# Patient Record
Sex: Female | Born: 1955 | Race: White | Hispanic: No | Marital: Married | Smoking: Former smoker
Health system: Southern US, Community
[De-identification: ages and names within clinical notes are randomized; demographics above are authoritative.]

## PROBLEM LIST (undated history)

## (undated) DIAGNOSIS — J45909 Unspecified asthma, uncomplicated: Secondary | ICD-10-CM

## (undated) DIAGNOSIS — Z1239 Encounter for other screening for malignant neoplasm of breast: Secondary | ICD-10-CM

## (undated) DIAGNOSIS — E669 Obesity, unspecified: Secondary | ICD-10-CM

## (undated) DIAGNOSIS — Z1211 Encounter for screening for malignant neoplasm of colon: Secondary | ICD-10-CM

## (undated) DIAGNOSIS — D72829 Elevated white blood cell count, unspecified: Secondary | ICD-10-CM

## (undated) DIAGNOSIS — Z9889 Other specified postprocedural states: Secondary | ICD-10-CM

## (undated) DIAGNOSIS — R739 Hyperglycemia, unspecified: Secondary | ICD-10-CM

## (undated) DIAGNOSIS — F17201 Nicotine dependence, unspecified, in remission: Secondary | ICD-10-CM

## (undated) DIAGNOSIS — I10 Essential (primary) hypertension: Secondary | ICD-10-CM

## (undated) DIAGNOSIS — F32A Depression, unspecified: Secondary | ICD-10-CM

## (undated) DIAGNOSIS — E785 Hyperlipidemia, unspecified: Secondary | ICD-10-CM

## (undated) DIAGNOSIS — J449 Chronic obstructive pulmonary disease, unspecified: Secondary | ICD-10-CM

## (undated) DIAGNOSIS — F329 Major depressive disorder, single episode, unspecified: Secondary | ICD-10-CM

## (undated) DIAGNOSIS — F419 Anxiety disorder, unspecified: Secondary | ICD-10-CM

## (undated) DIAGNOSIS — K219 Gastro-esophageal reflux disease without esophagitis: Secondary | ICD-10-CM

## (undated) HISTORY — DX: Hyperlipidemia, unspecified: E78.5

## (undated) HISTORY — DX: Nicotine dependence, unspecified, in remission: F17.201

## (undated) HISTORY — DX: Chronic obstructive pulmonary disease, unspecified: J44.9

## (undated) HISTORY — PX: ABDOMINAL HYSTERECTOMY: SHX81

## (undated) HISTORY — DX: Depression, unspecified: F32.A

## (undated) HISTORY — DX: Essential (primary) hypertension: I10

## (undated) HISTORY — DX: Elevated white blood cell count, unspecified: D72.829

## (undated) HISTORY — DX: Major depressive disorder, single episode, unspecified: F32.9

## (undated) HISTORY — DX: Encounter for screening for malignant neoplasm of colon: Z12.11

## (undated) HISTORY — DX: Anxiety disorder, unspecified: F41.9

## (undated) HISTORY — PX: ABDOMINAL HYSTERECTOMY: SUR658

## (undated) HISTORY — PX: HEMORRHOID SURGERY: SHX153

## (undated) HISTORY — DX: Encounter for other screening for malignant neoplasm of breast: Z12.39

## (undated) HISTORY — DX: Other specified postprocedural states: Z98.890

## (undated) HISTORY — DX: Gastro-esophageal reflux disease without esophagitis: K21.9

## (undated) HISTORY — DX: Obesity, unspecified: E66.9

## (undated) HISTORY — DX: Hyperglycemia, unspecified: R73.9

---

## 1993-09-08 HISTORY — PX: BILATERAL OOPHORECTOMY: SHX1221

## 2005-08-22 ENCOUNTER — Ambulatory Visit: Payer: Self-pay | Admitting: General Surgery

## 2005-08-26 ENCOUNTER — Ambulatory Visit: Payer: Self-pay | Admitting: General Surgery

## 2006-06-17 ENCOUNTER — Observation Stay: Payer: Self-pay | Admitting: Internal Medicine

## 2006-06-17 ENCOUNTER — Other Ambulatory Visit: Payer: Self-pay

## 2006-07-02 ENCOUNTER — Ambulatory Visit: Payer: Self-pay | Admitting: Internal Medicine

## 2006-07-09 ENCOUNTER — Ambulatory Visit: Payer: Self-pay | Admitting: Internal Medicine

## 2006-07-21 ENCOUNTER — Emergency Department: Payer: Self-pay | Admitting: Emergency Medicine

## 2006-09-18 ENCOUNTER — Other Ambulatory Visit: Payer: Self-pay

## 2006-09-18 ENCOUNTER — Emergency Department: Payer: Self-pay | Admitting: Internal Medicine

## 2006-10-02 ENCOUNTER — Ambulatory Visit: Payer: Self-pay | Admitting: Internal Medicine

## 2007-10-14 ENCOUNTER — Ambulatory Visit: Payer: Self-pay | Admitting: Internal Medicine

## 2009-08-18 ENCOUNTER — Ambulatory Visit: Payer: Self-pay | Admitting: Internal Medicine

## 2009-12-29 ENCOUNTER — Emergency Department: Payer: Self-pay | Admitting: Emergency Medicine

## 2010-02-06 DIAGNOSIS — Z1211 Encounter for screening for malignant neoplasm of colon: Secondary | ICD-10-CM

## 2010-02-06 HISTORY — DX: Encounter for screening for malignant neoplasm of colon: Z12.11

## 2010-02-08 ENCOUNTER — Ambulatory Visit: Payer: Self-pay

## 2010-02-11 ENCOUNTER — Ambulatory Visit: Payer: Self-pay | Admitting: Unknown Physician Specialty

## 2010-02-12 ENCOUNTER — Ambulatory Visit: Payer: Self-pay | Admitting: Unknown Physician Specialty

## 2010-02-14 ENCOUNTER — Ambulatory Visit: Payer: Self-pay | Admitting: Unknown Physician Specialty

## 2010-02-21 ENCOUNTER — Ambulatory Visit: Payer: Self-pay | Admitting: Unknown Physician Specialty

## 2010-09-25 LAB — HM COLONOSCOPY

## 2010-11-07 DIAGNOSIS — Z9889 Other specified postprocedural states: Secondary | ICD-10-CM

## 2010-11-07 HISTORY — DX: Other specified postprocedural states: Z98.890

## 2010-11-21 ENCOUNTER — Observation Stay: Payer: Self-pay | Admitting: Internal Medicine

## 2010-11-21 ENCOUNTER — Encounter: Payer: Self-pay | Admitting: Cardiovascular Disease

## 2010-11-21 DIAGNOSIS — M79609 Pain in unspecified limb: Secondary | ICD-10-CM

## 2010-11-22 DIAGNOSIS — R079 Chest pain, unspecified: Secondary | ICD-10-CM

## 2010-11-22 HISTORY — PX: CARDIAC CATHETERIZATION: SHX172

## 2010-11-26 ENCOUNTER — Encounter: Payer: Self-pay | Admitting: Cardiovascular Disease

## 2010-12-02 ENCOUNTER — Ambulatory Visit (INDEPENDENT_AMBULATORY_CARE_PROVIDER_SITE_OTHER): Payer: 59 | Admitting: Cardiovascular Disease

## 2010-12-02 ENCOUNTER — Encounter: Payer: Self-pay | Admitting: Cardiovascular Disease

## 2010-12-02 DIAGNOSIS — I10 Essential (primary) hypertension: Secondary | ICD-10-CM | POA: Insufficient documentation

## 2010-12-02 DIAGNOSIS — I2510 Atherosclerotic heart disease of native coronary artery without angina pectoris: Secondary | ICD-10-CM | POA: Insufficient documentation

## 2010-12-02 DIAGNOSIS — F17200 Nicotine dependence, unspecified, uncomplicated: Secondary | ICD-10-CM | POA: Insufficient documentation

## 2010-12-02 DIAGNOSIS — E785 Hyperlipidemia, unspecified: Secondary | ICD-10-CM

## 2010-12-02 MED ORDER — ROSUVASTATIN CALCIUM 40 MG PO TABS: 40.0000 mg | ORAL_TABLET | Freq: Every day | ORAL | Status: DC

## 2010-12-02 NOTE — Patient Instructions (Signed)
Stop simvastatin when you run out of medication. Change to crestor 40 mg daily. Check cholesterol in three months. Follow up in clinic in 1 year or earlier if new issues arise.

## 2010-12-02 NOTE — Assessment & Plan Note (Signed)
She does have diffuse coronary artery disease. We have recommended smoking cessation, weight loss, exercise, improved lipid control.

## 2010-12-02 NOTE — Progress Notes (Signed)
   Patient ID: Catherine [name], female    DOB: 29-Dec-1955, 55 y.o.   MRN: [id number]  HPI Comments: Catherine [name] is a very pleasant 22 showed woman with COPD, hyperlipidemia, hypertension, long smoking history who works at [employer] who presented to the hospital with neck and shoulder pain, elevated cardiac enzymes, status post cardiac catheterization who presents for routine followup and to establish care in our office.  She had her cardiac catheterization last week while in the hospital on November 22 2010.  She had mild diffuse atherosclerotic disease with tapering of the mid to distal LAD from a moderate-sized vessel to a small vessel distally, she had several small to moderate sized OM vessels, ejection fraction 55%, no disease in the runoff of the right external iliac and right common femoral.  Since her discharge, she has felt well. She denies any chest pain. She does continue to have some chest and shoulder pain. She cannot take pain medication as she is "allergic".   EKG today shows normal sinus rhythm with rate 88 beats per minute, no significant ST or T wave changes.     Review of Systems  Constitutional: Negative.   HENT: Negative.   Eyes: Negative.   Respiratory: Negative.   Cardiovascular: Negative.   Gastrointestinal: Negative.   Musculoskeletal: Negative.   Skin: Negative.   Neurological: Negative.   Hematological: Negative.   Psychiatric/Behavioral: Negative.   All other systems reviewed and are negative.   BP 142/78  Pulse 88  Ht 5\' 3"  (1.6 m)  Wt 221 lb 6.4 oz (100.426 kg)  BMI 39.22 kg/m2    Physical Exam  Nursing note and vitals reviewed. Constitutional: She is oriented to person, place, and time. She appears well-developed and well-nourished.       Obese  HENT:  Head: Normocephalic.  Nose: Nose normal.  Mouth/Throat: Oropharynx is clear and moist.  Eyes: Conjunctivae are normal. Pupils are equal, round, and reactive to light.  Neck: Normal range of  motion. Neck supple. No JVD present.  Cardiovascular: Normal rate, regular rhythm, normal heart sounds and intact distal pulses.  Exam reveals no gallop and no friction rub.   No murmur heard. Pulmonary/Chest: Effort normal and breath sounds normal. No respiratory distress. She has no wheezes. She has no rales. She exhibits no tenderness.  Abdominal: Soft. Bowel sounds are normal. She exhibits no distension. There is no tenderness.  Musculoskeletal: Normal range of motion. She exhibits no edema and no tenderness.  Lymphadenopathy:    She has no cervical adenopathy.  Neurological: She is alert and oriented to person, place, and time. Coordination normal.  Skin: Skin is warm and dry. No rash noted. No erythema.  Psychiatric: She has a normal mood and affect. Her behavior is normal. Judgment and thought content normal.     Assessment and Plan

## 2010-12-02 NOTE — Assessment & Plan Note (Signed)
We have strongly recommended she quit smoking. She reports that her smoking patches will be covered by her insurance company.

## 2010-12-02 NOTE — Assessment & Plan Note (Signed)
I asked her to monitor her blood pressure. It is reasonably well controlled on today's visit. No medication changes were made

## 2010-12-02 NOTE — Assessment & Plan Note (Addendum)
Goal LDL should be less than 70 given her mild diffuse coronary artery disease and numerous risk factors. We have suggested she change to either Lipitor 80 mg or Crestor 40 mg. Lipitor appears to be a tier 3 medication for her whereas Crestor is a tier to medication.

## 2011-06-28 ENCOUNTER — Inpatient Hospital Stay: Payer: Self-pay | Admitting: Internal Medicine

## 2011-07-15 ENCOUNTER — Ambulatory Visit: Payer: Self-pay | Admitting: Internal Medicine

## 2011-07-28 ENCOUNTER — Ambulatory Visit: Payer: Self-pay | Admitting: General Practice

## 2011-07-28 DIAGNOSIS — Z1239 Encounter for other screening for malignant neoplasm of breast: Secondary | ICD-10-CM

## 2011-07-28 HISTORY — DX: Encounter for other screening for malignant neoplasm of breast: Z12.39

## 2011-08-01 ENCOUNTER — Ambulatory Visit (INDEPENDENT_AMBULATORY_CARE_PROVIDER_SITE_OTHER): Payer: 59 | Admitting: Internal Medicine

## 2011-08-01 ENCOUNTER — Encounter: Payer: Self-pay | Admitting: Internal Medicine

## 2011-08-01 VITALS — BP 148/82 | HR 93 | Temp 98.2°F | Resp 18 | Ht 63.0 in | Wt 227.5 lb

## 2011-08-01 DIAGNOSIS — Z1211 Encounter for screening for malignant neoplasm of colon: Secondary | ICD-10-CM | POA: Insufficient documentation

## 2011-08-01 DIAGNOSIS — J449 Chronic obstructive pulmonary disease, unspecified: Secondary | ICD-10-CM

## 2011-08-01 DIAGNOSIS — Z1239 Encounter for other screening for malignant neoplasm of breast: Secondary | ICD-10-CM | POA: Insufficient documentation

## 2011-08-01 DIAGNOSIS — K2990 Gastroduodenitis, unspecified, without bleeding: Secondary | ICD-10-CM | POA: Insufficient documentation

## 2011-08-01 DIAGNOSIS — F17201 Nicotine dependence, unspecified, in remission: Secondary | ICD-10-CM | POA: Insufficient documentation

## 2011-08-01 MED ORDER — METHYLPREDNISOLONE ACETATE 80 MG/ML IJ SUSP
80.0000 mg | Freq: Once | INTRAMUSCULAR | Status: AC
  Administered 2011-08-01: 80 mg via INTRAMUSCULAR

## 2011-08-01 MED ORDER — LEVOFLOXACIN 750 MG PO TABS: 750.0000 mg | ORAL_TABLET | Freq: Every day | ORAL | Status: DC

## 2011-08-01 MED ORDER — PREDNISONE (PAK) 10 MG PO TABS: ORAL_TABLET | ORAL | Status: DC

## 2011-08-01 MED ORDER — INSULIN ASPART 100 UNIT/ML ~~LOC~~ SOLN: 3.0000 [IU] | Freq: Three times a day (TID) | SUBCUTANEOUS | Status: DC

## 2011-08-01 MED ORDER — GLIPIZIDE 5 MG PO TABS: 5.0000 mg | ORAL_TABLET | Freq: Two times a day (BID) | ORAL | Status: DC

## 2011-08-01 MED ORDER — FLUCONAZOLE 150 MG PO TABS: 150.0000 mg | ORAL_TABLET | Freq: Once | ORAL | Status: DC

## 2011-08-01 NOTE — Patient Instructions (Addendum)
Increase glipizide to 5 mg to twice daily,  Add a dose before dinner.    Use the Novalog insulin before your meals using the following sliding scale:  For CBG 100 to 150:     No novolog  For cbg  151 to 200:     3 units novolog  For cbg 201 to 250:      6 units novolog   For cbg  251 to 300:     10 units novolog  For cbg 301 to 350:       15 units   You received 80 mg of solumedrol today.  Start the prednisone taper tomorrow.    I M going to prescribe Levaquin for your antibiotic.

## 2011-08-01 NOTE — Progress Notes (Signed)
Subjective:    Patient ID: Catherine [name], female    DOB: 02-Aug-1956, 55 y.o.   MRN: [id number]  HPI  Catherine [name] is a 55 yo  Employee of the [employer] [facility] who is transferring care from [employer].  She was recently hospitalized with acute respiratory failure secondary to COPD exacerbation and was discharge home on supplemental 02 for use with ambulation.  She is not wearing it today.  She has quit smoking since her hospitalization now takes Lantus for management of her diabetes mellitus due to significant elevation secondary to use of prednisone. Her home cbgs have been averaging 140-fasting and 150 post prandial.   For the last several days she has been more dyspneic than usual and has had a productive cough.  No fevers or chills, and no chest pain. Past Medical History  Diagnosis Date  . Diabetes mellitus   . Leukocytosis   . Hyperlipidemia   . COPD (chronic obstructive pulmonary disease)   . Hyperglycemia   . Hypertension   . S/P cardiac catheterization March 2012    normal coronaries,  due to chest pain ([facility])  . Tobacco abuse, in remission     quit oct during hospitalization   . Screening for colon cancer June 2011    polyps found, next one due 2014 ([facility])  . Gastritis and duodenitis June 2011    EGD  . Screening for breast cancer Jul 28 2011    normal   Current Outpatient Prescriptions on File Prior to Visit  Medication Sig Dispense Refill  . albuterol-ipratropium (COMBIVENT) 18-103 MCG/ACT inhaler Inhale 2 puffs into the lungs every 6 (six) hours as needed.        Marland Kitchen aspirin 81 MG EC tablet Take 81 mg by mouth daily.        Marland Kitchen ipratropium-albuterol (DUONEB) 0.5-2.5 (3) MG/3ML SOLN Take 3 mLs by nebulization every 4 (four) hours as needed.        .cur  Review of Systems  Constitutional: Negative for fever, chills and unexpected weight change.  HENT: Negative for hearing loss, ear pain, nosebleeds, congestion, sore throat, facial swelling, rhinorrhea, sneezing, mouth  sores, trouble swallowing, neck pain, neck stiffness, voice change, postnasal drip, sinus pressure, tinnitus and ear discharge.   Eyes: Negative for pain, discharge, redness and visual disturbance.  Respiratory: Positive for cough, chest tightness, shortness of breath and wheezing. Negative for stridor.   Cardiovascular: Negative for chest pain, palpitations and leg swelling.  Musculoskeletal: Negative for myalgias and arthralgias.  Skin: Negative for color change and rash.  Neurological: Negative for dizziness, weakness, light-headedness and headaches.  Hematological: Negative for adenopathy.   BP 148/82  Pulse 93  Temp(Src) 98.2 F (36.8 C) (Oral)  Resp 18  Ht 5\' 3"  (1.6 m)  Wt 227 lb 8 oz (103.193 kg)  BMI 40.30 kg/m2  SpO2 90%     Objective:   Physical Exam  Constitutional: She is oriented to person, place, and time. She appears well-developed and well-nourished.  HENT:  Mouth/Throat: Oropharynx is clear and moist.  Eyes: EOM are normal. Pupils are equal, round, and reactive to light. No scleral icterus.  Neck: Normal range of motion. Neck supple. No JVD present. No thyromegaly present.  Cardiovascular: Normal rate, regular rhythm, normal heart sounds and intact distal pulses.   Pulmonary/Chest: Effort normal. She has wheezes.  Abdominal: Soft. Bowel sounds are normal. She exhibits no mass. There is no tenderness.  Musculoskeletal: Normal range of motion. She exhibits no edema.  Lymphadenopathy:  She has no cervical adenopathy.  Neurological: She is alert and oriented to person, place, and time.  Skin: Skin is warm and dry.  Psychiatric: She has a normal mood and affect.       Assessment & Plan:  COPD exacerbation:  will treat empirically with levaquin, steroids, and supplemental 02. She will return next week for followup.  DM:  I expect her to have hyperglycemia with prednisone use and have given her a 70/30 Novolog flexpen with a sliding scale to use.   Tobacco  abuse:  She appears determined to remain abstinent given her recent hospitalization and her mother's history of respiratory failure.

## 2011-08-02 ENCOUNTER — Encounter: Payer: Self-pay | Admitting: Internal Medicine

## 2011-08-07 ENCOUNTER — Telehealth: Payer: Self-pay | Admitting: Internal Medicine

## 2011-08-07 ENCOUNTER — Encounter: Payer: Self-pay | Admitting: Internal Medicine

## 2011-08-07 ENCOUNTER — Ambulatory Visit (INDEPENDENT_AMBULATORY_CARE_PROVIDER_SITE_OTHER): Payer: 59 | Admitting: Internal Medicine

## 2011-08-07 DIAGNOSIS — IMO0001 Reserved for inherently not codable concepts without codable children: Secondary | ICD-10-CM

## 2011-08-07 DIAGNOSIS — E1165 Type 2 diabetes mellitus with hyperglycemia: Secondary | ICD-10-CM

## 2011-08-07 DIAGNOSIS — IMO0002 Reserved for concepts with insufficient information to code with codable children: Secondary | ICD-10-CM

## 2011-08-07 DIAGNOSIS — J441 Chronic obstructive pulmonary disease with (acute) exacerbation: Secondary | ICD-10-CM

## 2011-08-07 NOTE — Progress Notes (Signed)
Subjective:    Patient ID: Catherine [name], female    DOB: 1955-12-04, 55 y.o.   MRN: [id number]  HPI  Ms. Lordi returns for one week followup on COPD exacerbation which was  treated with levaquin and prednisone. She feels much better and has been compliant wtith all medicatiosn and constinues to refrain from tobacco use.  Her cbgs were elevated due to illness and sterid use, but have been improving and she is using sliding scale for cbgs > 200.  Past Medical History  Diagnosis Date  . Diabetes mellitus   . Leukocytosis   . Hyperlipidemia   . COPD (chronic obstructive pulmonary disease)   . Hyperglycemia   . Hypertension   . S/P cardiac catheterization March 2012    normal coronaries,  due to chest pain ([facility])  . Tobacco abuse, in remission     quit oct during hospitalization   . Screening for colon cancer June 2011    polyps found, next one due 2014 ([facility])  . Gastritis and duodenitis June 2011    EGD  . Screening for breast cancer Jul 28 2011    normal  . GERD (gastroesophageal reflux disease)   . Obesity (BMI 30-39.9)   . Depression   . Anxiety    Current Outpatient Prescriptions on File Prior to Visit  Medication Sig Dispense Refill  . albuterol-ipratropium (COMBIVENT) 18-103 MCG/ACT inhaler Inhale 2 puffs into the lungs every 6 (six) hours as needed.        . ALPRAZolam (XANAX) 0.25 MG tablet Take 0.25 mg by mouth at bedtime as needed.        Marland Kitchen aspirin 81 MG EC tablet Take 81 mg by mouth daily.        Marland Kitchen atorvastatin (LIPITOR) 40 MG tablet Take 40 mg by mouth daily.        . Fluticasone-Salmeterol (ADVAIR) 250-50 MCG/DOSE AEPB Inhale 1 puff into the lungs every 12 (twelve) hours.        Marland Kitchen glipiZIDE (GLUCOTROL) 5 MG tablet Take 1 tablet (5 mg total) by mouth 2 (two) times daily.  60 tablet  11  . insulin aspart (NOVOLOG) 100 UNIT/ML injection Inject 3 Units into the skin 3 (three) times daily before meals.  1 vial  12  . insulin glargine (LANTUS) 100 UNIT/ML  injection Inject 20 Units into the skin at bedtime.        Marland Kitchen ipratropium-albuterol (DUONEB) 0.5-2.5 (3) MG/3ML SOLN Take 3 mLs by nebulization every 4 (four) hours as needed.        . pantoprazole (PROTONIX) 40 MG tablet Take 40 mg by mouth daily.        Marland Kitchen PARoxetine (PAXIL) 10 MG tablet Take 10 mg by mouth every morning.        . theophylline (THEODUR) 200 MG 12 hr tablet Take 200 mg by mouth 2 (two) times daily.        Marland Kitchen tiotropium (SPIRIVA) 18 MCG inhalation capsule Place 18 mcg into inhaler and inhale daily.           Review of Systems  Constitutional: Negative for fever, chills and unexpected weight change.  HENT: Negative for hearing loss, ear pain, nosebleeds, congestion, sore throat, facial swelling, rhinorrhea, sneezing, mouth sores, trouble swallowing, neck pain, neck stiffness, voice change, postnasal drip, sinus pressure, tinnitus and ear discharge.   Eyes: Negative for pain, discharge, redness and visual disturbance.  Respiratory: Negative for cough, chest tightness, shortness of breath, wheezing and stridor.   Cardiovascular:  Negative for chest pain, palpitations and leg swelling.  Musculoskeletal: Negative for myalgias and arthralgias.  Skin: Negative for color change and rash.  Neurological: Negative for dizziness, weakness, light-headedness and headaches.  Hematological: Negative for adenopathy.   BP 124/78  Pulse 97  Temp(Src) 98.2 F (36.8 C) (Oral)  Resp 18  Ht 5\' 3"  (1.6 m)  Wt 223 lb 8 oz (101.379 kg)  BMI 39.59 kg/m2  SpO2 92%     Objective:   Physical Exam  Constitutional: She is oriented to person, place, and time. She appears well-developed and well-nourished.  HENT:  Mouth/Throat: Oropharynx is clear and moist.  Eyes: EOM are normal. Pupils are equal, round, and reactive to light. No scleral icterus.  Neck: Normal range of motion. Neck supple. No JVD present. No thyromegaly present.  Cardiovascular: Normal rate, regular rhythm, normal heart sounds and  intact distal pulses.   Pulmonary/Chest: Effort normal and breath sounds normal. She has no wheezes. She has no rales.  Abdominal: Soft. Bowel sounds are normal. She exhibits no mass. There is no tenderness.  Musculoskeletal: Normal range of motion. She exhibits no edema.  Lymphadenopathy:    She has no cervical adenopathy.  Neurological: She is alert and oriented to person, place, and time.  Skin: Skin is warm and dry.  Psychiatric: She has a normal mood and affect.       Assessment & Plan:   Diabetes mellitus type 2, uncontrolled Hgba1c was 9.3 during Oct 2012 hospitalization.  CBGS were improving at time of last visit with change in insulin regimen but have been elevated by recent  steroid use for treatment of COPD exacerbation.  Reviewed recent cbgs and new sliding scale designed with goal of adding oral meds to replace 70/30 eventually.  COPD exacerbation She was treated with levaquin and steorid taper last week when she presented with hypoxia, cough and mild wheezing.  Today's exam is considerably improved.     Updated Medication List Outpatient Encounter Prescriptions as of 08/07/2011  Medication Sig Dispense Refill  . albuterol-ipratropium (COMBIVENT) 18-103 MCG/ACT inhaler Inhale 2 puffs into the lungs every 6 (six) hours as needed.        . ALPRAZolam (XANAX) 0.25 MG tablet Take 0.25 mg by mouth at bedtime as needed.        Marland Kitchen aspirin 81 MG EC tablet Take 81 mg by mouth daily.        Marland Kitchen atorvastatin (LIPITOR) 40 MG tablet Take 40 mg by mouth daily.        . Fluticasone-Salmeterol (ADVAIR) 250-50 MCG/DOSE AEPB Inhale 1 puff into the lungs every 12 (twelve) hours.        Marland Kitchen glipiZIDE (GLUCOTROL) 5 MG tablet Take 1 tablet (5 mg total) by mouth 2 (two) times daily.  60 tablet  11  . insulin aspart (NOVOLOG) 100 UNIT/ML injection Inject 3 Units into the skin 3 (three) times daily before meals.  1 vial  12  . insulin glargine (LANTUS) 100 UNIT/ML injection Inject 20 Units into the  skin at bedtime.        Marland Kitchen ipratropium-albuterol (DUONEB) 0.5-2.5 (3) MG/3ML SOLN Take 3 mLs by nebulization every 4 (four) hours as needed.        . pantoprazole (PROTONIX) 40 MG tablet Take 40 mg by mouth daily.        Marland Kitchen PARoxetine (PAXIL) 10 MG tablet Take 10 mg by mouth every morning.        . theophylline (THEODUR) 200 MG 12 hr  tablet Take 200 mg by mouth 2 (two) times daily.        Marland Kitchen tiotropium (SPIRIVA) 18 MCG inhalation capsule Place 18 mcg into inhaler and inhale daily.        Marland Kitchen DISCONTD: glipiZIDE (GLUCOTROL) 5 MG tablet Take 5 mg by mouth 2 (two) times daily before a meal.        . DISCONTD: levofloxacin (LEVAQUIN) 750 MG tablet Take 1 tablet (750 mg total) by mouth daily.  7 tablet  0  . DISCONTD: predniSONE (STERAPRED UNI-PAK) 10 MG tablet 6 tablets on Day 1, decrease by 1 tablet daily until gone  21 tablet  0

## 2011-08-07 NOTE — Patient Instructions (Signed)
Start taking the glipizide twice daily 5 mg before meals.  Continue the 20 units of lantus  For cbg 150 to 200:  No insulin For cbg 201 to 250:  3 units

## 2011-08-07 NOTE — Telephone Encounter (Signed)
FAX # for FMLA paperwork [phone]

## 2011-08-09 ENCOUNTER — Ambulatory Visit: Payer: Self-pay | Admitting: Internal Medicine

## 2011-08-10 ENCOUNTER — Encounter: Payer: Self-pay | Admitting: Internal Medicine

## 2011-08-10 DIAGNOSIS — E669 Obesity, unspecified: Secondary | ICD-10-CM | POA: Insufficient documentation

## 2011-08-10 DIAGNOSIS — IMO0002 Reserved for concepts with insufficient information to code with codable children: Secondary | ICD-10-CM | POA: Insufficient documentation

## 2011-08-10 DIAGNOSIS — E1165 Type 2 diabetes mellitus with hyperglycemia: Secondary | ICD-10-CM | POA: Insufficient documentation

## 2011-08-10 DIAGNOSIS — J441 Chronic obstructive pulmonary disease with (acute) exacerbation: Secondary | ICD-10-CM | POA: Insufficient documentation

## 2011-08-10 DIAGNOSIS — F419 Anxiety disorder, unspecified: Secondary | ICD-10-CM | POA: Insufficient documentation

## 2011-08-10 DIAGNOSIS — F329 Major depressive disorder, single episode, unspecified: Secondary | ICD-10-CM | POA: Insufficient documentation

## 2011-08-10 DIAGNOSIS — K219 Gastro-esophageal reflux disease without esophagitis: Secondary | ICD-10-CM | POA: Insufficient documentation

## 2011-08-10 HISTORY — DX: Chronic obstructive pulmonary disease with (acute) exacerbation: J44.1

## 2011-08-10 NOTE — Assessment & Plan Note (Signed)
Hgba1c was 9.3 during Oct 2012 hospitalization.  CBGS were improving at time of last visit with change in insulin regimen but have been elevated by recent  steroid use for treatment of COPD exacerbation.  Reviewed recent cbgs and new sliding scale designed with goal of adding oral meds to replace 70/30 eventually.

## 2011-08-10 NOTE — Assessment & Plan Note (Signed)
She was treated with levaquin and steorid taper last week when she presented with hypoxia, cough and mild wheezing.  Today's exam is considerably improved.

## 2011-08-12 NOTE — Telephone Encounter (Signed)
I have the fax # on the paperwork

## 2011-08-13 ENCOUNTER — Ambulatory Visit: Payer: 59 | Admitting: Internal Medicine

## 2011-08-15 DIAGNOSIS — Z0279 Encounter for issue of other medical certificate: Secondary | ICD-10-CM

## 2011-09-09 ENCOUNTER — Ambulatory Visit: Payer: Self-pay | Admitting: Internal Medicine

## 2011-09-12 ENCOUNTER — Emergency Department: Payer: Self-pay | Admitting: Emergency Medicine

## 2011-09-12 LAB — COMPREHENSIVE METABOLIC PANEL
Anion Gap: 11 (ref 7–16)
Calcium, Total: 8.5 mg/dL (ref 8.5–10.1)
Chloride: 109 mmol/L — ABNORMAL HIGH (ref 98–107)
EGFR (African American): >60
Glucose: 149 mg/dL — ABNORMAL HIGH (ref 65–99)
Osmolality: 293 (ref 275–301)
Potassium: 3.9 mmol/L (ref 3.5–5.1)
SGOT(AST): 24 U/L (ref 15–37)
SGPT (ALT): 20 U/L
Sodium: 146 mmol/L — ABNORMAL HIGH (ref 136–145)

## 2011-09-12 LAB — CBC WITH DIFFERENTIAL/PLATELET
Basophil %: 0.5 %
Eosinophil #: 0.2 10*3/uL (ref 0.0–0.7)
Eosinophil %: 2.4 %
HCT: 40 % (ref 35.0–47.0)
Lymphocyte #: 3.1 10*3/uL (ref 1.0–3.6)
MCHC: 33.9 g/dL (ref 32.0–36.0)
MCV: 89 fL (ref 80–100)
Monocyte #: 0.5 10*3/uL (ref 0.0–0.7)
Monocyte %: 5.6 %
Neutrophil #: 5.9 10*3/uL (ref 1.4–6.5)
RBC: 4.52 10*6/uL (ref 3.80–5.20)
WBC: 9.8 10*3/uL (ref 3.6–11.0)

## 2011-09-12 LAB — TROPONIN I: Troponin-I: <0.02 ng/mL

## 2011-10-03 ENCOUNTER — Other Ambulatory Visit: Payer: Self-pay | Admitting: *Deleted

## 2011-10-03 MED ORDER — GLUCOSE BLOOD VI STRP: ORAL_STRIP | Status: DC

## 2011-10-03 MED ORDER — TIOTROPIUM BROMIDE MONOHYDRATE 18 MCG IN CAPS: 18.0000 ug | ORAL_CAPSULE | Freq: Every day | RESPIRATORY_TRACT | Status: DC

## 2011-11-07 ENCOUNTER — Encounter: Payer: Self-pay | Admitting: Internal Medicine

## 2011-11-07 ENCOUNTER — Ambulatory Visit (INDEPENDENT_AMBULATORY_CARE_PROVIDER_SITE_OTHER): Payer: PRIVATE HEALTH INSURANCE | Admitting: Internal Medicine

## 2011-11-07 DIAGNOSIS — J441 Chronic obstructive pulmonary disease with (acute) exacerbation: Secondary | ICD-10-CM

## 2011-11-07 DIAGNOSIS — E1165 Type 2 diabetes mellitus with hyperglycemia: Secondary | ICD-10-CM | POA: Insufficient documentation

## 2011-11-07 DIAGNOSIS — I2510 Atherosclerotic heart disease of native coronary artery without angina pectoris: Secondary | ICD-10-CM

## 2011-11-07 DIAGNOSIS — IMO0002 Reserved for concepts with insufficient information to code with codable children: Secondary | ICD-10-CM | POA: Insufficient documentation

## 2011-11-07 DIAGNOSIS — E118 Type 2 diabetes mellitus with unspecified complications: Secondary | ICD-10-CM

## 2011-11-07 DIAGNOSIS — J449 Chronic obstructive pulmonary disease, unspecified: Secondary | ICD-10-CM

## 2011-11-07 MED ORDER — AZITHROMYCIN 500 MG PO TABS: 500.0000 mg | ORAL_TABLET | Freq: Every day | ORAL | Status: AC

## 2011-11-07 MED ORDER — PREDNISONE (PAK) 10 MG PO TABS: ORAL_TABLET | ORAL | Status: AC

## 2011-11-07 MED ORDER — ALPRAZOLAM 0.25 MG PO TABS: 0.2500 mg | ORAL_TABLET | Freq: Every evening | ORAL | Status: DC | PRN

## 2011-11-07 NOTE — Assessment & Plan Note (Addendum)
Now congtrolled, hgba1c < 7.0  On Lantus 20 units and glipizide 5 mg bid.  No changes today. Continue statin , She is up to  Date in eye exams

## 2011-11-07 NOTE — Assessment & Plan Note (Addendum)
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  Nonproductive cough,  Afebrile  Wheezing only with forced exhalation.  Prednisone taper  And  azithromycin .  Referring to Dr. Kendrick Fries for pulmonology evaluation per patient request.

## 2011-11-07 NOTE — Progress Notes (Signed)
Subjective:    Patient ID: Catherine [name], female    DOB: [birth year], 56 y.o.   MRN: [id number]  HPI  Catherine [name] is a 56 yr old white female with a  History of DM, hyperlipidemia and COPD who returns for follow up on chronic conditions .  Her diabetes is much better controlled on current regimen. She denies any recent lows using lantus 20 and glipizide 5 mg bid .  She has been more short of breath,  depite using advair twice daily and spiriva.  She apparently had PFTS which were not completed and other tests by  [facility] have still not been resulted to patient (alpha 1 antitrypsin was drawn months ago ) .  Very dissatisfied.,  Having occasional episodes of chest pressure with no other associated sympotms occurring at rest.  Gave up smoking 5 months ago.   Increased cough without sputum for a few weeks,  Using albuterol every 4 hours.    Past Medical History  Diagnosis Date  . Diabetes mellitus   . Leukocytosis   . Hyperlipidemia   . COPD (chronic obstructive pulmonary disease)   . Hyperglycemia   . Hypertension   . S/P cardiac catheterization March 2012    normal coronaries,  due to chest pain ([facility])  . Tobacco abuse, in remission     quit oct during hospitalization   . Screening for colon cancer June 2011    polyps found, next one due 2014 ([facility])  . Gastritis and duodenitis June 2011    EGD  . Screening for breast cancer Jul 28 2011    normal  . GERD (gastroesophageal reflux disease)   . Obesity (BMI 30-39.9)   . Depression   . Anxiety    Current Outpatient Prescriptions on File Prior to Visit  Medication Sig Dispense Refill  . albuterol-ipratropium (COMBIVENT) 18-103 MCG/ACT inhaler Inhale 2 puffs into the lungs every 6 (six) hours as needed.        Marland Kitchen aspirin 81 MG EC tablet Take 81 mg by mouth daily.        Marland Kitchen atorvastatin (LIPITOR) 40 MG tablet Take 40 mg by mouth daily.        . Fluticasone-Salmeterol (ADVAIR) 250-50 MCG/DOSE AEPB Inhale 1 puff into the lungs every 12  (twelve) hours.        Marland Kitchen glipiZIDE (GLUCOTROL) 5 MG tablet Take 1 tablet (5 mg total) by mouth 2 (two) times daily.  60 tablet  11  . glucose blood (ACCU-CHEK SMARTVIEW) test strip Use as instructed  150 each  11  . insulin glargine (LANTUS) 100 UNIT/ML injection Inject 20 Units into the skin daily.       Marland Kitchen ipratropium-albuterol (DUONEB) 0.5-2.5 (3) MG/3ML SOLN Take 3 mLs by nebulization every 4 (four) hours as needed.        . pantoprazole (PROTONIX) 40 MG tablet Take 40 mg by mouth daily.        Marland Kitchen PARoxetine (PAXIL) 10 MG tablet Take 10 mg by mouth every morning.        . tiotropium (SPIRIVA) 18 MCG inhalation capsule Place 1 capsule (18 mcg total) into inhaler and inhale daily.  30 capsule  3    Review of Systems  Constitutional: Negative for fever, chills and unexpected weight change.  HENT: Negative for hearing loss, ear pain, nosebleeds, congestion, sore throat, facial swelling, rhinorrhea, sneezing, mouth sores, trouble swallowing, neck pain, neck stiffness, voice change, postnasal drip, sinus pressure, tinnitus and ear discharge.   Eyes: Negative  for pain, discharge, redness and visual disturbance.  Respiratory: Negative for cough, chest tightness, shortness of breath, wheezing and stridor.   Cardiovascular: Negative for chest pain, palpitations and leg swelling.  Musculoskeletal: Negative for myalgias and arthralgias.  Skin: Negative for color change and rash.  Neurological: Negative for dizziness, weakness, light-headedness and headaches.  Hematological: Negative for adenopathy.       Objective:   Physical Exam  Constitutional: She is oriented to person, place, and time. She appears well-developed and well-nourished.  HENT:  Mouth/Throat: Oropharynx is clear and moist.  Eyes: EOM are normal. Pupils are equal, round, and reactive to light. No scleral icterus.  Neck: Normal range of motion. Neck supple. No JVD present. No thyromegaly present.  Cardiovascular: Normal rate,  regular rhythm, normal heart sounds and intact distal pulses.   Pulmonary/Chest: Effort normal. She has wheezes.  Abdominal: Soft. Bowel sounds are normal. She exhibits no mass. There is no tenderness.  Musculoskeletal: Normal range of motion. She exhibits no edema.  Lymphadenopathy:    She has no cervical adenopathy.  Neurological: She is alert and oriented to person, place, and time.  Skin: Skin is warm and dry.  Psychiatric: She has a normal mood and affect.      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  Nonproductive cough,  Afebrile  Wheezing only with forced exhalation.  Prednisone taper  And  azithromycin .  Referring to Dr. Kendrick Fries for pulmonology evaluation per patient request.  Diabetes mellitus with complication Now congtrolled, hgba1c < 7.0  On Lantus 20 units and glipizide 5 mg bid.  No changes today. Continue statin , She is up to  Date in eye exams    Updated Medication List Outpatient Encounter Prescriptions as of 11/07/2011  Medication Sig Dispense Refill  . albuterol-ipratropium (COMBIVENT) 18-103 MCG/ACT inhaler Inhale 2 puffs into the lungs every 6 (six) hours as needed.        Marland Kitchen aspirin 81 MG EC tablet Take 81 mg by mouth daily.        Marland Kitchen atorvastatin (LIPITOR) 40 MG tablet Take 40 mg by mouth daily.        . Fluticasone-Salmeterol (ADVAIR) 250-50 MCG/DOSE AEPB Inhale 1 puff into the lungs every 12 (twelve) hours.        Marland Kitchen glipiZIDE (GLUCOTROL) 5 MG tablet Take 1 tablet (5 mg total) by mouth 2 (two) times daily.  60 tablet  11  . glucose blood (ACCU-CHEK SMARTVIEW) test strip Use as instructed  150 each  11  . insulin glargine (LANTUS) 100 UNIT/ML injection Inject 20 Units into the skin daily.       Marland Kitchen ipratropium-albuterol (DUONEB) 0.5-2.5 (3) MG/3ML SOLN Take 3 mLs by nebulization every 4 (four) hours as needed.        . pantoprazole (PROTONIX) 40 MG tablet Take 40 mg by mouth daily.        Marland Kitchen PARoxetine (PAXIL) 10 MG tablet  Take 10 mg by mouth every morning.        . tiotropium (SPIRIVA) 18 MCG inhalation capsule Place 1 capsule (18 mcg total) into inhaler and inhale daily.  30 capsule  3  . ALPRAZolam (XANAX) 0.25 MG tablet Take 1 tablet (0.25 mg total) by mouth at bedtime as needed.  30 tablet  2  . azithromycin (ZITHROMAX) 500 MG tablet Take 1 tablet (500 mg total) by mouth daily.  7 tablet  0  . predniSONE (STERAPRED UNI-PAK) 10 MG  tablet 6 tablets on Day 1 , then reduce by 1 tablet daily until gone  21 tablet  0  . DISCONTD: ALPRAZolam (XANAX) 0.25 MG tablet Take 0.25 mg by mouth at bedtime as needed.        Marland Kitchen DISCONTD: insulin aspart (NOVOLOG) 100 UNIT/ML injection Inject 3 Units into the skin 3 (three) times daily before meals.  1 vial  12  . DISCONTD: theophylline (THEODUR) 200 MG 12 hr tablet Take 200 mg by mouth 2 (two) times daily.

## 2011-11-07 NOTE — Patient Instructions (Addendum)
While you are on the prednisone,  You can use th enovlog flex pen on a sliding scale for high sugars:  150 to 200   3 units 201 to 250   5 units 251 to 300   8 units  Higher than that call me  Prednisone taper and azithromycin for 6 days   Referral to [facility] under way  We'll make appt to see Gollan  Fasting labs on Tuesday

## 2011-11-09 ENCOUNTER — Encounter: Payer: Self-pay | Admitting: Internal Medicine

## 2011-11-10 ENCOUNTER — Institutional Professional Consult (permissible substitution): Payer: PRIVATE HEALTH INSURANCE | Admitting: Pulmonary Disease

## 2011-11-11 ENCOUNTER — Telehealth: Payer: Self-pay | Admitting: Internal Medicine

## 2011-11-11 ENCOUNTER — Other Ambulatory Visit: Payer: Self-pay | Admitting: Internal Medicine

## 2011-11-11 LAB — CBC WITH DIFFERENTIAL/PLATELET
Basophil #: 0 10*3/uL (ref 0.0–0.1)
Basophil %: 0.1 %
Eosinophil #: 0 10*3/uL (ref 0.0–0.7)
HGB: 14.3 g/dL (ref 12.0–16.0)
Lymphocyte %: 12.7 %
MCH: 28 pg (ref 26.0–34.0)
MCHC: 32.2 g/dL (ref 32.0–36.0)
Monocyte #: 0.2 10*3/uL (ref 0.0–0.7)
Neutrophil %: 85.3 %
Platelet: 340 10*3/uL (ref 150–440)
RBC: 5.11 10*6/uL (ref 3.80–5.20)
RDW: 11.9 % (ref 11.5–14.5)
WBC: 11.4 10*3/uL — ABNORMAL HIGH (ref 3.6–11.0)

## 2011-11-11 LAB — COMPREHENSIVE METABOLIC PANEL
Albumin: 3.8 g/dL (ref 3.4–5.0)
Alkaline Phosphatase: 135 U/L (ref 50–136)
Anion Gap: 10 (ref 7–16)
BUN: 15 mg/dL (ref 7–18)
Bilirubin,Total: 0.5 mg/dL (ref 0.2–1.0)
Chloride: 105 mmol/L (ref 98–107)
Creatinine: 0.89 mg/dL (ref 0.60–1.30)
EGFR (Non-African Amer.): >60
Glucose: 153 mg/dL — ABNORMAL HIGH (ref 65–99)
Osmolality: 283 (ref 275–301)
Potassium: 3.8 mmol/L (ref 3.5–5.1)
Sodium: 140 mmol/L (ref 136–145)
Total Protein: 7.9 g/dL (ref 6.4–8.2)

## 2011-11-11 LAB — LIPID PANEL
HDL Cholesterol: 47 mg/dL (ref 40–60)
Triglycerides: 166 mg/dL (ref 0–200)

## 2011-11-11 LAB — TSH: Thyroid Stimulating Horm: 0.312 u[IU]/mL — ABNORMAL LOW

## 2011-11-11 NOTE — Telephone Encounter (Signed)
h [phone]  Cell [phone] Pt came in today wanting to know The diabetes nurse was wondering if she should be put back on the ophylline for COPD since she is having a such a hard time breathing.  Also the xanax was not called in from 11/07/11 appointment  [facility] [phone]

## 2011-11-12 NOTE — Telephone Encounter (Signed)
Rx has been called in  

## 2011-11-12 NOTE — Telephone Encounter (Signed)
I would  Prefer not to start theophylline when she is about to see Dr Kendrick Fries as a new patient.  Has she received a call about an  appt with him yet?  The referral was placed on March 1

## 2011-11-13 ENCOUNTER — Telehealth: Payer: Self-pay | Admitting: Internal Medicine

## 2011-11-13 MED ORDER — ATORVASTATIN CALCIUM 80 MG PO TABS: 80.0000 mg | ORAL_TABLET | Freq: Every day | ORAL | Status: DC

## 2011-11-13 NOTE — Telephone Encounter (Signed)
Notified patient of results.  She stated she just had her HgbA1c done last month and it was 6.9  She stated if you wanted to see the actual report she would get the hospital to send you a copy.  Please advise

## 2011-11-13 NOTE — Telephone Encounter (Signed)
Her liver/kideny function were fine,  White count elevated, consistent with infection.  Cholesterol was a little elevated.  No hgba1c was done can she go back and get that done?  We will increase the atorvastatin to 80 mg to improve  the LDL of 105 to goal of 70

## 2011-11-13 NOTE — Telephone Encounter (Signed)
Notified patient of message, she has not received an appt with Dr. Kendrick Fries yet but I told her Catherine [name] is working on it. She is ok with this.

## 2011-11-13 NOTE — Telephone Encounter (Signed)
That would be nice, if the hosptial woudn send [place] a copy .Marland Kitchen But I believe her and will update chart.

## 2011-11-14 ENCOUNTER — Other Ambulatory Visit: Payer: Self-pay | Admitting: *Deleted

## 2011-11-14 MED ORDER — INSULIN GLARGINE 100 UNIT/ML ~~LOC~~ SOLN: 20.0000 [IU] | Freq: Every day | SUBCUTANEOUS | Status: DC

## 2011-11-25 ENCOUNTER — Ambulatory Visit (INDEPENDENT_AMBULATORY_CARE_PROVIDER_SITE_OTHER): Payer: PRIVATE HEALTH INSURANCE | Admitting: Internal Medicine

## 2011-11-25 ENCOUNTER — Encounter: Payer: Self-pay | Admitting: Internal Medicine

## 2011-11-25 VITALS — BP 130/78 | HR 92 | Temp 98.6°F | Wt 229.1 lb

## 2011-11-25 DIAGNOSIS — J441 Chronic obstructive pulmonary disease with (acute) exacerbation: Secondary | ICD-10-CM

## 2011-11-25 DIAGNOSIS — J069 Acute upper respiratory infection, unspecified: Secondary | ICD-10-CM

## 2011-11-25 MED ORDER — METHYLPREDNISOLONE ACETATE 40 MG/ML IJ SUSP
40.0000 mg | Freq: Once | INTRAMUSCULAR | Status: AC
  Administered 2011-11-25: 40 mg via INTRAMUSCULAR

## 2011-11-25 MED ORDER — PROMETHAZINE-PHENYLEPHRINE 6.25-5 MG/5ML PO SYRP: 5.0000 mL | ORAL_SOLUTION | ORAL | Status: DC | PRN

## 2011-11-25 MED ORDER — LEVOFLOXACIN 500 MG PO TABS: 500.0000 mg | ORAL_TABLET | Freq: Every day | ORAL | Status: AC

## 2011-11-25 NOTE — Assessment & Plan Note (Addendum)
She was treated 2 weeks ago with prednisone and azithromycin and her symptoms completely resolved for recurrence of symptoms 3 days ago it is indicative of a viral infection. She is not wheezing on exam except with forced exhalation. I would like to try to do without prednisone since she is not actively wheezing except with forced expiration. Dose of IM Depo-Medrol and we'll treat her for upper respiratory virus infection saline lavage Benadryl and Sudafed PE. We'll cover her empirically for her with Levaquin given her a significant COPD history. Dose of IM Depo-Medrol 40 mg was tolerated well .  She will continue use of home nebs and dilators as previously recommended

## 2011-11-25 NOTE — Patient Instructions (Addendum)
You have a viral  Syndrome .  The post nasal drip is causing your sore throat.  Lavage your sinuses twice daily with Simply saline nasal spray.(Do over the sink,, inhale deepply through eadh nostril while spraying )  Use benadryl 25 mg every 8 hours and Sudafed PE 10 to 30 every 8 hours to manage the drainage and congestion.  Gargle with salt water often for the sore throat.  I am calling in a cough syrup (without codeine) for the cough.  Levaquin once daily for 7 days  to cover any bacterial infection

## 2011-11-25 NOTE — Progress Notes (Signed)
Patient ID: Catherine [name], female   DOB: 1955-10-05, 56 y.o.   MRN: [id number]    Patient Active Problem List  Diagnoses  . CAD (coronary artery disease)  . HTN (hypertension)  . Hyperlipidemia  . Tobacco abuse, in remission  . Screening for colon cancer  . Gastritis and duodenitis  . Screening for breast cancer  . GERD (gastroesophageal reflux disease)  . Obesity (BMI 30-39.9)  . Depression  . Anxiety  . COPD exacerbation  . Diabetes mellitus with complication    Subjective:  CC:   Chief Complaint  Patient presents with  . Cough    congestion SOB    HPI:   Catherine Lish Whiteheadis a 56 y.o. female who presents with Recurrent cough,  sore throat,  malaise and shortness of breath.   Catherine [name] was last seen on March 1 at which time she was having a COPD exacerbation and was treated with prednisone and azithromycin. Her symptoms resolved and she will return to work. Within one week return to work she began having nausea without vomiting scratchy throat and chest pain with coughing. She denies any fevers. She has noted some shortness of breath with exertion but no wheezing. She has been using her albuterol nebs up to 4 times daily.    Past Medical History  Diagnosis Date  . Diabetes mellitus   . Leukocytosis   . Hyperlipidemia   . COPD (chronic obstructive pulmonary disease)   . Hyperglycemia   . Hypertension   . S/P cardiac catheterization March 2012    normal coronaries,  due to chest pain (Gollan)  . Tobacco abuse, in remission     quit oct during hospitalization   . Screening for colon cancer June 2011    polyps found, next one due 2014 ([facility])  . Gastritis and duodenitis June 2011    EGD  . Screening for breast cancer Jul 28 2011    normal  . GERD (gastroesophageal reflux disease)   . Obesity (BMI 30-39.9)   . Depression   . Anxiety     Past Surgical History  Procedure Date  . Cardiac catheterization 11/22/2010  . Abdominal hysterectomy   .  Hemorrhoid surgery   . Abdominal hysterectomy   . Bilateral oophorectomy 1995    and TAH.  noncancerous reasons       . methylPREDNISolone acetate  40 mg Intramuscular Once     The following portions of the patient's history were reviewed and updated as appropriate: Allergies, current medications, and problem list.    Review of Systems:   12 Pt  review of systems was negative except those addressed in the HPI,     History   Social History  . Marital Status: Married    Spouse Name: N/A    Number of Children: N/A  . Years of Education: N/A   Occupational History  . Not on file.   Social History Main Topics  . Smoking status: Former Smoker -- 40 years    Types: Cigarettes    Quit date: 06/27/2011  . Smokeless tobacco: Never Used  . Alcohol Use: No  . Drug Use: No  . Sexually Active: Not on file   Other Topics Concern  . Not on file   Social History Narrative  . No narrative on file    Objective:  BP 130/78  Pulse 92  Temp(Src) 98.6 F (37 C) (Oral)  Wt 229 lb 1.9 oz (103.928 kg)  SpO2 96%  General appearance: alert, cooperative  and appears stated age Ears: normal TM's and external ear canals both ears Throat: lips, mucosa, and tongue normal; teeth and gums normal Neck: no adenopathy, no carotid bruit, supple, symmetrical, trachea midline and thyroid not enlarged, symmetric, no tenderness/mass/nodules Back: symmetric, no curvature. ROM normal. No CVA tenderness. Lungs: clear to auscultation bilaterally Heart: regular rate and rhythm, S1, S2 normal, no murmur, click, rub or gallop Abdomen: soft, non-tender; bowel sounds normal; no masses,  no organomegaly Pulses: 2+ and symmetric Skin: Skin color, texture, turgor normal. No rashes or lesions Lymph nodes: Cervical, supraclavicular, and axillary nodes normal.  Assessment and Plan:  COPD exacerbation She was treated 2 weeks ago with prednisone and azithromycin and her symptoms completely resolved for  recurrence of symptoms 3 days ago it is indicative of a viral infection. She is not wheezing on exam except with forced exhalation. I would like to try to do without prednisone since she is not actively wheezing except with forced expiration. Dose of IM Depo-Medrol and we'll treat her for upper respiratory virus infection saline lavage Benadryl and Sudafed PE. We'll cover her empirically for her with Levaquin given her a significant COPD history. Dose of IM Depo-Medrol 40 mg was tolerated well .  She will continue use of home nebs and dilators as previously recommended    Updated Medication List Outpatient Encounter Prescriptions as of 11/25/2011  Medication Sig Dispense Refill  . albuterol-ipratropium (COMBIVENT) 18-103 MCG/ACT inhaler Inhale 2 puffs into the lungs every 6 (six) hours as needed.        . ALPRAZolam (XANAX) 0.25 MG tablet Take 1 tablet (0.25 mg total) by mouth at bedtime as needed.  30 tablet  2  . aspirin 81 MG EC tablet Take 81 mg by mouth daily.        Marland Kitchen atorvastatin (LIPITOR) 80 MG tablet Take 1 tablet (80 mg total) by mouth daily.  30 tablet  11  . Fluticasone-Salmeterol (ADVAIR) 250-50 MCG/DOSE AEPB Inhale 1 puff into the lungs every 12 (twelve) hours.        Marland Kitchen glipiZIDE (GLUCOTROL) 5 MG tablet Take 1 tablet (5 mg total) by mouth 2 (two) times daily.  60 tablet  11  . glucose blood (ACCU-CHEK SMARTVIEW) test strip Use as instructed  150 each  11  . insulin glargine (LANTUS) 100 UNIT/ML injection Inject 20 Units into the skin daily.  10 mL  5  . ipratropium-albuterol (DUONEB) 0.5-2.5 (3) MG/3ML SOLN Take 3 mLs by nebulization every 4 (four) hours as needed.        . pantoprazole (PROTONIX) 40 MG tablet Take 40 mg by mouth daily.        Marland Kitchen PARoxetine (PAXIL) 10 MG tablet Take 10 mg by mouth every morning.        . tiotropium (SPIRIVA) 18 MCG inhalation capsule Place 1 capsule (18 mcg total) into inhaler and inhale daily.  30 capsule  3  . levofloxacin (LEVAQUIN) 500 MG tablet  Take 1 tablet (500 mg total) by mouth daily.  7 tablet  0  . promethazine-phenylephrine (PROMETHAZINE-PHENYLEPHRINE) 6.25-5 MG/5ML SYRP Take 5 mLs by mouth every 4 (four) hours as needed.  280 mL  0   Facility-Administered Encounter Medications as of 11/25/2011  Medication Dose Route Frequency Provider Last Rate Last Dose  . methylPREDNISolone acetate (DEPO-MEDROL) injection 40 mg  40 mg Intramuscular Once Sherlene Shams, MD   40 mg at 11/25/11 1151     No orders of the defined types were placed in  this encounter.    No Follow-up on file.

## 2011-11-27 ENCOUNTER — Encounter: Payer: Self-pay | Admitting: *Deleted

## 2011-12-01 ENCOUNTER — Encounter: Payer: Self-pay | Admitting: Cardiovascular Disease

## 2011-12-01 ENCOUNTER — Ambulatory Visit (INDEPENDENT_AMBULATORY_CARE_PROVIDER_SITE_OTHER): Payer: PRIVATE HEALTH INSURANCE | Admitting: Cardiovascular Disease

## 2011-12-01 VITALS — BP 137/86 | HR 90 | Ht 63.0 in | Wt 228.0 lb

## 2011-12-01 DIAGNOSIS — E785 Hyperlipidemia, unspecified: Secondary | ICD-10-CM

## 2011-12-01 DIAGNOSIS — E118 Type 2 diabetes mellitus with unspecified complications: Secondary | ICD-10-CM

## 2011-12-01 DIAGNOSIS — I2510 Atherosclerotic heart disease of native coronary artery without angina pectoris: Secondary | ICD-10-CM

## 2011-12-01 DIAGNOSIS — I10 Essential (primary) hypertension: Secondary | ICD-10-CM

## 2011-12-01 NOTE — Assessment & Plan Note (Signed)
Blood pressure is well controlled on today's visit. No changes made to the medications. 

## 2011-12-01 NOTE — Progress Notes (Signed)
Patient ID: Catherine [name], female    DOB: 11-Apr-1956, 56 y.o.   MRN: [id number]  HPI Comments: Catherine [name] is a very pleasant 49 showed woman with COPD, hyperlipidemia, hypertension, long smoking history who works at [employer] who presented to the hospital with neck and shoulder pain in 2012, elevated cardiac enzymes, status post cardiac catheterization who presents for routine followup.  She had a cardiac catheterization while in the hospital on November 22 2010 showing  mild diffuse atherosclerotic disease with tapering of the mid to distal LAD from a moderate-sized vessel to a small vessel distally, she had several small to moderate sized OM vessels, ejection fraction 55%, no disease in the runoff of the right external iliac and right common femoral.  She has done well with her diabetes and diet. Hemoglobin A1c has decreased from about 9 to less than 7 .  overall she denies any significant chest pain. She has had significant coughing from upper respiratory tract infection and has been taking antibiotics through the winter. blood pressure at home is well controlled . Overall she has no new complaints.   EKG today shows normal sinus rhythm with rate 85 beats per minute, no significant ST or T wave changes.         Outpatient Encounter Prescriptions as of 12/01/2011  Medication Sig Dispense Refill  . albuterol-ipratropium (COMBIVENT) 18-103 MCG/ACT inhaler Inhale 2 puffs into the lungs every 6 (six) hours as needed.        . ALPRAZolam (XANAX) 0.25 MG tablet Take 1 tablet (0.25 mg total) by mouth at bedtime as needed.  30 tablet  2  . aspirin 81 MG EC tablet Take 81 mg by mouth daily.        Marland Kitchen atorvastatin (LIPITOR) 80 MG tablet Take 1 tablet (80 mg total) by mouth daily.  30 tablet  11  . Fluticasone-Salmeterol (ADVAIR) 250-50 MCG/DOSE AEPB Inhale 1 puff into the lungs every 12 (twelve) hours.        Marland Kitchen glipiZIDE (GLUCOTROL) 5 MG tablet Take 1 tablet (5 mg total) by mouth 2 (two) times  daily.  60 tablet  11  . glucose blood (ACCU-CHEK SMARTVIEW) test strip Use as instructed  150 each  11  . insulin glargine (LANTUS) 100 UNIT/ML injection Inject 20 Units into the skin daily.  10 mL  5  . Insulin Syringe-Needle U-100 (INSULIN SYRINGE .5CC/31GX5/16") 31G X 5/16" 0.5 ML MISC As directed      . ipratropium-albuterol (DUONEB) 0.5-2.5 (3) MG/3ML SOLN Take 3 mLs by nebulization every 4 (four) hours as needed.        . latanoprost (XALATAN) 0.005 % ophthalmic solution as directed.      Marland Kitchen levofloxacin (LEVAQUIN) 500 MG tablet Take 1 tablet (500 mg total) by mouth daily.  7 tablet  0  . pantoprazole (PROTONIX) 40 MG tablet Take 40 mg by mouth daily.        Marland Kitchen PARoxetine (PAXIL) 10 MG tablet Take 10 mg by mouth every morning.        . promethazine-phenylephrine (PROMETHAZINE-PHENYLEPHRINE) 6.25-5 MG/5ML SYRP Take 5 mLs by mouth every 4 (four) hours as needed.  280 mL  0  . tiotropium (SPIRIVA) 18 MCG inhalation capsule Place 1 capsule (18 mcg total) into inhaler and inhale daily.  30 capsule  3    Review of Systems  Constitutional: Negative.   HENT: Negative.   Eyes: Negative.   Respiratory: Positive for shortness of breath.   Cardiovascular: Negative.  Gastrointestinal: Negative.   Musculoskeletal: Negative.   Skin: Negative.   Neurological: Negative.   Hematological: Negative.   Psychiatric/Behavioral: Negative.   All other systems reviewed and are negative.    BP 137/86  Pulse 90  Ht 5\' 3"  (1.6 m)  Wt 228 lb (103.42 kg)  BMI 40.39 kg/m2  Physical Exam  Nursing note and vitals reviewed. Constitutional: She is oriented to person, place, and time. She appears well-developed and well-nourished.       Obese  HENT:  Head: Normocephalic.  Nose: Nose normal.  Mouth/Throat: Oropharynx is clear and moist.  Eyes: Conjunctivae are normal. Pupils are equal, round, and reactive to light.  Neck: Normal range of motion. Neck supple. No JVD present.  Cardiovascular: Normal rate,  regular rhythm, S1 normal, S2 normal, normal heart sounds and intact distal pulses.  Exam reveals no gallop and no friction rub.   No murmur heard. Pulmonary/Chest: Effort normal and breath sounds normal. No respiratory distress. She has no wheezes. She has no rales. She exhibits no tenderness.  Abdominal: Soft. Bowel sounds are normal. She exhibits no distension. There is no tenderness.  Musculoskeletal: Normal range of motion. She exhibits no edema and no tenderness.  Lymphadenopathy:    She has no cervical adenopathy.  Neurological: She is alert and oriented to person, place, and time. Coordination normal.  Skin: Skin is warm and dry. No rash noted. No erythema.  Psychiatric: She has a normal mood and affect. Her behavior is normal. Judgment and thought content normal.         Assessment and Plan

## 2011-12-01 NOTE — Assessment & Plan Note (Signed)
Hemoglobin A1c significantly improved, now 6.9. We have encouraged continued exercise, careful diet management in an effort to lose weight.

## 2011-12-01 NOTE — Assessment & Plan Note (Addendum)
Lipitor recently increased from 40 mg to 80 mg. goal LDL less than 70

## 2011-12-01 NOTE — Assessment & Plan Note (Signed)
Currently with no symptoms of angina. No further workup at this time. Continue current medication regimen. 

## 2011-12-01 NOTE — Patient Instructions (Signed)
You are doing well. No medication changes were made.  Please call us if you have new issues that need to be addressed before your next appt.  Your physician wants you to follow-up in: 12 months.  You will receive a reminder letter in the mail two months in advance. If you don't receive a letter, please call our office to schedule the follow-up appointment. 

## 2012-01-04 ENCOUNTER — Inpatient Hospital Stay: Payer: Self-pay | Admitting: Internal Medicine

## 2012-01-04 LAB — COMPREHENSIVE METABOLIC PANEL
Albumin: 3.3 g/dL — ABNORMAL LOW (ref 3.4–5.0)
Alkaline Phosphatase: 153 U/L — ABNORMAL HIGH (ref 50–136)
Anion Gap: 9 (ref 7–16)
Calcium, Total: 8.1 mg/dL — ABNORMAL LOW (ref 8.5–10.1)
Chloride: 106 mmol/L (ref 98–107)
Co2: 26 mmol/L (ref 21–32)
Creatinine: 0.84 mg/dL (ref 0.60–1.30)
EGFR (African American): >60
EGFR (Non-African Amer.): >60
Glucose: 184 mg/dL — ABNORMAL HIGH (ref 65–99)
Potassium: 3.5 mmol/L (ref 3.5–5.1)
SGPT (ALT): 25 U/L
Total Protein: 7.3 g/dL (ref 6.4–8.2)

## 2012-01-04 LAB — TROPONIN I
Troponin-I: <0.02 ng/mL
Troponin-I: <0.02 ng/mL

## 2012-01-04 LAB — TSH: Thyroid Stimulating Horm: 0.628 u[IU]/mL

## 2012-01-04 LAB — CBC
HCT: 39.6 % (ref 35.0–47.0)
MCH: 28.6 pg (ref 26.0–34.0)
MCV: 86 fL (ref 80–100)
Platelet: 256 10*3/uL (ref 150–440)
RBC: 4.6 10*6/uL (ref 3.80–5.20)
WBC: 12.8 10*3/uL — ABNORMAL HIGH (ref 3.6–11.0)

## 2012-01-04 LAB — CK-MB: CK-MB: 4.6 ng/mL — ABNORMAL HIGH (ref 0.5–3.6)

## 2012-01-05 LAB — CBC WITH DIFFERENTIAL/PLATELET
Basophil #: 0 10*3/uL (ref 0.0–0.1)
Basophil %: 0.1 %
Eosinophil #: 0 10*3/uL (ref 0.0–0.7)
Eosinophil %: 0 %
HCT: 37.3 % (ref 35.0–47.0)
HGB: 12.4 g/dL (ref 12.0–16.0)
Lymphocyte %: 7 %
MCH: 28.4 pg (ref 26.0–34.0)
MCHC: 33.1 g/dL (ref 32.0–36.0)
MCV: 86 fL (ref 80–100)
Monocyte #: 0.4 x10 3/mm (ref 0.2–0.9)
Monocyte %: 2.3 %
RBC: 4.36 10*6/uL (ref 3.80–5.20)
RDW: 13.1 % (ref 11.5–14.5)
WBC: 16.3 10*3/uL — ABNORMAL HIGH (ref 3.6–11.0)

## 2012-01-07 LAB — CBC WITH DIFFERENTIAL/PLATELET
Basophil #: 0 10*3/uL (ref 0.0–0.1)
Basophil %: 0.1 %
Eosinophil #: 0 10*3/uL (ref 0.0–0.7)
Eosinophil %: 0 %
HCT: 36.5 % (ref 35.0–47.0)
HGB: 12.1 g/dL (ref 12.0–16.0)
Lymphocyte #: 0.8 10*3/uL — ABNORMAL LOW (ref 1.0–3.6)
Lymphocyte %: 6.6 %
MCHC: 33.2 g/dL (ref 32.0–36.0)
Monocyte #: 0.5 x10 3/mm (ref 0.2–0.9)
Monocyte %: 4 %
Neutrophil #: 10.6 10*3/uL — ABNORMAL HIGH (ref 1.4–6.5)
Neutrophil %: 89.3 %
Platelet: 283 10*3/uL (ref 150–440)
RBC: 4.24 10*6/uL (ref 3.80–5.20)
RDW: 13.3 % (ref 11.5–14.5)

## 2012-01-08 LAB — CREATININE, SERUM
Creatinine: 0.88 mg/dL (ref 0.60–1.30)
EGFR (African American): >60

## 2012-01-09 LAB — CULTURE, BLOOD (SINGLE)

## 2012-01-14 ENCOUNTER — Encounter: Payer: Self-pay | Admitting: Internal Medicine

## 2012-01-14 ENCOUNTER — Ambulatory Visit (INDEPENDENT_AMBULATORY_CARE_PROVIDER_SITE_OTHER): Payer: PRIVATE HEALTH INSURANCE | Admitting: Internal Medicine

## 2012-01-14 VITALS — BP 128/84 | HR 105 | Temp 97.9°F | Resp 20 | Ht 63.0 in | Wt 233.0 lb

## 2012-01-14 DIAGNOSIS — J441 Chronic obstructive pulmonary disease with (acute) exacerbation: Secondary | ICD-10-CM

## 2012-01-14 DIAGNOSIS — E118 Type 2 diabetes mellitus with unspecified complications: Secondary | ICD-10-CM

## 2012-01-14 DIAGNOSIS — J069 Acute upper respiratory infection, unspecified: Secondary | ICD-10-CM

## 2012-01-14 MED ORDER — DOXYCYCLINE HYCLATE 100 MG PO TABS: 100.0000 mg | ORAL_TABLET | Freq: Two times a day (BID) | ORAL | Status: AC

## 2012-01-14 MED ORDER — FLUCONAZOLE 150 MG PO TABS: 150.0000 mg | ORAL_TABLET | Freq: Every day | ORAL | Status: AC

## 2012-01-14 MED ORDER — PROMETHAZINE-PHENYLEPHRINE 6.25-5 MG/5ML PO SYRP: 5.0000 mL | ORAL_SOLUTION | ORAL | Status: DC | PRN

## 2012-01-14 NOTE — Progress Notes (Signed)
Patient ID: Catherine [name], female   DOB: 30-Aug-1956, 56 y.o.   MRN: [id number]  Patient Active Problem List  Diagnoses  . CAD (coronary artery disease)  . HTN (hypertension)  . Hyperlipidemia  . Tobacco abuse, in remission  . Screening for colon cancer  . Gastritis and duodenitis  . Screening for breast cancer  . GERD (gastroesophageal reflux disease)  . Obesity (BMI 30-39.9)  . Depression  . Anxiety  . COPD exacerbation  . Diabetes mellitus with complication    Subjective:  CC:   Chief Complaint  Patient presents with  . Follow-up    hospital    HPI:   Catherine Vanegas Whiteheadis a 56 y.o. female who presents  Hospital follow up  For COPD exacerbation .  She was admitted on April 28 2 [facility] with a COPD exacerbation causing acute respiratory failure and palpitations. She ruled out for acute MI,  was treated with steroids nebulizers and empiric antibiotics and discharged home after 48 hours. She had several abnormalities that need followup as an outpatient,  specifically an elevated alkaline phosphatase and a history of pulmonary nodules with a reportedly normal PET scan in the past.  She does have a follow up appointment with Dr. Kendrick Fries. All in all she feels no better than when she was in the hospital. She continues to be short of breath  With minimal exertion, continues to cough and wheeze.   she has not returned to work. 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   Past Medical History  Diagnosis Date  . Diabetes mellitus   . Leukocytosis   . Hyperlipidemia   . COPD (chronic obstructive pulmonary disease)   . Hyperglycemia   . Hypertension   . S/P cardiac catheterization March 2012    normal coronaries,  due to chest pain (Gollan)  . Tobacco abuse, in remission     quit oct during hospitalization   . Screening for colon cancer June 2011    polyps found, next one due 2014 ([facility])  . Gastritis and duodenitis June 2011    EGD  . Screening for  breast cancer Jul 28 2011    normal  . GERD (gastroesophageal reflux disease)   . Obesity (BMI 30-39.9)   . Depression   . Anxiety     Past Surgical History  Procedure Date  . Cardiac catheterization 11/22/2010  . Abdominal hysterectomy   . Hemorrhoid surgery   . Abdominal hysterectomy   . Bilateral oophorectomy 1995    and TAH.  noncancerous reasons         The following portions of the patient's history were reviewed and updated as appropriate: Allergies, current medications, and problem list.    Review of Systems:   12 Pt  review of systems was negative except those addressed in the HPI,     History   Social History  . Marital Status: Married    Spouse Name: N/A    Number of Children: N/A  . Years of Education: N/A   Occupational History  . Not on file.   Social History Main Topics  . Smoking status: Former Smoker -- 40 years    Types: Cigarettes    Quit date: 06/27/2011  . Smokeless tobacco: Never Used  . Alcohol Use: No  . Drug Use: No  . Sexually Active: Not on file   Other Topics Concern  . Not on file   Social History Narrative  . No narrative on file  Objective:  BP 128/84  Pulse 105  Temp(Src) 97.9 F (36.6 C) (Oral)  Resp 20  Ht 5\' 3"  (1.6 m)  Wt 233 lb (105.688 kg)  BMI 41.27 kg/m2  SpO2 96%  General appearance: alert, cooperative and appears stated age Ears: normal TM's and external ear canals both ears Throat: lips, mucosa, and tongue normal; teeth and gums normal Neck: no adenopathy, no carotid bruit, supple, symmetrical, trachea midline and thyroid not enlarged, symmetric, no tenderness/mass/nodules Back: symmetric, no curvature. ROM normal. No CVA tenderness. Lungs: clear to auscultation bilaterally Heart: regular rate and rhythm, S1, S2 normal, no murmur, click, rub or gallop Abdomen: soft, non-tender; bowel sounds normal; no masses,  no organomegaly Pulses: 2+ and symmetric Skin: Skin color, texture, turgor normal.  No rashes or lesions Lymph nodes: Cervical, supraclavicular, and axillary nodes normal.  Assessment and Plan:  COPD exacerbation She is not hypoxic but does continue to wheeze on exam. I will repeat her prednisone taper at this time or 2 weeks. She does not additional antibiotics. A chest x-ray was normal.  Diabetes mellitus with complication We have increased her Lantus dose and modify her sliding scale to anticipate the hyperglycemia she'll experience with prednisone.    Updated Medication List Outpatient Encounter Prescriptions as of 01/14/2012  Medication Sig Dispense Refill  . albuterol-ipratropium (COMBIVENT) 18-103 MCG/ACT inhaler Inhale 2 puffs into the lungs every 6 (six) hours as needed.        . ALPRAZolam (XANAX) 0.25 MG tablet Take 1 tablet (0.25 mg total) by mouth at bedtime as needed.  30 tablet  2  . aspirin 81 MG EC tablet Take 81 mg by mouth daily.        Marland Kitchen atorvastatin (LIPITOR) 80 MG tablet Take 1 tablet (80 mg total) by mouth daily.  30 tablet  11  . Fluticasone-Salmeterol (ADVAIR) 250-50 MCG/DOSE AEPB Inhale 1 puff into the lungs every 12 (twelve) hours.        Marland Kitchen glipiZIDE (GLUCOTROL) 5 MG tablet Take 1 tablet (5 mg total) by mouth 2 (two) times daily.  60 tablet  11  . glucose blood (ACCU-CHEK SMARTVIEW) test strip Use as instructed  150 each  11  . insulin glargine (LANTUS) 100 UNIT/ML injection Inject 20 Units into the skin daily.  10 mL  5  . insulin NPH (HUMULIN N,NOVOLIN N) 100 UNIT/ML injection Inject into the skin. Sliding scale      . Insulin Syringe-Needle U-100 (INSULIN SYRINGE .5CC/31GX5/16") 31G X 5/16" 0.5 ML MISC As directed      . ipratropium-albuterol (DUONEB) 0.5-2.5 (3) MG/3ML SOLN Take 3 mLs by nebulization every 4 (four) hours as needed.        . latanoprost (XALATAN) 0.005 % ophthalmic solution as directed.      . pantoprazole (PROTONIX) 40 MG tablet Take 40 mg by mouth daily.        Marland Kitchen PARoxetine (PAXIL) 10 MG tablet Take 10 mg by mouth every  morning.        . predniSONE (STERAPRED UNI-PAK) 10 MG tablet Take 10 mg by mouth daily.      . promethazine-phenylephrine (PROMETHAZINE-PHENYLEPHRINE) 6.25-5 MG/5ML SYRP Take 5 mLs by mouth every 4 (four) hours as needed.  240 mL  0  . tiotropium (SPIRIVA) 18 MCG inhalation capsule Place 1 capsule (18 mcg total) into inhaler and inhale daily.  30 capsule  3  . DISCONTD: promethazine-phenylephrine (PROMETHAZINE-PHENYLEPHRINE) 6.25-5 MG/5ML SYRP Take 5 mLs by mouth every 4 (four) hours as needed.  280 mL  0  . doxycycline (VIBRA-TABS) 100 MG tablet Take 1 tablet (100 mg total) by mouth 2 (two) times daily.  14 tablet  0  . fluconazole (DIFLUCAN) 150 MG tablet Take 1 tablet (150 mg total) by mouth daily.  2 tablet  0     No orders of the defined types were placed in this encounter.    No Follow-up on file.

## 2012-01-15 ENCOUNTER — Telehealth: Payer: Self-pay | Admitting: Internal Medicine

## 2012-01-15 ENCOUNTER — Telehealth: Payer: Self-pay | Admitting: *Deleted

## 2012-01-15 NOTE — Telephone Encounter (Signed)
Have her increase her lantus from 20 units daily to 30 units daily.Please ask her what her sliding scale is using her regular insulin so I can adjust it.

## 2012-01-15 NOTE — Telephone Encounter (Signed)
Caller: Catherine [name]/Patient; PCP: Duncan Dull; CB#: [phone]; Call regarding BS=274 @ 1500, Wants To Know If She Needs To Take More Insulin; Taking Prednisone 2 pills daily for next 5 days. Spoke with RN in the office who will send message to Dr. Dan Humphreys. Advised pt that we will call back shortly with MD advice. Triage and Care advice per Diabetes: Control Problems Protocol.

## 2012-01-15 NOTE — Telephone Encounter (Signed)
Patient advised as instructed via telephone, she takes 3 units 150-200 5 units 201-250 and 8 units 251-300

## 2012-01-15 NOTE — Telephone Encounter (Signed)
Received a call from call a nurse stating that patient started on Prednisone yesterday and her blood sugar is running high today.  Blood sugar was 300, she took 8 units of insulin, and after a meal her blood sugar is 274.  Patient is wanting instructions/directions on whether or not she should take more insulin.  Please advise.

## 2012-01-15 NOTE — Telephone Encounter (Signed)
Thank yo for doing that.  She can increase the sliding scale to 10 units for bs 251 to 300,  Keep the others the same. And increase the lantus as previously suggested while the prednisone is running her sugars up

## 2012-01-16 NOTE — Telephone Encounter (Signed)
Patient advised as instructed via telephone. 

## 2012-01-18 ENCOUNTER — Encounter: Payer: Self-pay | Admitting: Internal Medicine

## 2012-01-18 NOTE — Assessment & Plan Note (Signed)
She is not hypoxic but does continue to wheeze on exam. I will repeat her prednisone taper at this time or 2 weeks. She does not additional antibiotics. A chest x-ray was normal.

## 2012-01-18 NOTE — Assessment & Plan Note (Signed)
We have increased her Lantus dose and modify her sliding scale to anticipate the hyperglycemia she'll experience with prednisone.

## 2012-01-21 ENCOUNTER — Telehealth: Payer: Self-pay | Admitting: Internal Medicine

## 2012-01-21 ENCOUNTER — Encounter: Payer: Self-pay | Admitting: Internal Medicine

## 2012-01-21 ENCOUNTER — Ambulatory Visit (INDEPENDENT_AMBULATORY_CARE_PROVIDER_SITE_OTHER): Payer: PRIVATE HEALTH INSURANCE | Admitting: Internal Medicine

## 2012-01-21 VITALS — BP 122/68 | HR 89 | Temp 98.2°F | Resp 18 | Wt 232.5 lb

## 2012-01-21 DIAGNOSIS — J441 Chronic obstructive pulmonary disease with (acute) exacerbation: Secondary | ICD-10-CM

## 2012-01-21 NOTE — Telephone Encounter (Signed)
Letter printed, waiting on signature from Dr. Darrick Huntsman, will fax when ready.

## 2012-01-21 NOTE — Telephone Encounter (Signed)
Pt called she needs note to go back to work Friday 01/23/12  Please fax to [phone] [employer] employee [employer]

## 2012-01-21 NOTE — Progress Notes (Signed)
Patient ID: Catherine [name], female   DOB: May 06, 1956, 56 y.o.   MRN: [id number]  Patient Active Problem List  Diagnoses  . CAD (coronary artery disease)  . HTN (hypertension)  . Hyperlipidemia  . Tobacco abuse, in remission  . Screening for colon cancer  . Gastritis and duodenitis  . Screening for breast cancer  . GERD (gastroesophageal reflux disease)  . Obesity (BMI 30-39.9)  . Depression  . Anxiety  . COPD exacerbation  . Diabetes mellitus with complication    Subjective:  CC:   Chief Complaint  Patient presents with  . Follow-up    HPI:   Catherine [name] a 56 y.o. female who presents for one week follow up on persistent dyspnea following brief April admission for COPD exacerbation.  She finished the  prednisone taper prescribed by the hospitalist and at last visit I added a one-week course of doxycycline for persistent productive cough. She states that the cough has improved however she continues to wheeze and notes that she drops her saturations to 92% with ambulation. Here in the office ambulatory sats drop to 93% off of 02 and return to 96 % with HR increasing to 115.  She is winded but her  lungs are clear.     Past Medical History  Diagnosis Date  . Diabetes mellitus   . Leukocytosis   . Hyperlipidemia   . COPD (chronic obstructive pulmonary disease)   . Hyperglycemia   . Hypertension   . S/P cardiac catheterization March 2012    normal coronaries,  due to chest pain ([place])  . Tobacco abuse, in remission     quit oct during hospitalization   . Screening for colon cancer June 2011    polyps found, next one due 2014 ([facility])  . Gastritis and duodenitis June 2011    EGD  . Screening for breast cancer Jul 28 2011    normal  . GERD (gastroesophageal reflux disease)   . Obesity (BMI 30-39.9)   . Depression   . Anxiety     Past Surgical History  Procedure Date  . Cardiac catheterization 11/22/2010  . Abdominal hysterectomy   . Hemorrhoid  surgery   . Abdominal hysterectomy   . Bilateral oophorectomy 1995    and TAH.  noncancerous reasons         The following portions of the patient's history were reviewed and updated as appropriate: Allergies, current medications, and problem list.    Review of Systems:   12 Pt  review of systems was negative except those addressed in the HPI,     History   Social History  . Marital Status: Married    Spouse Name: N/A    Number of Children: N/A  . Years of Education: N/A   Occupational History  . Not on file.   Social History Main Topics  . Smoking status: Former Smoker -- 40 years    Types: Cigarettes    Quit date: 06/27/2011  . Smokeless tobacco: Never Used  . Alcohol Use: No  . Drug Use: No  . Sexually Active: Not on file   Other Topics Concern  . Not on file   Social History Narrative  . No narrative on file    Objective:  BP 122/68  Pulse 89  Temp(Src) 98.2 F (36.8 C) (Oral)  Resp 18  Wt 232 lb 8 oz (105.461 kg)  SpO2 95%  General appearance: alert, cooperative and appears stated age Ears: normal TM's and external ear canals  both ears Throat: lips, mucosa, and tongue normal; teeth and gums normal Neck: no adenopathy, no carotid bruit, supple, symmetrical, trachea midline and thyroid not enlarged, symmetric, no tenderness/mass/nodules Back: symmetric, no curvature. ROM normal. No CVA tenderness. Lungs: clear to auscultation bilaterally Heart: regular rate and rhythm, S1, S2 normal, no murmur, click, rub or gallop Abdomen: soft, non-tender; bowel sounds normal; no masses,  no organomegaly Pulses: 2+ and symmetric Skin: Skin color, texture, turgor normal. No rashes or lesions Lymph nodes: Cervical, supraclavicular, and axillary nodes normal.  Assessment and Plan:  COPD exacerbation Her lungs remained tight but she is moving adequate air. She has difficulty controlling her blood sugars with recurrent prednisone tapers. Identification is  another prednisone course but will add nasonex 2 squirts twice daily for one week .  Return to work tomorrow.     Updated Medication List Outpatient Encounter Prescriptions as of 01/21/2012  Medication Sig Dispense Refill  . albuterol-ipratropium (COMBIVENT) 18-103 MCG/ACT inhaler Inhale 2 puffs into the lungs every 6 (six) hours as needed.        . ALPRAZolam (XANAX) 0.25 MG tablet Take 1 tablet (0.25 mg total) by mouth at bedtime as needed.  30 tablet  2  . aspirin 81 MG EC tablet Take 81 mg by mouth daily.        Marland Kitchen atorvastatin (LIPITOR) 80 MG tablet Take 1 tablet (80 mg total) by mouth daily.  30 tablet  11  . doxycycline (VIBRA-TABS) 100 MG tablet Take 1 tablet (100 mg total) by mouth 2 (two) times daily.  14 tablet  0  . Fluticasone-Salmeterol (ADVAIR) 250-50 MCG/DOSE AEPB Inhale 1 puff into the lungs every 12 (twelve) hours.        Marland Kitchen glipiZIDE (GLUCOTROL) 5 MG tablet Take 1 tablet (5 mg total) by mouth 2 (two) times daily.  60 tablet  11  . glucose blood (ACCU-CHEK SMARTVIEW) test strip Use as instructed  150 each  11  . insulin glargine (LANTUS) 100 UNIT/ML injection Inject 20 Units into the skin daily.  10 mL  5  . insulin NPH (HUMULIN N,NOVOLIN N) 100 UNIT/ML injection Inject into the skin. Sliding scale      . Insulin Syringe-Needle U-100 (INSULIN SYRINGE .5CC/31GX5/16") 31G X 5/16" 0.5 ML MISC As directed      . ipratropium-albuterol (DUONEB) 0.5-2.5 (3) MG/3ML SOLN Take 3 mLs by nebulization every 4 (four) hours as needed.        . latanoprost (XALATAN) 0.005 % ophthalmic solution as directed.      . pantoprazole (PROTONIX) 40 MG tablet Take 40 mg by mouth daily.        Marland Kitchen PARoxetine (PAXIL) 10 MG tablet Take 10 mg by mouth every morning.        . promethazine-phenylephrine (PROMETHAZINE-PHENYLEPHRINE) 6.25-5 MG/5ML SYRP Take 5 mLs by mouth every 4 (four) hours as needed.  240 mL  0  . tiotropium (SPIRIVA) 18 MCG inhalation capsule Place 1 capsule (18 mcg total) into inhaler and  inhale daily.  30 capsule  3  . DISCONTD: predniSONE (STERAPRED UNI-PAK) 10 MG tablet Take 10 mg by mouth daily.         No orders of the defined types were placed in this encounter.    No Follow-up on file.

## 2012-01-21 NOTE — Assessment & Plan Note (Addendum)
Her lungs remained tight but she is moving adequate air. She has difficulty controlling her blood sugars with recurrent prednisone tapers. Identification is another prednisone course but will add nasonex 2 squirts twice daily for one week .  Return to work tomorrow.

## 2012-01-23 ENCOUNTER — Ambulatory Visit: Payer: PRIVATE HEALTH INSURANCE | Admitting: Internal Medicine

## 2012-01-23 NOTE — Telephone Encounter (Signed)
Patient found original letter.

## 2012-02-11 ENCOUNTER — Other Ambulatory Visit: Payer: Self-pay | Admitting: Internal Medicine

## 2012-02-11 MED ORDER — FLUTICASONE-SALMETEROL 250-50 MCG/DOSE IN AEPB: 1.0000 | INHALATION_SPRAY | Freq: Two times a day (BID) | RESPIRATORY_TRACT | Status: DC

## 2012-02-17 ENCOUNTER — Encounter: Payer: Self-pay | Admitting: Pulmonary Disease

## 2012-02-17 ENCOUNTER — Ambulatory Visit (INDEPENDENT_AMBULATORY_CARE_PROVIDER_SITE_OTHER): Payer: PRIVATE HEALTH INSURANCE | Admitting: Pulmonary Disease

## 2012-02-17 VITALS — BP 118/76 | HR 85 | Temp 98.1°F | Ht 63.0 in | Wt 235.0 lb

## 2012-02-17 DIAGNOSIS — J4489 Other specified chronic obstructive pulmonary disease: Secondary | ICD-10-CM

## 2012-02-17 DIAGNOSIS — J441 Chronic obstructive pulmonary disease with (acute) exacerbation: Secondary | ICD-10-CM

## 2012-02-17 DIAGNOSIS — J449 Chronic obstructive pulmonary disease, unspecified: Secondary | ICD-10-CM

## 2012-02-17 MED ORDER — ALBUTEROL SULFATE (2.5 MG/3ML) 0.083% IN NEBU: 2.5000 mg | INHALATION_SOLUTION | Freq: Four times a day (QID) | RESPIRATORY_TRACT | Status: DC | PRN

## 2012-02-17 MED ORDER — ALBUTEROL SULFATE HFA 108 (90 BASE) MCG/ACT IN AERS: 2.0000 | INHALATION_SPRAY | Freq: Four times a day (QID) | RESPIRATORY_TRACT | Status: DC | PRN

## 2012-02-17 MED ORDER — ROFLUMILAST 500 MCG PO TABS: 500.0000 ug | ORAL_TABLET | Freq: Every day | ORAL | Status: DC

## 2012-02-17 NOTE — Progress Notes (Signed)
Subjective:    Patient ID: Catherine [name], female    DOB: 05/12/1956, 55 y.o.   MRN: [id number]  HPI 56 y/o female with COPD who is referred to [place] for evaluation and management of the same.  She first noticed difficulty breathing, cough, and wheezing 5 years ago.  She smoked 1ppd for 40 years and quit 8 months ago.  She has some cough, but no sputum production despite the sensation of chest congestion.  She has been hospitalized twice for exacerbations, most recently in two months ago.  She can do her grocery shopping and walk more than 100 yards without stopping, but she often has difficulty trying to keep up with other people her age.  She notes some wheezing occasionally.  She uses duonebs throughout the day as well as spiriva, combivent, and advair.  She has noted some eye pain and dryness with increased duoneb use.  She denies chest pain and had a negative LHC with Dr. Mariah Milling in 2012.   She notes some environmental triggers to her shortness of breath including perfumes, dust, and some cleaning solutions.   She states that her acid reflux is well controlled and she has very minimal sinus congestion occasionally.  Past Medical History  Diagnosis Date  . Diabetes mellitus   . Leukocytosis   . Hyperlipidemia   . COPD (chronic obstructive pulmonary disease)   . Hyperglycemia   . Hypertension   . S/P cardiac catheterization March 2012    normal coronaries,  due to chest pain ([place])  . Tobacco abuse, in remission     quit oct during hospitalization   . Screening for colon cancer June 2011    polyps found, next one due 2014 ([facility])  . Gastritis and duodenitis June 2011    EGD  . Screening for breast cancer Jul 28 2011    normal  . GERD (gastroesophageal reflux disease)   . Obesity (BMI 30-39.9)   . Depression   . Anxiety      Family History  Problem Relation Age of Onset  . Coronary artery disease Mother 20  . Heart disease Mother     CABG x5  . COPD Mother 29  . Breast  cancer Mother   . Kidney disease Mother   . Heart failure Father   . Angina Father   . Diabetes Father   . Depression Father   . Asthma Brother   . Asthma Brother      History   Social History  . Marital Status: Married    Spouse Name: N/A    Number of Children: 4  . Years of Education: N/A   Occupational History  . Secretary    Social History Main Topics  . Smoking status: Former Smoker -- 1.0 packs/day for 40 years    Types: Cigarettes    Quit date: 06/27/2011  . Smokeless tobacco: Never Used  . Alcohol Use: No  . Drug Use: No  . Sexually Active: Not on file   Other Topics Concern  . Not on file   Social History Narrative  . No narrative on file     Allergies  Allergen Reactions  . Codeine   . Ibuprofen   . Morphine And Related   . Oxycodone-Acetaminophen   . Percocet (Oxycodone-Acetaminophen)   . Vicodin (Hydrocodone-Acetaminophen)      Outpatient Prescriptions Prior to Visit  Medication Sig Dispense Refill  . albuterol-ipratropium (COMBIVENT) 18-103 MCG/ACT inhaler Inhale 2 puffs into the lungs every 6 (six) hours  as needed.        . ALPRAZolam (XANAX) 0.25 MG tablet Take 1 tablet (0.25 mg total) by mouth at bedtime as needed.  30 tablet  2  . aspirin 81 MG EC tablet Take 81 mg by mouth daily.        Marland Kitchen atorvastatin (LIPITOR) 80 MG tablet Take 1 tablet (80 mg total) by mouth daily.  30 tablet  11  . Fluticasone-Salmeterol (ADVAIR) 250-50 MCG/DOSE AEPB Inhale 1 puff into the lungs every 12 (twelve) hours.  60 each  5  . glipiZIDE (GLUCOTROL) 5 MG tablet Take 1 tablet (5 mg total) by mouth 2 (two) times daily.  60 tablet  11  . glucose blood (ACCU-CHEK SMARTVIEW) test strip Use as instructed  150 each  11  . insulin glargine (LANTUS) 100 UNIT/ML injection Inject 20 Units into the skin daily.  10 mL  5  . insulin NPH (HUMULIN N,NOVOLIN N) 100 UNIT/ML injection Inject into the skin. Sliding scale      . Insulin Syringe-Needle U-100 (INSULIN SYRINGE  .5CC/31GX5/16") 31G X 5/16" 0.5 ML MISC As directed      . ipratropium-albuterol (DUONEB) 0.5-2.5 (3) MG/3ML SOLN Take 3 mLs by nebulization every 4 (four) hours as needed.        . pantoprazole (PROTONIX) 40 MG tablet Take 40 mg by mouth daily.        Marland Kitchen PARoxetine (PAXIL) 10 MG tablet Take 10 mg by mouth every morning.        . promethazine-phenylephrine (PROMETHAZINE-PHENYLEPHRINE) 6.25-5 MG/5ML SYRP Take 5 mLs by mouth every 4 (four) hours as needed.  240 mL  0  . tiotropium (SPIRIVA) 18 MCG inhalation capsule Place 1 capsule (18 mcg total) into inhaler and inhale daily.  30 capsule  3  . latanoprost (XALATAN) 0.005 % ophthalmic solution as directed.          Review of Systems  Constitutional: Negative for fever, chills and unexpected weight change.  HENT: Positive for sore throat, sneezing, trouble swallowing and voice change. Negative for ear pain, nosebleeds, congestion, rhinorrhea, dental problem, postnasal drip and sinus pressure.   Eyes: Negative for visual disturbance.  Respiratory: Positive for cough and shortness of breath. Negative for choking.   Cardiovascular: Negative for chest pain and leg swelling.  Gastrointestinal: Negative for vomiting, abdominal pain and diarrhea.  Genitourinary: Negative for difficulty urinating.  Musculoskeletal: Negative for arthralgias.  Skin: Negative for rash.  Neurological: Negative for tremors, syncope and headaches.  Hematological: Does not bruise/bleed easily.       Objective:   Physical Exam  Filed Vitals:   02/17/12 1407  BP: 118/76  Pulse: 85  Temp: 98.1 F (36.7 C)  TempSrc: Oral  Height: 5\' 3"  (1.6 m)  Weight: 235 lb (106.595 kg)  SpO2: 93%   Gen: obese, no acute distress HEENT: NCAT, PERRL, EOMi, OP clear, neck supple without masses PULM: Diminished air movement, some exp wheezing noted CV: RRR, no mgr, no JVD AB: BS+, soft, nontender, no hsm Ext: warm, trace edema, no clubbing, no cyanosis Derm: no rash or skin  breakdown Neuro: A&Ox4, CN II-XII intact, strength 5/5 in all 4 extremities  09/2011 and 12/2011 CX[facility] from [facility] reviewed: no clear parenchymal abnormality, some vascular congestion     Assessment & Plan:   COPD exacerbation COPD: GOLD Grade C or D based on symptoms, need to get PFT's from [facility] Combined recommendations from the [employer], [employer] of [employer]icians, Designer, television/film set,  European Respiratory Society (Qaseem A et al, Ann Intern Med. 2011;155(3):179) recommends tobacco cessation, pulmonary rehab (for symptomatic patients with an FEV1 < 50% predicted), supplemental oxygen (for patients with SaO2 <88% or paO2 <55), and appropriate bronchodilator therapy.  In regards to long acting bronchodilators, they recommend monotherapy (FEV1 60-80% with symptoms weak evidence, FEV1 with symptoms <60% strong evidence), or combination therapy (FEV1 <60% with symptoms, strong recommendation, moderate evidence).  One should also provide patients with annual immunizations and consider therapy for prevention of COPD exacerbations (ie. roflumilast or azithromycin) when appopriate.  -O2 therapy: uses occasionally at home, does not drop in office.  Will order 6 MW with pulm rehab -Immunizations: UTD -Tobacco use: 40 pack years, quit 2013 -Exercise: does not exercise regularly; encouraged at length today to start pulm rehab at [facility]; referral sent; will get PFT at [facility] for enrollment -Bronchodilator therapy: continue albuterol, spiriva, and advair; I expressed my concern over the dry eyes and eye pain as I worry this is related to her spiriva and ipratropium use; I d/c'd the duoneb and combivent and started xopenex nebs and ventolin HFA -Exacerbation prevention: start roflumilast     Updated Medication List Outpatient Encounter Prescriptions as of 02/17/2012  Medication Sig Dispense Refill  . ALPRAZolam (XANAX) 0.25 MG tablet Take 1 tablet (0.25 mg total) by mouth at  bedtime as needed.  30 tablet  2  . aspirin 81 MG EC tablet Take 81 mg by mouth daily.        Marland Kitchen atorvastatin (LIPITOR) 80 MG tablet Take 1 tablet (80 mg total) by mouth daily.  30 tablet  11  . Fluticasone-Salmeterol (ADVAIR) 250-50 MCG/DOSE AEPB Inhale 1 puff into the lungs every 12 (twelve) hours.  60 each  5  . glipiZIDE (GLUCOTROL) 5 MG tablet Take 1 tablet (5 mg total) by mouth 2 (two) times daily.  60 tablet  11  . glucose blood (ACCU-CHEK SMARTVIEW) test strip Use as instructed  150 each  11  . insulin glargine (LANTUS) 100 UNIT/ML injection Inject 20 Units into the skin daily.  10 mL  5  . insulin NPH (HUMULIN N,NOVOLIN N) 100 UNIT/ML injection Inject into the skin. Sliding scale      . Insulin Syringe-Needle U-100 (INSULIN SYRINGE .5CC/31GX5/16") 31G X 5/16" 0.5 ML MISC As directed      . ipratropium-albuterol (DUONEB) 0.5-2.5 (3) MG/3ML SOLN Take 3 mLs by nebulization every 4 (four) hours as needed.        . pantoprazole (PROTONIX) 40 MG tablet Take 40 mg by mouth daily.        Marland Kitchen PARoxetine (PAXIL) 10 MG tablet Take 10 mg by mouth every morning.        . promethazine-phenylephrine (PROMETHAZINE-PHENYLEPHRINE) 6.25-5 MG/5ML SYRP Take 5 mLs by mouth every 4 (four) hours as needed.  240 mL  0  . tiotropium (SPIRIVA) 18 MCG inhalation capsule Place 1 capsule (18 mcg total) into inhaler and inhale daily.  30 capsule  3  . DISCONTD: albuterol-ipratropium (COMBIVENT) 18-103 MCG/ACT inhaler Inhale 2 puffs into the lungs every 6 (six) hours as needed.        Marland Kitchen albuterol (PROVENTIL HFA) 108 (90 BASE) MCG/ACT inhaler Inhale 2 puffs into the lungs every 6 (six) hours as needed for wheezing.  2 Inhaler  3  . albuterol (PROVENTIL) (2.5 MG/3ML) 0.083% nebulizer solution Take 3 mLs (2.5 mg total) by nebulization every 6 (six) hours as needed for wheezing.  75 mL  12  . latanoprost (XALATAN)  0.005 % ophthalmic solution as directed.      . roflumilast (DALIRESP) 500 MCG TABS tablet Take 1 tablet (500 mcg  total) by mouth daily.  30 tablet  2

## 2012-02-17 NOTE — Assessment & Plan Note (Signed)
COPD: GOLD Grade C or D based on symptoms, need to get PFT's from [facility] Combined recommendations from the [employer], [employer], Designer, television/film set, European Respiratory Society (Qaseem A et al, Ann Intern Med. 2011;155(3):179) recommends tobacco cessation, pulmonary rehab (for symptomatic patients with an FEV1 < 50% predicted), supplemental oxygen (for patients with SaO2 <88% or paO2 <55), and appropriate bronchodilator therapy.  In regards to long acting bronchodilators, they recommend monotherapy (FEV1 60-80% with symptoms weak evidence, FEV1 with symptoms <60% strong evidence), or combination therapy (FEV1 <60% with symptoms, strong recommendation, moderate evidence).  One should also provide patients with annual immunizations and consider therapy for prevention of COPD exacerbations (ie. roflumilast or azithromycin) when appopriate.  -O2 therapy: uses occasionally at home, does not drop in office.  Will order 6 MW with pulm rehab -Immunizations: UTD -Tobacco use: 40 pack years, quit 2013 -Exercise: does not exercise regularly; encouraged at length today to start pulm rehab at [facility]; referral sent; will get PFT at [facility] for enrollment -Bronchodilator therapy: continue albuterol, spiriva, and advair; I expressed my concern over the dry eyes and eye pain as I worry this is related to her spiriva and ipratropium use; I d/c'd the duoneb and combivent and started xopenex nebs and ventolin HFA -Exacerbation prevention: start roflumilast

## 2012-02-17 NOTE — Patient Instructions (Signed)
Start using roflumilast daily.  If you get an upset stomach, take it every other day for two weeks then use it daily Stop using ipratropium (duoneb and combivent) and replace it with albuterol (I have sent Rx for albuterol inhaler and nebulizer) We will refer you to pulmonary rehab and will have you get a full PFT at [facility] We will see you back in 2-3 months

## 2012-02-18 ENCOUNTER — Other Ambulatory Visit: Payer: Self-pay | Admitting: Internal Medicine

## 2012-02-18 MED ORDER — TIOTROPIUM BROMIDE MONOHYDRATE 18 MCG IN CAPS: 18.0000 ug | ORAL_CAPSULE | Freq: Every day | RESPIRATORY_TRACT | Status: DC

## 2012-02-24 ENCOUNTER — Ambulatory Visit: Payer: Self-pay | Admitting: Pulmonary Disease

## 2012-02-24 LAB — PULMONARY FUNCTION TEST

## 2012-02-28 ENCOUNTER — Inpatient Hospital Stay: Payer: Self-pay | Admitting: Specialist

## 2012-02-28 LAB — COMPREHENSIVE METABOLIC PANEL
Albumin: 3.3 g/dL — ABNORMAL LOW (ref 3.4–5.0)
Alkaline Phosphatase: 157 U/L — ABNORMAL HIGH (ref 50–136)
Anion Gap: 6 — ABNORMAL LOW (ref 7–16)
Bilirubin,Total: 0.3 mg/dL (ref 0.2–1.0)
Calcium, Total: 8.5 mg/dL (ref 8.5–10.1)
Creatinine: 0.89 mg/dL (ref 0.60–1.30)
EGFR (African American): >60
Glucose: 164 mg/dL — ABNORMAL HIGH (ref 65–99)
Osmolality: 281 (ref 275–301)
Potassium: 3.5 mmol/L (ref 3.5–5.1)
Sodium: 139 mmol/L (ref 136–145)

## 2012-02-28 LAB — CBC WITH DIFFERENTIAL/PLATELET
Basophil #: 0 10*3/uL (ref 0.0–0.1)
Basophil %: 0.4 %
Eosinophil #: 0.3 10*3/uL (ref 0.0–0.7)
HCT: 37.2 % (ref 35.0–47.0)
HGB: 12.7 g/dL (ref 12.0–16.0)
Lymphocyte %: 29.9 %
MCH: 29.3 pg (ref 26.0–34.0)
Monocyte #: 0.7 x10 3/mm (ref 0.2–0.9)
Monocyte %: 6 %
Neutrophil #: 7.3 10*3/uL — ABNORMAL HIGH (ref 1.4–6.5)
RBC: 4.34 10*6/uL (ref 3.80–5.20)
RDW: 14.2 % (ref 11.5–14.5)
WBC: 11.9 10*3/uL — ABNORMAL HIGH (ref 3.6–11.0)

## 2012-02-28 LAB — TROPONIN I: Troponin-I: <0.02 ng/mL

## 2012-02-29 LAB — COMPREHENSIVE METABOLIC PANEL
Albumin: 3.2 g/dL — ABNORMAL LOW (ref 3.4–5.0)
Alkaline Phosphatase: 146 U/L — ABNORMAL HIGH (ref 50–136)
Anion Gap: 8 (ref 7–16)
BUN: 13 mg/dL (ref 7–18)
Calcium, Total: 9 mg/dL (ref 8.5–10.1)
Chloride: 106 mmol/L (ref 98–107)
Co2: 26 mmol/L (ref 21–32)
Creatinine: 1.01 mg/dL (ref 0.60–1.30)
EGFR (Non-African Amer.): >60
Glucose: 234 mg/dL — ABNORMAL HIGH (ref 65–99)
Potassium: 4.3 mmol/L (ref 3.5–5.1)
Sodium: 140 mmol/L (ref 136–145)
Total Protein: 7.5 g/dL (ref 6.4–8.2)

## 2012-02-29 LAB — CBC WITH DIFFERENTIAL/PLATELET
Basophil #: 0 10*3/uL (ref 0.0–0.1)
HGB: 12.8 g/dL (ref 12.0–16.0)
Lymphocyte #: 0.9 10*3/uL — ABNORMAL LOW (ref 1.0–3.6)
MCHC: 31.9 g/dL — ABNORMAL LOW (ref 32.0–36.0)
Monocyte %: 2 %
Neutrophil #: 16 10*3/uL — ABNORMAL HIGH (ref 1.4–6.5)
RBC: 4.59 10*6/uL (ref 3.80–5.20)
WBC: 17.2 10*3/uL — ABNORMAL HIGH (ref 3.6–11.0)

## 2012-02-29 LAB — MAGNESIUM: Magnesium: 1.8 mg/dL

## 2012-03-01 ENCOUNTER — Telehealth: Payer: Self-pay | Admitting: Internal Medicine

## 2012-03-01 DIAGNOSIS — J449 Chronic obstructive pulmonary disease, unspecified: Secondary | ICD-10-CM

## 2012-03-01 NOTE — Telephone Encounter (Signed)
Patient stated that she also needs an order for small o2 bag so that she can go to work, needs it faxed to [facility]/   Called and spoke with patient since her hospital discharge, she says that she is doing some better, still has trouble breathing at times.  Advised her to keep apt for 7/15,but to call if things got worse.

## 2012-03-01 NOTE — Telephone Encounter (Signed)
order in EPIC

## 2012-03-01 NOTE — Telephone Encounter (Signed)
Yes it is the small portable oxygen tank, pt states Dr. Berneice Heinrich sure of spelling) he started her on the oxygen and told pt to call Dr.[name] for the portable tank order.

## 2012-03-01 NOTE — Telephone Encounter (Signed)
hosptial discharge 6/23 made follow up appointmetn for 7/15  Pt stated she need rx for apera small o2 bag that you can carry with her The hospital wanted her to have this

## 2012-03-01 NOTE — Telephone Encounter (Signed)
What is a small 02 bag?  Does she mean an ambulatory 02 compressor/tank?  Please clarify patient's request so we can order the right thing.  If she was sent home on 02 the hospitalist should have taken care of this.

## 2012-03-03 NOTE — Telephone Encounter (Signed)
Patient called & said the order to [facility] should have been for the small bag that you put over your shoulder. Please send in a new order.

## 2012-03-04 LAB — CULTURE, BLOOD (SINGLE)

## 2012-03-04 NOTE — Telephone Encounter (Signed)
EPIC does not allow [place] to be specific. Please [facility] and see if there is a name not a description for what she needs other Same order applies and we just write those words

## 2012-03-05 NOTE — Telephone Encounter (Signed)
[facility] sent a form that are the specifications that patient needed.  Form has been faxed back to [facility].

## 2012-03-14 ENCOUNTER — Inpatient Hospital Stay: Payer: Self-pay | Admitting: Internal Medicine

## 2012-03-14 LAB — BASIC METABOLIC PANEL WITH GFR
Anion Gap: 8
BUN: 12 mg/dL
Calcium, Total: 8.3 mg/dL — ABNORMAL LOW
Chloride: 107 mmol/L
Co2: 26 mmol/L
Creatinine: 0.78 mg/dL
EGFR (African American): >60
EGFR (Non-African Amer.): >60
Glucose: 162 mg/dL — ABNORMAL HIGH
Osmolality: 285
Potassium: 4.1 mmol/L
Sodium: 141 mmol/L

## 2012-03-14 LAB — TROPONIN I: Troponin-I: <0.02 ng/mL

## 2012-03-14 LAB — CBC
HGB: 13.4 g/dL (ref 12.0–16.0)
MCH: 29.7 pg (ref 26.0–34.0)
MCV: 88 fL (ref 80–100)
Platelet: 292 10*3/uL (ref 150–440)
RBC: 4.52 10*6/uL (ref 3.80–5.20)
RDW: 14.3 % (ref 11.5–14.5)
WBC: 12.8 10*3/uL — ABNORMAL HIGH (ref 3.6–11.0)

## 2012-03-15 LAB — CBC WITH DIFFERENTIAL/PLATELET
Basophil #: 0 10*3/uL (ref 0.0–0.1)
Basophil %: 0.2 %
Eosinophil #: 0 10*3/uL (ref 0.0–0.7)
Lymphocyte %: 5.1 %
MCHC: 33 g/dL (ref 32.0–36.0)
MCV: 87 fL (ref 80–100)
Monocyte #: 0.2 x10 3/mm (ref 0.2–0.9)
Monocyte %: 1.7 %
Neutrophil #: 13.1 10*3/uL — ABNORMAL HIGH (ref 1.4–6.5)
Neutrophil %: 93 %
Platelet: 252 10*3/uL (ref 150–440)
RBC: 4.34 10*6/uL (ref 3.80–5.20)

## 2012-03-22 ENCOUNTER — Encounter: Payer: PRIVATE HEALTH INSURANCE | Admitting: Internal Medicine

## 2012-03-22 ENCOUNTER — Encounter: Payer: Self-pay | Admitting: Internal Medicine

## 2012-03-22 ENCOUNTER — Ambulatory Visit (INDEPENDENT_AMBULATORY_CARE_PROVIDER_SITE_OTHER): Payer: PRIVATE HEALTH INSURANCE | Admitting: Internal Medicine

## 2012-03-22 VITALS — BP 120/86 | HR 92 | Temp 98.6°F | Wt 235.0 lb

## 2012-03-22 DIAGNOSIS — J441 Chronic obstructive pulmonary disease with (acute) exacerbation: Secondary | ICD-10-CM

## 2012-03-22 NOTE — Progress Notes (Signed)
Patient ID: Catherine [name], female   DOB: [birth year], 56 y.o.   MRN: [id number]  Patient Active Problem List  Diagnosis  . CAD (coronary artery disease)  . HTN (hypertension)  . Hyperlipidemia  . Tobacco abuse, in remission  . Screening for colon cancer  . Gastritis and duodenitis  . Screening for breast cancer  . GERD (gastroesophageal reflux disease)  . Obesity (BMI 30-39.9)  . Depression  . Anxiety  . COPD exacerbation  . Diabetes mellitus with complication    Subjective:  CC:   Chief Complaint  Patient presents with  . hospital folllow up    HPI:   Catherine [name] a 56 y.o. female who presents for hospital followup for 3 day hospitalization . Symptoms started the day of hospitalization  Was at work in ER and developed dyspnea, cough,  No pneumonmmia on cxr.  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  This is her 3rd hostpitlaziaton since April and each time she is hospitalized.    Past Medical History  Diagnosis Date  . Diabetes mellitus   . Leukocytosis   . Hyperlipidemia   . COPD (chronic obstructive pulmonary disease)   . Hyperglycemia   . Hypertension   . S/P cardiac catheterization March 2012    normal coronaries,  due to chest pain ([place])  . Tobacco abuse, in remission     quit oct during hospitalization   . Screening for colon cancer June 2011    polyps found, next one due 2014 ([facility])  . Gastritis and duodenitis June 2011    EGD  . Screening for breast cancer Jul 28 2011    normal  . GERD (gastroesophageal reflux disease)   . Obesity (BMI 30-39.9)   . Depression   . Anxiety     Past Surgical History  Procedure Date  . Cardiac catheterization 11/22/2010  . Abdominal hysterectomy   . Hemorrhoid surgery   . Abdominal hysterectomy   . Bilateral oophorectomy 1995    and TAH.  noncancerous reasons         The following portions of the patient's history were reviewed and updated as  appropriate: Allergies, current medications, and problem list.    Review of Systems:   12 Pt  review of systems was negative except those addressed in the HPI,     History   Social History  . Marital Status: Married    Spouse Name: N/A    Number of Children: 4  . Years of Education: N/A   Occupational History  . Secretary    Social History Main Topics  . Smoking status: Former Smoker -- 1.0 packs/day for 40 years    Types: Cigarettes    Quit date: 06/27/2011  . Smokeless tobacco: Never Used  . Alcohol Use: No  . Drug Use: No  . Sexually Active: Not on file   Other Topics Concern  . Not on file   Social History Narrative  . No narrative on file    Objective:  BP 120/86  Pulse 92  Temp 98.6 F (37 C) (Oral)  Wt 235 lb (106.595 kg)  SpO2 96%  General appearance: alert, cooperative and appears stated age Ears: normal TM's and external ear canals both ears Throat: lips, mucosa, and tongue normal; teeth and gums normal Neck: no adenopathy, no carotid bruit, supple, symmetrical, trachea midline and thyroid not enlarged, symmetric, no tenderness/mass/nodules Back: symmetric, no curvature. ROM normal. No CVA tenderness.  Lungs: clear to auscultation bilaterally Heart: regular rate and rhythm, S1, S2 normal, no murmur, click, rub or gallop Abdomen: soft, non-tender; bowel sounds normal; no masses,  no organomegaly Pulses: 2+ and symmetric Skin: Skin color, texture, turgor normal. No rashes or lesions Lymph nodes: Cervical, supraclavicular, and axillary nodes normal.  Assessment and Plan:  COPD exacerbation With 3 exacerbations since April.   Still dyspneic at rest but not wheezing.  continue 02, prednisone, bronchodilators .  I have advised her to stop working .   Updated Medication List Outpatient Encounter Prescriptions as of 03/22/2012  Medication Sig Dispense Refill  . albuterol (PROVENTIL HFA) 108 (90 BASE) MCG/ACT inhaler Inhale 2 puffs into the lungs  every 6 (six) hours as needed for wheezing.  2 Inhaler  3  . albuterol (PROVENTIL) (2.5 MG/3ML) 0.083% nebulizer solution Take 3 mLs (2.5 mg total) by nebulization every 6 (six) hours as needed for wheezing.  75 mL  12  . ALPRAZolam (XANAX) 0.25 MG tablet Take 1 tablet (0.25 mg total) by mouth at bedtime as needed.  30 tablet  2  . aspirin 81 MG EC tablet Take 81 mg by mouth daily.        Marland Kitchen atorvastatin (LIPITOR) 80 MG tablet Take 1 tablet (80 mg total) by mouth daily.  30 tablet  11  . Fluticasone-Salmeterol (ADVAIR) 250-50 MCG/DOSE AEPB Inhale 1 puff into the lungs every 12 (twelve) hours.  60 each  5  . glipiZIDE (GLUCOTROL) 5 MG tablet Take 1 tablet (5 mg total) by mouth 2 (two) times daily.  60 tablet  11  . glucose blood (ACCU-CHEK SMARTVIEW) test strip Use as instructed  150 each  11  . insulin glargine (LANTUS) 100 UNIT/ML injection Inject 20 Units into the skin daily.  10 mL  5  . insulin NPH (HUMULIN N,NOVOLIN N) 100 UNIT/ML injection Inject into the skin. Sliding scale      . Insulin Syringe-Needle U-100 (INSULIN SYRINGE .5CC/31GX5/16") 31G X 5/16" 0.5 ML MISC As directed      . ipratropium-albuterol (DUONEB) 0.5-2.5 (3) MG/3ML SOLN Take 3 mLs by nebulization every 4 (four) hours as needed.        . latanoprost (XALATAN) 0.005 % ophthalmic solution as directed.      . pantoprazole (PROTONIX) 40 MG tablet Take 40 mg by mouth daily.        Marland Kitchen PARoxetine (PAXIL) 10 MG tablet Take 10 mg by mouth every morning.        . promethazine-phenylephrine (PROMETHAZINE-PHENYLEPHRINE) 6.25-5 MG/5ML SYRP Take 5 mLs by mouth every 4 (four) hours as needed.  240 mL  0  . tiotropium (SPIRIVA) 18 MCG inhalation capsule Place 1 capsule (18 mcg total) into inhaler and inhale daily.  30 capsule  3  . DISCONTD: roflumilast (DALIRESP) 500 MCG TABS tablet Take 1 tablet (500 mcg total) by mouth daily.  30 tablet  2     No orders of the defined types were placed in this encounter.    No Follow-up on file.

## 2012-03-23 ENCOUNTER — Encounter: Payer: Self-pay | Admitting: Pulmonary Disease

## 2012-03-23 ENCOUNTER — Encounter: Payer: Self-pay | Admitting: Internal Medicine

## 2012-03-23 NOTE — Assessment & Plan Note (Signed)
With 3 exacerbations since April.   Still dyspneic at rest but not wheezing.  continue 02, prednisone, bronchodilators .  I have advised her to stop working .

## 2012-04-09 ENCOUNTER — Encounter: Payer: Self-pay | Admitting: Internal Medicine

## 2012-04-09 ENCOUNTER — Ambulatory Visit (INDEPENDENT_AMBULATORY_CARE_PROVIDER_SITE_OTHER): Payer: PRIVATE HEALTH INSURANCE | Admitting: Internal Medicine

## 2012-04-09 VITALS — BP 128/74 | HR 97 | Temp 98.3°F | Resp 16 | Wt 237.5 lb

## 2012-04-09 DIAGNOSIS — J441 Chronic obstructive pulmonary disease with (acute) exacerbation: Secondary | ICD-10-CM

## 2012-04-09 LAB — CBC WITH DIFFERENTIAL/PLATELET
Basophils Absolute: 0 10*3/uL (ref 0.0–0.1)
Basophils Relative: 0.6 % (ref 0.0–3.0)
HCT: 41.1 % (ref 36.0–46.0)
Hemoglobin: 13.6 g/dL (ref 12.0–15.0)
Lymphs Abs: 3 10*3/uL (ref 0.7–4.0)
Monocytes Relative: 6.6 % (ref 3.0–12.0)
Neutro Abs: 4 10*3/uL (ref 1.4–7.7)
RDW: 14 % (ref 11.5–14.6)

## 2012-04-09 MED ORDER — BUDESONIDE-FORMOTEROL FUMARATE 160-4.5 MCG/ACT IN AERO: 2.0000 | INHALATION_SPRAY | Freq: Two times a day (BID) | RESPIRATORY_TRACT | Status: DC

## 2012-04-09 MED ORDER — PREDNISONE 20 MG PO TABS: ORAL_TABLET | ORAL | Status: DC

## 2012-04-09 MED ORDER — PREDNISONE (PAK) 10 MG PO TABS: ORAL_TABLET | ORAL | Status: DC

## 2012-04-09 MED ORDER — METHYLPREDNISOLONE ACETATE 80 MG/ML IJ SUSP
80.0000 mg | Freq: Once | INTRAMUSCULAR | Status: AC
  Administered 2012-04-09: 80 mg via INTRAMUSCULAR

## 2012-04-09 MED ORDER — CICLESONIDE 37 MCG/ACT NA AERS: 1.0000 | INHALATION_SPRAY | Freq: Two times a day (BID) | NASAL | Status: DC

## 2012-04-09 NOTE — Patient Instructions (Addendum)
Instructions for the taper.  Start with the 20 mg tablets   3 tablets twice daily for 3 days, then  2 tablets twice daily daily for 3 days,  Then  one tablet twice  daily for 3 days, then  one tablet daily for 3 days  Then start the 6 day usual taper   Substitute symbicort for advair for the next month  2 puffs twice daily Use the nasal spray twice daily for 2 weeks

## 2012-04-09 NOTE — Assessment & Plan Note (Addendum)
Recurrent , with last exacerbation treated less than 2 weeks ago .  She has not been symptom-free long enough to have pulmonary function test done this year. This is her fourth exacerbation since April. Changing her to Symbicort, adding a nasal steroid in prescribing a three-week steroid taper  this time. Because of her current exacerbations and decided to check her for IgA IgM and IgG deficiencies.

## 2012-04-09 NOTE — Progress Notes (Signed)
Patient ID: Catherine [name], female   DOB: [birth year], 56 y.o.   MRN: [id number] Patient Active Problem List  Diagnosis  . CAD (coronary artery disease)  . HTN (hypertension)  . Hyperlipidemia  . Tobacco abuse, in remission  . Screening for colon cancer  . Gastritis and duodenitis  . Screening for breast cancer  . GERD (gastroesophageal reflux disease)  . Obesity (BMI 30-39.9)  . Depression  . Anxiety  . COPD exacerbation  . Diabetes mellitus with complication    Subjective:  CC:   Chief Complaint  Patient presents with  . Cough    HPI:   Catherine Straub Whiteheadis a 56 y.o. female who presents with Increased shortness of breath and cough without sputum after finishing a steroid taper 10 days ago for recurrent COPD exacerbations without evidence of pneumonia by chest x-ray.  Patient's is having shorter in sure intervals all of the exacerbations following treatment.  She has been out of work since her last COPD exacerbation so she has no sick contacts  or precipitating factors for current symptoms.. She notes increased sweating but no fevers or chils. .     Past Medical History  Diagnosis Date  . Diabetes mellitus   . Leukocytosis   . Hyperlipidemia   . COPD (chronic obstructive pulmonary disease)   . Hyperglycemia   . Hypertension   . S/P cardiac catheterization March 2012    normal coronaries,  due to chest pain ([place])  . Tobacco abuse, in remission     quit oct during hospitalization   . Screening for colon cancer June 2011    polyps found, next one due 2014 ([facility])  . Gastritis and duodenitis June 2011    EGD  . Screening for breast cancer Jul 28 2011    normal  . GERD (gastroesophageal reflux disease)   . Obesity (BMI 30-39.9)   . Depression   . Anxiety     Past Surgical History  Procedure Date  . Cardiac catheterization 11/22/2010  . Abdominal hysterectomy   . Hemorrhoid surgery   . Abdominal hysterectomy   . Bilateral oophorectomy 1995    and TAH.   noncancerous reasons         The following portions of the patient's history were reviewed and updated as appropriate: Allergies, current medications, and problem list.    Review of Systems:  Positive for sweating wheezing and shortness of breath accompanied by cough.   Comprehensive review of systems was otherwise negative.   History   Social History  . Marital Status: Married    Spouse Name: N/A    Number of Children: 4  . Years of Education: N/A   Occupational History  . Secretary    Social History Main Topics  . Smoking status: Former Smoker -- 1.0 packs/day for 40 years    Types: Cigarettes    Quit date: 06/27/2011  . Smokeless tobacco: Never Used  . Alcohol Use: No  . Drug Use: No  . Sexually Active: Not on file   Other Topics Concern  . Not on file   Social History Narrative  . No narrative on file    Objective:  BP 128/74  Pulse 97  Temp 98.3 F (36.8 C) (Oral)  Resp 16  Wt 237 lb 8 oz (107.729 kg)  SpO2 95%  General appearance: alert, in no distress, cooperative and appears stated age Neck: no adenopathy, no carotid bruit, supple, symmetrical, trachea midline and thyroid not enlarged, symmetric, no tenderness/mass/nodules Back: symmetric,  no curvature. ROM normal. No CVA tenderness. Lungs: decreased air movement bilaterally with expiratory wheezing Heart: regular rate and rhythm, S1, S2 normal, no murmur, click, rub or gallop Abdomen: soft, non-tender; bowel sounds normal; no masses,  no organomegaly Pulses: 2+ and symmetric Skin: Skin color, texture, turgor normal. No rashes or lesions Lymph nodes: Cervical, supraclavicular, and axillary nodes normal.  Assessment and Plan:  COPD exacerbation Recurrent , with last exacerbation treated less than 2 weeks ago .  She has not been symptom-free long enough to have pulmonary function test done this year. This is her fourth exacerbation since April. Changing her to Symbicort, adding a nasal steroid  in prescribing a three-week steroid taper  this time. Because of her current exacerbations and decided to check her for IgA IgM and IgG deficiencies.    Updated Medication List Outpatient Encounter Prescriptions as of 04/09/2012  Medication Sig Dispense Refill  . albuterol (PROVENTIL HFA) 108 (90 BASE) MCG/ACT inhaler Inhale 2 puffs into the lungs every 6 (six) hours as needed for wheezing.  2 Inhaler  3  . albuterol (PROVENTIL) (2.5 MG/3ML) 0.083% nebulizer solution Take 3 mLs (2.5 mg total) by nebulization every 6 (six) hours as needed for wheezing.  75 mL  12  . ALPRAZolam (XANAX) 0.25 MG tablet Take 1 tablet (0.25 mg total) by mouth at bedtime as needed.  30 tablet  2  . aspirin 81 MG EC tablet Take 81 mg by mouth daily.        Marland Kitchen atorvastatin (LIPITOR) 80 MG tablet Take 1 tablet (80 mg total) by mouth daily.  30 tablet  11  . glipiZIDE (GLUCOTROL) 5 MG tablet Take 1 tablet (5 mg total) by mouth 2 (two) times daily.  60 tablet  11  . glucose blood (ACCU-CHEK SMARTVIEW) test strip Use as instructed  150 each  11  . insulin glargine (LANTUS) 100 UNIT/ML injection Inject 20 Units into the skin daily.  10 mL  5  . insulin NPH (HUMULIN N,NOVOLIN N) 100 UNIT/ML injection Inject into the skin. Sliding scale      . Insulin Syringe-Needle U-100 (INSULIN SYRINGE .5CC/31GX5/16") 31G X 5/16" 0.5 ML MISC As directed      . latanoprost (XALATAN) 0.005 % ophthalmic solution as directed.      . pantoprazole (PROTONIX) 40 MG tablet Take 40 mg by mouth daily.        Marland Kitchen PARoxetine (PAXIL) 10 MG tablet Take 10 mg by mouth every morning.        . promethazine-phenylephrine (PROMETHAZINE-PHENYLEPHRINE) 6.25-5 MG/5ML SYRP Take 5 mLs by mouth every 4 (four) hours as needed.  240 mL  0  . tiotropium (SPIRIVA) 18 MCG inhalation capsule Place 1 capsule (18 mcg total) into inhaler and inhale daily.  30 capsule  3  . DISCONTD: Fluticasone-Salmeterol (ADVAIR) 250-50 MCG/DOSE AEPB Inhale 1 puff into the lungs every 12  (twelve) hours.  60 each  5  . budesonide-formoterol (SYMBICORT) 160-4.5 MCG/ACT inhaler Inhale 2 puffs into the lungs 2 (two) times daily.  1 Inhaler  0  . Ciclesonide (ZETONNA) 37 MCG/ACT AERS Place 1 Squirt into the nose 2 (two) times daily.  4.7 g  0  . predniSONE (DELTASONE) 20 MG tablet For 3 days, then taper as directed  39 tablet  0  . predniSONE (STERAPRED UNI-PAK) 10 MG tablet 6 tablets on Day 1 , then reduce by 1 tablet daily until gone  21 tablet  0  . DISCONTD: ipratropium-albuterol (DUONEB) 0.5-2.5 (3) MG/3ML  SOLN Take 3 mLs by nebulization every 4 (four) hours as needed.        . methylPREDNISolone acetate (DEPO-MEDROL) injection 80 mg          Orders Placed This Encounter  Procedures  . IgG, IgA, IgM  . CBC with Differential    Return in about 2 weeks (around 04/23/2012).

## 2012-04-10 LAB — IGG, IGA, IGM: IgM, Serum: 169 mg/dL (ref 52–322)

## 2012-04-11 ENCOUNTER — Encounter: Payer: Self-pay | Admitting: Internal Medicine

## 2012-04-12 ENCOUNTER — Telehealth: Payer: Self-pay | Admitting: *Deleted

## 2012-04-12 NOTE — Telephone Encounter (Signed)
Patient is asking if she needs to change any of her medications. She says that she did increase her lantus like she was told to do Please advise.

## 2012-04-12 NOTE — Telephone Encounter (Signed)
Patient says that her blood sugars have been running in the 300's. She says that she increase

## 2012-04-12 NOTE — Telephone Encounter (Signed)
If her fastings are still in the 120 to 130 range then the lantus dose is ok.  She has used  Short acting insulin (Apidra) with a sliding scale in the past for mealtime coverage. If she still has the short acting insulin I would resume the sliding scale. If she is currently using the Apidra and still having sugars in the 300,  Have her increase the dose for  each level of the sliding scale by 5 units.

## 2012-04-12 NOTE — Telephone Encounter (Signed)
Patient notified

## 2012-04-14 ENCOUNTER — Inpatient Hospital Stay: Payer: Self-pay | Admitting: Specialist

## 2012-04-14 LAB — COMPREHENSIVE METABOLIC PANEL
Albumin: 3.4 g/dL (ref 3.4–5.0)
Alkaline Phosphatase: 143 U/L — ABNORMAL HIGH (ref 50–136)
Anion Gap: 9 (ref 7–16)
BUN: 20 mg/dL — ABNORMAL HIGH (ref 7–18)
Bilirubin,Total: 0.2 mg/dL (ref 0.2–1.0)
Calcium, Total: 8.5 mg/dL (ref 8.5–10.1)
Chloride: 107 mmol/L (ref 98–107)
Creatinine: 0.96 mg/dL (ref 0.60–1.30)
EGFR (African American): >60
Glucose: 186 mg/dL — ABNORMAL HIGH (ref 65–99)
SGOT(AST): 20 U/L (ref 15–37)
SGPT (ALT): 28 U/L (ref 12–78)
Sodium: 143 mmol/L (ref 136–145)
Total Protein: 7 g/dL (ref 6.4–8.2)

## 2012-04-14 LAB — CBC WITH DIFFERENTIAL/PLATELET
Basophil #: 0 10*3/uL (ref 0.0–0.1)
Basophil %: 0.3 %
Eosinophil #: 0 10*3/uL (ref 0.0–0.7)
HCT: 39 % (ref 35.0–47.0)
HGB: 12.9 g/dL (ref 12.0–16.0)
MCH: 28.7 pg (ref 26.0–34.0)
MCHC: 33.1 g/dL (ref 32.0–36.0)
MCV: 87 fL (ref 80–100)
Monocyte #: 1 x10 3/mm — ABNORMAL HIGH (ref 0.2–0.9)
Monocyte %: 5.9 %
Neutrophil #: 13.5 10*3/uL — ABNORMAL HIGH (ref 1.4–6.5)
WBC: 16.3 10*3/uL — ABNORMAL HIGH (ref 3.6–11.0)

## 2012-04-14 LAB — TROPONIN I
Troponin-I: <0.02 ng/mL
Troponin-I: <0.02 ng/mL

## 2012-04-15 ENCOUNTER — Telehealth: Payer: Self-pay | Admitting: Internal Medicine

## 2012-04-15 LAB — CBC WITH DIFFERENTIAL/PLATELET
Basophil %: 0.1 %
Eosinophil %: 0.1 %
HCT: 36.7 % (ref 35.0–47.0)
HGB: 12.4 g/dL (ref 12.0–16.0)
Lymphocyte #: 3.3 10*3/uL (ref 1.0–3.6)
MCH: 29.2 pg (ref 26.0–34.0)
MCV: 87 fL (ref 80–100)
Monocyte #: 1 x10 3/mm — ABNORMAL HIGH (ref 0.2–0.9)
Neutrophil #: 10.9 10*3/uL — ABNORMAL HIGH (ref 1.4–6.5)
Neutrophil %: 71.5 %
Platelet: 283 10*3/uL (ref 150–440)
RBC: 4.24 10*6/uL (ref 3.80–5.20)
WBC: 15.2 10*3/uL — ABNORMAL HIGH (ref 3.6–11.0)

## 2012-04-15 LAB — COMPREHENSIVE METABOLIC PANEL WITH GFR
Albumin: 3 g/dL — ABNORMAL LOW
Alkaline Phosphatase: 116 U/L
Anion Gap: 9
BUN: 19 mg/dL — ABNORMAL HIGH
Bilirubin,Total: 0.2 mg/dL
Calcium, Total: 8.4 mg/dL — ABNORMAL LOW
Chloride: 102 mmol/L
Co2: 30 mmol/L
Creatinine: 0.83 mg/dL
EGFR (African American): >60
EGFR (Non-African Amer.): >60
Glucose: 169 mg/dL — ABNORMAL HIGH
Osmolality: 287
Potassium: 3.6 mmol/L
SGOT(AST): 19 U/L
SGPT (ALT): 25 U/L
Sodium: 141 mmol/L
Total Protein: 6.1 g/dL — ABNORMAL LOW

## 2012-04-15 NOTE — Telephone Encounter (Signed)
Short term disability form in box

## 2012-04-19 ENCOUNTER — Ambulatory Visit: Payer: PRIVATE HEALTH INSURANCE | Admitting: Pulmonary Disease

## 2012-04-19 ENCOUNTER — Ambulatory Visit (INDEPENDENT_AMBULATORY_CARE_PROVIDER_SITE_OTHER): Payer: PRIVATE HEALTH INSURANCE | Admitting: Pulmonary Disease

## 2012-04-19 ENCOUNTER — Encounter: Payer: Self-pay | Admitting: Pulmonary Disease

## 2012-04-19 ENCOUNTER — Other Ambulatory Visit: Payer: Self-pay | Admitting: Pulmonary Disease

## 2012-04-19 VITALS — BP 130/88 | HR 93 | Temp 97.8°F | Ht 63.0 in | Wt 238.0 lb

## 2012-04-19 DIAGNOSIS — R079 Chest pain, unspecified: Secondary | ICD-10-CM

## 2012-04-19 DIAGNOSIS — R0602 Shortness of breath: Secondary | ICD-10-CM

## 2012-04-19 DIAGNOSIS — J449 Chronic obstructive pulmonary disease, unspecified: Secondary | ICD-10-CM

## 2012-04-19 DIAGNOSIS — J441 Chronic obstructive pulmonary disease with (acute) exacerbation: Secondary | ICD-10-CM

## 2012-04-19 NOTE — Assessment & Plan Note (Addendum)
Debrorah has not really recovered since her hospital discharge last week. She still has significant shortness of breath, cough, and chest congestion. My review of her chest x-ray from last week shows increasing airspace disease in the right base. This combined with her sweats makes me concerned for an infectious process such as community acquired pneumonia. She does not have symptoms consistent with angina and apparently she was ruled out for an MI last week. There is much less likely I think she needs to be evaluated for pulmonary embolism.  She has had a fairly rapid decline over the last several months. I suspect that this is do to her advanced COPD and her obesity. Hopefully with antibiotics we'll see some improvement and then she can start exercising so that she can lose weight and start gaining ground again. However if she's not improved in 2 weeks then we may need to consider working her up for an alternative cause of dyspnea such as pulmonary hypertension.  Plan: CT angiogram chest at [facility] to evaluate for pulmonary embolism Avelox 400 mg by mouth daily for 6 days Continue prednisone at 20 mg daily for 2 more days then 10 mg daily until the end of the week Continue oxygen at 2 L continuously Call us if no improvement in 2-3 days Continue spiriva, symbicort, and nebulizers Continue Darilesp Followup with [place] in 2 weeks; and

## 2012-04-19 NOTE — Progress Notes (Signed)
Subjective:    Patient ID: Catherine [name], female    DOB: [birth year], 56 y.o.   MRN: [id number]  Synopsis: Ms. Fogal first saw the [facility] clinic in June 2013 for GOLD Grade D COPD.  She has frequent exacerbations, poor functional status, and her FEV1 is 0.91 L (41% pred).  She smoked 1 ppd for 40 years and quit in 2012.  HPI  04/19/12 ROV -- since her last visit Ms. Morabito was admitted to [facility] for shortness of breath. I do not have record of this yet but apparently she ruled out for an MI and was treated with steroids. Since her hospital discharge she says that she has had no improvement in her shortness of breath. She continues to have cough without sputum production and she notes chest congestion and chest tightness. She has not had leg swelling. She does not have fever or chills but she has noted frequent sweats. She continues to use oxygen at 2 L continuously which was started during this hospitalization and she is still on a prednisone taper (currently taking 20 mg daily).   Past Medical History  Diagnosis Date  . Diabetes mellitus   . Leukocytosis   . Hyperlipidemia   . COPD (chronic obstructive pulmonary disease)   . Hyperglycemia   . Hypertension   . S/P cardiac catheterization March 2012    normal coronaries,  due to chest pain ([place])  . Tobacco abuse, in remission     quit oct during hospitalization   . Screening for colon cancer June 2011    polyps found, next one due 2014 ([facility])  . Gastritis and duodenitis June 2011    EGD  . Screening for breast cancer Jul 28 2011    normal  . GERD (gastroesophageal reflux disease)   . Obesity (BMI 30-39.9)   . Depression   . Anxiety     Review of Systems  Constitutional: Negative for fever, chills and unexpected weight change.  HENT: Negative for nosebleeds, congestion, rhinorrhea and postnasal drip.   Respiratory: Positive for cough and shortness of breath. Negative for choking.   Cardiovascular: Negative  for chest pain and leg swelling.       Objective:   Physical Exam   Filed Vitals:   04/19/12 1651  BP: 130/88  Pulse: 93  Temp: 97.8 F (36.6 C)  TempSrc: Oral  Height: 5\' 3"  (1.6 m)  Weight: 238 lb (107.956 kg)  SpO2: 98%   Gen: obese, speaking in full sentences HEENT: NCAT, PERRL, EOMi, OP clear, neck supple without masses PULM: Diminished air movement, some exp wheezing noted CV: RRR, no mgr, no JVD AB: BS+, soft, nontender, no hsm Ext: warm, trace edema, no clubbing, no cyanosis   09/2011 and 12/2011 CXR from [facility] reviewed: no clear parenchymal abnormality, some vascular congestion  04/2012 pulmonary function test ratio 50%, FEV1 0.92 L (41%), TLC 3.39 L (72% predicted) ERV (15% predicted), DLCO 51% predicted     Assessment & Plan:   COPD exacerbation Debrorah has not really recovered since her hospital discharge last week. She still has significant shortness of breath, cough, and chest congestion. My review of her chest x-ray from last week shows increasing airspace disease in the right base. This combined with her sweats makes me concerned for an infectious process such as community acquired pneumonia. She does not have symptoms consistent with angina and apparently she was ruled out for an MI last week. There is much less likely I think she needs to  be evaluated for pulmonary embolism.  She has had a fairly rapid decline over the last several months. I suspect that this is do to her advanced COPD and her obesity. Hopefully with antibiotics we'll see some improvement and then she can start exercising so that she can lose weight and start gaining ground again. However if she's not improved in 2 weeks then we may need to consider working her up for an alternative cause of dyspnea such as pulmonary hypertension.  Plan: CT angiogram chest at [facility] to evaluate for pulmonary embolism Avelox 400 mg by mouth daily for 6 days Continue prednisone at 20 mg daily for 2 more days then  10 mg daily until the end of the week Continue oxygen at 2 L continuously Call us if no improvement in 2-3 days Continue spiriva, symbicort, and nebulizers Continue Darilesp Followup with [place] in 2 weeks; and    Updated Medication List Outpatient Encounter Prescriptions as of 04/19/2012  Medication Sig Dispense Refill  . albuterol (PROVENTIL HFA) 108 (90 BASE) MCG/ACT inhaler Inhale 2 puffs into the lungs every 6 (six) hours as needed for wheezing.  2 Inhaler  3  . albuterol (PROVENTIL) (2.5 MG/3ML) 0.083% nebulizer solution Take 3 mLs (2.5 mg total) by nebulization every 6 (six) hours as needed for wheezing.  75 mL  12  . ALPRAZolam (XANAX) 0.25 MG tablet Take 1 tablet (0.25 mg total) by mouth at bedtime as needed.  30 tablet  2  . aspirin 81 MG EC tablet Take 81 mg by mouth daily.        Marland Kitchen atorvastatin (LIPITOR) 80 MG tablet Take 1 tablet (80 mg total) by mouth daily.  30 tablet  11  . budesonide-formoterol (SYMBICORT) 160-4.5 MCG/ACT inhaler Inhale 2 puffs into the lungs 2 (two) times daily.  1 Inhaler  0  . Ciclesonide (ZETONNA) 37 MCG/ACT AERS Place 1 Squirt into the nose 2 (two) times daily.  4.7 g  0  . glipiZIDE (GLUCOTROL) 5 MG tablet Take 1 tablet (5 mg total) by mouth 2 (two) times daily.  60 tablet  11  . glucose blood (ACCU-CHEK SMARTVIEW) test strip Use as instructed  150 each  11  . insulin glargine (LANTUS) 100 UNIT/ML injection Inject 20 Units into the skin daily.  10 mL  5  . insulin NPH (HUMULIN N,NOVOLIN N) 100 UNIT/ML injection Inject into the skin. Sliding scale      . Insulin Syringe-Needle U-100 (INSULIN SYRINGE .5CC/31GX5/16") 31G X 5/16" 0.5 ML MISC As directed      . latanoprost (XALATAN) 0.005 % ophthalmic solution as directed.      . pantoprazole (PROTONIX) 40 MG tablet Take 40 mg by mouth daily.        Marland Kitchen PARoxetine (PAXIL) 10 MG tablet Take 10 mg by mouth every morning.        . predniSONE (DELTASONE) 20 MG tablet For 3 days, then taper as directed  39 tablet  0    . predniSONE (STERAPRED UNI-PAK) 10 MG tablet 6 tablets on Day 1 , then reduce by 1 tablet daily until gone  21 tablet  0  . promethazine-phenylephrine (PROMETHAZINE-PHENYLEPHRINE) 6.25-5 MG/5ML SYRP Take 5 mLs by mouth every 4 (four) hours as needed.  240 mL  0  . tiotropium (SPIRIVA) 18 MCG inhalation capsule Place 1 capsule (18 mcg total) into inhaler and inhale daily.  30 capsule  3

## 2012-04-19 NOTE — Addendum Note (Signed)
Addended by: Darrell Jewel on: 04/19/2012 05:43 PM   Modules accepted: Orders

## 2012-04-19 NOTE — Patient Instructions (Addendum)
Take the Avelox one pill daily for 6 days Use Mucinex over the counter as needed for cough and congestion Continue taking 20mg  of the prednisone for the next two days, then 10mg  daily for two days, then off. You will have a CT scan of your chest tomorrow morning at [facility] to evaluate your shortness of breath Keep using your oxygen at 2 L continuously as you are doing. Call us if you are not improving in 2-3 days or sooner if you get worse. We will see you back in 2 weeks or sooner if needed.

## 2012-04-20 ENCOUNTER — Telehealth: Payer: Self-pay | Admitting: Sports Medicine

## 2012-04-20 ENCOUNTER — Telehealth: Payer: Self-pay | Admitting: Pulmonary Disease

## 2012-04-20 ENCOUNTER — Ambulatory Visit: Payer: Self-pay | Admitting: Pulmonary Disease

## 2012-04-20 NOTE — Telephone Encounter (Signed)
Error.Catherine [name] ° °

## 2012-04-20 NOTE — Telephone Encounter (Signed)
Per previous msg just pulled results off fax will fax to dr Darrick Huntsman.[name]

## 2012-04-20 NOTE — Telephone Encounter (Signed)
Spoke with [place].  States we did received CT Chest with contrast results.  Dorann Lodge has faxed these to Dr. Darrick Huntsman and placed results in Dr. Ulyses Jarred look at.  I spoke with Dr. Kendrick Fries.  Informed of above.  He verbalized understanding.

## 2012-04-23 ENCOUNTER — Ambulatory Visit: Payer: PRIVATE HEALTH INSURANCE | Admitting: Internal Medicine

## 2012-04-23 ENCOUNTER — Other Ambulatory Visit: Payer: Self-pay | Admitting: Internal Medicine

## 2012-04-23 MED ORDER — PAROXETINE HCL 10 MG PO TABS: 10.0000 mg | ORAL_TABLET | ORAL | Status: DC

## 2012-04-27 ENCOUNTER — Ambulatory Visit (INDEPENDENT_AMBULATORY_CARE_PROVIDER_SITE_OTHER): Payer: PRIVATE HEALTH INSURANCE | Admitting: Internal Medicine

## 2012-04-27 ENCOUNTER — Encounter: Payer: Self-pay | Admitting: Internal Medicine

## 2012-04-27 VITALS — BP 122/74 | HR 88 | Temp 98.3°F | Resp 16 | Wt 236.5 lb

## 2012-04-27 DIAGNOSIS — E669 Obesity, unspecified: Secondary | ICD-10-CM

## 2012-04-27 DIAGNOSIS — J449 Chronic obstructive pulmonary disease, unspecified: Secondary | ICD-10-CM

## 2012-04-27 NOTE — Patient Instructions (Addendum)
Consider a Low Glycemic Index Diet and eating 6 smaller meals daily .  This frequent feeding stimulates your metabolism and the lower glycemic index foods will lower your blood sugars:   This is an example of my daily  "Low GI"  Diet:  All of the foods can be found at grocery stores and in bulk at [employer]  club   7 AM Breakfast:  Low carbohydrate Protein  Shakes (I recommend the EAS AdvantEdge "Carb Control" shakes  Or the low carb shakes by Atkins.   Both are available everywhere:  In  cases at [employer]  Or in 4 packs at grocery stores and pharmacies  2.5 carbs  (Alternative is  a toasted Arnold's Sandwhich Thin w/ peanut butter, a "Bagel Thin" with cream cheese and salmon) or  a scrambled egg burrito made with a low carb tortilla .  Avoid cereal and bananas, oatmeal too unless the old fashioned kind that takes 30-40 minutes to prepare.  the rest is overly processed, has minimal fiber, and loaded with carbohydrates!   10 AM: Protein bar by Atkins (the snack size, under 200 cal.  There are many varieties , available widely again or in bulk in limited varieties at [employer])  Other so called "protein bars" tend to be loaded with carbohydrates.  Remember, in food advertising, the word "energy" is synonymous for " carbohydrate."  Lunch: sandwich of turkey, (or any lunchmeat or canned tuna), fresh avocado and cheese on a lower carbohydrate pita bread, flatbread, or tortilla . Ok to use mayonnaise. The bread is the only source or carbohydrate that can be decreased (Joseph's makes a pita bread and a flat bread  Are 50 cal and 4 net carbs ; Toufayan makes a low carb flatbread 100 cal and 9 net carbs  and  Mission makes a low carb whole wheat tortilla  210 cal and 6 net carbs)  3 PM:  Mid day :  Another protein bar,  Or a  cheese stick (100 cal, 0 carbs),  Or 1 ounce of  almonds, walnuts, pistachios, pecans, peanuts,  Macadamia nuts. Or a Dannon light n Fit greek yogurt, 80 cal 8 net carbs . Avoid "granola"; the dried  cranberries and raisins are loaded with carbohydrates.    6 PM  Dinner:  "mean and green:"  Meat/chicken/fish or a high protein legume; , with a green salad, and a low GI  Veggie (broccoli, cauliflower, green beans, spinach, brussel sprouts. Lima beans) : Avoid "Low fat dressings, [place] and [place]! They are loaded with sugar! Instead use ranch, vinagrette,  Blue cheese, etc  9 PM snack : Breyer's "low carb" fudgsicle or  ice cream bar (Carb Smart line), or  Weight Watcher's ice cream bar , or anouther "no sugar added" ice cream; or another protein shake or a serving of fresh fruit with whipped cream (Avoid bananas, pineapple, grapes  and watermelon on a regular basis because they are high in sugar)   Remember that snack Substitutions should be less than 15 to 20 carbs  Per serving. Remember to subtract fiber grams to get the "net carbs." 

## 2012-04-27 NOTE — Progress Notes (Signed)
Patient ID: KEILA TURAN, female   DOB: [birth year], 56 y.o.   MRN: [id number]   Patient Active Problem List  Diagnosis  . CAD (coronary artery disease)  . HTN (hypertension)  . Hyperlipidemia  . Tobacco abuse, in remission  . Screening for colon cancer  . Gastritis and duodenitis  . Screening for breast cancer  . GERD (gastroesophageal reflux disease)  . Obesity (BMI 30-39.9)  . Depression  . Anxiety  . Diabetes mellitus with complication  . COPD (chronic obstructive pulmonary disease)    Subjective:  CC:   Chief Complaint  Patient presents with  . Follow-up    2 week    HPI:   ETHELREDA SUKHU a 56 y.o. female who presents follow up on COPD with persistent dyspnea and multiple hospitalizations since April 2013. She was discharged home 2-3 weeks ago and saw Dr. Kendrick Fries last week. Pulmonary embolism was ruled out with chest CT. She was started on Levaquin for suspicion of pneumonia. Breathing has finally improved at rest.  Still shorth of breath with exertion but no longer 02 dependent at rest.  Results of CT scan done last week given to patient. (She was not called earlier).  Interested in losing weight but not able to exercise now due to COPD. Has been to Diabetic teaching and has been told to reduce carb content of each meal to 15 carbs or less.  Uses instant oatmeal in the morning and scrambled egg as a typical breakfast.  Familiar with term "glycemicn index" but has never tried a low glycemic index diet.     Past Medical History  Diagnosis Date  . Diabetes mellitus   . Leukocytosis   . Hyperlipidemia   . COPD (chronic obstructive pulmonary disease)   . Hyperglycemia   . Hypertension   . S/P cardiac catheterization March 2012    normal coronaries,  due to chest pain ([facility])  . Tobacco abuse, in remission     quit oct during hospitalization   . Screening for colon cancer June 2011    polyps found, next one due 2014 ([facility])  . Gastritis and duodenitis  June 2011    EGD  . Screening for breast cancer Jul 28 2011    normal  . GERD (gastroesophageal reflux disease)   . Obesity (BMI 30-39.9)   . Depression   . Anxiety     Past Surgical History  Procedure Date  . Cardiac catheterization 11/22/2010    no significant disease  . Abdominal hysterectomy   . Hemorrhoid surgery   . Abdominal hysterectomy   . Bilateral oophorectomy 1995    and TAH.  noncancerous reasons         The following portions of the patient's history were reviewed and updated as appropriate: Allergies, current medications, and problem list.    Review of Systems:   A comprehensive ROS was done and positive for exertional dyspnea.   The rest was negative.   History   Social History  . Marital Status: Married    Spouse Name: N/A    Number of Children: 4  . Years of Education: N/A   Occupational History  . Secretary    Social History Main Topics  . Smoking status: Former Smoker -- 1.0 packs/day for 40 years    Types: Cigarettes    Quit date: 06/27/2011  . Smokeless tobacco: Never Used  . Alcohol Use: No  . Drug Use: No  . Sexually Active: Not on file   Other Topics  Concern  . Not on file   Social History Narrative  . No narrative on file    Objective:  BP 122/74  Pulse 88  Temp 98.3 F (36.8 C) (Oral)  Resp 16  Wt 236 lb 8 oz (107.276 kg)  SpO2 95%  General appearance: alert, cooperative and appears stated age Ears: normal TM's and external ear canals both ears Throat: lips, mucosa, and tongue normal; teeth and gums normal Neck: no adenopathy, no carotid bruit, supple, symmetrical, trachea midline and thyroid not enlarged, symmetric, no tenderness/mass/nodules Back: symmetric, no curvature. ROM normal. No CVA tenderness. Lungs: clear to auscultation bilaterally Heart: regular rate and rhythm, S1, S2 normal, no murmur, click, rub or gallop Abdomen: soft, non-tender; bowel sounds normal; no masses,  no organomegaly Pulses: 2+ and  symmetric Skin: Skin color, texture, turgor normal. No rashes or lesions Lymph nodes: Cervical, supraclavicular, and axillary nodes normal.  Assessment and Plan:  COPD (chronic obstructive pulmonary disease) Severe with her current exacerbations. She is finally off of O2 at rest. Her persistent symptoms are concerning for pulmonary hypertension. She had a cardiac catheterization in March of 2012 unfortunately pulmonary pressures were not measured. The cath showed no significant disease. Her dyspnea may be multifactorial due to advanced COPD and obesity with deconditioning. Referral to cardiopulmonary rehabilitation now in place.  Obesity (BMI 30-39.9) I have addressed  BMI and spent 20 minutes counselling and instructing her on a low glycemic index diet utilizing smaller more frequent meals to increase metabolism.  I have also recommended that patient start exercising with referral to cardiopulmonary Rehab referral underway    Updated Medication List Outpatient Encounter Prescriptions as of 04/27/2012  Medication Sig Dispense Refill  . albuterol (PROVENTIL HFA) 108 (90 BASE) MCG/ACT inhaler Inhale 2 puffs into the lungs every 6 (six) hours as needed for wheezing.  2 Inhaler  3  . ALPRAZolam (XANAX) 0.25 MG tablet Take 1 tablet (0.25 mg total) by mouth at bedtime as needed.  30 tablet  2  . aspirin 81 MG EC tablet Take 81 mg by mouth daily.        Marland Kitchen atorvastatin (LIPITOR) 80 MG tablet Take 1 tablet (80 mg total) by mouth daily.  30 tablet  11  . budesonide-formoterol (SYMBICORT) 160-4.5 MCG/ACT inhaler Inhale 2 puffs into the lungs 2 (two) times daily.  1 Inhaler  0  . Ciclesonide (ZETONNA) 37 MCG/ACT AERS Place 1 Squirt into the nose 2 (two) times daily.  4.7 g  0  . glipiZIDE (GLUCOTROL) 5 MG tablet Take 1 tablet (5 mg total) by mouth 2 (two) times daily.  60 tablet  11  . glucose blood (ACCU-CHEK SMARTVIEW) test strip Use as instructed  150 each  11  . insulin glargine (LANTUS) 100 UNIT/ML  injection Inject 20 Units into the skin daily.  10 mL  5  . insulin NPH (HUMULIN N,NOVOLIN N) 100 UNIT/ML injection Inject into the skin. Sliding scale      . Insulin Syringe-Needle U-100 (INSULIN SYRINGE .5CC/31GX5/16") 31G X 5/16" 0.5 ML MISC As directed      . ipratropium-albuterol (DUONEB) 0.5-2.5 (3) MG/3ML SOLN Take 3 mLs by nebulization every 4 (four) hours as needed.      . latanoprost (XALATAN) 0.005 % ophthalmic solution as directed.      . pantoprazole (PROTONIX) 40 MG tablet Take 40 mg by mouth daily.        Marland Kitchen PARoxetine (PAXIL) 10 MG tablet Take 1 tablet (10 mg total) by mouth  every morning.  30 tablet  3  . predniSONE (DELTASONE) 20 MG tablet For 3 days, then taper as directed  39 tablet  0  . promethazine-phenylephrine (PROMETHAZINE-PHENYLEPHRINE) 6.25-5 MG/5ML SYRP Take 5 mLs by mouth every 4 (four) hours as needed.  240 mL  0  . tiotropium (SPIRIVA) 18 MCG inhalation capsule Place 1 capsule (18 mcg total) into inhaler and inhale daily.  30 capsule  3     Orders Placed This Encounter  Procedures  . Ambulatory referral to Physical Therapy    No Follow-up on file.

## 2012-04-28 ENCOUNTER — Encounter: Payer: Self-pay | Admitting: Internal Medicine

## 2012-04-28 DIAGNOSIS — J449 Chronic obstructive pulmonary disease, unspecified: Secondary | ICD-10-CM | POA: Insufficient documentation

## 2012-04-28 DIAGNOSIS — J439 Emphysema, unspecified: Secondary | ICD-10-CM | POA: Insufficient documentation

## 2012-04-28 NOTE — Assessment & Plan Note (Signed)
Severe with her current exacerbations. She is finally off of O2 at rest. Her persistent symptoms are concerning for pulmonary hypertension. She had a cardiac catheterization in March of 2012 unfortunately pulmonary pressures were not measured. The cath showed no significant disease. Her dyspnea may be multifactorial due to advanced COPD and obesity with deconditioning. Referral to cardiopulmonary rehabilitation now in place.

## 2012-04-28 NOTE — Assessment & Plan Note (Signed)
I have addressed  BMI and spent 20 minutes counselling and instructing her on a low glycemic index diet utilizing smaller more frequent meals to increase metabolism.  I have also recommended that patient start exercising with referral to cardiopulmonary Rehab referral underway

## 2012-05-03 ENCOUNTER — Encounter: Payer: Self-pay | Admitting: Pulmonary Disease

## 2012-05-03 ENCOUNTER — Ambulatory Visit (INDEPENDENT_AMBULATORY_CARE_PROVIDER_SITE_OTHER): Payer: PRIVATE HEALTH INSURANCE | Admitting: Pulmonary Disease

## 2012-05-03 VITALS — BP 132/80 | HR 80 | Temp 98.2°F | Ht 63.0 in | Wt 236.0 lb

## 2012-05-03 DIAGNOSIS — R05 Cough: Secondary | ICD-10-CM

## 2012-05-03 DIAGNOSIS — R059 Cough, unspecified: Secondary | ICD-10-CM | POA: Insufficient documentation

## 2012-05-03 DIAGNOSIS — J9611 Chronic respiratory failure with hypoxia: Secondary | ICD-10-CM | POA: Insufficient documentation

## 2012-05-03 DIAGNOSIS — Z0279 Encounter for issue of other medical certificate: Secondary | ICD-10-CM

## 2012-05-03 DIAGNOSIS — J9610 Chronic respiratory failure, unspecified whether with hypoxia or hypercapnia: Secondary | ICD-10-CM

## 2012-05-03 DIAGNOSIS — J449 Chronic obstructive pulmonary disease, unspecified: Secondary | ICD-10-CM

## 2012-05-03 MED ORDER — PANTOPRAZOLE SODIUM 40 MG PO TBEC: 40.0000 mg | DELAYED_RELEASE_TABLET | Freq: Two times a day (BID) | ORAL | Status: DC

## 2012-05-03 MED ORDER — TRAMADOL HCL 50 MG PO TABS: 50.0000 mg | ORAL_TABLET | Freq: Two times a day (BID) | ORAL | Status: DC | PRN

## 2012-05-03 MED ORDER — ROFLUMILAST 500 MCG PO TABS: 500.0000 ug | ORAL_TABLET | ORAL | Status: DC

## 2012-05-03 NOTE — Assessment & Plan Note (Addendum)
Unfortunately Catherine [name] continues to struggle with her COPD. I explained to her today that based on her pulmonary function testing performed this month she has severe disease and based on her recurrent exacerbations she is in the highest risk category by GOLD criteria. She is struggling to recover from several severe exacerbations and with her morbid obesity and deconditioning we are fighting an uphill battle.  Today she is not wheezing and by history it sounds that the biggest problem she's had in the last 2 weeks has been cough which leads to episodes of shortness of breath. I do not think she'll benefit from more steroid therapy today.  Plan: -Continue her inhaler therapy which is currently "maxed out" -Continue Roflumilast -Continue oxygen as written. -Start pulmonary rehabilitation as soon as possible -See cough

## 2012-05-03 NOTE — Assessment & Plan Note (Signed)
She continues to use and benefit from 2 L of oxygen continuously. Plan:  -continue 2 L of oxygen continuously

## 2012-05-03 NOTE — Assessment & Plan Note (Signed)
Her cough is really been a problem for her in the last 2 weeks. Think that this is likely due to the fact she has underlying COPD and is recovering from exacerbations. I do not think that she is in acute exacerbation right now. Postnasal drip and acid reflux disease are likely contributing to this ongoing cough.  Plan: -Start Lloyd Huger med rinses and over-the-counter antihistamines and decongestants -Double dose of her PPI therapy -Reviewed acid reflux lifestyle modifications. -Use tramadol twice a day as needed for severe coughing paroxysms

## 2012-05-03 NOTE — Patient Instructions (Addendum)
Increase the dose of your protonix to twice per day and follow the lifestyle modifications for reflux that we have provided you. Use Neil Med rinses with distilled water at least twice per day using the instructions on the package. 1/2 hour after using the [facility] Med rinse, use Ciclesonide two puffs in each nostril once per day. (If you cannot tolerate the Lloyd Huger Med rinse, then ocean spray three or four times per day is a good substitute.) Use chlortrimeton and an over the counter decongestant (pseudophed is the best option, but ask the pharmacist for a recommendation) as needed for the cough. Use the tramadol 50mg  by mouth twice a day as needed for cough. Try using a hard candy to help suppress the cough.  After three days of the above regimen, try to back off on the tramadol. Keep using your other medications as prescribed. Start pulmonary rehab as prescribed.  We will see you back in 2 months or sooner if needed.

## 2012-05-03 NOTE — Progress Notes (Signed)
Subjective:    Patient ID: Catherine [name], female    DOB: March 05, 1956, 56 y.o.   MRN: [id number]  Synopsis: Catherine [name] first saw the [facility] in June 2013 for GOLD Grade D COPD.  She has frequent exacerbations, poor functional status, and her FEV1 is 0.91 L (41% pred).  She smoked 1 ppd for 40 years and quit in 2012.  HPI  04/19/12 ROV -- since her last visit Catherine [name] was admitted to [facility] for shortness of breath. I do not have record of this yet but apparently she ruled out for an MI and was treated with steroids. Since her hospital discharge she says that she has had no improvement in her shortness of breath. She continues to have cough without sputum production and she notes chest congestion and chest tightness. She has not had leg swelling. She does not have fever or chills but she has noted frequent sweats. She continues to use oxygen at 2 L continuously which was started during this hospitalization and she is still on a prednisone taper (currently taking 20 mg daily).  05/03/12 ROV --Since her last visit Catherine [name] says that she initially improved with the moxifloxacin therapy that we wrote. However she is still having a lot of trouble with cough and her coughing spells are causing significant shortness of breath and wheezing. She states that when she is at rest she does not wheeze or have shortness of breath. However when she starts coughing her shortness of breath becomes quite significant. She has noted more sinus congestion and sneezing which she thinks may be due to allergies. She notes some indigestion at home and takes protonix for acid reflux. She has not had fevers or chills since her hospitalization. She has chest tightness and some body cramps but has not had significant swelling in her feet.    Past Medical History  Diagnosis Date  . Diabetes mellitus   . Leukocytosis   . Hyperlipidemia   . COPD (chronic obstructive pulmonary disease)   . Hyperglycemia     . Hypertension   . S/P cardiac catheterization March 2012    normal coronaries,  due to chest pain ([place])  . Tobacco abuse, in remission     quit oct during hospitalization   . Screening for colon cancer June 2011    polyps found, next one due 2014 ([facility])  . Gastritis and duodenitis June 2011    EGD  . Screening for breast cancer Jul 28 2011    normal  . GERD (gastroesophageal reflux disease)   . Obesity (BMI 30-39.9)   . Depression   . Anxiety     Review of Systems  Constitutional: Negative for fever, chills and unexpected weight change.  HENT: Positive for rhinorrhea and sneezing. Negative for nosebleeds, congestion and postnasal drip.   Respiratory: Positive for cough and shortness of breath. Negative for choking.   Cardiovascular: Negative for chest pain and leg swelling.       Objective:   Physical Exam   Filed Vitals:   05/03/12 1348  BP: 132/80  Pulse: 80  Temp: 98.2 F (36.8 C)  TempSrc: Oral  Height: 5\' 3"  (1.6 m)  Weight: 236 lb (107.049 kg)  SpO2: 97%   Gen: obese, speaking in full sentences HEENT: NCAT, PERRL, EOMi, OP clear, neck supple without masses PULM: CTA B, no wheezing CV: RRR, no mgr, no JVD AB: BS+, soft, nontender, no hsm Ext: warm, no edema, no clubbing, no cyanosis   09/2011  and 12/2011 CX[facility] from [facility] reviewed: no clear parenchymal abnormality, some vascular congestion  04/2012 pulmonary function test ratio 50%, FEV1 0.92 L (41%), TLC 3.39 L (72% predicted) ERV (15% predicted), DLCO 51% predicted  04/2012 CT Chest: no PE, no infiltrate, no evidence of fibrosis     Assessment & Plan:   COPD (chronic obstructive pulmonary disease) Unfortunately Catherine [name] continues to struggle with her COPD. I explained to her today that based on her pulmonary function testing performed this month she has severe disease and based on her recurrent exacerbations she is in the highest risk category by GOLD criteria. She is struggling to recover from several  severe exacerbations and with her morbid obesity and deconditioning we are fighting an uphill battle.  Today she is not wheezing and by history it sounds that the biggest problem she's had in the last 2 weeks has been cough which leads to episodes of shortness of breath. I do not think she'll benefit from more steroid therapy today.  Plan: -Continue her inhaler therapy which is currently "maxed out" -Continue Roflumilast -Continue oxygen as written. -Start pulmonary rehabilitation as soon as possible -See cough  Cough Her cough is really been a problem for her in the last 2 weeks. Think that this is likely due to the fact she has underlying COPD and is recovering from exacerbations. I do not think that she is in acute exacerbation right now. Postnasal drip and acid reflux disease are likely contributing to this ongoing cough.  Plan: -Start Lloyd Huger med rinses and over-the-counter antihistamines and decongestants -Double dose of her PPI therapy -Reviewed acid reflux lifestyle modifications. -Use tramadol twice a day as needed for severe coughing paroxysms  Chronic hypoxemic respiratory failure She continues to use and benefit from 2 L of oxygen continuously. Plan:  -continue 2 L of oxygen continuously    Updated Medication List Outpatient Encounter Prescriptions as of 05/03/2012  Medication Sig Dispense Refill  . albuterol (PROVENTIL HFA) 108 (90 BASE) MCG/ACT inhaler Inhale 2 puffs into the lungs every 6 (six) hours as needed for wheezing.  2 Inhaler  3  . ALPRAZolam (XANAX) 0.25 MG tablet Take 1 tablet (0.25 mg total) by mouth at bedtime as needed.  30 tablet  2  . aspirin 81 MG EC tablet Take 81 mg by mouth daily.        Marland Kitchen atorvastatin (LIPITOR) 80 MG tablet Take 1 tablet (80 mg total) by mouth daily.  30 tablet  11  . budesonide-formoterol (SYMBICORT) 160-4.5 MCG/ACT inhaler Inhale 2 puffs into the lungs 2 (two) times daily.  1 Inhaler  0  . Ciclesonide (ZETONNA) 37 MCG/ACT AERS  Place 1 Squirt into the nose 2 (two) times daily.  4.7 g  0  . docusate sodium (COLACE) 100 MG capsule Take 200 mg by mouth daily.      Marland Kitchen glipiZIDE (GLUCOTROL) 5 MG tablet Take 1 tablet (5 mg total) by mouth 2 (two) times daily.  60 tablet  11  . glucose blood (ACCU-CHEK SMARTVIEW) test strip Use as instructed  150 each  11  . guaiFENesin (MUCINEX) 600 MG 12 hr tablet Take 600 mg by mouth daily.      . insulin glargine (LANTUS) 100 UNIT/ML injection Inject 20 Units into the skin daily.  10 mL  5  . Insulin Syringe-Needle U-100 (INSULIN SYRINGE .5CC/31GX5/16") 31G X 5/16" 0.5 ML MISC As directed      . ipratropium-albuterol (DUONEB) 0.5-2.5 (3) MG/3ML SOLN Take 3 mLs by nebulization every  4 (four) hours as needed.      . pantoprazole (PROTONIX) 40 MG tablet Take 40 mg by mouth daily.        . Potassium Gluconate 595 MG CAPS Take 1 capsule by mouth daily.      . promethazine-phenylephrine (PROMETHAZINE-PHENYLEPHRINE) 6.25-5 MG/5ML SYRP Take 5 mLs by mouth every 4 (four) hours as needed.  240 mL  0  . tiotropium (SPIRIVA) 18 MCG inhalation capsule Place 1 capsule (18 mcg total) into inhaler and inhale daily.  30 capsule  3  . DISCONTD: insulin NPH (HUMULIN N,NOVOLIN N) 100 UNIT/ML injection Inject into the skin. Sliding scale      . DISCONTD: latanoprost (XALATAN) 0.005 % ophthalmic solution as directed.      Marland Kitchen DISCONTD: PARoxetine (PAXIL) 10 MG tablet Take 1 tablet (10 mg total) by mouth every morning.  30 tablet  3  . DISCONTD: predniSONE (DELTASONE) 20 MG tablet For 3 days, then taper as directed  39 tablet  0

## 2012-05-05 ENCOUNTER — Other Ambulatory Visit: Payer: Self-pay | Admitting: *Deleted

## 2012-05-05 NOTE — Telephone Encounter (Signed)
Opened in error

## 2012-05-18 ENCOUNTER — Encounter: Payer: Self-pay | Admitting: Pulmonary Disease

## 2012-05-18 ENCOUNTER — Telehealth: Payer: Self-pay | Admitting: Internal Medicine

## 2012-05-18 MED ORDER — BUDESONIDE-FORMOTEROL FUMARATE 160-4.5 MCG/ACT IN AERO: 2.0000 | INHALATION_SPRAY | Freq: Two times a day (BID) | RESPIRATORY_TRACT | Status: DC

## 2012-05-18 MED ORDER — CICLESONIDE 37 MCG/ACT NA AERS: 1.0000 | INHALATION_SPRAY | Freq: Two times a day (BID) | NASAL | Status: DC

## 2012-05-18 NOTE — Telephone Encounter (Signed)
Pt came in today she needs Aero chamber for her inhaler rx for symbicort and zentonnia [facility] Pt is out of zentonnia

## 2012-05-19 ENCOUNTER — Encounter: Payer: Self-pay | Admitting: Pulmonary Disease

## 2012-05-23 ENCOUNTER — Ambulatory Visit: Payer: Self-pay | Admitting: Medical

## 2012-05-26 ENCOUNTER — Other Ambulatory Visit: Payer: Self-pay | Admitting: Internal Medicine

## 2012-05-26 MED ORDER — ALPRAZOLAM 0.25 MG PO TABS: 0.2500 mg | ORAL_TABLET | Freq: Every evening | ORAL | Status: DC | PRN

## 2012-05-28 ENCOUNTER — Encounter: Payer: Self-pay | Admitting: Internal Medicine

## 2012-05-28 ENCOUNTER — Ambulatory Visit (INDEPENDENT_AMBULATORY_CARE_PROVIDER_SITE_OTHER): Payer: PRIVATE HEALTH INSURANCE | Admitting: Internal Medicine

## 2012-05-28 VITALS — BP 130/86 | HR 110 | Temp 98.4°F | Resp 20 | Wt 240.2 lb

## 2012-05-28 DIAGNOSIS — J441 Chronic obstructive pulmonary disease with (acute) exacerbation: Secondary | ICD-10-CM

## 2012-05-28 MED ORDER — TRAMADOL HCL 50 MG PO TABS: 50.0000 mg | ORAL_TABLET | Freq: Four times a day (QID) | ORAL | Status: DC | PRN

## 2012-05-28 MED ORDER — METHYLPREDNISOLONE ACETATE 80 MG/ML IJ SUSP
80.0000 mg | Freq: Once | INTRAMUSCULAR | Status: AC
  Administered 2012-05-28: 80 mg via INTRAMUSCULAR

## 2012-05-28 MED ORDER — PREDNISONE 10 MG PO TABS: ORAL_TABLET | ORAL | Status: DC

## 2012-05-28 MED ORDER — HYDROCOD POLST-CHLORPHEN POLST 10-8 MG/5ML PO LQCR: 5.0000 mL | Freq: Two times a day (BID) | ORAL | Status: DC | PRN

## 2012-05-28 NOTE — Progress Notes (Signed)
Patient ID: Catherine [name], female   DOB: [birth year], 56 y.o.   MRN: [id number]  Patient Active Problem List  Diagnosis  . CAD (coronary artery disease)  . HTN (hypertension)  . Hyperlipidemia  . Tobacco abuse, in remission  . Screening for colon cancer  . Gastritis and duodenitis  . Screening for breast cancer  . GERD (gastroesophageal reflux disease)  . Obesity (BMI 30-39.9)  . Depression  . Anxiety  . Diabetes mellitus with complication  . COPD (chronic obstructive pulmonary disease)  . Cough  . Chronic hypoxemic respiratory failure  . COPD exacerbation    Subjective:  CC:   Chief Complaint  Patient presents with  . Cough    HPI:   Catherine [name] a 56 y.o. female who presents with productive cough of 5 days' duration  accompanied by yellow sputum with occasional pink tinge and increased dyspnea with minimal exertion. Symptoms started 24 hours after her first cardiopulmonary rehabilitation session on Friday. She was treated at urgent care on Sunday with Levaquin and a six-day prednisone taper after receiving a Solu-Medrol injection. She describes her current Symptoms a as worse with regard to the cough. She has been using tramadol twice daily as directed by Dr. Kendrick Fries for control of cough. She does not feel like is helping. She has a history of itching with Tussionex but has been able to tolerate it without itching if she uses half of the prescribed dose. He denies chest pain and dizziness. She's not having any fevers. She is not short of breath if she sits still    Past Medical History  Diagnosis Date  . Diabetes mellitus   . Leukocytosis   . Hyperlipidemia   . COPD (chronic obstructive pulmonary disease)   . Hyperglycemia   . Hypertension   . S/P cardiac catheterization March 2012    normal coronaries,  due to chest pain ([place])  . Tobacco abuse, in remission     quit oct during hospitalization   . Screening for colon cancer June 2011    polyps found,  next one due 2014 ([facility])  . Gastritis and duodenitis June 2011    EGD  . Screening for breast cancer Jul 28 2011    normal  . GERD (gastroesophageal reflux disease)   . Obesity (BMI 30-39.9)   . Depression   . Anxiety     Past Surgical History  Procedure Date  . Cardiac catheterization 11/22/2010    no significant disease  . Abdominal hysterectomy   . Hemorrhoid surgery   . Abdominal hysterectomy   . Bilateral oophorectomy 1995    and TAH.  noncancerous reasons         The following portions of the patient's history were reviewed and updated as appropriate: Allergies, current medications, and problem list.    Review of Systems:   12 Pt  review of systems was negative except those addressed in the HPI,     History   Social History  . Marital Status: Married    Spouse Name: N/A    Number of Children: 4  . Years of Education: N/A   Occupational History  . Secretary    Social History Main Topics  . Smoking status: Former Smoker -- 1.0 packs/day for 40 years    Types: Cigarettes    Quit date: 06/27/2011  . Smokeless tobacco: Never Used  . Alcohol Use: No  . Drug Use: No  . Sexually Active: Not on file   Other Topics Concern  .  Not on file   Social History Narrative  . No narrative on file    Objective:  BP 130/86  Pulse 110  Temp 98.4 F (36.9 C) (Oral)  Resp 20  Wt 240 lb 4 oz (108.977 kg)  SpO2 97%  General appearance: alert, cooperative and appears stated age Ears: normal TM's and external ear canals both ears Throat: lips, mucosa, and tongue normal; teeth and gums normal Neck: no adenopathy, no carotid bruit, supple, symmetrical, trachea midline and thyroid not enlarged, symmetric, no tenderness/mass/nodules Back: symmetric, no curvature. ROM normal. No CVA tenderness. Lungs: clear to auscultation bilaterally Heart: regular rate and rhythm, S1, S2 normal, no murmur, click, rub or gallop Abdomen: soft, non-tender; bowel sounds normal;  no masses,  no organomegaly Pulses: 2+ and symmetric Skin: Skin color, texture, turgor normal. No rashes or lesions Lymph nodes: Cervical, supraclavicular, and axillary nodes normal.  Assessment and Plan:  COPD exacerbation Current exacerbation is of 5 days duration, with levaquin and 6 day prednisone taper  Prescribed by urgent care, which was apparently a taper too rapid for her. She is very bronchospastic today and. She is coughing with deep breathing. Ambulatory sats  are 96% with 2 L, dropped to 92%  butrecovered quickly with rest. She is able to demonstrate pursed lip breathing.. Will prescribe prednisone 60 mg twice a day x3 days and decrease by  10 mg every 2 daysy over 2 weeks. She has a few more days of  Levaquin she'll finish. 80 mg of Depo-Medrol  given here in the office. For cough control  we discussed alternating between the tussionex which she can tolerate at lower doses and the tramadol. 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 Follow up otherwise in one week.    Updated Medication List Outpatient Encounter Prescriptions as of 05/28/2012  Medication Sig Dispense Refill  . albuterol (PROVENTIL HFA) 108 (90 BASE) MCG/ACT inhaler Inhale 2 puffs into the lungs every 6 (six) hours as needed for wheezing.  2 Inhaler  3  . ALPRAZolam (XANAX) 0.25 MG tablet Take 1 tablet (0.25 mg total) by mouth at bedtime as needed.  30 tablet  2  . aspirin 81 MG EC tablet Take 81 mg by mouth daily.        Marland Kitchen atorvastatin (LIPITOR) 80 MG tablet Take 1 tablet (80 mg total) by mouth daily.  30 tablet  11  . budesonide-formoterol (SYMBICORT) 160-4.5 MCG/ACT inhaler Inhale 2 puffs into the lungs 2 (two) times daily.  10.2 g  6  . Ciclesonide (ZETONNA) 37 MCG/ACT AERS Place 1 Squirt into the nose 2 (two) times daily.  4.7 g  6  . docusate sodium (COLACE) 100 MG capsule Take 200 mg by mouth daily.      Marland Kitchen  glipiZIDE (GLUCOTROL) 5 MG tablet Take 1 tablet (5 mg total) by mouth 2 (two) times daily.  60 tablet  11  . glucose blood (ACCU-CHEK SMARTVIEW) test strip Use as instructed  150 each  11  . guaiFENesin (MUCINEX) 600 MG 12 hr tablet Take 600 mg by mouth daily.      . insulin glargine (LANTUS) 100 UNIT/ML injection Inject 20 Units into the skin daily.  10 mL  5  . Insulin Syringe-Needle U-100 (INSULIN SYRINGE .5CC/31GX5/16") 31G X 5/16" 0.5 ML MISC As directed      . ipratropium-albuterol (DUONEB) 0.5-2.5 (3) MG/3ML  SOLN Take 3 mLs by nebulization every 4 (four) hours as needed.      . Levofloxacin (LEVAQUIN PO) Take by mouth.      . pantoprazole (PROTONIX) 40 MG tablet Take 1 tablet (40 mg total) by mouth 2 (two) times daily.  60 tablet  2  . Potassium Gluconate 595 MG CAPS Take 1 capsule by mouth daily.      . predniSONE (STERAPRED UNI-PAK) 10 MG tablet Take 10 mg by mouth daily.      . promethazine-phenylephrine (PROMETHAZINE-PHENYLEPHRINE) 6.25-5 MG/5ML SYRP Take 5 mLs by mouth every 4 (four) hours as needed.  240 mL  0  . roflumilast (DALIRESP) 500 MCG TABS tablet Take 1 tablet (500 mcg total) by mouth every other day.  15 tablet  5  . tiotropium (SPIRIVA) 18 MCG inhalation capsule Place 1 capsule (18 mcg total) into inhaler and inhale daily.  30 capsule  3  . DISCONTD: traMADol (ULTRAM) 50 MG tablet Take 1 tablet (50 mg total) by mouth every 12 (twelve) hours as needed (cough).  20 tablet  1  . chlorpheniramine-HYDROcodone (TUSSIONEX) 10-8 MG/5ML LQCR Take 5 mLs by mouth every 12 (twelve) hours as needed.  180 mL  0  . predniSONE (DELTASONE) 10 MG tablet 6 tablets twice daily for 3 days,  Then once daily for 3 days ,then taper by 10 mg daily until gone  60 tablet  0  . traMADol (ULTRAM) 50 MG tablet Take 1 tablet (50 mg total) by mouth every 6 (six) hours as needed for pain (or cough).  120 tablet  1  . methylPREDNISolone acetate (DEPO-MEDROL) injection 80 mg          No orders of the  defined types were placed in this encounter.    Return in about 1 week (around 06/04/2012).

## 2012-05-28 NOTE — Patient Instructions (Addendum)
Take 60 mg of prednisone twice daily for 3 days, the once daily for 3 days  then begin the taper by 10 mg daily until gone  You may use tramadol every 6 hours for cough or the tussionex as needed,

## 2012-05-30 ENCOUNTER — Encounter: Payer: Self-pay | Admitting: Internal Medicine

## 2012-05-30 NOTE — Assessment & Plan Note (Addendum)
Current exacerbation is of 5 days duration, with levaquin and 6 day prednisone taper  Prescribed by urgent care, which was apparently a taper too rapid for her. She is very bronchospastic today and. She is coughing with deep breathing. Ambulatory sats  are 96% with 2 L, dropped to 92%  butrecovered quickly with rest. She is able to demonstrate pursed lip breathing.. Will prescribe prednisone 60 mg twice a day x3 days and decrease by  10 mg every 2 daysy over 2 weeks. She has a few more days of  Levaquin she'll finish. 80 mg of Depo-Medrol  given here in the office. For cough control  we discussed alternating between the tussionex which she can tolerate at lower doses and the tramadol. 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 Follow up otherwise in one week.

## 2012-06-08 ENCOUNTER — Encounter: Payer: Self-pay | Admitting: Pulmonary Disease

## 2012-06-17 ENCOUNTER — Ambulatory Visit: Payer: Self-pay | Admitting: Family Medicine

## 2012-06-30 ENCOUNTER — Other Ambulatory Visit: Payer: Self-pay | Admitting: *Deleted

## 2012-06-30 MED ORDER — TIOTROPIUM BROMIDE MONOHYDRATE 18 MCG IN CAPS: 18.0000 ug | ORAL_CAPSULE | Freq: Every day | RESPIRATORY_TRACT | Status: DC

## 2012-07-05 ENCOUNTER — Ambulatory Visit (INDEPENDENT_AMBULATORY_CARE_PROVIDER_SITE_OTHER): Payer: PRIVATE HEALTH INSURANCE | Admitting: Pulmonary Disease

## 2012-07-05 ENCOUNTER — Ambulatory Visit: Payer: Self-pay | Admitting: Pulmonary Disease

## 2012-07-05 ENCOUNTER — Telehealth: Payer: Self-pay | Admitting: Pulmonary Disease

## 2012-07-05 ENCOUNTER — Encounter: Payer: Self-pay | Admitting: Pulmonary Disease

## 2012-07-05 VITALS — BP 120/70 | HR 106 | Temp 97.8°F | Ht 63.0 in | Wt 237.0 lb

## 2012-07-05 DIAGNOSIS — J449 Chronic obstructive pulmonary disease, unspecified: Secondary | ICD-10-CM

## 2012-07-05 DIAGNOSIS — E118 Type 2 diabetes mellitus with unspecified complications: Secondary | ICD-10-CM

## 2012-07-05 DIAGNOSIS — J441 Chronic obstructive pulmonary disease with (acute) exacerbation: Secondary | ICD-10-CM

## 2012-07-05 MED ORDER — PREDNISONE 20 MG PO TABS: 20.0000 mg | ORAL_TABLET | Freq: Every day | ORAL | Status: AC

## 2012-07-05 MED ORDER — FLUCONAZOLE 200 MG PO TABS: 200.0000 mg | ORAL_TABLET | Freq: Every day | ORAL | Status: DC

## 2012-07-05 MED ORDER — INSULIN ASPART 100 UNIT/ML ~~LOC~~ SOLN: SUBCUTANEOUS | Status: DC

## 2012-07-05 MED ORDER — LEVOFLOXACIN 500 MG PO TABS: 500.0000 mg | ORAL_TABLET | Freq: Every day | ORAL | Status: AC

## 2012-07-05 MED ORDER — FLUCONAZOLE 100 MG PO TABS: 100.0000 mg | ORAL_TABLET | Freq: Every day | ORAL | Status: AC

## 2012-07-05 NOTE — Assessment & Plan Note (Addendum)
Unfortunately Catherine [name] continues to struggle with her COPD. She has very severe disease and unfortunately has had multiple exacerbations in the last year. I think all the exacerbations are a marker of her disease severity as I struggle to find an alternative cause such as an atypical infection or allergy. 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  Today we discussed possible comorbid illnesses that can contribute to COPD such as allergic bronchopulmonary aspergillosis or some underlying allergy from perhaps a mold in her house. She says that she has not think she has mold that she will have her house checked. I don't think she has ABPA she does not produce sputum on a regular basis.  Plan: -Continue oxygen as written -See exacerbation below -Continue Roflumilast -Continue Symbicort -Continue Spiriva -She is going to have her house checked for mold and mildew.  -We will see her back next month

## 2012-07-05 NOTE — Patient Instructions (Signed)
Take the prednisone and Levaquin as prescribed  If you are able to produce phlegm or sputum in the cup we have given you please bring it here for testing  Call us if you are not improving  Keep taking your other medications as written  We will see you back in November

## 2012-07-05 NOTE — Telephone Encounter (Signed)
Pharmacy wants to know did you want pt to take a 200mg  and do a  30 days rx  of diflucan? Pt told pharmacy she usually gets Diflucan 150mg  after an abx. Dr Kendrick Fries please advise. Thank you

## 2012-07-05 NOTE — Progress Notes (Signed)
Subjective:    Patient ID: Catherine [name], female    DOB: 07-Mar-1956, 56 y.o.   MRN: [id number]  Synopsis: Catherine [name] first saw the [facility] in June 2013 for GOLD Grade D COPD.  She has frequent exacerbations, poor functional status, and her FEV1 is 0.91 L (41% pred).  She smoked 1 ppd for 40 years and quit in 2012.  HPI  04/19/12 ROV -- since her last visit Catherine [name] was admitted to [facility] for shortness of breath. I do not have record of this yet but apparently she ruled out for an MI and was treated with steroids. Since her hospital discharge she says that she has had no improvement in her shortness of breath. She continues to have cough without sputum production and she notes chest congestion and chest tightness. She has not had leg swelling. She does not have fever or chills but she has noted frequent sweats. She continues to use oxygen at 2 L continuously which was started during this hospitalization and she is still on a prednisone taper (currently taking 20 mg daily).  05/03/12 ROV --Since her last visit Catherine [name] says that she initially improved with the moxifloxacin therapy that we wrote. However she is still having a lot of trouble with cough and her coughing spells are causing significant shortness of breath and wheezing. She states that when she is at rest she does not wheeze or have shortness of breath. However when she starts coughing her shortness of breath becomes quite significant. She has noted more sinus congestion and sneezing which she thinks may be due to allergies. She notes some indigestion at home and takes protonix for acid reflux. She has not had fevers or chills since her hospitalization. She has chest tightness and some body cramps but has not had significant swelling in her feet.  07/05/2012 ROV --Catherine [name] returns to clinic today stating that she feels lousy. She says that for the last week she has had increasing chest congestion without fevers  or chills. She has had sweats though. She states that she's had increasing shortness of breath in the last 2-3 days and almost went to the emergency department last night. She denies significant sinus symptoms but she states that her coughing spells are lengthy. She feels chest congestion but cannot produce sputum. She has not been well enough to get a flu shot yet this year.    Past Medical History  Diagnosis Date  . Diabetes mellitus   . Leukocytosis   . Hyperlipidemia   . COPD (chronic obstructive pulmonary disease)   . Hyperglycemia   . Hypertension   . S/P cardiac catheterization March 2012    normal coronaries,  due to chest pain ([place])  . Tobacco abuse, in remission     quit oct during hospitalization   . Screening for colon cancer June 2011    polyps found, next one due 2014 ([facility])  . Gastritis and duodenitis June 2011    EGD  . Screening for breast cancer Jul 28 2011    normal  . GERD (gastroesophageal reflux disease)   . Obesity (BMI 30-39.9)   . Depression   . Anxiety     Review of Systems  Constitutional: Negative for fever, chills and unexpected weight change.  HENT: Negative for nosebleeds, congestion, rhinorrhea, sneezing and postnasal drip.   Respiratory: Positive for cough and shortness of breath. Negative for choking.   Cardiovascular: Negative for chest pain and leg swelling.  Objective:   Physical Exam   Filed Vitals:   07/05/12 1333  BP: 120/70  Pulse: 106  Temp: 97.8 F (36.6 C)  TempSrc: Oral  Height: 5\' 3"  (1.6 m)  Weight: 237 lb (107.502 kg)  SpO2: 95%   Gen: obese, speaking in full sentences HEENT: NCAT, PERRL, EOMi, OP clear, neck supple without masses PULM: CTA B, no wheezing CV: RRR, no mgr, no JVD AB: BS+, soft, nontender, no hsm Ext: warm, no edema, no clubbing, no cyanosis   09/2011 and 12/2011 CXR from [facility] reviewed: no clear parenchymal abnormality, some vascular congestion  04/2012 pulmonary function test ratio 50%,  FEV1 0.92 L (41%), TLC 3.39 L (72% predicted) ERV (15% predicted), DLCO 51% predicted  04/2012 CT Chest: no PE, no infiltrate, no evidence of fibrosis     Assessment & Plan:   COPD (chronic obstructive pulmonary disease) Unfortunately Catherine [name] continues to struggle with her COPD. She has very severe disease and unfortunately has had multiple exacerbations in the last year. I think all the exacerbations are a marker of her disease severity as I struggle to find an alternative cause such as an atypical infection or allergy. All of her chest imaging has revealed only emphysema without evidence of fibrosis (including a CT chest performed at [place] several months ago).  Today we discussed possible comorbid illnesses that can contribute to COPD such as allergic bronchopulmonary aspergillosis or some underlying allergy from perhaps a mold in her house. She says that she has not think she has mold that she will have her house checked. I don't think she has ABPA she does not produce sputum on a regular basis.  Plan: -Continue oxygen as written -See exacerbation below -Continue Roflumilast -Continue Symbicort -Continue Spiriva -She is going to have her house checked for mold and mildew.  -We will see her back next month  COPD exacerbation Unfortunately Catherine [name] has had a node or exacerbation of her COPD. See discussion above  Plan: -Prednisone 20 mg daily for 5 days (she is not wheezing very much right now and she has significant hyperglycemia on prednisone some doing to minimize the dose) -Levaquin for 7 days -Supplemental insulin written for hyperglycemia while on prednisone -She is to provide [place] with a sputum specimen if she starts producing sputum.  Diabetes mellitus with complication Written for supplemental insulin to be used for hyperglycemia while she is on the course of prednisone. She is very familiar with its use on a sliding scale basis. I advised her not to use it any more  often than every 4-6 hours.    Updated Medication List Outpatient Encounter Prescriptions as of 07/05/2012  Medication Sig Dispense Refill  . albuterol (PROVENTIL HFA) 108 (90 BASE) MCG/ACT inhaler Inhale 2 puffs into the lungs every 6 (six) hours as needed for wheezing.  2 Inhaler  3  . ALPRAZolam (XANAX) 0.25 MG tablet Take 1 tablet (0.25 mg total) by mouth at bedtime as needed.  30 tablet  2  . aspirin 81 MG EC tablet Take 81 mg by mouth daily.        Marland Kitchen atorvastatin (LIPITOR) 80 MG tablet Take 1 tablet (80 mg total) by mouth daily.  30 tablet  11  . budesonide-formoterol (SYMBICORT) 160-4.5 MCG/ACT inhaler Inhale 2 puffs into the lungs 2 (two) times daily.  10.2 g  6  . chlorpheniramine-HYDROcodone (TUSSIONEX) 10-8 MG/5ML LQCR Take 5 mLs by mouth every 12 (twelve) hours as needed.  180 mL  0  . Ciclesonide (  ZETONNA) 37 MCG/ACT AERS Place 1 Squirt into the nose 2 (two) times daily.  4.7 g  6  . docusate sodium (COLACE) 100 MG capsule Take 200 mg by mouth daily.      Marland Kitchen glipiZIDE (GLUCOTROL) 5 MG tablet Take 1 tablet (5 mg total) by mouth 2 (two) times daily.  60 tablet  11  . glucose blood (ACCU-CHEK SMARTVIEW) test strip Use as instructed  150 each  11  . guaiFENesin (MUCINEX) 600 MG 12 hr tablet Take 600 mg by mouth daily.      . insulin glargine (LANTUS) 100 UNIT/ML injection Inject 20 Units into the skin daily.  10 mL  5  . Insulin Syringe-Needle U-100 (INSULIN SYRINGE .5CC/31GX5/16") 31G X 5/16" 0.5 ML MISC As directed      . ipratropium-albuterol (DUONEB) 0.5-2.5 (3) MG/3ML SOLN Take 3 mLs by nebulization every 4 (four) hours as needed.      . Levofloxacin (LEVAQUIN PO) Take by mouth.      . pantoprazole (PROTONIX) 40 MG tablet Take 1 tablet (40 mg total) by mouth 2 (two) times daily.  60 tablet  2  . Potassium Gluconate 595 MG CAPS Take 1 capsule by mouth daily.      . predniSONE (DELTASONE) 10 MG tablet 6 tablets twice daily for 3 days,  Then once daily for 3 days ,then taper by 10  mg daily until gone  60 tablet  0  . predniSONE (STERAPRED UNI-PAK) 10 MG tablet Take 10 mg by mouth daily.      . promethazine-phenylephrine (PROMETHAZINE-PHENYLEPHRINE) 6.25-5 MG/5ML SYRP Take 5 mLs by mouth every 4 (four) hours as needed.  240 mL  0  . roflumilast (DALIRESP) 500 MCG TABS tablet Take 1 tablet (500 mcg total) by mouth every other day.  15 tablet  5  . tiotropium (SPIRIVA) 18 MCG inhalation capsule Place 1 capsule (18 mcg total) into inhaler and inhale daily.  30 capsule  6  . traMADol (ULTRAM) 50 MG tablet Take 1 tablet (50 mg total) by mouth every 6 (six) hours as needed for pain (or cough).  120 tablet  1

## 2012-07-05 NOTE — Assessment & Plan Note (Signed)
Written for supplemental insulin to be used for hyperglycemia while she is on the course of prednisone. She is very familiar with its use on a sliding scale basis. I advised her not to use it any more often than every 4-6 hours.

## 2012-07-05 NOTE — Telephone Encounter (Signed)
Please let them know only one day.  Thanks

## 2012-07-05 NOTE — Assessment & Plan Note (Signed)
Unfortunately Catherine [name] has had a node or exacerbation of her COPD. See discussion above  Plan: -Prednisone 20 mg daily for 5 days (she is not wheezing very much right now and she has significant hyperglycemia on prednisone some doing to minimize the dose) -Levaquin for 7 days -Supplemental insulin written for hyperglycemia while on prednisone -She is to provide [place] with a sputum specimen if she starts producing sputum.

## 2012-07-06 ENCOUNTER — Encounter: Payer: Self-pay | Admitting: Pulmonary Disease

## 2012-07-06 NOTE — Telephone Encounter (Signed)
Per Harrold Donath, he spoke with Dr. Lorrin Goodell last night and this has been taken care of and nothing further is needed.

## 2012-07-07 ENCOUNTER — Telehealth: Payer: Self-pay | Admitting: *Deleted

## 2012-07-07 NOTE — Telephone Encounter (Signed)
Spoke with pt and notified cxr looks okay per Dr. Kendrick Fries. Pt verbalized understanding and states no questions/concerns.

## 2012-07-07 NOTE — Telephone Encounter (Signed)
Message copied by Christen Butter on Wed Jul 07, 2012  2:13 PM ------      Message from: Max Fickle B      Created: Wed Jul 07, 2012  1:50 PM       L,            Please let her know her CXR was OK            B

## 2012-07-09 ENCOUNTER — Emergency Department: Payer: Self-pay | Admitting: Emergency Medicine

## 2012-07-09 ENCOUNTER — Encounter: Payer: Self-pay | Admitting: Pulmonary Disease

## 2012-07-09 ENCOUNTER — Telehealth: Payer: Self-pay | Admitting: Pulmonary Disease

## 2012-07-09 LAB — COMPREHENSIVE METABOLIC PANEL
Anion Gap: 7 (ref 7–16)
Bilirubin,Total: 0.3 mg/dL (ref 0.2–1.0)
Calcium, Total: 8.8 mg/dL (ref 8.5–10.1)
Chloride: 103 mmol/L (ref 98–107)
Co2: 28 mmol/L (ref 21–32)
Creatinine: 0.81 mg/dL (ref 0.60–1.30)
EGFR (African American): >60
Osmolality: 283 (ref 275–301)
Potassium: 4.1 mmol/L (ref 3.5–5.1)
Sodium: 138 mmol/L (ref 136–145)

## 2012-07-09 LAB — CBC
HCT: 39.3 % (ref 35.0–47.0)
HGB: 13.5 g/dL (ref 12.0–16.0)
MCHC: 34.3 g/dL (ref 32.0–36.0)
MCV: 88 fL (ref 80–100)
Platelet: 319 10*3/uL (ref 150–440)
RDW: 13.8 % (ref 11.5–14.5)

## 2012-07-09 LAB — PRO B NATRIURETIC PEPTIDE: B-Type Natriuretic Peptide: 27 pg/mL (ref 0–125)

## 2012-07-09 NOTE — Telephone Encounter (Signed)
Called spoke with patient who stated that her symptoms from 10.28.13 ov are completely unchanged w/ continued SOB, difficulty breathing, wheezing and dry cough.  Pt stated she is taking the prednisone and levaquin as directed...  Patient Instructions     Take the prednisone and Levaquin as prescribed  If you are able to produce phlegm or sputum in the cup we have given you please bring it here for testing  Call us if you are not improving  Keep taking your other medications as written  We will see you back in November    Dr Kendrick Fries please advise, thanks.

## 2012-07-09 NOTE — Telephone Encounter (Signed)
Catherine [name] called the office very short of breath.  She has not improved this week with antibiotics and prednisone.  She is home with her 58 year old son.  I advised her to seek emergency care immediately.  She said her son will taker her to the emergency room.

## 2012-07-12 ENCOUNTER — Ambulatory Visit (INDEPENDENT_AMBULATORY_CARE_PROVIDER_SITE_OTHER): Payer: PRIVATE HEALTH INSURANCE | Admitting: Internal Medicine

## 2012-07-12 ENCOUNTER — Encounter: Payer: Self-pay | Admitting: Internal Medicine

## 2012-07-12 ENCOUNTER — Telehealth: Payer: Self-pay | Admitting: Internal Medicine

## 2012-07-12 VITALS — BP 157/88 | HR 118 | Temp 98.2°F | Resp 18 | Ht 63.0 in | Wt 238.5 lb

## 2012-07-12 DIAGNOSIS — J441 Chronic obstructive pulmonary disease with (acute) exacerbation: Secondary | ICD-10-CM

## 2012-07-12 MED ORDER — HYDROCOD POLST-CHLORPHEN POLST 10-8 MG/5ML PO LQCR: 5.0000 mL | Freq: Three times a day (TID) | ORAL | Status: DC | PRN

## 2012-07-12 MED ORDER — PREDNISONE (PAK) 10 MG PO TABS: ORAL_TABLET | ORAL | Status: DC

## 2012-07-12 MED ORDER — HYDROCOD POLST-CHLORPHEN POLST 10-8 MG/5ML PO LQCR: 5.0000 mL | Freq: Two times a day (BID) | ORAL | Status: DC | PRN

## 2012-07-12 NOTE — Progress Notes (Signed)
Patient ID: [name], female   DOB: 1955/09/20, 56 y.o.   MRN: [id number]  Patient Active Problem List  Diagnosis  . CAD (coronary artery disease)  . HTN (hypertension)  . Hyperlipidemia  . Tobacco abuse, in remission  . Screening for colon cancer  . Gastritis and duodenitis  . Screening for breast cancer  . GERD (gastroesophageal reflux disease)  . Obesity (BMI 30-39.9)  . Depression  . Anxiety  . Diabetes mellitus with complication  . COPD (chronic obstructive pulmonary disease)  . Cough  . Chronic hypoxemic respiratory failure  . COPD exacerbation    Subjective:  CC:   Chief Complaint  Patient presents with  . COPD    HPI:   Catherine Rollison Whiteheadis a 56 y.o. female who presents with Persistent dyspnea and cough. Patient was last evaluated by the Dr. Kendrick Fries on October 28 for COPD exact sedation since then she has had one trip to the ER and was given a prolonged albuterol nebulizer treatment for 30 minutes and her prednisone dose was increased to 50 mg daily for 4 days. Chest x-ray done then was negative for infiltrates and pulmonary vascular congestion  as it was when Dr. Kendrick Fries saw her. .  Today is day 2 of the 50 mg dose of prednisone. She states that she feels no better. Her oxygen level is maintaining above 90% on 2 L but if she takes her oxygen off to take a shower her level drops to 83. Her heart rate jumps into the 140s and 150s if she annularis without oxygen she is practicing pursed lip breathing which she states does help with her symptoms but this is difficult to do when she is coughing so much. She is using the Tussionex every 12 hours and finds that it is wearing off early and not controlling her cough. She feels like crap.   Past Medical History  Diagnosis Date  . Diabetes mellitus   . Leukocytosis   . Hyperlipidemia   . COPD (chronic obstructive pulmonary disease)   . Hyperglycemia   . Hypertension   . S/P cardiac catheterization March 2012   normal coronaries,  due to chest pain ([facility])  . Tobacco abuse, in remission     quit oct during hospitalization   . Screening for colon cancer June 2011    polyps found, next one due 2014 ([facility])  . Gastritis and duodenitis June 2011    EGD  . Screening for breast cancer Jul 28 2011    normal  . GERD (gastroesophageal reflux disease)   . Obesity (BMI 30-39.9)   . Depression   . Anxiety     Past Surgical History  Procedure Date  . Cardiac catheterization 11/22/2010    no significant disease  . Abdominal hysterectomy   . Hemorrhoid surgery   . Abdominal hysterectomy   . Bilateral oophorectomy 1995    and TAH.  noncancerous reasons         The following portions of the patient's history were reviewed and updated as appropriate: Allergies, current medications, and problem list.    Review of Systems:   12 Pt  review of systems was negative except those addressed in the HPI,     History   Social History  . Marital Status: Married    Spouse Name: N/A    Number of Children: 4  . Years of Education: N/A   Occupational History  . Secretary    Social History Main Topics  . Smoking status:  Former Smoker -- 1.0 packs/day for 40 years    Types: Cigarettes    Quit date: 06/27/2011  . Smokeless tobacco: Never Used  . Alcohol Use: No  . Drug Use: No  . Sexually Active: Not on file   Other Topics Concern  . Not on file   Social History Narrative  . No narrative on file    Objective:  BP 157/88  Pulse 118  Temp 98.2 F (36.8 C) (Oral)  Resp 18  Ht 5\' 3"  (1.6 m)  Wt 238 lb 8 oz (108.183 kg)  BMI 42.25 kg/m2  SpO2 92%  General appearance: alert, cooperative and appears stated age mild respiratory distress. Ears: normal TM's and external ear canals both ears Throat: lips, mucosa, and tongue normal; teeth and gums normal Neck: no adenopathy, no carotid bruit, supple, symmetrical, trachea midline and thyroid not enlarged, symmetric, no  tenderness/mass/nodules Back: symmetric, no curvature. ROM normal. No CVA tenderness. Lungs: Decreased breath sounds bilaterally. No audible wheezing. Heart: regular rate and rhythm, S1, S2 normal, no murmur, click, rub or gallop Abdomen: soft, non-tender; bowel sounds normal; no masses,  no organomegaly Pulses: 2+ and symmetric Skin: Skin color, texture, turgor normal. No rashes or lesions Lymph nodes: Cervical, supraclavicular, and axillary nodes normal.  Assessment and Plan:  COPD exacerbation Persistent. At rest her her respiratory rate is normal. She is able to practice purse lip breathing and maintain her sats if she is resting. I've increased her prednisone to 60 mg twice daily for 3 days followed by 60 mg daily and decrease by 10 mg every 3 days. I've increased her Tussionex dose as well to every 8 hours to provide better control of her cough.She has been instructed that if she decompensates she needs to go to the ER to be admitted. She's becoming steroid dependent. Case discussed with her pulmonologist. She is not a candidate for lung resection due to her coronary artery disease. She will follow up in one week.    Updated Medication List Outpatient Encounter Prescriptions as of 07/12/2012  Medication Sig Dispense Refill  . predniSONE (DELTASONE) 50 MG tablet 50 mg. Take for four days      . albuterol (PROVENTIL HFA) 108 (90 BASE) MCG/ACT inhaler Inhale 2 puffs into the lungs every 6 (six) hours as needed for wheezing.  2 Inhaler  3  . ALPRAZolam (XANAX) 0.25 MG tablet Take 1 tablet (0.25 mg total) by mouth at bedtime as needed.  30 tablet  2  . aspirin 81 MG EC tablet Take 81 mg by mouth daily.        Marland Kitchen atorvastatin (LIPITOR) 80 MG tablet Take 1 tablet (80 mg total) by mouth daily.  30 tablet  11  . budesonide-formoterol (SYMBICORT) 160-4.5 MCG/ACT inhaler Inhale 2 puffs into the lungs 2 (two) times daily.  10.2 g  6  . chlorpheniramine-HYDROcodone (TUSSIONEX) 10-8 MG/5ML LQCR Take 5  mLs by mouth every 8 (eight) hours as needed.  240 mL  0  . Ciclesonide (ZETONNA) 37 MCG/ACT AERS Place 1 Squirt into the nose 2 (two) times daily.  4.7 g  6  . docusate sodium (COLACE) 100 MG capsule Take 200 mg by mouth daily.      . fluconazole (DIFLUCAN) 100 MG tablet Take 1 tablet (100 mg total) by mouth daily.  30 tablet  0  . fluconazole (DIFLUCAN) 200 MG tablet Take 1 tablet (200 mg total) by mouth daily.  1 tablet  0  . glipiZIDE (GLUCOTROL) 5 MG  tablet Take 1 tablet (5 mg total) by mouth 2 (two) times daily.  60 tablet  11  . glucose blood (ACCU-CHEK SMARTVIEW) test strip Use as instructed  150 each  11  . insulin aspart (NOVOLOG) 100 UNIT/ML injection Use per home sliding scale while on prednisone  10 mL  3  . insulin glargine (LANTUS) 100 UNIT/ML injection Inject 20 Units into the skin daily.  10 mL  5  . Insulin Syringe-Needle U-100 (INSULIN SYRINGE .5CC/31GX5/16") 31G X 5/16" 0.5 ML MISC As directed      . ipratropium-albuterol (DUONEB) 0.5-2.5 (3) MG/3ML SOLN Take 3 mLs by nebulization every 4 (four) hours as needed.      . [EXPIRED] levofloxacin (LEVAQUIN) 500 MG tablet Take 1 tablet (500 mg total) by mouth daily.  7 tablet  0  . pantoprazole (PROTONIX) 40 MG tablet Take 1 tablet (40 mg total) by mouth 2 (two) times daily.  60 tablet  2  . Potassium Gluconate 595 MG CAPS Take 1 capsule by mouth daily.      . predniSONE (STERAPRED UNI-PAK) 10 MG tablet 60 mg twice daily for 3 days,  Then once daily for 3 days, then decrease by 10 mg every 3 days   Until  Gone (21 day taper)  100 tablet  0  . promethazine-phenylephrine (PROMETHAZINE-PHENYLEPHRINE) 6.25-5 MG/5ML SYRP Take 5 mLs by mouth every 4 (four) hours as needed.  240 mL  0  . roflumilast (DALIRESP) 500 MCG TABS tablet Take 1 tablet (500 mcg total) by mouth every other day.  15 tablet  5  . tiotropium (SPIRIVA) 18 MCG inhalation capsule Place 1 capsule (18 mcg total) into inhaler and inhale daily.  30 capsule  6  . traMADol  (ULTRAM) 50 MG tablet Take 1 tablet (50 mg total) by mouth every 6 (six) hours as needed for pain (or cough).  120 tablet  1  . [DISCONTINUED] chlorpheniramine-HYDROcodone (TUSSIONEX) 10-8 MG/5ML LQCR Take 5 mLs by mouth every 12 (twelve) hours as needed.  180 mL  0  . [DISCONTINUED] chlorpheniramine-HYDROcodone (TUSSIONEX) 10-8 MG/5ML LQCR Take 5 mLs by mouth every 12 (twelve) hours as needed.  180 mL  0     No orders of the defined types were placed in this encounter.    No Follow-up on file.

## 2012-07-12 NOTE — Patient Instructions (Addendum)
Increase your prednisone to 60 mg twice daily for 3 days,  Then once daily for 3 days  Decrease by 10 mg every 3 days   You may increase the tussionex to every 8 hours if you are alert  And coughing at 8 hours

## 2012-07-12 NOTE — Telephone Encounter (Signed)
Caller: Cady/Patient; Patient Name: Catherine [name]; PCP: Duncan Dull (Adults only); Best Callback Phone Number: [phone] Taken by ambulance to [facility] on 07/09/12 for exacerbation of COPD. Received nebs and Prednisone and discharged home w/ need to follow up in office. Continues to feel SOB though able to talk in complete sentence without gasping; frequent wet, non-productive cough. Afebrile.  BS ranging up to 262 and using Novolog to control. Emergent sx of 'New or worsening cough adn known cardiac or respiratory condition' positive per 'Cough - Adult' protocol. Appt made for 1545 w/ Dr. Darrick Huntsman on 07/12/12.

## 2012-07-14 ENCOUNTER — Encounter: Payer: Self-pay | Admitting: Internal Medicine

## 2012-07-14 NOTE — Assessment & Plan Note (Signed)
Persistent. At rest her her respiratory rate is normal. She is able to practice purse lip breathing and maintain her sats if she is resting. I've increased her prednisone to 60 mg twice daily for 3 days followed by 60 mg daily and decrease by 10 mg every 3 days. I've increased her Tussionex dose as well to every 8 hours to provide better control of her cough.She has been instructed that if she decompensates she needs to go to the ER to be admitted. She's becoming steroid dependent. Case discussed with her pulmonologist. She is not a candidate for lung resection due to her coronary artery disease. She will follow up in one week.

## 2012-07-22 ENCOUNTER — Observation Stay: Payer: Self-pay | Admitting: Specialist

## 2012-07-22 LAB — COMPREHENSIVE METABOLIC PANEL
Albumin: 2.9 g/dL — ABNORMAL LOW (ref 3.4–5.0)
Alkaline Phosphatase: 119 U/L (ref 50–136)
Anion Gap: 7 (ref 7–16)
BUN: 14 mg/dL (ref 7–18)
Calcium, Total: 8.5 mg/dL (ref 8.5–10.1)
Co2: 28 mmol/L (ref 21–32)
EGFR (Non-African Amer.): >60
Glucose: 229 mg/dL — ABNORMAL HIGH (ref 65–99)
Osmolality: 283 (ref 275–301)
Potassium: 4.4 mmol/L (ref 3.5–5.1)
SGOT(AST): 19 U/L (ref 15–37)
SGPT (ALT): 32 U/L (ref 12–78)
Sodium: 138 mmol/L (ref 136–145)

## 2012-07-22 LAB — CBC
MCH: 30.4 pg (ref 26.0–34.0)
MCHC: 34.1 g/dL (ref 32.0–36.0)
MCV: 89 fL (ref 80–100)
Platelet: 222 10*3/uL (ref 150–440)
RBC: 4.29 10*6/uL (ref 3.80–5.20)
RDW: 13.9 % (ref 11.5–14.5)

## 2012-07-22 LAB — CK TOTAL AND CKMB (NOT AT ARMC)
CK, Total: 254 U/L — ABNORMAL HIGH (ref 21–215)
CK, Total: 210 U/L (ref 21–215)

## 2012-07-22 LAB — PROTIME-INR: Prothrombin Time: 12.2 secs (ref 11.5–14.7)

## 2012-07-22 LAB — TROPONIN I: Troponin-I: <0.02 ng/mL

## 2012-07-22 LAB — APTT: Activated PTT: <23 secs — ABNORMAL LOW (ref 23.6–35.9)

## 2012-07-23 LAB — CBC WITH DIFFERENTIAL/PLATELET
Basophil #: 0 10*3/uL (ref 0.0–0.1)
Lymphocyte #: 1.6 10*3/uL (ref 1.0–3.6)
MCH: 30.1 pg (ref 26.0–34.0)
MCV: 89 fL (ref 80–100)
Monocyte #: 0.8 x10 3/mm (ref 0.2–0.9)
Monocyte %: 4.1 %
Platelet: 213 10*3/uL (ref 150–440)
RDW: 14.1 % (ref 11.5–14.5)
WBC: 18.7 10*3/uL — ABNORMAL HIGH (ref 3.6–11.0)

## 2012-07-23 LAB — CK TOTAL AND CKMB (NOT AT ARMC)
CK, Total: 209 U/L (ref 21–215)
CK-MB: 3.9 ng/mL — ABNORMAL HIGH (ref 0.5–3.6)

## 2012-08-03 ENCOUNTER — Encounter: Payer: Self-pay | Admitting: Internal Medicine

## 2012-08-03 ENCOUNTER — Other Ambulatory Visit: Payer: Self-pay

## 2012-08-03 ENCOUNTER — Ambulatory Visit (INDEPENDENT_AMBULATORY_CARE_PROVIDER_SITE_OTHER): Payer: PRIVATE HEALTH INSURANCE | Admitting: Internal Medicine

## 2012-08-03 VITALS — BP 120/62 | HR 122 | Temp 98.2°F | Resp 12 | Ht 63.0 in | Wt 236.2 lb

## 2012-08-03 DIAGNOSIS — J449 Chronic obstructive pulmonary disease, unspecified: Secondary | ICD-10-CM

## 2012-08-03 DIAGNOSIS — J438 Other emphysema: Secondary | ICD-10-CM

## 2012-08-03 DIAGNOSIS — E118 Type 2 diabetes mellitus with unspecified complications: Secondary | ICD-10-CM

## 2012-08-03 DIAGNOSIS — J441 Chronic obstructive pulmonary disease with (acute) exacerbation: Secondary | ICD-10-CM

## 2012-08-03 DIAGNOSIS — J4489 Other specified chronic obstructive pulmonary disease: Secondary | ICD-10-CM

## 2012-08-03 DIAGNOSIS — E119 Type 2 diabetes mellitus without complications: Secondary | ICD-10-CM

## 2012-08-03 DIAGNOSIS — J439 Emphysema, unspecified: Secondary | ICD-10-CM

## 2012-08-03 LAB — LDL CHOLESTEROL, DIRECT: Direct LDL: 104.9 mg/dL

## 2012-08-03 LAB — BASIC METABOLIC PANEL
BUN: 9 mg/dL (ref 6–23)
Chloride: 105 mEq/L (ref 96–112)
Potassium: 3.6 mEq/L (ref 3.5–5.1)
Sodium: 139 mEq/L (ref 135–145)

## 2012-08-03 LAB — HEMOGLOBIN A1C: Hgb A1c MFr Bld: 7.3 % — ABNORMAL HIGH (ref 4.6–6.5)

## 2012-08-03 MED ORDER — GLUCOSE BLOOD VI STRP: ORAL_STRIP | Status: DC

## 2012-08-03 MED ORDER — PANTOPRAZOLE SODIUM 40 MG PO TBEC: 40.0000 mg | DELAYED_RELEASE_TABLET | Freq: Two times a day (BID) | ORAL | Status: DC

## 2012-08-03 MED ORDER — PREDNISONE 20 MG PO TABS: 60.0000 mg | ORAL_TABLET | Freq: Every day | ORAL | Status: DC

## 2012-08-03 MED ORDER — "INSULIN SYRINGE 31G X 5/16"" 0.5 ML MISC": Status: DC

## 2012-08-03 MED ORDER — INSULIN NPH ISOPHANE & REGULAR (70-30) 100 UNIT/ML ~~LOC~~ SUSP: 20.0000 [IU] | Freq: Every day | SUBCUTANEOUS | Status: DC

## 2012-08-03 MED ORDER — GLIPIZIDE 5 MG PO TABS: 5.0000 mg | ORAL_TABLET | Freq: Two times a day (BID) | ORAL | Status: DC

## 2012-08-03 NOTE — Assessment & Plan Note (Addendum)
With frequent , almost monthly admissions to [facility] for exacerbations. Chest x-rays have been repeatedly free of infiltrate. Most recent overnight admission nov 14th for chest pain.  She states that her breathing is now at baseline.   her frequent decompensations are troubling and of uncertain etiology. She has had pulmonary evaluation by Dr. Kendrick Fries including pulmonary function tests and 2 CTs of the chest this year (last one August ) done at [facility] and showed multiple subcentimeter pulmonary nodules, some calcified, lingular ATX vs fibrosis and no evidence of interstitial lung disease or apical blebs that would make her a candidate for lung resection She is on maximal management .  I am sending her to [facility] for a high-resolution CT at [facility] to make sure a different set of eyes might view the lung parenchyma differently and see interstitial lung disease. I've also advised her she has another decompensation that does not require EMS transport that she should take herself to the [facility] ER so she can be admitted there and evaluated by Dr. Kendrick Fries and his partners at [facility] pulmonology . I have sent a prescription for Prednisone to [facility] in the event that she has another decompensation over the holiday.

## 2012-08-03 NOTE — Progress Notes (Signed)
Patient ID: Catherine [name], female   DOB: Feb 27, 1956, 56 y.o.   MRN: [id number]  Patient Active Problem List  Diagnosis  . CAD (coronary artery disease)  . HTN (hypertension)  . Hyperlipidemia  . Tobacco abuse, in remission  . Screening for colon cancer  . Gastritis and duodenitis  . Screening for breast cancer  . GERD (gastroesophageal reflux disease)  . Obesity (BMI 30-39.9)  . Depression  . Anxiety  . Diabetes mellitus with complication  . COPD (chronic obstructive pulmonary disease)  . Cough  . Chronic hypoxemic respiratory failure  . COPD exacerbation    Subjective:  CC:   Chief Complaint  Patient presents with  . Follow-up    HPI:   Catherine [name] a 56 y.o. female who presents for hospital follow up from overnight admission to [facility] Nov 14th for chest pain and dyspnea secondary to COPD exacerbation failing outpatient treatment with steroids and antibiotics. She ruled out for acute MI. No further workup for chest pain was done other than a chest x-ray since she had a cardiac cath last year which showed minimal disease. She was discharged home on a steroid taper.  She states that her dyspnea resolved on or around November 16. She is not wearing supplemental O2 today and states that her home resting sats have been above 92%. She continues to be short of breath with ambulation and developed tachycardia with minimal exertion. She notes that she has started coughing he the last 24 hours and has noticed some bilateral cervical lymphadenopathy. She is taking amoxicillin for an anticipated dental procedure but has not had any fevers and is not short of breath.   2) DM:  She has been checking her sugars 4 times daily since discharge. Initially her sugars were elevated due to the steroids but the last 24 hours they have been under reasonable control: 105 fasting, 120 at lunch and 154 at supper.    Past Medical History  Diagnosis Date  . Diabetes mellitus   . Leukocytosis    . Hyperlipidemia   . COPD (chronic obstructive pulmonary disease)   . Hyperglycemia   . Hypertension   . S/P cardiac catheterization March 2012    normal coronaries,  due to chest pain ([facility])  . Tobacco abuse, in remission     quit oct during hospitalization   . Screening for colon cancer June 2011    polyps found, next one due 2014 ([facility])  . Gastritis and duodenitis June 2011    EGD  . Screening for breast cancer Jul 28 2011    normal  . GERD (gastroesophageal reflux disease)   . Obesity (BMI 30-39.9)   . Depression   . Anxiety     Past Surgical History  Procedure Date  . Cardiac catheterization 11/22/2010    no significant disease  . Abdominal hysterectomy   . Hemorrhoid surgery   . Abdominal hysterectomy   . Bilateral oophorectomy 1995    and TAH.  noncancerous reasons    The following portions of the patient's history were reviewed and updated as appropriate: Allergies, current medications, and problem list.   Review of Systems:   Patient denies headache, fevers, malaise, unintentional weight loss, skin rash, eye pain, sinus congestion and sinus pain, sore throat, dysphagia,  hemoptysis , dyspnea, wheezing, chest pain, palpitations, orthopnea, edema, abdominal pain, nausea, melena, diarrhea, constipation, flank pain, dysuria, hematuria, urinary  Frequency, nocturia, numbness, tingling, seizures,  Focal weakness, Loss of consciousness,  Tremor, insomnia, depression, anxiety,  and suicidal ideation.     History   Social History  . Marital Status: Married    Spouse Name: N/A    Number of Children: 4  . Years of Education: N/A   Occupational History  . Secretary    Social History Main Topics  . Smoking status: Former Smoker -- 1.0 packs/day for 40 years    Types: Cigarettes    Quit date: 06/27/2011  . Smokeless tobacco: Never Used  . Alcohol Use: No  . Drug Use: No  . Sexually Active: Not on file   Other Topics Concern  . Not on file   Social  History Narrative  . No narrative on file    Objective:  BP 120/62  Pulse 122  Temp 98.2 F (36.8 C) (Oral)  Resp 12  Ht 5\' 3"  (1.6 m)  Wt 236 lb 4 oz (107.162 kg)  BMI 41.85 kg/m2  SpO2 94%  General appearance: alert, cooperative and appears stated age Ears: normal TM's and external ear canals both ears Throat: lips, mucosa, and tongue normal; teeth and gums normal Neck: no adenopathy, no carotid bruit, supple, symmetrical, trachea midline and thyroid not enlarged, symmetric, no tenderness/mass/nodules Back: symmetric, no curvature. ROM normal. No CVA tenderness. Lungs: clear to auscultation bilaterally Heart: regular rate and rhythm, S1, S2 normal, no murmur, click, rub or gallop Abdomen: soft, non-tender; bowel sounds normal; no masses,  no organomegaly Pulses: 2+ and symmetric Skin: Skin color, texture, turgor normal. No rashes or lesions Lymph nodes: Cervical, supraclavicular, and axillary nodes normal.  Assessment and Plan:  COPD (chronic obstructive pulmonary disease) With frequent , almost monthly admissions to [facility] for exacerbations. Chest x-rays have been repeatedly free of infiltrate. Most recent overnight admission nov 14th for chest pain.  She states that her breathing is now at baseline.   her frequent decompensations are troubling and of uncertain etiology. She has had pulmonary evaluation by Dr. Kendrick Fries including pulmonary function tests and 2 CTs of the chest this year (last one August ) done at [facility] and showed multiple subcentimeter pulmonary nodules, some calcified, lingular ATX vs fibrosis and no evidence of interstitial lung disease or apical blebs that would make her a candidate for lung resection She is on maximal management .  I am sending her to [facility] for a high-resolution CT at [facility] to make sure a different set of eyes might view the lung parenchyma differently and see interstitial lung disease. I've also advised her she has another decompensation  that does not require EMS transport that she should take herself to the [facility] ER so she can be admitted there and evaluated by Dr. Kendrick Fries and his partners at [facility] pulmonology . I have sent a prescription for Prednisone to [facility] in the event that she has another decompensation over the holiday.   COPD exacerbation 24 hours after patient was seen for hospital followup she called with reports of new onset fever and dyspnea. She's currently taking amoxicillin for a dental procedure and has a prescription for prednisone 60 mg which is sent to her pharmacy yesterday. I recommended that she start the prednisone but if she develops desaturations or worsening dyspnea that she take her self as directed to the [facility].  Diabetes mellitus with complication Hemoglobin A1c is 7.3 currently. Frequent COPD exacerbations resulting in use of prednisone often have made control difficult but she is doing well in spite of these obstacles.   Updated Medication List Outpatient Encounter Prescriptions as of  08/03/2012  Medication Sig Dispense Refill  . albuterol (PROVENTIL HFA) 108 (90 BASE) MCG/ACT inhaler Inhale 2 puffs into the lungs every 6 (six) hours as needed for wheezing.  2 Inhaler  3  . ALPRAZolam (XANAX) 0.25 MG tablet Take 1 tablet (0.25 mg total) by mouth at bedtime as needed.  30 tablet  2  . aspirin 81 MG EC tablet Take 81 mg by mouth daily.        Marland Kitchen atorvastatin (LIPITOR) 80 MG tablet Take 1 tablet (80 mg total) by mouth daily.  30 tablet  11  . budesonide-formoterol (SYMBICORT) 160-4.5 MCG/ACT inhaler Inhale 2 puffs into the lungs 2 (two) times daily.  10.2 g  6  . chlorpheniramine-HYDROcodone (TUSSIONEX) 10-8 MG/5ML LQCR Take 5 mLs by mouth every 8 (eight) hours as needed.  240 mL  0  . Ciclesonide (ZETONNA) 37 MCG/ACT AERS Place 1 Squirt into the nose 2 (two) times daily.  4.7 g  6  . docusate sodium (COLACE) 100 MG capsule Take 200 mg by mouth daily.      . [EXPIRED] fluconazole  (DIFLUCAN) 100 MG tablet Take 1 tablet (100 mg total) by mouth daily.  30 tablet  0  . fluconazole (DIFLUCAN) 200 MG tablet Take 1 tablet (200 mg total) by mouth daily.  1 tablet  0  . insulin aspart (NOVOLOG) 100 UNIT/ML injection Use per home sliding scale while on prednisone  10 mL  3  . ipratropium-albuterol (DUONEB) 0.5-2.5 (3) MG/3ML SOLN Take 3 mLs by nebulization every 4 (four) hours as needed.      . Potassium Gluconate 595 MG CAPS Take 1 capsule by mouth daily.      . promethazine-phenylephrine (PROMETHAZINE-PHENYLEPHRINE) 6.25-5 MG/5ML SYRP Take 5 mLs by mouth every 4 (four) hours as needed.  240 mL  0  . roflumilast (DALIRESP) 500 MCG TABS tablet Take 1 tablet (500 mcg total) by mouth every other day.  15 tablet  5  . tiotropium (SPIRIVA) 18 MCG inhalation capsule Place 1 capsule (18 mcg total) into inhaler and inhale daily.  30 capsule  6  . traMADol (ULTRAM) 50 MG tablet Take 1 tablet (50 mg total) by mouth every 6 (six) hours as needed for pain (or cough).  120 tablet  1  . [DISCONTINUED] glucose blood (ACCU-CHEK SMARTVIEW) test strip Use as instructed  150 each  11  . [DISCONTINUED] insulin glargine (LANTUS) 100 UNIT/ML injection Inject 20 Units into the skin daily.  10 mL  5  . [DISCONTINUED] Insulin Syringe-Needle U-100 (INSULIN SYRINGE .5CC/31GX5/16") 31G X 5/16" 0.5 ML MISC As directed      . [DISCONTINUED] pantoprazole (PROTONIX) 40 MG tablet Take 1 tablet (40 mg total) by mouth 2 (two) times daily.  60 tablet  2  . predniSONE (DELTASONE) 20 MG tablet Take 3 tablets (60 mg total) by mouth daily. For COPD flare  30 tablet  0  . [DISCONTINUED] glipiZIDE (GLUCOTROL) 5 MG tablet Take 1 tablet (5 mg total) by mouth 2 (two) times daily.  60 tablet  11  . [DISCONTINUED] predniSONE (DELTASONE) 50 MG tablet 50 mg. Take for four days      . [DISCONTINUED] predniSONE (STERAPRED UNI-PAK) 10 MG tablet 60 mg twice daily for 3 days,  Then once daily for 3 days, then decrease by 10 mg every 3  days   Until  Gone (21 day taper)  100 tablet  0     Orders Placed This Encounter  Procedures  . CT Chest  W Contrast  . LDL cholesterol, direct  . Microalbumin / creatinine urine ratio  . Hemoglobin A1c  . IgE  . Basic metabolic panel    No Follow-up on file.

## 2012-08-03 NOTE — Telephone Encounter (Signed)
Pantoprazole 40 mg # 60 2 R sent electronic to [facility] pharmacy

## 2012-08-03 NOTE — Telephone Encounter (Signed)
Refill request for Lantus 100 units, Glipizide 5 mg, and accu chek lancet sent to [facility]

## 2012-08-03 NOTE — Patient Instructions (Addendum)
Try Kai Levins Angelo's chicken piccata and eggplant parmigiana  available in frozen section at [employer]  [employer] pasta is very low carb. (5 per serving)   We are scheduling you a high resolution chest CT at [facility] for further evaluation of lung disease

## 2012-08-04 ENCOUNTER — Telehealth: Payer: Self-pay | Admitting: Internal Medicine

## 2012-08-04 NOTE — Telephone Encounter (Signed)
If she is taking amoxicillin , the only thing I would do for her in an office visit is start her on prednisone.  I do not need to see her today.  She can start the prednisone at 60 mg daily for the next three days (I gave her a prescription for it  Yesterday).  I also advised her yesterday that if she gets worse in spite of starting the prednisone she needs to go to the [facility] ER instead of [facility] so she can be admitted and evaluated by the pulmonologists there for a second opinion on why she is having so much trouble with her lungs.

## 2012-08-04 NOTE — Telephone Encounter (Signed)
Patient Information:  Caller Name: Eunice Blase  Phone: [phone]  Patient: Catherine [name], Catherine [name]  Gender: Female  DOB: 11/12/1955  Age: 56 Years  PCP: Duncan Dull (Adults only)   Symptoms  Reason For Call & Symptoms: seen in the office yesterday 11/26 as a hospital f/u, when she got home she had a temp and today same is 102.5 and have breathing issues  Reviewed Health History In EMR: Yes  Reviewed Medications In EMR: Yes  Reviewed Allergies In EMR: Yes  Date of Onset of Symptoms: 08/03/2012  Treatments Tried: Tylenol  Treatments Tried Worked: No  Any Fever: Yes  Fever Taken: Axillary  Fever Time Of Reading: 09:00:16  Fever Last Reading: 102.5  Guideline(s) Used:  Headache  Disposition Per Guideline:   Go to Office Now  Reason For Disposition Reached:   Fever > 100.5 F (38.1 C) and has diabetes mellitus or a weak immune system (e.g., HIV positive, cancer chemotherapy, organ transplant, splenectomy, chronic steroids)  Advice Given:  N/A  Office Follow Up:  Does the office need to follow up with this patient?: Yes  Instructions For The Office: Needs today appt.  RN Note:  Forgot to tell MD that she is on Amoxicillin for tooth extraction.

## 2012-08-04 NOTE — Telephone Encounter (Signed)
Routed to Dr. Darrick Huntsman

## 2012-08-04 NOTE — Telephone Encounter (Signed)
Spoke to patient gave her instructions as directed. 

## 2012-08-05 ENCOUNTER — Encounter: Payer: Self-pay | Admitting: Internal Medicine

## 2012-08-05 NOTE — Assessment & Plan Note (Signed)
Hemoglobin A1c is 7.3 currently. Frequent COPD exacerbations resulting in use of prednisone often have made control difficult but she is doing well in spite of these obstacles.

## 2012-08-05 NOTE — Assessment & Plan Note (Signed)
24 hours after patient was seen for hospital followup she called with reports of new onset fever and dyspnea. She's currently taking amoxicillin for a dental procedure and has a prescription for prednisone 60 mg which is sent to her pharmacy yesterday. I recommended that she start the prednisone but if she develops desaturations or worsening dyspnea that she take her self as directed to the [facility].

## 2012-08-13 ENCOUNTER — Ambulatory Visit (INDEPENDENT_AMBULATORY_CARE_PROVIDER_SITE_OTHER)
Admission: RE | Admit: 2012-08-13 | Discharge: 2012-08-13 | Disposition: A | Discharge status: Home / Self Care | Payer: PRIVATE HEALTH INSURANCE | Source: Ambulatory Visit | Attending: Internal Medicine | Admitting: Internal Medicine

## 2012-08-13 DIAGNOSIS — J439 Emphysema, unspecified: Secondary | ICD-10-CM

## 2012-08-13 DIAGNOSIS — J438 Other emphysema: Secondary | ICD-10-CM

## 2012-08-13 MED ORDER — IOHEXOL 300 MG/ML  SOLN
80.0000 mL | Freq: Once | INTRAMUSCULAR | Status: AC | PRN
  Administered 2012-08-13: 80 mL via INTRAVENOUS

## 2012-08-17 ENCOUNTER — Telehealth: Payer: Self-pay | Admitting: Internal Medicine

## 2012-08-17 NOTE — Telephone Encounter (Signed)
Call patient regarding her test results. She will be at home all day.

## 2012-08-20 ENCOUNTER — Telehealth: Payer: Self-pay

## 2012-08-20 NOTE — Telephone Encounter (Signed)
Patient called , wanted to know results of CT scan that was done on 08/13/12

## 2012-08-21 NOTE — Telephone Encounter (Signed)
Per your notes, you spoke with her spouse on one occasion and left a detailed message on her voicemail two days later.  Didn;t she get either message?

## 2012-08-28 ENCOUNTER — Inpatient Hospital Stay: Payer: Self-pay | Admitting: Family Medicine

## 2012-08-28 ENCOUNTER — Ambulatory Visit: Payer: Self-pay

## 2012-08-28 LAB — CBC
HCT: 35.7 % (ref 35.0–47.0)
HGB: 11.9 g/dL — ABNORMAL LOW (ref 12.0–16.0)
Platelet: 225 10*3/uL (ref 150–440)
RBC: 4.02 10*6/uL (ref 3.80–5.20)
RDW: 14.3 % (ref 11.5–14.5)
WBC: 13.4 10*3/uL — ABNORMAL HIGH (ref 3.6–11.0)

## 2012-08-28 LAB — COMPREHENSIVE METABOLIC PANEL
Albumin: 2.8 g/dL — ABNORMAL LOW (ref 3.4–5.0)
BUN: 10 mg/dL (ref 7–18)
Bilirubin,Total: 0.5 mg/dL (ref 0.2–1.0)
Chloride: 105 mmol/L (ref 98–107)
Co2: 29 mmol/L (ref 21–32)
Creatinine: 0.81 mg/dL (ref 0.60–1.30)
EGFR (African American): >60
Glucose: 187 mg/dL — ABNORMAL HIGH (ref 65–99)
Osmolality: 285 (ref 275–301)
SGPT (ALT): 31 U/L (ref 12–78)
Sodium: 141 mmol/L (ref 136–145)
Total Protein: 6.9 g/dL (ref 6.4–8.2)

## 2012-08-28 LAB — TROPONIN I: Troponin-I: <0.02 ng/mL

## 2012-08-28 LAB — PRO B NATRIURETIC PEPTIDE: B-Type Natriuretic Peptide: 20 pg/mL (ref 0–125)

## 2012-08-28 LAB — CK TOTAL AND CKMB (NOT AT ARMC): CK, Total: 298 U/L — ABNORMAL HIGH (ref 21–215)

## 2012-08-29 LAB — BASIC METABOLIC PANEL
Anion Gap: 8 (ref 7–16)
BUN: 12 mg/dL (ref 7–18)
Co2: 27 mmol/L (ref 21–32)
Creatinine: 0.54 mg/dL — ABNORMAL LOW (ref 0.60–1.30)
EGFR (African American): >60
EGFR (Non-African Amer.): >60
Osmolality: 287 (ref 275–301)
Sodium: 138 mmol/L (ref 136–145)

## 2012-08-29 LAB — CBC WITH DIFFERENTIAL/PLATELET
Basophil %: 0.2 %
Eosinophil #: 0 10*3/uL (ref 0.0–0.7)
Eosinophil %: 0 %
Lymphocyte #: 1.1 10*3/uL (ref 1.0–3.6)
Lymphocyte %: 9.5 %
MCH: 29.5 pg (ref 26.0–34.0)
MCHC: 33.2 g/dL (ref 32.0–36.0)
Monocyte #: 0.2 x10 3/mm (ref 0.2–0.9)
Neutrophil #: 9.9 10*3/uL — ABNORMAL HIGH (ref 1.4–6.5)
Neutrophil %: 88.8 %
RBC: 4.08 10*6/uL (ref 3.80–5.20)

## 2012-08-30 ENCOUNTER — Telehealth: Payer: Self-pay | Admitting: Internal Medicine

## 2012-08-30 DIAGNOSIS — I517 Cardiomegaly: Secondary | ICD-10-CM

## 2012-08-30 LAB — CBC WITH DIFFERENTIAL/PLATELET
Basophil %: 0.1 %
Eosinophil %: 0 %
HCT: 34.6 % — ABNORMAL LOW (ref 35.0–47.0)
Lymphocyte %: 6 %
MCH: 30.2 pg (ref 26.0–34.0)
MCHC: 34.3 g/dL (ref 32.0–36.0)
MCV: 88 fL (ref 80–100)
Monocyte #: 0.5 x10 3/mm (ref 0.2–0.9)
Neutrophil #: 17.9 10*3/uL — ABNORMAL HIGH (ref 1.4–6.5)
Platelet: 263 10*3/uL (ref 150–440)
RBC: 3.93 10*6/uL (ref 3.80–5.20)
RDW: 14 % (ref 11.5–14.5)
WBC: 19.6 10*3/uL — ABNORMAL HIGH (ref 3.6–11.0)

## 2012-08-30 LAB — BASIC METABOLIC PANEL
Anion Gap: 7 (ref 7–16)
Calcium, Total: 9 mg/dL (ref 8.5–10.1)
Chloride: 104 mmol/L (ref 98–107)
Co2: 28 mmol/L (ref 21–32)
Creatinine: 0.75 mg/dL (ref 0.60–1.30)
EGFR (African American): >60
Glucose: 299 mg/dL — ABNORMAL HIGH (ref 65–99)
Osmolality: 290 (ref 275–301)
Potassium: 3.7 mmol/L (ref 3.5–5.1)

## 2012-08-30 NOTE — Telephone Encounter (Signed)
Beth with [facility] called stating that Catherine [name] is currently an inpt at [facility]. Waynetta Sandy is requesting a copy of the patient's recent CT Chest report because [facility] is not on Epic and can't see reports. After researching, I advised Beth, Dr. Darrick Huntsman reviewed and CMA made attempts to notify the patient of results. Patient my be unaware of results. Beth stated she would highlight when charted. I advised RMF about my concerns. djc

## 2012-09-02 ENCOUNTER — Telehealth: Payer: Self-pay | Admitting: Internal Medicine

## 2012-09-02 MED ORDER — ALBUTEROL SULFATE HFA 108 (90 BASE) MCG/ACT IN AERS: 2.0000 | INHALATION_SPRAY | Freq: Four times a day (QID) | RESPIRATORY_TRACT | Status: DC | PRN

## 2012-09-02 MED ORDER — INSULIN GLARGINE 100 UNIT/ML ~~LOC~~ SOLN: 20.0000 [IU] | Freq: Every day | SUBCUTANEOUS | Status: DC

## 2012-09-02 MED ORDER — PAROXETINE HCL 10 MG PO TABS: 10.0000 mg | ORAL_TABLET | ORAL | Status: DC

## 2012-09-02 NOTE — Telephone Encounter (Signed)
Refill iprat-albut 0.5-3 (2.5) mg   For duoneb 0.5 mg-3 mg/27ml 540 ml Use 1 vial in nebulizer every 4 hours as needed  [facility] administrator # [phone] Fax # [phone]

## 2012-09-02 NOTE — Telephone Encounter (Signed)
Refill request for paroxetine hcl 10 mg tabl Qty: 30 Sig: take one tablet (10mg  total) by mouth every morning  Lantus 100 units/ml vial Qty:10 ml Sig: inject 20 units into the skin daily

## 2012-09-02 NOTE — Telephone Encounter (Signed)
Med filled.  

## 2012-09-02 NOTE — Telephone Encounter (Signed)
Pt stated she went to Urgent Care on Saturday for SOB they sent her to Hospital where she was admitted her O2 sat were in the 80's. Pt told she had acute COPD with bronchitis exacerbation. Pt has a hospital follow up with you on 09/07/12.

## 2012-09-02 NOTE — Telephone Encounter (Signed)
Ok for these refills?They are not currently on Pt med list.

## 2012-09-02 NOTE — Telephone Encounter (Signed)
Yes, both rxs sent to [facility] pharmacy

## 2012-09-03 ENCOUNTER — Telehealth: Payer: Self-pay | Admitting: Internal Medicine

## 2012-09-03 ENCOUNTER — Other Ambulatory Visit: Payer: Self-pay | Admitting: Internal Medicine

## 2012-09-03 ENCOUNTER — Other Ambulatory Visit: Payer: Self-pay | Admitting: General Practice

## 2012-09-03 LAB — CULTURE, BLOOD (SINGLE)

## 2012-09-03 MED ORDER — IPRATROPIUM-ALBUTEROL 0.5-2.5 (3) MG/3ML IN SOLN: 3.0000 mL | RESPIRATORY_TRACT | Status: DC | PRN

## 2012-09-07 ENCOUNTER — Ambulatory Visit (INDEPENDENT_AMBULATORY_CARE_PROVIDER_SITE_OTHER): Payer: PRIVATE HEALTH INSURANCE | Admitting: Internal Medicine

## 2012-09-07 ENCOUNTER — Encounter: Payer: Self-pay | Admitting: Internal Medicine

## 2012-09-07 VITALS — BP 126/84 | HR 111 | Temp 98.0°F | Resp 17 | Wt 238.0 lb

## 2012-09-07 DIAGNOSIS — R600 Localized edema: Secondary | ICD-10-CM

## 2012-09-07 DIAGNOSIS — B3731 Acute candidiasis of vulva and vagina: Secondary | ICD-10-CM

## 2012-09-07 DIAGNOSIS — B373 Candidiasis of vulva and vagina: Secondary | ICD-10-CM

## 2012-09-07 DIAGNOSIS — E118 Type 2 diabetes mellitus with unspecified complications: Secondary | ICD-10-CM

## 2012-09-07 DIAGNOSIS — E083319 Diabetes mellitus due to underlying condition with moderate nonproliferative diabetic retinopathy with macular edema, unspecified eye: Secondary | ICD-10-CM

## 2012-09-07 DIAGNOSIS — Z1331 Encounter for screening for depression: Secondary | ICD-10-CM

## 2012-09-07 DIAGNOSIS — J449 Chronic obstructive pulmonary disease, unspecified: Secondary | ICD-10-CM

## 2012-09-07 DIAGNOSIS — J9611 Chronic respiratory failure with hypoxia: Secondary | ICD-10-CM

## 2012-09-07 DIAGNOSIS — Z79899 Other long term (current) drug therapy: Secondary | ICD-10-CM

## 2012-09-07 MED ORDER — FLUCONAZOLE 200 MG PO TABS: 200.0000 mg | ORAL_TABLET | Freq: Every day | ORAL | Status: DC

## 2012-09-07 MED ORDER — INSULIN GLARGINE 100 UNIT/ML ~~LOC~~ SOLN: 25.0000 [IU] | Freq: Every day | SUBCUTANEOUS | Status: DC

## 2012-09-07 MED ORDER — HYDROCOD POLST-CHLORPHEN POLST 10-8 MG/5ML PO LQCR: 5.0000 mL | Freq: Three times a day (TID) | ORAL | Status: DC | PRN

## 2012-09-07 MED ORDER — FUROSEMIDE 20 MG PO TABS: 20.0000 mg | ORAL_TABLET | Freq: Every day | ORAL | Status: DC

## 2012-09-07 MED ORDER — PREDNISONE 10 MG PO TABS: ORAL_TABLET | ORAL | Status: DC

## 2012-09-07 NOTE — Patient Instructions (Addendum)
i am increasing your prednisone to 60 mg daily for one week,  And we will reduce it by 10 mg weekly .    I will call in Tussionex , diflucan and lasix for the fluid retention to [facility]   Take the lasix once daily in the morning for fluid retention until the edema has resolved.  then use prn   Please make an appt with Dr Kendrick Fries before you leave so he can go over the CT scan results with you.   There are multiple small pulmonary nodules seen that are going to need follow up CT surveillance in a few months  Increase your potassium supplement tow two daily and we will check your level next week when you see dr Kendrick Fries

## 2012-09-08 DIAGNOSIS — B3731 Acute candidiasis of vulva and vagina: Secondary | ICD-10-CM | POA: Insufficient documentation

## 2012-09-08 DIAGNOSIS — R600 Localized edema: Secondary | ICD-10-CM | POA: Insufficient documentation

## 2012-09-08 DIAGNOSIS — B373 Candidiasis of vulva and vagina: Secondary | ICD-10-CM | POA: Insufficient documentation

## 2012-09-08 NOTE — Assessment & Plan Note (Signed)
With recent [facility] admission December 21 for acute on chronic failure. She states that at home her ambulatory sats drop into the mid 80s despite use of 2 L of oxygen. At rest her sats are 94%. She is on maximal bronchodilator therapy. Review of home environment is notable for dogs who live in the house but apparently not in the rooms that she frequents. Her husband is not a smoker. They have central air. If they do have carpeting. I've increased her steroids to 60 mg daily and we'll start a very slow taper. She will follow up with Dr. Kendrick Fries next week.

## 2012-09-08 NOTE — Assessment & Plan Note (Signed)
Secondary to steroids versus right-sided heart failure from pulmonary hypertension from severe COPD. Will review most recent echocardiogram. I've given her prescription for low-dose Lasix 20 mg daily as needed

## 2012-09-08 NOTE — Assessment & Plan Note (Addendum)
Fluconazole prescription sent to her pharmacy.

## 2012-09-08 NOTE — Assessment & Plan Note (Signed)
With monthly admissions for respiratory distress. CT high-resolution none at [name] prior to most recent admission was notable for centrilobular emphysema multiple subcentimeter pulmonary nodules. I've counseled her today and the need for more aggressive therapy as she is now steroid dependent. She does not want a tertiary care center referral as she does not feel that her insurance will pay for it. I've asked her to follow up with Dr. Kendrick Fries next week to go over the results of her CT scan and determine whether there is any role here for lung resection.

## 2012-09-08 NOTE — Progress Notes (Signed)
Patient ID: Catherine [name], female   DOB: December 22, 1955, 57 y.o.   MRN: [id number]  Patient Active Problem List  Diagnosis  . CAD (coronary artery disease)  . HTN (hypertension)  . Hyperlipidemia  . Tobacco abuse, in remission  . Screening for colon cancer  . Gastritis and duodenitis  . Screening for breast cancer  . GERD (gastroesophageal reflux disease)  . Obesity (BMI 30-39.9)  . Depression  . Anxiety  . Diabetes mellitus with complication  . COPD (chronic obstructive pulmonary disease)  . Cough  . Chronic hypoxemic respiratory failure  . COPD exacerbation    Subjective:  CC:   Chief Complaint  Patient presents with  . Follow-up    Hosp Follow Up    HPI:   Catherine [name] a 57 y.o. female who presents for another Hospital followup. Patient was hospitalized from December 21 to December 24 at [facility] for COPD exacerbation culminating in acute on chronic respiratory failure. She was also noted on November 14 for COPD exacerbation with chest pain. She was again in  respiratory distress but did not require intubation. She was treated with Levaquin,  supplemental oxygen and high-dose steroids. She had a high resolution CT scan done at [facility] prior to her admission as part of a workup for her severe emphysema so this was not repeated.  The CT showed centrilobular emphysema and multiple subcentimeter pulmonary nodules. She has not followed up with Dr. Kendrick Fries yet to go over the management of these findings. She states that with the current taper of steroids that she was directed to undergo after discharge she has had recurrence of shortness of breath. She states that at rest on 2 L of oxygen her sats are in the 90s but with any physical exertion J. date drop into the 80s. Her cough is quite incessant and nonproductive at this point. She denies fevers. She does not want is to have a second opinion from an academic center for her persistent COPD symptoms because she states that her  insurance will not pay for it . She has also developed a vaginitis from the recent use of antibiotics and steroids and has noted some lower extremity edema which is new.  Past Medical History  Diagnosis Date  . Diabetes mellitus   . Leukocytosis   . Hyperlipidemia   . COPD (chronic obstructive pulmonary disease)   . Hyperglycemia   . Hypertension   . S/P cardiac catheterization March 2012    normal coronaries,  due to chest pain ([place])  . Tobacco abuse, in remission     quit oct during hospitalization   . Screening for colon cancer June 2011    polyps found, next one due 2014 ([place])  . Gastritis and duodenitis June 2011    EGD  . Screening for breast cancer Jul 28 2011    normal  . GERD (gastroesophageal reflux disease)   . Obesity (BMI 30-39.9)   . Depression   . Anxiety     Past Surgical History  Procedure Date  . Cardiac catheterization 11/22/2010    no significant disease  . Abdominal hysterectomy   . Hemorrhoid surgery   . Abdominal hysterectomy   . Bilateral oophorectomy 1995    and TAH.  noncancerous reasons         The following portions of the patient's history were reviewed and updated as appropriate: Allergies, current medications, and problem list.    Review of Systems:   Patient denies headache, fevers, malaise, unintentional  weight loss, skin rash, eye pain, sinus congestion and sinus pain, sore throat, dysphagia,  hemoptysis ,  wheezing, chest pain, palpitations,abdominal pain, nausea, melena, diarrhea, constipation, flank pain, dysuria, hematuria, urinary  Frequency, nocturia, numbness, tingling, seizures,  Focal weakness, Loss of consciousness,  Tremor, insomnia, depression, anxiety, and suicidal ideation.        History   Social History  . Marital Status: Married    Spouse Name: N/A    Number of Children: 4  . Years of Education: N/A   Occupational History  . Secretary    Social History Main Topics  . Smoking status: Former Smoker  -- 1.0 packs/day for 40 years    Types: Cigarettes    Quit date: 06/27/2011  . Smokeless tobacco: Never Used  . Alcohol Use: No  . Drug Use: No  . Sexually Active: Not on file   Other Topics Concern  . Not on file   Social History Narrative  . No narrative on file    Objective:  BP 126/84  Pulse 111  Temp 98 F (36.7 C) (Oral)  Resp 17  Wt 238 lb (107.956 kg)  SpO2 94%  General appearance: alert, cooperative and appears stated age, coughing frequently Ears: normal TM's and external ear canals both ears Throat: lips, mucosa, and tongue normal; teeth and gums normal Neck: no adenopathy, no carotid bruit, supple, symmetrical, trachea midline and thyroid not enlarged, symmetric, no tenderness/mass/nodules Back: symmetric, no curvature. ROM normal. No CVA tenderness. Lungs: Adequate air movement bilaterally. No wheezing on exam currently. She is not using accessory muscles and is wearing O2. Heart: regular rate and rhythm, S1, S2 normal, no murmur, click, rub or gallop Abdomen: soft, non-tender; bowel sounds normal; no masses,  no organomegaly Pulses: 2+ and symmetric Skin: Skin color, texture, turgor normal. No rashes or lesions Lymph nodes: Cervical, supraclavicular, and axillary nodes normal.  Assessment and Plan:  COPD (chronic obstructive pulmonary disease) With monthly admissions for respiratory distress. CT high-resolution none at [name] prior to most recent admission was notable for centrilobular emphysema multiple subcentimeter pulmonary nodules. I've counseled her today and the need for more aggressive therapy as she is now steroid dependent. She does not want a tertiary care center referral as she does not feel that her insurance will pay for it. I've asked her to follow up with Dr. Kendrick Fries next week to go over the results of her CT scan and determine whether there is any role here for lung resection.  Chronic hypoxemic respiratory failure With recent [facility]  admission December 21 for acute on chronic failure. She states that at home her ambulatory sats drop into the mid 80s despite use of 2 L of oxygen. At rest her sats are 94%. She is on maximal bronchodilator therapy. Review of home environment is notable for dogs who live in the house but apparently not in the rooms that she frequents. Her husband is not a smoker. They have central air. If they do have carpeting. I've increased her steroids to 60 mg daily and we'll start a very slow taper. She will follow up with Dr. Kendrick Fries next week.  Diabetes mellitus with complication Her blood sugars have been elevated because of the steroids. She is using a sliding scale to manage the hyperglycemia.  Edema leg Secondary to steroids versus right-sided heart failure from pulmonary hypertension from severe COPD. Will review most recent echocardiogram. I've given her prescription for low-dose Lasix 20 mg daily as needed  Vaginitis due to Candida  Fluconazole prescription sent to her pharmacy.   Updated Medication List Outpatient Encounter Prescriptions as of 09/07/2012  Medication Sig Dispense Refill  . albuterol (PROVENTIL HFA) 108 (90 BASE) MCG/ACT inhaler Inhale 2 puffs into the lungs every 6 (six) hours as needed for wheezing.  2 Inhaler  3  . ALPRAZolam (XANAX) 0.25 MG tablet Take 1 tablet (0.25 mg total) by mouth at bedtime as needed.  30 tablet  2  . aspirin 81 MG EC tablet Take 81 mg by mouth daily.        Marland Kitchen atorvastatin (LIPITOR) 80 MG tablet Take 1 tablet (80 mg total) by mouth daily.  30 tablet  11  . budesonide-formoterol (SYMBICORT) 160-4.5 MCG/ACT inhaler Inhale 2 puffs into the lungs 2 (two) times daily.  10.2 g  6  . chlorpheniramine-HYDROcodone (TUSSIONEX) 10-8 MG/5ML LQCR Take 5 mLs by mouth every 8 (eight) hours as needed.  240 mL  3  . docusate sodium (COLACE) 100 MG capsule Take 200 mg by mouth daily.      . fluconazole (DIFLUCAN) 200 MG tablet Take 1 tablet (200 mg total) by mouth daily.   2 tablet  1  . glipiZIDE (GLUCOTROL) 5 MG tablet Take 1 tablet (5 mg total) by mouth 2 (two) times daily.  60 tablet  11  . glucose blood (PRECISION QID TEST) test strip Use as instructed  100 each  12  . insulin aspart (NOVOLOG) 100 UNIT/ML injection Use per home sliding scale while on prednisone  10 mL  3  . insulin glargine (LANTUS SOLOSTAR) 100 UNIT/ML injection Inject 25 Units into the skin at bedtime.  5 pen  PRN  . Insulin Syringe-Needle U-100 (INSULIN SYRINGE .5CC/31GX5/16") 31G X 5/16" 0.5 ML MISC Test twice a day  102 each  11  . ipratropium-albuterol (DUONEB) 0.5-2.5 (3) MG/3ML SOLN Take 3 mLs by nebulization every 4 (four) hours as needed.  360 mL  2  . pantoprazole (PROTONIX) 40 MG tablet Take 1 tablet (40 mg total) by mouth 2 (two) times daily.  60 tablet  2  . PARoxetine (PAXIL) 10 MG tablet Take 1 tablet (10 mg total) by mouth every morning.  30 tablet  5  . Potassium Gluconate 595 MG CAPS Take 1 capsule by mouth daily.      . predniSONE (DELTASONE) 20 MG tablet Take 3 tablets (60 mg total) by mouth daily. For COPD flare  30 tablet  0  . promethazine-phenylephrine (PROMETHAZINE-PHENYLEPHRINE) 6.25-5 MG/5ML SYRP Take 5 mLs by mouth every 4 (four) hours as needed.  240 mL  0  . roflumilast (DALIRESP) 500 MCG TABS tablet Take 1 tablet (500 mcg total) by mouth every other day.  15 tablet  5  . tiotropium (SPIRIVA) 18 MCG inhalation capsule Place 1 capsule (18 mcg total) into inhaler and inhale daily.  30 capsule  6  . traMADol (ULTRAM) 50 MG tablet Take 1 tablet (50 mg total) by mouth every 6 (six) hours as needed for pain (or cough).  120 tablet  1  . [DISCONTINUED] chlorpheniramine-HYDROcodone (TUSSIONEX) 10-8 MG/5ML LQCR Take 5 mLs by mouth every 8 (eight) hours as needed.  240 mL  0  . [DISCONTINUED] Ciclesonide (ZETONNA) 37 MCG/ACT AERS Place 1 Squirt into the nose 2 (two) times daily.  4.7 g  6  . [DISCONTINUED] fluconazole (DIFLUCAN) 200 MG tablet Take 1 tablet (200 mg total) by  mouth daily.  1 tablet  0  . [DISCONTINUED] insulin glargine (LANTUS SOLOSTAR) 100 UNIT/ML injection Inject  20 Units into the skin at bedtime.  5 pen  PRN  . [DISCONTINUED] insulin NPH-insulin regular (HUMULIN 70/30) (70-30) 100 UNIT/ML injection Inject 20 Units into the skin daily.  10 mL  12  . furosemide (LASIX) 20 MG tablet Take 1 tablet (20 mg total) by mouth daily.  30 tablet  3  . predniSONE (DELTASONE) 10 MG tablet 60 mg daily for one week,  Reduce by 10  Mg weekly  126 tablet  0

## 2012-09-08 NOTE — Assessment & Plan Note (Signed)
Her blood sugars have been elevated because of the steroids. She is using a sliding scale to manage the hyperglycemia.

## 2012-09-10 ENCOUNTER — Other Ambulatory Visit: Payer: Self-pay | Admitting: Internal Medicine

## 2012-09-10 NOTE — Telephone Encounter (Signed)
Pt is needing needles for Lantis and Novalog. She uses [facility] on [address]

## 2012-09-13 MED ORDER — "INSULIN SYRINGE 31G X 5/16"" 0.5 ML MISC": Status: DC

## 2012-09-13 NOTE — Telephone Encounter (Signed)
Refilled script for insulin needles

## 2012-09-14 ENCOUNTER — Encounter: Payer: Self-pay | Admitting: Pulmonary Disease

## 2012-09-14 ENCOUNTER — Ambulatory Visit (INDEPENDENT_AMBULATORY_CARE_PROVIDER_SITE_OTHER): Payer: 59 | Admitting: Pulmonary Disease

## 2012-09-14 VITALS — BP 130/82 | HR 117 | Temp 97.9°F | Ht 63.0 in | Wt 238.0 lb

## 2012-09-14 DIAGNOSIS — Z79899 Other long term (current) drug therapy: Secondary | ICD-10-CM

## 2012-09-14 DIAGNOSIS — J449 Chronic obstructive pulmonary disease, unspecified: Secondary | ICD-10-CM

## 2012-09-14 DIAGNOSIS — R911 Solitary pulmonary nodule: Secondary | ICD-10-CM

## 2012-09-14 DIAGNOSIS — R0602 Shortness of breath: Secondary | ICD-10-CM

## 2012-09-14 MED ORDER — DOXYCYCLINE HYCLATE 100 MG PO TABS: 100.0000 mg | ORAL_TABLET | Freq: Two times a day (BID) | ORAL | Status: AC

## 2012-09-14 MED ORDER — FLUCONAZOLE 100 MG PO TABS: 100.0000 mg | ORAL_TABLET | Freq: Every day | ORAL | Status: AC

## 2012-09-14 NOTE — Progress Notes (Signed)
Subjective:    Patient ID: Catherine [name], female    DOB: 11/14/1955, 57 y.o.   MRN: [id number]  Synopsis: Catherine [name] first saw the [facility] Pulmonary clinic in June 2013 for GOLD Grade D COPD.  She has frequent exacerbations, poor functional status, and her FEV1 is 0.91 L (41% pred).  She smoked 1 ppd for 40 years and quit in 2012.  HPI  04/19/12 ROV -- since her last visit Catherine [name] was admitted to [facility] for shortness of breath. I do not have record of this yet but apparently she ruled out for an MI and was treated with steroids. Since her hospital discharge she says that she has had no improvement in her shortness of breath. She continues to have cough without sputum production and she notes chest congestion and chest tightness. She has not had leg swelling. She does not have fever or chills but she has noted frequent sweats. She continues to use oxygen at 2 L continuously which was started during this hospitalization and she is still on a prednisone taper (currently taking 20 mg daily).  05/03/12 ROV --Since her last visit Catherine [name] says that she initially improved with the moxifloxacin therapy that we wrote. However she is still having a lot of trouble with cough and her coughing spells are causing significant shortness of breath and wheezing. She states that when she is at rest she does not wheeze or have shortness of breath. However when she starts coughing her shortness of breath becomes quite significant. She has noted more sinus congestion and sneezing which she thinks may be due to allergies. She notes some indigestion at home and takes protonix for acid reflux. She has not had fevers or chills since her hospitalization. She has chest tightness and some body cramps but has not had significant swelling in her feet.  07/05/2012 ROV --Catherine [name] returns to clinic today stating that she feels lousy. She says that for the last week she has had increasing chest congestion without fevers  or chills. She has had sweats though. She states that she's had increasing shortness of breath in the last 2-3 days and almost went to the emergency department last night. She denies significant sinus symptoms but she states that her coughing spells are lengthy. She feels chest congestion but cannot produce sputum. She has not been well enough to get a flu shot yet this year.  09/14/12 ROV -- Catherine [name] looks better to me today than she has on any of her prior visits. Of course, she was actually recently discharged from the hospital for a flare of COPD. She was treated there with Solu-Medrol and Levaquin. She is currently on a long taper of prednisone and is now down to 10 mg daily. She says that right now she feels "great" and she wants to get up and exercise more. She had some leg swelling during the most recent hospitalization and that has improved.   Past Medical History  Diagnosis Date  . Diabetes mellitus   . Leukocytosis   . Hyperlipidemia   . COPD (chronic obstructive pulmonary disease)   . Hyperglycemia   . Hypertension   . S/P cardiac catheterization March 2012    normal coronaries,  due to chest pain ([place])  . Tobacco abuse, in remission     quit oct during hospitalization   . Screening for colon cancer June 2011    polyps found, next one due 2014 ([place])  . Gastritis and duodenitis June 2011  EGD  . Screening for breast cancer Jul 28 2011    normal  . GERD (gastroesophageal reflux disease)   . Obesity (BMI 30-39.9)   . Depression   . Anxiety     Review of Systems  Constitutional: Negative for fever, chills and unexpected weight change.  HENT: Negative for nosebleeds, congestion, rhinorrhea, sneezing and postnasal drip.   Respiratory: Positive for cough and shortness of breath. Negative for choking.   Cardiovascular: Negative for chest pain and leg swelling.       Objective:   Physical Exam   Filed Vitals:   09/14/12 1648  BP: 130/82  Pulse: 117  Temp: 97.9 F  (36.6 C)  TempSrc: Oral  Height: 5\' 3"  (1.6 m)  Weight: 238 lb (107.956 kg)  SpO2: 94%   Gen: obese, speaking in full sentences HEENT: NCAT, PERRL, EOMi, OP clear, neck supple without masses PULM: CTA B, no wheezing CV: RRR, no mgr, no JVD AB: BS+, soft, nontender, no hsm Ext: warm, no edema, no clubbing, no cyanosis   09/2011 and 12/2011 CXR from [facility] reviewed: no clear parenchymal abnormality, some vascular congestion  04/2012 pulmonary function test ratio 50%, FEV1 0.92 L (41%), TLC 3.39 L (72% predicted) ERV (15% predicted), DLCO 51% predicted  04/2012 CT Chest: no PE, no infiltrate, no evidence of fibrosis  08/2013 CT Chest: No PE, bilateral centrilobular emphysema; few areas of clustered sub centimeter nodules     Assessment & Plan:   COPD (chronic obstructive pulmonary disease) We continue to struggle with Catherine [name]'s COPD.  She has fallen off of the proverbial cliff in the last year, and we have yet to identify the reason why.  She has been admitted 6 times or more for exacerbations of her COPD.  There is no clear infectious or vascular cause of her rapid decline.  I question whether or not there is an allergen in the home contributing, but she has lived there for years and hasn't noticed recent changes.  Her most recent CT chest showed some clustered nodules which can suggest a fungal or mycobacterial infection, but she does not produce sputum so this seems less likely.  I don't think she would tolerate a bronch to evaluate for this.  At this point she is on maximum therapy and continuing to require monthly hospitalizations.  I can't disagree with the continuous prednisone for now, but it clearly has severe consequences considering her obesity and diabetes.  Plan: -RAST testing -continue spiriva, symbicort, O2, albuterol -continue prednisone daily 10mg  for now -continue O2 as written -Refer for a second opinion to Dr. Otto Herb at [facility] -consider RHC to evaluate for pulm  hypertension, but wait for Dr. Cecil Cobbs input first -Refer for pulmonary rehabilitation  Pulmonary nodule I think that these nodules are likely infectious/reactive in origin.  She has since been treated with Abx so we expect that they will be gone on the 3 month follow up CT which I have ordered.  I doubt she would tolerate a bronch for evaluation.    Updated Medication List Outpatient Encounter Prescriptions as of 09/14/2012  Medication Sig Dispense Refill  . albuterol (PROVENTIL HFA) 108 (90 BASE) MCG/ACT inhaler Inhale 2 puffs into the lungs every 6 (six) hours as needed for wheezing.  2 Inhaler  3  . ALPRAZolam (XANAX) 0.25 MG tablet Take 1 tablet (0.25 mg total) by mouth at bedtime as needed.  30 tablet  2  . aspirin 81 MG EC tablet Take 81 mg by mouth daily.        Marland Kitchen  atorvastatin (LIPITOR) 80 MG tablet Take 1 tablet (80 mg total) by mouth daily.  30 tablet  11  . budesonide-formoterol (SYMBICORT) 160-4.5 MCG/ACT inhaler Inhale 2 puffs into the lungs 2 (two) times daily.  10.2 g  6  . chlorpheniramine-HYDROcodone (TUSSIONEX) 10-8 MG/5ML LQCR Take 5 mLs by mouth every 8 (eight) hours as needed.  240 mL  3  . docusate sodium (COLACE) 100 MG capsule Take 200 mg by mouth daily.      . furosemide (LASIX) 20 MG tablet Take 20 mg by mouth daily as needed.      Marland Kitchen glipiZIDE (GLUCOTROL) 5 MG tablet Take 1 tablet (5 mg total) by mouth 2 (two) times daily.  60 tablet  11  . glucose blood (PRECISION QID TEST) test strip Use as instructed  100 each  12  . insulin aspart (NOVOLOG) 100 UNIT/ML injection Use per home sliding scale while on prednisone  10 mL  3  . insulin glargine (LANTUS SOLOSTAR) 100 UNIT/ML injection Inject 25 Units into the skin at bedtime.  5 pen  PRN  . Insulin Syringe-Needle U-100 (INSULIN SYRINGE .5CC/31GX5/16") 31G X 5/16" 0.5 ML MISC Test twice a day  102 each  11  . ipratropium-albuterol (DUONEB) 0.5-2.5 (3) MG/3ML SOLN Take 3 mLs by nebulization every 4 (four) hours as needed.   360 mL  2  . pantoprazole (PROTONIX) 40 MG tablet Take 1 tablet (40 mg total) by mouth 2 (two) times daily.  60 tablet  2  . PARoxetine (PAXIL) 10 MG tablet Take 1 tablet (10 mg total) by mouth every morning.  30 tablet  5  . Potassium Gluconate 595 MG CAPS Take 2 capsules by mouth daily.       . predniSONE (DELTASONE) 10 MG tablet 60 mg daily for one week,  Reduce by 10  Mg weekly  126 tablet  0  . roflumilast (DALIRESP) 500 MCG TABS tablet Take 1 tablet (500 mcg total) by mouth every other day.  15 tablet  5  . tiotropium (SPIRIVA) 18 MCG inhalation capsule Place 1 capsule (18 mcg total) into inhaler and inhale daily.  30 capsule  6  . traMADol (ULTRAM) 50 MG tablet Take 1 tablet (50 mg total) by mouth every 6 (six) hours as needed for pain (or cough).  120 tablet  1  . [DISCONTINUED] furosemide (LASIX) 20 MG tablet Take 1 tablet (20 mg total) by mouth daily.  30 tablet  3  . [DISCONTINUED] fluconazole (DIFLUCAN) 200 MG tablet Take 1 tablet (200 mg total) by mouth daily.  2 tablet  1  . [DISCONTINUED] predniSONE (DELTASONE) 20 MG tablet Take 3 tablets (60 mg total) by mouth daily. For COPD flare  30 tablet  0  . [DISCONTINUED] promethazine-phenylephrine (PROMETHAZINE-PHENYLEPHRINE) 6.25-5 MG/5ML SYRP Take 5 mLs by mouth every 4 (four) hours as needed.  240 mL  0

## 2012-09-14 NOTE — Patient Instructions (Signed)
We will refer you to Dr. Aubery Lapping at [facility] for a second opinion on your severe COPD Keep taking your medications as written Start taking doxycycline 100mg  by mouth twice a day for one week a month Use the diflucan as needed for yeast infections with the doxycycline We will see you back in 6 weeks or sooner if needed

## 2012-09-14 NOTE — Assessment & Plan Note (Addendum)
We continue to struggle with Catherine [name]'s COPD.  She has fallen off of the proverbial cliff in the last year, and we have yet to identify the reason why.  She has been admitted 6 times or more for exacerbations of her COPD.  There is no clear infectious or vascular cause of her rapid decline.  I question whether or not there is an allergen in the home contributing, but she has lived there for years and hasn't noticed recent changes.  Her most recent CT chest showed some clustered nodules which can suggest a fungal or mycobacterial infection, but she does not produce sputum so this seems less likely.  I don't think she would tolerate a bronch to evaluate for this.  At this point she is on maximum therapy and continuing to require monthly hospitalizations.  I can't disagree with the continuous prednisone for now, but it clearly has severe consequences considering her obesity and diabetes.  Plan: -RAST testing -continue spiriva, symbicort, O2, albuterol -continue prednisone daily 10mg  for now -continue O2 as written -Refer for a second opinion to Dr. Otto Herb at [facility] -consider RHC to evaluate for pulm hypertension, but wait for Dr. Cecil Cobbs input first -Refer for pulmonary rehabilitation

## 2012-09-15 ENCOUNTER — Other Ambulatory Visit: Payer: Self-pay | Admitting: General Practice

## 2012-09-15 DIAGNOSIS — R911 Solitary pulmonary nodule: Secondary | ICD-10-CM | POA: Insufficient documentation

## 2012-09-15 LAB — BASIC METABOLIC PANEL
Calcium: 8.8 mg/dL (ref 8.4–10.5)
Creatinine, Ser: 0.7 mg/dL (ref 0.4–1.2)
GFR: 89.82 mL/min (ref >60.00)
Sodium: 140 mEq/L (ref 135–145)

## 2012-09-15 LAB — ALLERGY FULL PROFILE
Allergen,Goose feathers, e70: <0.1 kU/L
Alternaria Alternata: <0.1 kU/L
Aspergillus fumigatus, IgG: <0.1 kU/L
Bermuda Grass: 0.15 kU/L — ABNORMAL HIGH
Candida Albicans: <0.1 kU/L
Cat Dander: <0.1 kU/L
Common Ragweed: 0.56 kU/L — ABNORMAL HIGH
D. farinae: 0.44 kU/L — ABNORMAL HIGH
Dog Dander: 1.02 kU/L — ABNORMAL HIGH
Goldenrod: <0.1 kU/L
Helminthosporium halodes: <0.1 kU/L
House Dust Hollister: 0.59 kU/L — ABNORMAL HIGH
IgE (Immunoglobulin E), Serum: 40.1 IU/mL (ref 0.0–180.0)
Lamb's Quarters: <0.1 kU/L
Plantain: <0.1 kU/L
Stemphylium Botryosum: <0.1 kU/L
Timothy Grass: 0.52 kU/L — ABNORMAL HIGH

## 2012-09-15 MED ORDER — "INSULIN SYRINGE 31G X 5/16"" 0.5 ML MISC": Status: DC

## 2012-09-15 NOTE — Telephone Encounter (Signed)
Med resent today.

## 2012-09-15 NOTE — Telephone Encounter (Signed)
Pt is calling and saying that she went to her pharmacy [facility] and they never received the needles ??? She is completely out and is needing those as soon as possible.

## 2012-09-15 NOTE — Assessment & Plan Note (Signed)
I think that these nodules are likely infectious/reactive in origin.  She has since been treated with Abx so we expect that they will be gone on the 3 month follow up CT which I have ordered.  I doubt she would tolerate a bronch for evaluation.

## 2012-09-16 ENCOUNTER — Telehealth: Payer: Self-pay | Admitting: *Deleted

## 2012-09-16 NOTE — Telephone Encounter (Signed)
LMTCB for pt to advise to stay on pred 10 mg until next ov with Dr. Kendrick Fries

## 2012-09-16 NOTE — Telephone Encounter (Signed)
Message copied by Christen Butter on Thu Sep 16, 2012  9:19 AM ------      Message from: Max Fickle B      Created: Wed Sep 15, 2012  6:58 PM       L,            Can you clarify with Ms. Barra that we want her to remain on 10mg  daily of prednisone until we see her next?  I thought that was clear but when I left last night Dr. Darrick Huntsman told me that it was actually a long taper.            Thanks,      B

## 2012-09-17 NOTE — Progress Notes (Signed)
Quick Note:  Called, spoke with pt. Informed her of allergy test results per BQ. She verbalized understanding and voiced no further questions or concerns at this time. ______

## 2012-09-23 NOTE — Telephone Encounter (Signed)
ATC, NA and no option to leave a msg, WCB 

## 2012-09-27 NOTE — Telephone Encounter (Signed)
Closed in error.

## 2012-09-27 NOTE — Telephone Encounter (Signed)
I finally spoke with pt She states that she has already tapered the prednisone down and will start on 3 mg tomorrow She has not had any improvement in her breathing since last visit Do you still want her to start taking 10 mg tomorrow? Please advise thanks!

## 2012-09-28 MED ORDER — PREDNISONE 10 MG PO TABS: 10.0000 mg | ORAL_TABLET | Freq: Every day | ORAL | Status: DC

## 2012-09-28 NOTE — Telephone Encounter (Signed)
Pt aware to start back on 10 mg pred daily I have sent rx to her pharm She will see Dr. Kendrick Fries on 10/26/12 and call sooner if needed

## 2012-09-28 NOTE — Addendum Note (Signed)
Addended by: Christen Butter on: 09/28/2012 09:48 AM   Modules accepted: Orders

## 2012-09-28 NOTE — Telephone Encounter (Signed)
I'd rather have her on 10mg  daily until the next visit

## 2012-09-30 DIAGNOSIS — J9610 Chronic respiratory failure, unspecified whether with hypoxia or hypercapnia: Secondary | ICD-10-CM

## 2012-09-30 DIAGNOSIS — E11311 Type 2 diabetes mellitus with unspecified diabetic retinopathy with macular edema: Secondary | ICD-10-CM

## 2012-09-30 DIAGNOSIS — B373 Candidiasis of vulva and vagina: Secondary | ICD-10-CM

## 2012-09-30 DIAGNOSIS — B3731 Acute candidiasis of vulva and vagina: Secondary | ICD-10-CM

## 2012-09-30 DIAGNOSIS — E1339 Other specified diabetes mellitus with other diabetic ophthalmic complication: Secondary | ICD-10-CM

## 2012-09-30 DIAGNOSIS — E118 Type 2 diabetes mellitus with unspecified complications: Secondary | ICD-10-CM

## 2012-09-30 DIAGNOSIS — R609 Edema, unspecified: Secondary | ICD-10-CM

## 2012-09-30 DIAGNOSIS — E11339 Type 2 diabetes mellitus with moderate nonproliferative diabetic retinopathy without macular edema: Secondary | ICD-10-CM

## 2012-09-30 DIAGNOSIS — Z79899 Other long term (current) drug therapy: Secondary | ICD-10-CM

## 2012-09-30 DIAGNOSIS — J449 Chronic obstructive pulmonary disease, unspecified: Secondary | ICD-10-CM

## 2012-10-07 LAB — HM DIABETES EYE EXAM: HM Diabetic Eye Exam: NORMAL

## 2012-10-26 ENCOUNTER — Encounter: Payer: Self-pay | Admitting: Pulmonary Disease

## 2012-10-26 ENCOUNTER — Ambulatory Visit (INDEPENDENT_AMBULATORY_CARE_PROVIDER_SITE_OTHER): Payer: 59 | Admitting: Pulmonary Disease

## 2012-10-26 VITALS — BP 152/78 | HR 107 | Temp 98.0°F | Ht 63.0 in | Wt 243.0 lb

## 2012-10-26 DIAGNOSIS — R911 Solitary pulmonary nodule: Secondary | ICD-10-CM

## 2012-10-26 DIAGNOSIS — J449 Chronic obstructive pulmonary disease, unspecified: Secondary | ICD-10-CM

## 2012-10-26 NOTE — Assessment & Plan Note (Signed)
This is been a second visit in a row for Catherine [name] where she hasn't had to go to the hospital which is a huge achievement. From July through December 2013 she was admitted on a monthly basis to [facility].  It's unclear to me which of the interventions we made has helped the most. I am suspicious that having the bulldog removed from her home has helped the most.  We really need to try to get her off the prednisone given her weight gain.  Plan: -Continue doxycycline for one week a month for now -Taper prednisone down to 5 mg daily for one week then 5 mg every other day for a week then off -Continue Roflumilast -Continue Spiriva -Continue Symbicort -Continue albuterol as written -Followup with Dr. Lorenso Courier at [facility] as previously scheduled

## 2012-10-26 NOTE — Progress Notes (Signed)
Subjective:    Patient ID: Catherine [name], female    DOB: 1955/09/28, 57 y.o.   MRN: [id number]  Synopsis: Catherine [name] first saw the [facility]monary clinic in June 2013 for GOLD Grade D COPD.  She has frequent exacerbations, poor functional status, and her FEV1 is 0.91 L (41% pred).  She smoked 1 ppd for 40 years and quit in 2012.  HPI  04/19/12 ROV -- since her last visit Catherine [name] was admitted to [facility] for shortness of breath. I do not have record of this yet but apparently she ruled out for an MI and was treated with steroids. Since her hospital discharge she says that she has had no improvement in her shortness of breath. She continues to have cough without sputum production and she notes chest congestion and chest tightness. She has not had leg swelling. She does not have fever or chills but she has noted frequent sweats. She continues to use oxygen at 2 L continuously which was started during this hospitalization and she is still on a prednisone taper (currently taking 20 mg daily).  05/03/12 ROV --Since her last visit Catherine [name] says that she initially improved with the moxifloxacin therapy that we wrote. However she is still having a lot of trouble with cough and her coughing spells are causing significant shortness of breath and wheezing. She states that when she is at rest she does not wheeze or have shortness of breath. However when she starts coughing her shortness of breath becomes quite significant. She has noted more sinus congestion and sneezing which she thinks may be due to allergies. She notes some indigestion at home and takes protonix for acid reflux. She has not had fevers or chills since her hospitalization. She has chest tightness and some body cramps but has not had significant swelling in her feet.  07/05/2012 ROV --Catherine [name] returns to clinic today stating that she feels lousy. She says that for the last week she has had increasing chest congestion without fevers  or chills. She has had sweats though. She states that she's had increasing shortness of breath in the last 2-3 days and almost went to the emergency department last night. She denies significant sinus symptoms but she states that her coughing spells are lengthy. She feels chest congestion but cannot produce sputum. She has not been well enough to get a flu shot yet this year.  09/14/12 ROV -- Catherine [name] looks better to me today than she has on any of her prior visits. Of course, she was actually recently discharged from the hospital for a flare of COPD. She was treated there with Solu-Medrol and Levaquin. She is currently on a long taper of prednisone and is now down to 10 mg daily. She says that right now she feels "great" and she wants to get up and exercise more. She had some leg swelling during the most recent hospitalization and that has improved.  10/26/12 ROV -- Catherine [name] again is feeling okay. She states that she still has dyspnea with shortness of breath and notes that she has gained 25 pounds since being on prednisone in the last several months. She has shortness of breath with activities like going to the bathroom. However in general her shortness of breath has been better for the last 2 months. She is improved cough and she continues to have no sputum production. She continues to use her oxygen. She tells me that she got rid of the bulldog around Christmas and she has not  been hospitalized since then.    Past Medical History  Diagnosis Date  . Diabetes mellitus   . Leukocytosis   . Hyperlipidemia   . COPD (chronic obstructive pulmonary disease)   . Hyperglycemia   . Hypertension   . S/P cardiac catheterization March 2012    normal coronaries,  due to chest pain ([place])  . Tobacco abuse, in remission     quit oct during hospitalization   . Screening for colon cancer June 2011    polyps found, next one due 2014 ([place])  . Gastritis and duodenitis June 2011    EGD  . Screening for breast  cancer Jul 28 2011    normal  . GERD (gastroesophageal reflux disease)   . Obesity (BMI 30-39.9)   . Depression   . Anxiety     Review of Systems  Constitutional: Negative for fever, chills and unexpected weight change.  HENT: Negative for nosebleeds, congestion, rhinorrhea, sneezing and postnasal drip.   Respiratory: Positive for shortness of breath. Negative for cough and choking.   Cardiovascular: Negative for chest pain and leg swelling.       Objective:   Physical Exam   Filed Vitals:   10/26/12 1406  BP: 152/78  Pulse: 107  Temp: 98 F (36.7 C)  TempSrc: Oral  Height: 5\' 3"  (1.6 m)  Weight: 243 lb (110.224 kg)  SpO2: 94%   Gen: obese, no acute distress HEENT: NCAT, EOMi, OP clear,  PULM: CTA B, no wheezing CV: RRR, no mgr, no JVD AB: BS+, soft, nontender, no hsm Ext: warm, trace left leg edema, no clubbing, no cyanosis   09/2011 and 12/2011 CXR from [facility] reviewed: no clear parenchymal abnormality, some vascular congestion  04/2012 pulmonary function test ratio 50%, FEV1 0.92 L (41%), TLC 3.39 L (72% predicted) ERV (15% predicted), DLCO 51% predicted  04/2012 CT Chest: no PE, no infiltrate, no evidence of fibrosis  08/2013 CT Chest: No PE, bilateral centrilobular emphysema; few areas of clustered sub centimeter nodules  January 2014 RAST testing> positive for various grasses, ragweed and house dust; most strongly positive for dog dander  January 2014> IgE 40 (within normal limits)     Assessment & Plan:   COPD (chronic obstructive pulmonary disease) This is been a second visit in a row for Catherine [name] where she hasn't had to go to the hospital which is a huge achievement. From July through December 2013 she was admitted on a monthly basis to [facility].  It's unclear to me which of the interventions we made has helped the most. I am suspicious that having the bulldog removed from her home has helped the most.  We really need to try to get her  off the prednisone given her weight gain.  Plan: -Continue doxycycline for one week a month for now -Taper prednisone down to 5 mg daily for one week then 5 mg every other day for a week then off -Continue Roflumilast -Continue Spiriva -Continue Symbicort -Continue albuterol as written -Followup with Dr. Lorenso Courier at [facility] as previously scheduled    Updated Medication List Outpatient Encounter Prescriptions as of 10/26/2012  Medication Sig Dispense Refill  . albuterol (PROVENTIL HFA) 108 (90 BASE) MCG/ACT inhaler Inhale 2 puffs into the lungs every 6 (six) hours as needed for wheezing.  2 Inhaler  3  . ALPRAZolam (XANAX) 0.25 MG tablet Take 1 tablet (0.25 mg total) by mouth at bedtime as needed.  30 tablet  2  .  aspirin 81 MG EC tablet Take 81 mg by mouth daily.        Marland Kitchen atorvastatin (LIPITOR) 80 MG tablet Take 1 tablet (80 mg total) by mouth daily.  30 tablet  11  . budesonide-formoterol (SYMBICORT) 160-4.5 MCG/ACT inhaler Inhale 2 puffs into the lungs 2 (two) times daily.  10.2 g  6  . chlorpheniramine-HYDROcodone (TUSSIONEX) 10-8 MG/5ML LQCR Take 5 mLs by mouth every 8 (eight) hours as needed.  240 mL  3  . docusate sodium (COLACE) 100 MG capsule Take 200 mg by mouth daily.      . furosemide (LASIX) 20 MG tablet Take 20 mg by mouth daily as needed.      Marland Kitchen glipiZIDE (GLUCOTROL) 5 MG tablet Take 1 tablet (5 mg total) by mouth 2 (two) times daily.  60 tablet  11  . glucose blood (PRECISION QID TEST) test strip Use as instructed  100 each  12  . insulin aspart (NOVOLOG) 100 UNIT/ML injection Use per home sliding scale while on prednisone  10 mL  3  . insulin glargine (LANTUS SOLOSTAR) 100 UNIT/ML injection Inject 25 Units into the skin at bedtime.  5 pen  PRN  . Insulin Syringe-Needle U-100 (INSULIN SYRINGE .5CC/31GX5/16") 31G X 5/16" 0.5 ML MISC Test twice a day  100 each  11  . pantoprazole (PROTONIX) 40 MG tablet Take 1 tablet (40 mg total) by mouth 2 (two) times  daily.  60 tablet  2  . PARoxetine (PAXIL) 10 MG tablet Take 1 tablet (10 mg total) by mouth every morning.  30 tablet  5  . Potassium Gluconate 595 MG CAPS Take 2 capsules by mouth daily.       . predniSONE (DELTASONE) 10 MG tablet 60 mg daily for one week,  Reduce by 10  Mg weekly  126 tablet  0  . predniSONE (DELTASONE) 10 MG tablet Take 1 tablet (10 mg total) by mouth daily.  30 tablet  0  . roflumilast (DALIRESP) 500 MCG TABS tablet Take 1 tablet (500 mcg total) by mouth every other day.  15 tablet  5  . tiotropium (SPIRIVA) 18 MCG inhalation capsule Place 1 capsule (18 mcg total) into inhaler and inhale daily.  30 capsule  6  . traMADol (ULTRAM) 50 MG tablet Take 1 tablet (50 mg total) by mouth every 6 (six) hours as needed for pain (or cough).  120 tablet  1   No facility-administered encounter medications on file as of 10/26/2012.

## 2012-10-26 NOTE — Patient Instructions (Signed)
Start taking prednisone at 5mg  daily for one week Then decrease the prednisone to 5mg  every other day for one week, then stop If you feel dizzy or weak after stopping the prednisone, continue taking it at 5mg  every other day and let us know Keep taking the roflumilast, symbicort, and tiotropium as you are doing Keep taking the doxycycline one week a month as you are doing Keep you appointment at [facility] on 11/10/12 We will make sure that you get a CT scan of your chest for the nodules at [facility] We will see you back in 6-8 weeks

## 2012-11-04 ENCOUNTER — Other Ambulatory Visit: Payer: Self-pay | Admitting: Internal Medicine

## 2012-11-05 NOTE — Telephone Encounter (Signed)
Med filled.  

## 2012-11-11 ENCOUNTER — Telehealth: Payer: Self-pay | Admitting: Internal Medicine

## 2012-11-11 DIAGNOSIS — N6311 Unspecified lump in the right breast, upper outer quadrant: Secondary | ICD-10-CM

## 2012-11-11 DIAGNOSIS — Z1239 Encounter for other screening for malignant neoplasm of breast: Secondary | ICD-10-CM

## 2012-11-11 NOTE — Telephone Encounter (Signed)
Do you need to see her first?

## 2012-11-11 NOTE — Telephone Encounter (Signed)
Diagnostic right and screening left mammograms ordered.   please call [facility] to issue a dc order for neb and 02 concentrator

## 2012-11-11 NOTE — Telephone Encounter (Signed)
Needing a mammogram scheduled found a lump in her right breast 11:00 between nipple and top of breast.  Please discontinue the nebulizer and oxygen concentrator from [place].

## 2012-11-15 NOTE — Telephone Encounter (Signed)
Pt notified the mammogram was ordered and someone from our office would call to schedule this. D/C letter typed and faxed to [employer].

## 2012-11-16 ENCOUNTER — Encounter: Payer: Self-pay | Admitting: Pulmonary Disease

## 2012-11-23 ENCOUNTER — Telehealth: Payer: Self-pay | Admitting: *Deleted

## 2012-11-23 ENCOUNTER — Other Ambulatory Visit: Payer: Self-pay | Admitting: Internal Medicine

## 2012-11-23 MED ORDER — IPRATROPIUM-ALBUTEROL 0.5-2.5 (3) MG/3ML IN SOLN: 3.0000 mL | Freq: Four times a day (QID) | RESPIRATORY_TRACT | Status: DC | PRN

## 2012-11-23 NOTE — Telephone Encounter (Signed)
I do not see her being on this previously. Ok to fill?

## 2012-11-23 NOTE — Telephone Encounter (Signed)
Refill Request  Duoneb inhalation solution  #360  Inhale contents of 1 vial via nebulizer every 4 hours as needed

## 2012-11-23 NOTE — Telephone Encounter (Signed)
Ok to refill,  Authorized in epic 

## 2012-11-23 NOTE — Telephone Encounter (Signed)
Noted  

## 2012-11-25 ENCOUNTER — Ambulatory Visit: Payer: Self-pay | Admitting: Internal Medicine

## 2012-11-26 ENCOUNTER — Telehealth: Payer: Self-pay | Admitting: Internal Medicine

## 2012-11-26 ENCOUNTER — Emergency Department: Payer: Self-pay | Admitting: Emergency Medicine

## 2012-11-26 LAB — CBC
HGB: 13.8 g/dL (ref 12.0–16.0)
MCV: 87 fL (ref 80–100)
Platelet: 306 10*3/uL (ref 150–440)
RBC: 4.65 10*6/uL (ref 3.80–5.20)
WBC: 10.5 10*3/uL (ref 3.6–11.0)

## 2012-11-26 NOTE — Telephone Encounter (Signed)
Pt.notified

## 2012-11-26 NOTE — Telephone Encounter (Signed)
Diagnostic mammogram and ultrasound of right breast was negative. If she still feels a lump please have her make an appointment for me to examine her so we can decide whether she needs to see a Careers adviser.

## 2012-11-26 NOTE — Telephone Encounter (Signed)
Patient Information:  Caller Name: Meagan  Phone: [phone]  Patient: Catherine [name], Catherine [name]  Gender: Female  DOB: 1955/10/04  Age: 57 Years  PCP: Duncan Dull (Adults only)  Office Follow Up:  Does the office need to follow up with this patient?: Yes  Instructions For The Office: Follow up on 11/29/12 regarding swelling of left foot, ankle and both hands. Was advised UC/ED on 3/21.  Stated she would go to UC on 3/22.   Symptoms  Reason For Call & Symptoms: Ceri states she has had swelling in left leg, left ankle , left foot and both hands and fingers since December 2013. Was ordered Lasix in December. Had exacerbation of COPD in January and was ordered a long taper of Prednisone. Was told by pulmonology on 2/18 that "swelling was due to Prednisone."  States she has finished Prednisone and swelling " Is worse than ever." No increased shortness of breath. States foot was double the normal size on 11/25/12. States elevating leg helps but does not reduce swelling completely.States she cannot feel pulse in that feet but feet pink and warm to touch.  "Hurts to raise left foot when swollen double the normal size." Per swelling to leg protocol has go to office now protocol due to swelling of hands. Office closed. Advised UC/ED. Declined appt at [facility] on 3/22. States she will go to UC on 11/27/12. Care advice given.  Reviewed Health History In EMR: Yes  Reviewed Medications In EMR: Yes  Reviewed Allergies In EMR: Yes  Reviewed Surgeries / Procedures: Yes  Date of Onset of Symptoms: 08/18/2012  Treatments Tried: Lasix, elevating feet  Treatments Tried Worked: No  Guideline(s) Used:  Leg Swelling and Edema  Disposition Per Guideline:   Go to Office Now  Reason For Disposition Reached:   Swelling of face, arm or hands (Exception: slight puffiness of fingers during hot weather)  Advice Given:  N/A  Patient Refused Recommendation:  Patient Will Go To [facility]  Will go to UC on  11/27/12.

## 2012-11-27 LAB — CK TOTAL AND CKMB (NOT AT ARMC)
CK, Total: 758 U/L — ABNORMAL HIGH
CK-MB: 6.4 ng/mL — ABNORMAL HIGH

## 2012-11-27 LAB — COMPREHENSIVE METABOLIC PANEL
Albumin: 3.3 g/dL — ABNORMAL LOW (ref 3.4–5.0)
Anion Gap: 9 (ref 7–16)
BUN: 12 mg/dL (ref 7–18)
Calcium, Total: 8.1 mg/dL — ABNORMAL LOW (ref 8.5–10.1)
Co2: 27 mmol/L (ref 21–32)
EGFR (Non-African Amer.): >60
Glucose: 234 mg/dL — ABNORMAL HIGH (ref 65–99)
Osmolality: 290 (ref 275–301)
Potassium: 3.2 mmol/L — ABNORMAL LOW (ref 3.5–5.1)
SGOT(AST): 42 U/L — ABNORMAL HIGH (ref 15–37)
SGPT (ALT): 32 U/L (ref 12–78)
Total Protein: 7.5 g/dL (ref 6.4–8.2)

## 2012-11-27 LAB — TROPONIN I: Troponin-I: <0.02 ng/mL

## 2012-11-27 LAB — PRO B NATRIURETIC PEPTIDE: B-Type Natriuretic Peptide: 23 pg/mL (ref 0–125)

## 2012-12-01 ENCOUNTER — Telehealth: Payer: Self-pay | Admitting: Internal Medicine

## 2012-12-01 ENCOUNTER — Inpatient Hospital Stay: Payer: Self-pay | Admitting: Family Medicine

## 2012-12-01 LAB — BASIC METABOLIC PANEL
Anion Gap: 5 — ABNORMAL LOW (ref 7–16)
BUN: 8 mg/dL (ref 7–18)
Chloride: 106 mmol/L (ref 98–107)
Co2: 29 mmol/L (ref 21–32)
EGFR (Non-African Amer.): >60
Osmolality: 281 (ref 275–301)
Potassium: 3.3 mmol/L — ABNORMAL LOW (ref 3.5–5.1)
Sodium: 140 mmol/L (ref 136–145)

## 2012-12-01 LAB — CBC
HCT: 39.9 % (ref 35.0–47.0)
MCH: 28.9 pg (ref 26.0–34.0)
MCHC: 33.5 g/dL (ref 32.0–36.0)
MCV: 86 fL (ref 80–100)
RDW: 13.6 % (ref 11.5–14.5)
WBC: 11.2 10*3/uL — ABNORMAL HIGH (ref 3.6–11.0)

## 2012-12-01 LAB — TROPONIN I: Troponin-I: <0.02 ng/mL

## 2012-12-01 NOTE — Telephone Encounter (Signed)
Patient Information:  Caller Name: Sadia  Phone: [phone]  Patient: Catherine [name], Catherine [name]  Gender: Female  DOB: 1955-12-03  Age: 57 Years  PCP: Duncan Dull (Adults only)  Office Follow Up:  Does the office need to follow up with this patient?: No  Instructions For The Office: N/A  RN Note:  Ongoing edema since December. COPD. Was on long Prednisone taper. Reports  becomes SOB when ambulates and has chronic cough. REports "sometimes" hard to catch her breath; difficulty breathing even after using Albuterol every 4 hours; now needs inhaler in between neb treatments for past 3 days. FBS 147.  Facial edema present.  Gaining weight without eating.  Gained 4 lbs 11/02/12; today Wt # 245.  Taking 20 mg Lasix every AM.  O2 sats drop to 83% when walks to bathroom; at times it is in the 80's with O2.  No appointments remain in office; Eunice Blase is at least 45 minutes from office.  Advised to go to [facility] ED now.   Symptoms  Reason For Call & Symptoms: Ongoing edema of both feet but maily the L foot, leg and both hands.  Seen in ED 11/27/12.  DVT was ruled out with sonogram.  Reviewed Health History In EMR: Yes  Reviewed Medications In EMR: Yes  Reviewed Allergies In EMR: Yes  Reviewed Surgeries / Procedures: Yes  Date of Onset of Symptoms: 08/18/2012  Treatments Tried: Lasix, elevate legs  Treatments Tried Worked: No  Guideline(s) Used:  Leg Swelling and Edema  Disposition Per Guideline:   Go to ED Now  Reason For Disposition Reached:   Difficulty breathing at rest  Advice Given:  Call Back If:  Swelling becomes worse  Swelling becomes red or painful to the touch  Calf pain occurs and becomes constant  You become worse.  Patient Will Follow Care Advice:  YES

## 2012-12-02 DIAGNOSIS — J984 Other disorders of lung: Secondary | ICD-10-CM

## 2012-12-03 LAB — BASIC METABOLIC PANEL
Chloride: 99 mmol/L (ref 98–107)
Co2: 26 mmol/L (ref 21–32)
Creatinine: 1 mg/dL (ref 0.60–1.30)
Osmolality: 285 (ref 275–301)
Potassium: 3.5 mmol/L (ref 3.5–5.1)
Sodium: 135 mmol/L — ABNORMAL LOW (ref 136–145)

## 2012-12-04 LAB — BASIC METABOLIC PANEL
BUN: 26 mg/dL — ABNORMAL HIGH (ref 7–18)
Calcium, Total: 8.7 mg/dL (ref 8.5–10.1)
Co2: 30 mmol/L (ref 21–32)
EGFR (African American): >60
Glucose: 330 mg/dL — ABNORMAL HIGH (ref 65–99)
Osmolality: 286 (ref 275–301)
Potassium: 3.8 mmol/L (ref 3.5–5.1)
Sodium: 134 mmol/L — ABNORMAL LOW (ref 136–145)

## 2012-12-05 LAB — CBC WITH DIFFERENTIAL/PLATELET
Basophil #: 0 10*3/uL (ref 0.0–0.1)
Basophil %: 0.1 %
Eosinophil #: 0 10*3/uL (ref 0.0–0.7)
HCT: 37.7 % (ref 35.0–47.0)
HGB: 12.7 g/dL (ref 12.0–16.0)
Lymphocyte #: 0.9 10*3/uL — ABNORMAL LOW (ref 1.0–3.6)
Lymphocyte %: 6.5 %
MCH: 29.3 pg (ref 26.0–34.0)
MCHC: 33.7 g/dL (ref 32.0–36.0)
MCV: 87 fL (ref 80–100)
Monocyte #: 0.4 x10 3/mm (ref 0.2–0.9)
Monocyte %: 2.7 %
Neutrophil #: 12.8 10*3/uL — ABNORMAL HIGH (ref 1.4–6.5)
Neutrophil %: 90.7 %
Platelet: 331 10*3/uL (ref 150–440)
WBC: 14.1 10*3/uL — ABNORMAL HIGH (ref 3.6–11.0)

## 2012-12-06 NOTE — Telephone Encounter (Signed)
Pt never responded to messages.

## 2012-12-07 LAB — BASIC METABOLIC PANEL
Anion Gap: 7 (ref 7–16)
BUN: 19 mg/dL — ABNORMAL HIGH (ref 7–18)
Calcium, Total: 8.1 mg/dL — ABNORMAL LOW (ref 8.5–10.1)
Chloride: 100 mmol/L (ref 98–107)
Co2: 30 mmol/L (ref 21–32)
Creatinine: 0.68 mg/dL (ref 0.60–1.30)
EGFR (African American): >60
EGFR (Non-African Amer.): >60
Glucose: 224 mg/dL — ABNORMAL HIGH (ref 65–99)
Osmolality: 283 (ref 275–301)
Potassium: 3.8 mmol/L (ref 3.5–5.1)

## 2012-12-13 NOTE — Telephone Encounter (Signed)
Pt called after [facility] discharge. Finally got ahold of her today. States she is feeling better. Has a follow up appt on 4/14.

## 2012-12-14 ENCOUNTER — Other Ambulatory Visit: Payer: Self-pay | Admitting: Internal Medicine

## 2012-12-14 NOTE — Telephone Encounter (Signed)
Med filled.  

## 2012-12-16 ENCOUNTER — Encounter: Payer: Self-pay | Admitting: Internal Medicine

## 2012-12-20 ENCOUNTER — Encounter: Payer: Self-pay | Admitting: Internal Medicine

## 2012-12-20 ENCOUNTER — Ambulatory Visit (INDEPENDENT_AMBULATORY_CARE_PROVIDER_SITE_OTHER): Payer: 59 | Admitting: Internal Medicine

## 2012-12-20 VITALS — BP 138/76 | HR 98 | Temp 98.0°F | Resp 18 | Wt 241.0 lb

## 2012-12-20 DIAGNOSIS — J441 Chronic obstructive pulmonary disease with (acute) exacerbation: Secondary | ICD-10-CM

## 2012-12-20 DIAGNOSIS — E118 Type 2 diabetes mellitus with unspecified complications: Secondary | ICD-10-CM

## 2012-12-20 DIAGNOSIS — E1059 Type 1 diabetes mellitus with other circulatory complications: Secondary | ICD-10-CM

## 2012-12-20 DIAGNOSIS — B373 Candidiasis of vulva and vagina: Secondary | ICD-10-CM

## 2012-12-20 DIAGNOSIS — B3731 Acute candidiasis of vulva and vagina: Secondary | ICD-10-CM

## 2012-12-20 MED ORDER — HYDROMORPHONE HCL 2 MG PO TABS: 2.0000 mg | ORAL_TABLET | Freq: Every evening | ORAL | Status: DC | PRN

## 2012-12-20 NOTE — Assessment & Plan Note (Signed)
She will return for fasting blood work is in his continued. Her last A1c was 7.3 in November. She continues to use Lantus glipizide statin aspirin and a sliding scale insulin with NovoLog.

## 2012-12-20 NOTE — Progress Notes (Signed)
Patient ID: Catherine [name], female   DOB: [birth year], 57 y.o.   MRN: [id number]   Patient Active Problem List  Diagnosis  . CAD (coronary artery disease)  . HTN (hypertension)  . Hyperlipidemia  . Tobacco abuse, in remission  . Screening for colon cancer  . Gastritis and duodenitis  . Screening for breast cancer  . GERD (gastroesophageal reflux disease)  . Obesity (BMI 30-39.9)  . Depression  . Anxiety  . Diabetes mellitus with complication  . COPD (chronic obstructive pulmonary disease)  . Cough  . Chronic hypoxemic respiratory failure  . COPD exacerbation  . Edema leg  . Vaginitis due to Candida  . Pulmonary nodule    Subjective:  CC:   Chief Complaint  Patient presents with  . Follow-up    from hospital    HPI:   Catherine [name] a 57 y.o. female who presents ospital follow up    patient was from March 26 April 3 to [facility] for COPD exacerbation. She had been treated previously in the ER with lasix for 8 pound weight gain due to lower extremity edema  but developed persistent dyspnea without chest pain despite losing 5 pounds in water weight. Due to increased use of albuterol nebulizers and persistent dyspneic symptoms she was admitted. She was treated with high-dose steroids for several days and Rocephin and azithromycin. Chest x-ray was negative for pneumonia. Echocardiogram was done which revealed normal LV function and impaired diastolic function. Patient did not have pulmonary edema and was not treated for CHF exacerbation. She was discharged home on April 3 with a prednisone taper. She was apparently also treated with clotted for multiple joint pain issues which have been mostly resolved except for her right shoulder which keeps her awake at night due to pain and popping. She does not want to see an orthopedist. She feels that her breathing is vastly improved compared to prior hospitalizations because she was kept on high-dose steroids for so long. She is now  currently asymptomatic and is not using supplemental oxygen. She's only had to use the supplemental oxygen once or twice since discharge. She is requesting use of that wanted at night for shoulder pain. She has multiple allergies to other all other narcotics. She finished her prednisone yesterday. Her sugars remain elevated due to recent prednisone use and she isn't using sliding scale NovoLog for sugars above 200.    Past Medical History  Diagnosis Date  . Diabetes mellitus   . Leukocytosis   . Hyperlipidemia   . COPD (chronic obstructive pulmonary disease)   . Hyperglycemia   . Hypertension   . S/P cardiac catheterization March 2012    normal coronaries,  due to chest pain ([place])  . Tobacco abuse, in remission     quit oct during hospitalization   . Screening for colon cancer June 2011    polyps found, next one due 2014 ([place])  . Gastritis and duodenitis June 2011    EGD  . Screening for breast cancer Jul 28 2011    normal  . GERD (gastroesophageal reflux disease)   . Obesity (BMI 30-39.9)   . Depression   . Anxiety     Past Surgical History  Procedure Laterality Date  . Cardiac catheterization  11/22/2010    no significant disease  . Abdominal hysterectomy    . Hemorrhoid surgery    . Abdominal hysterectomy    . Bilateral oophorectomy  1995    and TAH.  noncancerous reasons  The following portions of the patient's history were reviewed and updated as appropriate: Allergies, current medications, and problem list.    Review of Systems:   12 Pt  review of systems was negative except those addressed in the HPI,     History   Social History  . Marital Status: Married    Spouse Name: N/A    Number of Children: 4  . Years of Education: N/A   Occupational History  . Secretary    Social History Main Topics  . Smoking status: Former Smoker -- 1.00 packs/day for 40 years    Types: Cigarettes    Quit date: 06/27/2011  . Smokeless tobacco: Never Used   . Alcohol Use: No  . Drug Use: No  . Sexually Active: Not on file   Other Topics Concern  . Not on file   Social History Narrative  . No narrative on file    Objective:  BP 138/76  Pulse 98  Temp(Src) 98 F (36.7 C) (Oral)  Resp 18  Wt 241 lb (109.317 kg)  BMI 42.7 kg/m2  SpO2 97%  General appearance: alert, cooperative and appears stated age Ears: normal TM's and external ear canals both ears Throat: lips, mucosa, and tongue normal; teeth and gums normal Neck: no adenopathy, no carotid bruit, supple, symmetrical, trachea midline and thyroid not enlarged, symmetric, no tenderness/mass/nodules Back: symmetric, no curvature. ROM normal. No CVA tenderness. Lungs: clear to auscultation bilaterally Heart: regular rate and rhythm, S1, S2 normal, no murmur, click, rub or gallop Abdomen: soft, non-tender; bowel sounds normal; no masses,  no organomegaly Pulses: 2+ and symmetric Skin: Skin color, texture, turgor normal. No rashes or lesions Lymph nodes: Cervical, supraclavicular, and axillary nodes normal.  Assessment and Plan:  COPD exacerbation Resulting in her first hospitalization in 2014. Last hospitalization was December 2013. Patient was kept in house for a week with prolonged IV steroid therapy to ensure sustained improvement. Chest x-ray was normal. She was treated with Rocephin and azithromycin in-house and discharged home on a slightly prolonged  and Augmentin. Despite finishing the taper yesterday, She is quite comfortable today and satting 97% on room air. Lung exam is clear. Continue Symbicort, Duler  and  all other inhaled bronchodilators per Dr. Kendrick Fries.   Diabetes mellitus with complication She will return for fasting blood work is in his continued. Her last A1c was 7.3 in November. She continues to use Lantus glipizide statin aspirin and a sliding scale insulin with NovoLog.  Vaginitis due to Candida She is currently taking Diflucan for vaginitis secondary to  anabolic and prednisone use.    Updated Medication List Outpatient Encounter Prescriptions as of 12/20/2012  Medication Sig Dispense Refill  . ACCU-CHEK SMARTVIEW test strip TEST BLOOD SUGAR FOUR TIMES DAILY  100 each  12  . albuterol (PROVENTIL HFA) 108 (90 BASE) MCG/ACT inhaler Inhale 2 puffs into the lungs every 6 (six) hours as needed for wheezing.  2 Inhaler  3  . ALPRAZolam (XANAX) 0.25 MG tablet Take 1 tablet (0.25 mg total) by mouth at bedtime as needed.  30 tablet  2  . aspirin 81 MG EC tablet Take 81 mg by mouth daily.        Marland Kitchen atorvastatin (LIPITOR) 80 MG tablet TAKE 1 TABLET BY MOUTH EVERY DAY  30 tablet  2  . budesonide-formoterol (SYMBICORT) 160-4.5 MCG/ACT inhaler Inhale 2 puffs into the lungs 2 (two) times daily.  10.2 g  6  . chlorpheniramine-HYDROcodone (TUSSIONEX) 10-8 MG/5ML LQCR Take 5  mLs by mouth every 8 (eight) hours as needed.  240 mL  3  . glipiZIDE (GLUCOTROL) 5 MG tablet Take 1 tablet (5 mg total) by mouth 2 (two) times daily.  60 tablet  11  . insulin glargine (LANTUS SOLOSTAR) 100 UNIT/ML injection Inject 25 Units into the skin at bedtime.  5 pen  PRN  . Insulin Syringe-Needle U-100 (INSULIN SYRINGE .5CC/31GX5/16") 31G X 5/16" 0.5 ML MISC Test twice a day  100 each  11  . ipratropium-albuterol (DUONEB) 0.5-2.5 (3) MG/3ML SOLN Take 3 mLs by nebulization every 6 (six) hours as needed.  360 mL  11  . NOVOLOG FLEXPEN 100 UNIT/ML injection INJECT 8 UNITS SUBCUTANEOUS THREE TIMES DAILY BEFORE MEALS  15 mL  11  . pantoprazole (PROTONIX) 40 MG tablet TAKE 1 TABLET BY MOUTH TWICE DAILY  60 tablet  2  . PARoxetine (PAXIL) 10 MG tablet Take 1 tablet (10 mg total) by mouth every morning.  30 tablet  5  . roflumilast (DALIRESP) 500 MCG TABS tablet Take 1 tablet (500 mcg total) by mouth every other day.  15 tablet  5  . tiotropium (SPIRIVA) 18 MCG inhalation capsule Place 1 capsule (18 mcg total) into inhaler and inhale daily.  30 capsule  6  . docusate sodium (COLACE) 100 MG  capsule Take 200 mg by mouth daily.      . furosemide (LASIX) 20 MG tablet Take 20 mg by mouth daily as needed.      Marland Kitchen HYDROmorphone (DILAUDID) 2 MG tablet Take 1 tablet (2 mg total) by mouth at bedtime as needed for pain.  30 tablet  0  . Potassium Gluconate 595 MG CAPS Take 2 capsules by mouth daily.       . predniSONE (DELTASONE) 10 MG tablet 60 mg daily for one week,  Reduce by 10  Mg weekly  126 tablet  0  . predniSONE (DELTASONE) 10 MG tablet Take 1 tablet (10 mg total) by mouth daily.  30 tablet  0  . traMADol (ULTRAM) 50 MG tablet Take 1 tablet (50 mg total) by mouth every 6 (six) hours as needed for pain (or cough).  120 tablet  1   No facility-administered encounter medications on file as of 12/20/2012.     Orders Placed This Encounter  Procedures  . Lipid panel  . Hemoglobin A1c  . Comprehensive metabolic panel  . Microalbumin / creatinine urine ratio    No Follow-up on file.

## 2012-12-20 NOTE — Assessment & Plan Note (Signed)
Resulting in her first hospitalization in 2014. Last hospitalization was December 2013. Patient was kept in house for a week with prolonged IV steroid therapy to ensure sustained improvement. Chest x-ray was normal. She was treated with Rocephin and azithromycin in-house and discharged home on a slightly prolonged  and Augmentin. Despite finishing the taper yesterday, She is quite comfortable today and satting 97% on room air. Lung exam is clear. Continue Symbicort, Duler  and  all other inhaled bronchodilators per Dr. Kendrick Fries.

## 2012-12-20 NOTE — Patient Instructions (Addendum)
Please use the dilaudid sparingly,  It is very strong   Try using Simply Saline to flush sinuses daily during the pollen season

## 2012-12-20 NOTE — Assessment & Plan Note (Signed)
She is currently taking Diflucan for vaginitis secondary to anabolic and prednisone use.

## 2012-12-22 ENCOUNTER — Other Ambulatory Visit (INDEPENDENT_AMBULATORY_CARE_PROVIDER_SITE_OTHER): Payer: 59

## 2012-12-22 DIAGNOSIS — E1059 Type 1 diabetes mellitus with other circulatory complications: Secondary | ICD-10-CM

## 2012-12-22 LAB — COMPREHENSIVE METABOLIC PANEL
ALT: 26 U/L (ref 0–35)
CO2: 29 mEq/L (ref 19–32)
Calcium: 8.7 mg/dL (ref 8.4–10.5)
Chloride: 103 mEq/L (ref 96–112)
Creatinine, Ser: 0.8 mg/dL (ref 0.4–1.2)
GFR: 80.51 mL/min (ref >60.00)
Glucose, Bld: 98 mg/dL (ref 70–99)
Total Bilirubin: 0.7 mg/dL (ref 0.3–1.2)
Total Protein: 6.4 g/dL (ref 6.0–8.3)

## 2012-12-22 LAB — MICROALBUMIN / CREATININE URINE RATIO
Microalb Creat Ratio: 0.6 mg/g (ref 0.0–30.0)
Microalb, Ur: 0.7 mg/dL (ref 0.0–1.9)

## 2012-12-22 LAB — LDL CHOLESTEROL, DIRECT: Direct LDL: 105.5 mg/dL

## 2012-12-22 LAB — LIPID PANEL
HDL: 41.7 mg/dL (ref >39.00)
Triglycerides: 202 mg/dL — ABNORMAL HIGH (ref 0.0–149.0)

## 2012-12-22 LAB — HEMOGLOBIN A1C: Hgb A1c MFr Bld: 8.2 % — ABNORMAL HIGH (ref 4.6–6.5)

## 2013-01-03 ENCOUNTER — Telehealth: Payer: Self-pay | Admitting: Internal Medicine

## 2013-01-03 ENCOUNTER — Emergency Department: Payer: Self-pay | Admitting: Emergency Medicine

## 2013-01-03 LAB — BASIC METABOLIC PANEL
Anion Gap: 8 (ref 7–16)
BUN: 8 mg/dL (ref 7–18)
Calcium, Total: 8.3 mg/dL — ABNORMAL LOW (ref 8.5–10.1)
Chloride: 107 mmol/L (ref 98–107)
Co2: 28 mmol/L (ref 21–32)
Glucose: 161 mg/dL — ABNORMAL HIGH (ref 65–99)

## 2013-01-03 LAB — TROPONIN I: Troponin-I: <0.02 ng/mL

## 2013-01-03 LAB — CBC
HGB: 12.6 g/dL (ref 12.0–16.0)
MCH: 29.3 pg (ref 26.0–34.0)
MCHC: 34.3 g/dL (ref 32.0–36.0)
MCV: 86 fL (ref 80–100)
RBC: 4.32 10*6/uL (ref 3.80–5.20)
RDW: 13.9 % (ref 11.5–14.5)

## 2013-01-03 NOTE — Telephone Encounter (Signed)
Patient Information:  Caller Name: Electra  Phone: [phone]  Patient: Catherine [name], Catherine [name]  Gender: Female  DOB: Jan 15, 1956  Age: 57 Years  PCP: Duncan Dull (Adults only)  Office Follow Up:  Does the office need to follow up with this patient?: No  Instructions For The Office: N/A   Symptoms  Reason For Call & Symptoms: Pt is calling with a cough. Pt has a hx of COPD. She has been sick all last week with a cough/headaches and a stomach bug. On Sat 01/01/13) her feet started swelling as well as her face. Pt has home 02/her 02 sats drop into the 80's if she gets up and moves around. Pt is calling for an appt today and was put through to the nurse to evaluate since Dr. Ceasar Lund appt are full  Reviewed Health History In EMR: Yes  Reviewed Medications In EMR: Yes  Reviewed Allergies In EMR: Yes  Reviewed Surgeries / Procedures: Yes  Date of Onset of Symptoms: 12/27/2012  Treatments Tried: Lasix and staying off her feet.  Treatments Tried Worked: No  Guideline(s) Used:  Leg Swelling and Edema  Disposition Per Guideline:   Go to ED Now (or to Office with PCP Approval)/RN sent to ED since no appts in the office today.  Reason For Disposition Reached:   Thigh, calf, or ankle swelling in both legs, but one side is definitely more swollen  Advice Given:  N/A  Patient will go to ED. Last time she was like this she tells RN, the hospital kept her for 1 week.

## 2013-01-03 NOTE — Telephone Encounter (Signed)
Pt states both legs and feet are swollen, pitting edema, low sats.  No openings in clinic this afternoon; transferred to triage line.

## 2013-01-05 ENCOUNTER — Ambulatory Visit: Payer: 59 | Admitting: Cardiovascular Disease

## 2013-01-11 ENCOUNTER — Encounter: Payer: Self-pay | Admitting: Pulmonary Disease

## 2013-01-11 ENCOUNTER — Telehealth: Payer: Self-pay | Admitting: Internal Medicine

## 2013-01-11 ENCOUNTER — Ambulatory Visit (INDEPENDENT_AMBULATORY_CARE_PROVIDER_SITE_OTHER): Payer: 59 | Admitting: Pulmonary Disease

## 2013-01-11 ENCOUNTER — Telehealth: Payer: Self-pay | Admitting: Pulmonary Disease

## 2013-01-11 VITALS — BP 128/80 | HR 90 | Temp 97.8°F | Ht 63.0 in | Wt 244.0 lb

## 2013-01-11 DIAGNOSIS — J449 Chronic obstructive pulmonary disease, unspecified: Secondary | ICD-10-CM

## 2013-01-11 DIAGNOSIS — R609 Edema, unspecified: Secondary | ICD-10-CM

## 2013-01-11 DIAGNOSIS — R400 Somnolence: Secondary | ICD-10-CM

## 2013-01-11 DIAGNOSIS — R600 Localized edema: Secondary | ICD-10-CM

## 2013-01-11 DIAGNOSIS — G4710 Hypersomnia, unspecified: Secondary | ICD-10-CM

## 2013-01-11 DIAGNOSIS — J441 Chronic obstructive pulmonary disease with (acute) exacerbation: Secondary | ICD-10-CM

## 2013-01-11 MED ORDER — FUROSEMIDE 40 MG PO TABS: 40.0000 mg | ORAL_TABLET | Freq: Every day | ORAL | Status: DC | PRN

## 2013-01-11 NOTE — Assessment & Plan Note (Signed)
She has very severe, refractory disease. It is likely complicated by pulmonary hypertension given her leg swelling and chronic hypoxemia. She is currently not wheezing on physical exam today. Interestingly. She feels much better after long courses of prednisone. We have not been able to pinpoint a specific allergen which exacerbates her disease other than a dog which is been removed from the home.  I appreciate Dr. Lorenso Courier from [facility] evaluating her. He did not recommend changes to her medications. He felt that an overnight sleep study was reasonable and evaluation for possible fungal organisms. He also suggested changing the Spiriva to another agent.  Of note, serologic testing for fungal organisms has been negative.  We are approaching a point where further therapy seems to be of little benefit.    Plan: -Polysomnogram -Change inhalers to Anoro once daily and Asmanex  -stop spiriva and symbicort -continue oxygen as written -obtain results of recent echocardiogram

## 2013-01-11 NOTE — Patient Instructions (Signed)
We will make arrangements for a sleep study at [facility]  Keep using your oxygen as you are doing  Start taking the spiriva and symbicort  Start using the Enora inhaler once a day and the Asmanex inhaler once a day (one puff twice a day)  We will try to help you get a hospital bed  Take the lasix daily until your weight is 235lbs, then use it as needed to maintain that weight.  Always take it with potassium  Keep your appointment with Dr. Mariah Milling  We will see you back in 2 weeks or sooner if needed

## 2013-01-11 NOTE — Telephone Encounter (Signed)
Patient states she will bring paper work in to be filled out.

## 2013-01-11 NOTE — Progress Notes (Signed)
Subjective:    Patient ID: Catherine [name], female    DOB: 1956/08/10, 57 y.o.   MRN: [id number]  Synopsis: Catherine [name] first saw the [facility] clinic in June 2013 for GOLD Grade D COPD.  She has frequent exacerbations, poor functional status, and her FEV1 is 0.91 L (41% pred).  She smoked 1 ppd for 40 years and quit in 2012.  HPI  04/19/12 ROV -- since her last visit Catherine [name] was admitted to [facility] for shortness of breath. I do not have record of this yet but apparently she ruled out for an MI and was treated with steroids. Since her hospital discharge she says that she has had no improvement in her shortness of breath. She continues to have cough without sputum production and she notes chest congestion and chest tightness. She has not had leg swelling. She does not have fever or chills but she has noted frequent sweats. She continues to use oxygen at 2 L continuously which was started during this hospitalization and she is still on a prednisone taper (currently taking 20 mg daily).  05/03/12 ROV --Since her last visit Catherine [name] says that she initially improved with the moxifloxacin therapy that we wrote. However she is still having a lot of trouble with cough and her coughing spells are causing significant shortness of breath and wheezing. She states that when she is at rest she does not wheeze or have shortness of breath. However when she starts coughing her shortness of breath becomes quite significant. She has noted more sinus congestion and sneezing which she thinks may be due to allergies. She notes some indigestion at home and takes protonix for acid reflux. She has not had fevers or chills since her hospitalization. She has chest tightness and some body cramps but has not had significant swelling in her feet.  07/05/2012 ROV --Catherine [name] returns to clinic today stating that she feels lousy. She says that for the last week she has had increasing chest congestion without fevers  or chills. She has had sweats though. She states that she's had increasing shortness of breath in the last 2-3 days and almost went to the emergency department last night. She denies significant sinus symptoms but she states that her coughing spells are lengthy. She feels chest congestion but cannot produce sputum. She has not been well enough to get a flu shot yet this year.  09/14/12 ROV -- Catherine [name] looks better to me today than she has on any of her prior visits. Of course, she was actually recently discharged from the hospital for a flare of COPD. She was treated there with Solu-Medrol and Levaquin. She is currently on a long taper of prednisone and is now down to 10 mg daily. She says that right now she feels "great" and she wants to get up and exercise more. She had some leg swelling during the most recent hospitalization and that has improved.  10/26/12 ROV -- Catherine [name] again is feeling okay. She states that she still has dyspnea with shortness of breath and notes that she has gained 25 pounds since being on prednisone in the last several months. She has shortness of breath with activities like going to the bathroom. However in general her shortness of breath has been better for the last 2 months. She is improved cough and she continues to have no sputum production. She continues to use her oxygen. She tells me that she got rid of the bulldog around Christmas and she has not  been hospitalized since then.  01/11/2013 ROV -- Catherine [name] has had a hard time since her last visit. She was admitted for over a week from March 26 through April 2. During that time she was treated with antibiotics, Solu-Medrol, and Lasix. Since hospital discharge he said increasing shortness of breath and leg swelling. She actually went to the ER about a week ago and was told by the admitting hospitalist that "there is nothing more to do and she should be on morphine and hospice". A prescription for morphine was written and she was discharged  home. In the last couple weeks she's had increasing leg swelling. She also notes sleepiness during the day, morning headaches and frequent nocturia. She has not had recent fevers or chills. She was supposed to see her cardiologist one week ago but was in the emergency room so she couldn't make it. She has had a recent echocardiogram but we don't have the results of that today.   Past Medical History  Diagnosis Date  . Diabetes mellitus   . Leukocytosis   . Hyperlipidemia   . COPD (chronic obstructive pulmonary disease)   . Hyperglycemia   . Hypertension   . S/P cardiac catheterization March 2012    normal coronaries,  due to chest pain ([place])  . Tobacco abuse, in remission     quit oct during hospitalization   . Screening for colon cancer June 2011    polyps found, next one due 2014 ([place])  . Gastritis and duodenitis June 2011    EGD  . Screening for breast cancer Jul 28 2011    normal  . GERD (gastroesophageal reflux disease)   . Obesity (BMI 30-39.9)   . Depression   . Anxiety     Review of Systems  Constitutional: Positive for fatigue. Negative for fever, chills and unexpected weight change.  HENT: Negative for nosebleeds, congestion, rhinorrhea, sneezing and postnasal drip.   Respiratory: Positive for shortness of breath. Negative for cough and choking.   Cardiovascular: Positive for leg swelling. Negative for chest pain.       Objective:   Physical Exam   Filed Vitals:   01/11/13 1429  BP: 128/80  Pulse: 90  Temp: 97.8 F (36.6 C)  TempSrc: Oral  Height: 5\' 3"  (1.6 m)  Weight: 244 lb (110.678 kg)  SpO2: 96%  2L [place]   Gen: obese, no acute distress HEENT: NCAT, EOMi, OP clear,  PULM: CTA B, no wheezing CV: RRR, no mgr, no JVD AB: BS+, soft, nontender, no hsm Ext: warm, pitting bilateral leg edema, no clubbing, no cyanosis   09/2011 and 12/2011 CXR from [facility] reviewed: no clear parenchymal abnormality, some vascular congestion  04/2012 pulmonary function  test ratio 50%, FEV1 0.92 L (41%), TLC 3.39 L (72% predicted) ERV (15% predicted), DLCO 51% predicted  04/2012 CT Chest: no PE, no infiltrate, no evidence of fibrosis  08/2013 CT Chest: No PE, bilateral centrilobular emphysema; few areas of clustered sub centimeter nodules  January 2014 RAST testing> positive for various grasses, ragweed and house dust; most strongly positive for dog dander  January 2014> IgE 40 (within normal limits)  March 2014 >> 12 day hospitalization, steroids, solumedrol, lasix, antibiotics     Assessment & Plan:   COPD (chronic obstructive pulmonary disease) She has very severe, refractory disease. It is likely complicated by pulmonary hypertension given her leg swelling and chronic hypoxemia. She is currently not wheezing on physical exam today. Interestingly. She feels much better after long courses of  prednisone. We have not been able to pinpoint a specific allergen which exacerbates her disease other than a dog which is been removed from the home.  I appreciate Dr. Lorenso Courier from [facility] evaluating her. He did not recommend changes to her medications. He felt that an overnight sleep study was reasonable and evaluation for possible fungal organisms. He also suggested changing the Spiriva to another agent.  Of note, serologic testing for fungal organisms has been negative.  We are approaching a point where further therapy seems to be of little benefit.    Plan: -Polysomnogram -Change inhalers to Anoro once daily and Asmanex  -stop spiriva and symbicort -continue oxygen as written -obtain results of recent echocardiogram  Edema leg Her recent flares of dyspnea have been associated with worsening leg swelling.  This makes me question concomitant pulmonary hypertension.  Plan: -Use Lasix daily until weight is 235 pounds. -Fluid and sodium restriction encouraged -Obtain results of recent echo -Followup with Dr. Mariah Milling    Updated Medication  List Outpatient Encounter Prescriptions as of 01/11/2013  Medication Sig Dispense Refill  . ACCU-CHEK SMARTVIEW test strip TEST BLOOD SUGAR FOUR TIMES DAILY  100 each  12  . albuterol (PROVENTIL HFA) 108 (90 BASE) MCG/ACT inhaler Inhale 2 puffs into the lungs every 6 (six) hours as needed for wheezing.  2 Inhaler  3  . ALPRAZolam (XANAX) 0.25 MG tablet Take 1 tablet (0.25 mg total) by mouth at bedtime as needed.  30 tablet  2  . aspirin 81 MG EC tablet Take 81 mg by mouth daily.        Marland Kitchen atorvastatin (LIPITOR) 80 MG tablet TAKE 1 TABLET BY MOUTH EVERY DAY  30 tablet  2  . budesonide-formoterol (SYMBICORT) 160-4.5 MCG/ACT inhaler Inhale 2 puffs into the lungs 2 (two) times daily.  10.2 g  6  . butalbital-acetaminophen-caffeine (FIORICET, ESGIC) 50-325-40 MG per tablet Take 1 tablet by mouth every 4 (four) hours as needed for headache.      . chlorpheniramine-HYDROcodone (TUSSIONEX) 10-8 MG/5ML LQCR Take 5 mLs by mouth every 8 (eight) hours as needed.  240 mL  3  . docusate sodium (COLACE) 100 MG capsule Take 200 mg by mouth daily.      . furosemide (LASIX) 20 MG tablet Take 20 mg by mouth daily as needed.      Marland Kitchen glipiZIDE (GLUCOTROL) 5 MG tablet Take 1 tablet (5 mg total) by mouth 2 (two) times daily.  60 tablet  11  . HYDROmorphone (DILAUDID) 2 MG tablet Take 1 tablet (2 mg total) by mouth at bedtime as needed for pain.  30 tablet  0  . insulin glargine (LANTUS SOLOSTAR) 100 UNIT/ML injection Inject 25 Units into the skin at bedtime.  5 pen  PRN  . Insulin Syringe-Needle U-100 (INSULIN SYRINGE .5CC/31GX5/16") 31G X 5/16" 0.5 ML MISC Test twice a day  100 each  11  . ipratropium-albuterol (DUONEB) 0.5-2.5 (3) MG/3ML SOLN Take 3 mLs by nebulization every 6 (six) hours as needed.  360 mL  11  . morphine (ROXANOL) 20 MG/ML concentrated solution 0.25 ml by mouth every 1-2 hrs as needed for pain or breathlessness      . NOVOLOG FLEXPEN 100 UNIT/ML injection INJECT 8 UNITS SUBCUTANEOUS THREE TIMES DAILY  BEFORE MEALS  15 mL  11  . pantoprazole (PROTONIX) 40 MG tablet TAKE 1 TABLET BY MOUTH TWICE DAILY  60 tablet  2  . PARoxetine (PAXIL) 10 MG tablet Take 1 tablet (10 mg total)  by mouth every morning.  30 tablet  5  . Potassium Gluconate 595 MG CAPS Take 2 capsules by mouth daily.       . predniSONE (DELTASONE) 10 MG tablet 60 mg daily for one week,  Reduce by 10  Mg weekly  126 tablet  0  . predniSONE (DELTASONE) 10 MG tablet Take 30 mg by mouth daily.      . roflumilast (DALIRESP) 500 MCG TABS tablet Take 1 tablet (500 mcg total) by mouth every other day.  15 tablet  5  . tiotropium (SPIRIVA) 18 MCG inhalation capsule Place 1 capsule (18 mcg total) into inhaler and inhale daily.  30 capsule  6  . traMADol (ULTRAM) 50 MG tablet Take 1 tablet (50 mg total) by mouth every 6 (six) hours as needed for pain (or cough).  120 tablet  1  . [DISCONTINUED] predniSONE (DELTASONE) 10 MG tablet Take 1 tablet (10 mg total) by mouth daily.  30 tablet  0   No facility-administered encounter medications on file as of 01/11/2013.

## 2013-01-11 NOTE — Telephone Encounter (Signed)
Patient states she is returning our phone call. I can not find anywhere in chart that states we have tried to contact her Made pt aware of this she verbalized understanding, nothing further needed at this time

## 2013-01-11 NOTE — Telephone Encounter (Signed)
Patient came in stating [employer] has faxed over a form, needing records on the patient to approve her life insurance. They have not received our part on things and this is due by 5/17. Please advise.

## 2013-01-11 NOTE — Assessment & Plan Note (Signed)
Her recent flares of dyspnea have been associated with worsening leg swelling.  This makes me question concomitant pulmonary hypertension.  Plan: -Use Lasix daily until weight is 235 pounds. -Fluid and sodium restriction encouraged -Obtain results of recent echo -Followup with Dr. Mariah Milling

## 2013-01-12 MED ORDER — ROFLUMILAST 500 MCG PO TABS: 500.0000 ug | ORAL_TABLET | Freq: Every day | ORAL | Status: DC

## 2013-01-19 NOTE — Telephone Encounter (Signed)
[employer] received paperwork verified with patient,

## 2013-01-25 ENCOUNTER — Ambulatory Visit (INDEPENDENT_AMBULATORY_CARE_PROVIDER_SITE_OTHER): Payer: 59 | Admitting: Pulmonary Disease

## 2013-01-25 ENCOUNTER — Encounter: Payer: Self-pay | Admitting: Pulmonary Disease

## 2013-01-25 VITALS — BP 120/70 | HR 90 | Temp 98.1°F | Ht 63.0 in | Wt 241.0 lb

## 2013-01-25 DIAGNOSIS — J449 Chronic obstructive pulmonary disease, unspecified: Secondary | ICD-10-CM

## 2013-01-25 DIAGNOSIS — R609 Edema, unspecified: Secondary | ICD-10-CM

## 2013-01-25 DIAGNOSIS — R600 Localized edema: Secondary | ICD-10-CM

## 2013-01-25 MED ORDER — UMECLIDINIUM-VILANTEROL 62.5-25 MCG/INH IN AEPB: 1.0000 | INHALATION_SPRAY | Freq: Every day | RESPIRATORY_TRACT | Status: DC

## 2013-01-25 NOTE — Patient Instructions (Signed)
Stop Asmanex but let us know if you get short of breath or cough after stopping it Continue using Anoro Keep using lasix with a goal weight of 235lbs  Follow up with the sleep study and Dr. Mariah Milling  We will see you back in 2 months or sooner if needed

## 2013-01-25 NOTE — Assessment & Plan Note (Signed)
It seems like her dyspnea has improved twice recently with diuresis and a visible change in her leg edema.  Paradoxically her weight is up today, but her physical exam is consistent with adequate diuresis since the last visit.  I wonder whether or not she has diastolic dysfunction.  Her 12/2012 echo did not show evidence of pulmonary hypertension.  Plan: -follow up with Dr. Mariah Milling -use lasix daily at the new higher dose until weight 235lbs -educated to keep sodium intake less than 2gm, watch fluid intake

## 2013-01-25 NOTE — Assessment & Plan Note (Signed)
Catherine [name] is doing well this visit.  It is hard for me to know if this is due to switching her to [facility] because she also had improvement in her leg swelling.  Plan: -continue Anoro -stop Asmanex> she is not asthmatic so this isn't adding much; she will call us if she is worse after stopping it -continue oxygen as written -consider pulmonary rehab, though payment for it will be a problem

## 2013-01-25 NOTE — Progress Notes (Signed)
Subjective:    Patient ID: Catherine [name], female    DOB: Jan 21, 1956, 57 y.o.   MRN: [id number]  Synopsis: Catherine [name] first saw the [facility] clinic in June 2013 for GOLD Grade D COPD.  She has frequent exacerbations, poor functional status, and her FEV1 is 0.91 L (41% pred).  She smoked 1 ppd for 40 years and quit in 2012.  HPI  04/19/12 ROV -- since her last visit Catherine [name] was admitted to [facility] for shortness of breath. I do not have record of this yet but apparently she ruled out for an MI and was treated with steroids. Since her hospital discharge she says that she has had no improvement in her shortness of breath. She continues to have cough without sputum production and she notes chest congestion and chest tightness. She has not had leg swelling. She does not have fever or chills but she has noted frequent sweats. She continues to use oxygen at 2 L continuously which was started during this hospitalization and she is still on a prednisone taper (currently taking 20 mg daily).  05/03/12 ROV --Since her last visit Catherine [name] says that she initially improved with the moxifloxacin therapy that we wrote. However she is still having a lot of trouble with cough and her coughing spells are causing significant shortness of breath and wheezing. She states that when she is at rest she does not wheeze or have shortness of breath. However when she starts coughing her shortness of breath becomes quite significant. She has noted more sinus congestion and sneezing which she thinks may be due to allergies. She notes some indigestion at home and takes protonix for acid reflux. She has not had fevers or chills since her hospitalization. She has chest tightness and some body cramps but has not had significant swelling in her feet.  07/05/2012 ROV --Catherine [name] returns to clinic today stating that she feels lousy. She says that for the last week she has had increasing chest congestion without fevers  or chills. She has had sweats though. She states that she's had increasing shortness of breath in the last 2-3 days and almost went to the emergency department last night. She denies significant sinus symptoms but she states that her coughing spells are lengthy. She feels chest congestion but cannot produce sputum. She has not been well enough to get a flu shot yet this year.  09/14/12 ROV -- Catherine [name] looks better to me today than she has on any of her prior visits. Of course, she was actually recently discharged from the hospital for a flare of COPD. She was treated there with Solu-Medrol and Levaquin. She is currently on a long taper of prednisone and is now down to 10 mg daily. She says that right now she feels "great" and she wants to get up and exercise more. She had some leg swelling during the most recent hospitalization and that has improved.  10/26/12 ROV -- Catherine [name] again is feeling okay. She states that she still has dyspnea with shortness of breath and notes that she has gained 25 pounds since being on prednisone in the last several months. She has shortness of breath with activities like going to the bathroom. However in general her shortness of breath has been better for the last 2 months. She is improved cough and she continues to have no sputum production. She continues to use her oxygen. She tells me that she got rid of the bulldog around Christmas and she has not  been hospitalized since then.  01/11/2013 ROV -- Catherine [name] has had a hard time since her last visit. She was admitted for over a week from March 26 through April 2. During that time she was treated with antibiotics, Solu-Medrol, and Lasix. Since hospital discharge he said increasing shortness of breath and leg swelling. She actually went to the ER about a week ago and was told by the admitting hospitalist that "there is nothing more to do and she should be on morphine and hospice". A prescription for morphine was written and she was discharged  home. In the last couple weeks she's had increasing leg swelling. She also notes sleepiness during the day, morning headaches and frequent nocturia. She has not had recent fevers or chills. She was supposed to see her cardiologist one week ago but was in the emergency room so she couldn't make it. She has had a recent echocardiogram but we don't have the results of that today.  01/25/2013 ROV >> Catherine [name] has been doing very well since the last visit. She started using the increased dose of Lasix and stated that it made her breathe better and also decreased her swelling. She has also been using the new inhaler (Anoro and Asmanex) and states that her breathing is better with these. She thinks her cough is better than before and not as bad. She also feels that the hospital bed is helping with her sleeping at night. She still not sleeping great as she is up many times throughout the night. However she does feel hypersomnolence and sleepy throughout the day. She is still not gone for her sleep study.   Past Medical History  Diagnosis Date  . Diabetes mellitus   . Leukocytosis   . Hyperlipidemia   . COPD (chronic obstructive pulmonary disease)   . Hyperglycemia   . Hypertension   . S/P cardiac catheterization March 2012    normal coronaries,  due to chest pain ([place])  . Tobacco abuse, in remission     quit oct during hospitalization   . Screening for colon cancer June 2011    polyps found, next one due 2014 ([place])  . Gastritis and duodenitis June 2011    EGD  . Screening for breast cancer Jul 28 2011    normal  . GERD (gastroesophageal reflux disease)   . Obesity (BMI 30-39.9)   . Depression   . Anxiety     Review of Systems  Constitutional: Positive for fatigue. Negative for fever, chills and unexpected weight change.  HENT: Negative for nosebleeds, congestion, rhinorrhea, sneezing and postnasal drip.   Respiratory: Positive for shortness of breath. Negative for cough and choking.    Cardiovascular: Positive for leg swelling. Negative for chest pain.       Objective:   Physical Exam   Filed Vitals:   01/25/13 1619  BP: 120/70  Pulse: 90  Temp: 98.1 F (36.7 C)  TempSrc: Oral  Height: 5\' 3"  (1.6 m)  Weight: 241 lb (109.317 kg)  SpO2: 94%  2L [place]   Gen: obese, no acute distress HEENT: NCAT, EOMi, OP clear,  PULM: CTA B, no wheezing CV: RRR, no mgr, no JVD AB: BS+, soft, nontender, no hsm Ext: warm, significantly decreased edema in feet, no clubbing, no cyanosis   09/2011 and 12/2011 CX[facility] from [facility] reviewed: no clear parenchymal abnormality, some vascular congestion  04/2012 pulmonary function test ratio 50%, FEV1 0.92 L (41%), TLC 3.39 L (72% predicted) ERV (15% predicted), DLCO 51% predicted  04/2012 CT  Chest: no PE, no infiltrate, no evidence of fibrosis  08/2013 CT Chest: No PE, bilateral centrilobular emphysema; few areas of clustered sub centimeter nodules  January 2014 RAST testing> positive for various grasses, ragweed and house dust; most strongly positive for dog dander  January 2014> IgE 40 (within normal limits)  March 2014 >> 12 day hospitalization, steroids, solumedrol, lasix, antibiotics     Assessment & Plan:   COPD (chronic obstructive pulmonary disease) Catherine [name] is doing well this visit.  It is hard for me to know if this is due to switching her to [facility] because she also had improvement in her leg swelling.  Plan: -continue Anoro -stop Asmanex> she is not asthmatic so this isn't adding much; she will call us if she is worse after stopping it -continue oxygen as written -consider pulmonary rehab, though payment for it will be a problem  Edema leg It seems like her dyspnea has improved twice recently with diuresis and a visible change in her leg edema.  Paradoxically her weight is up today, but her physical exam is consistent with adequate diuresis since the last visit.  I wonder whether or not she has diastolic dysfunction.  Her  12/2012 echo did not show evidence of pulmonary hypertension.  Plan: -follow up with Dr. Mariah Milling -use lasix daily at the new higher dose until weight 235lbs -educated to keep sodium intake less than 2gm, watch fluid intake    Updated Medication List Outpatient Encounter Prescriptions as of 01/25/2013  Medication Sig Dispense Refill  . ACCU-CHEK SMARTVIEW test strip TEST BLOOD SUGAR FOUR TIMES DAILY  100 each  12  . albuterol (PROVENTIL HFA) 108 (90 BASE) MCG/ACT inhaler Inhale 2 puffs into the lungs every 6 (six) hours as needed for wheezing.  2 Inhaler  3  . ALPRAZolam (XANAX) 0.25 MG tablet Take 1 tablet (0.25 mg total) by mouth at bedtime as needed.  30 tablet  2  . aspirin 81 MG EC tablet Take 81 mg by mouth daily.        Marland Kitchen atorvastatin (LIPITOR) 80 MG tablet TAKE 1 TABLET BY MOUTH EVERY DAY  30 tablet  2  . chlorpheniramine-HYDROcodone (TUSSIONEX) 10-8 MG/5ML LQCR Take 5 mLs by mouth every 8 (eight) hours as needed.  240 mL  3  . docusate sodium (COLACE) 100 MG capsule Take 200 mg by mouth daily.      . furosemide (LASIX) 40 MG tablet Take 1 tablet (40 mg total) by mouth daily as needed (for leg swelling and to maintain weight at 235lbs).  30 tablet  1  . glipiZIDE (GLUCOTROL) 5 MG tablet Take 1 tablet (5 mg total) by mouth 2 (two) times daily.  60 tablet  11  . insulin glargine (LANTUS SOLOSTAR) 100 UNIT/ML injection Inject 25 Units into the skin at bedtime.  5 pen  PRN  . Insulin Syringe-Needle U-100 (INSULIN SYRINGE .5CC/31GX5/16") 31G X 5/16" 0.5 ML MISC Test twice a day  100 each  11  . ipratropium-albuterol (DUONEB) 0.5-2.5 (3) MG/3ML SOLN Take 3 mLs by nebulization every 6 (six) hours as needed.  360 mL  11  . mometasone (ASMANEX) 220 MCG/INH inhaler Inhale 1 puff into the lungs 2 (two) times daily.      Marland Kitchen morphine (ROXANOL) 20 MG/ML concentrated solution 0.25 ml by mouth every 1-2 hrs as needed for pain or breathlessness      . NOVOLOG FLEXPEN 100 UNIT/ML injection INJECT 8 UNITS  SUBCUTANEOUS THREE TIMES DAILY BEFORE MEALS  15 mL  11  . PARoxetine (PAXIL) 10 MG tablet Take 1 tablet (10 mg total) by mouth every morning.  30 tablet  5  . Potassium Gluconate 595 MG CAPS Take 2 capsules by mouth daily.       . roflumilast (DALIRESP) 500 MCG TABS tablet Take 1 tablet (500 mcg total) by mouth daily.  15 tablet  5  . traMADol (ULTRAM) 50 MG tablet Take 1 tablet (50 mg total) by mouth every 6 (six) hours as needed for pain (or cough).  120 tablet  1  . Umeclidinium-Vilanterol (ANORO ELLIPTA) 62.5-25 MCG/INH AEPB Inhale 1 puff into the lungs daily.      . butalbital-acetaminophen-caffeine (FIORICET, ESGIC) 50-325-40 MG per tablet Take 1 tablet by mouth every 4 (four) hours as needed for headache.      Marland Kitchen HYDROmorphone (DILAUDID) 2 MG tablet Take 1 tablet (2 mg total) by mouth at bedtime as needed for pain.  30 tablet  0  . pantoprazole (PROTONIX) 40 MG tablet TAKE 1 TABLET BY MOUTH TWICE DAILY  60 tablet  2  . [DISCONTINUED] budesonide-formoterol (SYMBICORT) 160-4.5 MCG/ACT inhaler Inhale 2 puffs into the lungs 2 (two) times daily.  10.2 g  6  . [DISCONTINUED] furosemide (LASIX) 20 MG tablet Take 20 mg by mouth daily as needed.      . [DISCONTINUED] predniSONE (DELTASONE) 10 MG tablet 60 mg daily for one week,  Reduce by 10  Mg weekly  126 tablet  0  . [DISCONTINUED] predniSONE (DELTASONE) 10 MG tablet Take 30 mg by mouth daily.      . [DISCONTINUED] tiotropium (SPIRIVA) 18 MCG inhalation capsule Place 1 capsule (18 mcg total) into inhaler and inhale daily.  30 capsule  6   No facility-administered encounter medications on file as of 01/25/2013.

## 2013-01-27 ENCOUNTER — Encounter (HOSPITAL_BASED_OUTPATIENT_CLINIC_OR_DEPARTMENT_OTHER): Payer: 59

## 2013-01-30 IMAGING — CR DG CHEST 1V PORT
1 series · 1 of 1 positions shown · non-contrast
Comparison: none

REASON FOR EXAM: CP, SOB
COMMENTS:

PROCEDURE:     DXR - DXR PORTABLE CHEST SINGLE VIEW  - March 14, 2012  [DATE]
RESULT:     Comparison: 02/28/2012

[portable]
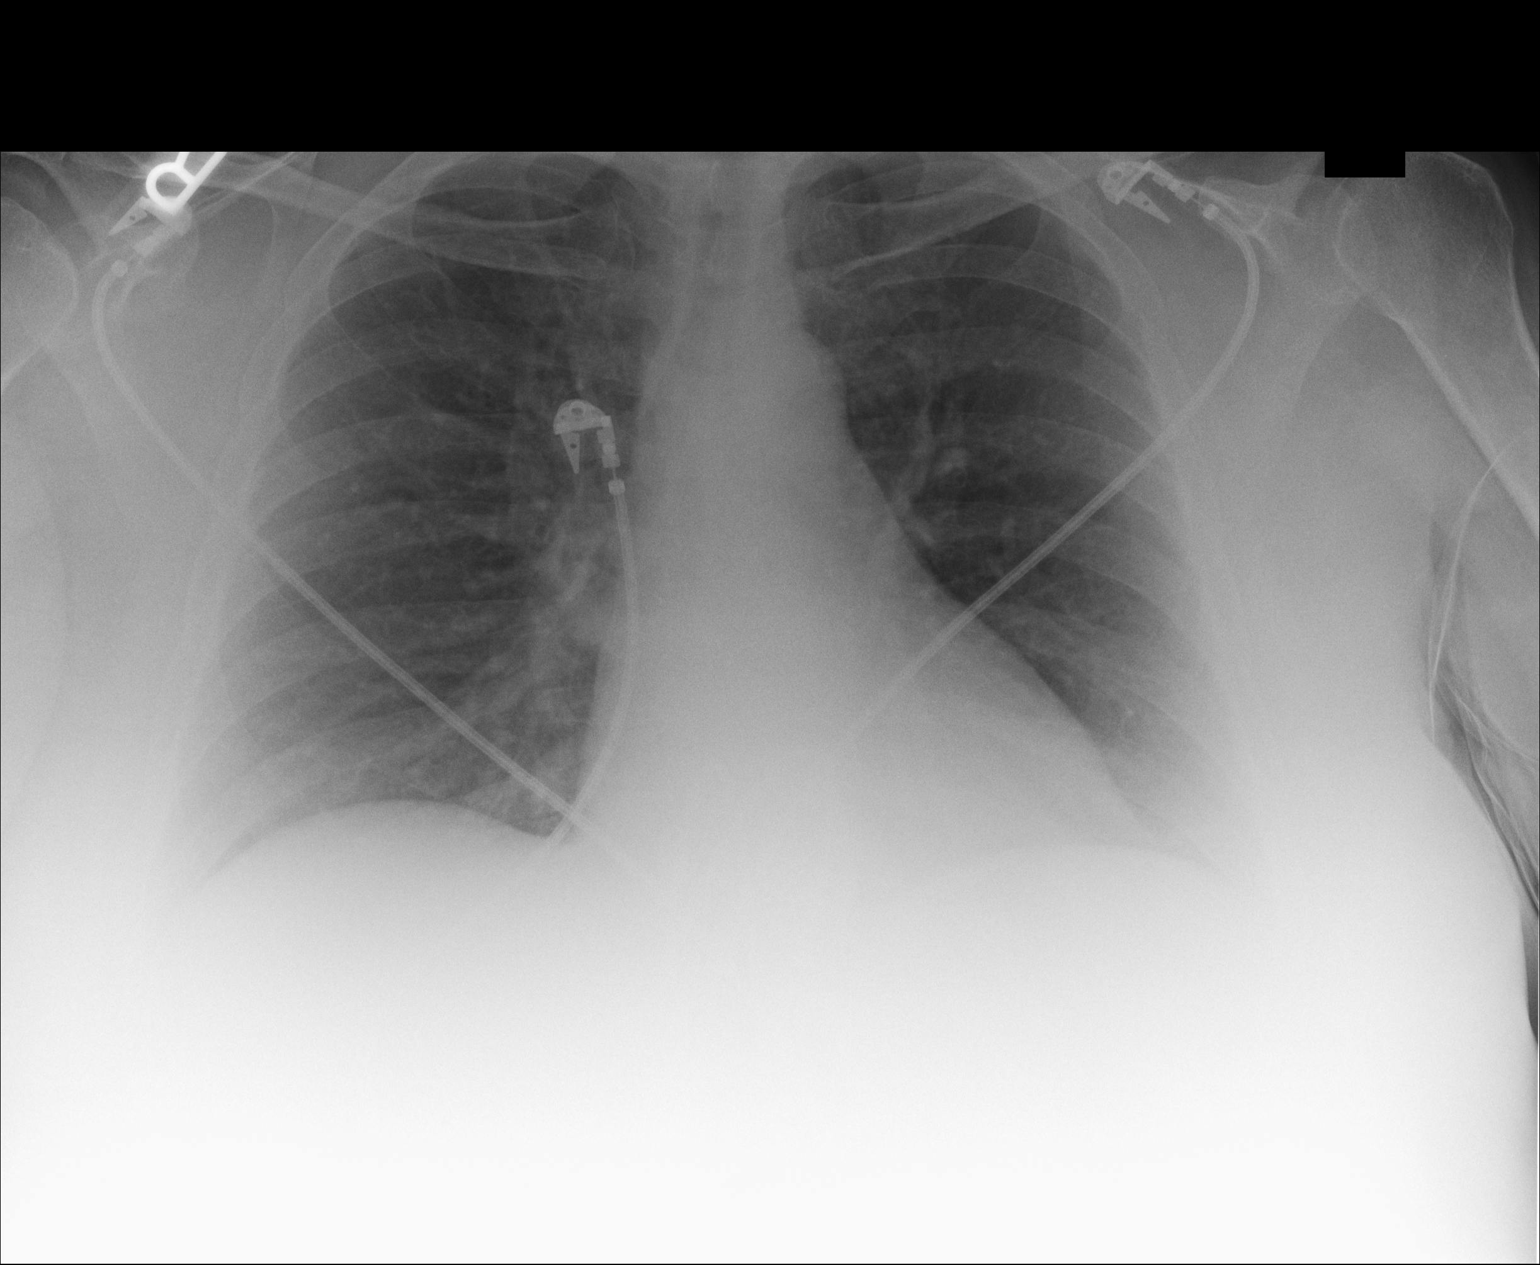

[1 of 1 positions shown; findings below may reference images not displayed]

FINDINGS: Single portable AP chest radiograph is provided.  There is no focal
parenchymal opacity, pleural effusion, or pneumothorax. Normal
cardiomediastinal silhouette. The osseous structures are unremarkable.
IMPRESSION: No acute disease of the che[REDACTED]

## 2013-02-04 ENCOUNTER — Ambulatory Visit (INDEPENDENT_AMBULATORY_CARE_PROVIDER_SITE_OTHER): Payer: 59 | Admitting: Adult Health

## 2013-02-04 ENCOUNTER — Encounter: Payer: Self-pay | Admitting: Adult Health

## 2013-02-04 VITALS — BP 136/80 | HR 92 | Temp 98.1°F | Resp 14 | Wt 241.5 lb

## 2013-02-04 DIAGNOSIS — J309 Allergic rhinitis, unspecified: Secondary | ICD-10-CM

## 2013-02-04 DIAGNOSIS — H109 Unspecified conjunctivitis: Secondary | ICD-10-CM

## 2013-02-04 MED ORDER — LODOXAMIDE TROMETHAMINE 0.1 % OP SOLN: 2.0000 [drp] | Freq: Four times a day (QID) | OPHTHALMIC | Status: DC

## 2013-02-04 NOTE — Assessment & Plan Note (Signed)
Allergic conjunctivitis. Start lodoxamide ophthalmic drops 1-2 drops in both eyes every 6 hours. I have instructed patient to use [employer] baby shampoo to wash her face. Avoid creams, lotions. Also start over-the-counter antihistamine such as Claritin, Allegra or Zyrtec. RTC if symptoms are not improved within one week or sooner if symptoms worsen.

## 2013-02-04 NOTE — Assessment & Plan Note (Signed)
Symptoms consistent with allergies. Start over-the-counter antihistamine such as Claritin, Allegra or Zyrtec. RTC if symptoms are not improved within one week or sooner if needed

## 2013-02-04 NOTE — Patient Instructions (Addendum)
  Apply 1-2 drops in both eyes every 6 hours - Lodoxamide Ophthalmic   Start an oral antihistamine such as Allegra, Zyrtec or Claritin. You can try a generic version of these. Take daily in the morning.  Continue to take tylenol for your general aches and low grade fever.  Please call if your symptoms do not improve within one week or sooner if your symptoms worsen.

## 2013-02-04 NOTE — Progress Notes (Signed)
Subjective:    Patient ID: Catherine [name], female    DOB: December 10, 1955, 57 y.o.   MRN: [id number]  HPI  Patient is a pleasant 57 y/o female who presents to clinic with c/o low grade fever a couple days ago. She also reports chills and malaise. Patient does not recall any sick contacts; however, she has been out in public. She also has dry, red skin around her eyes. She denies using any new creams or soaps. She has been in a pool recently. She has taken benadryl for this. She has also taken tylenol for her low grade temp. Patient denies fever this morning.   Current Outpatient Prescriptions on File Prior to Visit  Medication Sig Dispense Refill  . ACCU-CHEK SMARTVIEW test strip TEST BLOOD SUGAR FOUR TIMES DAILY  100 each  12  . albuterol (PROVENTIL HFA) 108 (90 BASE) MCG/ACT inhaler Inhale 2 puffs into the lungs every 6 (six) hours as needed for wheezing.  2 Inhaler  3  . ALPRAZolam (XANAX) 0.25 MG tablet Take 1 tablet (0.25 mg total) by mouth at bedtime as needed.  30 tablet  2  . aspirin 81 MG EC tablet Take 81 mg by mouth daily.        Marland Kitchen atorvastatin (LIPITOR) 80 MG tablet TAKE 1 TABLET BY MOUTH EVERY DAY  30 tablet  2  . butalbital-acetaminophen-caffeine (FIORICET, ESGIC) 50-325-40 MG per tablet Take 1 tablet by mouth every 4 (four) hours as needed for headache.      . chlorpheniramine-HYDROcodone (TUSSIONEX) 10-8 MG/5ML LQCR Take 5 mLs by mouth every 8 (eight) hours as needed.  240 mL  3  . docusate sodium (COLACE) 100 MG capsule Take 200 mg by mouth daily.      . furosemide (LASIX) 40 MG tablet Take 1 tablet (40 mg total) by mouth daily as needed (for leg swelling and to maintain weight at 235lbs).  30 tablet  1  . glipiZIDE (GLUCOTROL) 5 MG tablet Take 1 tablet (5 mg total) by mouth 2 (two) times daily.  60 tablet  11  . HYDROmorphone (DILAUDID) 2 MG tablet Take 1 tablet (2 mg total) by mouth at bedtime as needed for pain.  30 tablet  0  . insulin glargine (LANTUS SOLOSTAR) 100  UNIT/ML injection Inject 25 Units into the skin at bedtime.  5 pen  PRN  . Insulin Syringe-Needle U-100 (INSULIN SYRINGE .5CC/31GX5/16") 31G X 5/16" 0.5 ML MISC Test twice a day  100 each  11  . ipratropium-albuterol (DUONEB) 0.5-2.5 (3) MG/3ML SOLN Take 3 mLs by nebulization every 6 (six) hours as needed.  360 mL  11  . morphine (ROXANOL) 20 MG/ML concentrated solution 0.25 ml by mouth every 1-2 hrs as needed for pain or breathlessness      . NOVOLOG FLEXPEN 100 UNIT/ML injection INJECT 8 UNITS SUBCUTANEOUS THREE TIMES DAILY BEFORE MEALS  15 mL  11  . pantoprazole (PROTONIX) 40 MG tablet TAKE 1 TABLET BY MOUTH TWICE DAILY  60 tablet  2  . PARoxetine (PAXIL) 10 MG tablet Take 1 tablet (10 mg total) by mouth every morning.  30 tablet  5  . Potassium Gluconate 595 MG CAPS Take 2 capsules by mouth daily.       . roflumilast (DALIRESP) 500 MCG TABS tablet Take 1 tablet (500 mcg total) by mouth daily.  15 tablet  5  . traMADol (ULTRAM) 50 MG tablet Take 1 tablet (50 mg total) by mouth every 6 (six) hours as needed for  pain (or cough).  120 tablet  1  . Umeclidinium-Vilanterol (ANORO ELLIPTA) 62.5-25 MCG/INH AEPB Inhale 1 puff into the lungs daily.  30 each  2   No current facility-administered medications on file prior to visit.    Review of Systems  Constitutional: Positive for fever and chills.  HENT: Positive for rhinorrhea and postnasal drip. Negative for sore throat and sinus pressure.   Eyes: Positive for discharge, redness and itching.  Respiratory: Positive for cough and shortness of breath. Negative for wheezing.   Skin:       Red, itchy skin around eyes.  Allergic/Immunologic: Positive for environmental allergies.   BP 136/80  Pulse 92  Temp(Src) 98.1 F (36.7 C) (Oral)  Resp 14  Wt 241 lb 8 oz (109.544 kg)  BMI 42.79 kg/m2  SpO2 95%    Objective:   Physical Exam  Constitutional: She is oriented to person, place, and time. No distress.  HENT:  Mouth/Throat: No oropharyngeal  exudate.  Cardiovascular: Normal rate, regular rhythm and normal heart sounds.  Exam reveals no gallop.   No murmur heard. Pulmonary/Chest: Effort normal and breath sounds normal. No respiratory distress. She has no wheezes.  Neurological: She is alert and oriented to person, place, and time.  Skin: Skin is warm and dry. There is erythema.  Erythema around bilateral eyes  Psychiatric: She has a normal mood and affect. Her behavior is normal. Judgment and thought content normal.       Assessment & Plan:

## 2013-02-13 ENCOUNTER — Ambulatory Visit (HOSPITAL_BASED_OUTPATIENT_CLINIC_OR_DEPARTMENT_OTHER): Payer: 59 | Attending: Pulmonary Disease

## 2013-02-13 VITALS — Ht 63.0 in | Wt 237.0 lb

## 2013-02-13 DIAGNOSIS — G4733 Obstructive sleep apnea (adult) (pediatric): Secondary | ICD-10-CM

## 2013-02-13 DIAGNOSIS — I498 Other specified cardiac arrhythmias: Secondary | ICD-10-CM | POA: Insufficient documentation

## 2013-02-13 DIAGNOSIS — R259 Unspecified abnormal involuntary movements: Secondary | ICD-10-CM | POA: Insufficient documentation

## 2013-02-13 DIAGNOSIS — R400 Somnolence: Secondary | ICD-10-CM

## 2013-02-13 DIAGNOSIS — I491 Atrial premature depolarization: Secondary | ICD-10-CM | POA: Insufficient documentation

## 2013-02-27 DIAGNOSIS — G4733 Obstructive sleep apnea (adult) (pediatric): Secondary | ICD-10-CM

## 2013-02-27 NOTE — Procedures (Signed)
Catherine [name], COPPA           ACCOUNT NO.:  [id number]  MEDICAL RECORD NO.:  [id number]          PATIENT TYPE:  OUT  LOCATION:  SLEEP CENTER                 FACILITY[facility]  [facility]  PHYSICIAN:  Barbaraann Share, MD,FCCPDATE OF BIRTH:  1956/01/28  DATE OF STUDY:  02/13/2013                           NOCTURNAL POLYSOMNOGRAM  REFERRING PHYSICIAN:  DOUGLAS Heber [place], MD  LOCATION:  Sleep Lab.  REFERRING PHYSICIAN:  Veto Kemps, MD  INDICATIONS:  Hypersomnia with sleep apnea.  EPWORTH SLEEPINESS SCORE:  10  SLEEP ARCHITECTURE:  The patient had a total sleep time of only 101 minutes with no slow-wave sleep or REM achieved.  Sleep onset latency was mildly prolonged at 37 minutes and sleep efficiency was very poor at 26%.  RESPIRATORY DATA:  The patient was found to have no obstructive apneas, but did have 21 obstructive hypopneas, giving her an apnea-hypopnea index of 13 events per hour over her short total sleep time.  The events occurred in all body positions, and there was mild snoring noted throughout.  OXYGEN DATA:  The patient was found to have O2 desaturation as low as 86% with the patient's obstructive events.  CARDIAC DATA:  Occasional PAC noted, as well as one 4 seconds run of SVT at a rate of 180 beats per minute.  MOVEMENTS/PARASOMNIA:  The patient had large numbers of periodic limb movements with what appears to be significant sleep disruption.  There were no abnormal behaviors seen.  IMPRESSIONS/RECOMMENDATIONS: 1. Mild obstructive sleep apnea/hypopnea syndrome with an AHI of 13     events per hour and oxygen desaturation as low as 86%.  However,     the patient had a total sleep time of only 101 minutes, and never     achieved REM.  I suspect that her degree of sleep apnea is being     underestimated.  The patient may have a more accurate study by     doing home sleep testing in her own environment.  Clinical     correlation is suggested. 2.  Occasional PAC noted, as well as one 4 second run of     supraventricular tachycardia at a rate of 180 beats per minute. 3. Large numbers of leg jerks with what appears to be significant     sleep disruption.  It is unclear whether this is related to a     primary movement disorder of sleep, or if it may simply be related     to chronic pain, or other musculoskeletal issue.  Clinical     correlation is suggested.  If the patient is felt to possibly have     the restless legs syndrome or the periodic limb movement disorder,     I would consider a trial of treatment prior to doing home     sleep testing to evaluate further for sleep-disordered breathing.     This may allow the patient to have an increased total sleep time, and     therefore a more accurate study at home.     Barbaraann Share, MD,FCCP Diplomate, American Board of Sleep Medicine    [facility]/MEDQ  D:  02/27/2013 13:51:08  T:  02/27/2013 14:03:21  Job:  [id number] 

## 2013-03-01 ENCOUNTER — Other Ambulatory Visit: Payer: Self-pay | Admitting: Pulmonary Disease

## 2013-03-01 ENCOUNTER — Encounter: Payer: Self-pay | Admitting: Pulmonary Disease

## 2013-03-01 ENCOUNTER — Telehealth: Payer: Self-pay | Admitting: *Deleted

## 2013-03-01 DIAGNOSIS — G4733 Obstructive sleep apnea (adult) (pediatric): Secondary | ICD-10-CM

## 2013-03-01 NOTE — Telephone Encounter (Signed)
Spoke with pt and notified of results per Dr.McQuaid. Pt verbalized understanding and denied any questions. Order sent to [facility]

## 2013-03-01 NOTE — Telephone Encounter (Signed)
Message copied by Christen Butter on Tue Mar 01, 2013  9:51 AM ------      Message from: Max Fickle B      Created: Tue Mar 01, 2013  9:06 AM       L,            Her polysomnogram showed sleep apnea.  Can we set up a home Resmed autotitrating device for her with auto preset 5-20cm water with 2L O2 bleed in?            Thanks,      B       ------

## 2013-03-14 ENCOUNTER — Other Ambulatory Visit: Payer: Self-pay | Admitting: Internal Medicine

## 2013-03-15 ENCOUNTER — Other Ambulatory Visit: Payer: Self-pay | Admitting: Internal Medicine

## 2013-03-29 ENCOUNTER — Ambulatory Visit: Payer: 59 | Admitting: Pulmonary Disease

## 2013-03-30 ENCOUNTER — Other Ambulatory Visit: Payer: Self-pay | Admitting: Internal Medicine

## 2013-04-05 ENCOUNTER — Ambulatory Visit (INDEPENDENT_AMBULATORY_CARE_PROVIDER_SITE_OTHER): Payer: 59 | Admitting: Pulmonary Disease

## 2013-04-05 ENCOUNTER — Encounter: Payer: Self-pay | Admitting: Pulmonary Disease

## 2013-04-05 VITALS — BP 142/82 | HR 100 | Temp 98.3°F | Ht 63.0 in | Wt 240.8 lb

## 2013-04-05 DIAGNOSIS — J449 Chronic obstructive pulmonary disease, unspecified: Secondary | ICD-10-CM

## 2013-04-05 DIAGNOSIS — R911 Solitary pulmonary nodule: Secondary | ICD-10-CM

## 2013-04-05 DIAGNOSIS — M7989 Other specified soft tissue disorders: Secondary | ICD-10-CM | POA: Insufficient documentation

## 2013-04-05 MED ORDER — DOXYCYCLINE HYCLATE 50 MG PO CAPS: 100.0000 mg | ORAL_CAPSULE | Freq: Two times a day (BID) | ORAL | Status: AC

## 2013-04-05 NOTE — Assessment & Plan Note (Signed)
Will arrange repeat Chest CT to ensure no growth

## 2013-04-05 NOTE — Assessment & Plan Note (Addendum)
This has been a very stable interval for Catherine [name] not that she is on the Anoro.  Unfortunately, I think that the muscle and joint aches she has been experiencing are likely a side effect of Anoro.  We discussed it at length and she would prefer to continue on the drug for now because her respiratory symptoms have really improved a lot.  Plan: -continue anoro -continue monthly doxycycline -continue duoneb prn -continue regular exercise -she requests a trial off of Roflumilast due to cost, fine by me -f/u 3 months

## 2013-04-05 NOTE — Patient Instructions (Addendum)
Use warm compresses on the pain on the leg. Call the office or schedule an appointment with your primary care office to have that rechecked. If joint pain not relieved in one month, we will take you off the Anoro and will try something else. Follow up in 3 months

## 2013-04-05 NOTE — Assessment & Plan Note (Signed)
She has a tender swelling in her left leg which is not red or warm.  Unlikely infectious, I wonder if this is a seroma.    Plan: -warm compresses tid -if no improvement, call us and we will order an ultrasound

## 2013-04-05 NOTE — Progress Notes (Signed)
Subjective:    Patient ID: Catherine [name], female    DOB: 04/10/1956, 57 y.o.   MRN: [id number]  HPI Catherine [name] is a 57 year old female with history of COPD, HTN, GERD, pulmonary nodule, OSA, and allergic rhinitis.   ROV >>> 04/05/13 She presents for F/U visit with no CC.  Breathing has gotten better since last visit. She states the Anoro has helped significantly and her DOE is much improved. Walking from bedroom to bathroom no longer leaves her SOB and coughing. She has noted high blood sugars since starting the Anoro (150-200 nonfasting) and some joint pain. She recently has some digital swelling, but denies trauma, numbness or motor deficits. She uses the O2 2L oxygen at night with Cpap and is doing well on that. She uses her emergency albuterol inhaler 2-3x a day and only takes her duoneb prn. She denies chest tightness, sob, and leg swelling. She states she has a "knot" on the left lower leg that has recently been hurting her, but denies any trauma to the area. Past Medical History  Diagnosis Date  . Diabetes mellitus   . Leukocytosis   . Hyperlipidemia   . COPD (chronic obstructive pulmonary disease)   . Hyperglycemia   . Hypertension   . S/P cardiac catheterization March 2012    normal coronaries,  due to chest pain ([facility])  . Tobacco abuse, in remission     quit oct during hospitalization   . Screening for colon cancer June 2011    polyps found, next one due 2014 ([facility])  . Gastritis and duodenitis June 2011    EGD  . Screening for breast cancer Jul 28 2011    normal  . GERD (gastroesophageal reflux disease)   . Obesity (BMI 30-39.9)   . Depression   . Anxiety      Review of Systems  Constitutional: Negative for fever, chills, diaphoresis, appetite change and fatigue.  HENT: Negative for congestion, sore throat, rhinorrhea, sneezing, postnasal drip and sinus pressure.   Respiratory: Positive for cough and shortness of breath. Negative for choking, chest  tightness and wheezing.   Cardiovascular: Negative for chest pain and leg swelling.    Objective:   Physical Exam Filed Vitals:   04/05/13 1455  BP: 142/82  Pulse: 100  Temp: 98.3 F (36.8 C)  TempSrc: Oral  Height: 5\' 3"  (1.6 m)  Weight: 240 lb 12.8 oz (109.226 kg)  SpO2: 91%     VITAL SIGNS:   HEENT: Head is normocephalic and atraumatic. Extraocular muscles are intact. Pupils are equal, round, and reactive to light. Nares appeared normal, turbinates are non inflamed and polyps noted in right nare. Mouth is well hydrated and without lesions. Mucous membranes are moist. Posterior pharynx clear of any exudate or lesions. NECK: Supple. No carotid bruits. No lymphadenopathy or thyromegaly. LUNGS: Clear to auscultation. HEART: Regular rate and rhythm without murmur.  ABDOMEN: Soft, nontender, and nondistended. Positive bowel sounds. No hepatosplenomegaly was noted.  EXTREMITIES: Without any cyanosis, clubbing, rash, lesions or edema.  NEUROLOGIC: Cranial nerves II through XII are grossly intact.  PSYCHIATRIC: Flat affect, but denies suicidal or homicidal ideations. SKIN: No ulceration or induration present.      Assessment & Plan:   COPD (chronic obstructive pulmonary disease) This has been a very stable interval for Catherine [name] not that she is on the Anoro.  Unfortunately, I think that the muscle and joint aches she has been experiencing are likely a side effect of Anoro.  We discussed  it at length and she would prefer to continue on the drug for now because her respiratory symptoms have really improved a lot.  Plan: -continue anoro -continue monthly doxycycline -continue duoneb prn -continue regular exercise -she requests a trial off of Roflumilast due to cost, fine by me -f/u 3 months  Leg swelling She has a tender swelling in her left leg which is not red or warm.  Unlikely infectious, I wonder if this is a seroma.    Plan: -warm compresses tid -if no improvement, call  us and we will order an ultrasound  Pulmonary nodule Will arrange repeat Chest CT to ensure no growth  Attending: I have seen and examined the patient with nurse practitioner/resident and agree with and have edited the note above.  Yolonda Kida PCCM Pager: [phone] Cell: [phone] If no response, call [phone]  Updated Medication List Outpatient Encounter Prescriptions as of 04/05/2013  Medication Sig Dispense Refill  . ACCU-CHEK SMARTVIEW test strip TEST BLOOD SUGAR FOUR TIMES DAILY  100 each  12  . albuterol (PROVENTIL HFA) 108 (90 BASE) MCG/ACT inhaler Inhale 2 puffs into the lungs every 6 (six) hours as needed for wheezing.  2 Inhaler  3  . ALPRAZolam (XANAX) 0.25 MG tablet Take 1 tablet (0.25 mg total) by mouth at bedtime as needed.  30 tablet  2  . aspirin 81 MG EC tablet Take 81 mg by mouth daily.        Marland Kitchen atorvastatin (LIPITOR) 80 MG tablet TAKE 1 TABLET BY MOUTH EVERY DAY  30 tablet  5  . butalbital-acetaminophen-caffeine (FIORICET, ESGIC) 50-325-40 MG per tablet Take 1 tablet by mouth every 4 (four) hours as needed for headache.      . docusate sodium (COLACE) 100 MG capsule Take 200 mg by mouth daily.      . furosemide (LASIX) 40 MG tablet Take 1 tablet (40 mg total) by mouth daily as needed (for leg swelling and to maintain weight at 235lbs).  30 tablet  1  . glipiZIDE (GLUCOTROL) 5 MG tablet Take 1 tablet (5 mg total) by mouth 2 (two) times daily.  60 tablet  11  . Insulin Syringe-Needle U-100 (INSULIN SYRINGE .5CC/31GX5/16") 31G X 5/16" 0.5 ML MISC Test twice a day  100 each  11  . ipratropium-albuterol (DUONEB) 0.5-2.5 (3) MG/3ML SOLN Take 3 mLs by nebulization every 6 (six) hours as needed.  360 mL  11  . NOVOLOG FLEXPEN 100 UNIT/ML injection INJECT 8 UNITS SUBCUTANEOUS THREE TIMES DAILY BEFORE MEALS  15 mL  11  . pantoprazole (PROTONIX) 40 MG tablet TAKE 1 TABLET BY MOUTH TWICE DAILY  60 tablet  5  . PARoxetine (PAXIL) 10 MG tablet TAKE 1 TABLET BY MOUTH  EVERY MORNING  30 tablet  2  . Potassium Gluconate 595 MG CAPS Take 2 capsules by mouth daily.       Marland Kitchen Umeclidinium-Vilanterol (ANORO ELLIPTA) 62.5-25 MCG/INH AEPB Inhale 1 puff into the lungs daily.  30 each  2  . chlorpheniramine-HYDROcodone (TUSSIONEX) 10-8 MG/5ML LQCR Take 5 mLs by mouth every 8 (eight) hours as needed.  240 mL  3  . [START ON 04/08/2013] doxycycline (VIBRAMYCIN) 50 MG capsule Take 2 capsules (100 mg total) by mouth 2 (two) times daily.  28 capsule  3  . insulin glargine (LANTUS SOLOSTAR) 100 UNIT/ML injection Inject 25 Units into the skin at bedtime.  5 pen  PRN  . traMADol (ULTRAM) 50 MG tablet Take 1 tablet (50 mg total) by  mouth every 6 (six) hours as needed for pain (or cough).  120 tablet  1  . [DISCONTINUED] HYDROmorphone (DILAUDID) 2 MG tablet Take 1 tablet (2 mg total) by mouth at bedtime as needed for pain.  30 tablet  0  . [DISCONTINUED] lodoxamide (ALOMIDE) 0.1 % ophthalmic solution Place 2 drops into both eyes 4 (four) times daily.  10 mL  3  . [DISCONTINUED] mometasone (ASMANEX) 220 MCG/INH inhaler Inhale 2 puffs into the lungs daily.      . [DISCONTINUED] morphine (ROXANOL) 20 MG/ML concentrated solution 0.25 ml by mouth every 1-2 hrs as needed for pain or breathlessness      . [DISCONTINUED] roflumilast (DALIRESP) 500 MCG TABS tablet Take 1 tablet (500 mcg total) by mouth daily.  15 tablet  5   No facility-administered encounter medications on file as of 04/05/2013.

## 2013-04-06 ENCOUNTER — Telehealth: Payer: Self-pay | Admitting: *Deleted

## 2013-04-06 DIAGNOSIS — R911 Solitary pulmonary nodule: Secondary | ICD-10-CM

## 2013-04-06 NOTE — Telephone Encounter (Signed)
Pt informed and agrees to CT Chest Order was sent to [facility]

## 2013-04-06 NOTE — Telephone Encounter (Signed)
Message copied by Christen Butter on Wed Apr 06, 2013  4:52 PM ------      Message from: Lupita Leash      Created: Tue Apr 05, 2013  9:04 PM       L,            I forgot to tell her that she needs a repeat CT at [facility] (no contrast) to follow up some very small nodules seen in December.  Can you order that?  If she needs me to talk to her about it I'm happy to, just let me know and I'll call her tomorrow night.            Thanks      B ------

## 2013-04-13 ENCOUNTER — Encounter: Payer: Self-pay | Admitting: Internal Medicine

## 2013-05-06 ENCOUNTER — Other Ambulatory Visit: Payer: Self-pay | Admitting: *Deleted

## 2013-05-06 MED ORDER — UMECLIDINIUM-VILANTEROL 62.5-25 MCG/INH IN AEPB: 1.0000 | INHALATION_SPRAY | Freq: Every day | RESPIRATORY_TRACT | Status: DC

## 2013-05-10 ENCOUNTER — Telehealth: Payer: Self-pay | Admitting: Pulmonary Disease

## 2013-05-10 NOTE — Telephone Encounter (Signed)
I spoke with [facility]. I was advised it did not need a PA. They had to order the medication and should arrive tomorrow i called and made pt aware. Nothing further needed

## 2013-05-25 ENCOUNTER — Encounter: Payer: Self-pay | Admitting: Adult Health

## 2013-05-25 ENCOUNTER — Ambulatory Visit: Payer: 59 | Admitting: Adult Health

## 2013-05-25 ENCOUNTER — Ambulatory Visit (INDEPENDENT_AMBULATORY_CARE_PROVIDER_SITE_OTHER): Payer: 59 | Admitting: Adult Health

## 2013-05-25 VITALS — BP 126/62 | HR 105 | Temp 98.2°F | Resp 24 | Ht 63.0 in | Wt 248.0 lb

## 2013-05-25 DIAGNOSIS — J441 Chronic obstructive pulmonary disease with (acute) exacerbation: Secondary | ICD-10-CM

## 2013-05-25 MED ORDER — HYDROCOD POLST-CHLORPHEN POLST 10-8 MG/5ML PO LQCR: 5.0000 mL | Freq: Two times a day (BID) | ORAL | Status: DC | PRN

## 2013-05-25 MED ORDER — LEVOFLOXACIN 750 MG PO TABS: 750.0000 mg | ORAL_TABLET | Freq: Every day | ORAL | Status: AC

## 2013-05-25 MED ORDER — PREDNISONE 10 MG PO TABS: ORAL_TABLET | ORAL | Status: DC

## 2013-05-25 MED ORDER — FLUCONAZOLE 150 MG PO TABS: 150.0000 mg | ORAL_TABLET | Freq: Once | ORAL | Status: DC

## 2013-05-25 NOTE — Progress Notes (Signed)
Subjective:    Patient ID: Catherine [name], female    DOB: March 27, 1956, 57 y.o.   MRN: [id number]  HPI  Patient is a 57 y/o female with hx of COPD, CAD, HTN, HLD who presents with sinus problems including sinus pressure, fever, cough, dry throat, shortness of breath. She has been using albuterol and duoneb breathing treatments which helps her shortness of breath. She is currently using her oxygen. She reports only using it as needed. She is also having some post nasal drip. She reports also using afrin from severe congestion. Fever last night was 101.   Current Outpatient Prescriptions on File Prior to Visit  Medication Sig Dispense Refill  . ACCU-CHEK SMARTVIEW test strip TEST BLOOD SUGAR FOUR TIMES DAILY  100 each  12  . albuterol (PROVENTIL HFA) 108 (90 BASE) MCG/ACT inhaler Inhale 2 puffs into the lungs every 6 (six) hours as needed for wheezing.  2 Inhaler  3  . ALPRAZolam (XANAX) 0.25 MG tablet Take 1 tablet (0.25 mg total) by mouth at bedtime as needed.  30 tablet  2  . aspirin 81 MG EC tablet Take 81 mg by mouth daily.        Marland Kitchen atorvastatin (LIPITOR) 80 MG tablet TAKE 1 TABLET BY MOUTH EVERY DAY  30 tablet  5  . butalbital-acetaminophen-caffeine (FIORICET, ESGIC) 50-325-40 MG per tablet Take 1 tablet by mouth every 4 (four) hours as needed for headache.      . chlorpheniramine-HYDROcodone (TUSSIONEX) 10-8 MG/5ML LQCR Take 5 mLs by mouth every 8 (eight) hours as needed.  240 mL  3  . docusate sodium (COLACE) 100 MG capsule Take 200 mg by mouth daily.      . furosemide (LASIX) 40 MG tablet Take 1 tablet (40 mg total) by mouth daily as needed (for leg swelling and to maintain weight at 235lbs).  30 tablet  1  . glipiZIDE (GLUCOTROL) 5 MG tablet Take 1 tablet (5 mg total) by mouth 2 (two) times daily.  60 tablet  11  . insulin glargine (LANTUS SOLOSTAR) 100 UNIT/ML injection Inject 25 Units into the skin at bedtime.  5 pen  PRN  . Insulin Syringe-Needle U-100 (INSULIN SYRINGE  .5CC/31GX5/16") 31G X 5/16" 0.5 ML MISC Test twice a day  100 each  11  . ipratropium-albuterol (DUONEB) 0.5-2.5 (3) MG/3ML SOLN Take 3 mLs by nebulization every 6 (six) hours as needed.  360 mL  11  . NOVOLOG FLEXPEN 100 UNIT/ML injection INJECT 8 UNITS SUBCUTANEOUS THREE TIMES DAILY BEFORE MEALS  15 mL  11  . pantoprazole (PROTONIX) 40 MG tablet TAKE 1 TABLET BY MOUTH TWICE DAILY  60 tablet  5  . PARoxetine (PAXIL) 10 MG tablet TAKE 1 TABLET BY MOUTH EVERY MORNING  30 tablet  2  . Potassium Gluconate 595 MG CAPS Take 2 capsules by mouth daily.       . traMADol (ULTRAM) 50 MG tablet Take 1 tablet (50 mg total) by mouth every 6 (six) hours as needed for pain (or cough).  120 tablet  1  . Umeclidinium-Vilanterol (ANORO ELLIPTA) 62.5-25 MCG/INH AEPB Inhale 1 puff into the lungs daily.  30 each  2   No current facility-administered medications on file prior to visit.    Review of Systems  Constitutional: Positive for fever and chills.  HENT: Positive for congestion, sore throat, rhinorrhea, postnasal drip and sinus pressure.   Respiratory: Positive for cough and shortness of breath. Negative for wheezing.   Cardiovascular: Negative for chest  pain.       Left hand swollen. Ongoing since before April  Gastrointestinal: Negative.   Genitourinary: Negative.   Musculoskeletal:       Left hand slightly swollen. Reports ongoing since April.   Neurological:       Slightly off balance today  Psychiatric/Behavioral: Negative.   All other systems reviewed and are negative.    BP 126/62  Pulse 105  Temp(Src) 98.2 F (36.8 C) (Oral)  Resp 24  Ht 5\' 3"  (1.6 m)  Wt 248 lb (112.492 kg)  BMI 43.94 kg/m2  SpO2 92%    Objective:   Physical Exam  Constitutional: She is oriented to person, place, and time.  HENT:  Head: Normocephalic and atraumatic.  Pharyngeal erythema.  Neck: No tracheal deviation present. No thyromegaly present.  Cardiovascular: Normal rate, regular rhythm, normal heart  sounds and intact distal pulses.  Exam reveals no gallop.   No murmur heard. Pulmonary/Chest: Effort normal. She has no wheezes. She has no rales.  Decreased air movement left posterior apex.  Lymphadenopathy:    She has cervical adenopathy.  Neurological: She is alert and oriented to person, place, and time.  Skin: Skin is warm.  Psychiatric: She has a normal mood and affect. Her behavior is normal. Thought content normal.      Assessment & Plan:

## 2013-05-25 NOTE — Assessment & Plan Note (Addendum)
Prednisone taper - 6 tablets and taper by 1/2 tablet daily until done. Tussionex for severe cough. Levaquin 750 mg x 7 days. If no improvement within 3-4 days please call the office.

## 2013-05-25 NOTE — Patient Instructions (Addendum)
Prednisone taper as follows:  Day #1 take 6 tablets Day #2 takes 5 and half tablets Day #3 take 5 tablets Day #4 take 4 and half tablets Day #5 take 4 tablets Day #6 take 3-1/2 tablets Day #7 take 3 tablets Day #8 take 2-1/2 tablets Day #9 take 2 tablets Day #10 take 1-1/2 tablets Day #11 take 1 tablet Day #12 take half a tablet and complete  Levaquin 750 mg daily for 7 days.  Tussionex for cough

## 2013-06-08 ENCOUNTER — Other Ambulatory Visit: Payer: Self-pay | Admitting: Internal Medicine

## 2013-06-14 ENCOUNTER — Ambulatory Visit (INDEPENDENT_AMBULATORY_CARE_PROVIDER_SITE_OTHER): Payer: 59 | Admitting: Pulmonary Disease

## 2013-06-14 ENCOUNTER — Encounter: Payer: Self-pay | Admitting: Pulmonary Disease

## 2013-06-14 VITALS — BP 134/82 | HR 106 | Ht 63.0 in | Wt 248.0 lb

## 2013-06-14 DIAGNOSIS — G4733 Obstructive sleep apnea (adult) (pediatric): Secondary | ICD-10-CM

## 2013-06-14 DIAGNOSIS — J449 Chronic obstructive pulmonary disease, unspecified: Secondary | ICD-10-CM

## 2013-06-14 DIAGNOSIS — R911 Solitary pulmonary nodule: Secondary | ICD-10-CM

## 2013-06-14 DIAGNOSIS — M7989 Other specified soft tissue disorders: Secondary | ICD-10-CM

## 2013-06-14 MED ORDER — PREDNISONE 10 MG PO TABS: ORAL_TABLET | ORAL | Status: DC

## 2013-06-14 NOTE — Progress Notes (Signed)
Subjective:    Patient ID: Catherine [name], female    DOB: 03-03-1956, 57 y.o.   MRN: [id number]  HPI  Olive Zmuda is a 57 year old female with history of COPD, HTN, GERD, pulmonary nodule, OSA, and allergic rhinitis.   ROV >>> 04/05/13 She presents for F/U visit with no CC.  Breathing has gotten better since last visit. She states the Anoro has helped significantly and her DOE is much improved. Walking from bedroom to bathroom no longer leaves her SOB and coughing. She has noted high blood sugars since starting the Anoro (150-200 nonfasting) and some joint pain. She recently has some digital swelling, but denies trauma, numbness or motor deficits. She uses the O2 2L oxygen at night with Cpap and is doing well on that. She uses her emergency albuterol inhaler 2-3x a day and only takes her duoneb prn. She denies chest tightness, sob, and leg swelling. She states she has a "knot" on the left lower leg that has recently been hurting her, but denies any trauma to the area.  06/14/2013 ROV > Just woke up three weeks ago and felt bad, no sick contacts.  She was treated with Levaquin and prednisone, her breathing hasn't improved much.  She has been having hand swelling and her fingers have been swelling more lately.  She is taking tussionex and using albuterol 3-4 times a day. She is coughing up mucus, but the cough is mostly dry; She does feel chest congestion. She has had chills, has noted a low grade temp as high as 100.2 at home over a weeks go, none since then.  No new reflux or sinus congestoin.   Past Medical History  Diagnosis Date  . Diabetes mellitus   . Leukocytosis   . Hyperlipidemia   . COPD (chronic obstructive pulmonary disease)   . Hyperglycemia   . Hypertension   . S/P cardiac catheterization March 2012    normal coronaries,  due to chest pain ([facility])  . Tobacco abuse, in remission     quit oct during hospitalization   . Screening for colon cancer June 2011    polyps  found, next one due 2014 ([facility])  . Gastritis and duodenitis June 2011    EGD  . Screening for breast cancer Jul 28 2011    normal  . GERD (gastroesophageal reflux disease)   . Obesity (BMI 30-39.9)   . Depression   . Anxiety      Review of Systems  Constitutional: Negative for fever, chills, diaphoresis, appetite change and fatigue.  HENT: Negative for congestion, sore throat, rhinorrhea, sneezing, postnasal drip and sinus pressure.   Respiratory: Positive for cough and shortness of breath. Negative for choking, chest tightness and wheezing.   Cardiovascular: Negative for chest pain and leg swelling.    Objective:   Physical Exam  Filed Vitals:   06/14/13 1522  BP: 134/82  Pulse: 106  Height: 5\' 3"  (1.6 m)  Weight: 248 lb (112.492 kg)  SpO2: 95%  2L [place]  Gen: Obese, no distress HEENT: NCAT, PERRL PULM: Wheezing bilaterally, poor air movement CV: RRR, distant heart sounds AB: BS+, nontender Ext: trace ankle edema, swelling over interphalangeal bones between MCP/PIP, no clear joint swelling       Assessment & Plan:   COPD (chronic obstructive pulmonary disease) This has been a rough month for Chrystle with a prolonged flare of COPD.  I am concerned that stopping the Daliresp contributed to this.  She tends to do better with  lasix after several days of furosemide as I think that there is a component of diastolic dysfunction that contributes to her dyspnea when her COPD flares.  Plan: -prednisone taper again -furosemide for a week (she has not been using it) -continue Anoro -continue O2 as written  Pulmonary nodule She was not able to get the CT scan we ordered previously due to illness.  We will order a repeat CT to evaluate the nodule and will request insp/exp images to evaluate for tracheomalacia as I think that this may be contributing to her dyspnea (common in obese patients with COPD).  Hand swelling Mikaelyn has noted increasing swelling in her hands,  particularly between the MCP and PIP joints. This has limited her ability to flex her fingers. Interestingly, this symptom improved markedly while she was on prednisone. She also notes some pain in her joints in both hands. She has also noted "clicking and catching " when she bends her fingers.  She may need to be referred to rheumatology in the next few months. She did not want to do that today since we have so many other things lined up for her.    Updated Medication List Outpatient Encounter Prescriptions as of 06/14/2013  Medication Sig Dispense Refill  . ACCU-CHEK SMARTVIEW test strip TEST BLOOD SUGAR FOUR TIMES DAILY  100 each  12  . albuterol (PROVENTIL HFA) 108 (90 BASE) MCG/ACT inhaler Inhale 2 puffs into the lungs every 6 (six) hours as needed for wheezing.  2 Inhaler  3  . ALPRAZolam (XANAX) 0.25 MG tablet Take 1 tablet (0.25 mg total) by mouth at bedtime as needed.  30 tablet  2  . aspirin 81 MG EC tablet Take 81 mg by mouth daily.        Marland Kitchen atorvastatin (LIPITOR) 80 MG tablet TAKE 1 TABLET BY MOUTH EVERY DAY  30 tablet  5  . butalbital-acetaminophen-caffeine (FIORICET, ESGIC) 50-325-40 MG per tablet Take 1 tablet by mouth every 4 (four) hours as needed for headache.      . chlorpheniramine-HYDROcodone (TUSSIONEX) 10-8 MG/5ML LQCR Take 5 mLs by mouth every 12 (twelve) hours as needed.  240 mL  3  . docusate sodium (COLACE) 100 MG capsule Take 200 mg by mouth daily.      . fluconazole (DIFLUCAN) 150 MG tablet Take 1 tablet (150 mg total) by mouth once.  1 tablet  0  . furosemide (LASIX) 40 MG tablet Take 1 tablet (40 mg total) by mouth daily as needed (for leg swelling and to maintain weight at 235lbs).  30 tablet  1  . glipiZIDE (GLUCOTROL) 5 MG tablet Take 1 tablet (5 mg total) by mouth 2 (two) times daily.  60 tablet  11  . insulin glargine (LANTUS SOLOSTAR) 100 UNIT/ML injection Inject 25 Units into the skin at bedtime.  5 pen  PRN  . Insulin Syringe-Needle U-100 (INSULIN SYRINGE  .5CC/31GX5/16") 31G X 5/16" 0.5 ML MISC Test twice a day  100 each  11  . ipratropium-albuterol (DUONEB) 0.5-2.5 (3) MG/3ML SOLN Take 3 mLs by nebulization every 6 (six) hours as needed.  360 mL  11  . NOVOLOG FLEXPEN 100 UNIT/ML injection INJECT 8 UNITS SUBCUTANEOUS THREE TIMES DAILY BEFORE MEALS  15 mL  11  . pantoprazole (PROTONIX) 40 MG tablet TAKE 1 TABLET BY MOUTH TWICE DAILY  60 tablet  5  . PARoxetine (PAXIL) 10 MG tablet TAKE 1 TABLET BY MOUTH EVERY MORNING  30 tablet  2  . Potassium Gluconate 595  MG CAPS Take 2 capsules by mouth daily.       . traMADol (ULTRAM) 50 MG tablet Take 1 tablet (50 mg total) by mouth every 6 (six) hours as needed for pain (or cough).  120 tablet  1  . Umeclidinium-Vilanterol (ANORO ELLIPTA) 62.5-25 MCG/INH AEPB Inhale 1 puff into the lungs daily.  30 each  2  . [DISCONTINUED] predniSONE (DELTASONE) 10 MG tablet Start with 6 tablets on the first day and taper by 1/2 tablet daily until done.  39 tablet  0   No facility-administered encounter medications on file as of 06/14/2013.

## 2013-06-14 NOTE — Assessment & Plan Note (Signed)
This has been a rough month for Catherine [name] with a prolonged flare of COPD.  I am concerned that stopping the Daliresp contributed to this.  She tends to do better with lasix after several days of furosemide as I think that there is a component of diastolic dysfunction that contributes to her dyspnea when her COPD flares.  Plan: -prednisone taper again -furosemide for a week (she has not been using it) -continue Anoro -continue O2 as written

## 2013-06-14 NOTE — Assessment & Plan Note (Signed)
She was not able to get the CT scan we ordered previously due to illness.  We will order a repeat CT to evaluate the nodule and will request insp/exp images to evaluate for tracheomalacia as I think that this may be contributing to her dyspnea (common in obese patients with COPD).

## 2013-06-14 NOTE — Patient Instructions (Signed)
Take the prednisone taper Take lasix every day for a week We will set up a CT scan for you to evaluate the nodule in [place]  We will see you back in 4 weeks or sooner if needed

## 2013-06-15 DIAGNOSIS — M7989 Other specified soft tissue disorders: Secondary | ICD-10-CM | POA: Insufficient documentation

## 2013-06-15 NOTE — Assessment & Plan Note (Addendum)
Catherine [name] has noted increasing swelling in her hands, particularly between the MCP and PIP joints. This has limited her ability to flex her fingers. Interestingly, this symptom improved markedly while she was on prednisone. She also notes some pain in her joints in both hands. She has also noted "clicking and catching " when she bends her fingers.  She may need to be referred to rheumatology in the next few months. She did not want to do that today since we have so many other things lined up for her.

## 2013-06-21 ENCOUNTER — Ambulatory Visit (INDEPENDENT_AMBULATORY_CARE_PROVIDER_SITE_OTHER)
Admission: RE | Admit: 2013-06-21 | Discharge: 2013-06-21 | Disposition: A | Discharge status: Home / Self Care | Payer: 59 | Source: Ambulatory Visit | Attending: Pulmonary Disease | Admitting: Pulmonary Disease

## 2013-06-21 DIAGNOSIS — R911 Solitary pulmonary nodule: Secondary | ICD-10-CM

## 2013-06-22 ENCOUNTER — Encounter: Payer: Self-pay | Admitting: Pulmonary Disease

## 2013-06-23 NOTE — Progress Notes (Signed)
Quick Note:  Spoke with pt and notified of results per Dr. Wert. Pt verbalized understanding and denied any questions.  ______ 

## 2013-07-12 ENCOUNTER — Ambulatory Visit: Payer: 59 | Admitting: Pulmonary Disease

## 2013-08-08 ENCOUNTER — Other Ambulatory Visit: Payer: Self-pay | Admitting: Internal Medicine

## 2013-08-09 ENCOUNTER — Encounter: Payer: Self-pay | Admitting: Pulmonary Disease

## 2013-08-09 ENCOUNTER — Telehealth: Payer: Self-pay | Admitting: Pulmonary Disease

## 2013-08-09 ENCOUNTER — Ambulatory Visit (INDEPENDENT_AMBULATORY_CARE_PROVIDER_SITE_OTHER): Payer: 59 | Admitting: Pulmonary Disease

## 2013-08-09 VITALS — BP 134/82 | HR 102 | Temp 98.3°F | Ht 63.0 in | Wt 252.0 lb

## 2013-08-09 DIAGNOSIS — Z5181 Encounter for therapeutic drug level monitoring: Secondary | ICD-10-CM

## 2013-08-09 DIAGNOSIS — M7989 Other specified soft tissue disorders: Secondary | ICD-10-CM

## 2013-08-09 DIAGNOSIS — J441 Chronic obstructive pulmonary disease with (acute) exacerbation: Secondary | ICD-10-CM

## 2013-08-09 MED ORDER — HYDROCOD POLST-CHLORPHEN POLST 10-8 MG/5ML PO LQCR: 5.0000 mL | Freq: Two times a day (BID) | ORAL | Status: DC | PRN

## 2013-08-09 MED ORDER — PREDNISONE 10 MG PO TABS: ORAL_TABLET | ORAL | Status: DC

## 2013-08-09 MED ORDER — MOXIFLOXACIN HCL 400 MG PO TABS: 400.0000 mg | ORAL_TABLET | Freq: Every day | ORAL | Status: DC

## 2013-08-09 MED ORDER — AZITHROMYCIN 250 MG PO TABS: 250.0000 mg | ORAL_TABLET | Freq: Once | ORAL | Status: DC

## 2013-08-09 NOTE — Telephone Encounter (Signed)
BQ sent in abx for Avelox. When pt went to pick up this medication, she gave them a prescription for Zpack. Per BQ instructions, pt is to take Zpack after she finishes pred taper and Avelox. Clarification was given to [facility].

## 2013-08-09 NOTE — Addendum Note (Signed)
Addended by: Velvet Bathe on: 08/09/2013 02:36 PM   Modules accepted: Orders

## 2013-08-09 NOTE — Patient Instructions (Signed)
Take the Moxifloxacin as written (your insurance did not cover Levaquin) Take the prednisone as written After completing the prednisone, start taking the Azithromycin daily Keep taking your other medicines as written We will set up a follow up appointment with me in 6-8 weeks with blood work and an EKG

## 2013-08-09 NOTE — Assessment & Plan Note (Signed)
Lamis continues to struggle with another flare of COPD.  She did not tolerate Daliresp due to diarrhea and cost.  Plan: -Moxifloxacin 7 days -Prednisone taper -start daily azithromycin after completing prednisone (EKG today, will need quarterly CMET testing, watch for hearing loss) -continue Anoro and oxygen as written

## 2013-08-09 NOTE — Assessment & Plan Note (Addendum)
Not too bad today but she typically she swells in her feet more with prednisone tapers.  I instructed her to use her lasix for the next few days and call me if she runs out of it.

## 2013-08-09 NOTE — Progress Notes (Signed)
Subjective:    Patient ID: Catherine [name], female    DOB: Mar 08, 1956, 57 y.o.   MRN: [id number]  HPI  Catherine [name] is a 57 year old female with history of COPD, HTN, GERD, pulmonary nodule, OSA, and allergic rhinitis.   ROV >>> 04/05/13 She presents for F/U visit with no CC.  Breathing has gotten better since last visit. She states the Anoro has helped significantly and her DOE is much improved. Walking from bedroom to bathroom no longer leaves her SOB and coughing. She has noted high blood sugars since starting the Anoro (150-200 nonfasting) and some joint pain. She recently has some digital swelling, but denies trauma, numbness or motor deficits. She uses the O2 2L oxygen at night with Cpap and is doing well on that. She uses her emergency albuterol inhaler 2-3x a day and only takes her duoneb prn. She denies chest tightness, sob, and leg swelling. She states she has a "knot" on the left lower leg that has recently been hurting her, but denies any trauma to the area.  06/14/2013 ROV > Just woke up three weeks ago and felt bad, no sick contacts.  She was treated with Levaquin and prednisone, her breathing hasn't improved much.  She has been having hand swelling and her fingers have been swelling more lately.  She is taking tussionex and using albuterol 3-4 times a day. She is coughing up mucus, but the cough is mostly dry; She does feel chest congestion. She has had chills, has noted a low grade temp as high as 100.2 at home over a weeks go, none since then.  No new reflux or sinus congestoin.  08/09/2013 ROV > Catherine [name] was doing pretty well until Thanksgiving when she developed sore throat, mucus production, fever at home.  She has been exposed to sick folks at home. She continues to have left hand MCP swelling and difficulty moving it.  It has been better since the last visit.    Past Medical History  Diagnosis Date  . Diabetes mellitus   . Leukocytosis   . Hyperlipidemia   . COPD  (chronic obstructive pulmonary disease)   . Hyperglycemia   . Hypertension   . S/P cardiac catheterization March 2012    normal coronaries,  due to chest pain ([facility])  . Tobacco abuse, in remission     quit oct during hospitalization   . Screening for colon cancer June 2011    polyps found, next one due 2014 ([facility])  . Gastritis and duodenitis June 2011    EGD  . Screening for breast cancer Jul 28 2011    normal  . GERD (gastroesophageal reflux disease)   . Obesity (BMI 30-39.9)   . Depression   . Anxiety      Review of Systems  Constitutional: Positive for fever and fatigue. Negative for chills, diaphoresis and appetite change.  HENT: Positive for sinus pressure and sore throat. Negative for congestion, postnasal drip, rhinorrhea and sneezing.   Respiratory: Positive for cough and shortness of breath. Negative for choking, chest tightness and wheezing.   Cardiovascular: Negative for chest pain and leg swelling.    Objective:   Physical Exam  Filed Vitals:   08/09/13 1343  BP: 134/82  Pulse: 102  Temp: 98.3 F (36.8 C)  TempSrc: Oral  Height: 5\' 3"  (1.6 m)  Weight: 252 lb (114.306 kg)  SpO2: 96%  2L [place]  Gen: Obese, no distress HEENT: NCAT, PERRL PULM: Wheezing bilaterally CV: RRR, distant heart sounds  AB: BS+, nontender Ext: minimal ankle edema, swelling over interphalangeal bones between MCP/PIP (left hand only today), no clear joint swelling      Assessment & Plan:   COPD exacerbation Catherine [name] continues to struggle with another flare of COPD.  She did not tolerate Daliresp due to diarrhea and cost.  Plan: -Moxifloxacin 7 days -Prednisone taper -start daily azithromycin after completing prednisone (EKG today, will need quarterly CMET testing, watch for hearing loss) -continue Anoro and oxygen as written  Hand swelling This problem continues.  It improved transiently on prednisone.  Given the location and responsiveness to prednisone is makes me  consider an inflammatory connective tissue disease.  Plan: -refer to rheumatology  Leg swelling Not too bad today but she typically she swells in her feet more with prednisone tapers.  I instructed her to use her lasix for the next few days and call me if she runs out of it.    Updated Medication List Outpatient Encounter Prescriptions as of 08/09/2013  Medication Sig  . ACCU-CHEK SMARTVIEW test strip TEST BLOOD SUGAR FOUR TIMES DAILY  . albuterol (PROVENTIL HFA) 108 (90 BASE) MCG/ACT inhaler Inhale 2 puffs into the lungs every 6 (six) hours as needed for wheezing.  Marland Kitchen ALPRAZolam (XANAX) 0.25 MG tablet Take 1 tablet (0.25 mg total) by mouth at bedtime as needed.  Marland Kitchen aspirin 81 MG EC tablet Take 81 mg by mouth daily.    Marland Kitchen atorvastatin (LIPITOR) 80 MG tablet TAKE 1 TABLET BY MOUTH EVERY DAY  . butalbital-acetaminophen-caffeine (FIORICET, ESGIC) 50-325-40 MG per tablet Take 1 tablet by mouth every 4 (four) hours as needed for headache.  . chlorpheniramine-HYDROcodone (TUSSIONEX) 10-8 MG/5ML LQCR Take 5 mLs by mouth every 12 (twelve) hours as needed.  . docusate sodium (COLACE) 100 MG capsule Take 200 mg by mouth daily.  . furosemide (LASIX) 40 MG tablet Take 1 tablet (40 mg total) by mouth daily as needed (for leg swelling and to maintain weight at 235lbs).  Marland Kitchen glipiZIDE (GLUCOTROL) 5 MG tablet TAKE 1 TABLET BY MOUTH TWICE DAILY  . insulin glargine (LANTUS SOLOSTAR) 100 UNIT/ML injection Inject 25 Units into the skin at bedtime.  . Insulin Syringe-Needle U-100 (INSULIN SYRINGE .5CC/31GX5/16") 31G X 5/16" 0.5 ML MISC Test twice a day  . ipratropium-albuterol (DUONEB) 0.5-2.5 (3) MG/3ML SOLN Take 3 mLs by nebulization every 6 (six) hours as needed.  Marland Kitchen NOVOLOG FLEXPEN 100 UNIT/ML injection INJECT 8 UNITS SUBCUTANEOUS THREE TIMES DAILY BEFORE MEALS  . pantoprazole (PROTONIX) 40 MG tablet TAKE 1 TABLET BY MOUTH TWICE DAILY  . PARoxetine (PAXIL) 10 MG tablet TAKE 1 TABLET BY MOUTH EVERY MORNING  .  Potassium Gluconate 595 MG CAPS Take 2 capsules by mouth daily.   Marland Kitchen Umeclidinium-Vilanterol (ANORO ELLIPTA) 62.5-25 MCG/INH AEPB Inhale 1 puff into the lungs daily.  . [DISCONTINUED] fluconazole (DIFLUCAN) 150 MG tablet Take 1 tablet (150 mg total) by mouth once.  . [DISCONTINUED] predniSONE (DELTASONE) 10 MG tablet Take 40mg  po daily for 3 days, then take 30mg  po daily for 3 days, then take 20mg  po daily for two days, then take 10mg  po daily for 2 days  . [DISCONTINUED] traMADol (ULTRAM) 50 MG tablet Take 1 tablet (50 mg total) by mouth every 6 (six) hours as needed for pain (or cough).

## 2013-08-09 NOTE — Assessment & Plan Note (Signed)
This problem continues.  It improved transiently on prednisone.  Given the location and responsiveness to prednisone is makes me consider an inflammatory connective tissue disease.  Plan: -refer to rheumatology

## 2013-08-11 ENCOUNTER — Other Ambulatory Visit: Payer: Self-pay | Admitting: Internal Medicine

## 2013-08-11 MED ORDER — UMECLIDINIUM-VILANTEROL 62.5-25 MCG/INH IN AEPB: 1.0000 | INHALATION_SPRAY | Freq: Every day | RESPIRATORY_TRACT | Status: DC

## 2013-08-25 ENCOUNTER — Ambulatory Visit: Payer: 59

## 2013-09-05 ENCOUNTER — Other Ambulatory Visit: Payer: Self-pay | Admitting: Internal Medicine

## 2013-09-05 ENCOUNTER — Telehealth: Payer: Self-pay

## 2013-09-05 NOTE — Telephone Encounter (Signed)
OK by me 

## 2013-09-05 NOTE — Telephone Encounter (Signed)
Patient is requesting a refill on her Fluconazole 50mg  #3 take 3 po once with 0 refills.  Last filled at 07/05/12, last seen 08/09/13.  Ok to refill?

## 2013-09-06 MED ORDER — FLUCONAZOLE 50 MG PO TABS: 150.0000 mg | ORAL_TABLET | Freq: Once | ORAL | Status: DC

## 2013-09-06 NOTE — Telephone Encounter (Signed)
LMTCB X1 to make pt aware of med refill being sent in.

## 2013-09-09 NOTE — Telephone Encounter (Signed)
LMTCB X2

## 2013-09-12 NOTE — Telephone Encounter (Signed)
LMTCB X3

## 2013-09-16 ENCOUNTER — Other Ambulatory Visit: Payer: Self-pay | Admitting: Internal Medicine

## 2013-09-21 ENCOUNTER — Ambulatory Visit: Payer: 59 | Admitting: Pulmonary Disease

## 2013-09-21 ENCOUNTER — Other Ambulatory Visit: Payer: Self-pay | Admitting: Internal Medicine

## 2013-09-22 NOTE — Telephone Encounter (Signed)
Soke with pt about need for appointment with Dr. Darrick Huntsmanullo. Apt scheduled 10/07/13. Rx sent to pharmacy by escript

## 2013-10-06 ENCOUNTER — Other Ambulatory Visit: Payer: Self-pay | Admitting: Internal Medicine

## 2013-10-06 NOTE — Telephone Encounter (Signed)
Appt tomorrow.

## 2013-10-07 ENCOUNTER — Ambulatory Visit (INDEPENDENT_AMBULATORY_CARE_PROVIDER_SITE_OTHER): Payer: 59 | Admitting: Internal Medicine

## 2013-10-07 ENCOUNTER — Encounter: Payer: Self-pay | Admitting: Internal Medicine

## 2013-10-07 VITALS — BP 122/78 | HR 99 | Temp 97.8°F | Resp 20 | Ht 63.0 in | Wt 253.0 lb

## 2013-10-07 DIAGNOSIS — IMO0001 Reserved for inherently not codable concepts without codable children: Secondary | ICD-10-CM

## 2013-10-07 DIAGNOSIS — R748 Abnormal levels of other serum enzymes: Secondary | ICD-10-CM

## 2013-10-07 DIAGNOSIS — E118 Type 2 diabetes mellitus with unspecified complications: Secondary | ICD-10-CM

## 2013-10-07 DIAGNOSIS — E1165 Type 2 diabetes mellitus with hyperglycemia: Principal | ICD-10-CM

## 2013-10-07 DIAGNOSIS — E785 Hyperlipidemia, unspecified: Secondary | ICD-10-CM

## 2013-10-07 DIAGNOSIS — M129 Arthropathy, unspecified: Secondary | ICD-10-CM

## 2013-10-07 DIAGNOSIS — M1990 Unspecified osteoarthritis, unspecified site: Secondary | ICD-10-CM

## 2013-10-07 DIAGNOSIS — M7989 Other specified soft tissue disorders: Secondary | ICD-10-CM

## 2013-10-07 DIAGNOSIS — I2510 Atherosclerotic heart disease of native coronary artery without angina pectoris: Secondary | ICD-10-CM

## 2013-10-07 DIAGNOSIS — E669 Obesity, unspecified: Secondary | ICD-10-CM

## 2013-10-07 LAB — CBC WITH DIFFERENTIAL/PLATELET
Basophils Absolute: 0 10*3/uL (ref 0.0–0.1)
Basophils Relative: 0 % (ref 0–1)
Eosinophils Absolute: 0.4 10*3/uL (ref 0.0–0.7)
Eosinophils Relative: 4 % (ref 0–5)
HCT: 42.8 % (ref 36.0–46.0)
HEMOGLOBIN: 14.5 g/dL (ref 12.0–15.0)
LYMPHS ABS: 3.4 10*3/uL (ref 0.7–4.0)
LYMPHS PCT: 36 % (ref 12–46)
MCH: 28.8 pg (ref 26.0–34.0)
MCHC: 33.9 g/dL (ref 30.0–36.0)
MCV: 84.9 fL (ref 78.0–100.0)
Monocytes Absolute: 0.7 10*3/uL (ref 0.1–1.0)
Monocytes Relative: 7 % (ref 3–12)
NEUTROS PCT: 53 % (ref 43–77)
Neutro Abs: 5.1 10*3/uL (ref 1.7–7.7)
PLATELETS: 319 10*3/uL (ref 150–400)
RBC: 5.04 MIL/uL (ref 3.87–5.11)
RDW: 14.2 % (ref 11.5–15.5)
WBC: 9.5 10*3/uL (ref 4.0–10.5)

## 2013-10-07 LAB — HM DIABETES FOOT EXAM: HM Diabetic Foot Exam: NORMAL

## 2013-10-07 MED ORDER — BUTALBITAL-APAP-CAFFEINE 50-325-40 MG PO TABS: 1.0000 | ORAL_TABLET | ORAL | Status: DC | PRN

## 2013-10-07 MED ORDER — ALPRAZOLAM 0.25 MG PO TABS: 0.2500 mg | ORAL_TABLET | Freq: Every evening | ORAL | Status: DC | PRN

## 2013-10-07 MED ORDER — HYDROCOD POLST-CHLORPHEN POLST 10-8 MG/5ML PO LQCR: 5.0000 mL | Freq: Two times a day (BID) | ORAL | Status: DC | PRN

## 2013-10-07 NOTE — Progress Notes (Signed)
Patient ID: Catherine [name], female   DOB: 1956/02/03, 58 y.o.   MRN: [id number]  Patient Active Problem List   Diagnosis Date Noted  . Elevated alkaline phosphatase measurement 10/09/2013  . Hand swelling 06/15/2013  . Leg swelling 04/05/2013  . OSA (obstructive sleep apnea) 03/01/2013  . Conjunctivitis 02/04/2013  . Allergic rhinitis 02/04/2013  . Pulmonary nodule 09/15/2012  . COPD exacerbation 05/28/2012  . Cough 05/03/2012  . Chronic hypoxemic respiratory failure 05/03/2012  . COPD (chronic obstructive pulmonary disease) 04/28/2012  . Diabetes mellitus type 2, uncontrolled, with complications 11/07/2011  . GERD (gastroesophageal reflux disease)   . Obesity (BMI 30-39.9)   . Depression   . Anxiety   . Tobacco abuse, in remission   . Screening for colon cancer   . Gastritis and duodenitis   . Screening for breast cancer   . CAD (coronary artery disease) 12/02/2010  . HTN (hypertension) 12/02/2010  . Hyperlipidemia 12/02/2010    Subjective:  CC:   Chief Complaint  Patient presents with  . Follow-up    HPI:   Catherine Kahan Whiteheadis a 58 y.o. female who presents  Past Medical History  Diagnosis Date  . Diabetes mellitus   . Leukocytosis   . Hyperlipidemia   . COPD (chronic obstructive pulmonary disease)   . Hyperglycemia   . Hypertension   . S/P cardiac catheterization March 2012    normal coronaries,  due to chest pain ([facility])  . Tobacco abuse, in remission     quit oct during hospitalization   . Screening for colon cancer June 2011    polyps found, next one due 2014 ([facility])  . Gastritis and duodenitis June 2011    EGD  . Screening for breast cancer Jul 28 2011    normal  . GERD (gastroesophageal reflux disease)   . Obesity (BMI 30-39.9)   . Depression   . Anxiety     Past Surgical History  Procedure Laterality Date  . Cardiac catheterization  11/22/2010    no significant disease  . Abdominal hysterectomy    . Hemorrhoid surgery    .  Abdominal hysterectomy    . Bilateral oophorectomy  1995    and TAH.  noncancerous reasons       The following portions of the patient's history were reviewed and updated as appropriate: Allergies, current medications, and problem list.    Review of Systems:   12 Pt  review of systems was negative except those addressed in the HPI,     History   Social History  . Marital Status: Married    Spouse Name: N/A    Number of Children: 4  . Years of Education: N/A   Occupational History  . Secretary    Social History Main Topics  . Smoking status: Former Smoker -- 1.00 packs/day for 40 years    Types: Cigarettes    Quit date: 06/27/2011  . Smokeless tobacco: Never Used  . Alcohol Use: No  . Drug Use: No  . Sexual Activity: Not on file   Other Topics Concern  . Not on file   Social History Narrative  . No narrative on file    Objective:  Filed Vitals:   10/07/13 1607  BP: 122/78  Pulse: 99  Temp: 97.8 F (36.6 C)  Resp: 20     General appearance: alert, cooperative and appears stated age Ears: normal TM's and external ear canals both ears Throat: lips, mucosa, and tongue normal; teeth and gums normal Neck:  no adenopathy, no carotid bruit, supple, symmetrical, trachea midline and thyroid not enlarged, symmetric, no tenderness/mass/nodules Back: symmetric, no curvature. ROM normal. No CVA tenderness. Lungs: clear to auscultation bilaterally Heart: regular rate and rhythm, S1, S2 normal, no murmur, click, rub or gallop Abdomen: soft, non-tender; bowel sounds normal; no masses,  no organomegaly Pulses: 2+ and symmetric Skin: Skin color, texture, turgor normal. No rashes or lesions Lymph nodes: Cervical, supraclavicular, and axillary nodes normal.  Assessment and Plan:  Gastritis and duodenitis History of nsaid induced gastritis  Obesity (BMI 30-39.9) And encouraged her to continue her efforts at losing weight using my fitness spell. However I have  modified her diet to low glycemic index diet given her concurrent diabetes.  Hyperlipidemia She is  on the appropriate  statin therapya and has qui smoking. .   Liver enzymes are normal   except for mild elevation of her alkaline phosphatase which is chronic. She would need an abdominal ultrasound to evaluate her liver. Lab Results  Component Value Date   CHOL 192 12/22/2012   HDL 41.70 12/22/2012   LDLDIRECT 105.5 12/22/2012   TRIG 202.0* 12/22/2012   CHOLHDL 5 12/22/2012   Lab Results  Component Value Date   ALT 29 10/07/2013   AST 33 10/07/2013   ALKPHOS 169* 10/07/2013   BILITOT 0.5 10/07/2013     Elevated alkaline phosphatase measurement Chronic per review of prior labs. She does have a gallbladder. Given her obesity and diabetes he suspect she has fatty liver. Abdominal ultrasound will be ordered  Hand swelling She has chronic joint pain and hand swelling which improves with prednisone tapers. Comprehensive serologic workup for autoimmune disorders has been ordered.  Diabetes mellitus type 2, uncontrolled, with complications She has history of coronary artery disease which is likely accelerated by concurrent tobacco abuse years until very recently. Her diabetes is not Well-controlled on current medications, which include Lantus, glipizide, and sliding scale NovoLog. I will stop the glipizide since we will need to increase her insulin doses to have her return in 2 weeks with the blood sugar log.  hemoglobin A1c is 8.6 She is behind on eye exams and her foot exam is normal . l we'll repeat his urine microalbumin to creatinine ratio at next visit.  Lab Results  Component Value Date   HGBA1C 8.6* 10/07/2013   Lab Results  Component Value Date   MICROALBUR 0.50 10/07/2013   Lab Results  Component Value Date   CREATININE 0.66 10/07/2013     CAD (coronary artery disease) She's on the appropriate therapy with statin and aspirin. She is asymptomatic.   A total of 40 minutes was spent  with patient more than half of which was spent in counseling, reviewing records from other prviders and coordination of care.  Updated Medication List Outpatient Encounter Prescriptions as of 10/07/2013  Medication Sig  . ACCU-CHEK SMARTVIEW test strip TEST BLOOD SUGAR FOUR TIMES DAILY  . ALPRAZolam (XANAX) 0.25 MG tablet Take 1 tablet (0.25 mg total) by mouth at bedtime as needed.  Marland Kitchen. aspirin 81 MG EC tablet Take 81 mg by mouth daily.    Marland Kitchen. atorvastatin (LIPITOR) 80 MG tablet TAKE 1 TABLET BY MOUTH EVERY DAY  . butalbital-acetaminophen-caffeine (FIORICET, ESGIC) 50-325-40 MG per tablet Take 1 tablet by mouth every 4 (four) hours as needed for headache.  . chlorpheniramine-HYDROcodone (TUSSIONEX) 10-8 MG/5ML LQCR Take 5 mLs by mouth every 12 (twelve) hours as needed.  . docusate sodium (COLACE) 100 MG capsule Take 200 mg by  mouth daily.  . furosemide (LASIX) 40 MG tablet Take 1 tablet (40 mg total) by mouth daily as needed (for leg swelling and to maintain weight at 235lbs).  . insulin glargine (LANTUS SOLOSTAR) 100 UNIT/ML injection Inject 25 Units into the skin at bedtime.  . Insulin Syringe-Needle U-100 (INSULIN SYRINGE .5CC/31GX5/16") 31G X 5/16" 0.5 ML MISC Test twice a day  . ipratropium-albuterol (DUONEB) 0.5-2.5 (3) MG/3ML SOLN Take 3 mLs by nebulization every 6 (six) hours as needed.  Marland Kitchen LANTUS SOLOSTAR 100 UNIT/ML Solostar Pen INJECT 20 UNITS SUBCUTANEOUS EVERY NIGHT AT BEDTIME  . NOVOLOG FLEXPEN 100 UNIT/ML injection INJECT 8 UNITS SUBCUTANEOUS THREE TIMES DAILY BEFORE MEALS  . pantoprazole (PROTONIX) 40 MG tablet TAKE 1 TABLET BY MOUTH TWICE DAILY  . PARoxetine (PAXIL) 10 MG tablet TAKE 1 TABLET BY MOUTH EVERY MORNING  . Potassium Gluconate 595 MG CAPS Take 2 capsules by mouth daily.   Marland Kitchen Umeclidinium-Vilanterol (ANORO ELLIPTA) 62.5-25 MCG/INH AEPB Inhale 1 puff into the lungs daily.  . [DISCONTINUED] ALPRAZolam (XANAX) 0.25 MG tablet Take 1 tablet (0.25 mg total) by mouth at bedtime as  needed.  . [DISCONTINUED] ALPRAZolam (XANAX) 0.25 MG tablet Take 1 tablet (0.25 mg total) by mouth at bedtime as needed.  . [DISCONTINUED] butalbital-acetaminophen-caffeine (FIORICET, ESGIC) 50-325-40 MG per tablet Take 1 tablet by mouth every 4 (four) hours as needed for headache.  . [DISCONTINUED] chlorpheniramine-HYDROcodone (TUSSIONEX) 10-8 MG/5ML LQCR Take 5 mLs by mouth every 12 (twelve) hours as needed.  . [DISCONTINUED] glipiZIDE (GLUCOTROL) 5 MG tablet TAKE 1 TABLET BY MOUTH TWICE DAILY  . albuterol (PROVENTIL HFA) 108 (90 BASE) MCG/ACT inhaler Inhale 2 puffs into the lungs every 6 (six) hours as needed for wheezing.  . [DISCONTINUED] azithromycin (ZITHROMAX) 250 MG tablet Take 1 tablet (250 mg total) by mouth once. Take 2 tablets (500 mg) on  Day 1,  followed by 1 tablet (250 mg) once daily on Days 2 through 5.  . [DISCONTINUED] fluconazole (DIFLUCAN) 50 MG tablet Take 3 tablets (150 mg total) by mouth once. Take 3 po once  . [DISCONTINUED] moxifloxacin (AVELOX) 400 MG tablet Take 1 tablet (400 mg total) by mouth daily at 8 pm.  . [DISCONTINUED] predniSONE (DELTASONE) 10 MG tablet Take 40mg  po daily for 3 days, then take 30mg  po daily for 3 days, then take 20mg  po daily for two days, then take 10mg  po daily for 2 days     Orders Placed This Encounter  Procedures  . US Abdomen Complete  . Sedimentation rate  . Comprehensive metabolic panel  . ANA w/Reflex if Positive  . Uric acid  . Rheumatoid factor  . Cyclic citrul peptide antibody, IgG  . C-reactive protein  . Iron and TIBC  . CBC with Differential  . Hemoglobin A1c  . Microalbumin / creatinine urine ratio  . Ambulatory referral to Ophthalmology  . HM DIABETES EYE EXAM  . HM DIABETES FOOT EXAM    No Follow-up on file.

## 2013-10-07 NOTE — Patient Instructions (Signed)
This is  One version of a  "Low GI"  Diet:  It will still lower your blood sugars and allow you to lose 4 to 8  lbs  per month if you follow it carefully.  Your goal with exercise is a minimum of 30 minutes of aerobic exercise 5 days per week (Walking does not count once it becomes easy!)    All of the foods can be found at grocery stores and in bulk at [employer].  The Atkins protein bars and shakes are available in more varieties at Target, [employer]Mart and [employer].     7 AM Breakfast:  Choose from the following:  Low carbohydrate Protein  Shakes (I recommend the EAS AdvantEdge "Carb Control" shakes  Or the low carb shakes by Atkins.    2.5 carbs   Arnold's "Sandwhich Thin"toasted  w/ peanut butter (no jelly: about 20 net carbs  "Bagel Thin" with cream cheese and salmon: about 20 carbs   a scrambled egg/bacon/cheese burrito made with Mission's "carb balance" whole wheat tortilla  (about 10 net carbs )   Avoid cereal and bananas, oatmeal and cream of wheat and grits. They are loaded with carbohydrates!   10 AM: high protein snack  Protein bar by Atkins (the snack size, under 200 cal, usually < 6 net carbs).    A stick of cheese:  Around 1 carb,  100 cal     Dannon Light n Fit Greek Yogurt  (80 cal, 8 carbs)  Other so called "protein bars" and Greek yogurts tend to be loaded with carbohydrates.  Remember, in food advertising, the word "energy" is synonymous for " carbohydrate."  Lunch:   A Sandwich using the bread choices listed, Can use any  Eggs,  lunchmeat, grilled meat or canned tuna), avocado, regular mayo/mustard  and cheese.  A Salad using blue cheese, ranch,  Goddess or vinagrette,  No croutons or "confetti" and no "candied nuts" but regular nuts OK.   No pretzels or chips.  Pickles and miniature sweet peppers are a good low carb alternative that provide a "crunch"  The bread is the only source of carbohydrate in a sandwich and  can be decreased by trying some of these alternatives to  traditional loaf bread  Joseph's makes a pita bread and a flat bread that are 50 cal and 4 net carbs available at BJs and [employer].  This can be toasted to use with hummous as well  Toufayan makes a low carb flatbread that's 100 cal and 9 net carbs available at [employer] and [employer]  Mission makes 2 sizes of  Low carb whole wheat tortilla  (The large one is 210 cal and 6 net carbs) Avoid "Low fat dressings, as well as [place] and [place] dressings They are loaded with sugar!   3 PM/ Mid day  Snack:  Consider  1 ounce of  almonds, walnuts, pistachios, pecans, peanuts,  Macadamia nuts or a nut medley.  Avoid "granola"; the dried cranberries and raisins are loaded with carbohydrates. Mixed nuts as long as there are no raisins,  cranberries or dried fruit.     6 PM  Dinner:     Meat/fowl/fish with a green salad, and either broccoli, cauliflower, green beans, spinach, brussel sprouts or  Lima beans. DO NOT BREAD THE PROTEIN!!      There is a low carb pasta by Dreamfield's that is acceptable and tastes great: only 5 digestible carbs/serving.( All grocery stores but BJs carry it )    Try Michel Angelo's chicken piccata or chicken or eggplant parm over low carb pasta.(Lowes and BJs)   Aaron Sanchez's "Carnitas" (pulled pork, no sauce,  0 carbs) or his beef pot roast to make a dinner burrito (at [employer]J's)  Pesto over low carb pasta (bj's sells a good quality pesto in the center refrigerated section of the deli   Whole wheat pasta is still full of digestible carbs and  Not as low in glycemic index as Dreamfield's.   Brown rice is still rice,  So skip the rice and noodles if you eat Chinese or Thai (or at least limit to 1/2 cup)  9 PM snack :   Breyer's "low carb" fudgsicle or  ice cream bar (Carb Smart line), or  Weight Watcher's ice cream bar , or another "no sugar added" ice cream;  a serving of fresh berries/cherries with whipped cream   Cheese or DANNON'S LlGHT N FIT GREEK YOGURT  Avoid bananas,  pineapple, grapes  and watermelon on a regular basis because they are high in sugar.  THINK OF THEM AS DESSERT  Remember that snack Substitutions should be less than 10 NET carbs per serving and meals < 20 carbs. Remember to subtract fiber grams to get the "net carbs." 

## 2013-10-07 NOTE — Assessment & Plan Note (Signed)
History of nsaid induced gastritis

## 2013-10-07 NOTE — Progress Notes (Signed)
Pre-visit discussion using our clinic review tool. No additional management support is needed unless otherwise documented below in the visit note.  

## 2013-10-08 LAB — COMPREHENSIVE METABOLIC PANEL
ALT: 29 U/L (ref 0–35)
AST: 33 U/L (ref 0–37)
Albumin: 3.8 g/dL (ref 3.5–5.2)
Alkaline Phosphatase: 169 U/L — ABNORMAL HIGH (ref 39–117)
BUN: 9 mg/dL (ref 6–23)
CALCIUM: 8.5 mg/dL (ref 8.4–10.5)
CHLORIDE: 104 meq/L (ref 96–112)
CO2: 26 mEq/L (ref 19–32)
Creat: 0.66 mg/dL (ref 0.50–1.10)
Glucose, Bld: 185 mg/dL — ABNORMAL HIGH (ref 70–99)
Potassium: 3.7 mEq/L (ref 3.5–5.3)
SODIUM: 139 meq/L (ref 135–145)
Total Bilirubin: 0.5 mg/dL (ref 0.2–1.2)
Total Protein: 6.7 g/dL (ref 6.0–8.3)

## 2013-10-08 LAB — URIC ACID: Uric Acid, Serum: 4.5 mg/dL (ref 2.4–7.0)

## 2013-10-08 LAB — IRON AND TIBC
%SAT: 31 % (ref 20–55)
Iron: 91 ug/dL (ref 42–145)
TIBC: 296 ug/dL (ref 250–470)
UIBC: 205 ug/dL (ref 125–400)

## 2013-10-08 LAB — MICROALBUMIN / CREATININE URINE RATIO
CREATININE, URINE: 120.7 mg/dL
MICROALB UR: 0.5 mg/dL (ref 0.00–1.89)
Microalb Creat Ratio: 4.1 mg/g (ref 0.0–30.0)

## 2013-10-08 LAB — RHEUMATOID FACTOR

## 2013-10-08 LAB — HEMOGLOBIN A1C
HEMOGLOBIN A1C: 8.6 % — AB (ref <5.7)
MEAN PLASMA GLUCOSE: 200 mg/dL — AB (ref <117)

## 2013-10-08 LAB — C-REACTIVE PROTEIN: CRP: <0.5 mg/dL (ref <0.60)

## 2013-10-08 LAB — SEDIMENTATION RATE: Sed Rate: 19 mm/hr (ref 0–22)

## 2013-10-09 ENCOUNTER — Encounter: Payer: Self-pay | Admitting: Internal Medicine

## 2013-10-09 DIAGNOSIS — R748 Abnormal levels of other serum enzymes: Secondary | ICD-10-CM | POA: Insufficient documentation

## 2013-10-09 NOTE — Assessment & Plan Note (Signed)
And encouraged her to continue her efforts at losing weight using my fitness spell. However I have modified her diet to low glycemic index diet given her concurrent diabetes.

## 2013-10-09 NOTE — Assessment & Plan Note (Signed)
She has chronic joint pain and hand swelling which improves with prednisone tapers. Comprehensive serologic workup for autoimmune disorders has been ordered.

## 2013-10-09 NOTE — Assessment & Plan Note (Signed)
Chronic per review of prior labs. She does have a gallbladder. Given her obesity and diabetes he suspect she has fatty liver. Abdominal ultrasound will be ordered

## 2013-10-09 NOTE — Assessment & Plan Note (Addendum)
She's on the appropriate therapy with statin and aspirin. She is asymptomatic.

## 2013-10-09 NOTE — Assessment & Plan Note (Addendum)
She has history of coronary artery disease which is likely accelerated by concurrent tobacco abuse years until very recently. Her diabetes is not Well-controlled on current medications, which include Lantus, glipizide, and sliding scale NovoLog. I will stop the glipizide since we will need to increase her insulin doses to have her return in 2 weeks with the blood sugar log.  hemoglobin A1c is 8.6 She is behind on eye exams and her foot exam is normal . l we'll repeat his urine microalbumin to creatinine ratio at next visit.  Lab Results  Component Value Date   HGBA1C 8.6* 10/07/2013   Lab Results  Component Value Date   MICROALBUR 0.50 10/07/2013   Lab Results  Component Value Date   CREATININE 0.66 10/07/2013

## 2013-10-09 NOTE — Assessment & Plan Note (Addendum)
She is  on the appropriate  statin therapya and has qui smoking. .   Liver enzymes are normal   except for mild elevation of her alkaline phosphatase which is chronic. She would need an abdominal ultrasound to evaluate her liver. Lab Results  Component Value Date   CHOL 192 12/22/2012   HDL 41.70 12/22/2012   LDLDIRECT 105.5 12/22/2012   TRIG 202.0* 12/22/2012   CHOLHDL 5 12/22/2012   Lab Results  Component Value Date   ALT 29 10/07/2013   AST 33 10/07/2013   ALKPHOS 169* 10/07/2013   BILITOT 0.5 10/07/2013

## 2013-10-10 ENCOUNTER — Encounter: Payer: Self-pay | Admitting: Internal Medicine

## 2013-10-10 ENCOUNTER — Encounter: Payer: Self-pay | Admitting: Emergency Medicine

## 2013-10-10 LAB — CYCLIC CITRUL PEPTIDE ANTIBODY, IGG: Cyclic Citrullin Peptide Ab: 11.5 U/mL — ABNORMAL HIGH (ref 0.0–5.0)

## 2013-10-10 NOTE — Addendum Note (Signed)
Addended by: Sherlene ShamsULLO, Mitzie Marlar L on: 10/10/2013 01:16 PM   Modules accepted: Orders

## 2013-10-11 ENCOUNTER — Other Ambulatory Visit: Payer: Self-pay | Admitting: Internal Medicine

## 2013-10-12 ENCOUNTER — Ambulatory Visit: Payer: 59 | Admitting: Pulmonary Disease

## 2013-10-12 ENCOUNTER — Telehealth: Payer: Self-pay

## 2013-10-12 ENCOUNTER — Encounter: Payer: Self-pay | Admitting: Internal Medicine

## 2013-10-12 NOTE — Telephone Encounter (Signed)
Relevant patient education assigned to patient using Emmi. ° °

## 2013-10-14 NOTE — Telephone Encounter (Signed)
Spoke with pt, understands to increase insulin to 10 units.

## 2013-10-16 ENCOUNTER — Other Ambulatory Visit: Payer: Self-pay | Admitting: Internal Medicine

## 2013-10-17 LAB — HM DIABETES EYE EXAM

## 2013-10-18 ENCOUNTER — Other Ambulatory Visit: Payer: Self-pay | Admitting: Internal Medicine

## 2013-10-25 ENCOUNTER — Telehealth: Payer: Self-pay | Admitting: Internal Medicine

## 2013-11-03 ENCOUNTER — Other Ambulatory Visit: Payer: Self-pay | Admitting: Internal Medicine

## 2013-11-04 NOTE — Telephone Encounter (Signed)
Refill? 09/27/13 visit mentions stopping glipizide

## 2013-11-07 ENCOUNTER — Ambulatory Visit (INDEPENDENT_AMBULATORY_CARE_PROVIDER_SITE_OTHER): Payer: 59 | Admitting: Pulmonary Disease

## 2013-11-07 ENCOUNTER — Encounter: Payer: Self-pay | Admitting: Pulmonary Disease

## 2013-11-07 VITALS — BP 136/88 | Temp 98.3°F | Ht 63.0 in | Wt 253.0 lb

## 2013-11-07 DIAGNOSIS — J9611 Chronic respiratory failure with hypoxia: Secondary | ICD-10-CM

## 2013-11-07 DIAGNOSIS — R911 Solitary pulmonary nodule: Secondary | ICD-10-CM

## 2013-11-07 DIAGNOSIS — J449 Chronic obstructive pulmonary disease, unspecified: Secondary | ICD-10-CM

## 2013-11-07 DIAGNOSIS — J9610 Chronic respiratory failure, unspecified whether with hypoxia or hypercapnia: Secondary | ICD-10-CM

## 2013-11-07 DIAGNOSIS — R0902 Hypoxemia: Secondary | ICD-10-CM

## 2013-11-07 DIAGNOSIS — G4733 Obstructive sleep apnea (adult) (pediatric): Secondary | ICD-10-CM

## 2013-11-07 MED ORDER — AZITHROMYCIN 250 MG PO TABS: 250.0000 mg | ORAL_TABLET | Freq: Every day | ORAL | Status: DC

## 2013-11-07 NOTE — Assessment & Plan Note (Addendum)
We will request a download of compliance report from her CPAP machine given her recent excessive sleepiness

## 2013-11-07 NOTE — Assessment & Plan Note (Signed)
Continue 2L continuously 

## 2013-11-07 NOTE — Progress Notes (Signed)
Subjective:    Patient ID: Catherine [name], female    DOB: [birth year], 58 y.o.   MRN: [id number]  HPI  Catherine [name] is a 58 year old female with history of COPD, HTN, GERD, pulmonary nodule, OSA, and allergic rhinitis.   ROV >>> 04/05/13 She presents for F/U visit with no CC.  Breathing has gotten better since last visit. She states the Anoro has helped significantly and her DOE is much improved. Walking from bedroom to bathroom no longer leaves her SOB and coughing. She has noted high blood sugars since starting the Anoro (150-200 nonfasting) and some joint pain. She recently has some digital swelling, but denies trauma, numbness or motor deficits. She uses the O2 2L oxygen at night with Cpap and is doing well on that. She uses her emergency albuterol inhaler 2-3x a day and only takes her duoneb prn. She denies chest tightness, sob, and leg swelling. She states she has a "knot" on the left lower leg that has recently been hurting her, but denies any trauma to the area.  06/14/2013 ROV > Just woke up three weeks ago and felt bad, no sick contacts.  She was treated with Levaquin and prednisone, her breathing hasn't improved much.  She has been having hand swelling and her fingers have been swelling more lately.  She is taking tussionex and using albuterol 3-4 times a day. She is coughing up mucus, but the cough is mostly dry; She does feel chest congestion. She has had chills, has noted a low grade temp as high as 100.2 at home over a weeks go, none since then.  No new reflux or sinus congestoin.  08/09/2013 ROV > Catherine [name] was doing pretty well until Thanksgiving when she developed sore throat, mucus production, fever at home.  She has been exposed to sick folks at home. She continues to have left hand MCP swelling and difficulty moving it.  It has been better since the last visit.   11/07/2013 ROV >> Catherine [name] stated that she has been coughing since the last visit.  In the last week she has  tasted blood but she has not seen any blood.  She has had some congestion in her sinuses lately.  She has had intermittent chest tightness and soreness. She has been sleeping more for three weeks at a time.  She has acid reflux and rarely feels symptoms from this.  She continues to take her inhalers. She continues to use and benefit from her oxygen. She continues to use her CPAP machine.   Past Medical History  Diagnosis Date  . Diabetes mellitus   . Leukocytosis   . Hyperlipidemia   . COPD (chronic obstructive pulmonary disease)   . Hyperglycemia   . Hypertension   . S/P cardiac catheterization March 2012    normal coronaries,  due to chest pain ([facility])  . Tobacco abuse, in remission     quit oct during hospitalization   . Screening for colon cancer June 2011    polyps found, next one due 2014 ([facility])  . Gastritis and duodenitis June 2011    EGD  . Screening for breast cancer Jul 28 2011    normal  . GERD (gastroesophageal reflux disease)   . Obesity (BMI 30-39.9)   . Depression   . Anxiety      Review of Systems  Constitutional: Positive for fatigue. Negative for fever, chills, diaphoresis and appetite change.  HENT: Positive for sore throat. Negative for congestion, postnasal drip, rhinorrhea, sinus pressure  and sneezing.   Respiratory: Positive for cough and shortness of breath. Negative for choking, chest tightness and wheezing.   Cardiovascular: Negative for chest pain and leg swelling.    Objective:   Physical Exam  Filed Vitals:   11/07/13 1404  BP: 136/88  Temp: 98.3 F (36.8 C)  TempSrc: Oral  Height: 5\' 3"  (1.6 m)  Weight: 253 lb (114.76 kg)  2L [place]  Gen: Obese, no distress HEENT: NCAT, PERRL PULM: Good air movement, minimal wheezing CV: RRR, distant heart sounds AB: BS+, nontender Ext: minimal ankle edema,      Assessment & Plan:   COPD (chronic obstructive pulmonary disease) Unfortunately Catherine [name] never took the daily azithromycin that I  prescribed at the last visit. Again, the intention here was that we are going to try to suppress and prevent exacerbations of COPD. [facility] Med 2011; [phone]; May 02, 2010)  It has been a relatively stable interval for her but we need to get her on track with the azithromycin. She has very severe disease and exacerbations are always high-risk for hospitalization for her.  Plan: -Start daily azithromycin, will need EKG and CMET with next visit (last results were reviewed and were normal) -Continue current inhaled therapies  Chronic hypoxemic respiratory failure Continue 2 L continuously  OSA (obstructive sleep apnea) We will request a download of compliance report from her CPAP machine given her recent excessive sleepiness  Pulmonary nodule Her CT chest have shown stable nodule since December 2013. The final recommended study is December 2015.    Updated Medication List Outpatient Encounter Prescriptions as of 11/07/2013  Medication Sig  . ACCU-CHEK SMARTVIEW test strip TEST BLOOD SUGAR FOUR TIMES DAILY  . albuterol (PROVENTIL HFA) 108 (90 BASE) MCG/ACT inhaler Inhale 2 puffs into the lungs every 6 (six) hours as needed for wheezing.  Marland Kitchen ALPRAZolam (XANAX) 0.25 MG tablet Take 1 tablet (0.25 mg total) by mouth at bedtime as needed.  Marland Kitchen aspirin 81 MG EC tablet Take 81 mg by mouth daily.    Marland Kitchen atorvastatin (LIPITOR) 80 MG tablet TAKE 1 TABLET BY MOUTH EVERY DAY  . butalbital-acetaminophen-caffeine (FIORICET, ESGIC) 50-325-40 MG per tablet Take 1 tablet by mouth every 4 (four) hours as needed for headache.  . chlorpheniramine-HYDROcodone (TUSSIONEX) 10-8 MG/5ML LQCR Take 5 mLs by mouth every 12 (twelve) hours as needed.  . docusate sodium (COLACE) 100 MG capsule Take 200 mg by mouth daily.  . furosemide (LASIX) 40 MG tablet Take 1 tablet (40 mg total) by mouth daily as needed (for leg swelling and to maintain weight at 235lbs).  Marland Kitchen glipiZIDE (GLUCOTROL) 5 MG tablet TAKE 1 TABLET BY MOUTH  TWICE DAILY  . insulin aspart (NOVOLOG FLEXPEN) 100 UNIT/ML injection INJECT 10 UNITS SUBCUTANEOUS THREE TIMES DAILY BEFORE MEALS  . insulin glargine (LANTUS SOLOSTAR) 100 UNIT/ML injection Inject 25 Units into the skin at bedtime.  . Insulin Syringe-Needle U-100 (INSULIN SYRINGE .5CC/31GX5/16") 31G X 5/16" 0.5 ML MISC Test twice a day  . ipratropium-albuterol (DUONEB) 0.5-2.5 (3) MG/3ML SOLN Take 3 mLs by nebulization every 6 (six) hours as needed.  . pantoprazole (PROTONIX) 40 MG tablet TAKE 1 TABLET BY MOUTH TWICE DAILY  . PARoxetine (PAXIL) 10 MG tablet TAKE 1 TABLET BY MOUTH EVERY MORNING  . Potassium Gluconate 595 MG CAPS Take 2 capsules by mouth daily.   Marland Kitchen Umeclidinium-Vilanterol (ANORO ELLIPTA) 62.5-25 MCG/INH AEPB Inhale 1 puff into the lungs daily.  . [DISCONTINUED] NOVOLOG FLEXPEN 100 UNIT/ML injection INJECT 8 UNITS SUBCUTANEOUS  THREE TIMES DAILY BEFORE MEALS  . [DISCONTINUED] LANTUS SOLOSTAR 100 UNIT/ML Solostar Pen INJECT 20 UNITS INTO THE SKIN EVERY NIGHT AT BEDTIME.

## 2013-11-07 NOTE — Assessment & Plan Note (Signed)
Her CT chest have shown stable nodule since December 2013. The final recommended study is December 2015.

## 2013-11-07 NOTE — Assessment & Plan Note (Signed)
Unfortunately Catherine [name] never took the daily azithromycin that I prescribed at the last visit. Again, the intention here was that we are going to try to suppress and prevent exacerbations of COPD. Catherine [name](N Engl J Med 2011; [phone]; May 02, 2010)  It has been a relatively stable interval for her but we need to get her on track with the azithromycin. She has very severe disease and exacerbations are always high-risk for hospitalization for her.  Plan: -Start daily azithromycin, will need EKG and CMET with next visit (last results were reviewed and were normal) -Continue current inhaled therapies

## 2013-11-07 NOTE — Patient Instructions (Signed)
Take the azithromycin every day no matter how you feel Schedule an appointment with Dr. Mariah MillingGollan regarding your chest pain Use nasacort over the counter two sprays each nostril daily to help dry up the sinus congestion (note, it takes about a week for this medicine to work) Keep taking your medicines as you are doing

## 2013-11-10 ENCOUNTER — Encounter: Payer: Self-pay | Admitting: Cardiovascular Disease

## 2013-11-10 ENCOUNTER — Ambulatory Visit (INDEPENDENT_AMBULATORY_CARE_PROVIDER_SITE_OTHER): Payer: 59 | Admitting: Cardiovascular Disease

## 2013-11-10 VITALS — BP 140/80 | HR 99 | Ht 63.0 in | Wt 258.2 lb

## 2013-11-10 DIAGNOSIS — I2510 Atherosclerotic heart disease of native coronary artery without angina pectoris: Secondary | ICD-10-CM

## 2013-11-10 DIAGNOSIS — R0602 Shortness of breath: Secondary | ICD-10-CM

## 2013-11-10 DIAGNOSIS — IMO0002 Reserved for concepts with insufficient information to code with codable children: Secondary | ICD-10-CM

## 2013-11-10 DIAGNOSIS — E1165 Type 2 diabetes mellitus with hyperglycemia: Secondary | ICD-10-CM

## 2013-11-10 DIAGNOSIS — R079 Chest pain, unspecified: Secondary | ICD-10-CM

## 2013-11-10 DIAGNOSIS — J449 Chronic obstructive pulmonary disease, unspecified: Secondary | ICD-10-CM

## 2013-11-10 DIAGNOSIS — E785 Hyperlipidemia, unspecified: Secondary | ICD-10-CM

## 2013-11-10 DIAGNOSIS — M7989 Other specified soft tissue disorders: Secondary | ICD-10-CM

## 2013-11-10 DIAGNOSIS — E118 Type 2 diabetes mellitus with unspecified complications: Secondary | ICD-10-CM

## 2013-11-10 DIAGNOSIS — J4489 Other specified chronic obstructive pulmonary disease: Secondary | ICD-10-CM

## 2013-11-10 DIAGNOSIS — I10 Essential (primary) hypertension: Secondary | ICD-10-CM

## 2013-11-10 NOTE — Assessment & Plan Note (Addendum)
She has followup with Dr. Kendrick FriesMcQuaid. Recommended she discuss with him whether she might benefit from a portable oxygen tank with continuous flow oxygen that she can titrate as needed particularly for periods of exertion when she is most short of breath

## 2013-11-10 NOTE — Patient Instructions (Signed)
You are doing well. No medication changes were made.  Work hard to get sugars down  Take lasix for weight gain, leg swelling Take with potassium  Please call us if you have new issues that need to be addressed before your next appt.  Your physician wants you to follow-up in: 6 months.  You will receive a reminder letter in the mail two months in advance. If you don't receive a letter, please call our office to schedule the follow-up appointment.

## 2013-11-10 NOTE — Assessment & Plan Note (Signed)
Trace leg edema. Recommended she take Lasix periodically

## 2013-11-10 NOTE — Progress Notes (Signed)
Patient ID: Catherine [name], female    DOB: [birth year], 58 y.o.   MRN: [id number]  HPI Comments: Catherine [name] is a very pleasant 58 yo woman with COPD, hyperlipidemia, hypertension, long smoking history stopped 06/2011, who works at [employer] who presented to the hospital with neck and shoulder pain in 2012, elevated cardiac enzymes, status post cardiac catheterization who presents for routine followup.  cardiac catheterization while in the hospital on November 22 2010 showing mild diffuse atherosclerotic disease with tapering of the mid to distal LAD from a moderate-sized vessel to a small vessel distally, she had several small to moderate sized OM vessels, ejection fraction 55%, no disease in the runoff of the right external iliac and right common femoral.  In followup today, she reports that she continues to have chronic moderate shortness of breath. She is unable to walk very far secondary to shortness of breath symptoms. This is not a new issue. She is on nasal cannula oxygen, 2 L, though this is not continuous flow. Weight has been high from poor eating habits, and prednisone. Hemoglobin A1c has climbed up to 8.6. Most recent total cholesterol 192 though she reports her sugars have improved recently and she's not on prednisone currently. She denies any significant chest pain concerning for angina. No significant leg swelling. She takes Lasix as needed  EKG today shows normal sinus rhythm with rate 99 beats per minute, no significant ST or T wave changes.         Outpatient Encounter Prescriptions as of 11/10/2013  Medication Sig  . ACCU-CHEK SMARTVIEW test strip TEST BLOOD SUGAR FOUR TIMES DAILY  . albuterol (PROVENTIL HFA) 108 (90 BASE) MCG/ACT inhaler Inhale 2 puffs into the lungs every 6 (six) hours as needed for wheezing.  Marland Kitchen ALPRAZolam (XANAX) 0.25 MG tablet Take 1 tablet (0.25 mg total) by mouth at bedtime as needed.  Marland Kitchen aspirin 81 MG EC tablet Take 81 mg by mouth daily.    Marland Kitchen  atorvastatin (LIPITOR) 80 MG tablet TAKE 1 TABLET BY MOUTH EVERY DAY  . azithromycin (ZITHROMAX) 250 MG tablet Take 1 tablet (250 mg total) by mouth daily.  . butalbital-acetaminophen-caffeine (FIORICET, ESGIC) 50-325-40 MG per tablet Take 1 tablet by mouth every 4 (four) hours as needed for headache.  . chlorpheniramine-HYDROcodone (TUSSIONEX) 10-8 MG/5ML LQCR Take 5 mLs by mouth every 12 (twelve) hours as needed.  . docusate sodium (COLACE) 100 MG capsule Take 200 mg by mouth daily.  . furosemide (LASIX) 40 MG tablet Take 1 tablet (40 mg total) by mouth daily as needed (for leg swelling and to maintain weight at 235lbs).  Marland Kitchen glipiZIDE (GLUCOTROL) 5 MG tablet TAKE 1 TABLET BY MOUTH TWICE DAILY  . insulin aspart (NOVOLOG FLEXPEN) 100 UNIT/ML injection INJECT 10 UNITS SUBCUTANEOUS THREE TIMES DAILY BEFORE MEALS  . insulin glargine (LANTUS SOLOSTAR) 100 UNIT/ML injection Inject 25 Units into the skin at bedtime.  . Insulin Syringe-Needle U-100 (INSULIN SYRINGE .5CC/31GX5/16") 31G X 5/16" 0.5 ML MISC Test twice a day  . ipratropium-albuterol (DUONEB) 0.5-2.5 (3) MG/3ML SOLN Take 3 mLs by nebulization every 6 (six) hours as needed.  . pantoprazole (PROTONIX) 40 MG tablet TAKE 1 TABLET BY MOUTH TWICE DAILY  . PARoxetine (PAXIL) 10 MG tablet TAKE 1 TABLET BY MOUTH EVERY MORNING  . Potassium Gluconate 595 MG CAPS Take 2 capsules by mouth daily.   Marland Kitchen Umeclidinium-Vilanterol (ANORO ELLIPTA) 62.5-25 MCG/INH AEPB Inhale 1 puff into the lungs daily.    Review of Systems  Constitutional: Negative.  HENT: Negative.   Eyes: Negative.   Respiratory: Positive for shortness of breath.   Cardiovascular: Negative.   Gastrointestinal: Negative.   Endocrine: Negative.   Musculoskeletal: Negative.   Skin: Negative.   Allergic/Immunologic: Negative.   Neurological: Negative.   Hematological: Negative.   Psychiatric/Behavioral: Negative.   All other systems reviewed and are negative.    BP 140/80  Pulse 99   Ht 5\' 3"  (1.6 m)  Wt 258 lb 4 oz (117.141 kg)  BMI 45.76 kg/m2  Physical Exam  Nursing note and vitals reviewed. Constitutional: She is oriented to person, place, and time. She appears well-developed and well-nourished.  Obese  HENT:  Head: Normocephalic.  Nose: Nose normal.  Mouth/Throat: Oropharynx is clear and moist.  Eyes: Conjunctivae are normal. Pupils are equal, round, and reactive to light.  Neck: Normal range of motion. Neck supple. No JVD present.  Cardiovascular: Normal rate, regular rhythm, S1 normal, S2 normal, normal heart sounds and intact distal pulses.  Exam reveals no gallop and no friction rub.   No murmur heard. Trace edema  Pulmonary/Chest: Effort normal. No respiratory distress. She has decreased breath sounds. She has no wheezes. She has no rales. She exhibits no tenderness.  Abdominal: Soft. Bowel sounds are normal. She exhibits no distension. There is no tenderness.  Musculoskeletal: Normal range of motion. She exhibits no edema and no tenderness.  Lymphadenopathy:    She has no cervical adenopathy.  Neurological: She is alert and oriented to person, place, and time. Coordination normal.  Skin: Skin is warm and dry. No rash noted. No erythema.  Psychiatric: She has a normal mood and affect. Her behavior is normal. Judgment and thought content normal.    Assessment and Plan

## 2013-11-10 NOTE — Assessment & Plan Note (Signed)
We have encouraged continued exercise, careful diet management in an effort to lose weight. 

## 2013-11-10 NOTE — Assessment & Plan Note (Signed)
Currently with no symptoms of angina. No further workup at this time. Continue current medication regimen. He will be difficult to determine if she has worsening CAD given severe underlying shortness of breath from respiratory issues. We have recommended she call our office for any change in her current baseline.

## 2013-11-10 NOTE — Assessment & Plan Note (Signed)
Blood pressure is well controlled on today's visit. No changes made to the medications. 

## 2013-11-10 NOTE — Assessment & Plan Note (Signed)
Cholesterol above goal. She is unable to afford Crestor or zetia. Recommended better diabetes control

## 2013-11-14 ENCOUNTER — Other Ambulatory Visit: Payer: Self-pay | Admitting: Internal Medicine

## 2013-12-12 ENCOUNTER — Other Ambulatory Visit: Payer: Self-pay | Admitting: Internal Medicine

## 2014-01-12 ENCOUNTER — Ambulatory Visit: Payer: Self-pay

## 2014-01-23 ENCOUNTER — Encounter: Payer: Self-pay | Admitting: Internal Medicine

## 2014-01-23 ENCOUNTER — Other Ambulatory Visit: Payer: Self-pay | Admitting: Internal Medicine

## 2014-01-23 ENCOUNTER — Ambulatory Visit (INDEPENDENT_AMBULATORY_CARE_PROVIDER_SITE_OTHER): Payer: 59 | Admitting: Internal Medicine

## 2014-01-23 VITALS — BP 118/72 | HR 96 | Temp 98.2°F | Resp 18 | Wt 258.2 lb

## 2014-01-23 DIAGNOSIS — J441 Chronic obstructive pulmonary disease with (acute) exacerbation: Secondary | ICD-10-CM

## 2014-01-23 MED ORDER — HYDROCOD POLST-CHLORPHEN POLST 10-8 MG/5ML PO LQCR: 5.0000 mL | Freq: Two times a day (BID) | ORAL | Status: DC | PRN

## 2014-01-23 MED ORDER — LEVOFLOXACIN 500 MG PO TABS: 500.0000 mg | ORAL_TABLET | Freq: Every day | ORAL | Status: DC

## 2014-01-23 MED ORDER — PREDNISONE 10 MG PO TABS: ORAL_TABLET | ORAL | Status: DC

## 2014-01-23 NOTE — Assessment & Plan Note (Addendum)
12 day steroid taper , levaquin and tussionex. Suspend azithromycinf for 10 days,  Return on Friday for recheck.

## 2014-01-23 NOTE — Progress Notes (Signed)
Pre-visit discussion using our clinic review tool. No additional management support is needed unless otherwise documented below in the visit note.  

## 2014-01-23 NOTE — Progress Notes (Signed)
Patient ID: Catherine [name], female   DOB: August 29, 1956, 58 y.o.   MRN: [id number]   Patient Active Problem List   Diagnosis Date Noted  . Elevated alkaline phosphatase measurement 10/09/2013  . Hand swelling 06/15/2013  . Leg swelling 04/05/2013  . OSA (obstructive sleep apnea) 03/01/2013  . Conjunctivitis 02/04/2013  . Allergic rhinitis 02/04/2013  . Pulmonary nodule 09/15/2012  . COPD exacerbation 05/28/2012  . Cough 05/03/2012  . Chronic hypoxemic respiratory failure 05/03/2012  . COPD (chronic obstructive pulmonary disease) 04/28/2012  . Diabetes mellitus type 2, uncontrolled, with complications 11/07/2011  . GERD (gastroesophageal reflux disease)   . Obesity (BMI 30-39.9)   . Depression   . Anxiety   . Tobacco abuse, in remission   . Screening for colon cancer   . Gastritis and duodenitis   . Screening for breast cancer   . CAD (coronary artery disease) 12/02/2010  . HTN (hypertension) 12/02/2010  . Hyperlipidemia 12/02/2010    Subjective:  CC:   Chief Complaint  Patient presents with  . Cough    runny nose, stuffy nose, mucus yellow and brown.    HPI:   Catherine [name] is a 58 y.o. female who presents for Cough productive of yellow sputum accompanied by increased dyspnea and wheezing. Since her last visit she has been taking daily abx, prescribed daily azithromycin by pulmonary in march for suppression /prevention of COPD exacerbations.  No diarrhea.  Has had recent sick contacts  Son,  Started feelin g bad on Saturday,  Started with sore throat on Friuday and cough started coughing ,  Now has sinus congestion with rhinitis.   No headache or facial pain .  Using duonebs back to back since Saturday.  Used duoneb just prior to visit tonight.     Past Medical History  Diagnosis Date  . Diabetes mellitus   . Leukocytosis   . Hyperlipidemia   . COPD (chronic obstructive pulmonary disease)   . Hyperglycemia   . Hypertension   . S/P cardiac catheterization  March 2012    normal coronaries,  due to chest pain ([facility])  . Tobacco abuse, in remission     quit oct during hospitalization   . Screening for colon cancer June 2011    polyps found, next one due 2014 ([facility])  . Gastritis and duodenitis June 2011    EGD  . Screening for breast cancer Jul 28 2011    normal  . GERD (gastroesophageal reflux disease)   . Obesity (BMI 30-39.9)   . Depression   . Anxiety     Past Surgical History  Procedure Laterality Date  . Cardiac catheterization  11/22/2010    no significant disease  . Abdominal hysterectomy    . Hemorrhoid surgery    . Abdominal hysterectomy    . Bilateral oophorectomy  1995    and TAH.  noncancerous reasons       The following portions of the patient's history were reviewed and updated as appropriate: Allergies, current medications, and problem list.    Review of Systems:   Patient denies headache, fevers, malaise, unintentional weight loss, skin rash, eye pain, sinus congestion and sinus pain, sore throat, dysphagia,  hemoptysis , cough, dyspnea, wheezing, chest pain, palpitations, orthopnea, edema, abdominal pain, nausea, melena, diarrhea, constipation, flank pain, dysuria, hematuria, urinary  Frequency, nocturia, numbness, tingling, seizures,  Focal weakness, Loss of consciousness,  Tremor, insomnia, depression, anxiety, and suicidal ideation.     History   Social History  . Marital  Status: Married    Spouse Name: N/A    Number of Children: 4  . Years of Education: N/A   Occupational History  . Secretary    Social History Main Topics  . Smoking status: Former Smoker -- 1.00 packs/day for 40 years    Types: Cigarettes    Quit date: 06/27/2011  . Smokeless tobacco: Never Used  . Alcohol Use: No  . Drug Use: No  . Sexual Activity: Not on file   Other Topics Concern  . Not on file   Social History Narrative  . No narrative on file    Objective:  Filed Vitals:   01/23/14 1845  BP: 118/72   Pulse: 96  Temp: 98.2 F (36.8 C)  Resp: 18     General appearance: alert, cooperative and appears stated age Ears: normal TM's and external ear canals both ears Throat: lips, mucosa, and tongue normal; teeth and gums normal Neck: no adenopathy, no carotid bruit, supple, symmetrical, trachea midline and thyroid not enlarged, symmetric, no tenderness/mass/nodules Back: symmetric, no curvature. ROM normal. No CVA tenderness. Lungs: clear to auscultation bilaterally Heart: regular rate and rhythm, S1, S2 normal, no murmur, click, rub or gallop Abdomen: soft, non-tender; bowel sounds normal; no masses,  no organomegaly Pulses: 2+ and symmetric Skin: Skin color, texture, turgor normal. No rashes or lesions Lymph nodes: Cervical, supraclavicular, and axillary nodes normal.  Assessment and Plan:  COPD exacerbation 12 day steroid taper , levaquin and tussionex. Suspend azithromycinf for 10 days,  Return on Friday for recheck.    Updated Medication List Outpatient Encounter Prescriptions as of 01/23/2014  Medication Sig  . ACCU-CHEK SMARTVIEW test strip TEST BLOOD SUGAR FOUR TIMES DAILY  . ALPRAZolam (XANAX) 0.25 MG tablet Take 1 tablet (0.25 mg total) by mouth at bedtime as needed.  Marland Kitchen aspirin 81 MG EC tablet Take 81 mg by mouth daily.    Marland Kitchen atorvastatin (LIPITOR) 80 MG tablet TAKE 1 TABLET BY MOUTH EVERY DAY  . azithromycin (ZITHROMAX) 250 MG tablet Take 1 tablet (250 mg total) by mouth daily.  . butalbital-acetaminophen-caffeine (FIORICET, ESGIC) 50-325-40 MG per tablet Take 1 tablet by mouth every 4 (four) hours as needed for headache.  . docusate sodium (COLACE) 100 MG capsule Take 200 mg by mouth daily.  . furosemide (LASIX) 40 MG tablet TAKE 1 TABLET BY MOUTH EVERY DAY AS NEEDED FOR LEG SWELLING AND TO MAINTAIN WEIGHT.  Marland Kitchen glipiZIDE (GLUCOTROL) 5 MG tablet TAKE 1 TABLET BY MOUTH TWICE DAILY  . insulin aspart (NOVOLOG FLEXPEN) 100 UNIT/ML injection INJECT 10 UNITS SUBCUTANEOUS THREE  TIMES DAILY BEFORE MEALS  . insulin glargine (LANTUS SOLOSTAR) 100 UNIT/ML injection Inject 25 Units into the skin at bedtime.  . Insulin Syringe-Needle U-100 (INSULIN SYRINGE .5CC/31GX5/16") 31G X 5/16" 0.5 ML MISC Test twice a day  . ipratropium-albuterol (DUONEB) 0.5-2.5 (3) MG/3ML SOLN INHALE CONTENTS OF 1 VIAL VIA NEBULIZER EVERY 6 HOURS AS NEEDED  . latanoprost (XALATAN) 0.005 % ophthalmic solution Place 1 drop into both eyes at bedtime.  . pantoprazole (PROTONIX) 40 MG tablet TAKE 1 TABLET BY MOUTH TWICE DAILY  . PARoxetine (PAXIL) 10 MG tablet TAKE 1 TABLET BY MOUTH EVERY MORNING  . Potassium Gluconate 595 MG CAPS Take 2 capsules by mouth daily.   Marland Kitchen Umeclidinium-Vilanterol (ANORO ELLIPTA) 62.5-25 MCG/INH AEPB Inhale 1 puff into the lungs daily.  Marland Kitchen albuterol (PROVENTIL HFA) 108 (90 BASE) MCG/ACT inhaler Inhale 2 puffs into the lungs every 6 (six) hours as needed for wheezing.  Marland Kitchen  chlorpheniramine-HYDROcodone (TUSSIONEX) 10-8 MG/5ML LQCR Take 5 mLs by mouth every 12 (twelve) hours as needed.  Marland Kitchen. levofloxacin (LEVAQUIN) 500 MG tablet Take 1 tablet (500 mg total) by mouth daily.  . predniSONE (DELTASONE) 10 MG tablet 6 tablets daily for 2 days,  Decrease by 1 tablet every 2 days until gone (12 day taper)  . [DISCONTINUED] chlorpheniramine-HYDROcodone (TUSSIONEX) 10-8 MG/5ML LQCR Take 5 mLs by mouth every 12 (twelve) hours as needed.     No orders of the defined types were placed in this encounter.    No Follow-up on file.

## 2014-01-23 NOTE — Patient Instructions (Signed)
You are having a COPD exacerbation If you become more short of breath than you are now,  You need to go to the nearest ER immediately or call 911  I will try to treat you at home with the following:  Prednisone:  60 mg daily for 2 days,  Then 50 mg daily for 2 days, then continue taper by 1 tablet every 2 days  until gone Levaquin 1 tablet daily for 7 days  Return on Friday to make sure you are improving

## 2014-01-27 ENCOUNTER — Encounter: Payer: Self-pay | Admitting: Internal Medicine

## 2014-01-27 ENCOUNTER — Ambulatory Visit (INDEPENDENT_AMBULATORY_CARE_PROVIDER_SITE_OTHER): Payer: 59 | Admitting: Internal Medicine

## 2014-01-27 VITALS — BP 126/76 | HR 102 | Temp 97.9°F | Resp 20 | Ht 63.0 in

## 2014-01-27 DIAGNOSIS — J441 Chronic obstructive pulmonary disease with (acute) exacerbation: Secondary | ICD-10-CM

## 2014-01-27 MED ORDER — HYDROCOD POLST-CHLORPHEN POLST 10-8 MG/5ML PO LQCR: 10.0000 mL | Freq: Two times a day (BID) | ORAL | Status: DC | PRN

## 2014-01-27 MED ORDER — BENZONATATE 200 MG PO CAPS: 200.0000 mg | ORAL_CAPSULE | Freq: Three times a day (TID) | ORAL | Status: DC | PRN

## 2014-01-27 MED ORDER — PREDNISONE (PAK) 10 MG PO TABS: ORAL_TABLET | ORAL | Status: DC

## 2014-01-27 NOTE — Progress Notes (Signed)
Patient ID: Catherine [name], female   DOB: [birth year], 58 y.o.   MRN: [id number]   Patient Active Problem List   Diagnosis Date Noted  . Elevated alkaline phosphatase measurement 10/09/2013  . Hand swelling 06/15/2013  . Leg swelling 04/05/2013  . OSA (obstructive sleep apnea) 03/01/2013  . Conjunctivitis 02/04/2013  . Allergic rhinitis 02/04/2013  . Pulmonary nodule 09/15/2012  . COPD exacerbation 05/28/2012  . Cough 05/03/2012  . Chronic hypoxemic respiratory failure 05/03/2012  . COPD (chronic obstructive pulmonary disease) 04/28/2012  . Diabetes mellitus type 2, uncontrolled, with complications 11/07/2011  . GERD (gastroesophageal reflux disease)   . Obesity (BMI 30-39.9)   . Depression   . Anxiety   . Tobacco abuse, in remission   . Screening for colon cancer   . Gastritis and duodenitis   . Screening for breast cancer   . CAD (coronary artery disease) 12/02/2010  . HTN (hypertension) 12/02/2010  . Hyperlipidemia 12/02/2010    Subjective:  CC:   Chief Complaint  Patient presents with  . Follow-up    COPD followup, symptoms unchanged    HPI:   Catherine [name] is a 58 y.o. female who presents for Follow up on COPD exacerbation.  Seen on Monday,  Started the prednisone 60 mg daily on Tuesday.  Ambulatory sats today in office on 3 L02 dropped transiently to 92% and patient short of breath with exertion with minimal ambulation   Has lost voice,  Cough is not controlled with 5 m tussionex every 12 hours,  Has been using it every 6 hours., sputum is clear,  Sinuses still congested and has a lot of thick PND   Past Medical History  Diagnosis Date  . Diabetes mellitus   . Leukocytosis   . Hyperlipidemia   . COPD (chronic obstructive pulmonary disease)   . Hyperglycemia   . Hypertension   . S/P cardiac catheterization March 2012    normal coronaries,  due to chest pain ([place])  . Tobacco abuse, in remission     quit oct during hospitalization   .  Screening for colon cancer June 2011    polyps found, next one due 2014 ([place])  . Gastritis and duodenitis June 2011    EGD  . Screening for breast cancer Jul 28 2011    normal  . GERD (gastroesophageal reflux disease)   . Obesity (BMI 30-39.9)   . Depression   . Anxiety     Past Surgical History  Procedure Laterality Date  . Cardiac catheterization  11/22/2010    no significant disease  . Abdominal hysterectomy    . Hemorrhoid surgery    . Abdominal hysterectomy    . Bilateral oophorectomy  1995    and TAH.  noncancerous reasons       The following portions of the patient's history were reviewed and updated as appropriate: Allergies, current medications, and problem list.    Review of Systems:   Patient denies headache, fevers, malaise, unintentional weight loss, skin rash, eye pain, sinus congestion and sinus pain, sore throat, dysphagia,  hemoptysis , cough, dyspnea, wheezing, chest pain, palpitations, orthopnea, edema, abdominal pain, nausea, melena, diarrhea, constipation, flank pain, dysuria, hematuria, urinary  Frequency, nocturia, numbness, tingling, seizures,  Focal weakness, Loss of consciousness,  Tremor, insomnia, depression, anxiety, and suicidal ideation.     History   Social History  . Marital Status: Married    Spouse Name: N/A    Number of Children: 4  . Years of Education: N/A  Occupational History  . Secretary    Social History Main Topics  . Smoking status: Former Smoker -- 1.00 packs/day for 40 years    Types: Cigarettes    Quit date: 06/27/2011  . Smokeless tobacco: Never Used  . Alcohol Use: No  . Drug Use: No  . Sexual Activity: Not on file   Other Topics Concern  . Not on file   Social History Narrative  . No narrative on file    Objective:  Filed Vitals:   01/27/14 1022  BP: 126/76  Pulse: 102  Temp: 97.9 F (36.6 C)  Resp: 20     General appearance: alert, cooperative and appears stated age Ears: normal TM's and  external ear canals both ears Throat: lips, mucosa, and tongue normal; teeth and gums normal Neck: no adenopathy, no carotid bruit, supple, symmetrical, trachea midline and thyroid not enlarged, symmetric, no tenderness/mass/nodules Back: symmetric, no curvature. ROM normal. No CVA tenderness. Lungs: clear to auscultation bilaterally Heart: regular rate and rhythm, S1, S2 normal, no murmur, click, rub or gallop Abdomen: soft, non-tender; bowel sounds normal; no masses,  no organomegaly Pulses: 2+ and symmetric Skin: Skin color, texture, turgor normal. No rashes or lesions Lymph nodes: Cervical, supraclavicular, and axillary nodes normal.  Assessment and Plan:  COPD exacerbation 3 day follow up is as expected given her long history   Of chronic respiratoy failure.  Will provide more aggressive cough suppression and lengthen steroid taper.    Updated Medication List Outpatient Encounter Prescriptions as of 01/27/2014  Medication Sig  . ACCU-CHEK SMARTVIEW test strip TEST BLOOD SUGAR FOUR TIMES DAILY  . ALPRAZolam (XANAX) 0.25 MG tablet Take 1 tablet (0.25 mg total) by mouth at bedtime as needed.  Marland Kitchen. aspirin 81 MG EC tablet Take 81 mg by mouth daily.    Marland Kitchen. atorvastatin (LIPITOR) 80 MG tablet TAKE 1 TABLET BY MOUTH EVERY DAY  . butalbital-acetaminophen-caffeine (FIORICET, ESGIC) 50-325-40 MG per tablet Take 1 tablet by mouth every 4 (four) hours as needed for headache.  . chlorpheniramine-HYDROcodone (TUSSIONEX) 10-8 MG/5ML LQCR Take 10 mLs by mouth every 12 (twelve) hours as needed for cough.  . docusate sodium (COLACE) 100 MG capsule Take 200 mg by mouth daily.  . furosemide (LASIX) 40 MG tablet TAKE 1 TABLET BY MOUTH EVERY DAY AS NEEDED FOR LEG SWELLING AND TO MAINTAIN WEIGHT.  Marland Kitchen. glipiZIDE (GLUCOTROL) 5 MG tablet TAKE 1 TABLET BY MOUTH TWICE DAILY  . insulin aspart (NOVOLOG FLEXPEN) 100 UNIT/ML FlexPen Inject 10 units under the skin three times daily before meals  . insulin glargine (LANTUS  SOLOSTAR) 100 UNIT/ML injection Inject 25 Units into the skin at bedtime.  . Insulin Syringe-Needle U-100 (INSULIN SYRINGE .5CC/31GX5/16") 31G X 5/16" 0.5 ML MISC Test twice a day  . ipratropium-albuterol (DUONEB) 0.5-2.5 (3) MG/3ML SOLN INHALE CONTENTS OF 1 VIAL VIA NEBULIZER EVERY 6 HOURS AS NEEDED  . latanoprost (XALATAN) 0.005 % ophthalmic solution Place 1 drop into both eyes at bedtime.  Marland Kitchen. levofloxacin (LEVAQUIN) 500 MG tablet Take 1 tablet (500 mg total) by mouth daily.  . pantoprazole (PROTONIX) 40 MG tablet TAKE 1 TABLET BY MOUTH TWICE DAILY  . PARoxetine (PAXIL) 10 MG tablet TAKE 1 TABLET BY MOUTH EVERY MORNING  . Potassium Gluconate 595 MG CAPS Take 2 capsules by mouth daily.   . predniSONE (DELTASONE) 10 MG tablet 6 tablets daily for 2 days,  Decrease by 1 tablet every 2 days until gone (12 day taper)  . Umeclidinium-Vilanterol (ANORO ELLIPTA) 62.5-25  MCG/INH AEPB Inhale 1 puff into the lungs daily.  . [DISCONTINUED] chlorpheniramine-HYDROcodone (TUSSIONEX) 10-8 MG/5ML LQCR Take 5 mLs by mouth every 12 (twelve) hours as needed.  Marland Kitchen albuterol (PROVENTIL HFA) 108 (90 BASE) MCG/ACT inhaler Inhale 2 puffs into the lungs every 6 (six) hours as needed for wheezing.  Marland Kitchen azithromycin (ZITHROMAX) 250 MG tablet Take 1 tablet (250 mg total) by mouth daily.  . benzonatate (TESSALON) 200 MG capsule Take 1 capsule (200 mg total) by mouth 3 (three) times daily as needed for cough.  . predniSONE (STERAPRED UNI-PAK) 10 MG tablet 6 tablets daily for 5 days  . [DISCONTINUED] insulin aspart (NOVOLOG FLEXPEN) 100 UNIT/ML injection INJECT 10 UNITS SUBCUTANEOUS THREE TIMES DAILY BEFORE MEALS     No orders of the defined types were placed in this encounter.    No Follow-up on file.

## 2014-01-27 NOTE — Progress Notes (Signed)
Pre visit review using our clinic review tool, if applicable. No additional management support is needed unless otherwise documented below in the visit note. 

## 2014-01-27 NOTE — Patient Instructions (Signed)
Try Neil's sinus rinse to help clear te thick mucus  at the back of your throat   Adding tessalon perles to help quiet the cough every 8 hours  Can increase the cough medication to 10 ml every 8 hours if needed   Go back to 60 mg daily of prednisone through Saturday

## 2014-01-29 NOTE — Assessment & Plan Note (Signed)
3 day follow up is as expected given her long history   Of chronic respiratoy failure.  Will provide more aggressive cough suppression and lengthen steroid taper.

## 2014-03-09 ENCOUNTER — Other Ambulatory Visit: Payer: Self-pay

## 2014-03-09 MED ORDER — UMECLIDINIUM-VILANTEROL 62.5-25 MCG/INH IN AEPB: 1.0000 | INHALATION_SPRAY | Freq: Every day | RESPIRATORY_TRACT | Status: DC

## 2014-03-20 ENCOUNTER — Other Ambulatory Visit: Payer: Self-pay

## 2014-03-20 MED ORDER — AZITHROMYCIN 250 MG PO TABS: 250.0000 mg | ORAL_TABLET | Freq: Every day | ORAL | Status: DC

## 2014-03-31 ENCOUNTER — Other Ambulatory Visit: Payer: Self-pay | Admitting: Internal Medicine

## 2014-04-17 ENCOUNTER — Other Ambulatory Visit: Payer: Self-pay | Admitting: Internal Medicine

## 2014-05-11 ENCOUNTER — Ambulatory Visit: Payer: 59 | Admitting: Pulmonary Disease

## 2014-05-18 ENCOUNTER — Other Ambulatory Visit: Payer: Self-pay | Admitting: Internal Medicine

## 2014-05-26 ENCOUNTER — Other Ambulatory Visit: Payer: Self-pay | Admitting: Internal Medicine

## 2014-06-03 ENCOUNTER — Ambulatory Visit: Payer: Self-pay | Admitting: Family Medicine

## 2014-06-12 ENCOUNTER — Ambulatory Visit (INDEPENDENT_AMBULATORY_CARE_PROVIDER_SITE_OTHER): Payer: 59 | Admitting: Internal Medicine

## 2014-06-12 ENCOUNTER — Encounter: Payer: Self-pay | Admitting: Internal Medicine

## 2014-06-12 VITALS — BP 136/76 | HR 88 | Temp 97.6°F | Resp 18 | Ht 63.0 in | Wt 261.8 lb

## 2014-06-12 DIAGNOSIS — E118 Type 2 diabetes mellitus with unspecified complications: Secondary | ICD-10-CM

## 2014-06-12 DIAGNOSIS — E108 Type 1 diabetes mellitus with unspecified complications: Secondary | ICD-10-CM

## 2014-06-12 DIAGNOSIS — M064 Inflammatory polyarthropathy: Secondary | ICD-10-CM

## 2014-06-12 DIAGNOSIS — M7989 Other specified soft tissue disorders: Secondary | ICD-10-CM

## 2014-06-12 DIAGNOSIS — E1065 Type 1 diabetes mellitus with hyperglycemia: Secondary | ICD-10-CM

## 2014-06-12 DIAGNOSIS — E1165 Type 2 diabetes mellitus with hyperglycemia: Secondary | ICD-10-CM

## 2014-06-12 DIAGNOSIS — E1069 Type 1 diabetes mellitus with other specified complication: Secondary | ICD-10-CM

## 2014-06-12 DIAGNOSIS — IMO0002 Reserved for concepts with insufficient information to code with codable children: Secondary | ICD-10-CM

## 2014-06-12 DIAGNOSIS — Z23 Encounter for immunization: Secondary | ICD-10-CM

## 2014-06-12 DIAGNOSIS — M1990 Unspecified osteoarthritis, unspecified site: Secondary | ICD-10-CM

## 2014-06-12 MED ORDER — PREDNISONE (PAK) 10 MG PO TABS: ORAL_TABLET | ORAL | Status: DC

## 2014-06-12 NOTE — Progress Notes (Signed)
Patient ID: Corky MullDeborah O [name], female   DOB: [birth year], 58 y.o.   MRN: [id number]

## 2014-06-12 NOTE — Progress Notes (Signed)
Pre-visit discussion using our clinic review tool. No additional management support is needed unless otherwise documented below in the visit note.  

## 2014-06-12 NOTE — Assessment & Plan Note (Signed)
Positive citiric citrullinated AB ,  [facility]

## 2014-06-12 NOTE — Patient Instructions (Signed)
  Increase the evening glipizide to 10 mg and continue the 5 mg in the morning   Return for fasting labs as soon as you can   [facility]  Do not start the Prednisone taper until Thursday.   increase the lantus dose to 32 units while the prednisone is on board   Increase the novolog to 14 untis with meals if sugars are > 250   Prevnar and influenza vaccine today

## 2014-06-13 ENCOUNTER — Other Ambulatory Visit: Payer: Self-pay | Admitting: Internal Medicine

## 2014-06-13 DIAGNOSIS — M1990 Unspecified osteoarthritis, unspecified site: Secondary | ICD-10-CM | POA: Insufficient documentation

## 2014-06-13 MED ORDER — GLIPIZIDE 5 MG PO TABS: ORAL_TABLET | ORAL | Status: DC

## 2014-06-13 NOTE — Assessment & Plan Note (Signed)
Affecting both hands, with synovitis present.  Serologies done in January 2015 suggest RA , [facility] in progress.  6 day steroid taper prescribed per patient request.

## 2014-06-13 NOTE — Assessment & Plan Note (Signed)
Has been lost to follow up since January due to COPD.  Last a1c 8.6   Uses SSI ,  Glipizide and Lantus,  Blood sugars reportedly 130 to 150 fasting and > 180 post prandial.  Doses of Lantus and SSI discussed and glipizde raised to 10 mg at supper.  Return for fasting labs asap.

## 2014-07-01 ENCOUNTER — Other Ambulatory Visit: Payer: Self-pay | Admitting: Internal Medicine

## 2014-07-17 ENCOUNTER — Other Ambulatory Visit: Payer: Self-pay | Admitting: Internal Medicine

## 2014-08-15 ENCOUNTER — Other Ambulatory Visit: Payer: Self-pay | Admitting: Internal Medicine

## 2014-08-15 ENCOUNTER — Ambulatory Visit: Payer: Self-pay | Admitting: Physician Assistant

## 2014-08-15 DIAGNOSIS — I10 Essential (primary) hypertension: Secondary | ICD-10-CM | POA: Diagnosis not present

## 2014-08-15 DIAGNOSIS — J40 Bronchitis, not specified as acute or chronic: Secondary | ICD-10-CM | POA: Diagnosis not present

## 2014-08-15 DIAGNOSIS — E119 Type 2 diabetes mellitus without complications: Secondary | ICD-10-CM | POA: Diagnosis not present

## 2014-08-15 DIAGNOSIS — J449 Chronic obstructive pulmonary disease, unspecified: Secondary | ICD-10-CM | POA: Diagnosis not present

## 2014-08-16 ENCOUNTER — Other Ambulatory Visit: Payer: Self-pay | Admitting: Internal Medicine

## 2014-08-26 ENCOUNTER — Other Ambulatory Visit: Payer: Self-pay | Admitting: Internal Medicine

## 2014-09-12 ENCOUNTER — Other Ambulatory Visit: Payer: Self-pay | Admitting: Internal Medicine

## 2014-09-14 ENCOUNTER — Other Ambulatory Visit: Payer: Self-pay

## 2014-09-14 ENCOUNTER — Other Ambulatory Visit: Payer: Self-pay | Admitting: Internal Medicine

## 2014-09-14 MED ORDER — UMECLIDINIUM-VILANTEROL 62.5-25 MCG/INH IN AEPB: 1.0000 | INHALATION_SPRAY | Freq: Every day | RESPIRATORY_TRACT | Status: DC

## 2014-09-25 ENCOUNTER — Encounter: Payer: Self-pay | Admitting: Internal Medicine

## 2014-09-25 ENCOUNTER — Ambulatory Visit (INDEPENDENT_AMBULATORY_CARE_PROVIDER_SITE_OTHER): Payer: 59 | Admitting: Internal Medicine

## 2014-09-25 VITALS — BP 120/70 | HR 84 | Temp 98.2°F | Resp 16 | Ht 62.75 in | Wt 261.5 lb

## 2014-09-25 DIAGNOSIS — E785 Hyperlipidemia, unspecified: Secondary | ICD-10-CM

## 2014-09-25 DIAGNOSIS — G4733 Obstructive sleep apnea (adult) (pediatric): Secondary | ICD-10-CM

## 2014-09-25 DIAGNOSIS — M130 Polyarthritis, unspecified: Secondary | ICD-10-CM | POA: Diagnosis not present

## 2014-09-25 DIAGNOSIS — E08331 Diabetes mellitus due to underlying condition with moderate nonproliferative diabetic retinopathy with macular edema: Secondary | ICD-10-CM | POA: Diagnosis not present

## 2014-09-25 DIAGNOSIS — Z0000 Encounter for general adult medical examination without abnormal findings: Secondary | ICD-10-CM

## 2014-09-25 DIAGNOSIS — E669 Obesity, unspecified: Secondary | ICD-10-CM

## 2014-09-25 DIAGNOSIS — Z1239 Encounter for other screening for malignant neoplasm of breast: Secondary | ICD-10-CM

## 2014-09-25 DIAGNOSIS — M064 Inflammatory polyarthropathy: Secondary | ICD-10-CM | POA: Diagnosis not present

## 2014-09-25 DIAGNOSIS — E1165 Type 2 diabetes mellitus with hyperglycemia: Secondary | ICD-10-CM

## 2014-09-25 DIAGNOSIS — Z13820 Encounter for screening for osteoporosis: Secondary | ICD-10-CM

## 2014-09-25 DIAGNOSIS — IMO0002 Reserved for concepts with insufficient information to code with codable children: Secondary | ICD-10-CM

## 2014-09-25 DIAGNOSIS — J432 Centrilobular emphysema: Secondary | ICD-10-CM | POA: Diagnosis not present

## 2014-09-25 DIAGNOSIS — M1990 Unspecified osteoarthritis, unspecified site: Secondary | ICD-10-CM

## 2014-09-25 DIAGNOSIS — E118 Type 2 diabetes mellitus with unspecified complications: Secondary | ICD-10-CM

## 2014-09-25 DIAGNOSIS — E083319 Diabetes mellitus due to underlying condition with moderate nonproliferative diabetic retinopathy with macular edema, unspecified eye: Secondary | ICD-10-CM

## 2014-09-25 LAB — COMPREHENSIVE METABOLIC PANEL
ALK PHOS: 152 U/L — AB (ref 39–117)
ALT: 24 U/L (ref 0–35)
AST: 33 U/L (ref 0–37)
Albumin: 3.8 g/dL (ref 3.5–5.2)
BILIRUBIN TOTAL: 0.4 mg/dL (ref 0.2–1.2)
BUN: 10 mg/dL (ref 6–23)
CHLORIDE: 103 meq/L (ref 96–112)
CO2: 32 mEq/L (ref 19–32)
Calcium: 8.8 mg/dL (ref 8.4–10.5)
Creatinine, Ser: 0.7 mg/dL (ref 0.40–1.20)
GFR: 90.67 mL/min (ref >60.00)
Glucose, Bld: 144 mg/dL — ABNORMAL HIGH (ref 70–99)
POTASSIUM: 4.2 meq/L (ref 3.5–5.1)
Sodium: 140 mEq/L (ref 135–145)
TOTAL PROTEIN: 6.8 g/dL (ref 6.0–8.3)

## 2014-09-25 LAB — LIPID PANEL
Cholesterol: 175 mg/dL (ref 0–200)
HDL: 37 mg/dL — ABNORMAL LOW (ref >39.00)
LDL CALC: 105 mg/dL — AB (ref 0–99)
NonHDL: 138
Total CHOL/HDL Ratio: 5
Triglycerides: 164 mg/dL — ABNORMAL HIGH (ref 0.0–149.0)
VLDL: 32.8 mg/dL (ref 0.0–40.0)

## 2014-09-25 LAB — MICROALBUMIN / CREATININE URINE RATIO
Creatinine,U: 194.7 mg/dL
MICROALB UR: 1.3 mg/dL (ref 0.0–1.9)
MICROALB/CREAT RATIO: 0.7 mg/g (ref 0.0–30.0)

## 2014-09-25 LAB — HEMOGLOBIN A1C: HEMOGLOBIN A1C: 8.4 % — AB (ref 4.6–6.5)

## 2014-09-25 LAB — SEDIMENTATION RATE: SED RATE: 29 mm/h — AB (ref 0–22)

## 2014-09-25 MED ORDER — ALBUTEROL SULFATE HFA 108 (90 BASE) MCG/ACT IN AERS: 2.0000 | INHALATION_SPRAY | Freq: Four times a day (QID) | RESPIRATORY_TRACT | Status: DC | PRN

## 2014-09-25 MED ORDER — HYDROCOD POLST-CHLORPHEN POLST 10-8 MG/5ML PO LQCR: 10.0000 mL | Freq: Two times a day (BID) | ORAL | Status: DC | PRN

## 2014-09-25 NOTE — Patient Instructions (Signed)
We will check with employee health about your last tetanus vaccination  Please follow up with Dr Vira Agar on your colonoscopy  Health Maintenance Adopting a healthy lifestyle and getting preventive care can go a long way to promote health and wellness. Talk with your health care provider about what schedule of regular examinations is right for you. This is a good chance for you to check in with your provider about disease prevention and staying healthy. In between checkups, there are plenty of things you can do on your own. Experts have done a lot of research about which lifestyle changes and preventive measures are most likely to keep you healthy. Ask your health care provider for more information. WEIGHT AND DIET  Eat a healthy diet  Be sure to include plenty of vegetables, fruits, low-fat dairy products, and lean protein.  Do not eat a lot of foods high in solid fats, added sugars, or salt.  Get regular exercise. This is one of the most important things you can do for your health.  Most adults should exercise for at least 150 minutes each week. The exercise should increase your heart rate and make you sweat (moderate-intensity exercise).  Most adults should also do strengthening exercises at least twice a week. This is in addition to the moderate-intensity exercise.  Maintain a healthy weight  Body mass index (BMI) is a measurement that can be used to identify possible weight problems. It estimates body fat based on height and weight. Your health care provider can help determine your BMI and help you achieve or maintain a healthy weight.  For females 65 years of age and older:   A BMI below 18.5 is considered underweight.  A BMI of 18.5 to 24.9 is normal.  A BMI of 25 to 29.9 is considered overweight.  A BMI of 30 and above is considered obese.  Watch levels of cholesterol and blood lipids  You should start having your blood tested for lipids and cholesterol at 61 years of age,  then have this test every 5 years.  You may need to have your cholesterol levels checked more often if:  Your lipid or cholesterol levels are high.  You are older than 61 years of age.  You are at high risk for heart disease.  CANCER SCREENING   Lung Cancer  Lung cancer screening is recommended for adults 61-5 years old who are at high risk for lung cancer because of a history of smoking.  A yearly low-dose CT scan of the lungs is recommended for people who:  Currently smoke.  Have quit within the past 15 years.  Have at least a 30-pack-year history of smoking. A pack year is smoking an average of one pack of cigarettes a day for 1 year.  Yearly screening should continue until it has been 15 years since you quit.  Yearly screening should stop if you develop a health problem that would prevent you from having lung cancer treatment.  Breast Cancer  Practice breast self-awareness. This means understanding how your breasts normally appear and feel.  It also means doing regular breast self-exams. Let your health care provider know about any changes, no matter how small.  If you are in your 20s or 30s, you should have a clinical breast exam (CBE) by a health care provider every 1-3 years as part of a regular health exam.  If you are 53 or older, have a CBE every year. Also consider having a breast X-ray (mammogram) every year.  If you have a family history of breast cancer, talk to your health care provider about genetic screening.  If you are at high risk for breast cancer, talk to your health care provider about having an MRI and a mammogram every year.  Breast cancer gene (BRCA) assessment is recommended for women who have family members with BRCA-related cancers. BRCA-related cancers include:  Breast.  Ovarian.  Tubal.  Peritoneal cancers.  Results of the assessment will determine the need for genetic counseling and BRCA1 and BRCA2 testing. Cervical  Cancer Routine pelvic examinations to screen for cervical cancer are no longer recommended for nonpregnant women who are considered low risk for cancer of the pelvic organs (ovaries, uterus, and vagina) and who do not have symptoms. A pelvic examination may be necessary if you have symptoms including those associated with pelvic infections. Ask your health care provider if a screening pelvic exam is right for you.   The Pap test is the screening test for cervical cancer for women who are considered at risk.  If you had a hysterectomy for a problem that was not cancer or a condition that could lead to cancer, then you no longer need Pap tests.  If you are older than 65 years, and you have had normal Pap tests for the past 10 years, you no longer need to have Pap tests.  If you have had past treatment for cervical cancer or a condition that could lead to cancer, you need Pap tests and screening for cancer for at least 20 years after your treatment.  If you no longer get a Pap test, assess your risk factors if they change (such as having a new sexual partner). This can affect whether you should start being screened again.  Some women have medical problems that increase their chance of getting cervical cancer. If this is the case for you, your health care provider may recommend more frequent screening and Pap tests.  The human papillomavirus (HPV) test is another test that may be used for cervical cancer screening. The HPV test looks for the virus that can cause cell changes in the cervix. The cells collected during the Pap test can be tested for HPV.  The HPV test can be used to screen women 16 years of age and older. Getting tested for HPV can extend the interval between normal Pap tests from three to five years.  An HPV test also should be used to screen women of any age who have unclear Pap test results.  After 61 years of age, women should have HPV testing as often as Pap tests.  Colorectal  Cancer  This type of cancer can be detected and often prevented.  Routine colorectal cancer screening usually begins at 61 years of age and continues through 61 years of age.  Your health care provider may recommend screening at an earlier age if you have risk factors for colon cancer.  Your health care provider may also recommend using home test kits to check for hidden blood in the stool.  A small camera at the end of a tube can be used to examine your colon directly (sigmoidoscopy or colonoscopy). This is done to check for the earliest forms of colorectal cancer.  Routine screening usually begins at age 12.  Direct examination of the colon should be repeated every 5-10 years through 61 years of age. However, you may need to be screened more often if early forms of precancerous polyps or small growths are found. Skin Cancer  Check your skin from head to toe regularly.  Tell your health care provider about any new moles or changes in moles, especially if there is a change in a mole's shape or color.  Also tell your health care provider if you have a mole that is larger than the size of a pencil eraser.  Always use sunscreen. Apply sunscreen liberally and repeatedly throughout the day.  Protect yourself by wearing long sleeves, pants, a wide-brimmed hat, and sunglasses whenever you are outside. HEART DISEASE, DIABETES, AND HIGH BLOOD PRESSURE   Have your blood pressure checked at least every 1-2 years. High blood pressure causes heart disease and increases the risk of stroke.  If you are between 40 years and 83 years old, ask your health care provider if you should take aspirin to prevent strokes.  Have regular diabetes screenings. This involves taking a blood sample to check your fasting blood sugar level.  If you are at a normal weight and have a low risk for diabetes, have this test once every three years after 61 years of age.  If you are overweight and have a high risk for  diabetes, consider being tested at a younger age or more often. PREVENTING INFECTION  Hepatitis B  If you have a higher risk for hepatitis B, you should be screened for this virus. You are considered at high risk for hepatitis B if:  You were born in a country where hepatitis B is common. Ask your health care provider which countries are considered high risk.  Your parents were born in a high-risk country, and you have not been immunized against hepatitis B (hepatitis B vaccine).  You have HIV or AIDS.  You use needles to inject street drugs.  You live with someone who has hepatitis B.  You have had sex with someone who has hepatitis B.  You get hemodialysis treatment.  You take certain medicines for conditions, including cancer, organ transplantation, and autoimmune conditions. Hepatitis C  Blood testing is recommended for:  Everyone born from [birth year] through 1967.  Anyone with known risk factors for hepatitis C. Sexually transmitted infections (STIs)  You should be screened for sexually transmitted infections (STIs) including gonorrhea and chlamydia if:  You are sexually active and are younger than 61 years of age.  You are older than 61 years of age and your health care provider tells you that you are at risk for this type of infection.  Your sexual activity has changed since you were last screened and you are at an increased risk for chlamydia or gonorrhea. Ask your health care provider if you are at risk.  If you do not have HIV, but are at risk, it may be recommended that you take a prescription medicine daily to prevent HIV infection. This is called pre-exposure prophylaxis (PrEP). You are considered at risk if:  You are sexually active and do not regularly use condoms or know the HIV status of your partner(s).  You take drugs by injection.  You are sexually active with a partner who has HIV. Talk with your health care provider about whether you are at high risk of  being infected with HIV. If you choose to begin PrEP, you should first be tested for HIV. You should then be tested every 3 months for as long as you are taking PrEP.  PREGNANCY   If you are premenopausal and you may become pregnant, ask your health care provider about preconception counseling.  If you may become pregnant,  take 400 to 800 micrograms (mcg) of folic acid every day.  If you want to prevent pregnancy, talk to your health care provider about birth control (contraception). OSTEOPOROSIS AND MENOPAUSE   Osteoporosis is a disease in which the bones lose minerals and strength with aging. This can result in serious bone fractures. Your risk for osteoporosis can be identified using a bone density scan.  If you are 39 years of age or older, or if you are at risk for osteoporosis and fractures, ask your health care provider if you should be screened.  Ask your health care provider whether you should take a calcium or vitamin D supplement to lower your risk for osteoporosis.  Menopause may have certain physical symptoms and risks.  Hormone replacement therapy may reduce some of these symptoms and risks. Talk to your health care provider about whether hormone replacement therapy is right for you.  HOME CARE INSTRUCTIONS   Schedule regular health, dental, and eye exams.  Stay current with your immunizations.   Do not use any tobacco products including cigarettes, chewing tobacco, or electronic cigarettes.  If you are pregnant, do not drink alcohol.  If you are breastfeeding, limit how much and how often you drink alcohol.  Limit alcohol intake to no more than 1 drink per day for nonpregnant women. One drink equals 12 ounces of beer, 5 ounces of wine, or 1 ounces of hard liquor.  Do not use street drugs.  Do not share needles.  Ask your health care provider for help if you need support or information about quitting drugs.  Tell your health care provider if you often feel  depressed.  Tell your health care provider if you have ever been abused or do not feel safe at home. Document Released: 03/10/2011 Document Revised: 01/09/2014 Document Reviewed: 07/27/2013 [facility] Patient Information 2015 [place], [place]. This information is not intended to replace advice given to you by your health care provider. Make sure you discuss any questions you have with your health care provider.
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
## 2014-09-25 NOTE — Progress Notes (Signed)
Pre-visit discussion using our clinic review tool. No additional management support is needed unless otherwise documented below in the visit note.  

## 2014-09-26 ENCOUNTER — Encounter: Payer: Self-pay | Admitting: Internal Medicine

## 2014-09-26 ENCOUNTER — Other Ambulatory Visit: Payer: Self-pay | Admitting: Internal Medicine

## 2014-09-26 DIAGNOSIS — Z0000 Encounter for general adult medical examination without abnormal findings: Secondary | ICD-10-CM | POA: Insufficient documentation

## 2014-09-26 DIAGNOSIS — M542 Cervicalgia: Secondary | ICD-10-CM

## 2014-09-26 DIAGNOSIS — G8929 Other chronic pain: Secondary | ICD-10-CM

## 2014-09-26 MED ORDER — INSULIN GLARGINE 100 UNIT/ML SOLOSTAR PEN: 30.0000 [IU] | PEN_INJECTOR | Freq: Every day | SUBCUTANEOUS | Status: DC

## 2014-09-26 MED ORDER — INSULIN GLARGINE 100 UNIT/ML ~~LOC~~ SOLN: 25.0000 [IU] | Freq: Every day | SUBCUTANEOUS | Status: DC

## 2014-09-26 NOTE — Assessment & Plan Note (Signed)
Uncontrolled due to frequent use of prednisone and lack of access to new pharmacotherapy.  Has been lost to follow up since January due to COPD.  Last a1c 8.6, now 8.4  Uses SSI ,  Glipizide and Lantus,  Blood sugars reportedly 130 to 150 fasting and > 180 post prandial.  Doses of Lantus and SSI discussed and glipizde were raised to 10 mg at supper at last visit.  Will increase Lantus to 30 units and increase sliding scale to 12 units tid   Lab Results  Component Value Date   HGBA1C 8.4* 09/25/2014

## 2014-09-26 NOTE — Assessment & Plan Note (Signed)
Affecting both hands, with synovitis present.  Serologies done in January 2015 suggested RA , Referral to [facility] was made but not completed bc of financial issues which are now resolved. Referral made again.

## 2014-09-26 NOTE — Assessment & Plan Note (Signed)
She states that she has lost 5 lbs by home scales since November by reducing portion size.  Exercise is not regular or tolerated due to severe COPD and joint pain.  I have addressed  BMI and recommended wt loss of 10% of body weigh over the next 6 months using a low glycemic index diet

## 2014-09-26 NOTE — Assessment & Plan Note (Signed)
She is on maximal dose atorvastatin for known CAD.   Lab Results  Component Value Date   CHOL 175 09/25/2014   HDL 37.00* 09/25/2014   LDLCALC 105* 09/25/2014   LDLDIRECT 105.5 12/22/2012   TRIG 164.0* 09/25/2014   CHOLHDL 5 09/25/2014   Lab Results  Component Value Date   ALT 24 09/25/2014   AST 33 09/25/2014   ALKPHOS 152* 09/25/2014   BILITOT 0.4 09/25/2014

## 2014-09-26 NOTE — Assessment & Plan Note (Addendum)
Have not seen a compliance report  Since July 2014, patient was not wearing it consistently.  Dr Kendrick FriesMcQuaid to address.

## 2014-09-26 NOTE — Assessment & Plan Note (Signed)

## 2014-10-17 ENCOUNTER — Other Ambulatory Visit: Payer: Self-pay | Admitting: Internal Medicine

## 2014-10-26 DIAGNOSIS — M79642 Pain in left hand: Secondary | ICD-10-CM | POA: Diagnosis not present

## 2014-10-26 DIAGNOSIS — M1990 Unspecified osteoarthritis, unspecified site: Secondary | ICD-10-CM | POA: Diagnosis not present

## 2014-10-26 DIAGNOSIS — G8929 Other chronic pain: Secondary | ICD-10-CM | POA: Diagnosis not present

## 2014-10-26 DIAGNOSIS — M25562 Pain in left knee: Secondary | ICD-10-CM | POA: Diagnosis not present

## 2014-10-26 DIAGNOSIS — M542 Cervicalgia: Secondary | ICD-10-CM | POA: Diagnosis not present

## 2014-10-26 DIAGNOSIS — M79641 Pain in right hand: Secondary | ICD-10-CM | POA: Diagnosis not present

## 2014-10-26 DIAGNOSIS — M25561 Pain in right knee: Secondary | ICD-10-CM | POA: Diagnosis not present

## 2014-11-14 ENCOUNTER — Other Ambulatory Visit: Payer: Self-pay | Admitting: Internal Medicine

## 2014-11-14 ENCOUNTER — Ambulatory Visit: Payer: Self-pay | Admitting: Internal Medicine

## 2014-11-14 DIAGNOSIS — Z1231 Encounter for screening mammogram for malignant neoplasm of breast: Secondary | ICD-10-CM | POA: Diagnosis not present

## 2014-11-14 LAB — HM MAMMOGRAPHY: HM Mammogram: NEGATIVE

## 2014-11-20 ENCOUNTER — Encounter: Payer: Self-pay | Admitting: Internal Medicine

## 2014-11-20 ENCOUNTER — Ambulatory Visit (INDEPENDENT_AMBULATORY_CARE_PROVIDER_SITE_OTHER): Payer: 59 | Admitting: Internal Medicine

## 2014-11-20 VITALS — BP 140/90 | HR 91 | Temp 98.5°F | Resp 18 | Ht 63.0 in | Wt 262.8 lb

## 2014-11-20 DIAGNOSIS — J441 Chronic obstructive pulmonary disease with (acute) exacerbation: Secondary | ICD-10-CM | POA: Diagnosis not present

## 2014-11-20 DIAGNOSIS — R002 Palpitations: Secondary | ICD-10-CM | POA: Diagnosis not present

## 2014-11-20 DIAGNOSIS — R000 Tachycardia, unspecified: Secondary | ICD-10-CM

## 2014-11-20 DIAGNOSIS — R062 Wheezing: Secondary | ICD-10-CM

## 2014-11-20 MED ORDER — AZITHROMYCIN 500 MG PO TABS: 500.0000 mg | ORAL_TABLET | Freq: Every day | ORAL | Status: DC

## 2014-11-20 MED ORDER — METHYLPREDNISOLONE ACETATE 80 MG/ML IJ SUSP
40.0000 mg | Freq: Once | INTRAMUSCULAR | Status: AC
  Administered 2014-11-20: 40 mg via INTRAMUSCULAR

## 2014-11-20 MED ORDER — PREDNISONE 10 MG PO TABS: ORAL_TABLET | ORAL | Status: DC

## 2014-11-20 MED ORDER — HYDROCOD POLST-CHLORPHEN POLST 10-8 MG/5ML PO LQCR: 5.0000 mL | Freq: Two times a day (BID) | ORAL | Status: DC | PRN

## 2014-11-20 NOTE — Patient Instructions (Signed)
You are having a COPD exacerbation which may aggravate your heart disease  If you become more short of breath than you are now,  You need to go to the nearest ER immediately or call 911  I will try to treat you at home with the following:  Prednisone:  60 mg twice daily for 3 days,  Then 60 mg daily for 1 day and taper by 1 tablet daily until gone Azithromycin  1 tablet daily for 7 days  Continue your spiriva daily .  And the symbicort 2 puffs twice daily  Use your albuterol inhaler as needed

## 2014-11-20 NOTE — Progress Notes (Signed)
Patient ID: Catherine [name], female   DOB: 08/16/1956, 59 y.o.   MRN: [id number]  Patient Active Problem List   Diagnosis Date Noted  . Tachycardia 11/21/2014  . Encounter for preventive health examination 09/26/2014  . Neck pain of over 3 months duration 09/26/2014  . Inflammatory arthritis 06/13/2014  . Elevated alkaline phosphatase measurement 10/09/2013  . Hand swelling 06/15/2013  . Leg swelling 04/05/2013  . OSA (obstructive sleep apnea) 03/01/2013  . Conjunctivitis 02/04/2013  . Allergic rhinitis 02/04/2013  . Pulmonary nodule 09/15/2012  . COPD exacerbation 05/28/2012  . Chronic hypoxemic respiratory failure 05/03/2012  . COPD (chronic obstructive pulmonary disease) 04/28/2012  . Diabetes mellitus type 2, uncontrolled, with complications 11/07/2011  . GERD (gastroesophageal reflux disease)   . Obesity (BMI 30-39.9)   . Depression   . Anxiety   . Tobacco abuse, in remission   . Screening for colon cancer   . Gastritis and duodenitis   . Screening for breast cancer   . CAD (coronary artery disease) 12/02/2010  . HTN (hypertension) 12/02/2010  . Hyperlipidemia 12/02/2010    Subjective:  CC:   Chief Complaint  Patient presents with  . Acute Visit    EMS was called patient HR 140, Ems gave breathing treament and took 2 hours get HR to 111.  Marland Kitchen. Palpitations    chest tightness.    HPI:   Catherine [name] is a 59 y.o. female who presents for  COPD exacerbation.  Symptoms  started last Tuesday after eating out, came and developed shortness of breath,  Chest tightness.  Called EMS , HR was 148. Was given a double DuoNeb and  And chest tightness improved,  HT imporved.  Declined transfer to ED.  Called office on Wednesday ,  Was given appt for Monday (6 days later) by front office,  No triage done. Wheezing,  Shot of breath with exertion,  Cough is nonproductive but persistent ,  Needs refill on tussionex.      Past Medical History  Diagnosis Date   . Diabetes mellitus   . Leukocytosis   . Hyperlipidemia   . COPD (chronic obstructive pulmonary disease)   . Hyperglycemia   . Hypertension   . S/P cardiac catheterization March 2012    normal coronaries,  due to chest pain ([place])  . Tobacco abuse, in remission     quit oct during hospitalization   . Screening for colon cancer June 2011    polyps found, next one due 2014 ([place])  . Gastritis and duodenitis June 2011    EGD  . Screening for breast cancer Jul 28 2011    normal  . GERD (gastroesophageal reflux disease)   . Obesity (BMI 30-39.9)   . Depression   . Anxiety     Past Surgical History  Procedure Laterality Date  . Cardiac catheterization  11/22/2010    no significant disease  . Abdominal hysterectomy    . Hemorrhoid surgery    . Abdominal hysterectomy    . Bilateral oophorectomy  1995    and TAH.  noncancerous reasons       The following portions of the patient's history were reviewed and updated as appropriate: Allergies, current medications, and problem list.    Review of Systems:   Patient denies headache, fevers, malaise, unintentional weight loss, skin rash, eye pain, sinus congestion and sinus pain, sore throat, dysphagia,  hemoptysis , cough, dyspnea, wheezing, chest pain, palpitations, orthopnea, edema, abdominal pain, nausea, melena, diarrhea, constipation, flank pain,  dysuria, hematuria, urinary  Frequency, nocturia, numbness, tingling, seizures,  Focal weakness, Loss of consciousness,  Tremor, insomnia, depression, anxiety, and suicidal ideation.     History   Social History  . Marital Status: Married    Spouse Name: N/A  . Number of Children: 4  . Years of Education: N/A   Occupational History  . Secretary    Social History Main Topics  . Smoking status: Former Smoker -- 1.00 packs/day for 40 years    Types: Cigarettes    Quit date: 06/27/2011  . Smokeless tobacco: Never Used  . Alcohol Use: No  . Drug Use: No  . Sexual  Activity: Not on file   Other Topics Concern  . Not on file   Social History Narrative    Objective:  Filed Vitals:   11/20/14 1746  BP: 140/90  Pulse: 91  Temp: 98.5 F (36.9 C)  Resp: 18     General appearance: alert, cooperative and appears stated age Ears: normal TM's and external ear canals both ears Throat: lips, mucosa, and tongue normal; teeth and gums normal Neck: no adenopathy, no carotid bruit, supple, symmetrical, trachea midline and thyroid not enlarged, symmetric, no tenderness/mass/nodules Back: symmetric, no curvature. ROM normal. No CVA tenderness. Lungs: clear to auscultation bilaterally Heart: regular rate and rhythm, S1, S2 normal, no murmur, click, rub or gallop Abdomen: soft, non-tender; bowel sounds normal; no masses,  no organomegaly Pulses: 2+ and symmetric Skin: Skin color, texture, turgor normal. No rashes or lesions Lymph nodes: Cervical, supraclavicular, and axillary nodes normal.  Assessment and Plan:  COPD exacerbation 12 day steroid taper , abx and tussionex.   Return on Friday for recheck.      Tachycardia Sinus tachycardia by today's exam. Raynelle Fanning     Updated Medication List Outpatient Encounter Prescriptions as of 11/20/2014  Medication Sig  . ACCU-CHEK SMARTVIEW test strip TEST BLOOD SUGAR FOUR TIMES DAILY  . albuterol (PROVENTIL HFA) 108 (90 BASE) MCG/ACT inhaler Inhale 2 puffs into the lungs every 6 (six) hours as needed for wheezing.  Marland Kitchen ALPRAZolam (XANAX) 0.25 MG tablet TAKE 1 TABLET BY MOUTH EVERY NIGHT AT BEDTIME AS NEEDED  . aspirin 81 MG EC tablet Take 81 mg by mouth daily.    Marland Kitchen atorvastatin (LIPITOR) 80 MG tablet TAKE 1 TABLET BY MOUTH EVERY DAY  . benzonatate (TESSALON) 200 MG capsule Take 1 capsule (200 mg total) by mouth 3 (three) times daily as needed for cough.  . butalbital-acetaminophen-caffeine (FIORICET, ESGIC) 50-325-40 MG per tablet Take 1 tablet by mouth every 4 (four) hours as needed for headache.  .  chlorpheniramine-HYDROcodone (TUSSIONEX) 10-8 MG/5ML LQCR Take 10 mLs by mouth every 12 (twelve) hours as needed for cough.  . docusate sodium (COLACE) 100 MG capsule Take 200 mg by mouth daily.  . furosemide (LASIX) 40 MG tablet TAKE 1 TABLET BY MOUTH EVERY DAY AS NEEDED FOR LEG SWELLING AND TO MAINTAIN WEIGHT.  Marland Kitchen glipiZIDE (GLUCOTROL) 5 MG tablet 1 tablet before breakfast and 2 before dinner  . hydroxychloroquine (PLAQUENIL) 200 MG tablet Take 1 tablet by mouth daily.  . Insulin Glargine (LANTUS SOLOSTAR) 100 UNIT/ML Solostar Pen Inject 30 Units into the skin daily at 10 pm.  . insulin glargine (LANTUS) 100 UNIT/ML injection Inject 0.25 mLs (25 Units total) into the skin at bedtime.  . Insulin Syringe-Needle U-100 (INSULIN SYRINGE .5CC/31GX5/16") 31G X 5/16" 0.5 ML MISC Test twice a day  . ipratropium-albuterol (DUONEB) 0.5-2.5 (3) MG/3ML SOLN INHALE 1 VIAL VIA NEBULIZER  EVERY 6 HOURS AS NEEDED  . latanoprost (XALATAN) 0.005 % ophthalmic solution Place 1 drop into both eyes at bedtime.  Marland Kitchen NOVOLOG FLEXPEN 100 UNIT/ML FlexPen INJECT 10 UNITS UNDER THE SKIN THREE TIMES DAILY BEFORE MEALS  . pantoprazole (PROTONIX) 40 MG tablet TAKE 1 TABLET BY MOUTH TWICE DAILY  . PARoxetine (PAXIL) 10 MG tablet TAKE 1 TABLET BY MOUTH EVERY MORNING  . Potassium Gluconate 595 MG CAPS Take 2 capsules by mouth daily.   Marland Kitchen Umeclidinium-Vilanterol (ANORO ELLIPTA) 62.5-25 MCG/INH AEPB Inhale 1 puff into the lungs daily.  Marland Kitchen azithromycin (ZITHROMAX) 500 MG tablet Take 1 tablet (500 mg total) by mouth daily.  . chlorpheniramine-HYDROcodone (TUSSIONEX PENNKINETIC ER) 10-8 MG/5ML LQCR Take 5 mLs by mouth every 12 (twelve) hours as needed for cough.  . predniSONE (DELTASONE) 10 MG tablet 6 tablets twice daily for 3 days,  Then start a 6 day tapering dose by  10 mg daily until gone  . [DISCONTINUED] azithromycin (ZITHROMAX) 250 MG tablet Take 1 tablet (250 mg total) by mouth daily. (Patient not taking: Reported on 11/20/2014)  .  [EXPIRED] methylPREDNISolone acetate (DEPO-MEDROL) injection 40 mg      Orders Placed This Encounter  Procedures  . EKG 12-Lead    No Follow-up on file.

## 2014-11-21 ENCOUNTER — Encounter: Payer: Self-pay | Admitting: Internal Medicine

## 2014-11-21 DIAGNOSIS — R000 Tachycardia, unspecified: Secondary | ICD-10-CM | POA: Insufficient documentation

## 2014-11-21 NOTE — Assessment & Plan Note (Signed)
Sinus tachycardia by today's exam. Catherine [name]/EKG

## 2014-11-21 NOTE — Assessment & Plan Note (Signed)
12 day steroid taper , abx and tussionex.   Return on Friday for recheck.

## 2014-11-24 ENCOUNTER — Other Ambulatory Visit: Payer: Self-pay | Admitting: Internal Medicine

## 2014-12-08 ENCOUNTER — Telehealth: Payer: Self-pay | Admitting: *Deleted

## 2014-12-08 MED ORDER — HYDROCOD POLST-CHLORPHEN POLST 10-8 MG/5ML PO LQCR: 5.0000 mL | Freq: Two times a day (BID) | ORAL | Status: DC | PRN

## 2014-12-08 NOTE — Telephone Encounter (Signed)
Pt called requesting a refill of Tussionex.  Last refill 1.19.16.  Please advise

## 2014-12-08 NOTE — Telephone Encounter (Signed)
Ok to refill,  printed rx  

## 2014-12-08 NOTE — Telephone Encounter (Signed)
Pt notified Rx ready for pick up.  Pt states she will pick up on Monday.

## 2014-12-15 ENCOUNTER — Ambulatory Visit
Admit: 2014-12-15 | Disposition: A | Discharge status: Home / Self Care | Payer: Self-pay | Attending: Family Medicine | Admitting: Family Medicine

## 2014-12-15 DIAGNOSIS — Z7982 Long term (current) use of aspirin: Secondary | ICD-10-CM | POA: Diagnosis not present

## 2014-12-15 DIAGNOSIS — E118 Type 2 diabetes mellitus with unspecified complications: Secondary | ICD-10-CM | POA: Diagnosis not present

## 2014-12-15 DIAGNOSIS — Z79899 Other long term (current) drug therapy: Secondary | ICD-10-CM | POA: Diagnosis not present

## 2014-12-15 DIAGNOSIS — L089 Local infection of the skin and subcutaneous tissue, unspecified: Secondary | ICD-10-CM | POA: Diagnosis not present

## 2014-12-18 ENCOUNTER — Other Ambulatory Visit: Payer: Self-pay | Admitting: Internal Medicine

## 2014-12-18 ENCOUNTER — Other Ambulatory Visit: Payer: Self-pay | Admitting: *Deleted

## 2014-12-18 MED ORDER — PAROXETINE HCL 10 MG PO TABS: 10.0000 mg | ORAL_TABLET | Freq: Every morning | ORAL | Status: DC

## 2014-12-19 ENCOUNTER — Telehealth: Payer: Self-pay | Admitting: Pulmonary Disease

## 2014-12-19 MED ORDER — UMECLIDINIUM-VILANTEROL 62.5-25 MCG/INH IN AEPB: 1.0000 | INHALATION_SPRAY | Freq: Every day | RESPIRATORY_TRACT | Status: DC

## 2014-12-19 NOTE — Telephone Encounter (Signed)
Form received for refill of Anoro Ellipta.  Patient was last seen 11/2013 Was supposed to follow up in 01/2014.  No upcoming appts on file and patient was never seen 01/2014. Need to know is she is still on this medication.  NEEDS OV!  LM x 1 with husband to have pt call back.  Will send to [facility] so that this can be followed up on.

## 2014-12-19 NOTE — Telephone Encounter (Signed)
Spoke with pt and she is still taking Anoro.  Appt with Dr Kendrick FriesMcQuaid scheduled .  Refill sent

## 2014-12-19 NOTE — Telephone Encounter (Signed)
Pt returned call [phone]

## 2014-12-26 NOTE — H&P (Signed)
PATIENT NAME:  Catherine [name], Catherine [name] MR#:  [id number] DATE OF BIRTH:  1955-10-03  DATE OF ADMISSION:  07/22/2012  PRIMARY CARE PHYSICIAN: Dr. Duncan Dull   CHIEF COMPLAINT: Chest pain.   HISTORY OF PRESENT ILLNESS: This is a 59 year old female who presents to the hospital secondary to chest pain. She describes the chest pain as being a sharp pain that woke her up from sleep around 7 o'clock this morning. The pain was also radiating to her back associated with some nausea and some diaphoresis. She has had chest pain before but not as severe as this one. She was a bit worried, therefore, took some aspirin, called EMS, and was brought to the ER for further evaluation. After getting some nitro and another three baby aspirin her chest pain has now resolved. She denies any worsening shortness of breath, any fevers, chills, cough, or any other associated symptoms presently. Hospitalist services were contacted for further treatment and evaluation.   REVIEW OF SYSTEMS: CONSTITUTIONAL: No documented fever. No weight gain, no weight loss. EYES: No blurry or double vision. ENT: No tinnitus. No postnasal drip. No redness of the oropharynx. RESPIRATORY: Positive cough which is chronic. No wheeze, no hemoptysis. Positive COPD. CARDIOVASCULAR: Positive chest pain. No orthopnea. No palpitations. No syncope. GI: No nausea, no vomiting, no diarrhea, no abdominal pain, no melena, no hematochezia. GU: No dysuria, no hematuria. ENDOCRINE: No polyuria or nocturia. No heat or cold intolerance. HEME: No anemia, no bruising, no bleeding. INTEGUMENTARY: No rashes, no lesions. MUSCULOSKELETAL: No arthritis, no swelling, no gout. NEUROLOGIC: No numbness, no tingling, no ataxia, no seizure-type activity. PSYCH: Positive anxiety. No insomnia. No ADD.  PAST MEDICAL HISTORY:  1. Chronic obstructive pulmonary disease, oxygen dependent. 2. Hypertension. 3. Hyperlipidemia.  4. Gastroesophageal reflux  disease. 5. Depression/anxiety. 6. Diabetes.   ALLERGIES: Aspirin, codeine, ibuprofen, Lorcet, Percocet, and Vicodin.   PAST SURGICAL HISTORY:  1. Hysterectomy. 2. Hemorrhoidectomy. 3. Tonsillectomy.   FAMILY HISTORY: Significant for heart disease. Mother had bypass in her late 63's. Father, brother, and sister have diabetes.   SOCIAL HISTORY: She lives with her husband. Has two sons. She does have a strong history of tobacco abuse but quit a year ago. No alcohol abuse. No illicit drug abuse.   CURRENT MEDICATIONS:  1. DuoNebs q.4 hours as needed.  2. Xanax 0.25 mg at bedtime as needed.  3. Aspirin 81 mg daily.  4. Atorvastatin 80 mg daily.  5. Chlorpheniramine/hydrocodone 8 mg/10 mg which is 5 mL b.i.d. as needed.  6. Daliresp 500 mcg every other day. 7. Colace 100 mg 2 caps every other day. 8. Glipizide 5 mg b.i.d.  9. Lantus 20 units in the morning.  10. Protonix 40 mg b.i.d.  11. Potassium gluconate 595 mg daily.  12. Prednisone taper. She is currently at 40 mg b.i.d. Today is her last day of 40 mg dose. She has another 30 mg b.i.d. for three days, then 20 mg b.i.d. for three days, and then 10 mg b.i.d. for three days left.  13. Albuterol 2 puffs q.6 hours as needed.  14. Spiriva 1 puff daily.  15. Symbicort 2 puffs b.i.d.  16. Tramadol 50 mg q.6 hours as needed.  17. Zetonna 37 mcg inhalational spray b.i.d.   PHYSICAL EXAMINATION ON ADMISSION:   VITAL SIGNS: Temperature 97.9, pulse 93, respirations 22, blood pressure 131/77, sats 96% on 2 liters nasal cannula.   GENERAL: She is a pleasant appearing female in mild respiratory distress.   HEENT: Atraumatic, normocephalic. Her  extraocular muscles were intact. Her pupils are equal and reactive to light. Sclerae anicteric. No conjunctival injection. No pharyngeal erythema.   NECK: Supple. No jugular venous distention, no bruits, no lymphadenopathy, no thyromegaly.   HEART: Regular rate and rhythm. No murmurs, no rubs, no  clicks.   LUNGS: She has prolonged inspiratory and expiratory phase. Negative use of accessory muscles. No dullness to percussion. No wheezing. No rhonchi. No rales heard.   ABDOMEN: Soft, flat, nontender, nondistended. Has good bowel sounds. No hepatosplenomegaly appreciated.   EXTREMITIES: No evidence of any cyanosis, clubbing, or peripheral edema. Has +2 pedal and radial pulses bilaterally.   NEUROLOGICAL: The patient is alert, awake, and oriented x3 with no focal motor or sensory deficits appreciated bilaterally.   SKIN: Moist and warm with no rash appreciated.   LYMPHATIC: There is no cervical or axillary lymphadenopathy.   LABORATORY, DIAGNOSTIC, AND RADIOLOGICAL DATA: Serum glucose 229, BUN 14, creatinine 0.8, sodium 138, potassium 4.4, chloride 103, bicarb 28. LFTs within normal limits. Troponin less than 0.02. CK 254. CK-MB 4.6. White cell count 22.5, hemoglobin 13.0, hematocrit 38.1, platelet count 222. INR 0.9.   The patient did have an EKG done which shows normal sinus rhythm with normal axis and no evidence of acute ST or T wave changes.   ASSESSMENT AND PLAN: This is a 59 year old female with history of COPD, oxygen dependent, hypertension, hyperlipidemia, anxiety, gastroesophageal reflux disease, and diabetes who presents to the hospital with chest pain.  1. Chest pain. The patient does have significant risk factors given her diabetes, history of tobacco abuse, and her being a female. Therefore, I will observe her overnight on telemetry. Follow serial cardiac markers. Continue aspirin, nitro, and statin. The patient had a cardiac cath done in 2012 in March which showed minimal coronary artery disease. I discussed the case with Dr. Mariah MillingGollan and he recommends holding off on a stress test for now. If her pain persists even after she gets over the COPD exacerbation, we will consider doing a stress test as an outpatient.  2. Chronic obstructive pulmonary disease. The patient is recovering  from an acute exacerbation, although currently is doing much better. I will continue her prednisone taper. Continue Spiriva, Symbicort, Daliresp, and p.r.n. DuoNebs.  3. Hyperlipidemia. Continue atorvastatin.  4. Gastroesophageal reflux disease. Continue Protonix.  5. Diabetes. Continue Lantus and glipizide and follow her blood sugars. 6. Anxiety. Continue p.r.n. Xanax.   CODE STATUS: The patient is a FULL CODE.   TIME SPENT: 45 minutes.   ____________________________ Rolly PancakeVivek J. Cherlynn KaiserSainani, MD vjs:drc D: 07/22/2012 11:02:26 ET T: 07/22/2012 11:20:50 ET JOB#: [id number]  cc: Rolly PancakeVivek J. Cherlynn KaiserSainani, MD, <Dictator> Duncan Dulleresa [name], MD [place] SirenVIVEK J SAINANI MD ELECTRONICALLY SIGNED 07/30/2012 17:37

## 2014-12-26 NOTE — H&P (Signed)
PATIENT NAME:  Catherine [name], Catherine [name] MR#:  [id number] DATE OF BIRTH:  03-23-1956  DATE OF ADMISSION:  04/14/2012  PRIMARY CARE PHYSICIAN: Dr. Darrick Huntsman   ER PHYSICIAN: Dr. Manson Passey    ADMITTING PHYSICIAN: Dr. Tilda Franco    PRESENTING COMPLAINT: Shortness of breath x5 days.   HISTORY OF PRESENT ILLNESS: The patient is a 59 year old lady with COPD who has been here four times in the last six months. This is her 5th visit this year. She presented with complaint of shortness of breath which has been ongoing since five days ago. Denies any history of fever, sick contact, or long distance travel. No recent change in medication. She was seen by her primary care physician and medications adjusted. Over the last few days she has been progressively getting worse and she presented to the Emergency Room today. Here she was noted to have respiratory distress and started on nebulizers. She's had three courses of nebulizer with steroid with no improvement and was referred to hospitalist. Of note, the patient's medications were up titrated five days ago by primary care physician. Denies any chest pain. No PND, orthopnea, or pedal edema. Denies any sick contact but had episode of upper respiratory tract infection about two weeks prior to the current problem.  REVIEW OF SYSTEMS: CONSTITUTIONAL: Positive for fatigue but no fever. No pain. No weight loss or weight gain. EYES: No blurred vision, redness, or discharge. ENT: No tinnitus or epistaxis. No postnasal drip. RESPIRATORY: Positive for cough, shortness of breath. Cough is nonproductive. CARDIOVASCULAR: Denies any chest pain. No PND, orthopnea, pedal edema. No palpitations. GI: No nausea, vomiting, diarrhea, abdominal pain. No change in bowel habits. GU: No dysuria, frequency, or incontinence. HEMATOLOGIC: No anemia, easy bruising, swollen glands or bleeding. SKIN: No rashes, change in hair or skin texture. MUSCULOSKELETAL: No joint pain, redness, swelling, or limited activity.  NEUROLOGIC: No numbness, weakness, tremors, or vertigo. PSYCH: No anxiety or depression.   PAST MEDICAL HISTORY:  1. Chronic obstructive pulmonary disease. This is her 4th admission since October 2012 for COPD exacerbation. Last admission was one month ago in July 2013.  2. Hypertension.  3. Hyperlipidemia.  4. COPD.  5. Remote tobacco use. Quit one year ago after 40-pack-year history. 6. Gastroesophageal reflux disease.   7. Depression/anxiety.  8. Diabetes.  9. Cardiac catheterization in March 2012 showing no coronary artery disease.  10. Pulmonary nodules. PET scan reportedly negative in the past.  11. Obstructive sleep apnea, not on CPAP due to intolerance.  12. Obesity.   PAST SURGICAL HISTORY:  1. Hysterectomy.  2. Hemorrhoidectomy.  3. Tonsillectomy.   ALLERGIES: Aspirin, codeine, ibuprofen, Lorcet, Percocet, and Vicodin.   FAMILY HISTORY: Positive for coronary artery disease in the mother who had bypass in her late 77's. Diabetes in brother, sister, and father.   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 Has remote history of tobacco use, 40-pack-year, which she quit last year.   MEDICATIONS: 1. Symbicort inhaler 2 puffs twice daily.  2. Spiriva 18 mcg daily.  3. Protonix 40 mg daily.  4. Prednisone 10 mg daily.  5. Paxil 10 mg daily.  6. NovoLog sliding scale. 7. Lipitor 80 mg once daily.  8. Lantus 20 units sub-Q daily.  9. Glipizide 5 mg twice a day.  10. DuoNeb inhaler p.r.n.  11. Daliresp 500 mcg daily.   12. Ciclesonide nasal spray one spray twice a day. 13. Aspirin 81 mg p.o. daily.   Please note, the patient was  on Advair inhaler about two weeks ago, was changed to Symbicort and started on prednisone tablet by primary care physician.   PHYSICAL EXAMINATION:   VITAL SIGNS: Temperature 97.3, pulse 93, respirations 18, blood pressure 115/86 on arrival, now is 155/81, oxygen sat 93 on room air.   GENERAL: Elderly lady, obese,  lying on the gurney awake, alert, oriented to time, place, and person in mild respiratory distress, use of accessory muscles.   HEENT: Atraumatic, normocephalic. Pupils equal, reactive to light and accommodation. Extraocular movements intact. Mucous membranes pink, moist.   NECK: Supple. No JV distention.   CHEST: Decreased air entry. Generalized rhonchi. No rales.   HEART: Regular rate and rhythm. No murmurs.   ABDOMEN: Obese, pendulous, moves with respiration, nontender. Bowel sounds normoactive. No organomegaly.   EXTREMITIES: No edema. No clubbing. No deformity.   NEUROLOGICAL: No focal motor or sensory deficits.   PSYCH: Affect appropriate to situation.   LABORATORY, DIAGNOSTIC, AND RADIOLOGICAL DATA: There is no EKG.  Chest x-ray showed no obvious infiltrates.   CBC white count 16 up from 14 from one month ago, hemoglobin 13, platelets 286, neutrophil count 81. Chemistry unremarkable. Creatinine 0.9, BUN 20, glucose 186, calcium 8.5, potassium 3.7, alkaline phosphatase 143 down from 147 from one month ago.    IMPRESSION:  1. Chronic obstructive pulmonary disease, acute exacerbation.  2. Hypertension, stable, diet control only.  3. Type II diabetes currently exacerbated by steroid use.  4. Anxiety/depression, stable.  5. Hyperlipidemia, stable.  6. Gastroesophageal reflux disease.   7. Obstructive sleep apnea, stable.   PLAN:  1. Admit to general medical floor for respiratory support with oxygen, nebulizer, steroids, and antibiotics. Start patient on ceftriaxone and Zithromax.  2. Sliding scale insulin for blood sugar control. Augment blood sugar control.  3. Resume outpatient medication.  4. Obtain an EKG, serial cardiac enzymes, troponin level.  5. Pain control.  6. GI prophylaxis with Protonix.  7. DVT prophylaxis with Lovenox.   CODE STATUS: FULL CODE.   TOTAL PATIENT CARE TIME: 50 minutes.   ____________________________ Floy SabinaMarcel I. Tilda FrancoAkuneme,  MD mia:drc D: 04/14/2012 02:15:32 ET T: 04/14/2012 07:48:02 ET JOB#: [id number]  cc: Alexzia Kasler I. Tilda FrancoAkuneme, MD, <Dictator> Duncan Dulleresa [name], MD Margaret PyleMARCEL I Edword Cu MD ELECTRONICALLY SIGNED 04/14/2012 22:31

## 2014-12-26 NOTE — Discharge Summary (Signed)
PATIENT NAME:  Catherine Catherine [name], Catherine Catherine [name] MR#:  [id number] DATE OF BIRTH:  November 09, 1955  DATE OF ADMISSION:  07/22/2012 DATE OF DISCHARGE:  07/23/2012  For Catherine [name] detailed note, please see the History and Physical done on admission.   DIAGNOSES AT DISCHARGE:  1. Chest pain, likely related to her chronic obstructive pulmonary disease.  2. Chronic obstructive pulmonary disease, oxygen dependent.  3. Hyperlipidemia.  4. Anxiety. 5. Gastroesophageal reflux disease.  6. Diabetes.   DIET: The patient is being discharged on Catherine [name] low-sodium, American Diabetic Association, low fat diet.   ACTIVITY: As tolerated.   FOLLOWUP: Follow up with Dr. Duncan Dulleresa [name] in the next 1 to 2 weeks.   DISCHARGE MEDICATIONS:  1. Symbicort 160/4.5, 2 puffs b.i.d.  2. Protonix 40 mg b.i.d.  3. Proventil inhaler 2 puffs q. 6 hours as needed.  4. Xanax 0.25 mg at bedtime.  5. Aspirin 81 mg daily.  6. Zetonna 37 mcg ,one spray b.i.d.  7. Glipizide 5 mg b.i.d.  8. Albuterol ipratropium nebulizers q. 4 hours as needed.  9. Potassium gluconate 595 mg daily.  10. Daliresp 500 mcg every other day.  11. Spiriva 1 puff daily.  12. Tramadol 50 mg q.6 hours as needed. 13. Atorvastatin 80 mg daily.  14. Colace 100 mg 2 tabs daily.  15. Lantus 20 units in the morning.  16. Prednisone taper currently at 30 mg for the next three days, then 20 mg for the next three days, and then 10 mg for the next three days, then discontinue.  17. NovoLog sliding scale. 18. Tussionex 5 mL b.i.d.   BRIEF HOSPITAL COURSE: This is Catherine [name] 59 year old female with multiple medical problems as mentioned above who presented to the hospital with chest pain.  1. Chest pain: The patient was admitted overnight in the hospital as her chest pain was typical for angina and she has risk factors given her tobacco abuse, obesity, and diabetes. She had Catherine [name] recent cardiac catheterization in March of last year which showed some minimal coronary artery disease; therefore, she was  observed overnight and had three sets of cardiac markers checked which were negative. I discussed the case with Dr. Mariah MillingGollan, her cardiologist, who did not think that the patient would benefit from Catherine [name] stress test. Since currently she is chest pain free and her markers are negative she can be discharged, and if she were to have persistent symptoms she can get Catherine [name] stress test done as an outpatient.  2. Chronic obstructive pulmonary disease:  She is recovering from chronic obstructive pulmonary disease exacerbation, which is likely the cause of her chest pain. Presently she will finish her prednisone taper along with her Symbicort, Spiriva, and Daliresp as stated.  3. Diabetes: The patient was maintained on glipizide, her Lantus, and her sliding scale coverage. She will continue that. 4. Anxiety:  The patient will continue her Xanax as stated.  5. Hyperlipidemia: The patient was maintained on her atorvastatin. She will continue that.  6. Gastroesophageal reflux disease:  The patient will continue her Protonix, which is what she was maintained on.   CODE STATUS: The patient is Catherine [name] FULL CODE.   TIME SPENT: 35 minutes.  ____________________________ Rolly PancakeVivek J. Cherlynn KaiserSainani, MD vjs:bjt D: 07/23/2012 14:49:58 ET T: 07/25/2012 15:48:33 ET JOB#: [id number]  cc: Duncan Dulleresa [name], MD [place] SirenVIVEK J SAINANI MD ELECTRONICALLY SIGNED 07/30/2012 17:38

## 2014-12-26 NOTE — Discharge Summary (Signed)
PATIENT NAME:  Catherine [name], Catherine [name] MR#:  [id number] DATE OF BIRTH:  1955/10/02  DATE OF ADMISSION:  04/14/2012 DATE OF DISCHARGE:  04/15/2012  HISTORY: For a detailed note, please take a look at the history and physical done on admission by Dr. Tilda Franco.   DIAGNOSES AT DISCHARGE:  1. Chronic obstructive pulmonary disease exacerbation.  2. Acute on chronic respiratory failure secondary to chronic obstructive pulmonary disease exacerbation.  3. Yeast infection.  4. Hypertension.  5. Diabetes.  6. Anxiety.   DIET: The patient is being discharged on an American Diabetic Association low sodium, low fat diet.   ACTIVITY: As tolerated.   FOLLOWUP: Follow-up with Dr. Duncan Dull in the next 1 to 2 weeks.   DISCHARGE MEDICATIONS: 1. Protonix 40 mg daily.  2. Paxil 10 mg daily.  3. Spiriva 1 puff daily.  4. Glipizide 5 mg b.i.d.  5. DuoNebs q.4 hours. 6. NovoLog sliding scale. 7. Lantus 20 units at bedtime.  8. Lipitor 80 mg daily.  9. Aspirin 81 mg daily.  10. Prednisone taper as directed, starting at 40 mg for three days down to 10 mg over the next 12 days along with a prednisone taper starting at 60 mg, down to 10 mg over the next six days. 11. Symbicort 2 puffs b.i.d.  12. Ciclesonide one spray to each nostril b.i.d. 13. Daliresp 500 mcg every other day.   PERTINENT STUDIES: Chest x-ray done on admission showing no acute cardiopulmonary disease.   HOSPITAL COURSE: This is a 59 year old female who presented to the hospital secondary to worsening shortness of breath and chronic obstructive pulmonary disease exacerbation.  1. Acute on chronic respiratory failure. This was likely secondary to chronic obstructive pulmonary disease exacerbation. This is the patient's fourth hospitalization in the past three months for similar symptoms. The patient was started on a p.o. prednisone taper, also started on empiric antibiotics. She was also maintained on her Symbicort and her Spiriva and  Daliresp was added every other day. Her clinical symptoms since admission have improved. She was advised to use her oxygen more on exertion and continue close follow-up with her primary care physician and also with her pulmonologist. The patient would likely benefit from outpatient referral to pulmonary rehab.  2. Chronic obstructive pulmonary disease exacerbation. This is likely suspected due to possible underlying bronchitis and just worsening exercise intolerance. The patient is on maximal therapy for her chronic obstructive pulmonary disease including Symbicort, Spiriva, and nebulizer treatments. Also, on a prednisone taper presently. The patient was on Daliresp before, but she discontinued it as she was having significant side effects with nausea and diarrhea. I told her to resume her Daliresp and take it every other day to prevent any further exacerbations. She will continue all her maximal therapy along with oxygen supplementation as stated.  3. Diabetes. The patient was maintained on her Lantus and sliding scale insulin coverage along with her glipizide. She will resume that.  4. Hyperlipidemia. The patient was maintained on her lovastatin. She will resume that.  5. Yeast infection. She developed this as a result of being on antibiotics in the hospital. She was treated with one dose of Diflucan.  6. Anxiety. The patient was maintained on her Paxil and she will resume that upon discharge too.   CODE STATUS: The patient is a FULL CODE.        TIME SPENT WITH DISCHARGE: 35 minutes.   ____________________________ Rolly Pancake. Cherlynn Kaiser, MD vjs:ap D: 04/15/2012 16:11:37 ET T: 04/16/2012 10:59:32 ET JOB#:  [id number]  cc: Rolly PancakeVivek J. Cherlynn KaiserSainani, MD, <Dictator> Duncan Dulleresa [name], MD [place] SirenVIVEK J Krisi Azua MD ELECTRONICALLY SIGNED 04/16/2012 16:14

## 2014-12-27 ENCOUNTER — Ambulatory Visit (INDEPENDENT_AMBULATORY_CARE_PROVIDER_SITE_OTHER): Payer: Managed Care, Other (non HMO) | Admitting: Pulmonary Disease

## 2014-12-27 ENCOUNTER — Other Ambulatory Visit (INDEPENDENT_AMBULATORY_CARE_PROVIDER_SITE_OTHER): Payer: Managed Care, Other (non HMO)

## 2014-12-27 ENCOUNTER — Encounter: Payer: Self-pay | Admitting: Pulmonary Disease

## 2014-12-27 VITALS — BP 144/78 | HR 99 | Ht 63.0 in | Wt 264.0 lb

## 2014-12-27 DIAGNOSIS — J432 Centrilobular emphysema: Secondary | ICD-10-CM

## 2014-12-27 DIAGNOSIS — R911 Solitary pulmonary nodule: Secondary | ICD-10-CM

## 2014-12-27 DIAGNOSIS — G4733 Obstructive sleep apnea (adult) (pediatric): Secondary | ICD-10-CM | POA: Diagnosis not present

## 2014-12-27 NOTE — Addendum Note (Signed)
Addended by: Meyer CoryAHMAD, MISTY R on: 12/27/2014 03:21 PM   Modules accepted: Orders

## 2014-12-27 NOTE — Patient Instructions (Signed)
Take the azithromycin daily We will schedule a CT scan Use 3 L O2 at rest, 4 on exertion We will see you back in 3 months or sooner if needed

## 2014-12-27 NOTE — Assessment & Plan Note (Signed)
Catherine [name] has severe COPD as manifested by 4-5 exacerbations of COPD in the last year. Despite her very severe disease and pleased with the fact that she was not hospitalized in the last year. However, as noted in previous visits the recurrent exacerbations of COPD are very poor prognostic sign.  I'm disappointed in her compliance with her visits and we discussed this today. It does seem that she's been compliant with her inhaler therapy. In order to try to prevent exacerbations of COPD I would like to get her back on daily azithromycin as she noted that she had no side effects with this and the frequency of the exacerbations or less when she was taking it.  I reviewed the recent EKG and there is no underlying rhythm abnormality so she should have no problem taking the azithromycin. We will need to monitor her liver function test when she is on this medication.  Plan: -Restart daily azithromycin -Quarterly EKG and liver function tests, first liver function tests today Continue on her own Stay active exercise regularly Use oxygen as needed Follow-up 3 months

## 2014-12-27 NOTE — Progress Notes (Signed)
Subjective:    Patient ID: Catherine [name], female    DOB: 10/03/1955, 59 y.o.   MRN: [id number]  HPI Catherine [name] is a 59 year old female with history of COPD, HTN, GERD, pulmonary nodule, OSA, and allergic rhinitis.   Chief Complaint  Patient presents with  . Follow-up    pt last seen 11/2013.  c/o sob with exertion, some intermittent nonprod cough, chest tightness with coughing.  CAT score 30.     Catherine [name] has not been hospitalized since the last visit.  She had to call out EMS a few weeks back because her dyspnea has increased and has gotten worse.  She continue to use Anoro and albuterol.  She continues to use 3 L at rest and 4 with exertion. She has had 4-5 exacerbations of COPD in the last year, but no hospitalization.  She has seen rheumatology and they feel that her hand pain and swelling is osteoarthritis.    When she was taking the daily azithromycin she had less exacerbations and did not have a side effect.  Dr. Darrick Huntsman had to treat her with prednisone adn antibiotics about two weeks ago.  She says she took Azithromax and she just finished prednisone.    She is currently taking doxycycline for a skin infection.  Past Medical History  Diagnosis Date  . Diabetes mellitus   . Leukocytosis   . Hyperlipidemia   . COPD (chronic obstructive pulmonary disease)   . Hyperglycemia   . Hypertension   . S/P cardiac catheterization March 2012    normal coronaries,  due to chest pain ([place])  . Tobacco abuse, in remission     quit oct during hospitalization   . Screening for colon cancer June 2011    polyps found, next one due 2014 ([place])  . Gastritis and duodenitis June 2011    EGD  . Screening for breast cancer Jul 28 2011    normal  . GERD (gastroesophageal reflux disease)   . Obesity (BMI 30-39.9)   . Depression   . Anxiety      Review of Systems  Constitutional: Positive for fatigue. Negative for fever, chills, diaphoresis and appetite change.  HENT:  Positive for sore throat. Negative for congestion, postnasal drip, rhinorrhea, sinus pressure and sneezing.   Respiratory: Positive for cough and shortness of breath. Negative for choking, chest tightness and wheezing.   Cardiovascular: Negative for chest pain and leg swelling.    Objective:   Physical Exam Filed Vitals:   12/27/14 1448  BP: 144/78  Pulse: 99  Height:  (1.6 m)  Weight: 264 lb (119.75 kg)  SpO2: 96%  2L [place]  Gen: obese, no acute distress HENT: OP clear, TM's clear, neck supple PULM: CTA B, normal percussion CV: RRR, no mgr, trace edema GI: BS+, soft, nontender Derm: no cyanosis or rash Psyche: normal mood and affect   Records from primary care office were reviewed where she was recently treated for an exacerbation of COPD No recent chest imaging records are available for review Her EKG from April 2016 showed normal sinus rhythm with normal intervals and no evidence of conduction delays Her March 2015 CT chest was reviewed which showed multiple subcentimeter pulmonary nodules     Assessment & Plan:   COPD (chronic obstructive pulmonary disease) Catherine [name] has severe COPD as manifested by 4-5 exacerbations of COPD in the last year. Despite her very severe disease and pleased with the fact that she was not hospitalized in the last year.  However, as noted in previous visits the recurrent exacerbations of COPD are very poor prognostic sign.  I'm disappointed in her compliance with her visits and we discussed this today. It does seem that she's been compliant with her inhaler therapy. In order to try to prevent exacerbations of COPD I would like to get her back on daily azithromycin as she noted that she had no side effects with this and the frequency of the exacerbations or less when she was taking it.  I reviewed the recent EKG and there is no underlying rhythm abnormality so she should have no problem taking the azithromycin. We will need to monitor her liver  function test when she is on this medication.  Plan: -Restart daily azithromycin -Quarterly EKG and liver function tests, first liver function tests today Continue on her own Stay active exercise regularly Use oxygen as needed Follow-up 3 months   OSA (obstructive sleep apnea) Continue CPAP at night   Pulmonary nodule She has not had the repeat CT chest that was recommended a year ago. She had pulmonary nodules which need to be followed considering her heavy smoking history. She is a high risk patient for lung cancer.  Plan: -Repeat CT chest without contrast now     Updated Medication List Outpatient Encounter Prescriptions as of 12/27/2014  Medication Sig  . ACCU-CHEK SMARTVIEW test strip TEST BLOOD SUGAR FOUR TIMES DAILY  . albuterol (PROVENTIL HFA) 108 (90 BASE) MCG/ACT inhaler Inhale 2 puffs into the lungs every 6 (six) hours as needed for wheezing.  Marland Kitchen ALPRAZolam (XANAX) 0.25 MG tablet TAKE 1 TABLET BY MOUTH EVERY NIGHT AT BEDTIME AS NEEDED  . aspirin 81 MG EC tablet Take 81 mg by mouth daily.    Marland Kitchen atorvastatin (LIPITOR) 80 MG tablet TAKE 1 TABLET BY MOUTH EVERY DAY  . benzonatate (TESSALON) 200 MG capsule Take 1 capsule (200 mg total) by mouth 3 (three) times daily as needed for cough.  . butalbital-acetaminophen-caffeine (FIORICET, ESGIC) 50-325-40 MG per tablet Take 1 tablet by mouth every 4 (four) hours as needed for headache.  . chlorpheniramine-HYDROcodone (TUSSIONEX PENNKINETIC ER) 10-8 MG/5ML LQCR Take 5 mLs by mouth every 12 (twelve) hours as needed for cough.  . docusate sodium (COLACE) 100 MG capsule Take 200 mg by mouth daily.  Marland Kitchen doxycycline (DORYX) 100 MG DR capsule Take 100 mg by mouth 2 (two) times daily.  . furosemide (LASIX) 40 MG tablet TAKE 1 TABLET BY MOUTH EVERY DAY AS NEEDED FOR LEG SWELLING AND TO MAINTAIN WEIGHT.  Marland Kitchen glipiZIDE (GLUCOTROL) 5 MG tablet TAKE 1 TABLET BY MOUTH BEFORE BREAKFAST AND 2 TABLET BY MOUTH BEFORE DINNER  . hydroxychloroquine  (PLAQUENIL) 200 MG tablet Take 1 tablet by mouth daily.  . Insulin Glargine (LANTUS SOLOSTAR) 100 UNIT/ML Solostar Pen Inject 30 Units into the skin daily at 10 pm.  . insulin glargine (LANTUS) 100 UNIT/ML injection Inject 0.25 mLs (25 Units total) into the skin at bedtime.  . Insulin Syringe-Needle U-100 (INSULIN SYRINGE .5CC/31GX5/16") 31G X 5/16" 0.5 ML MISC Test twice a day  . ipratropium-albuterol (DUONEB) 0.5-2.5 (3) MG/3ML SOLN USE 1 VIAL VIA NEBULIZER EVERY 6 HOURS AS NEEDED  . latanoprost (XALATAN) 0.005 % ophthalmic solution Place 1 drop into both eyes at bedtime.  Marland Kitchen NOVOLOG FLEXPEN 100 UNIT/ML FlexPen INJECT 10 UNITS UNDER THE SKIN THREE TIMES DAILY BEFORE MEALS  . pantoprazole (PROTONIX) 40 MG tablet TAKE 1 TABLET BY MOUTH TWICE DAILY  . PARoxetine (PAXIL) 10 MG tablet Take 1 tablet (  10 mg total) by mouth every morning.  . Potassium Gluconate 595 MG CAPS Take 2 capsules by mouth daily.   Marland Kitchen. Umeclidinium-Vilanterol (ANORO ELLIPTA) 62.5-25 MCG/INH AEPB Inhale 1 puff into the lungs daily.  . [DISCONTINUED] azithromycin (ZITHROMAX) 500 MG tablet Take 1 tablet (500 mg total) by mouth daily. (Patient not taking: Reported on 12/27/2014)  . [DISCONTINUED] predniSONE (DELTASONE) 10 MG tablet 6 tablets twice daily for 3 days,  Then start a 6 day tapering dose by  10 mg daily until gone (Patient not taking: Reported on 12/27/2014)

## 2014-12-27 NOTE — Assessment & Plan Note (Signed)
She has not had the repeat CT chest that was recommended a year ago. She had pulmonary nodules which need to be followed considering her heavy smoking history. She is a high risk patient for lung cancer.  Plan: -Repeat CT chest without contrast now

## 2014-12-27 NOTE — Addendum Note (Signed)
Addended by: Velvet BatheAULFIELD, ASHLEY L on: 12/27/2014 03:24 PM   Modules accepted: Orders

## 2014-12-27 NOTE — Assessment & Plan Note (Signed)
Continue CPAP at night ?

## 2014-12-28 LAB — HEPATIC FUNCTION PANEL
ALK PHOS: 152 U/L — AB (ref 39–117)
ALT: 27 U/L (ref 0–35)
AST: 34 U/L (ref 0–37)
Albumin: 4 g/dL (ref 3.5–5.2)
Bilirubin, Direct: 0.1 mg/dL (ref 0.0–0.3)
Total Bilirubin: 0.4 mg/dL (ref 0.2–1.2)
Total Protein: 7.2 g/dL (ref 6.0–8.3)

## 2014-12-28 NOTE — Addendum Note (Signed)
Addended by: Velvet BatheAULFIELD, ASHLEY L on: 12/28/2014 05:18 PM   Modules accepted: Orders

## 2014-12-29 NOTE — Consult Note (Signed)
PATIENT NAME:  Catherine [name], Catherine [name] MR#:  [id number] DATE OF BIRTH:  02/18/1956  DATE OF CONSULTATION:  01/03/2013  REFERRING PHYSICIAN:   Dr. Sharma Covert CONSULTING PHYSICIAN:  Rolly Pancake. Cherlynn Kaiser, MD  PRIMARY CARE PHYSICIAN:  Dr. Darrick Huntsman.   REASON FOR CONSULTATION: Shortness of breath and lower extremity edema.   HISTORY OF PRESENT ILLNESS: This is a 59 year old female who presents to the hospital with a few day history of shortness of breath getting worse and also some swelling in the lower extremities. The patient has had multiple hospitalizations to this hospital over the past year for respiratory failure secondary to her chronic obstructive pulmonary disease exacerbation. She was last hospitalized from 12/01/2012 to  12/08/2012. She said when she left the hospital, she felt great for the past two weeks, but that over the past few days, she has had progressive shortness of breath. She also admits to a cough, but it is nonproductive. She denies any chills. She did have she did have a GI illness last week, but the symptoms of those have now resolved. She also complains of some worsening lower extremity edema. She therefore came to the ER for further evaluation. She not noted to be hypoxic with room air oxygen saturation at 94% at triage, although on ambulation, she does desaturate. Hospitalist services were contacted for a possible admission for underlying chronic obstructive pulmonary disease and lower extremity edema.   REVIEW OF SYSTEMS: CONSTITUTIONAL: No documented fever, +10 pounds weight gain, no weight loss.  EYES: No blurred or double vision.  ENT: No tinnitus. No postnasal drip. No redness of the oropharynx.  RESPIRATORY: No cough, no wheeze. No hemoptysis. Positive dyspnea. Positive chronic obstructive pulmonary disease.  CARDIOVASCULAR: No chest pain, no orthopnea, no palpitations, no syncope.  GASTROINTESTINAL: No nausea. No vomiting. No diarrhea. No abdominal pain, no melena or hematochezia.   GENITOURINARY: No dysuria or hematuria.  ENDOCRINE: No polyuria or nocturia. No heat or cold intolerance.  HEMATOLOGIC: No anemia. No bruising, no bleeding.  INTEGUMENTARY: No rashes. No lesions.  MUSCULOSKELETAL: No arthritis, no swelling, no gout.   NEUROLOGIC: No numbness. No tingling. No ataxia. No seizure-type activity.  PSYCHIATRIC: Positive anxiety. No insomnia. No ADD.   PAST MEDICAL HISTORY:  1.  Consistent with chronic obstructive pulmonary disease, oxygen dependent, end-stage.  2.  Diabetes.  3.  Morbid obesity.  4.  Anxiety.  5.  Hyperlipidemia. 6.  GERD.  7.  Chronic pain.   ALLERGIES: ASPIRIN, CODEINE, IBUPROFEN, LORCET, MORPHINE, PERCOCET, SULFA DRUGS AND VICODIN.   SOCIAL HISTORY: Used to be a heavy smoker, quit about 2 to 3 years ago. No alcohol abuse. No illicit drug abuse. Lives at home with her husband.   FAMILY HISTORY: Mother had coronary disease and bypass. Family history of diabetes involving her father and brother and sister.   CURRENT MEDICATIONS: Fioricet 1 tab every 4 hours as needed, DuoNeb to q. 4 hours as needed, Xanax 0.25 mg at bedtime as needed, aspirin 81 mg daily, atorvastatin 80 mg at bedtime, chlorpheniramine with hydrocodone 5 mL b.i.d. as needed for cough, Daliresp 500 mcg 1 tab every other day only Monday, Wednesday and Friday, Dilaudid 2 mg at bedtime as needed for pain, Lasix 40 mg daily, glipizide 5 mg b.i.d., glargine insulin 35 units in the morning, 70 units at bedtime, Lasix 20 mg daily. NovoLog sliding scale, Protonix 40 mg b.i.d., Paxil 10 mg daily, potassium gluconate 595  mg tablet one daily, prednisone 10 mg daily, Spiriva 1 puff daily, albuterol  inhaler 2 puffs q. 6 hours as needed, Symbicort 2 puffs b.i.d., tramadol 50 mg q. 6 hours as needed.   PHYSICAL EXAMINATION: VITAL SIGNS: Temperature is 97.1, pulse 92, respirations 20, blood pressure 148/74, saturations 96% on 2 liters nasal cannula.  GENERAL: She is a pleasant-appearing  female anxious but in no apparent distress.  HEENT: Atraumatic, normocephalic. Extraocular muscles are intact. Pupils equal, reactive to light. Sclerae is anicteric. No conjunctival injection. No pharyngeal erythema.  NECK: Supple. There is no jugular venous distention, no bruits, no lymphadenopathy, no thyromegaly.   HEART: Regular rate and rhythm. No murmurs, rubs or clicks.  LUNGS: She has poor air entry bilaterally. No wheezing, no rhonchi. No rails. Negative use of accessory muscles. No dullness to percussion.  ABDOMEN: Soft, flat, nontender, nondistended. Has good bowel sounds. No hepatosplenomegaly appreciated.  EXTREMITIES: No evidence of any cyanosis or clubbing, does have +2 pitting edema from the knees to the ankles bilaterally.  SKIN: Moist and warm with no rashes.  LYMPHATIC: There is no cervical or axillary lymphadenopathy.  NEUROLOGIC: She is alert, awake, and oriented x 3 with no focal motor or sensory deficits appreciated bilaterally.   LABORATORY, DIAGNOSTIC AND RADIOLOGIC DATA:  Serum glucose 161, BUN 8, creatinine 0.7, BNP of 34, sodium 143, potassium 3.4, chloride 107, bicarbonate 28,  CK is 490, troponin less than 0.02. White cell count 9.2, hemoglobin 12.6, hematocrit 36.9, platelet count 392.   ABG showed a pH of 7.33, pCO2 of 53, pO2 of 91, sats 96%.   The patient did have a chest x-ray done, which showed no evidence of congestive heart failure or pneumonia, mildly increased interstitial markings which appears to be stable.   ASSESSMENT AND PLAN: This is a 59 year old female with a history of end-stage chronic obstructive pulmonary disease, diabetes, hypertension, anxiety, gastroesophageal reflux disease,  chronic pain syndrome, who presents to the Emergency Room due to worsening shortness of breath and lower extremity edema.  1.  Shortness of breath. This is likely secondary to her underlying chronic obstructive pulmonary disease, which is end stage. I do not appreciate  any evidence of chronic obstructive pulmonary disease exacerbation presently. She has no evidence of pneumonia or acute bronchitis or congestive heart failure. For now, I will continue her home medications including Symbicort, Spiriva, Daliresp, oxygen and DuoNeb as needed. She will be discharged on a 12-day prednisone taper. I did suggest getting hospice or palliative care involved for symptom management, but she does not want to do it at this point, she wants to think about it. I will give her one dose of Roxanol here in the Emergency Room and give her a prescription for Roxanol as needed for air hunger, which will likely help her symptoms. I did suggest not to use her Roxanol along with her Dilaudid and Xanax as it will lower her respiratory drive and she understands.  2.  Lower extremity edema. This is likely dependent edema secondary to poor mobility. No evidence of congestive heart failure. Her BNP is only 34. Chest x-ray appears to be clear and she has no rales on physical exam. For now, I would suggest increasing the Lasix to twice a day from daily. I would keep her feet elevated while she is sitting and also apply stockings to her legs. She will follow up with her primary care physician.  3.  Anxiety. I would continue her Xanax and Paxil.  4.  Diabetes. I would continue her Lantus and glipizide SR.  5.  Hyperlipidemia. Continue  atorvastatin.  6.  Chronic pain syndrome. I would continue with tramadol and Dilaudid. I did advise to her not to use her Xanax and Dilaudid and Roxanol together as can cause respiratory depression as mentioned, she is aware.   The patient is going to be discharged home. She is in agreement with this plan.  I Discussed plan of care with the ER physician and also with her primary care physician, Dr. Darrick Huntsmanullo.   CODE STATUS:  The patient is a full code   TIME SPENT WITH THE CONSULT: 55 minutes    ____________________________ Rolly PancakeVivek J. Cherlynn KaiserSainani, MD vjs:cc D: 01/03/2013  17:26:11 ET T: 01/03/2013 18:20:26 ET JOB#: [id number]  cc: Rolly PancakeVivek J. Cherlynn KaiserSainani, MD, <Dictator> Houston SirenVIVEK J SAINANI MD ELECTRONICALLY SIGNED 01/04/2013 19:42

## 2014-12-29 NOTE — H&P (Signed)
PATIENT NAME:  Catherine Catherine [name], Catherine Catherine [name] MR#:  [id number] DATE OF BIRTH:  [birth year]  DATE OF ADMISSION:  08/28/2012  REASON FOR ADMISSION: Shortness of breath.   PRIMARY CARE PHYSICIAN: Catherine Dulleresa [name], [name]  HISTORY OF PRESENT ILLNESS: The patient is Catherine [name] very nice 59 year old female who has history of COPD, chronic respiratory failure, who has been recently hospitalized on 07/22/2012 with chest pain which was likely due to COPD exacerbation. The patient was recently taken off steroids about 5 days ago and soon after she stopped the steroids, she started having symptoms. Her symptoms are mostly cough and increased shortness of breath. She cannot get out of the bed and move 2 or 3 steps without getting significant shortness of breath. The patient has been seeing Dr. Duncan Dulleresa [name], who last time put her on Catherine [name] very long taper of steroids but again, after she comes off of the steroids, she gets really short of breath with severe exacerbation. The patient comes today with increased cough, shortness of breath, and she has been having Catherine [name] fever of 100.4 for the past 4 or 5 days. There is nobody else sick at home. No rhinorrhea. No nasal discharge. No joint pains or flu symptoms.   REVIEW OF SYSTEMS:  CONSTITUTIONAL: Positive fever. No weight loss. Positive weight gain due to steroids.  EYES: No blurred vision or double vision.  ENT: No tinnitus. No postnasal drip. No pain in oropharynx.  RESPIRATORY: Positive cough. Positive shortness of breath. Positive wheezing. No hemoptysis. The patient has COPD.  CARDIOVASCULAR: Negative chest pain. No orthopnea, no palpitations, no syncope.  GASTROINTESTINAL: No nausea or vomiting. No diarrhea at this moment, but she had Catherine [name] virus going on within the last couple of weeks, but her diarrhea resolved.  GENITOURINARY: No dysuria or hematuria.  ENDOCRINE: No polyuria, polydipsia or polyphagia. No cold or heat intolerance. She has diabetes. Her blood sugars have been elevated due to the  steroids.  HEMATOLOGIC: No anemia. No bruising. No bleeding.  SKIN: No rashes. No lesions.  MUSCULOSKELETAL: No arthritis. No swelling. No gout.  NEUROLOGIC: No numbness, tingling, CVAs or TIAs.  PSYCHIATRIC: Positive mild anxiety. No depression.   PAST MEDICAL HISTORY:  1.  Hypertension.  2.  Hyperlipidemia.  3.  Chronic respiratory failure, oxygen dependent at 2 liters nasal cannula.  4.  COPD.  5.  GERD.  6.  Depression, anxiety. 7.  Diabetes.   ALLERGIES: ASPIRIN, CODEINE, IBUPROFEN, LORCET, PERCOCET AND VICODIN.   PAST SURGICAL HISTORY: Tonsillectomy, hemorrhoidectomy and hysterectomy.  FAMILY HISTORY: Positive for coronary artery bypass graft at the age of 59 in her mother, and her father, brother and sister have diabetes.   SOCIAL HISTORY: The patient lives with her husband and has 2 sons. She quit several years ago; she smoked for Catherine [name] long time, 1 to 1-1/2 packs Catherine [name] day. She does not drink. She does not use any drugs.   MEDICATIONS: Aspirin 81 mg Catherine [name] day.   DICTATION ENDS HERE   ____________________________ Catherine Furnaceoberto Sanchez Gutierrez, [name] rsg:jm D: 08/28/2012 18:58:01 ET T: 08/28/2012 21:35:12 ET JOB#: [id number]  cc: Catherine Furnaceoberto Sanchez Gutierrez, [name], <Dictator> Catherine Catherine [name] ELECTRONICALLY SIGNED 09/09/2012 7:34

## 2014-12-29 NOTE — Discharge Summary (Signed)
PATIENT NAME:  Catherine [name] MR#:  [id number] DATE OF BIRTH:  1956/04/16  DATE OF ADMISSION:  12/01/2012 DATE OF DISCHARGE:  12/08/2012  REASON FOR ADMISSION: Shortness of breath, increased from baseline.   DISPOSITION: Home. Referral to pulmonary rehabilitation services, outpatient.   MEDICATIONS AT DISCHARGE: Symbicort 160/4.5, 2 puffs twice daily; Protonix 40 mg twice daily; Proventil 90 mg, 2 puffs every 6 hours; alprazolam 0.25 mg once at bedtime as needed for anxiety; aspirin, enteric-coated, 81 mg once [name] day; glipizide 5 mg twice daily; albuterol/ipratropium nebulizers every 4 hours p.r.n. as needed; potassium gluconate 595 mg once [name] day; Daliresp 500 mcg every other day - Monday, Wednesday and Friday; Spiriva 18 mcg once [name] day; tramadol 50 mg every 6 hours; atorvastatin 80 mg every day; chlorpheniramine/hydrocodone 8 mg/10 mg, 5 mL every 12 hours; Paxil 10 mg once [name] day, NovoLog Flex Pen 8 units 3 times [name] day before meals; Lasix 20 mg once [name] day; insulin, lantus 7 units once [name] day at night and 35 units once [name] day in the morning; Diflucan 150 mg once [name] day; Nystatin oral suspension 3 times daily for 10 days; prednisone taper starting at 60 mg; Fioricet once every 4 hours; amoxicillin with clavulanic XR, 1000 mg twice daily for 8 days.   FOLLOWUP: Dr. Duncan Dulleresa [name]. Discharged on 2 liters nasal cannula.   IMPORTANT RESULTS: BUN is 19, creatinine 0.68. Blood sugars in the 200s, is starting to come down; on the last day 179, 149.   White blood cells 14,000, hemoglobin 12.7.   Echo Doppler showing left ventricular ejection fraction of 55 to 60%, normal systolic function, impaired relaxation pattern of left ventricle diastolic filling/diastolic dysfunction. Normal right ventricular size and systolic function. No significant valve disease. Normal right ventricular systolic pressures.   Chest x-ray shows minimal density adjacent to the major fissure on lateral views, likely represents  atelectasis of the lingula.      HOSPITAL COURSE: The patient is [name] very nice 59 year old female who I have known from previous hospitalizations. She was admitted on 12/01/2012 by Dr. Rudene Rearwish; please refer to his  H and P for more information. Overall, the patient came with increased shortness of breath  5 days prior. This admission, the patient noticed significant edema of the lower extremities and significant wheezing. The patient had [name] nonproductive cough, shortness of breath and was not improving, for why she came to the hospital.   In the hospital she was seen to be in acute-on-chronic respiratory failure, with [name] respiratory rate of 30, pulse of 93, and an oxygen saturation of 97%, requiring more oxygen. Her baseline is  2 liters.   The patient was admitted to treat the exacerbation of acute-on-chronic pulmonary disease.   She was started on Solu-Medrol IV, and Rocephin and azithromycin for antibiotics.   An echocardiogram was done due to possible CHF, diastolic dysfunction. The patient had diastolic dysfunction and edema of the lower extremities, although clinically she did not sound wet, at least during my own evaluation. By Dr. Renae GlossWieting on 12/02/2012 the patient was fine, and on his respiratory exam he just noted decreased breath sounds bilaterally.   There have not been any crackles or signs of increased fluid on the lungs.   Actually, the echocardiogram showed normal right ventricular pressures which would not correlate to pulmonary edema in general.   The patient got some Lasix and her edema has improved, although her respiratory pattern did not change for why I am not going  to call this [name] CHF exacerbation as the patient did not show any significant pulmonary edema.   The patient has been educated about blood pressure management and to decrease sodium in the diet as she could have, in the future, problems with CHF due to her diastolic dysfunction.   Again, this was not CHF  exacerbated. It was all compensated.   The patient received antibiotics, steroids. Steroids need to be done on [name] very slow taper as the patient has very slow improvement of her symptoms.    The patient is discharged in good condition, to see Dr. Duncan Dull. Please, Dr. Darrick Huntsman, call me at [phone] if you have any specific questions.   I spent about 35 minutes with this discharge.    ____________________________ Felipa Furnace, MD rsg:dm D: 12/09/2012 07:19:00 ET T: 12/09/2012 07:37:04 ET JOB#: [id number]  cc: Duncan Dull, MD Felipa Furnace, MD, <Dictator>    Jadie Comas Juanda Chance MD ELECTRONICALLY SIGNED 12/16/2012 22:34

## 2014-12-29 NOTE — H&P (Signed)
PATIENT NAME:  Catherine [name], Catherine [name] MR#:  [id number] DATE OF BIRTH:  November 15, 1955  DATE OF ADMISSION:  08/28/2012  ADDENDUM:  MEDICATIONS:  1.  Xanax 0.25 mg at bedtime. 2.  Aspirin 81 mg a day. 3.  Lipitor 80 mg a day. 4.  Daliresp 500 mcg once a day. 5.  Zetonna 37 mcg twice daily. 6.  Demerol 50 mg every 6 hours. 7.  Symbicort 160/4.5, two puffs twice daily. 8.  Spiriva 18 mcg once a day. 9.  Proventil. 10.  Prednisone taper has been finished. 11.  Potassium gluconate 595 mg once a day. 12.  Protonix 40 mg once a day. 13.  NovoLog insulin sliding scale. 14.  Lantus 20 units once a day. 15.  Glipizide 5 mg twice daily.  16.  Docusate 100 mg twice daily.  17.  Albuterol ipratropium nebs.   PHYSICAL EXAMINATION: VITAL SIGNS: Blood pressure 140/57, pulse 89, respiratory 23, temperature 98.2.  GENERAL: The patient is alert, oriented x 3, in mild respiratory distress, hemodynamically stable.  HEENT: Her pupils are equal and reactive. Extraocular movements are intact. Mucosa moist. The patient is on oxygen nasal cannula at 2 liters per minute.  NECK: Supple. No JVD. No thyromegaly. No adenopathy.  CARDIOVASCULAR: Regular rate and rhythm. No murmurs, rubs, or gallops. No displacement of PMI. No tenderness to palpation of anterior chest.  LUNGS: Positive crackles and rhonchi, diffuse in both respiratory fields.  Positive use of accessory muscles, no dullness to percussion.  ABDOMEN: Soft, nontender, nondistended. No hepatosplenomegaly. No masses. Bowel sounds are positive.   GENITAL: Deferred.  EXTREMITIES: No edema, no cyanosis, no clubbing. The patient has swelling of the knuckles but it is not pitting edema.  NEUROLOGIC: Cranial nerves II through XII intact.  PSYCHIATRIC: Negative for agitation. The patient is alert, oriented x 3.  LYMPHATIC: Negative for lymphadenopathies in neck or supraclavicular area.  SKIN: Without any rashes or petechiae.  MUSCULOSKELETAL: No significant joint  deformity.   LABORATORY AND DIAGNOSTIC DATA:  Glucose 187, BUN 10, creatinine 0.81, sodium 141, potassium 3.5, calcium 8.3, total protein 6.9, albumin 2.8, alkaline phosphatase 137, troponin 0.02. White count 13.4, hemoglobin 11.9, platelets 225.  Chest x-ray: No significant infiltrates.   ASSESSMENT AND PLAN:  A 59 year old female with history of chronic obstructive pulmonary disease, acute on chronic respiratory failure, diabetes, hypertension, who comes with worsening shortness of breath after been taken off steroids.  1.  Chronic obstructive pulmonary disease exacerbation. The patient having increased respiratory distress, shortness of breath and she has been getting very short of breath with minimal exertion for what I am going to admit her, treatment with steroids intravenously at high doses and very slow taper. Nebulizers every 4 hours. We are going to treat her with antibiotics, Levaquin 750 mg once a day due to the fact that the patient has mild fever of 100.4 mostly at home for the past 5 days. The patient is going to continue her oxygen at 2 liters and titrate if necessary. Possibly the patient will need long-term steroids, possibly for life. Pulmonary toilet recommended as well. Ambulation as much as possible.  2.  Acute on chronic respiratory failure. The patient had increased shortness of breath, oxygen saturations have been around low 90s, but not below 92 for what we are leaving her on her normal oxygen supply. The patient has 2 liters at home usually.  3.  Tachycardia, likely due to her shortness of breath. We are going avoid giving her some fluids, just  on the suspicions of possible congestive heart failure. Due to her shortness of breath I am going to get an echocardiogram since it has not been checked. The patient had also a CT scan of the chest with intravenous contrast done [facility], for what we are going to request those records to rule out the possibility of pulmonary  embolus.  4.  Diabetes. The patient has uncontrolled diabetes. We are going to try to adjust her medications while she is in the hospital.  For now, I am going to just put her on her normal dose.  5.  Hypertension. Continue treatment with home medications.  6. Other medical problems are stable. The patient on deep vein thrombosis prophylaxis with heparin, gastrointestinal prophylaxis with proton pump inhibitor.  TIME SPENT: I spent about 45 minutes with this admission.   CODE STATUS:  The patient is a full code.   ____________________________ Felipa Furnaceoberto Sanchez Gutierrez, MD rsg:ct D: 08/28/2012 19:36:00 ET T: 08/29/2012 07:44:34 ET JOB#: [id number]  cc: Felipa Furnaceoberto Sanchez Gutierrez, MD, <Dictator> Anshul Meddings Juanda ChanceSANCHEZ GUTIERRE MD ELECTRONICALLY SIGNED 09/09/2012 7:35

## 2014-12-29 NOTE — Consult Note (Signed)
Brief Consult Note: Diagnosis: 1. COPD - end stage 2. DM 3. HTN 4. Anxiety 5. GERD 6. Chronic pain.   Patient was seen by consultant.   Consult note dictated.   Orders entered.   Discussed with Attending MD.   Comments: 59 yo female w/ hx of end-stage COPD, DM, HTN, anxiety, GERD, Chronic pain came into ER today due worsening shortness of breath and LE edema.   1. Shortness of breath - likely due to underlying COPD which is end-stage.  No evidence of acute exacerbation.  - will d/c home w/ Prednisone taper - cont. symbicort, Spiriva, Daliresp, O2 at home.  - suggested Palliative care/hospice for symptom management and she wants to think about it.  - will give one dose of Roxanol now and d/c home on some PO Roxanol for symptom management.   2. LE Edema - likely dependent edema from poor mobility.  - BNP is 34 and unlikely pt. is in CHF.  Would increase Lasix dose to BID for week and consider stockings for her legs.   3. Anxiety - cont. Xanax, Paxil  4. DM - cont. Lantus, Glipizide 5. Hyperlipidemia - cont. Atorvastatin 6. Chronic pain syndrome - cont. Dilaudid PRN. I advised her not to use Xanax, Dilaudid and Roxanol together as it can cause resp. depression.  She is aware.   d/c home. Discussed plan of care w/ pt, ER physican and also PCP (Dr. Darrick Huntsmanullo)  Full Code Job # [phone].  Electronic Signatures: [place] SirenSainani, Marylin Lathon J (MD)  (Signed 28-Apr-14 17:26)  Authored: Brief Consult Note   Last Updated: 28-Apr-14 17:26 by [place] SirenSainani, Kataya Guimont J (MD)

## 2014-12-29 NOTE — H&P (Signed)
PATIENT NAME:  Catherine Catherine [name], Catherine Catherine [name] MR#:  [id number] DATE OF BIRTH:  21-Feb-1956  DATE OF ADMISSION:  12/01/2012  PRIMARY CARE PHYSICIAN: Dr. Duncan Dulleresa [name].   REFERRING PHYSICIAN: Dr. Maurilio LovelyNoelle McLaurin.   CHIEF COMPLAINT: Increased shortness of breath.   HISTORY OF PRESENT ILLNESS: The patient is Catherine [name] 59 year old, pleasant, Caucasian female with Catherine [name] history of chronic obstructive pulmonary disease and chronic respiratory failure, systemic hypertension and diabetes mellitus. On Friday, 5 days ago, she noticed edema in her hands and lower extremities. She was seen here Catherine [name] few days ago and treated for that. However, over the last few days, she is getting more short of breath and having wheezing. She has Catherine [name] nonproductive cough. Her symptoms of shortness of breath had worsened and wheezing as well. She had been taking nebulization treatments every 4 hours, and in between, she will take her inhalers as well. Despite that, she did not reach satisfactory improvement. She denies having any fever. No chills. No chest pain and no orthopnea. It is worth to mention that the patient gained about 8 pounds within 5 days, but she lost 5 pounds after taking Catherine [name] dose of oral Lasix.   REVIEW OF SYSTEMS:   CONSTITUTIONAL: Denies any fever. No chills. She has only mild fatigue.  EYES: No blurring of vision. No double vision.  ENT: No hearing impairment. No sore throat. No dysphagia.  CARDIOVASCULAR: She has shortness of breath and wheezing and mild peripheral edema. No chest pain, but occasionally when she coughs, she might have soreness in the chest wall.  RESPIRATORY: She has worsening shortness of breath and wheezing as above. Dry cough. No hemoptysis. No sputum production.  GASTROINTESTINAL: No abdominal pain, no vomiting, no diarrhea.  GENITOURINARY: No dysuria. No frequency of urination.  MUSCULOSKELETAL: No joint pain or swelling. No muscular pain or swelling.  INTEGUMENTARY: No skin rash. No ulcers.  NEUROLOGY: No focal  weakness. No seizure activity. No headache.  PSYCHIATRY: She has history of anxiety and depression, but right now, she is not anxious and does not feel depressed.  ENDOCRINE: No polyuria or polydipsia. No heat or cold intolerance.   PAST MEDICAL HISTORY: Chronic obstructive pulmonary disease, chronic respiratory failure, systemic hypertension, diabetes mellitus type 2, hyperlipidemia, gastroesophageal reflux disease and obesity.   PAST SURGICAL HISTORY: Hysterectomy, hemorrhoidectomy and tonsillectomy.   FAMILY HISTORY: Her mother had premature coronary artery disease and had coronary artery bypass graft in her 5850s. There is Catherine [name] family history of diabetes involving her father, Catherine [name] brother and Catherine [name] sister.   SOCIAL HISTORY: She is married, living with her husband. She has 4 children; 2 of them are still living with her. 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 She also worked on and off in Catherine [name] [employer] for about 10 years.   SOCIAL HABITS: Ex-chronic smoker. She quit many years ago. She used to smoke 1 to 1-1/2 packs Catherine [name] day for over 40 years.  She does not have Catherine [name] history of alcohol or other drug abuse.   ADMISSION MEDICATIONS: She is using nebulization of DuoNebs q.4 hours and in between she uses inhalers, using Proventil and also Spiriva 18 mg once Catherine [name] day, Symbicort 160/4.5 two puffs twice Catherine [name] day, pantoprazole 40 mg twice Catherine [name] day, alprazolam 0.25 mg once Catherine [name] day at bedtime as needed, aspirin 81 mg Catherine [name] day, glipizide 5 mg twice Catherine [name] day, Daliresp 500 mg once Catherine [name] day, tramadol 50 mg q.6 hours p.r.n., Lipitor 80 mg once Catherine [name] day, docusate 100 mg  twice Catherine [name] day, Tussionex cough suppression 5 mL twice Catherine [name] day p.r.n., Paxil 10 mg Catherine [name] day, Lantus 20 units in the morning. She is also on sliding scale as needed using NovoLog.   ALLERGIES: MORPHINE causes nausea and vomiting. LORCET, CODEINE, PERCOCET, VICODIN AND SULFA. Unfortunately, the record shows that she is allergic to ASPIRIN; however, she is taking aspirin.    PHYSICAL EXAMINATION:  VITAL SIGNS: Blood pressure 169/95, respiratory rate was 30, pulse 93, temperature 97.9, oxygen saturation 97%.  GENERAL APPEARANCE: Middle-aged female sitting on the stretcher in no severe distress.  HEAD AND NECK: No pallor. No icterus. No cyanosis. Ear examination revealed normal hearing. No discharge, no ulcers. Examination of the nose showed no discharge. No bleeding. No ulcers. Oropharyngeal area showed no oral thrush, no exudates. Eye examination revealed normal eyelids and conjunctivae. Pupils about 4 to 5 mm, equal and reactive to light. Neck supple. Trachea at midline. No thyromegaly. No cervical lymphadenopathy. No masses.  HEART: Normal S1, S2. No S3, S4. No murmur. No gallop. No carotid bruits.  RESPIRATORY: Moderate tachypnea without use of accessory muscles. No rales. Scattered mild wheezing bilaterally. Prolonged expiratory phase. Generally, there are decreased breathing sounds bilaterally.  ABDOMEN: Soft, obese, without tenderness. No hepatosplenomegaly. No masses. No hernias.  SKIN: No ulcers. No subcutaneous nodules.  MUSCULOSKELETAL: No joint swelling. No clubbing.  NEUROLOGIC: Cranial nerves II through XII are intact. No focal motor deficit.  PSYCHIATRIC: The patient is alert and oriented x3. Mood and affect were normal.   LABORATORY FINDINGS AND EKG: Her EKG showed normal sinus rhythm at rate of 95 per minute. Unremarkable EKG. Chest x-ray showed heart size is normal. No acute cardiopulmonary abnormalities. Serum glucose 158, BUN 8, creatinine 0.6, sodium 140, potassium 3.3. Troponin less than 0.02. CBC showed white count of 11,000, hemoglobin 13, hematocrit 39 and platelet count 342.   ASSESSMENT:  1. Acute exacerbation of chronic obstructive pulmonary disease.  2. Chronic respiratory failure, home oxygen dependent on 2 liters.  3. Peripheral edema, worsened with the spheroid use.  4. Systemic hypertension.  5. Diabetes mellitus, type 2.  6.  Hyperlipidemia.  7. Gastroesophageal reflux disease.   PLAN: Will admit the patient and intensify treatment with bronchodilator therapy using DuoNebs q.4 hours p.r.n., along with Spiriva 1 inhalation once Catherine [name] day. I will start Rocephin 1 g daily, although I am not sure giving antibiotic is going to be of help or not. I will hold the prednisone and start the patient on IV Solu-Medrol. Continue glargine or Lantus insulin, along with Accu-Chek and sliding scale if needed. Oxygen supplementation. Continue the rest of her home medications as listed above.   Time spent in evaluating this patient and reviewing medical records took more than 55 minutes.    ____________________________ Carney Corners. Rudene Re, MD amd:gb D: 12/01/2012 23:43:21 ET T: 12/02/2012 00:48:45 ET JOB#: [id number]  cc: Carney Corners. Rudene Re, MD, <Dictator> Karolee Ohs Dala Dock MD ELECTRONICALLY SIGNED 12/03/2012 7:10

## 2014-12-29 NOTE — Discharge Summary (Signed)
PATIENT NAME:  Catherine [name], Denyla A MR#:  [id number] DATE OF BIRTH:  [birth year]  DATE OF ADMISSION:  08/28/2012 DATE OF DISCHARGE:  08/31/2012  REASON FOR ADMISSION: Shortness of breath.   DIAGNOSES AT DISCHARGE:  1. Acute on chronic respiratory failure with increased shortness of breath, increase of oxygen demand.  2. Chronic obstructive pulmonary disease with exacerbation.  3. Tachycardia due to shortness of breath and respiratory distress.  4. Type 2 diabetes, uncontrolled.  5. Hypertension.  6. Anxiety and depression.  7. Dyslipidemia.  8. Gastroesophageal reflux disease.   DISPOSITION: Home.   FOLLOWUP: With primary care physician, Dr. Duncan Dulleresa [name].   MEDICATIONS AT DISCHARGE:  1. Symbicort 160/4.5 mcg 2 puffs twice daily.  2. Pantoprazole 40 mg twice daily.  3. Proventil HFA 90 mcg 2 puffs every 6 hours.  4. Alprazolam 0.25 mg once a day at bedtime as needed.  5. Aspirin 81 mg once a day.  6. Zetonna 37 mcg nasal aerosol.  7. Glipizide 5 mg tablet.  8. Albuterol p.r.n.  9. Spiriva 18 mg once daily.  10. Daliresp 500 mg every other day.  11. Tramadol 50 mg every 6 hours.  12. Lipitor 80 mg once a day.  13. Docusate 100 mg, take 2 once a day.  14. Tussionex Pennkinetic.  15. Paxil 10 mg a day.  16. Prednisone taper.  17. Insulin glargine 28 units once a day.  18. NovoLog Flex Pen 8 units 3 times a day before meals. This is a new medication.  19. Levaquin 500 mg once a day for 7 days.   HOSPITAL COURSE: The patient is a nice 59 year old female with history of COPD, chronic respiratory failure on oxygen who came with chest pain which was likely due to COPD on 07/22/2012. She was discharged in good condition on a high dose of steroids with taper. The patient stopped the steroids 5 days prior to this admission, and she started to have significant symptoms. Her symptoms were cough and increased shortness of breath to the point that she was not able to get out of bed and  ambulate.   The patient was admitted. At that moment, she had a slight fever of 100.4 degrees for 4 or 5 days for which she was treated with antibiotics for COPD exacerbation.   1. As far as her acute on chronic respiratory failure, she was able to be titrated down and put back on 2 liters of oxygen, likely with the help of the steroids and severe pulmonary toilet.   She was kept on Levaquin. She was given 750 mg once a day and discharged on 500. Her white count was elevated due to the steroids.   Her steroids need to be titrated and tapered down very slowly to prevent any significant bounce back or decompensation.   The last time she was discharged with prednisone twice daily at very large doses, and her doses were decreased mostly by half at the beginning. I think she could go home with less steroids but with a longer taper and not acute taper of more than 10 or 20 down to prevent any significant bounce back in the patient.   The patient has to make an appointment with Dr. Darrick Huntsmanullo for the possibility of keeping her on chronic steroids. She has seen pulmonary in the past, Dr. Welton FlakesKhan, and she will go back to see him.   Overall, the patient was able to be weaned off her high oxygen back to her home oxygen of 2  liters. She had elevated blood sugars due to the steroids for what she had an increase of the dose of her basal to 28 units and 8 units 3 times a day premeal which is new.   2. Tachycardia, likely due to her shortness of breath. She had a CT scan done at [facility] to rule out PE. We were not able to get these results from [place]. The patient is recommend to go and follow up with Dr. Darrick Huntsman for this.  3. Diabetes. The patient has uncontrolled diabetes. Insulin adjusted.  4. Hypertension. Did not have any major changes in her blood pressure here. Her magnesium was replaced. Her white count was due to the steroids.   TIME SPENT: I spent about 35 minutes with this discharge.     ____________________________ Felipa Furnace, MD rsg:gb D: 09/01/2012 18:59:16 ET T: 09/01/2012 21:35:41 ET JOB#: [id number]  cc: Felipa Furnace, MD, <Dictator> Duncan Dull, MD Regan Rakers Juanda Chance MD ELECTRONICALLY SIGNED 09/09/2012 7:38

## 2014-12-31 NOTE — Discharge Summary (Signed)
PATIENT NAME:  Catherine Catherine [name], Catherine Catherine [name] MR#:  [id number] DATE OF BIRTH:  Dec 29, 1955  DATE OF ADMISSION:  03/14/2012 DATE OF DISCHARGE:  03/16/2012  ADMITTING DIAGNOSIS: Shortness of breath, cough.   DISCHARGE DIAGNOSES:  1. Shortness of breath, cough, due to acute on chronic obstructive pulmonary disease exacerbation.  2. Diabetes, poorly controlled due to steroids.  3. Hypertension.  4. Leukocytosis due to steroid treatment.  5. Hyperlipidemia.  6. Remote tobacco use.  7. Gastroesophageal reflux disease.  8. Depression/anxiety.  9. History of chest pain with cardiac catheterization in March 2012 that was negative.  10. History of pulmonary nodule, as per past report PET scan has been negative.  11. Obstructive sleep apnea, unable to tolerate CPAP.  12. Obesity.  13. Status post hysterectomy.  14. Status post hemorrhoidectomy.  15. Status post tonsillectomy.   LABORATORY, DIAGNOSTIC AND RADIOLOGICAL DATA: Admitting WBC count was 12.8, hemoglobin 13.4, platelet count 292. Her EKG showed normal sinus rhythm without any ST-T wave changes. Basic metabolic panel: Glucose 162, BUN 12, creatinine 0.78, sodium 141, potassium 4.1, chloride 107, CO2 26. Chest x-ray showed no acute cardiopulmonary process.   CONSULTANT: Dr. Maryruth EveFleming   HOSPITAL COURSE: Please refer to History and Physical done by the admitting physician. The patient is Catherine [name] 59 year old Caucasian female with chronic obstructive pulmonary disease who has had recurrent admissions to the hospital since 04/28; this would be her third hospitalization for progressive shortness of breath and cough. Again, the patient was seen in the ED with acute chronic obstructive pulmonary disease exacerbation and we were asked to admit the patient. She was treated with nebulizers and steroids. Antibiotics were held due to no evidence of acute bronchitis or chronic obstructive pulmonary disease exacerbation. The patient has recently been on multiple doses of  antibiotics. She was seen in consultation by Dr. Meredeth IdeFleming who agreed with the plan. The patient was very anxious to go home from day one and today she requested that she be discharged. She states that she could the same thing at home. At this time she is stable for discharge.   DISCHARGE MEDICATIONS:  1. She is to continue Protonix 40 daily.  2. Paxil 10 daily.  3. Spiriva 18-mcg inhalation daily.  4. Advair 250/50  INH b.i.d. 5. Glipizide 1 tab p.o. b.i.d.  6. DuoNebs p.r.n. 7. Daliresp 500 mcg daily if able to tolerate. The patient reports that she was not able to tolerate this in the past. 8. NovoLog sliding scale as doing previously.  9. Lantus 20 units subcutaneous at bedtime.  10. Lipitor 40, 2 tabs daily.  11. Aspirin 81 mg, 1 tab p.o. daily.  12. Prednisone taper, 60 mg p.o. daily times 3 days, 40 mg p.o. daily times 3 days, 30 mg p.o. daily times 3 days, 20 mg p.o. daily times 3 days, 10 mg p.o. daily times 3 days.   HOME OXYGEN: Yes. Recommend continuous two-liter oxygen.   DIET: Low sodium ADA diet.   ACTIVITY: As tolerated.   TIMEFRAME FOR FOLLOWUP: 1 to 2 weeks with Dr. Darrick Huntsmanullo.    TIME SPENT:  35 minutes. ____________________________ Lacie ScottsShreyang H. Allena KatzPatel, MD shp:bjt D:  03/17/2012 08:16:13 ET         T: 03/17/2012 14:34:30 ET       JOB#: [id number]  cc: Duncan Dulleresa [name], MD Charise CarwinSHREYANG H Lea Walbert MD ELECTRONICALLY SIGNED 03/19/2012 17:51

## 2014-12-31 NOTE — Discharge Summary (Signed)
PATIENT NAME:  Catherine [name], Catherine [name] MR#:  [id number] DATE OF BIRTH:  10-19-1955  DATE OF ADMISSION:  01/04/2012 DATE OF DISCHARGE:  01/08/2012  ADMITTING DIAGNOSIS: Shortness of breath, cough, palpitations.   DISCHARGE DIAGNOSES:  1. Cough, shortness of breath, palpitations due to acute on chronic obstructive pulmonary disease exacerbation.  2. Acute bronchitis.  3. Palpitations, presyncope due to chronic obstructive pulmonary disease exacerbation. No arrhythmias noted on telemetry.  4. Elevated alkaline phosphatase of unclear etiology. Will need to have repeat LFTs as an outpatient.  5. Diabetes.  6. Hypertension.  7. Leukocytosis, due to steroid treatment.  8. Hyperlipidemia.  9. Remote tobacco use.  10. Gastroesophageal reflux disease.  11. Depression/anxiety.  12. History of chest pain with cardiac catheterization March 2012 that showed no coronary artery disease.  13. History of pulmonary nodules. Reportedly PET scan was negative in the past.  14. Obstructive sleep apnea, not on CPAP.  15. Obesity.  16. Status post hysterectomy.  17. Status post hemorrhoidectomy.  18. Status post tonsillectomy.   PERTINENT LABORATORY DATA AND EVALUATIONS: Glucose 184, BUN 14, creatinine 0.84, sodium 141, potassium 3.5, chloride 106. Carbon dioxide 26, calcium 8.1, total bilirubin 0.5, alkaline phosphatase 153, ALT 25, AST 25, total protein was 7.3, albumin 3.3. WBC count 12.8, hemoglobin 13.1, platelet count 256. Chest x-ray without any acute abnormality. Troponin less than 0.02. EKG showed sinus rhythm with no ST-T wave changes. A repeat CBC on 05/01 showed WBC 11.8, hemoglobin 12.1, platelet count 283. Free T3 levels were normal. TSH level 0.628 and free thyroxine 1.16. Troponin less than 0.02 times three.   HOSPITAL COURSE: Please refer to the History and Physical done by the admitting physician. The patient is a pleasant 59 year old white female with history of chronic obstructive pulmonary  disease with intermittent oxygen use, hypertension, hyperlipidemia, remote tobacco use, diabetes, and obstructive sleep apnea who presented with worsening shortness of breath and coughing. 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  She received breathing treatments with no significant improvement. Due to these symptoms, we were asked to admit the patient. The patient was admitted for acute chronic obstructive pulmonary disease exacerbation, placed on nebulizers, IV steroids, and Levaquin. The patient started to improve slowly. She is currently not 100% back to baseline but is improved and is stable for discharge.   DISCHARGE MEDICATIONS:  1. Lipitor 40, 2 tabs daily.  2. Combivent 1 inhalation q. 4-6 p.r.n.  3. Aspirin 81 mg, 1 tab p.o. daily.  4. Protonix 40 mg daily.  5. Paxil 10 mg daily.  6. Lantus 20 units subcutaneous at bedtime.  7. Spiriva 18 mcg daily.  8. Advair 250/50 INH q.12.  9. Glipizide 5 mg b.i.d.  10. DuoNebs p.r.n.  11. Levaquin 500 p.o. daily times four days.  12. Prednisone 60 mg p.o. daily times three days, 40 mg p.o. daily times three days, 20 mg p.o. daily times three days, 10 mg p.o. daily times three days.  13. Mucinex 600 mg p.o. b.i.d.  14. Robitussin 15 mL q. 6 hours p.r.n.   HOME OXYGEN:  Yes.  Portable tank: Yes. 2 liters continuous.   DIET: Low sodium ADA.   ACTIVITY: As tolerated.   TIMEFRAME FOR FOLLOWUP:   1. Follow up in 1 to 2 weeks with Dr. Darrick Huntsman. 2. Follow up with Dr. Kendrick Fries of pulmonary as scheduled.   TIME SPENT: 35 minutes.   ____________________________ Lacie Scotts Allena Katz,  MD shp:bjt D: 01/08/2012 14:57:55 ET T: 01/09/2012 10:42:32 ET JOB#: [id number]  cc: Yulia Ulrich H. Allena KatzPatel, MD, <Dictator> Duncan Dulleresa [name], MD Charise CarwinSHREYANG H Valari Taylor MD ELECTRONICALLY SIGNED 01/09/2012 15:26

## 2014-12-31 NOTE — H&P (Signed)
PATIENT NAME:  Catherine [name], Catherine [name] MR#:  [id number] DATE OF BIRTH:  05/29/1956  DATE OF ADMISSION:  03/14/2012  REFERRING PHYSICIAN: Dr. Dolores [name] PRIMARY CARE PHYSICIAN:  Dr. Darrick [name] PULMONOLOGIST:  Not yet established with Dr. Kendrick [name] of [place].   PRESENTING COMPLAINT: Increasing shortness of breath and wheezing.   HISTORY OF PRESENT ILLNESS: Catherine [name] is a 59 year old woman with history of chronic obstructive pulmonary disease, hypertension, hyperlipidemia, remote tobacco use, and obstructive sleep apnea who represents with reports of worsening shortness of breath and wheezing. Since her discharge she has not had any significant improvement in her symptoms. She was at work this evening when she was exposed to perfume scents that exacerbated her flare with cough, wheezing, and shortness of breath. She used her oxygen all day yesterday, typically uses O2 as needed and with exertion. The patient was intended to follow up with Dr. Darrick [name] on 07/15, and she has completed her steroid taper since her last discharge on 02/29/2012, and again she is still pending followup with pulmonologist Dr. Kendrick [name]. She reports that Daliresp that she has been taking causes nausea but has not improved her symptoms or better control of symptoms. She has been compliant with her medications. She had an episode of chest pain and spasm in the Emergency Department, which has resolved, but still has some soreness over her right breast. She reports presyncope when she has her chronic obstructive pulmonary disease flare as well as palpitations, but no loss of consciousness. No hemoptysis. No fevers or chills.   PAST MEDICAL HISTORY:  1. This is her fourth admission since October 2012 for chronic obstructive pulmonary disease exacerbation. Her last admission was 06/22 to 02/29/2012.  2. Hypertension.  3. Hyperlipidemia.  4. Chronic obstructive pulmonary disease.  5. Remote tobacco use.  6. Gastroesophageal reflux disease.   7. Depression/anxiety.  8. Diabetes.  9. Cardiac catheterization in 11/2010 showing no coronary artery disease.  10. Pulmonary nodules. PET scan reportedly negative in the past.   11. Obstructive sleep apnea, not on CPAP.  12. Obesity.   PAST SURGICAL HISTORY:  1. Hysterectomy.  2. Hemorrhoidectomy.  3. Tonsillectomy.   ALLERGIES: Aspirin, codeine, ibuprofen, Lorcet, Percocet, and Vicodin.   MEDICATIONS:  1. Advair Diskus 250/50, 1 puff b.i.d.  2. Aspirin 81 mg daily.  3. Combivent inhaler every 4 to 6 hours as needed.  4. Daliresp 500 mcg daily.  5. DuoNebs as needed.  6. Glipizide 5 mg b.i.d.  7. Lantus 20 units at bedtime.  8. Lipitor 80 mg at bedtime.  9. NovoLog sliding scale insulin.  10. Paxil 10 mg daily.  11. Protonix 40 mg daily.  12. Spiriva daily.   FAMILY HISTORY: Heart disease. Mother had bypass in her late 4s. Father, brother, and sister have diabetes.   SOCIAL HISTORY: She lives in [place] with her husband, has two sons. 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 Quit tobacco in October 2012, but smoked since the age of 3. No alcohol or drug use.   REVIEW OF SYSTEMS: CONSTITUTIONAL: No fevers. Endorses nausea. No chills. EYES: No changes in vision. ENT: No epistaxis or discharge. RESPIRATORY: As per history of present illness. CARDIOVASCULAR: As per history of present illness. GI: Endorses nausea. No vomiting, diarrhea, abdominal pain, hematemesis, or melena. GU: No dysuria or hematuria. ENDO: No polyuria or polydipsia. HEMATOLOGIC: No bleeding. SKIN: No ulcers. Reports a pruritic rash on her hands and feet since discharge. No drainage. MUSCULOSKELETAL: No joint pain or swelling. NEUROLOGIC: No  history of strokes or seizures. PSYCH: She endorses depression since the passing of her mother. Maintained on Paxil.   PHYSICAL EXAMINATION:  VITAL SIGNS: Temperature 98.2, pulse 83, respiratory rate 24, blood pressure 135/66, sating at  92% on room air.   GENERAL: Lying in bed in no apparent distress.   HEENT: Normocephalic, atraumatic. Pupils are dilated. Anicteric. She has a breathing mask in place. Moist mucous membranes.   NECK: Soft and supple. No adenopathy or JVP.   CARDIOVASCULAR: Non-tachy. No murmurs, rubs, or gallops.   LUNGS: No wheezing appreciated. She still has some tight airway movement. No use of accessory muscles or increased respiratory effort.   ABDOMEN: Soft. Positive bowel sounds. No mass appreciated.   EXTREMITIES: Trace edema. Dorsal pedis pulses intact.   MUSCULOSKELETAL: No joint effusion.   SKIN: Mild excoriated rash on her hand and feet. No surrounding erythema.   NEUROLOGIC: No dysarthria or aphasia. Symmetrical strength.   PSYCH: She is alert and oriented. The patient is cooperative.   LABORATORY, DIAGNOSTIC AND RADIOLOGICAL DATA: Chest x-ray without any acute findings. Glucose 162, BUN 12, creatinine 0.78, sodium 141, potassium 4.1, chloride 107, carbon dioxide 26, calcium 8.3. WBC 12.8, hemoglobin 13.4, hematocrit 39.9, platelets 292, MCV 88, troponin less than 0.02.   EKG with sinus rate of 85. No ST elevation or depression.   ASSESSMENT AND PLAN: Catherine [name] is a 59 year old woman with history of chronic obstructive pulmonary disease, diabetes, hypertension, hyperlipidemia, obstructive sleep apnea, pulmonary modules, remote tobacco use, readmitted with worsening shortness of breath and wheezing.  1. Chronic obstructive pulmonary disease exacerbation with recurrent admissions for same issues, being the fourth one since October 2012. Chest x-ray without infiltrate. We will hold off on antibiotics. Her CT of the chest from last admission in June 2013 was negative for PE. Reports that Daliresp has not been making a difference so we will hold that for now as it just causes nausea. We will restart her Spiriva and Advair. Continue oxygen, Solu-Medrol, and SVNs. Obtain pulmonary consultation  for recommendations given recurrent admissions. She has yet to follow up with pulmonologist Catherine [name]. Her chest pain is likely musculoskeletal in the setting of her flare and spasm. Catheterization March 2012 was negative for coronary artery disease.  We will get an ABG to evaluate for her CO2 levels. 2. Diabetes. Restart Lantus, sliding scale insulin, and glipizide.  3. Hypertension. Not on medications.  4. Depression. Restart her Paxil.  5. Hyperlipidemia. Restart her Lipitor.  6. Prophylaxis with aspirin, Lovenox, and Protonix.   TIME SPENT: Approximately 50 minutes were spent on patient care.      ____________________________ Reuel DerbyAlounthith Maisyn Nouri, MD ap:bjt D: 03/14/2012 06:32:42 ET T: 03/14/2012 11:20:35 ET JOB#: [id number]  cc: Pearlean BrownieAlounthith Chau Savell, MD, <Dictator> Duncan Dulleresa [name], MD Reuel DerbyALOUNTHITH Elmyra Banwart MD ELECTRONICALLY SIGNED 03/27/2012 0:25

## 2014-12-31 NOTE — H&P (Signed)
PATIENT NAME:  Catherine [name], Catherine [name] MR#:  [id number] DATE OF BIRTH:  22-Feb-1956  DATE OF ADMISSION:  01/04/2012  REFERRING PHYSICIAN: Dr. Dolores Frame.  PRIMARY CARE PHYSICIAN: Dr. Darrick Huntsman. PRIMARY CARDIOLOGIST: Dr. Julien Nordmann. PRIMARY PULMONOLOGIST: Dr. Kendrick Fries, [place].   PRESENTING COMPLAINT: Cough, shortness of breath, palpitations, presyncope.   HISTORY OF PRESENT ILLNESS: Catherine [name] is a pleasant 59 year old woman with history of chronic obstructive pulmonary disease, hypertension, hyperlipidemia, remote tobacco use, diabetes, obstructive sleep apnea, who presents with reports of worsening shortness of breath and difficulty breathing. 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 She reports for the past week at least she has been having increased cough that is nonproductive, shortness of breath and palpitations. She has history of palpitations at baseline, also endorsed some presyncope and dizziness and lightheadedness, but no syncope. She endorses chest pain with the cough. Reports that walking to the bathroom and minimal exertion causes her to be short of breath. The patient does not use oxygen at home.   PAST MEDICAL HISTORY:  1. Admitted in October 2012 for management of chronic obstructive pulmonary disease exacerbation.  2. History of hypertension.  3. Hyperlipidemia.  4. Chronic obstructive pulmonary disease.  5. Remote tobacco use.  6. Gastroesophageal reflux disease.  7. Depression/anxiety.  8. Diabetes.  9. Cardiac catheterization March 2012 showed no coronary artery disease.  10. Pulmonary nodules, reportedly PET scan in the past has been negative.  11. Obstructive sleep apnea, but not on CPAP.  12. Obesity.   PAST SURGICAL HISTORY:  1. Hysterectomy.  2. Hemorrhoidectomy.  3. Tonsillectomy.   ALLERGIES: The patient is  able to tolerate low-dose aspirin but it is listed as an allergy. Allergic to codeine, ibuprofen, Lorcet, Percocet, Vicodin and high dose Tussionex.   MEDICATIONS:  1. Advair Diskus 250/50 b.i.d.  2. Aspirin 81 mg daily.  3. Combivent inhaler every 4 to 6 hours as needed.  4. DuoNeb as needed.  5. Glipizide 5 mg b.i.d.  6. Lantus 20 units subcutaneous at bedtime.  7. Lipitor 40 mg daily.  8. Paxil 10 mg daily.  9. Protonix 40 mg daily.  10. Spiriva 18 mcg capsule inhaled daily.   FAMILY HISTORY: Heart disease. Her mother had bypass in her late 24s. Father, brothers and sisters with diabetes.   SOCIAL HISTORY: She lives in [place] with her husband and two sons. 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 She quit smoking in October 2012, but had been smoking since she was age 55. No alcohol or drug use.   REVIEW OF SYSTEMS: CONSTITUTIONAL: No fevers. She has the beginning of glaucoma. ENT: No epistaxis or discharge. She reports if she does not chew her food thoroughly, she does have some difficulty swallowing. RESPIRATORY: As per history of present illness. CARDIOVASCULAR: As per history of present illness. No syncope. GASTROINTESTINAL: No nausea, vomiting, or diarrhea. She has lower abdominal pain that began yesterday. GU: No dysuria or hematuria. ENDOCRINE: She endorses polyuria and nocturia. HEMATOLOGIC: No bleeding. SKIN: No ulcers. MUSCULOSKELETAL: No joint pain or effusion. NEUROLOGIC: No history of strokes or seizures. PSYCH: Denies any suicidal ideation. She does report depression and anxiety especially since the loss of her mother.   PHYSICAL EXAMINATION:  VITAL SIGNS: Temperature 98.1, pulse 90, respiratory rate 24, blood pressure 137/83, saturating 90% on room air but 96% on O2.  GENERAL: Sitting in bed in no apparent distress.   HEENT: Normocephalic, atraumatic. Pupils equal, symmetric. Breathing mask in place. Moist mucous membrane.   NECK: Soft and supple. No  adenopathy or JVP.   CARDIOVASCULAR: Non-tachy. No murmurs, rubs, or gallops.   LUNGS: Tight airway movement, but no wheezing appreciated. No use of accessory muscles or increased respiratory effort.   ABDOMEN: Soft. She has mild tenderness in the lower abdominal region. No rebound or guarding. No mass appreciated.   EXTREMITIES: There is no edema. Dorsal pedis pulses intact. She has tenderness of her right lower leg on palpation.   MUSCULOSKELETAL: No joint effusion.   SKIN: No ulcers.   NEUROLOGIC: No dysarthria or aphasia. Symmetrical strength.   PSYCH: She is alert and oriented. The patient is cooperative.   PERTINENT LABS AND STUDIES: Glucose 184, BUN 14, creatinine 0.84, sodium 141, potassium 3.5, chloride 106, carbon dioxide 26, calcium 8.1, total bilirubin 0.5, alkaline phosphatase 153, ALT 25, AST 25, total protein 7.3, albumin 3.3. WBC 12.8, hemoglobin 13.1, hematocrit 39.6, platelet 256, MCV 86. Troponin less than 0.02. EKG with sinus rate of 89. No ST elevation or depression. Chest x-ray without any acute infiltrates. Official chest x-ray read is pending.   ASSESSMENT AND PLAN: Catherine [name] is a 59 year old woman with history of chronic obstructive pulmonary disease, remote tobacco use, diabetes, hypertension, hyperlipidemia, who presents with complaints of worsening shortness of breath, cough, presyncope, palpitations, and chest pain.  1. Chronic obstructive pulmonary disease exacerbation, currently sating well on oxygen. She has no evidence of infiltrate on her chest x-ray. Continue to treat for upper respiratory infection with azithromycin. Restart her Spiriva, Advair. Continue oxygen, Solu-Medrol, and SVNs as scheduled. She has a follow-up appointment with Dr. Kendrick FriesMcQuaid. Will not initiate on theophylline unless recommended by him. Chest pain is likely musculoskeletal in the setting of her cough. Her catheterization was negative in March 2013. We will start on Tussionex low dose as  she can tolerate 1/2 teaspoon.  2. Diabetes. Resume her glipizide and Lantus. Start on sliding scale insulin especially since she is on steroids.  3. Depressed TSH level. She was evaluated in March 2013 in the Emergency Department for chronic obstructive pulmonary disease exacerbation. At that time was discharged, but her TSH was obtained and it was slightly depressed. Will go ahead and repeat her TSH and send FT3 and FT4.  4. Elevated alkaline phosphatase, is likely incidental, could be related to her, obesity. We will continue to follow for now.  5. Hypertension, not on medications.  6. Prophylaxis with aspirin, Lovenox and Protonix.   TIME SPENT: Approximately 45 minutes were spent on patient care.   ____________________________ Reuel DerbyAlounthith Shaunte Tuft, MD ap:ap D: 01/04/2012 04:51:18 ET T: 01/04/2012 10:01:40 ET JOB#: [id number]  cc: Pearlean BrownieAlounthith Tiyah Zelenak, MD, <Dictator> Duncan Dulleresa [name], MD Reuel DerbyALOUNTHITH Daviana Haymaker MD ELECTRONICALLY SIGNED 01/07/2012 22:50

## 2014-12-31 NOTE — H&P (Signed)
PATIENT NAME:  ASPEN, LAWRANCE MR#:  [id number] DATE OF BIRTH:  03-Sep-1956  DATE OF ADMISSION:  02/28/2012  REFERRING PHYSICIAN:  Dr. Dolores Frame PRIMARY CARE PHYSICIAN: Dr. Duncan Dull  CHIEF COMPLAINT: Shortness of breath, wheezing.   HISTORY OF PRESENT ILLNESS: This is a pleasant 59 year old female with significant past medical history of chronic obstructive pulmonary disease with recent admission secondary to chronic obstructive pulmonary disease exacerbation in May of this year, history of hypertension, hyperlipidemia, remote tobacco use, diabetes, and obstructive sleep apnea. The patient works as a Scientist, physiological at [employer], where the patient was  working during her night shift when she started to complain of shortness of breath. The patient did have multiple nebulizer treatments without much help so she was admitted to the ED for further management of her shortness of breath. The patient had decreased air entry with significant wheezing where she required continuous inhalation of albuterol and received IV Solu-Medrol. Her symptoms improved after that. The patient is using oxygen only as needed. She reports she has been using it more frequently recently, two liters. The patient has been seen by [place] [facility], Dr. Jenne Campus, where she had pulmonary function tests done on Tuesday. She reports her symptoms have been worse since then. The patient denies any productive sputum.  She reports she has a baseline cough which is with some white sputum, which has not worsened. She denies any fever or chills, chest pain, hemoptysis, diaphoresis, palpitations, or leg edema.  She reports her shortness of breath has been worsening recently to where she requires the use of oxygen upon ambulation.   PAST MEDICAL HISTORY:  1. Admitted recently in May of this year secondary to chronic obstructive pulmonary disease exacerbation.  2. History of hypertension.  3. Hyperlipidemia.   4. Chronic obstructive pulmonary disease.  5. Remote tobacco use.  6. Gastroesophageal reflux disease.  7. Depression/anxiety.  8. Diabetes.  9. Cardiac catheterization March 2012 showing no coronary artery disease.  10. Pulmonary nodules. Reportedly PET scan in the past has been negative.  11. Obstructive sleep apnea, but not on CPAP.  12. Obesity.   PAST SURGICAL HISTORY:  1. Hysterectomy.  2. Hemorrhoidectomy.  3. Tonsillectomy.   ALLERGIES: The patient is able to tolerate low dose aspirin. It is listed as an allergy because she reports if she takes large doses it causes GI bleed. Allergy to codeine, ibuprofen, Lorcet, Percocet, Vicodin, and high dose Tussionex.   MEDICATIONS:  1. Advair Diskus 250/50, 1 puff 2 times a day.  2. Aspirin 81 mg daily.  3. Proventil inhalation every four hours as needed.  4. Daliresp 500 mcg oral daily.  5. DuoNeb inhalation as needed.  6. Glipizide 5 mg oral b.i.d.  7. Lantus 20 units at bedtime.  8. Lipitor 80 mg at bedtime.  9. Paxil 10 mg daily.  10. Protonix 40 mg daily.  11. Spiriva 18-mcg inhalation daily.  12. Phenergan 12.5 as needed at bedtime.   FAMILY HISTORY: Significant for heart disease. Mother had bypass in her late 27s. Father, brother, and sister have diabetes.   SOCIAL HISTORY: Lives in [place] with her husband, has two sons, works as a Diplomatic Services operational officer here at [employer], quit smoking in October 2012, but had been smoking since the age of 78. No alcohol or drug use.   REVIEW OF SYSTEMS: Denies any fever, weakness, or pain. EYES: Denies blurry vision, double vision, or pain. ENT: Denies tinnitus, ear pain, or hearing loss.  RESPIRATORY:  Denies any worsening cough, has baseline cough with occasional white phlegm. CARDIOVASCULAR: Denies any chest pain, orthopnea, edema, arrhythmia, or palpitations. GASTROINTESTINAL: Denies any nausea, vomiting, diarrhea, or abdominal pain. GU: Denies dysuria, hematuria, or renal colic. ENDO:  Denies polyuria, polydipsia, heat or cold intolerance. HEMATOLOGY: Denies anemia, easy bruising, bleeding diathesis, or blood clots. INTEGUMENT: Denies any acne or rash. MUSCULOSKELETAL: Denies any neck pain, back pain, arthritis, swelling, or gout. NEURO: Denies numbness, weakness, dysarthria, epilepsy, or tremors. PSYCH: Denies any insomnia or bipolar disorder. Has history of depression and anxiety.   PHYSICAL EXAMINATION:  VITAL SIGNS: Pulse 92, respiratory rate 22, blood pressure 140/75, saturating 97% on 3 liters nasal cannula.   GENERAL: Well-nourished female, sitting on a chair in no apparent distress.   HEENT: Head atraumatic, normocephalic. Pupils equal, reactive to light. Pink conjunctivae. Anicteric sclerae. Moist oral mucosa.   NECK: Supple. No thyromegaly. No JVD.   CHEST: Has decreased air entry with diffuse wheezing.   CARDIOVASCULAR: S1, S2 heard. No rubs, murmur, or gallops.   ABDOMEN: Soft, nontender, nondistended. Bowel sounds present.   EXTREMITIES: No edema. No clubbing, no cyanosis.   PSYCHIATRIC: Appropriate affect. Awake, alert times three. Intact judgment and insight.   SKIN: No rash.   NEUROLOGIC: Cranial nerves grossly intact. Motor five out of five. Symmetrical sensation.   PERTINENT LABS: Glucose 164, BUN 11, creatinine 0.89, sodium 139, potassium 3.5, chloride 107, CO2 26, troponin less than 0.02. White blood cells 11.9, hemoglobin 12.7, hematocrit 37.2,  platelets 237. Chest x-ray does not show any infiltrate or volume overload.   ASSESSMENT AND PLAN:  1. Chronic obstructive pulmonary disease exacerbation. Currently the patient is saturating well on oxygen. There is no evidence of infiltrate on the chest x-ray and no worsening cough with productive sputum, so no indication for antibiotic at this point. We will continue her home medications Spiriva, Advair, and Daliresp. We will have her on nebulizer treatments, scheduled  and p.r.n. albuterol. We will have  her on IV Solu-Medrol and oxygen p.r.n., and secondary to the white productive phlegm which she reports is hard to bring out, we will have her on Mucinex for that.  2. Diabetes. We will continue her glipizide and Lantus and we will add NovoLog sliding scale given the fact she is on large dose steroids.  3. Gastroesophageal reflux disease. Continue with Protonix.  4. Hyperlipidemia. Continue with metoprolol.  5. Hypertension. Acceptable off medication/ 6. Deep vein thrombosis prophylaxis with subcutaneous heparin and Protonix for GI prophylaxis.  7. CODE STATUS: The patient is FULL CODE.   TOTAL TIME SPENT ON PATIENT CARE, HISTORY AND PHYSICAL: 45 minutes.   ____________________________ Starleen Armsawood S. Vela Render, MD dse:bjt D: 02/28/2012 08:00:00 ET T: 02/28/2012 10:08:07 ET JOB#: [id number]  cc: Starleen Armsawood S. Ricard Faulkner, MD, <Dictator> Duncan Dulleresa [name], MD Dortha Neighbors Teena IraniS Breonia Kirstein MD ELECTRONICALLY SIGNED 03/02/2012 17:22

## 2014-12-31 NOTE — Discharge Summary (Signed)
PATIENT NAME:  Catherine [name], GHOLSON MR#:  [id number] DATE OF BIRTH:  1956-06-06  DATE OF ADMISSION:  02/28/2012 DATE OF DISCHARGE:  02/29/2012  HISTORY AND PHYSICAL: For a detailed note, please take a look at the History and Physical done on admission by Dr. Randol Kern.   DIAGNOSES AT DISCHARGE:  1. Acute on chronic respiratory failure secondary to chronic obstructive pulmonary disease exacerbation. 2. Chronic obstructive pulmonary disease exacerbation.  3. Hypertension.  4. Hyperlipidemia.  5. Anxiety.  6. Diabetes.   DIET: The patient is being discharged on an American Diabetes Association low sodium, low fat diet.   ACTIVITY: As tolerated.   FOLLOWUP: Follow-up with Dr. Darrick Huntsman in the next 1 to 2 weeks.   DISCHARGE MEDICATIONS:  1. Lipitor 80 mg daily.  2. Mucinex b.i.d.  3. Robitussin as needed.  4. Daliresp 500 mcg daily.  5. Combivent 1 puff every 4 to 6 hours as needed.  6. Aspirin 81 mg a day.  7. Protonix 40 mg daily.  8. Paxil 10 mg daily.  9. Lantus 20 units at bedtime.  10. Spiriva 1 puff daily.  11. Advair 250/50, 1 puff b.i.d.  12. Glipizide 5 mg b.i.d.  13. DuoNebs every 6 hours as needed.  14. Prednisone taper starting at 60 mg, down to 10 mg over the next 6 days.  15. NovoLog sliding scale.   PERTINENT HOSPITAL LABORATORY, DIAGNOSTIC AND RADIOLOGICAL DATA:  Chest x-ray done on admission showing no acute cardiopulmonary disease.  A CT scan of the chest done with contrast showing no evidence of pulmonary emboli. No aortic aneurysm or dissection. Multiple nonspecific noncalcified nodular densities appearing stable in both lungs. There is stable lingular atelectasis/fibrosis.   HOSPITAL COURSE: The patient is a 59 year old female with medical problems as mentioned above, presented to the hospital secondary to worsening exertional dyspnea.   1. Acute on chronic respiratory failure: This was likely secondary to chronic obstructive pulmonary disease exacerbation. The  patient was started on IV steroids, around-the-clock nebulizer treatments, and also on her maintenance inhalers, including Advair, Spiriva, and some Daliresp. arrest. Over the next two days the patient's clinical symptoms did somewhat improve. She was already on oxygen at home but was only using it intermittently and was advised to use it more often than needed. She did not have any evidence of pneumonia or bronchitis to exacerbate her chronic obstructive pulmonary disease. She underwent a CT of the chest which showed no evidence of any pulmonary embolism.  2. Chronic obstructive pulmonary disease exacerbation: It was managed with IV steroids, around-the-clock nebulizer treatments, maintenance inhalers. As mentioned, she likely requires using oxygen more frequently than she was. She was told to continue her maintenance inhalers and nebulizer treatments. She was given a prescription for a prednisone taper but no antibiotics presently.  3. Diabetes: Given the fact that she was going to be on steroids, the patient was told to use some sliding scale coverage on top of her Lantus and glipizide to control her sugars. She had no evidence of hypoglycemia while she was in the hospital.  4. Anxiety: The patient was maintained on her Paxil. She will resume that.  5. Hyperlipidemia: The patient was maintained on Lipitor. She will resume that upon discharge, too.  6. Gastroesophageal reflux disease: The patient was maintained on her Protonix, and she will resume that upon discharge.   CODE STATUS: The patient is a FULL CODE.     TIME SPENT ON DISCHARGE: 35 minutes   ____________________________ Rolly Pancake. Sainani,  MD vjs:cbb D: 03/01/2012 08:04:58 ET T: 03/01/2012 10:28:30 ET JOB#: [id number]  cc: Rolly PancakeVivek J. Cherlynn KaiserSainani, MD, <Dictator> Duncan Dulleresa [name], MD Houston SirenVIVEK J SAINANI MD ELECTRONICALLY SIGNED 03/01/2012 15:07

## 2015-01-01 ENCOUNTER — Other Ambulatory Visit: Payer: Self-pay

## 2015-01-01 MED ORDER — AZITHROMYCIN 250 MG PO TABS: 250.0000 mg | ORAL_TABLET | Freq: Every day | ORAL | Status: DC

## 2015-01-03 DIAGNOSIS — M1990 Unspecified osteoarthritis, unspecified site: Secondary | ICD-10-CM | POA: Diagnosis not present

## 2015-01-03 DIAGNOSIS — M542 Cervicalgia: Secondary | ICD-10-CM | POA: Diagnosis not present

## 2015-01-03 DIAGNOSIS — M150 Primary generalized (osteo)arthritis: Secondary | ICD-10-CM | POA: Diagnosis not present

## 2015-01-04 ENCOUNTER — Other Ambulatory Visit: Payer: Self-pay | Admitting: Internal Medicine

## 2015-01-04 NOTE — Telephone Encounter (Signed)
Last refill 1.30.15, last OV 3.14.16.  Please advise refill

## 2015-01-04 NOTE — Telephone Encounter (Signed)
Ok to refill,  Authorized in epic 

## 2015-01-04 NOTE — Telephone Encounter (Signed)
Rx faxed

## 2015-01-10 ENCOUNTER — Other Ambulatory Visit: Payer: Self-pay | Admitting: Pulmonary Disease

## 2015-01-10 ENCOUNTER — Other Ambulatory Visit: Payer: Self-pay

## 2015-01-10 ENCOUNTER — Ambulatory Visit
Admission: RE | Admit: 2015-01-10 | Discharge: 2015-01-10 | Disposition: A | Discharge status: Home / Self Care | Payer: Managed Care, Other (non HMO) | Source: Ambulatory Visit | Attending: Pulmonary Disease | Admitting: Pulmonary Disease

## 2015-01-10 ENCOUNTER — Other Ambulatory Visit: Payer: Self-pay | Admitting: Internal Medicine

## 2015-01-10 DIAGNOSIS — J432 Centrilobular emphysema: Secondary | ICD-10-CM

## 2015-01-10 DIAGNOSIS — Z8639 Personal history of other endocrine, nutritional and metabolic disease: Secondary | ICD-10-CM | POA: Diagnosis not present

## 2015-01-10 DIAGNOSIS — G4733 Obstructive sleep apnea (adult) (pediatric): Secondary | ICD-10-CM

## 2015-01-10 DIAGNOSIS — E119 Type 2 diabetes mellitus without complications: Secondary | ICD-10-CM | POA: Diagnosis not present

## 2015-01-10 DIAGNOSIS — R911 Solitary pulmonary nodule: Secondary | ICD-10-CM

## 2015-01-10 DIAGNOSIS — K760 Fatty (change of) liver, not elsewhere classified: Secondary | ICD-10-CM | POA: Diagnosis not present

## 2015-01-10 DIAGNOSIS — R918 Other nonspecific abnormal finding of lung field: Secondary | ICD-10-CM | POA: Insufficient documentation

## 2015-01-10 DIAGNOSIS — J449 Chronic obstructive pulmonary disease, unspecified: Secondary | ICD-10-CM | POA: Diagnosis not present

## 2015-01-10 HISTORY — DX: Unspecified asthma, uncomplicated: J45.909

## 2015-01-24 DIAGNOSIS — H40013 Open angle with borderline findings, low risk, bilateral: Secondary | ICD-10-CM | POA: Diagnosis not present

## 2015-01-24 LAB — HM DIABETES EYE EXAM

## 2015-01-26 ENCOUNTER — Encounter: Payer: Self-pay | Admitting: *Deleted

## 2015-01-26 ENCOUNTER — Other Ambulatory Visit: Payer: Self-pay

## 2015-01-26 MED ORDER — AZITHROMYCIN 250 MG PO TABS: 250.0000 mg | ORAL_TABLET | Freq: Every day | ORAL | Status: DC

## 2015-01-29 ENCOUNTER — Other Ambulatory Visit: Payer: Self-pay | Admitting: Internal Medicine

## 2015-02-08 ENCOUNTER — Other Ambulatory Visit: Payer: Self-pay | Admitting: Internal Medicine

## 2015-02-09 ENCOUNTER — Other Ambulatory Visit: Payer: Self-pay

## 2015-02-09 MED ORDER — UMECLIDINIUM-VILANTEROL 62.5-25 MCG/INH IN AEPB
1.0000 | INHALATION_SPRAY | Freq: Every day | RESPIRATORY_TRACT | Status: DC
Start: 2015-02-09 — End: 2015-04-16

## 2015-03-05 DIAGNOSIS — M1990 Unspecified osteoarthritis, unspecified site: Secondary | ICD-10-CM | POA: Diagnosis not present

## 2015-03-16 ENCOUNTER — Other Ambulatory Visit: Payer: Self-pay | Admitting: *Deleted

## 2015-03-16 MED ORDER — GLIPIZIDE 5 MG PO TABS: ORAL_TABLET | ORAL | Status: DC

## 2015-03-16 MED ORDER — INSULIN GLARGINE 100 UNIT/ML SOLOSTAR PEN: PEN_INJECTOR | SUBCUTANEOUS | Status: DC

## 2015-03-16 MED ORDER — PAROXETINE HCL 10 MG PO TABS: ORAL_TABLET | ORAL | Status: DC

## 2015-03-16 MED ORDER — INSULIN ASPART 100 UNIT/ML FLEXPEN: PEN_INJECTOR | SUBCUTANEOUS | Status: DC

## 2015-03-16 MED ORDER — ATORVASTATIN CALCIUM 80 MG PO TABS: 80.0000 mg | ORAL_TABLET | Freq: Every day | ORAL | Status: DC

## 2015-03-16 MED ORDER — PANTOPRAZOLE SODIUM 40 MG PO TBEC: 40.0000 mg | DELAYED_RELEASE_TABLET | Freq: Two times a day (BID) | ORAL | Status: DC

## 2015-03-16 MED ORDER — ALBUTEROL SULFATE HFA 108 (90 BASE) MCG/ACT IN AERS: INHALATION_SPRAY | RESPIRATORY_TRACT | Status: DC

## 2015-03-16 MED ORDER — INSULIN PEN NEEDLE 29G X 12.7MM MISC: Status: DC

## 2015-04-16 ENCOUNTER — Other Ambulatory Visit: Payer: Self-pay

## 2015-04-16 MED ORDER — UMECLIDINIUM-VILANTEROL 62.5-25 MCG/INH IN AEPB: 1.0000 | INHALATION_SPRAY | Freq: Every day | RESPIRATORY_TRACT | Status: DC

## 2015-04-20 ENCOUNTER — Encounter: Payer: Self-pay | Admitting: Family Medicine

## 2015-04-20 ENCOUNTER — Ambulatory Visit (INDEPENDENT_AMBULATORY_CARE_PROVIDER_SITE_OTHER): Payer: Managed Care, Other (non HMO) | Admitting: Family Medicine

## 2015-04-20 ENCOUNTER — Ambulatory Visit: Payer: Managed Care, Other (non HMO) | Admitting: Family Medicine

## 2015-04-20 VITALS — BP 142/78 | HR 95 | Temp 98.3°F | Ht 63.0 in | Wt 269.8 lb

## 2015-04-20 DIAGNOSIS — J988 Other specified respiratory disorders: Principal | ICD-10-CM

## 2015-04-20 DIAGNOSIS — B349 Viral infection, unspecified: Secondary | ICD-10-CM | POA: Diagnosis not present

## 2015-04-20 DIAGNOSIS — B9789 Other viral agents as the cause of diseases classified elsewhere: Secondary | ICD-10-CM

## 2015-04-20 MED ORDER — PREDNISONE 50 MG PO TABS: ORAL_TABLET | ORAL | Status: DC

## 2015-04-20 MED ORDER — DOXYCYCLINE HYCLATE 100 MG PO TABS: 100.0000 mg | ORAL_TABLET | Freq: Two times a day (BID) | ORAL | Status: DC

## 2015-04-20 MED ORDER — BENZONATATE 200 MG PO CAPS
200.0000 mg | ORAL_CAPSULE | Freq: Three times a day (TID) | ORAL | Status: DC | PRN
Start: 2015-04-20 — End: 2015-09-20

## 2015-04-20 MED ORDER — HYDROCOD POLST-CPM POLST ER 10-8 MG/5ML PO SUER: 5.0000 mL | Freq: Two times a day (BID) | ORAL | Status: DC | PRN

## 2015-04-20 NOTE — Patient Instructions (Signed)
It was nice to see you today.  Use the antibiotic and prednisone if you worsen.  Use Tessalon as needed.  Follow-up with Dr. Darrick Huntsman as scheduled.  Take care  Dr. Adriana Simas

## 2015-04-20 NOTE — Progress Notes (Signed)
Pre visit review using our clinic review tool, if applicable. No additional management support is needed unless otherwise documented below in the visit note. 

## 2015-04-21 DIAGNOSIS — B9789 Other viral agents as the cause of diseases classified elsewhere: Secondary | ICD-10-CM | POA: Insufficient documentation

## 2015-04-21 DIAGNOSIS — J988 Other specified respiratory disorders: Principal | ICD-10-CM

## 2015-04-21 NOTE — Progress Notes (Signed)
Subjective:  Patient ID: Catherine [name], female    DOB: [birth year]  Age: 59 y.o. MRN: [id number]  CC: Sorethroat/cough  HPI  59 year old female with a complicated past medical history including type 2 diabetes, COPD, chronic respiratory failure, & CAD presents to clinic today for an acute visit with complaints of cough and sore throat.  1) Cough/Sore throat  Patient reports that she has had the above symptoms for approximately 2 days.   She states that her cough is mildly productive.   She has noticed a slight increase in her chronic shortness of breath.  No exacerbating or relieving factors.   She reports recent sick contact (granddaughter).    No fever, chills.  Social Hx - Former smoker.  Review of Systems  Constitutional: Negative for fever and chills.  HENT: Positive for sore throat.   Respiratory: Positive for cough and shortness of breath.     Objective:  BP 142/78 mmHg  Pulse 95  Temp(Src) 98.3 F (36.8 C) (Oral)  Ht  (1.6 m)  Wt 269 lb 12 oz (122.358 kg)  BMI 47.80 kg/m2  SpO2 96%  BP/Weight 04/20/2015 12/27/2014 11/20/2014  Systolic BP 142 144 140  Diastolic BP 78 78 90  Wt. (Lbs) 269.75 264 262.75  BMI 47.8 46.78 46.56   Physical Exam  Constitutional: She is oriented to person, place, and time.  Chronically ill-appearing obese female in no acute distress.  HENT:  Head: Normocephalic and atraumatic.  Mouth/Throat: No oropharyngeal exudate.  Cardiovascular: Normal rate and regular rhythm.   Pulmonary/Chest: Effort normal and breath sounds normal. No respiratory distress. She has no wheezes. She has no rales.  Abdominal: Soft. She exhibits no distension. There is no tenderness.  Musculoskeletal:  Trace LE edema.   Neurological: She is alert and oriented to person, place, and time.  Skin: Skin is warm and dry.  Psychiatric: She has a normal mood and affect.  Vitals reviewed.   Assessment & Plan:   Problem List Items Addressed  This Visit    Viral respiratory illness - Primary    History physical exam consistent with viral respiratory illness. Patient does have an extensive past medical history which includes COPD and chronic respiratory failure. Patient has had recent increasing shortness of breath and productive cough. I advised supportive care at home. Prescriptions for doxycycline and prednisone given in case she fails to improve/develops a COPD exacerbation. Tessalon Perles and Tussionex given for cough.         Meds ordered this encounter  Medications  . dextromethorphan (PX TUSSIN MAX) 15 MG/5ML syrup    Sig: Take by mouth.  . doxycycline (VIBRA-TABS) 100 MG tablet    Sig: Take 1 tablet (100 mg total) by mouth 2 (two) times daily.    Dispense:  20 tablet    Refill:  0  . predniSONE (DELTASONE) 50 MG tablet    Sig: 1 tablet Daily x 5 days.    Dispense:  5 tablet    Refill:  0  . benzonatate (TESSALON) 200 MG capsule    Sig: Take 1 capsule (200 mg total) by mouth 3 (three) times daily as needed for cough.    Dispense:  60 capsule    Refill:  1  . chlorpheniramine-HYDROcodone (TUSSIONEX PENNKINETIC ER) 10-8 MG/5ML SUER    Sig: Take 5 mLs by mouth every 12 (twelve) hours as needed for cough.    Dispense:  140 mL    Refill:  0    Follow-up: PRN  Thersa Salt, DO

## 2015-04-21 NOTE — Assessment & Plan Note (Signed)
History physical exam consistent with viral respiratory illness. Patient does have an extensive past medical history which includes COPD and chronic respiratory failure. Patient has had recent increasing shortness of breath and productive cough. I advised supportive care at home. Prescriptions for doxycycline and prednisone given in case she fails to improve/develops a COPD exacerbation. Tessalon Perles and Tussionex given for cough.

## 2015-07-03 ENCOUNTER — Other Ambulatory Visit: Payer: Self-pay

## 2015-07-03 MED ORDER — ALBUTEROL SULFATE HFA 108 (90 BASE) MCG/ACT IN AERS: INHALATION_SPRAY | RESPIRATORY_TRACT | Status: DC

## 2015-07-03 MED ORDER — IPRATROPIUM-ALBUTEROL 0.5-2.5 (3) MG/3ML IN SOLN: RESPIRATORY_TRACT | Status: DC

## 2015-07-18 ENCOUNTER — Other Ambulatory Visit: Payer: Self-pay

## 2015-07-18 MED ORDER — IPRATROPIUM-ALBUTEROL 0.5-2.5 (3) MG/3ML IN SOLN: RESPIRATORY_TRACT | Status: DC

## 2015-07-23 DIAGNOSIS — H40003 Preglaucoma, unspecified, bilateral: Secondary | ICD-10-CM | POA: Diagnosis not present

## 2015-08-01 DIAGNOSIS — H40013 Open angle with borderline findings, low risk, bilateral: Secondary | ICD-10-CM | POA: Diagnosis not present

## 2015-08-17 ENCOUNTER — Telehealth: Payer: Self-pay | Admitting: *Deleted

## 2015-08-17 NOTE — Telephone Encounter (Signed)
Patient requested a medication refill for ipratropium-albuterol. Please advise

## 2015-08-17 NOTE — Telephone Encounter (Signed)
This medication was refilled last month with refills attached.  I attempted to call the patient, busy signal unable to leave a message.

## 2015-08-20 ENCOUNTER — Other Ambulatory Visit: Payer: Self-pay

## 2015-08-20 MED ORDER — IPRATROPIUM-ALBUTEROL 0.5-2.5 (3) MG/3ML IN SOLN: RESPIRATORY_TRACT | Status: DC

## 2015-08-22 ENCOUNTER — Other Ambulatory Visit: Payer: Self-pay

## 2015-08-22 ENCOUNTER — Telehealth: Payer: Self-pay | Admitting: Internal Medicine

## 2015-08-22 MED ORDER — IPRATROPIUM-ALBUTEROL 0.5-2.5 (3) MG/3ML IN SOLN: RESPIRATORY_TRACT | Status: DC

## 2015-08-22 NOTE — Telephone Encounter (Signed)
completed

## 2015-08-22 NOTE — Telephone Encounter (Signed)
The medication that was called in for the pt was a 30 supply. She needs a 90 day of ipratropium-albuterol (DUONEB) 0.5-2.5 (3) MG/3ML SOLN. Please call pt.  Thank you.

## 2015-08-22 NOTE — Telephone Encounter (Signed)
Pt needs 90 supply for Douneb. Please advise pt/ms

## 2015-09-04 ENCOUNTER — Ambulatory Visit
Admission: EM | Admit: 2015-09-04 | Discharge: 2015-09-04 | Disposition: A | Discharge status: Home / Self Care | Payer: Managed Care, Other (non HMO) | Attending: Family Medicine | Admitting: Family Medicine

## 2015-09-04 ENCOUNTER — Ambulatory Visit (INDEPENDENT_AMBULATORY_CARE_PROVIDER_SITE_OTHER): Payer: Managed Care, Other (non HMO)

## 2015-09-04 ENCOUNTER — Encounter: Payer: Self-pay | Admitting: Emergency Medicine

## 2015-09-04 DIAGNOSIS — J441 Chronic obstructive pulmonary disease with (acute) exacerbation: Secondary | ICD-10-CM

## 2015-09-04 DIAGNOSIS — R0602 Shortness of breath: Secondary | ICD-10-CM

## 2015-09-04 DIAGNOSIS — R05 Cough: Secondary | ICD-10-CM | POA: Diagnosis not present

## 2015-09-04 MED ORDER — LEVOFLOXACIN 500 MG PO TABS: 500.0000 mg | ORAL_TABLET | Freq: Every day | ORAL | Status: DC

## 2015-09-04 MED ORDER — PREDNISONE 10 MG (21) PO TBPK: ORAL_TABLET | ORAL | Status: DC

## 2015-09-04 MED ORDER — METHYLPREDNISOLONE SODIUM SUCC 125 MG IJ SOLR
125.0000 mg | Freq: Once | INTRAMUSCULAR | Status: AC
  Administered 2015-09-04: 125 mg via INTRAMUSCULAR

## 2015-09-04 MED ORDER — IPRATROPIUM-ALBUTEROL 0.5-2.5 (3) MG/3ML IN SOLN
3.0000 mL | Freq: Once | RESPIRATORY_TRACT | Status: AC
Start: 2015-09-04 — End: 2015-09-04
  Administered 2015-09-04: 3 mL via RESPIRATORY_TRACT

## 2015-09-04 MED ORDER — IPRATROPIUM-ALBUTEROL 0.5-2.5 (3) MG/3ML IN SOLN
3.0000 mL | Freq: Once | RESPIRATORY_TRACT | Status: AC
  Administered 2015-09-04: 3 mL via RESPIRATORY_TRACT

## 2015-09-04 MED ORDER — HYDROCOD POLST-CPM POLST ER 10-8 MG/5ML PO SUER: 5.0000 mL | Freq: Two times a day (BID) | ORAL | Status: DC | PRN

## 2015-09-04 NOTE — Discharge Instructions (Signed)
Chronic Obstructive Pulmonary Disease Exacerbation Chronic obstructive pulmonary disease (COPD) is a common lung problem. In COPD, the flow of air from the lungs is limited. COPD exacerbations are times that breathing gets worse and you need extra treatment. Without treatment they can be life threatening. If they happen often, your lungs can become more damaged. If your COPD gets worse, your doctor may treat you with:  Medicines.  Oxygen.  Different ways to clear your airway, such as using a mask. HOME CARE  Do not smoke.  Avoid tobacco smoke and other things that bother your lungs.  If given, take your antibiotic medicine as told. Finish the medicine even if you start to feel better.  Only take medicines as told by your doctor.  Drink enough fluids to keep your pee (urine) clear or pale yellow (unless your doctor has told you not to).  Use a cool mist machine (vaporizer).  If you use oxygen or a machine that turns liquid medicine into a mist (nebulizer), continue to use them as told.  Keep up with shots (vaccinations) as told by your doctor.  Exercise regularly.  Eat healthy foods.  Keep all doctor visits as told. GET HELP RIGHT AWAY IF:  You are very short of breath and it gets worse.  You have trouble talking.  You have bad chest pain.  You have blood in your spit (sputum).  You have a fever.  You keep throwing up (vomiting).  You feel weak, or you pass out (faint).  You feel confused.  You keep getting worse. MAKE SURE YOU:  Understand these instructions.  Will watch your condition.  Will get help right away if you are not doing well or get worse.   This information is not intended to replace advice given to you by your health care provider. Make sure you discuss any questions you have with your health care provider.   Document Released: 08/14/2011 Document Revised: 09/15/2014 Document Reviewed: 04/29/2013 Elsevier Interactive Patient Education 2016  Else[employer]  Shortness of Breath Shortness of breath means you have trouble breathing. Shortness of breath needs medical care right away. HOME CARE   Do not smoke.  Avoid being around chemicals or things (paint fumes, dust) that may bother your breathing.  Rest as needed. Slowly begin your normal activities.  Only take medicines as told by your doctor.  Keep all doctor visits as told. GET HELP RIGHT AWAY IF:   Your shortness of breath gets worse.  You feel lightheaded, pass out (faint), or have a cough that is not helped by medicine.  You cough up blood.  You have pain with breathing.  You have pain in your chest, arms, shoulders, or belly (abdomen).  You have a fever.  You cannot walk up stairs or exercise the way you normally do.  You do not get better in the time expected.  You have a hard time doing normal activities even with rest.  You have problems with your medicines.  You have any new symptoms. MAKE SURE YOU:  Understand these instructions.  Will watch your condition.  Will get help right away if you are not doing well or get worse.   This information is not intended to replace advice given to you by your health care provider. Make sure you discuss any questions you have with your health care provider.   Document Released: 02/11/2008 Document Revised: 08/30/2013 Document Reviewed: 11/10/2011 Elsevier Interactive Patient Education [employer]

## 2015-09-04 NOTE — ED Provider Notes (Signed)
CSN: [id number]     Arrival date & time 09/04/15  1529 History   First MD Initiated Contact with Patient 09/04/15 1821    Nurses notes were reviewed. Chief Complaint  Patient presents with  . Fever  . Cough   Patient is here because of shortness of breath started on Christmas Day. She is on oxygen at home for COPD. She's been on oxygen now for about 4 years. Which is about the same time she stop smoking. She has a diagnosis of COPD and the last bad exacerbation was about 3 years ago requiring trip to the hospital. She states she's coughing up yellowish phlegm as well. Occasionally fever and markedly short of breath to his up to 4 L now.  (Consider location/radiation/quality/duration/timing/severity/associated sxs/prior Treatment) Patient is a 59 y.o. female presenting with fever. The history is provided by the patient. No language interpreter was used.  Fever Temp source:  Subjective Severity:  Mild Timing:  Intermittent Progression:  Partially resolved Relieved by:  Nothing Ineffective treatments:  None tried Associated symptoms: cough   Associated symptoms: no chest pain   Cough:    Cough characteristics:  Productive   Sputum characteristics:  Yellow   Severity:  Moderate   Timing:  Constant   Progression:  Unable to specify   Chronicity:  Recurrent Risk factors: no hx of cancer     Past Medical History  Diagnosis Date  . Diabetes mellitus   . Leukocytosis   . Hyperlipidemia   . COPD (chronic obstructive pulmonary disease) (HCC)   . Hyperglycemia   . Hypertension   . S/P cardiac catheterization March 2012    normal coronaries,  due to chest pain ([place])  . Tobacco abuse, in remission     quit oct during hospitalization   . Screening for colon cancer June 2011    polyps found, next one due 2014 ([place])  . Gastritis and duodenitis June 2011    EGD  . Screening for breast cancer Jul 28 2011    normal  . GERD (gastroesophageal reflux disease)   . Obesity (BMI  30-39.9)   . Depression   . Anxiety   . Asthma    Past Surgical History  Procedure Laterality Date  . Cardiac catheterization  11/22/2010    no significant disease  . Abdominal hysterectomy    . Hemorrhoid surgery    . Abdominal hysterectomy    . Bilateral oophorectomy  1995    and TAH.  noncancerous reasons   Family History  Problem Relation Age of Onset  . Coronary artery disease Mother 37  . Heart disease Mother     CABG x5  . COPD Mother 43  . Breast cancer Mother   . Kidney disease Mother   . Heart failure Father   . Angina Father   . Diabetes Father   . Depression Father   . Asthma Brother   . Asthma Brother    Social History  Substance Use Topics  . Smoking status: Former Smoker -- 1.00 packs/day for 40 years    Types: Cigarettes    Quit date: 06/27/2011  . Smokeless tobacco: Never Used  . Alcohol Use: No   OB History    No data available     Review of Systems  Constitutional: Positive for fever.  Respiratory: Positive for cough, shortness of breath and wheezing.   Cardiovascular: Negative for chest pain.  All other systems reviewed and are negative.   Allergies  Ibuprofen; Oxycodone-acetaminophen; Percocet; Sulfa antibiotics;  Vicodin; and Aspirin  Home Medications   Prior to Admission medications   Medication Sig Start Date End Date Taking? Authorizing Provider  chlorpheniramine-HYDROcodone (TUSSIONEX PENNKINETIC ER) 10-8 MG/5ML SUER Take 5 mLs by mouth every 12 (twelve) hours as needed for cough. 04/20/15  Yes Jayce G Cook, DO  ipratropium-albuterol (DUONEB) 0.5-2.5 (3) MG/3ML SOLN USE 1 VIAL VIA NEBULIZER EVERY 6 HOURS AS NEEDED 08/22/15  Yes Sherlene Shams, MD  ACCU-CHEK SMARTVIEW test strip TEST BLOOD SUGAR FOUR TIMES DAILY 11/04/12   Sherlene Shams, MD  albuterol (PROAIR HFA) 108 (90 BASE) MCG/ACT inhaler INHALE 2 PUFFS INTO THE LUNGS EVERY 6 HOURS AS NEEDED FOR WHEEZING 07/03/15   Sherlene Shams, MD  ALPRAZolam Prudy Feeler) 0.25 MG tablet TAKE 1  TABLET BY MOUTH EVERY NIGHT AT BEDTIME AS NEEDED 10/17/14   Sherlene Shams, MD  aspirin 81 MG EC tablet Take 81 mg by mouth daily.      Historical Provider, MD  atorvastatin (LIPITOR) 80 MG tablet Take 1 tablet (80 mg total) by mouth daily. 03/16/15   Sherlene Shams, MD  azithromycin (ZITHROMAX) 250 MG tablet Take 1 tablet (250 mg total) by mouth daily. 01/26/15   Lupita Leash, MD  benzonatate (TESSALON) 200 MG capsule Take 1 capsule (200 mg total) by mouth 3 (three) times daily as needed for cough. 04/20/15   Tommie Sams, DO  butalbital-acetaminophen-caffeine (FIORICET, ESGIC) 50-325-40 MG per tablet TAKE 1 TABLET BY MOUTH EVERY 4 HOURS AS NEEDED FOR HEADACHE 01/04/15   Sherlene Shams, MD  chlorpheniramine-HYDROcodone [facility] PENNKINETIC ER) 10-8 MG/5ML LQCR Take 5 mLs by mouth every 12 (twelve) hours as needed for cough. 12/08/14   Sherlene Shams, MD  chlorpheniramine-HYDROcodone (TUSSIONEX PENNKINETIC ER) 10-8 MG/5ML SUER Take 5 mLs by mouth every 12 (twelve) hours as needed for cough. 09/04/15   Hassan Rowan, MD  dextromethorphan (PX TUSSIN MAX) 15 MG/5ML syrup Take by mouth.    Historical Provider, MD  docusate sodium (COLACE) 100 MG capsule Take 200 mg by mouth daily.    Historical Provider, MD  doxycycline (DORYX) 100 MG DR capsule Take 100 mg by mouth 2 (two) times daily.    Historical Provider, MD  doxycycline (VIBRA-TABS) 100 MG tablet Take 1 tablet (100 mg total) by mouth 2 (two) times daily. 04/20/15   Tommie Sams, DO  furosemide (LASIX) 40 MG tablet TAKE 1 TABLET BY MOUTH EVERY DAY AS NEEDED FOR LEG SWELLING AND TO MAINTAIN WEIGHT. 11/14/13   Sherlene Shams, MD  glipiZIDE (GLUCOTROL) 5 MG tablet TAKE 1 TABLET BY MOUTH BEFORE BREAKFAST AND 2 TABLET BY MOUTH BEFORE DINNER 03/16/15   Sherlene Shams, MD  hydroxychloroquine (PLAQUENIL) 200 MG tablet Take 1 tablet by mouth daily. 11/19/14   Historical Provider, MD  insulin aspart (NOVOLOG FLEXPEN) 100 UNIT/ML FlexPen INJECT 10 UNITS UNDER THE SKIN  THREE TIMES DAILY BEFORE MEALS 03/16/15   Sherlene Shams, MD  Insulin Glargine (LANTUS SOLOSTAR) 100 UNIT/ML Solostar Pen INJECT 30 UNITS INTO THE SKIN DAILY AT 10 PM 03/16/15   Sherlene Shams, MD  insulin glargine (LANTUS) 100 UNIT/ML injection Inject 0.25 mLs (25 Units total) into the skin at bedtime. 09/26/14   Sherlene Shams, MD  Insulin Pen Needle 29G X 12.7MM MISC Use as directed 03/16/15   Sherlene Shams, MD  Insulin Syringe-Needle U-100 (INSULIN SYRINGE .5CC/31GX5/16") 31G X 5/16" 0.5 ML MISC Test twice a day 09/15/12   Sherlene Shams, MD  latanoprost (XALATAN) 0.005 % ophthalmic solution Place 1 drop into both eyes at bedtime.    Historical Provider, MD  levofloxacin (LEVAQUIN) 500 MG tablet Take 1 tablet (500 mg total) by mouth daily. 09/04/15   Hassan RowanEugene Balthazar Dooly, MD  pantoprazole (PROTONIX) 40 MG tablet Take 1 tablet (40 mg total) by mouth 2 (two) times daily. 03/16/15   Sherlene Shamseresa L [name], MD  PARoxetine (PAXIL) 10 MG tablet TAKE 1 TABLET(10 MG) BY MOUTH EVERY MORNING 03/16/15   Sherlene Shamseresa L [name], MD  Potassium 99 MG TABS Take 99 mg by mouth once.    Historical Provider, MD  Potassium Gluconate 595 MG CAPS Take 2 capsules by mouth daily.     Historical Provider, MD  predniSONE (DELTASONE) 50 MG tablet 1 tablet Daily x 5 days. 04/20/15   Tommie SamsJayce G Cook, DO  predniSONE (STERAPRED UNI-PAK 21 TAB) 10 MG (21) TBPK tablet Sig 6 tablet day 1, 5 tablets day 2, 4 tablets day 3,,3tablets day 4, 2 tablets day 5, 1 tablet day 6 take all tablets orally 09/04/15   Hassan RowanEugene Cristine Daw, MD  Umeclidinium-Vilanterol [facility](ANORO ELLIPTA) 62.5-25 MCG/INH AEPB Inhale 1 puff into the lungs daily. 04/16/15   Lupita Leashouglas B McQuaid, MD   Meds Ordered and Administered this Visit   Medications  ipratropium-albuterol (DUONEB) 0.5-2.5 (3) MG/3ML nebulizer solution 3 mL (3 mLs Nebulization Given 09/04/15 1654)  methylPREDNISolone sodium succinate (SOLU-MEDROL) 125 mg/2 mL injection 125 mg (125 mg Intramuscular Given 09/04/15 1835)  ipratropium-albuterol  (DUONEB) 0.5-2.5 (3) MG/3ML nebulizer solution 3 mL (3 mLs Nebulization Given 09/04/15 1834)    BP 133/83 mmHg  Pulse 85  Temp(Src) 98.3 F (36.8 C) (Oral)  Resp 22  SpO2 87% No data found.   Physical Exam  Constitutional: She appears well-developed and well-nourished.  HENT:  Head: Normocephalic and atraumatic.  Eyes: Pupils are equal, round, and reactive to light.  Neck: Normal range of motion. Neck supple.  Cardiovascular: Regular rhythm.  Exam reveals distant heart sounds.   Pulmonary/Chest: Effort normal. No respiratory distress. She has no decreased breath sounds. She has no wheezes.  Patient had scattered wheezes occasionally otherwise  decreased breath sounds throughout the lung field  Vitals reviewed.   ED Course  Procedures (including critical care time)  Labs Review Labs Reviewed - No data to display  Imaging Review Dg Chest 2 View  09/04/2015  CLINICAL DATA:  Cough, shortness of Breath since 09/02/2015 EXAM: CHEST  2 VIEW COMPARISON:  01/03/2013.  Chest CT 01/10/2015. FINDINGS: Lingular scarring, stable. Right lung is clear. Heart is normal size. No effusions. No acute bony abnormality. IMPRESSION: No active cardiopulmonary disease. Electronically Signed   By: Charlett NoseKevin  Dover M.D.   On: 09/04/2015 16:59     Visual Acuity Review  Right Eye Distance:   Left Eye Distance:   Bilateral Distance:    Right Eye Near:   Left Eye Near:    Bilateral Near:         MDM   1. COPD with acute exacerbation (HCC)   2. Shortness of breath    Patient was informed chest x-ray was negative for acute disease or active disease. We'll place on prednisone six-day dose pack, Tussionex for cough per her request, Levaquin 500 mg 1 tablet daily for 10 days. Follow-up with PCP next 3-5 days if not improving but also go to the ED of her choice if symptoms worsen in the next 2448 hrs. Will give another breathing treatment will be a second DuoNeb here and given a  shot of Solu-Medrol  125 IM appears well. She is a diabetic and she says she will increase or changes in dosages need to to cover herself for the Solu-Medrol and prednisone.     Hassan Rowan, MD 09/04/15 [phone]

## 2015-09-04 NOTE — ED Notes (Signed)
Pt sick since 12/25, fever , cough, sore throat, sweats, coughing up phlegm yellow, SOB. Wears O2 on 4L. PMH COPD, diabetes.

## 2015-09-12 ENCOUNTER — Telehealth: Payer: Self-pay | Admitting: Internal Medicine

## 2015-09-12 NOTE — Telephone Encounter (Signed)
Disability form placed in physicians box.

## 2015-09-17 ENCOUNTER — Other Ambulatory Visit: Payer: Self-pay | Admitting: Internal Medicine

## 2015-09-17 ENCOUNTER — Telehealth: Payer: Self-pay | Admitting: Internal Medicine

## 2015-09-17 NOTE — Telephone Encounter (Signed)
Patient has requested physician completion of a form that requires the ENTIRE chart to be copied,  By January 12Th.  This is not likely possible,  i have done my part.  Returning to you in red folder

## 2015-09-17 NOTE — Telephone Encounter (Signed)
Form placed up front in envelope to send off to [employer] to complete medical records request.

## 2015-09-19 ENCOUNTER — Other Ambulatory Visit: Payer: Self-pay | Admitting: Internal Medicine

## 2015-09-20 ENCOUNTER — Ambulatory Visit: Payer: Managed Care, Other (non HMO) | Admitting: Family Medicine

## 2015-09-20 ENCOUNTER — Ambulatory Visit (INDEPENDENT_AMBULATORY_CARE_PROVIDER_SITE_OTHER): Payer: Managed Care, Other (non HMO) | Admitting: Family Medicine

## 2015-09-20 ENCOUNTER — Telehealth: Payer: Self-pay

## 2015-09-20 ENCOUNTER — Encounter: Payer: Self-pay | Admitting: Family Medicine

## 2015-09-20 VITALS — BP 122/76 | HR 89 | Temp 98.1°F | Resp 22

## 2015-09-20 DIAGNOSIS — J441 Chronic obstructive pulmonary disease with (acute) exacerbation: Secondary | ICD-10-CM | POA: Diagnosis not present

## 2015-09-20 MED ORDER — HYDROCOD POLST-CPM POLST ER 10-8 MG/5ML PO SUER: 5.0000 mL | Freq: Two times a day (BID) | ORAL | Status: DC | PRN

## 2015-09-20 MED ORDER — PREDNISONE 10 MG PO TABS: ORAL_TABLET | ORAL | Status: DC

## 2015-09-20 MED ORDER — DOXYCYCLINE HYCLATE 100 MG PO TABS: 100.0000 mg | ORAL_TABLET | Freq: Two times a day (BID) | ORAL | Status: DC

## 2015-09-20 MED ORDER — INSULIN DETEMIR 100 UNIT/ML ~~LOC~~ SOLN: 30.0000 [IU] | Freq: Every day | SUBCUTANEOUS | Status: DC

## 2015-09-20 NOTE — Telephone Encounter (Signed)
levemir  rx printed for one month only,  Too many pharmacies to choose from in chart

## 2015-09-20 NOTE — Patient Instructions (Signed)
Follow up closely with Pulmonology.  Take care  Dr. Adriana Simasook

## 2015-09-20 NOTE — Telephone Encounter (Signed)
She needs it sent to [facility] per the visit note.

## 2015-09-20 NOTE — Telephone Encounter (Signed)
She has also not seen me for diabetes follow up in over a year so  I will refill with Levemir.  She will need to make an appt ASAP and have fasting labs done prior to visit

## 2015-09-20 NOTE — Telephone Encounter (Signed)
Patient came in for Acute visit with Dr. Adriana Simasook.  Patient requested a refill on Lantus, but insurance will not cover lantus, needs to be changed to Levemir.  [facility].  Please advise?

## 2015-09-20 NOTE — Progress Notes (Signed)
Subjective:  Patient ID: Catherine [name], female    DOB: [birth year]  Age: 60 y.o. MRN: [id number]  CC: Cough, SOB  HPI:  60 year old female with an extensive PMH including CAD, DM-2, severe COPD presents with the above complaints.   Patient was recently seen on 12/27 at urgent care. Office visit summarized as follows: Patient presented with cough and SOB consistent with COPD exacerbation. She was treated with Prednisone, Levaquin, and Tussionex.  She presents today with reports of continued cough and SOB. She reports short improvement following above treatment but quick recurrence of productive cough and SOB. She reports associated fatigue. No exacerbating factors. She has continued on her home meds without improvement. No associated fever, chills.   Social Hx   Social History   Social History  . Marital Status: Married    Spouse Name: N/A  . Number of Children: 4  . Years of Education: N/A   Occupational History  . Secretary    Social History Main Topics  . Smoking status: Former Smoker -- 1.00 packs/day for 40 years    Types: Cigarettes    Quit date: 06/27/2011  . Smokeless tobacco: Never Used  . Alcohol Use: No  . Drug Use: No  . Sexual Activity: Not Asked   Other Topics Concern  . None   Social History Narrative   Review of Systems  Constitutional: Negative for fever.  Respiratory: Positive for cough and shortness of breath.    Objective:  BP 122/76 mmHg  Pulse 89  Temp(Src) 98.1 F (36.7 C) (Oral)  Resp 22  SpO2 98%  BP/Weight 09/20/2015 09/04/2015 04/20/2015  Systolic BP 122 133 142  Diastolic BP 76 83 78  Wt. (Lbs) - - 269.75  BMI - - 47.8   Physical Exam  Constitutional:  Morbidly obese female, wearing [place] O2. No apparent distress.   HENT:  Mouth/Throat: Oropharynx is clear and moist.  Eyes: Conjunctivae are normal.  Cardiovascular: Normal rate and regular rhythm.   Pulmonary/Chest: Effort normal.  Scattered wheezing.   Neurological: She is  alert.  Psychiatric: She has a normal mood and affect.  Vitals reviewed.   Lab Results  Component Value Date   WBC 9.5 10/07/2013   HGB 14.5 10/07/2013   HCT 42.8 10/07/2013   PLT 319 10/07/2013   GLUCOSE 144* 09/25/2014   CHOL 175 09/25/2014   TRIG 164.0* 09/25/2014   HDL 37.00* 09/25/2014   LDLDIRECT 105.5 12/22/2012   LDLCALC 105* 09/25/2014   ALT 27 12/27/2014   AST 34 12/27/2014   NA 140 09/25/2014   K 4.2 09/25/2014   CL 103 09/25/2014   CREATININE 0.70 09/25/2014   BUN 10 09/25/2014   CO2 32 09/25/2014   TSH 0.628 01/04/2012   INR 0.9 07/22/2012   HGBA1C 8.4* 09/25/2014   MICROALBUR 1.3 09/25/2014    Assessment & Plan:   Problem List Items Addressed This Visit    COPD exacerbation (HCC) - Primary    COPD with acute exacerbation. Recent Urgent care visit reviewed and summarized in HPI. Also reviewed most recent pulm note. In summary: patient continues to have several exacerbations indicating poor prognosis. At that time he started her on prophylaxis with azithromycin (she has since stopped). Pulse ox noted today. Not hypoxic on O2. Treating with Doxy, Prednisone taper, and Tussionex. Advised close follow up with Pulm.      Relevant Medications   predniSONE (DELTASONE) 10 MG tablet   chlorpheniramine-HYDROcodone (TUSSIONEX PENNKINETIC ER) 10-8 MG/5ML SUER  Meds ordered this encounter  Medications  . doxycycline (VIBRA-TABS) 100 MG tablet    Sig: Take 1 tablet (100 mg total) by mouth 2 (two) times daily.    Dispense:  20 tablet    Refill:  0  . predniSONE (DELTASONE) 10 MG tablet    Sig: 50 mg (5 tablets) daily x 3 days, then 40 mg (4 tablets) daily x 3 days, then 30 mg (3 tablets) daily x 3 days, then 20 mg (2 tablets) daily x 3 days, then 10 mg (1 tablet) daily x 3 days.    Dispense:  45 tablet    Refill:  0  . chlorpheniramine-HYDROcodone (TUSSIONEX PENNKINETIC ER) 10-8 MG/5ML SUER    Sig: Take 5 mLs by mouth every 12 (twelve) hours as needed.     Dispense:  115 mL    Refill:  0    Follow-up: PRN  Everlene OtherJayce Alvan Culpepper DO [facility]

## 2015-09-20 NOTE — Addendum Note (Signed)
Addended by: Sherlene ShamsULLO, Rashod Gougeon L on: 09/20/2015 05:01 PM   Modules accepted: Orders, Medications

## 2015-09-20 NOTE — Assessment & Plan Note (Addendum)
COPD with acute exacerbation. Recent Urgent care visit reviewed and summarized in HPI. Also reviewed most recent pulm note. In summary: patient continues to have several exacerbations indicating poor prognosis. At that time he started her on prophylaxis with azithromycin (she has since stopped). Pulse ox noted today. Not hypoxic on O2. Treating with Doxy, Prednisone taper, and Tussionex. Advised close follow up with Pulm.

## 2015-09-20 NOTE — Telephone Encounter (Signed)
Does she mean Dr [facility]ered[facility] IdeFleming at [facility]? Or acturla pulmonologist at [facility]

## 2015-09-21 ENCOUNTER — Encounter: Payer: Self-pay | Admitting: Internal Medicine

## 2015-09-21 NOTE — Telephone Encounter (Signed)
Script faxed as requested.

## 2015-09-25 ENCOUNTER — Telehealth: Payer: Self-pay

## 2015-09-25 MED ORDER — INSULIN DETEMIR 100 UNIT/ML FLEXPEN: 30.0000 [IU] | PEN_INJECTOR | Freq: Every day | SUBCUTANEOUS | Status: DC

## 2015-09-25 NOTE — Telephone Encounter (Signed)
FLEX PEN RX SENT

## 2015-09-25 NOTE — Telephone Encounter (Signed)
Pharmacy is requesting Levemir 100 units/ml  wants to know if the pt can have the pen form over the vial form, since the pt has a flex touch pen. Please advise/tvw

## 2015-09-25 NOTE — Addendum Note (Signed)
Addended by: Sherlene Shams on: 09/25/2015 05:41 PM   Modules accepted: Orders

## 2015-10-30 NOTE — Telephone Encounter (Signed)
Pt called back to check the status of her Disability form from [facility]. It was brought in on 09/12/2015. [employer] states they did not receive it. Call pt @ [phone]. Thank you!

## 2015-10-30 NOTE — Telephone Encounter (Signed)
notified pt she can call [employer] and that her paperwork was faxed on Jan. 9th, 2017 number [phone].

## 2015-10-30 NOTE — Telephone Encounter (Signed)
Do you know about this paperwork? Please advise, thanks

## 2015-10-30 NOTE — Telephone Encounter (Signed)
I see where Erie Noe stated she put in physician box but I have not seen this form . Note not forwarded to me.

## 2015-11-04 ENCOUNTER — Other Ambulatory Visit: Payer: Self-pay | Admitting: Internal Medicine

## 2015-11-05 NOTE — Telephone Encounter (Signed)
No OV with MD since 3/16 ok to fill?

## 2015-11-05 NOTE — Telephone Encounter (Signed)
Scheduled patient appointment and faxed script to pharmacy.

## 2015-11-05 NOTE — Telephone Encounter (Signed)
OFFICE VISIT NEEDED.  REFILL GIVEN

## 2015-11-19 ENCOUNTER — Ambulatory Visit (INDEPENDENT_AMBULATORY_CARE_PROVIDER_SITE_OTHER): Payer: Managed Care, Other (non HMO) | Admitting: Internal Medicine

## 2015-11-19 ENCOUNTER — Encounter: Payer: Self-pay | Admitting: Internal Medicine

## 2015-11-19 VITALS — BP 140/82 | HR 94 | Temp 97.8°F | Resp 12 | Ht 63.0 in | Wt 269.8 lb

## 2015-11-19 DIAGNOSIS — E785 Hyperlipidemia, unspecified: Secondary | ICD-10-CM | POA: Diagnosis not present

## 2015-11-19 DIAGNOSIS — J441 Chronic obstructive pulmonary disease with (acute) exacerbation: Secondary | ICD-10-CM

## 2015-11-19 DIAGNOSIS — R5383 Other fatigue: Secondary | ICD-10-CM

## 2015-11-19 DIAGNOSIS — Z794 Long term (current) use of insulin: Secondary | ICD-10-CM | POA: Diagnosis not present

## 2015-11-19 DIAGNOSIS — E1100 Type 2 diabetes mellitus with hyperosmolarity without nonketotic hyperglycemic-hyperosmolar coma (NKHHC): Secondary | ICD-10-CM | POA: Diagnosis not present

## 2015-11-19 DIAGNOSIS — E669 Obesity, unspecified: Secondary | ICD-10-CM

## 2015-11-19 DIAGNOSIS — E118 Type 2 diabetes mellitus with unspecified complications: Secondary | ICD-10-CM

## 2015-11-19 DIAGNOSIS — IMO0002 Reserved for concepts with insufficient information to code with codable children: Secondary | ICD-10-CM

## 2015-11-19 DIAGNOSIS — E1165 Type 2 diabetes mellitus with hyperglycemia: Secondary | ICD-10-CM

## 2015-11-19 DIAGNOSIS — K219 Gastro-esophageal reflux disease without esophagitis: Secondary | ICD-10-CM

## 2015-11-19 MED ORDER — INSULIN DETEMIR 100 UNIT/ML FLEXPEN: 50.0000 [IU] | PEN_INJECTOR | Freq: Every day | SUBCUTANEOUS | Status: DC

## 2015-11-19 MED ORDER — INSULIN ASPART 100 UNIT/ML FLEXPEN: PEN_INJECTOR | SUBCUTANEOUS | Status: DC

## 2015-11-19 MED ORDER — FAMOTIDINE 20 MG PO TABS: 20.0000 mg | ORAL_TABLET | Freq: Two times a day (BID) | ORAL | Status: DC

## 2015-11-19 MED ORDER — IPRATROPIUM-ALBUTEROL 0.5-2.5 (3) MG/3ML IN SOLN: RESPIRATORY_TRACT | Status: DC

## 2015-11-19 NOTE — Patient Instructions (Addendum)
I AM TREATING YOU WITH A PREDNISONE TAPER SINCE  YOU HAVE STARTED TO HAVE SYMPTOMS  CALL FOR AN  ANTIBIOTIC IF YOU DEVELOP FEVER (T > 100),  GREEN SPUTUM , FACIAL PAIN , EAR PAIN   Given the number of times you have been treated with antibiotics in the last 6 months consider taking a probiotic ( Align, Flora[place] or Culturelle), the generic version of one of these over the counter medications, or an alternative form (kombucha,  Yogurt, or another dietary source) for a minimum of 3 weeks to prevent a serious antibiotic associated diarrhea  Called clostridium dificile colitis.  Taking a probiotic may also prevent vaginitis due to yeast infections and can be continued indefinitely if you feel that it improves your digestion or your elimination (bowels).     STOP USING THE GLIPIZIDE SINCE  YOU ARE USING LEVEMIR AND NOVOLOG   The risks of long term PPI use for acid suppression in patients without documented Barretts esophagus INCLUDE  possibility of osteoporosis, iron , B12 and magnesium deficiencies,  KIDNEY DISEASE  and dementia. i SUGGEST TRYING FAMOTIDINE  20 mg daily and if GERD symptoms return,  To resume daily PROTONIX .   i WILL REFILL YOUR CHOLESTEROL MEDICATION ONCE I SEE YOUR LABS FROM TODAY   RETURN IN 3 MONTHS

## 2015-11-19 NOTE — Progress Notes (Signed)
Subjective:  Patient ID: Catherine [name], female    DOB: 01-21-1956  Age: 60 y.o. MRN: [id number]  CC: The primary encounter diagnosis was Other fatigue. Diagnoses of Uncontrolled type 2 diabetes mellitus with hyperosmolarity without coma, with long-term current use of insulin (HCC), Hyperlipidemia, Uncontrolled type 2 diabetes mellitus with complication, with long-term current use of insulin (HCC), COPD exacerbation (HCC), Gastroesophageal reflux disease without esophagitis, and Obesity (BMI 30-39.9) were also pertinent to this visit.  HPI Catherine [name] presents for diabetes follow up.  She was last seen for DM follow up March 2016.  She has brought a log of her sugars.  She has had recurrent treatments for  COPD exacerbations averaging every other month .  She does not exercise due to end stage lung disease.  Lab Results  Component Value Date   HGBA1C 8.5* 11/19/2015   Taking 30 levemir once daily ,  8 to 10 units pre meal of Novolog,  Less if eating a dinner salad. BS readings 170 to 200 2 hr post prandial dinner.  fastings usually < 130  Had a low BS one night after taking glipizide and novolog first .  Last diabetic eye exam was 3-4 months ago  Normal  2) COPD:  She has been using  DUONEB EVERY 3 TO 4 HOURS VIA NEB   RHINITIS AND SCRATCHY THROAT STARTED YESTERDAY  LAST TREATMENT FOR COPD  WAS IN [place]AN WITH COOK  [facility]     Outpatient Prescriptions Prior to Visit  Medication Sig Dispense Refill  . ACCU-CHEK SMARTVIEW test strip TEST BLOOD SUGAR FOUR TIMES DAILY 100 each 12  . ALPRAZolam (XANAX) 0.25 MG tablet TAKE 1 TABLET BY MOUTH EVERY NIGHT AT BEDTIME AS NEEDED 30 tablet 0  . aspirin 81 MG EC tablet Take 81 mg by mouth daily.      Marland Kitchen. atorvastatin (LIPITOR) 80 MG tablet Take 1 tablet (80 mg total) by mouth daily. 90 tablet 1  . butalbital-acetaminophen-caffeine (FIORICET, ESGIC) 50-325-40 MG per tablet TAKE 1 TABLET BY MOUTH EVERY 4  HOURS AS NEEDED FOR HEADACHE 30 tablet 3  . chlorpheniramine-HYDROcodone (TUSSIONEX PENNKINETIC ER) 10-8 MG/5ML SUER Take 5 mLs by mouth every 12 (twelve) hours as needed. 115 mL 0  . docusate sodium (COLACE) 100 MG capsule Take 200 mg by mouth daily. Reported on 09/20/2015    . furosemide (LASIX) 40 MG tablet TAKE 1 TABLET BY MOUTH EVERY DAY AS NEEDED FOR LEG SWELLING AND TO MAINTAIN WEIGHT. 30 tablet 5  . glipiZIDE (GLUCOTROL) 5 MG tablet TAKE 1 TABLET BEFORE       BREAKFAST AND TAKE 2       TABLETS BEFORE DINNER 270 tablet 1  . Insulin Pen Needle 29G X 12.7MM MISC Use as directed 200 each 1  . Insulin Syringe-Needle U-100 (INSULIN SYRINGE .5CC/31GX5/16") 31G X 5/16" 0.5 ML MISC Test twice a day 100 each 11  . latanoprost (XALATAN) 0.005 % ophthalmic solution Place 1 drop into both eyes at bedtime.    Marland Kitchen. PARoxetine (PAXIL) 10 MG tablet TAKE 1 TABLET(10 MG) BY MOUTH EVERY MORNING 90 tablet 0  . Potassium 99 MG TABS Take 99 mg by mouth once. Reported on 09/20/2015    . PROAIR HFA 108 (90 Base) MCG/ACT inhaler USE 2 INHALATIONS ORALLY   EVERY 6 HOURS AS NEEDED FORWHEEZING 17 g 1  . Umeclidinium-Vilanterol (ANORO ELLIPTA) 62.5-25 MCG/INH AEPB Inhale 1 puff into the lungs daily. 90 each 2  .  azithromycin (ZITHROMAX) 250 MG tablet Take 1 tablet (250 mg total) by mouth daily. 30 tablet 0  . doxycycline (VIBRA-TABS) 100 MG tablet Take 1 tablet (100 mg total) by mouth 2 (two) times daily. 20 tablet 0  . doxycycline (VIBRA-TABS) 100 MG tablet Take 1 tablet (100 mg total) by mouth 2 (two) times daily. 20 tablet 0  . Insulin Detemir (LEVEMIR) 100 UNIT/ML Pen Inject 30 Units into the skin daily at 10 pm. 15 mL 11  . ipratropium-albuterol (DUONEB) 0.5-2.5 (3) MG/3ML SOLN USE 1 VIAL VIA NEBULIZER EVERY 6 HOURS AS NEEDED 360 mL 6  . NOVOLOG FLEXPEN 100 UNIT/ML FlexPen INJECT 10 UNITS            SUBCUTANEOUSLY 3 TIMES A   DAY BEFORE MEALS 30 mL 0  . pantoprazole (PROTONIX) 40 MG tablet Take 1 tablet (40 mg total) by  mouth 2 (two) times daily. 180 tablet 1  . Potassium Gluconate 595 MG CAPS Take 2 capsules by mouth daily.     . predniSONE (DELTASONE) 10 MG tablet 50 mg (5 tablets) daily x 3 days, then 40 mg (4 tablets) daily x 3 days, then 30 mg (3 tablets) daily x 3 days, then 20 mg (2 tablets) daily x 3 days, then 10 mg (1 tablet) daily x 3 days. 45 tablet 0  . dextromethorphan (PX TUSSIN MAX) 15 MG/5ML syrup Take by mouth. Reported on 11/19/2015    . hydroxychloroquine (PLAQUENIL) 200 MG tablet Take 1 tablet by mouth daily. Reported on 11/19/2015     No facility-administered medications prior to visit.    Review of Systems;  Patient denies headache, fevers, malaise, unintentional weight loss, skin rash, eye pain, sinus congestion and sinus pain, sore throat, dysphagia,  hemoptysis , cough, dyspnea, wheezing, chest pain, palpitations, orthopnea, edema, abdominal pain, nausea, melena, diarrhea, constipation, flank pain, dysuria, hematuria, urinary  Frequency, nocturia, numbness, tingling, seizures,  Focal weakness, Loss of consciousness,  Tremor, insomnia, depression, anxiety, and suicidal ideation.      Objective:  BP 140/82 mmHg  Pulse 94  Temp(Src) 97.8 F (36.6 C) (Oral)  Resp 12  Ht 5\' 3"  (1.6 m)  Wt 269 lb 12 oz (122.358 kg)  BMI 47.80 kg/m2  SpO2 95%  BP Readings from Last 3 Encounters:  11/19/15 140/82  09/20/15 122/76  09/04/15 133/83    Wt Readings from Last 3 Encounters:  11/19/15 269 lb 12 oz (122.358 kg)  04/20/15 269 lb 12 oz (122.358 kg)  12/27/14 264 lb (119.75 kg)    General appearance: alert, cooperative and appears stated age Ears: normal TM's and external ear canals both ears Throat: lips, mucosa, and tongue normal; teeth and gums normal Neck: no adenopathy, no carotid bruit, supple, symmetrical, trachea midline and thyroid not enlarged, symmetric, no tenderness/mass/nodules Back: symmetric, no curvature. ROM normal. No CVA tenderness. Lungs: clear to auscultation  bilaterally Heart: regular rate and rhythm, S1, S2 normal, no murmur, click, rub or gallop Abdomen: soft, non-tender; bowel sounds normal; no masses,  no organomegaly Pulses: 2+ and symmetric Skin: Skin color, texture, turgor normal. No rashes or lesions Lymph nodes: Cervical, supraclavicular, and axillary nodes normal.  Lab Results  Component Value Date   HGBA1C 8.5* 11/19/2015   HGBA1C 8.4* 09/25/2014   HGBA1C 8.6* 10/07/2013    Lab Results  Component Value Date   CREATININE 0.64 11/19/2015   CREATININE 0.70 09/25/2014   CREATININE 0.66 10/07/2013    Lab Results  Component Value Date   WBC 10.1  11/19/2015   HGB 13.2 11/19/2015   HCT 39.8 11/19/2015   PLT 307.0 11/19/2015   GLUCOSE 160* 11/19/2015   CHOL 233* 11/19/2015   TRIG 317.0* 11/19/2015   HDL 37.90* 11/19/2015   LDLDIRECT 154.0 11/19/2015   LDLCALC 105* 09/25/2014   ALT 20 11/19/2015   AST 27 11/19/2015   NA 139 11/19/2015   K 3.9 11/19/2015   CL 103 11/19/2015   CREATININE 0.64 11/19/2015   BUN 10 11/19/2015   CO2 27 11/19/2015   TSH 0.628 01/04/2012   INR 0.9 07/22/2012   HGBA1C 8.5* 11/19/2015   MICROALBUR 1.0 11/21/2015    Dg Chest 2 View  09/04/2015  CLINICAL DATA:  Cough, shortness of Breath since 09/02/2015 EXAM: CHEST  2 VIEW COMPARISON:  01/03/2013.  Chest CT 01/10/2015. FINDINGS: Lingular scarring, stable. Right lung is clear. Heart is normal size. No effusions. No acute bony abnormality. IMPRESSION: No active cardiopulmonary disease. Electronically Signed   By: Charlett Nose M.D.   On: 09/04/2015 16:59    Assessment & Plan:   Problem List Items Addressed This Visit    Hyperlipidemia    She is on maximal dose atorvastatin for known CAD but has run out due to loss to follwup.  Resume atorvastatin  Lab Results  Component Value Date   CHOL 233* 11/19/2015   HDL 37.90* 11/19/2015   LDLCALC 105* 09/25/2014   LDLDIRECT 154.0 11/19/2015   TRIG 317.0* 11/19/2015   CHOLHDL 6 11/19/2015   Lab  Results  Component Value Date   ALT 20 11/19/2015   AST 27 11/19/2015   ALKPHOS 136* 11/19/2015   BILITOT 0.3 11/19/2015           Relevant Orders   LDL cholesterol, direct (Completed)   GERD (gastroesophageal reflux disease)     We discussed my concern about continuing your PPI (Prilosec) in light of the recently published studies suggesting an association with increased risk of dementia and kidney failure.  I advised her to try using  famotidine 20 mg  twice daily, and I sent  an rx to  pharmacy.    if  reflux symptoms are controlled,  she can Continue the daily h2 blocker. If not,  We will resume a covered PPI        Relevant Medications   famotidine (PEPCID) 20 MG tablet   Obesity (BMI 30-39.9)    I have addressed  BMI and recommended a low glycemic index diet utilizing smaller more frequent meals to increase metabolism.  I have also recommended that patient start exercising with a goal of 30 minutes of walkin a minimum of 5 days per week.       Relevant Medications   Insulin Detemir (LEVEMIR) 100 UNIT/ML Pen   insulin aspart (NOVOLOG FLEXPEN) 100 UNIT/ML FlexPen   Diabetes mellitus type 2, uncontrolled, with complications (HCC)    Uncontrolled due to frequent use of prednisone and lack of access to new pharmacotherapy.  Has been lost to follow up since March 2016.  Last a1c 8.6, now 8.5  Uses SSI Novolog ,  Glipizide at dinner and Levemir,  Blood sugars reportedly 130  Or lower  fasting and > 180 post prandial.  Will increase Levemir to 40 units daily,  Stop glipizide, and increase pre dinner Novolog to starting level of 12 units  Lab Results  Component Value Date   HGBA1C 8.5* 11/19/2015            Relevant Medications   Insulin Detemir (LEVEMIR)  100 UNIT/ML Pen   insulin aspart (NOVOLOG FLEXPEN) 100 UNIT/ML FlexPen   COPD exacerbation (HCC)    Early,  Symptoms started yesterday/today.  Prednisone doxycycline      Relevant Medications   ipratropium-albuterol  (DUONEB) 0.5-2.5 (3) MG/3ML SOLN    Other Visit Diagnoses    Other fatigue    -  Primary    Relevant Orders    CBC with Differential/Platelet (Completed)    Uncontrolled type 2 diabetes mellitus with hyperosmolarity without coma, with long-term current use of insulin (HCC)        Relevant Medications    Insulin Detemir (LEVEMIR) 100 UNIT/ML Pen    insulin aspart (NOVOLOG FLEXPEN) 100 UNIT/ML FlexPen    Other Relevant Orders    Comprehensive metabolic panel (Completed)    Hemoglobin A1c (Completed)    Lipid panel (Completed)    Microalbumin / creatinine urine ratio     A total of 40 minutes was spent with patient more than half of which was spent in counseling patient on the above mentioned issues , reviewing and explaining recent labs and imaging studies done, and coordination of care.   I have discontinued Catherine [name]'s Potassium Gluconate, azithromycin, pantoprazole, doxycycline, doxycycline, and predniSONE. I have changed her NOVOLOG FLEXPEN to insulin aspart. I have also changed her Insulin Detemir. Additionally, I am having her start on famotidine. Lastly, I am having her maintain her aspirin, docusate sodium, INSULIN SYRINGE .5CC/31GX5/16", ACCU-CHEK SMARTVIEW, furosemide, latanoprost, ALPRAZolam, hydroxychloroquine, butalbital-acetaminophen-caffeine, atorvastatin, PARoxetine, Insulin Pen Needle, umeclidinium-vilanterol, Potassium, dextromethorphan, PROAIR HFA, glipiZIDE, chlorpheniramine-HYDROcodone, CINNAMON PO, Magnesium, and ipratropium-albuterol.  Meds ordered this encounter  Medications  . CINNAMON PO    Sig: Take 1,000 mg by mouth 2 (two) times daily.  . Magnesium 250 MG TABS    Sig: Take 250 mg by mouth daily.  . Insulin Detemir (LEVEMIR) 100 UNIT/ML Pen    Sig: Inject 50 Units into the skin daily at 10 pm.    Dispense:  45 mL    Refill:  3    90 day supply.  PATIENTS USES  50 UNIT DAILY  . ipratropium-albuterol (DUONEB) 0.5-2.5 (3) MG/3ML SOLN    Sig: USE 1 VIAL VIA  NEBULIZER EVERY 6 HOURS AS NEEDED    Dispense:  1080 mL    Refill:  3    90 DAY SUPPLY.  USES 4 DAILY  . insulin aspart (NOVOLOG FLEXPEN) 100 UNIT/ML FlexPen    Sig: INJECT 15 UNITS            SUBCUTANEOUSLY 3 TIMES A   DAY BEFORE MEALS    Dispense:  45 mL    Refill:  0    90 DAY SUPPLY,   USES 45 UNITS DAILY  . famotidine (PEPCID) 20 MG tablet    Sig: Take 1 tablet (20 mg total) by mouth 2 (two) times daily.    Dispense:  180 tablet    Refill:  0    Medications Discontinued During This Encounter  Medication Reason  . azithromycin (ZITHROMAX) 250 MG tablet Error  . doxycycline (VIBRA-TABS) 100 MG tablet Completed Course  . doxycycline (VIBRA-TABS) 100 MG tablet Error  . Potassium Gluconate 595 MG CAPS Error  . predniSONE (DELTASONE) 10 MG tablet Completed Course  . Insulin Detemir (LEVEMIR) 100 UNIT/ML Pen Reorder  . ipratropium-albuterol (DUONEB) 0.5-2.5 (3) MG/3ML SOLN Reorder  . NOVOLOG FLEXPEN 100 UNIT/ML FlexPen Reorder  . pantoprazole (PROTONIX) 40 MG tablet     Follow-up: No Follow-up on file.   Ceazia Harb,  Aris Everts, MD

## 2015-11-19 NOTE — Progress Notes (Signed)
Pre-visit discussion using our clinic review tool. No additional management support is needed unless otherwise documented below in the visit note.  

## 2015-11-20 LAB — COMPREHENSIVE METABOLIC PANEL
ALT: 20 U/L (ref 0–35)
AST: 27 U/L (ref 0–37)
Albumin: 3.7 g/dL (ref 3.5–5.2)
Alkaline Phosphatase: 136 U/L — ABNORMAL HIGH (ref 39–117)
BUN: 10 mg/dL (ref 6–23)
CALCIUM: 8.8 mg/dL (ref 8.4–10.5)
CHLORIDE: 103 meq/L (ref 96–112)
CO2: 27 meq/L (ref 19–32)
Creatinine, Ser: 0.64 mg/dL (ref 0.40–1.20)
GFR: 100.17 mL/min (ref >60.00)
GLUCOSE: 160 mg/dL — AB (ref 70–99)
Potassium: 3.9 mEq/L (ref 3.5–5.1)
Sodium: 139 mEq/L (ref 135–145)
Total Bilirubin: 0.3 mg/dL (ref 0.2–1.2)
Total Protein: 7 g/dL (ref 6.0–8.3)

## 2015-11-20 LAB — CBC WITH DIFFERENTIAL/PLATELET
Basophils Absolute: 0.1 10*3/uL (ref 0.0–0.1)
Basophils Relative: 1.4 % (ref 0.0–3.0)
EOS ABS: 0.4 10*3/uL (ref 0.0–0.7)
Eosinophils Relative: 4.3 % (ref 0.0–5.0)
HCT: 39.8 % (ref 36.0–46.0)
HEMOGLOBIN: 13.2 g/dL (ref 12.0–15.0)
LYMPHS ABS: 3.3 10*3/uL (ref 0.7–4.0)
Lymphocytes Relative: 32.7 % (ref 12.0–46.0)
MCHC: 33.1 g/dL (ref 30.0–36.0)
MCV: 81.7 fl (ref 78.0–100.0)
MONO ABS: 0.5 10*3/uL (ref 0.1–1.0)
Monocytes Relative: 5.3 % (ref 3.0–12.0)
NEUTROS PCT: 56.3 % (ref 43.0–77.0)
Neutro Abs: 5.7 10*3/uL (ref 1.4–7.7)
Platelets: 307 10*3/uL (ref 150.0–400.0)
RBC: 4.87 Mil/uL (ref 3.87–5.11)
RDW: 14.2 % (ref 11.5–15.5)
WBC: 10.1 10*3/uL (ref 4.0–10.5)

## 2015-11-20 LAB — LIPID PANEL
CHOL/HDL RATIO: 6
CHOLESTEROL: 233 mg/dL — AB (ref 0–200)
HDL: 37.9 mg/dL — AB (ref >39.00)
NonHDL: 195.22
Triglycerides: 317 mg/dL — ABNORMAL HIGH (ref 0.0–149.0)
VLDL: 63.4 mg/dL — AB (ref 0.0–40.0)

## 2015-11-20 LAB — LDL CHOLESTEROL, DIRECT: LDL DIRECT: 154 mg/dL

## 2015-11-20 LAB — HEMOGLOBIN A1C: Hgb A1c MFr Bld: 8.5 % — ABNORMAL HIGH (ref 4.6–6.5)

## 2015-11-21 ENCOUNTER — Other Ambulatory Visit: Payer: Managed Care, Other (non HMO)

## 2015-11-21 ENCOUNTER — Other Ambulatory Visit: Payer: Self-pay | Admitting: Internal Medicine

## 2015-11-21 ENCOUNTER — Encounter: Payer: Self-pay | Admitting: Internal Medicine

## 2015-11-21 DIAGNOSIS — Z794 Long term (current) use of insulin: Secondary | ICD-10-CM | POA: Diagnosis not present

## 2015-11-21 DIAGNOSIS — E1100 Type 2 diabetes mellitus with hyperosmolarity without nonketotic hyperglycemic-hyperosmolar coma (NKHHC): Secondary | ICD-10-CM

## 2015-11-21 LAB — MICROALBUMIN / CREATININE URINE RATIO
CREATININE, U: 163.7 mg/dL
Microalb Creat Ratio: 0.6 mg/g (ref 0.0–30.0)
Microalb, Ur: 1 mg/dL (ref 0.0–1.9)

## 2015-11-21 MED ORDER — ATORVASTATIN CALCIUM 80 MG PO TABS: 80.0000 mg | ORAL_TABLET | Freq: Every day | ORAL | Status: DC

## 2015-11-21 NOTE — Assessment & Plan Note (Signed)
She is on maximal dose atorvastatin for known CAD but has run out due to loss to follwup.  Resume atorvastatin  Lab Results  Component Value Date   CHOL 233* 11/19/2015   HDL 37.90* 11/19/2015   LDLCALC 105* 09/25/2014   LDLDIRECT 154.0 11/19/2015   TRIG 317.0* 11/19/2015   CHOLHDL 6 11/19/2015   Lab Results  Component Value Date   ALT 20 11/19/2015   AST 27 11/19/2015   ALKPHOS 136* 11/19/2015   BILITOT 0.3 11/19/2015

## 2015-11-21 NOTE — Assessment & Plan Note (Signed)
  We discussed my concern about continuing your PPI (Prilosec) in light of the recently published studies suggesting an association with increased risk of dementia and kidney failure.  I advised her to try using  famotidine 20 mg  twice daily, and I sent  an rx to  pharmacy.    if  reflux symptoms are controlled,  she can Continue the daily h2 blocker. If not,  We will resume a covered PPI

## 2015-11-21 NOTE — Assessment & Plan Note (Signed)
Uncontrolled due to frequent use of prednisone and lack of access to new pharmacotherapy.  Has been lost to follow up since March 2016.  Last a1c 8.6, now 8.5  Uses SSI Novolog ,  Glipizide at dinner and Levemir,  Blood sugars reportedly 130  Or lower  fasting and > 180 post prandial.  Will increase Levemir to 40 units daily,  Stop glipizide, and increase pre dinner Novolog to starting level of 12 units  Lab Results  Component Value Date   HGBA1C 8.5* 11/19/2015

## 2015-11-21 NOTE — Assessment & Plan Note (Signed)
I have addressed  BMI and recommended a low glycemic index diet utilizing smaller more frequent meals to increase metabolism.  I have also recommended that patient start exercising with a goal of 30 minutes of walkin a minimum of 5 days per week.

## 2015-11-21 NOTE — Assessment & Plan Note (Signed)
Early,  Symptoms started yesterday/today.  Prednisone doxycycline

## 2015-12-06 ENCOUNTER — Ambulatory Visit (INDEPENDENT_AMBULATORY_CARE_PROVIDER_SITE_OTHER): Payer: Managed Care, Other (non HMO) | Admitting: Pulmonary Disease

## 2015-12-06 ENCOUNTER — Encounter: Payer: Self-pay | Admitting: Pulmonary Disease

## 2015-12-06 VITALS — BP 148/84 | HR 100 | Ht 63.0 in | Wt 267.0 lb

## 2015-12-06 DIAGNOSIS — F17201 Nicotine dependence, unspecified, in remission: Secondary | ICD-10-CM | POA: Diagnosis not present

## 2015-12-06 DIAGNOSIS — R911 Solitary pulmonary nodule: Secondary | ICD-10-CM | POA: Diagnosis not present

## 2015-12-06 DIAGNOSIS — J432 Centrilobular emphysema: Secondary | ICD-10-CM | POA: Diagnosis not present

## 2015-12-06 NOTE — Assessment & Plan Note (Signed)
Consider lung cancer screening. She quit smoking in 2013.

## 2015-12-06 NOTE — Assessment & Plan Note (Signed)
These were stable in 2016 compared to the images in 2013. No further imaging necessary at this time.

## 2015-12-06 NOTE — Assessment & Plan Note (Signed)
[name] had has severe COPD based on her lung function testing from 2013 and her prior heavy smoking. She has a phenotype characterized by repeated exacerbations. This was successfully managed with daily azithromycin but because of noncompliance she started having exacerbations again in the winter unsurprisingly.  She says that her shortness of breath is a bit worse than when we saw her the last time and she is on 4 L of oxygen now. On exam her lungs are clear and her chest x-ray which I personally reviewed from December 2016 did not show anything other than emphysema.  I don't think this is anything other than worsening COPD, but it's reasonable to get a pulmonary function test to ensure there is nothing else that's going on.  Obesity and deconditioning contributes significantly to her symptoms, I strongly recommend that she participate in pulmonary rehabilitation but she is reluctant.  Plan: Repeat pulmonary function tests to look for evidence of some other or worsening pulmonary disease Continue oxygen as prescribed Pulmonary rehabilitation referral Consider restarting daily azithromycin, this could only be done if she will commit to quarterly visits for liver function testing and EKG monitoring Follow-up in  as this is more convenient for her and perhaps she'll be able to keep her appointments there  Greater than 50% of time was spent face-to-face in a 27 minute visit

## 2015-12-06 NOTE — Progress Notes (Signed)
Subjective:    Patient ID: Catherine [name], female    DOB: 11-12-1955, 60 y.o.   MRN: [id number]  HPI   Synopsis: Catherine [name] is a 60 year old female with history of COPD, HTN, GERD, pulmonary nodule, OSA, and allergic rhinitis.  She had a severe course of hospitalizations in 2013.    Chief Complaint  Patient presents with  . Follow-up    pt c/o sob with exertion, mostly nonprod cough with clear mucus.  CAT score 33.   [name] has been doing OK. She hasn't been hospitalized since the last visit. She gets short fo breath with any activity. Coughs every day, rarely produces mucus.  No leg swelling since the last visit. Just has some finger swelling.  She saw Catherine [name] and was told that she had a form of rheumatoid arthritis.   Still taking Anoro every day.  Quit smoking 2014? She doesn't exercise, she watches her granddaughter all day. She had bronchitis in December, on Christmas day.  Went to the doctor the Wednesday after Christmas and ended up on antibiotics.  Past Medical History  Diagnosis Date  . Diabetes mellitus   . Leukocytosis   . Hyperlipidemia   . COPD (chronic obstructive pulmonary disease) (HCC)   . Hyperglycemia   . Hypertension   . S/P cardiac catheterization March 2012    normal coronaries,  due to chest pain ([place])  . Tobacco abuse, in remission     quit oct during hospitalization   . Screening for colon cancer June 2011    polyps found, next one due 2014 ([place])  . Gastritis and duodenitis June 2011    EGD  . Screening for breast cancer Jul 28 2011    normal  . GERD (gastroesophageal reflux disease)   . Obesity (BMI 30-39.9)   . Depression   . Anxiety   . Asthma      Review of Systems  Constitutional: Positive for fatigue. Negative for fever, chills, diaphoresis and appetite change.  HENT: Positive for sore throat. Negative for congestion, postnasal drip, rhinorrhea, sinus pressure and sneezing.   Respiratory: Positive for cough and  shortness of breath. Negative for choking, chest tightness and wheezing.   Cardiovascular: Negative for chest pain and leg swelling.    Objective:   Physical Exam Filed Vitals:   12/06/15 1041  BP: 148/84  Pulse: 100  Height:  (1.6 m)  Weight: 267 lb (121.11 kg)  SpO2: 96%  4L [place]  Gen: obese, no acute distress HENT: OP clear, TM's clear, neck supple PULM: CTA B, normal percussion CV: RRR, no mgr, trace edema GI: BS+, soft, nontender Derm: no cyanosis or rash Psyche: normal mood and affect  Office records from her primary care physician were reviewed were she was treated with bronchitis for bronchitis Chest x-ray images from December 2016 personally reviewed, see summary below      Assessment & Plan:   COPD (chronic obstructive pulmonary disease) (HCC) Catherine [name] had has severe COPD based on her lung function testing from 2013 and her prior heavy smoking. She has a phenotype characterized by repeated exacerbations. This was successfully managed with daily azithromycin but because of noncompliance she started having exacerbations again in the winter unsurprisingly.  She says that her shortness of breath is a bit worse than when we saw her the last time and she is on 4 L of oxygen now. On exam her lungs are clear and her chest x-ray which I personally reviewed from December 2016 did  not show anything other than emphysema.  I don't think this is anything other than worsening COPD, but it's reasonable to get a pulmonary function test to ensure there is nothing else that's going on.  Obesity and deconditioning contributes significantly to her symptoms, I strongly recommend that she participate in pulmonary rehabilitation but she is reluctant.  Plan: Repeat pulmonary function tests to look for evidence of some other or worsening pulmonary disease Continue oxygen as prescribed Pulmonary rehabilitation referral Consider restarting daily azithromycin, this could only be done if  she will commit to quarterly visits for liver function testing and EKG monitoring Follow-up in [place] as this is more convenient for her and perhaps she'll be able to keep her appointments there  Greater than 50% of time was spent face-to-face in a 27 minute visit  Pulmonary nodule These were stable in 2016 compared to the images in 2013. No further imaging necessary at this time.  Tobacco abuse, in remission Consider lung cancer screening. She quit smoking in 2013.    Updated Medication List Outpatient Encounter Prescriptions as of 12/06/2015  Medication Sig  . ACCU-CHEK SMARTVIEW test strip TEST BLOOD SUGAR FOUR TIMES DAILY  . ALPRAZolam (XANAX) 0.25 MG tablet TAKE 1 TABLET BY MOUTH EVERY NIGHT AT BEDTIME AS NEEDED  . aspirin 81 MG EC tablet Take 81 mg by mouth daily.    Marland Kitchen atorvastatin (LIPITOR) 80 MG tablet Take 1 tablet (80 mg total) by mouth daily.  . butalbital-acetaminophen-caffeine (FIORICET, ESGIC) 50-325-40 MG per tablet TAKE 1 TABLET BY MOUTH EVERY 4 HOURS AS NEEDED FOR HEADACHE  . chlorpheniramine-HYDROcodone (TUSSIONEX PENNKINETIC ER) 10-8 MG/5ML SUER Take 5 mLs by mouth every 12 (twelve) hours as needed.  Marland Kitchen CINNAMON PO Take 1,000 mg by mouth 2 (two) times daily.  Marland Kitchen docusate sodium (COLACE) 100 MG capsule Take 200 mg by mouth daily. Reported on 09/20/2015  . furosemide (LASIX) 40 MG tablet TAKE 1 TABLET BY MOUTH EVERY DAY AS NEEDED FOR LEG SWELLING AND TO MAINTAIN WEIGHT.  Marland Kitchen insulin aspart (NOVOLOG FLEXPEN) 100 UNIT/ML FlexPen INJECT 15 UNITS            SUBCUTANEOUSLY 3 TIMES A   DAY BEFORE MEALS  . Insulin Detemir (LEVEMIR) 100 UNIT/ML Pen Inject 50 Units into the skin daily at 10 pm.  . Insulin Pen Needle 29G X 12.7MM MISC Use as directed  . Insulin Syringe-Needle U-100 (INSULIN SYRINGE .5CC/31GX5/16") 31G X 5/16" 0.5 ML MISC Test twice a day  . ipratropium-albuterol (DUONEB) 0.5-2.5 (3) MG/3ML SOLN USE 1 VIAL VIA NEBULIZER EVERY 6 HOURS AS NEEDED  . latanoprost  (XALATAN) 0.005 % ophthalmic solution Place 1 drop into both eyes at bedtime.  . Magnesium 250 MG TABS Take 250 mg by mouth daily.  Marland Kitchen PARoxetine (PAXIL) 10 MG tablet TAKE 1 TABLET(10 MG) BY MOUTH EVERY MORNING  . Potassium 99 MG TABS Take 99 mg by mouth once. Reported on 09/20/2015  . PROAIR HFA 108 (90 Base) MCG/ACT inhaler USE 2 INHALATIONS ORALLY   EVERY 6 HOURS AS NEEDED FORWHEEZING  . Umeclidinium-Vilanterol (ANORO ELLIPTA) 62.5-25 MCG/INH AEPB Inhale 1 puff into the lungs daily.  . [DISCONTINUED] dextromethorphan (PX TUSSIN MAX) 15 MG/5ML syrup Take by mouth. Reported on 12/06/2015  . [DISCONTINUED] famotidine (PEPCID) 20 MG tablet Take 1 tablet (20 mg total) by mouth 2 (two) times daily. (Patient not taking: Reported on 12/06/2015)  . [DISCONTINUED] hydroxychloroquine (PLAQUENIL) 200 MG tablet Take 1 tablet by mouth daily. Reported on 12/06/2015   No facility-administered  encounter medications on file as of 12/06/2015.

## 2015-12-06 NOTE — Patient Instructions (Signed)
We will arrange a lung function test in [place] We will refer you to pulmonary rehabilitation in [place] We will have you follow-up with one of our pulmonologists in [place] in 4 weeks after the lung function test Keep using her oxygen and medications as you're doing Stay active, gradually increase her exercise capacity

## 2015-12-13 ENCOUNTER — Other Ambulatory Visit: Payer: Self-pay | Admitting: Internal Medicine

## 2015-12-13 ENCOUNTER — Ambulatory Visit
Admission: EM | Admit: 2015-12-13 | Discharge: 2015-12-13 | Disposition: A | Discharge status: Home / Self Care | Payer: Managed Care, Other (non HMO) | Attending: Family Medicine | Admitting: Family Medicine

## 2015-12-13 ENCOUNTER — Encounter: Payer: Self-pay | Admitting: Emergency Medicine

## 2015-12-13 DIAGNOSIS — J441 Chronic obstructive pulmonary disease with (acute) exacerbation: Secondary | ICD-10-CM | POA: Diagnosis not present

## 2015-12-13 MED ORDER — HYDROCOD POLST-CPM POLST ER 10-8 MG/5ML PO SUER: 5.0000 mL | Freq: Two times a day (BID) | ORAL | Status: DC

## 2015-12-13 MED ORDER — PREDNISONE 10 MG (21) PO TBPK: 10.0000 mg | ORAL_TABLET | Freq: Every day | ORAL | Status: DC

## 2015-12-13 MED ORDER — DOXYCYCLINE HYCLATE 100 MG PO CAPS: 100.0000 mg | ORAL_CAPSULE | Freq: Two times a day (BID) | ORAL | Status: DC

## 2015-12-13 MED ORDER — IPRATROPIUM-ALBUTEROL 0.5-2.5 (3) MG/3ML IN SOLN
3.0000 mL | Freq: Once | RESPIRATORY_TRACT | Status: AC
  Administered 2015-12-13: 3 mL via RESPIRATORY_TRACT

## 2015-12-13 NOTE — ED Provider Notes (Signed)
CSN: [id number]     Arrival date & time 12/13/15  1817 History   First MD Initiated Contact with Patient 12/13/15 1901     Chief Complaint  Patient presents with  . Cough   (Consider location/radiation/quality/duration/timing/severity/associated sxs/prior Treatment) HPI  60 year old female with COPD presents with one-week history of chest tightness coughing yellow greenish sputum and more shortness of breath than usual. Had fever on and off for 2 days never getting above 100. She saw her pulmonologist last week and today after visiting him started feeling sick. She is on constant O2 and is on O2 sat that tonight. Temperature today is 98.4 pulse 95 respirations 18 blood pressure 163/73 O2 sat is 95% on 2 L of O2 nasal cannula       Past Medical History  Diagnosis Date  . Diabetes mellitus   . Leukocytosis   . Hyperlipidemia   . COPD (chronic obstructive pulmonary disease) (HCC)   . Hyperglycemia   . Hypertension   . S/P cardiac catheterization March 2012    normal coronaries,  due to chest pain ([place])  . Tobacco abuse, in remission     quit oct during hospitalization   . Screening for colon cancer June 2011    polyps found, next one due 2014 ([place])  . Gastritis and duodenitis June 2011    EGD  . Screening for breast cancer Jul 28 2011    normal  . GERD (gastroesophageal reflux disease)   . Obesity (BMI 30-39.9)   . Depression   . Anxiety   . Asthma    Past Surgical History  Procedure Laterality Date  . Cardiac catheterization  11/22/2010    no significant disease  . Abdominal hysterectomy    . Hemorrhoid surgery    . Abdominal hysterectomy    . Bilateral oophorectomy  1995    and TAH.  noncancerous reasons   Family History  Problem Relation Age of Onset  . Coronary artery disease Mother 54  . Heart disease Mother     CABG x5  . COPD Mother 17  . Breast cancer Mother   . Kidney disease Mother   . Heart failure Father   . Angina Father   . Diabetes Father    . Depression Father   . Asthma Brother   . Asthma Brother    Social History  Substance Use Topics  . Smoking status: Former Smoker -- 1.00 packs/day for 40 years    Types: Cigarettes    Quit date: 06/27/2011  . Smokeless tobacco: Never Used  . Alcohol Use: No   OB History    No data available     Review of Systems  Constitutional: Positive for fever, chills, activity change and fatigue.  Respiratory: Positive for cough, chest tightness, shortness of breath and wheezing.   All other systems reviewed and are negative.   Allergies  Ibuprofen; Oxycodone-acetaminophen; Percocet; Sulfa antibiotics; Vicodin; and Aspirin  Home Medications   Prior to Admission medications   Medication Sig Start Date End Date Taking? Authorizing Provider  glipiZIDE (GLUCOTROL) 5 MG tablet Take 5 mg by mouth daily before breakfast.   Yes Historical Provider, MD  ACCU-CHEK SMARTVIEW test strip TEST BLOOD SUGAR FOUR TIMES DAILY 11/04/12   Sherlene Shams, MD  ALPRAZolam Prudy Feeler) 0.25 MG tablet TAKE 1 TABLET BY MOUTH EVERY NIGHT AT BEDTIME AS NEEDED 10/17/14   Sherlene Shams, MD  aspirin 81 MG EC tablet Take 81 mg by mouth daily.  Historical Provider, MD  atorvastatin (LIPITOR) 80 MG tablet Take 1 tablet (80 mg total) by mouth daily. 11/21/15   Sherlene Shamseresa L [name], MD  butalbital-acetaminophen-caffeine (FIORICET, ESGIC) [phone] MG per tablet TAKE 1 TABLET BY MOUTH EVERY 4 HOURS AS NEEDED FOR HEADACHE 01/04/15   Sherlene Shamseresa L [name], MD  chlorpheniramine-HYDROcodone [facility](TUSSIONEX PENNKINETIC ER) 10-8 MG/5ML SUER Take 5 mLs by mouth 2 (two) times daily. 12/13/15   Lutricia FeilWilliam P Nunzio Banet, PA-C  CINNAMON PO Take 1,000 mg by mouth 2 (two) times daily.    Historical Provider, MD  docusate sodium (COLACE) 100 MG capsule Take 200 mg by mouth daily. Reported on 09/20/2015    Historical Provider, MD  doxycycline (VIBRAMYCIN) 100 MG capsule Take 1 capsule (100 mg total) by mouth 2 (two) times daily. 12/13/15   Lutricia FeilWilliam P Swanson Farnell, PA-C   furosemide (LASIX) 40 MG tablet TAKE 1 TABLET BY MOUTH EVERY DAY AS NEEDED FOR LEG SWELLING AND TO MAINTAIN WEIGHT. 11/14/13   Sherlene Shamseresa L [name], MD  insulin aspart (NOVOLOG FLEXPEN) 100 UNIT/ML FlexPen INJECT 15 UNITS            SUBCUTANEOUSLY 3 TIMES A   DAY BEFORE MEALS 11/19/15   Sherlene Shamseresa L [name], MD  Insulin Detemir (LEVEMIR) 100 UNIT/ML Pen Inject 50 Units into the skin daily at 10 pm. 11/19/15   Sherlene Shamseresa L [name], MD  Insulin Pen Needle 29G X 12.7MM MISC Use as directed 03/16/15   Sherlene Shamseresa L [name], MD  Insulin Syringe-Needle U-100 (INSULIN SYRINGE .5CC/31GX5/16") 31G X 5/16" 0.5 ML MISC Test twice a day 09/15/12   Sherlene Shamseresa L [name], MD  ipratropium-albuterol (DUONEB) 0.5-2.5 (3) MG/3ML SOLN USE 1 VIAL VIA NEBULIZER EVERY 6 HOURS AS NEEDED 11/19/15   Sherlene Shamseresa L [name], MD  latanoprost (XALATAN) 0.005 % ophthalmic solution Place 1 drop into both eyes at bedtime.    Historical Provider, MD  Magnesium 250 MG TABS Take 250 mg by mouth daily.    Historical Provider, MD  PARoxetine (PAXIL) 10 MG tablet TAKE 1 TABLET(10 MG) BY MOUTH EVERY MORNING 03/16/15   Sherlene Shamseresa L [name], MD  Potassium 99 MG TABS Take 99 mg by mouth once. Reported on 09/20/2015    Historical Provider, MD  predniSONE (STERAPRED UNI-PAK 21 TAB) 10 MG (21) TBPK tablet Take 1 tablet (10 mg total) by mouth daily. Take 6 tabs by mouth daily  for 2 days, then 5 tabs for 2 days, then 4 tabs for 2 days, then 3 tabs for 2 days, 2 tabs for 2 days, then 1 tab by mouth daily for 2 days 12/13/15   Lutricia FeilWilliam P Mauricio Dahlen, PA-C  PROAIR HFA 108 [phone](90 Base) MCG/ACT inhaler USE 2 INHALATIONS ORALLY   EVERY 6 HOURS AS NEEDED Judeth CornfieldFORWHEEZING 09/19/15   Sherlene Shamseresa L [name], MD  Umeclidinium-Vilanterol (ANORO ELLIPTA) 62.5-25 MCG/INH AEPB Inhale 1 puff into the lungs daily. 04/16/15   Lupita Leashouglas B McQuaid, MD   Meds Ordered and Administered this Visit   Medications  ipratropium-albuterol (DUONEB) 0.5-2.5 (3) MG/3ML nebulizer solution 3 mL (3 mLs Nebulization Given 12/13/15 1915)    BP 163/73 mmHg  Pulse  99  Temp(Src) 98.4 F (36.9 C) (Tympanic)  Resp 18  Ht 5\' 3"  (1.6 m)  Wt 267 lb (121.11 kg)  BMI 47.31 kg/m2  SpO2 98% No data found.   Physical Exam  Constitutional: She is oriented to person, place, and time. She appears well-developed and well-nourished. No distress.  HENT:  Head: Normocephalic and atraumatic.  Right Ear: External ear normal.  Left  Ear: External ear normal.  Nose: Nose normal.  Mouth/Throat: Oropharynx is clear and moist. No oropharyngeal exudate.  Eyes: Conjunctivae are normal. Pupils are equal, round, and reactive to light.  Neck: Normal range of motion. Neck supple.  Pulmonary/Chest: No respiratory distress. She has wheezes. She has no rales.  Initial lungs shows recent air movement. She does have some scattered wheezing on expiration. No crackles are appreciated. There is no stridor.  Musculoskeletal: Normal range of motion. She exhibits no edema or tenderness.  Lymphadenopathy:    She has no cervical adenopathy.  Neurological: She is alert and oriented to person, place, and time.  Skin: Skin is warm and dry. She is not diaphoretic.  Psychiatric: She has a normal mood and affect. Her behavior is normal. Judgment and thought content normal.  Nursing note and vitals reviewed.   ED Course  Procedures (including critical care time)  Labs Review Labs Reviewed - No data to display  Imaging Review No results found.   Visual Acuity Review  Right Eye Distance:   Left Eye Distance:   Bilateral Distance:    Right Eye Near:   Left Eye Near:    Bilateral Near:     Medications  ipratropium-albuterol (DUONEB) 0.5-2.5 (3) MG/3ML nebulizer solution 3 mL (3 mLs Nebulization Given 12/13/15 1915)      MDM   1. COPD with exacerbation [facility])    Discharge Medication List as of 12/13/2015  7:54 PM    START taking these medications   Details  doxycycline (VIBRAMYCIN) 100 MG capsule Take 1 capsule (100 mg total) by mouth 2 (two) times daily., Starting  12/13/2015, Until Discontinued, Normal    predniSONE (STERAPRED UNI-PAK 21 TAB) 10 MG (21) TBPK tablet Take 1 tablet (10 mg total) by mouth daily. Take 6 tabs by mouth daily  for 2 days, then 5 tabs for 2 days, then 4 tabs for 2 days, then 3 tabs for 2 days, 2 tabs for 2 days, then 1 tab by mouth daily for 2 days, Starting 12/13/2015, Until Discontinued, Pr int      Plan: 1. Test/x-ray results and diagnosis reviewed with patient 2. rx as per orders; risks, benefits, potential side effects reviewed with patient 3. Recommend supportive treatment with Use of nebulizers as necessary. Also start her on some doxycycline for bacterial exacerbation for her COPD. He also asked for prescription for Tussionex which I gave her but she should use only half a teaspoon. She should follow-up with her primary care if she is not improving. 4. F/u prn if symptoms worsen or don't improve     Lutricia Feil, PA-C 12/13/15 1957

## 2015-12-13 NOTE — Discharge Instructions (Signed)

## 2015-12-13 NOTE — ED Notes (Signed)
Patient c/o cough and chest for a week.  Patient reports low grade fever.

## 2015-12-28 ENCOUNTER — Telehealth: Payer: Self-pay | Admitting: Internal Medicine

## 2015-12-28 ENCOUNTER — Telehealth: Payer: Self-pay | Admitting: *Deleted

## 2015-12-28 NOTE — Telephone Encounter (Signed)
Patient was seen at urgent care, she was diagnosis with COPD exasperation. She was give doxycyline and prednisone and completed her medication as prescribed. She currently has green mucus, sore throat,cough with a low grad fever and *trouble breathing. She was advised to call the office to get an antibiotic and prednisone when her mucus turned green.  Pt [phone] *transfered to the nurse line.

## 2015-12-28 NOTE — Telephone Encounter (Signed)
[facility] [place] [facility] - [place] TELEPHONE ADVICE RECORD [facility] Patient Name: Catherine BruinsDEBORAH WHITEHEA D DOB: Nov 17, 1955 Initial Comment Caller states saw MD 2-3 wks ago; was just beginning to get sick; [facility]; just finished meds and sick again; coughing up green stuff w/sore throat, low grade temp; difficulty breathing at times; Nurse Assessment Nurse: Lane HackerHarley, RN, [place] Date/Time (Eastern Time): 12/28/2015 2:06:11 PM Confirm and document reason for call. If symptomatic, describe symptoms. You must click the next button to save text entered. ---Caller states saw MD 2-3 wks ago, and was just beginning to get sick. Then 2 weekends ago, she went to [facility] and diagnosed with COPD exacerbation. She just finished Doxycycline and Prednisone this week. Felt better for 2 days. Then stared with cough/sore throat/runny nose yesterday. Coughing up green phlegm. c/o difficulty breathing at times, not now. "Always hard to take a DB, and with COPD exacerbation it is worse." Using Duo-neb nebulizer and proair inhaler. At first denied chest pain, but now c/o CP 2-3/10 with coughing only. -- No fever or wheezing. Has the patient traveled out of the country within the last 30 days? ---Not Applicable Does the patient have any new or worsening symptoms? ---Yes Will a triage be completed? ---Yes Related visit to physician within the last 2 weeks? ---Yes Does the PT have any chronic conditions? (i.e. diabetes, asthma, etc.) ---Yes List chronic conditions. ---COPD, IDDM Is this a behavioral health or substance abuse call? ---No Guidelines Guideline Title Affirmed Question Affirmed Notes  Multiple attempts made to reach caller after disconnected.  Tried 2 lines.  One busy and  One left msg. Final Disposition User Clinical Call MiddlebergHarley, RN, [place]

## 2015-12-30 ENCOUNTER — Ambulatory Visit
Admission: EM | Admit: 2015-12-30 | Discharge: 2015-12-30 | Disposition: A | Discharge status: Home / Self Care | Payer: Managed Care, Other (non HMO) | Attending: Family Medicine | Admitting: Family Medicine

## 2015-12-30 DIAGNOSIS — J101 Influenza due to other identified influenza virus with other respiratory manifestations: Secondary | ICD-10-CM | POA: Diagnosis not present

## 2015-12-30 LAB — RAPID INFLUENZA A&B ANTIGENS
Influenza A (ARMC): NEGATIVE
Influenza B (ARMC): POSITIVE — AB

## 2015-12-30 LAB — RAPID STREP SCREEN (MED CTR MEBANE ONLY): Streptococcus, Group A Screen (Direct): NEGATIVE

## 2015-12-30 MED ORDER — OSELTAMIVIR PHOSPHATE 75 MG PO CAPS: 75.0000 mg | ORAL_CAPSULE | Freq: Two times a day (BID) | ORAL | Status: DC

## 2015-12-30 NOTE — Discharge Instructions (Signed)
Rest, increase fluids, use Tylenol as needed for fever or pain Continue your current inhalers Complete tamiflu  Influenza, Adult Influenza ("the flu") is a viral infection of the respiratory tract. It occurs more often in winter months because people spend more time in close contact with one another. Influenza can make you feel very sick. Influenza easily spreads from person to person (contagious). CAUSES  Influenza is caused by a virus that infects the respiratory tract. You can catch the virus by breathing in droplets from an infected person's cough or sneeze. You can also catch the virus by touching something that was recently contaminated with the virus and then touching your mouth, nose, or eyes. RISKS AND COMPLICATIONS You may be at risk for a more severe case of influenza if you smoke cigarettes, have diabetes, have chronic heart disease (such as heart failure) or lung disease (such as asthma), or if you have a weakened immune system. Elderly people and pregnant women are also at risk for more serious infections. The most common problem of influenza is a lung infection (pneumonia). Sometimes, this problem can require emergency medical care and may be life threatening. SIGNS AND SYMPTOMS  Symptoms typically last 4 to 10 days and may include:  Fever.  Chills.  Headache, body aches, and muscle aches.  Sore throat.  Chest discomfort and cough.  Poor appetite.  Weakness or feeling tired.  Dizziness.  Nausea or vomiting. DIAGNOSIS  Diagnosis of influenza is often made based on your history and a physical exam. A nose or throat swab test can be done to confirm the diagnosis. TREATMENT  In mild cases, influenza goes away on its own. Treatment is directed at relieving symptoms. For more severe cases, your health care provider may prescribe antiviral medicines to shorten the sickness. Antibiotic medicines are not effective because the infection is caused by a virus, not by  bacteria. HOME CARE INSTRUCTIONS  Take medicines only as directed by your health care provider.  Use a cool mist humidifier to make breathing easier.  Get plenty of rest until your temperature returns to normal. This usually takes 3 to 4 days.  Drink enough fluid to keep your urine clear or pale yellow.  Cover yourmouth and nosewhen coughing or sneezing,and wash your handswellto prevent thevirusfrom spreading.  Stay homefromwork orschool untilthe fever is gonefor at least 871full day. PREVENTION  An annual influenza vaccination (flu shot) is the best way to avoid getting influenza. An annual flu shot is now routinely recommended for all adults in the [place] SEEK MEDICAL CARE IF:  You experiencechest pain, yourcough worsens,or you producemore mucus.  Youhave nausea,vomiting, ordiarrhea.  Your fever returns or gets worse. SEEK IMMEDIATE MEDICAL CARE IF:  You havetrouble breathing, you become short of breath,or your skin ornails becomebluish.  You have severe painor stiffnessin the neck.  You develop a sudden headache, or pain in the face or ear.  You have nausea or vomiting that you cannot control. MAKE SURE YOU:   Understand these instructions.  Will watch your condition.  Will get help right away if you are not doing well or get worse.   This information is not intended to replace advice given to you by your health care provider. Make sure you discuss any questions you have with your health care provider.   Document Released: 08/22/2000 Document Revised: 09/15/2014 Document Reviewed: 11/24/2011 Elsevier Interactive Patient Education [employer]

## 2015-12-30 NOTE — ED Notes (Signed)
Pt was here 2 weeks ago with COPD and pt just finished antibiotics 2 days ago and Sx reappeared after finishing antibiotics. Pt just had one day of having diarrhea and throwing up but improved now onset 2 days getting worst with cough and fever, SOB chest congestion. Sore throat and HA.

## 2015-12-30 NOTE — ED Provider Notes (Signed)
CSN: [id number]     Arrival date & time 12/30/15  1457 History   First MD Initiated Contact with Patient 12/30/15 1546     Chief Complaint  Patient presents with  . Cough    HPI   Catherine [name] is a pleasant 60 y.o. female who was seen by Colon Flattery PAC on 12/13/2015-please refer to that record.  She states about midnight 2 days ago she developed nausea, vomiting, diarrhea, cough, congestion, fever and sore throat. She's had fatigue and body aches as well. She did feel better after antibiotic therapy and prednisone for about 2 days. She continues to use her pro-air and DuoNeb. She has denies any ill contacts she had a MAXIMUM TEMPERATURE of 101.6. She did not receive a flu shot she is diabetic and has chronic COPD.  Past Medical History  Diagnosis Date  . Diabetes mellitus   . Leukocytosis   . Hyperlipidemia   . COPD (chronic obstructive pulmonary disease) (HCC)   . Hyperglycemia   . Hypertension   . S/P cardiac catheterization March 2012    normal coronaries,  due to chest pain ([facility])  . Tobacco abuse, in remission     quit oct during hospitalization   . Screening for colon cancer June 2011    polyps found, next one due 2014 ([facility])  . Gastritis and duodenitis June 2011    EGD  . Screening for breast cancer Jul 28 2011    normal  . GERD (gastroesophageal reflux disease)   . Obesity (BMI 30-39.9)   . Depression   . Anxiety   . Asthma    Past Surgical History  Procedure Laterality Date  . Cardiac catheterization  11/22/2010    no significant disease  . Abdominal hysterectomy    . Hemorrhoid surgery    . Abdominal hysterectomy    . Bilateral oophorectomy  1995    and TAH.  noncancerous reasons   Family History  Problem Relation Age of Onset  . Coronary artery disease Mother 11  . Heart disease Mother     CABG x5  . COPD Mother 25  . Breast cancer Mother   . Kidney disease Mother   . Heart failure Father   . Angina Father   . Diabetes Father   . Depression  Father   . Asthma Brother   . Asthma Brother    Social History  Substance Use Topics  . Smoking status: Former Smoker -- 1.00 packs/day for 40 years    Types: Cigarettes    Quit date: 06/27/2011  . Smokeless tobacco: Never Used  . Alcohol Use: No   OB History    No data available     Review of Systems  Constitutional: Positive for fever, chills, activity change, appetite change and fatigue.  HENT: Positive for congestion, postnasal drip and sore throat. Negative for ear pain, sinus pressure and tinnitus.   Eyes: Negative.   Respiratory: Positive for cough and wheezing.   Cardiovascular: Negative.   Gastrointestinal: Negative.   Endocrine: Negative.   Genitourinary: Negative.   Musculoskeletal: Positive for myalgias.  Skin: Negative.   Neurological: Negative.   Hematological: Negative.   Psychiatric/Behavioral: Negative.     Allergies  Ibuprofen; Oxycodone-acetaminophen; Percocet; Sulfa antibiotics; Vicodin; and Aspirin  Home Medications   Prior to Admission medications   Medication Sig Start Date End Date Taking? Authorizing Provider  ACCU-CHEK SMARTVIEW test strip TEST BLOOD SUGAR FOUR TIMES DAILY 11/04/12  Yes Sherlene Shams, MD  ALPRAZolam Prudy Feeler) 0.25  MG tablet TAKE 1 TABLET BY MOUTH EVERY NIGHT AT BEDTIME AS NEEDED 10/17/14  Yes Sherlene Shamseresa L [name], MD  aspirin 81 MG EC tablet Take 81 mg by mouth daily.     Yes Historical Provider, MD  atorvastatin (LIPITOR) 80 MG tablet Take 1 tablet (80 mg total) by mouth daily. 11/21/15  Yes Sherlene Shamseresa L [name], MD  butalbital-acetaminophen-caffeine (FIORICET, ESGIC) 50-325-40 MG per tablet TAKE 1 TABLET BY MOUTH EVERY 4 HOURS AS NEEDED FOR HEADACHE 01/04/15  Yes Sherlene Shamseresa L [name], MD  chlorpheniramine-HYDROcodone (TUSSIONEX PENNKINETIC ER) 10-8 MG/5ML SUER Take 5 mLs by mouth 2 (two) times daily. 12/13/15  Yes Lutricia FeilWilliam P Roemer, PA-C  CINNAMON PO Take 1,000 mg by mouth 2 (two) times daily.   Yes Historical Provider, MD  docusate sodium (COLACE) 100  MG capsule Take 200 mg by mouth daily. Reported on 09/20/2015   Yes Historical Provider, MD  furosemide (LASIX) 40 MG tablet TAKE 1 TABLET BY MOUTH EVERY DAY AS NEEDED FOR LEG SWELLING AND TO MAINTAIN WEIGHT. 11/14/13  Yes Sherlene Shamseresa L [name], MD  glipiZIDE (GLUCOTROL) 5 MG tablet Take 5 mg by mouth daily before breakfast.   Yes Historical Provider, MD  insulin aspart (NOVOLOG FLEXPEN) 100 UNIT/ML FlexPen INJECT 15 UNITS            SUBCUTANEOUSLY 3 TIMES A   DAY BEFORE MEALS 11/19/15  Yes Sherlene Shamseresa L [name], MD  Insulin Detemir (LEVEMIR) 100 UNIT/ML Pen Inject 50 Units into the skin daily at 10 pm. 11/19/15  Yes Sherlene Shamseresa L [name], MD  Insulin Pen Needle 29G X 12.7MM MISC Use as directed 03/16/15  Yes Sherlene Shamseresa L [name], MD  Insulin Syringe-Needle U-100 (INSULIN SYRINGE .5CC/31GX5/16") 31G X 5/16" 0.5 ML MISC Test twice a day 09/15/12  Yes Sherlene Shamseresa L [name], MD  ipratropium-albuterol (DUONEB) 0.5-2.5 (3) MG/3ML SOLN USE 1 VIAL VIA NEBULIZER EVERY 6 HOURS AS NEEDED 11/19/15  Yes Sherlene Shamseresa L [name], MD  latanoprost (XALATAN) 0.005 % ophthalmic solution Place 1 drop into both eyes at bedtime.   Yes Historical Provider, MD  Magnesium 250 MG TABS Take 250 mg by mouth daily.   Yes Historical Provider, MD  PARoxetine (PAXIL) 10 MG tablet TAKE 1 TABLET(10 MG) BY MOUTH EVERY MORNING 03/16/15  Yes Sherlene Shamseresa L [name], MD  Potassium 99 MG TABS Take 99 mg by mouth once. Reported on 09/20/2015   Yes Historical Provider, MD  PROAIR HFA 108 [phone](90 Base) MCG/ACT inhaler USE 2 INHALATIONS ORALLY   EVERY 6 HOURS AS NEEDED FORWHEEZING 09/19/15  Yes Sherlene Shamseresa L [name], MD  PROAIR HFA 108 [phone](90 Base) MCG/ACT inhaler USE 2 INHALATIONS ORALLY   EVERY 6 HOURS AS NEEDED FORWHEEZING 12/14/15  Yes Sherlene Shamseresa L [name], MD  Umeclidinium-Vilanterol (ANORO ELLIPTA) 62.5-25 MCG/INH AEPB Inhale 1 puff into the lungs daily. 04/16/15  Yes Lupita Leashouglas B McQuaid, MD  doxycycline (VIBRAMYCIN) 100 MG capsule Take 1 capsule (100 mg total) by mouth 2 (two) times daily. 12/13/15   Lutricia FeilWilliam P Roemer, PA-C   oseltamivir (TAMIFLU) 75 MG capsule Take 1 capsule (75 mg total) by mouth every 12 (twelve) hours. 12/30/15   Joselyn ArrowKandice L Valaree Fresquez, NP  predniSONE (STERAPRED UNI-PAK 21 TAB) 10 MG (21) TBPK tablet Take 1 tablet (10 mg total) by mouth daily. Take 6 tabs by mouth daily  for 2 days, then 5 tabs for 2 days, then 4 tabs for 2 days, then 3 tabs for 2 days, 2 tabs for 2 days, then 1 tab by mouth daily for 2 days 12/13/15  Lutricia Feil, PA-C   Meds Ordered and Administered this Visit  Medications - No data to display  BP 119/69 mmHg  Pulse 88  Temp(Src) 98 F (36.7 C) (Oral)  Ht  (1.6 m)  Wt 259 lb (117.482 kg)  BMI 45.89 kg/m2  SpO2 95% No data found.   Physical Exam  Constitutional: She is oriented to person, place, and time. She appears well-developed and well-nourished.  Non-toxic appearance. She has a sickly appearance. No distress.  HENT:  Right Ear: Hearing, tympanic membrane and ear canal normal.  Left Ear: Hearing, tympanic membrane and ear canal normal.  Nose: Mucosal edema and rhinorrhea present. Right sinus exhibits no maxillary sinus tenderness and no frontal sinus tenderness. Left sinus exhibits no maxillary sinus tenderness and no frontal sinus tenderness.  Mouth/Throat: Uvula is midline, oropharynx is clear and moist and mucous membranes are normal.  Neck: Normal range of motion. Neck supple. No thyroid mass and no thyromegaly present.  Cardiovascular: Regular rhythm.   Murmur heard. Pulmonary/Chest: No apnea and no tachypnea. No respiratory distress. She has decreased breath sounds in the right upper field, the right middle field, the right lower field, the left upper field, the left middle field and the left lower field. She has no wheezes. She has no rhonchi. She has no rales.  Abdominal: Soft. Normal appearance and bowel sounds are normal. There is no hepatosplenomegaly. There is no tenderness. There is no rebound.  Lymphadenopathy:    She has no cervical adenopathy.   Neurological: She is alert and oriented to person, place, and time. She has normal strength.  Skin: Skin is warm and dry. She is not diaphoretic. No pallor.  Psychiatric: She has a normal mood and affect. Her speech is normal and behavior is normal.  Nursing note and vitals reviewed.   ED Course  Procedures N/A  Labs Review Labs Reviewed  RAPID INFLUENZA A&B ANTIGENS ([facility] ONLY) - Abnormal; Notable for the following:    Influenza B ([facility]) POSITIVE (*)    All other components within normal limits  RAPID STREP SCREEN (NOT AT [facility])  CULTURE, GROUP A STREP [facility])    Imaging Review No results found.  MDM   1. Influenza B    Patient has underlying COPD which has been well controlled up over the last 3 weeks until acute flu symptoms started.  She is also diabetic.  We reviewed warning signs to return to clinic including increasing shortness of breath, uncontrolled fever, worsening symptoms.  Plan: Test results and diagnosis reviewed with patient Rx as per orders;  benefits, risks, potential side effects reviewed  Recommend supportive treatment with rest, increase fluids, Tylenol as needed for fever Continue current inhalers Seek additional medical care if symptoms worsen or are not improving     Joselyn Arrow, NP 12/30/15 1650

## 2016-01-01 ENCOUNTER — Telehealth: Payer: Self-pay | Admitting: Internal Medicine

## 2016-01-01 LAB — CULTURE, GROUP A STREP (THRC)

## 2016-01-01 MED ORDER — PREDNISONE 10 MG (21) PO TBPK: ORAL_TABLET | ORAL | Status: DC

## 2016-01-01 NOTE — Telephone Encounter (Signed)
Spoke with the patient and verbalized understanding.

## 2016-01-01 NOTE — Telephone Encounter (Signed)
Pt went to an Urgent Care on Sunday. She tested positive for the flu. Pt is feeling worse today then she did on Sunday. Pt wants to know what she should do.

## 2016-01-01 NOTE — Telephone Encounter (Signed)
Extended prednisone taper sent to pharmacy:  60 mg daily x 3,  Then 50 mg daily x 3,  40 mg daily x 2 ,  Etc.  If doesn't improve in 48 hours needs to be seen or go to ER

## 2016-01-01 NOTE — Telephone Encounter (Signed)
Was seen at Urgent care and diagnosis ed with influenza B .Has been taking the tamiflu every twelve hours   Symptoms : SOB has increased, almost fallen a few times today she has lost her balance. Nasal drainage yesterday but head stuffy today,  Cough- productive at times, light green drainage Low grade fevers around 100.   Fluids encouraged and tolerating No vomiting or diarrhea in past two days.   Little appetite.  Was told to f/u if not feeling better.  Please advise.  Thanks

## 2016-01-02 ENCOUNTER — Ambulatory Visit (INDEPENDENT_AMBULATORY_CARE_PROVIDER_SITE_OTHER)
Admission: RE | Admit: 2016-01-02 | Discharge: 2016-01-02 | Disposition: A | Discharge status: Home / Self Care | Payer: Managed Care, Other (non HMO) | Source: Ambulatory Visit | Attending: Acute Care | Admitting: Acute Care

## 2016-01-02 ENCOUNTER — Encounter: Payer: Self-pay | Admitting: Acute Care

## 2016-01-02 ENCOUNTER — Ambulatory Visit (INDEPENDENT_AMBULATORY_CARE_PROVIDER_SITE_OTHER): Payer: Managed Care, Other (non HMO) | Admitting: Acute Care

## 2016-01-02 ENCOUNTER — Other Ambulatory Visit (INDEPENDENT_AMBULATORY_CARE_PROVIDER_SITE_OTHER): Payer: Managed Care, Other (non HMO)

## 2016-01-02 ENCOUNTER — Telehealth: Payer: Self-pay | Admitting: Pulmonary Disease

## 2016-01-02 VITALS — BP 140/78 | HR 119 | Ht 63.0 in | Wt 267.0 lb

## 2016-01-02 DIAGNOSIS — G4733 Obstructive sleep apnea (adult) (pediatric): Secondary | ICD-10-CM

## 2016-01-02 DIAGNOSIS — J101 Influenza due to other identified influenza virus with other respiratory manifestations: Secondary | ICD-10-CM

## 2016-01-02 DIAGNOSIS — J441 Chronic obstructive pulmonary disease with (acute) exacerbation: Secondary | ICD-10-CM | POA: Diagnosis not present

## 2016-01-02 DIAGNOSIS — J432 Centrilobular emphysema: Secondary | ICD-10-CM | POA: Diagnosis not present

## 2016-01-02 DIAGNOSIS — R0602 Shortness of breath: Secondary | ICD-10-CM | POA: Diagnosis not present

## 2016-01-02 DIAGNOSIS — R509 Fever, unspecified: Secondary | ICD-10-CM | POA: Diagnosis not present

## 2016-01-02 DIAGNOSIS — R05 Cough: Secondary | ICD-10-CM | POA: Diagnosis not present

## 2016-01-02 LAB — CBC WITH DIFFERENTIAL/PLATELET
Basophils Absolute: 0 10*3/uL (ref 0.0–0.1)
Basophils Relative: 0.2 % (ref 0.0–3.0)
EOS PCT: 0 % (ref 0.0–5.0)
Eosinophils Absolute: 0 10*3/uL (ref 0.0–0.7)
HCT: 40.3 % (ref 36.0–46.0)
Hemoglobin: 13.3 g/dL (ref 12.0–15.0)
LYMPHS ABS: 0.9 10*3/uL (ref 0.7–4.0)
Lymphocytes Relative: 8.5 % — ABNORMAL LOW (ref 12.0–46.0)
MCHC: 33 g/dL (ref 30.0–36.0)
MCV: 83.2 fl (ref 78.0–100.0)
MONO ABS: 0.2 10*3/uL (ref 0.1–1.0)
Monocytes Relative: 1.5 % — ABNORMAL LOW (ref 3.0–12.0)
NEUTROS ABS: 9.7 10*3/uL — AB (ref 1.4–7.7)
PLATELETS: 334 10*3/uL (ref 150.0–400.0)
RBC: 4.85 Mil/uL (ref 3.87–5.11)
RDW: 14.4 % (ref 11.5–15.5)
WBC: 10.9 10*3/uL — ABNORMAL HIGH (ref 4.0–10.5)

## 2016-01-02 MED ORDER — LEVOFLOXACIN 500 MG PO TABS: 500.0000 mg | ORAL_TABLET | Freq: Every day | ORAL | Status: DC

## 2016-01-02 NOTE — Telephone Encounter (Signed)
Called, spoke with pt.  She was seen at Urgent Care on Sunday and dx with Influenza B.  Was given Tamiflu bid x 5 days.  Pt states symptoms are worsening - having increased cough, SOB, and sweats.  Temp still up to 100.  OV scheduled for today at 3 pm with SG.  Pt confirmed appt and is to seek emergency care if needed.  She verbalized understanding and voiced no further questions or concerns at this time.

## 2016-01-02 NOTE — Progress Notes (Signed)
Subjective:    Patient ID: Catherine [name], female    DOB: [birth year], 60 y.o.   MRN: [id number]  HPI   60 year old female with history of COPD, HTN, GERD, pulmonary nodule, OSA, and allergic rhinitis. She had a severe course of hospitalizations in 2013.  Significant Events/ Procedures: 12/26/2015: Completed Doxy x 7 days with Pred taper Felt better x 3 days 12/30/2015: Positive Influenza B per Swab: [facility] 12/30/2015: Started on treatment dose Tamiflu x 5 days 01/01/2016: Prednisone Taper per PCP for worsening cough/ fever  01/02/2016: CXR  IMPRESSION: Increased interstitial markings bilaterally over baseline likely reflects acute bronchitic changes. Mild chronic hyperinflation consistent with known asthma-COPD. There is no alveolar pneumonia nor CHF  01/02/2016   Acute OV: Pt. Presents to the office today with complaints of Fever, sore throat, headache, coughing up green secretions. She denies nasal secretions.Prior to being diagnosed with the flu, she had need treated with Doxy and Pred taper for bronchitis per PCP. She  states she is continuing to  Have  a fever at night, and night sweats with the prednisone.CXR today shows no pneumonia, but bronchitis.Will treat with antibiotic given continued fever, purulent secretions and baseline chronic lung disease superimposed on Influenza B. Pt. States she has responded well to Levaquin in the past, with no adverse drug reactions.She states she is compliant with her CPAP.   Current outpatient prescriptions:  .  ACCU-CHEK SMARTVIEW test strip, TEST BLOOD SUGAR FOUR TIMES DAILY, Disp: 100 each, Rfl: 12 .  ALPRAZolam (XANAX) 0.25 MG tablet, TAKE 1 TABLET BY MOUTH EVERY NIGHT AT BEDTIME AS NEEDED, Disp: 30 tablet, Rfl: 0 .  aspirin 81 MG EC tablet, Take 81 mg by mouth daily.  , Disp: , Rfl:  .  atorvastatin (LIPITOR) 80 MG tablet, Take 1 tablet (80 mg total) by mouth daily., Disp: 90 tablet, Rfl: 1 .   butalbital-acetaminophen-caffeine (FIORICET, ESGIC) 50-325-40 MG per tablet, TAKE 1 TABLET BY MOUTH EVERY 4 HOURS AS NEEDED FOR HEADACHE, Disp: 30 tablet, Rfl: 3 .  chlorpheniramine-HYDROcodone (TUSSIONEX PENNKINETIC ER) 10-8 MG/5ML SUER, Take 5 mLs by mouth 2 (two) times daily., Disp: 115 mL, Rfl: 0 .  CINNAMON PO, Take 1,000 mg by mouth 2 (two) times daily., Disp: , Rfl:  .  docusate sodium (COLACE) 100 MG capsule, Take 200 mg by mouth daily. Reported on 09/20/2015, Disp: , Rfl:  .  furosemide (LASIX) 40 MG tablet, TAKE 1 TABLET BY MOUTH EVERY DAY AS NEEDED FOR LEG SWELLING AND TO MAINTAIN WEIGHT., Disp: 30 tablet, Rfl: 5 .  glipiZIDE (GLUCOTROL) 5 MG tablet, Take 5 mg by mouth daily before breakfast., Disp: , Rfl:  .  insulin aspart (NOVOLOG FLEXPEN) 100 UNIT/ML FlexPen, INJECT 15 UNITS            SUBCUTANEOUSLY 3 TIMES A   DAY BEFORE MEALS, Disp: 45 mL, Rfl: 0 .  Insulin Detemir (LEVEMIR) 100 UNIT/ML Pen, Inject 50 Units into the skin daily at 10 pm., Disp: 45 mL, Rfl: 3 .  Insulin Pen Needle 29G X 12.7MM MISC, Use as directed, Disp: 200 each, Rfl: 1 .  Insulin Syringe-Needle U-100 (INSULIN SYRINGE .5CC/31GX5/16") 31G X 5/16" 0.5 ML MISC, Test twice a day, Disp: 100 each, Rfl: 11 .  ipratropium-albuterol (DUONEB) 0.5-2.5 (3) MG/3ML SOLN, USE 1 VIAL VIA NEBULIZER EVERY 6 HOURS AS NEEDED, Disp: 1080 mL, Rfl: 3 .  latanoprost (XALATAN) 0.005 % ophthalmic solution, Place 1 drop into both eyes at bedtime., Disp: , Rfl:  .  Magnesium 250 MG TABS, Take 250 mg by mouth daily., Disp: , Rfl:  .  oseltamivir (TAMIFLU) 75 MG capsule, Take 1 capsule (75 mg total) by mouth every 12 (twelve) hours., Disp: 10 capsule, Rfl: 0 .  PARoxetine (PAXIL) 10 MG tablet, TAKE 1 TABLET(10 MG) BY MOUTH EVERY MORNING, Disp: 90 tablet, Rfl: 0 .  Potassium 99 MG TABS, Take 99 mg by mouth once. Reported on 09/20/2015, Disp: , Rfl:  .  predniSONE (STERAPRED UNI-PAK 21 TAB) 10 MG (21) TBPK tablet, Take 6 tabs by mouth daily  for 3  days, then reduce by 1 tablet every 2 days until gone, Disp: 48 tablet, Rfl: 0 .  PROAIR HFA 108 (90 Base) MCG/ACT inhaler, USE 2 INHALATIONS ORALLY   EVERY 6 HOURS AS NEEDED FORWHEEZING, Disp: 17 g, Rfl: 1 .  PROAIR HFA 108 (90 Base) MCG/ACT inhaler, USE 2 INHALATIONS ORALLY   EVERY 6 HOURS AS NEEDED FORWHEEZING, Disp: 17 g, Rfl: 1 .  Umeclidinium-Vilanterol (ANORO ELLIPTA) 62.5-25 MCG/INH AEPB, Inhale 1 puff into the lungs daily., Disp: 90 each, Rfl: 2 .  levofloxacin (LEVAQUIN) 500 MG tablet, Take 1 tablet (500 mg total) by mouth daily., Disp: 7 tablet, Rfl: 0   Past Medical History  Diagnosis Date  . Diabetes mellitus   . Leukocytosis   . Hyperlipidemia   . COPD (chronic obstructive pulmonary disease) (HCC)   . Hyperglycemia   . Hypertension   . S/P cardiac catheterization March 2012    normal coronaries,  due to chest pain ([place])  . Tobacco abuse, in remission     quit oct during hospitalization   . Screening for colon cancer June 2011    polyps found, next one due 2014 ([facility])  . Gastritis and duodenitis June 2011    EGD  . Screening for breast cancer Jul 28 2011    normal  . GERD (gastroesophageal reflux disease)   . Obesity (BMI 30-39.9)   . Depression   . Anxiety   . Asthma     Allergies  Allergen Reactions  . Ibuprofen   . Oxycodone-Acetaminophen   . Percocet [Oxycodone-Acetaminophen]   . Sulfa Antibiotics Swelling    Patient reports caused cellulitis  . Vicodin [Hydrocodone-Acetaminophen]   . Aspirin Other (See Comments)      Review of Systems Constitutional:   No  weight loss, + night sweats with prednisone,  +Fevers, +chills, +fatigue, or  lassitude.  HEENT:   No headaches,  Difficulty swallowing,  Tooth/dental problems, or  +Sore throat,                No sneezing, itching, ear ache, nasal congestion, post nasal drip,   CV:  No chest pain,  Orthopnea, PND, swelling in lower extremities, anasarca, dizziness, palpitations, syncope.   GI  No  heartburn, indigestion, abdominal pain, nausea, vomiting, diarrhea, change in bowel habits, loss of appetite, bloody stools.   Resp: + shortness of breath with exertion not at rest.  + excess mucus, + productive cough,  No non-productive cough,  No coughing up of blood.  + change in color of mucus.  No wheezing.  No chest wall deformity  Skin: no rash or lesions.  GU: no dysuria, change in color of urine, no urgency or frequency.  No flank pain, no hematuria   MS:  No joint pain or swelling.  No decreased range of motion.  No back pain.  Psych:  No change in mood or affect. No depression or anxiety.  No  memory loss.        Objective:   Physical Exam BP 140/78 mmHg  Pulse 119  Ht  (1.6 m)  Wt 267 lb (121.11 kg)  BMI 47.31 kg/m2  SpO2 96%   Physical Exam:  General- No distress,  A&Ox3, obese wearing nasal oxygen ENT: No sinus tenderness, TM clear, pale nasal mucosa, no oral exudate,no post nasal drip, no LAN Cardiac: S1, S2, regular rate and rhythm, no murmur Chest: No wheeze/ rales/ dullness; no accessory muscle use, no nasal flaring, no sternal retractions Abd.: Soft Non-tender Ext: No clubbing cyanosis, edema Neuro:  normal strength Skin: No rashes, warm and dry Psych: normal mood and behavior   Catherine [name], AGACNP-BC [facility] Pulmonary/Critical Care Medicine  01/02/2016     Assessment & Plan:

## 2016-01-02 NOTE — Assessment & Plan Note (Addendum)
Bronchitis/ COPD Flare superimposed on Influenza B per swab Plan: CXR today CBC today Chest x ray does not indicate pneumonia. It does appear that you have bronchitis. Continue oxygen We will prescribe Levaquin 500 mg once daily x 7 days PFT's when better Consider daily azithromycin if patient will agree to serial LFT's and EKG's to monitor.  Follow [place] in 2 weeks at the [place]. Please contact office for sooner follow up if symptoms do not improve or worsen or seek emergency care

## 2016-01-02 NOTE — Assessment & Plan Note (Signed)
Plan Continue on CPAP at bedtime. You appear to be benefiting from the treatment Goal is to wear for at least 4-6 hours each night for maximal clinical benefit. Continue to work on weight loss, as the link between excess weight  and sleep apnea is well established.  Do not drive if sleepy. Follow up with Dr.McQuaid In 6 months or before as needed.

## 2016-01-02 NOTE — Patient Instructions (Addendum)
I am sorry you are not feeling well today. We will do a CBC  And CXR today. Chest x ray does not indicate pneumonia. It does appear that you have bronchitis. We will prescribe Levaquin 500 mg once daily x 7 days Follow up in 2 weeks at the [place] office. Please contact office for sooner follow up if symptoms do not improve or worsen or seek emergency care

## 2016-01-03 NOTE — Progress Notes (Signed)
Reviewed, agree 

## 2016-01-08 ENCOUNTER — Ambulatory Visit: Payer: Managed Care, Other (non HMO) | Admitting: Internal Medicine

## 2016-01-08 ENCOUNTER — Other Ambulatory Visit: Payer: Self-pay | Admitting: Internal Medicine

## 2016-01-18 ENCOUNTER — Telehealth: Payer: Self-pay | Admitting: Internal Medicine

## 2016-01-18 NOTE — Telephone Encounter (Signed)
Pt also dropped of Health Provider Screening forms and Disability forms to be completed by Dr. Darrick Huntsmanullo. Paper work is in Dr. Melina Schoolsullo's box. Please call patient when completed.

## 2016-01-18 NOTE — Telephone Encounter (Signed)
Pt dropped off her sugar readings. Papers are in Dr. Melina Schoolsullo's box.

## 2016-01-21 NOTE — Telephone Encounter (Signed)
Paper work not dr. Darrick Huntsmanullo box or sugar readings.

## 2016-01-21 NOTE — Telephone Encounter (Signed)
These should be in the red folder for today.

## 2016-01-21 NOTE — Telephone Encounter (Signed)
i received no such papers

## 2016-01-22 NOTE — Telephone Encounter (Signed)
Placed paper work in [employer] folder for today

## 2016-01-23 ENCOUNTER — Telehealth: Payer: Self-pay | Admitting: Internal Medicine

## 2016-01-23 ENCOUNTER — Telehealth: Payer: Self-pay | Admitting: Pulmonary Disease

## 2016-01-23 MED ORDER — UMECLIDINIUM-VILANTEROL 62.5-25 MCG/INH IN AEPB: 1.0000 | INHALATION_SPRAY | Freq: Every day | RESPIRATORY_TRACT | Status: DC

## 2016-01-23 NOTE — Telephone Encounter (Signed)
Wrong dr.Stanley A Dalton ° °

## 2016-01-23 NOTE — Telephone Encounter (Signed)
Spoke with pt. She needs refill on Anoro. This has been sent in. Nothing further was needed.

## 2016-01-24 ENCOUNTER — Ambulatory Visit (INDEPENDENT_AMBULATORY_CARE_PROVIDER_SITE_OTHER): Payer: Managed Care, Other (non HMO) | Admitting: Internal Medicine

## 2016-01-24 ENCOUNTER — Encounter: Payer: Self-pay | Admitting: Internal Medicine

## 2016-01-24 ENCOUNTER — Telehealth: Payer: Self-pay | Admitting: Internal Medicine

## 2016-01-24 VITALS — BP 150/80 | HR 117 | Ht 63.0 in | Wt 266.0 lb

## 2016-01-24 DIAGNOSIS — J432 Centrilobular emphysema: Secondary | ICD-10-CM | POA: Diagnosis not present

## 2016-01-24 DIAGNOSIS — Z7689 Persons encountering health services in other specified circumstances: Secondary | ICD-10-CM

## 2016-01-24 MED ORDER — AZITHROMYCIN 250 MG PO TABS: ORAL_TABLET | ORAL | Status: DC

## 2016-01-24 NOTE — Patient Instructions (Signed)
-  Start taking azithromycin 250 mg 1 tablet every Monday, Wednesday, Friday. (Azithromycin 250 mg tablet 13 tablets, 11 refills).

## 2016-01-24 NOTE — Telephone Encounter (Signed)
Disability papers filled out.  please let her know that i did NOT state the she is totally disabled from all work, just from hospital work.   The charge for the form is $50   Her diabetes was uncontrolled at last check in march,  She needs to follow up with me for medication management.

## 2016-01-24 NOTE — Progress Notes (Signed)
[facility]* [facility]     Assessment and Plan:  Severe emphysema, with COPD exacerbation. -Continue Anoro, DuoNeb's. -Has recently completed a course of prednisone, we'll try to avoid further use of prednisone. She is started on azithromycin 250 mg to be taken 3 days weekly. -If she should continue to have exacerbations, we could continue starting Daliresp.  Chronic respiratory failure. -Patient has hypoxemic respiratory failure, chronic. She continues to be on liters of oxygen, ambulatory, pulse dosing. She is looking into buying a portable concentrator.  Obstructive sleep apnea. -Continue use of CPAP  Date: 01/24/2016  MRN# [id number] Catherine SquiresDeborah Alvillar 1956-03-14   Catherine SquiresDeborah Gerwig is a 60 y.o. old female seen in follow up for chief complaint of  Chief Complaint  Patient presents with  . Follow-up    BQ pt. pt c/o occ prod cough clear in color, occ wheezing, cp/tightness, sweats, runny nose,headacheX1d     HPI:   The patient is a 60 year old female with a history of severe emphysema. She recently had exacerbation, she required a prolonged course of prednisone, she also received a course of doxycycline and Levaquin. In addition, she also received a course of Tamiflu. She finished her course of prednisone 3 days ago, she noticed last night she started to feel somewhat more short of breath again with increased expectoration. She denies fever, or worsening breathing.  Medication:   Outpatient Encounter Prescriptions as of 01/24/2016  Medication Sig  . ACCU-CHEK SMARTVIEW test strip TEST BLOOD SUGAR FOUR TIMES DAILY  . ALPRAZolam (XANAX) 0.25 MG tablet TAKE 1 TABLET BY MOUTH EVERY NIGHT AT BEDTIME AS NEEDED  . aspirin 81 MG EC tablet Take 81 mg by mouth daily.    Marland Kitchen. atorvastatin (LIPITOR) 80 MG tablet Take 1 tablet (80 mg total) by mouth daily.  . butalbital-acetaminophen-caffeine (FIORICET, ESGIC) 50-325-40 MG per tablet TAKE 1 TABLET BY MOUTH EVERY 4 HOURS AS NEEDED  FOR HEADACHE  . chlorpheniramine-HYDROcodone (TUSSIONEX PENNKINETIC ER) 10-8 MG/5ML SUER Take 5 mLs by mouth 2 (two) times daily.  Marland Kitchen. CINNAMON PO Take 1,000 mg by mouth 2 (two) times daily.  Marland Kitchen. docusate sodium (COLACE) 100 MG capsule Take 200 mg by mouth daily. Reported on 09/20/2015  . furosemide (LASIX) 40 MG tablet TAKE 1 TABLET BY MOUTH EVERY DAY AS NEEDED FOR LEG SWELLING AND TO MAINTAIN WEIGHT.  Marland Kitchen. glipiZIDE (GLUCOTROL) 5 MG tablet Take 5 mg by mouth daily before breakfast.  . Insulin Detemir (LEVEMIR) 100 UNIT/ML Pen Inject 50 Units into the skin daily at 10 pm.  . Insulin Pen Needle 29G X 12.7MM MISC Use as directed  . Insulin Syringe-Needle U-100 (INSULIN SYRINGE .5CC/31GX5/16") 31G X 5/16" 0.5 ML MISC Test twice a day  . ipratropium-albuterol (DUONEB) 0.5-2.5 (3) MG/3ML SOLN USE 1 VIAL VIA NEBULIZER EVERY 6 HOURS AS NEEDED  . latanoprost (XALATAN) 0.005 % ophthalmic solution Place 1 drop into both eyes at bedtime.  . Magnesium 250 MG TABS Take 250 mg by mouth daily.  Marland Kitchen. NOVOLOG FLEXPEN 100 UNIT/ML FlexPen INJECT 10 UNITS            SUBCUTANEOUSLY 3 TIMES A   DAY BEFORE MEALS. OFFICE   APPOINTMENT NEEDED !  Marland Kitchen. PARoxetine (PAXIL) 10 MG tablet TAKE 1 TABLET(10 MG) BY MOUTH EVERY MORNING  . Potassium 99 MG TABS Take 99 mg by mouth once. Reported on 09/20/2015  . PROAIR HFA 108 (90 Base) MCG/ACT inhaler USE 2 INHALATIONS ORALLY   EVERY 6 HOURS AS NEEDED FORWHEEZING  . PROAIR  HFA 108 (90 Base) MCG/ACT inhaler USE 2 INHALATIONS ORALLY   EVERY 6 HOURS AS NEEDED FORWHEEZING  . umeclidinium-vilanterol (ANORO ELLIPTA) 62.5-25 MCG/INH AEPB Inhale 1 puff into the lungs daily.  . [DISCONTINUED] levofloxacin (LEVAQUIN) 500 MG tablet Take 1 tablet (500 mg total) by mouth daily. (Patient not taking: Reported on 01/24/2016)  . [DISCONTINUED] oseltamivir (TAMIFLU) 75 MG capsule Take 1 capsule (75 mg total) by mouth every 12 (twelve) hours. (Patient not taking: Reported on 01/24/2016)  . [DISCONTINUED] predniSONE  (STERAPRED UNI-PAK 21 TAB) 10 MG (21) TBPK tablet Take 6 tabs by mouth daily  for 3 days, then reduce by 1 tablet every 2 days until gone (Patient not taking: Reported on 01/24/2016)   No facility-administered encounter medications on file as of 01/24/2016.     Allergies:  Ibuprofen; Oxycodone-acetaminophen; Percocet; Sulfa antibiotics; Vicodin; and Aspirin  Review of Systems: Gen:  Denies  fever, sweats. HEENT: Denies blurred vision. Cvc:  No dizziness, chest pain or heaviness Resp:   Denies cough or sputum porduction. Gi: Denies swallowing difficulty, stomach pain.  Gu:  Denies bladder incontinence, burning urine Ext:   No Joint pain, stiffness. Skin: No skin rash, easy bruising. Endoc:  No polyuria, polydipsia. Psych: No depression, insomnia. Other:  All other systems were reviewed and found to be negative other than what is mentioned in the HPI.   Physical Examination:   VS: BP 150/80 mmHg  Pulse 117  Ht 5\' 3"  (1.6 m)  Wt 266 lb (120.657 kg)  BMI 47.13 kg/m2  SpO2 91%  General Appearance: No distress  Neuro:without focal findings,  speech normal,  HEENT: PERRLA, EOM intact. Pulmonary: normal breath sounds, No wheezing.   CardiovascularNormal S1,S2.  No m/r/g.   Abdomen: Benign, Soft, non-tender. Renal:  No costovertebral tenderness  GU:  Not performed at this time. Endoc: No evident thyromegaly, no signs of acromegaly. Skin:   warm, no rash. Extremities: normal, no cyanosis, clubbing.   LABORATORY PANEL:   CBC No results for input(s): WBC, HGB, HCT, PLT in the last 168 hours. ------------------------------------------------------------------------------------------------------------------  Chemistries  No results for input(s): NA, K, CL, CO2, GLUCOSE, BUN, CREATININE, CALCIUM, MG, AST, ALT, ALKPHOS, BILITOT in the last 168 hours.  Invalid input(s):  GFRCGP ------------------------------------------------------------------------------------------------------------------  Cardiac Enzymes No results for input(s): TROPONINI in the last 168 hours. ------------------------------------------------------------  RADIOLOGY:   No results found for this or any previous visit. Results for orders placed during the hospital encounter of 01/02/16  DG Chest 2 View   Narrative CLINICAL DATA:  Positive flu test 3 days ago, cough, chest congestion, shortness of breath, and fever since then, history of asthma-COPD, coronary artery disease, former smoker,  EXAM: CHEST  2 VIEW  COMPARISON:  Chest x-ray of September 04, 2015  FINDINGS: The lungs are well-expanded. The interstitial markings are increased bilaterally. There is no alveolar infiltrate. There is no pleural effusion. The heart and pulmonary vascularity are normal. The bony thorax exhibits no acute abnormality.  IMPRESSION: Increased interstitial markings bilaterally over baseline likely reflects acute bronchitic changes. Mild chronic hyperinflation consistent with known asthma-COPD. There is no alveolar pneumonia nor CHF.   Electronically Signed   By: David  [place] M.D.   On: 01/02/2016 16:05    ------------------------------------------------------------------------------------------------------------------  Thank  you for allowing [facility] to assist in the care of your patient. Our recommendations are noted above.  Please contact us if we can be of further service.   Wells Guiles, MD.  [facility] Office Number: [phone]  Santiago Glad, M.D.  Stephanie Acre, M.D.  Billy Fischer, M.D  01/24/2016

## 2016-01-24 NOTE — Telephone Encounter (Signed)
Pt notified that forms have been completed & will pick them up. Placed in folder up front & copy sent to scan and a copy was given to [place]. I also placed a sticky note inside of patient envelope to let her know the information documented below.

## 2016-01-29 ENCOUNTER — Encounter: Payer: Self-pay | Admitting: Internal Medicine

## 2016-01-29 ENCOUNTER — Ambulatory Visit (INDEPENDENT_AMBULATORY_CARE_PROVIDER_SITE_OTHER): Payer: Managed Care, Other (non HMO) | Admitting: Internal Medicine

## 2016-01-29 VITALS — BP 158/84 | HR 105 | Temp 98.2°F | Resp 14 | Ht 63.0 in | Wt 268.8 lb

## 2016-01-29 DIAGNOSIS — Z794 Long term (current) use of insulin: Secondary | ICD-10-CM

## 2016-01-29 DIAGNOSIS — IMO0002 Reserved for concepts with insufficient information to code with codable children: Secondary | ICD-10-CM

## 2016-01-29 DIAGNOSIS — E118 Type 2 diabetes mellitus with unspecified complications: Secondary | ICD-10-CM

## 2016-01-29 DIAGNOSIS — J9611 Chronic respiratory failure with hypoxia: Secondary | ICD-10-CM | POA: Diagnosis not present

## 2016-01-29 DIAGNOSIS — E1165 Type 2 diabetes mellitus with hyperglycemia: Secondary | ICD-10-CM

## 2016-01-29 MED ORDER — BUTALBITAL-APAP-CAFFEINE 50-325-40 MG PO TABS: ORAL_TABLET | ORAL | Status: DC

## 2016-01-29 MED ORDER — FAMOTIDINE 20 MG PO TABS: 20.0000 mg | ORAL_TABLET | Freq: Two times a day (BID) | ORAL | Status: DC

## 2016-01-29 NOTE — Progress Notes (Signed)
Subjective:  Patient ID: Catherine [name], female    DOB: 1956-04-20  Age: 60 y.o. MRN: [id number]  CC: The primary encounter diagnosis was Uncontrolled type 2 diabetes mellitus with complication, with long-term current use of insulin (HCC). A diagnosis of Chronic hypoxemic respiratory failure (HCC) was also pertinent to this visit.  HPI Catherine [name] presents for follow up on chronic conditions including COPD with chronic hypoxic respiratory failure ,and Type 2 Diabetes Mellitus, insulin requiring.  Has had recurrent COPD exacerbations over the past 5 months,  Without hospitalizaiton l  Treated by Dr Kendrick Fries. Has not been able to walk down the hallway to the bathroom without stopping to rest.     Currently on suppressive treatment with azithromycin  to prevent   Deterioration.    Type 2 DM:  Despite last visit's instructions.   She is taking Levemir 40 units glipizide  And insulin using a sliding scale  starting with 12 unidts of  novolog    Outpatient Prescriptions Prior to Visit  Medication Sig Dispense Refill  . ACCU-CHEK SMARTVIEW test strip TEST BLOOD SUGAR FOUR TIMES DAILY 100 each 12  . ALPRAZolam (XANAX) 0.25 MG tablet TAKE 1 TABLET BY MOUTH EVERY NIGHT AT BEDTIME AS NEEDED 30 tablet 0  . aspirin 81 MG EC tablet Take 81 mg by mouth daily.      Marland Kitchen atorvastatin (LIPITOR) 80 MG tablet Take 1 tablet (80 mg total) by mouth daily. 90 tablet 1  . azithromycin (ZITHROMAX) 250 MG tablet Take 1 tablet every Monday, Wednesday & Friday 13 tablet 11  . chlorpheniramine-HYDROcodone (TUSSIONEX PENNKINETIC ER) 10-8 MG/5ML SUER Take 5 mLs by mouth 2 (two) times daily. 115 mL 0  . CINNAMON PO Take 1,000 mg by mouth 2 (two) times daily.    Marland Kitchen docusate sodium (COLACE) 100 MG capsule Take 200 mg by mouth daily. Reported on 09/20/2015    . furosemide (LASIX) 40 MG tablet TAKE 1 TABLET BY MOUTH EVERY DAY AS NEEDED FOR LEG SWELLING AND TO MAINTAIN WEIGHT. 30 tablet 5  . glipiZIDE (GLUCOTROL) 5 MG  tablet Take 5 mg by mouth daily before breakfast.    . Insulin Detemir (LEVEMIR) 100 UNIT/ML Pen Inject 50 Units into the skin daily at 10 pm. 45 mL 3  . Insulin Pen Needle 29G X 12.7MM MISC Use as directed 200 each 1  . Insulin Syringe-Needle U-100 (INSULIN SYRINGE .5CC/31GX5/16") 31G X 5/16" 0.5 ML MISC Test twice a day 100 each 11  . ipratropium-albuterol (DUONEB) 0.5-2.5 (3) MG/3ML SOLN USE 1 VIAL VIA NEBULIZER EVERY 6 HOURS AS NEEDED 1080 mL 3  . latanoprost (XALATAN) 0.005 % ophthalmic solution Place 1 drop into both eyes at bedtime.    . Magnesium 250 MG TABS Take 250 mg by mouth daily.    Marland Kitchen NOVOLOG FLEXPEN 100 UNIT/ML FlexPen INJECT 10 UNITS            SUBCUTANEOUSLY 3 TIMES A   DAY BEFORE MEALS. OFFICE   APPOINTMENT NEEDED ! 90 mL 2  . PARoxetine (PAXIL) 10 MG tablet TAKE 1 TABLET(10 MG) BY MOUTH EVERY MORNING 90 tablet 0  . Potassium 99 MG TABS Take 99 mg by mouth once. Reported on 09/20/2015    . PROAIR HFA 108 (90 Base) MCG/ACT inhaler USE 2 INHALATIONS ORALLY   EVERY 6 HOURS AS NEEDED FORWHEEZING 17 g 1  . PROAIR HFA 108 (90 Base) MCG/ACT inhaler USE 2 INHALATIONS ORALLY   EVERY 6 HOURS AS NEEDED FORWHEEZING 17 g 1  .  umeclidinium-vilanterol (ANORO ELLIPTA) 62.5-25 MCG/INH AEPB Inhale 1 puff into the lungs daily. 90 each 3  . butalbital-acetaminophen-caffeine (FIORICET, ESGIC) 50-325-40 MG per tablet TAKE 1 TABLET BY MOUTH EVERY 4 HOURS AS NEEDED FOR HEADACHE 30 tablet 3   No facility-administered medications prior to visit.    Review of Systems;  Patient denies headache, fevers, malaise, unintentional weight loss, skin rash, eye pain, sinus congestion and sinus pain, sore throat, dysphagia,  hemoptysis , cough, dyspnea, wheezing, chest pain, palpitations, orthopnea, edema, abdominal pain, nausea, melena, diarrhea, constipation, flank pain, dysuria, hematuria, urinary  Frequency, nocturia, numbness, tingling, seizures,  Focal weakness, Loss of consciousness,  Tremor, insomnia,  depression, anxiety, and suicidal ideation.      Objective:  BP 158/84 mmHg  Pulse 105  Temp(Src) 98.2 F (36.8 C) (Oral)  Resp 14  Ht 5\' 3"  (1.6 m)  Wt 268 lb 12 oz (121.904 kg)  BMI 47.62 kg/m2  SpO2 97%  BP Readings from Last 3 Encounters:  01/29/16 158/84  01/24/16 150/80  01/02/16 140/78    Wt Readings from Last 3 Encounters:  01/29/16 268 lb 12 oz (121.904 kg)  01/24/16 266 lb (120.657 kg)  01/02/16 267 lb (121.11 kg)    General appearance: alert, cooperative and appears stated age Ears: normal TM's and external ear canals both ears Throat: lips, mucosa, and tongue normal; teeth and gums normal Neck: no adenopathy, no carotid bruit, supple, symmetrical, trachea midline and thyroid not enlarged, symmetric, no tenderness/mass/nodules Back: symmetric, no curvature. ROM normal. No CVA tenderness. Lungs: clear to auscultation bilaterally Heart: regular rate and rhythm, S1, S2 normal, no murmur, click, rub or gallop Abdomen: soft, non-tender; bowel sounds normal; no masses,  no organomegaly Pulses: 2+ and symmetric Skin: Skin color, texture, turgor normal. No rashes or lesions Lymph nodes: Cervical, supraclavicular, and axillary nodes normal.  Lab Results  Component Value Date   HGBA1C 8.5* 11/19/2015   HGBA1C 8.4* 09/25/2014   HGBA1C 8.6* 10/07/2013    Lab Results  Component Value Date   CREATININE 0.64 11/19/2015   CREATININE 0.70 09/25/2014   CREATININE 0.66 10/07/2013    Lab Results  Component Value Date   WBC 10.9* 01/02/2016   HGB 13.3 01/02/2016   HCT 40.3 01/02/2016   PLT 334.0 01/02/2016   GLUCOSE 160* 11/19/2015   CHOL 233* 11/19/2015   TRIG 317.0* 11/19/2015   HDL 37.90* 11/19/2015   LDLDIRECT 154.0 11/19/2015   LDLCALC 105* 09/25/2014   ALT 20 11/19/2015   AST 27 11/19/2015   NA 139 11/19/2015   K 3.9 11/19/2015   CL 103 11/19/2015   CREATININE 0.64 11/19/2015   BUN 10 11/19/2015   CO2 27 11/19/2015   TSH 0.628 01/04/2012   INR 0.9  07/22/2012   HGBA1C 8.5* 11/19/2015   MICROALBUR 1.0 11/21/2015    Dg Chest 2 View  01/02/2016  CLINICAL DATA:  Positive flu test 3 days ago, cough, chest congestion, shortness of breath, and fever since then, history of asthma-COPD, coronary artery disease, former smoker, EXAM: CHEST  2 VIEW COMPARISON:  Chest x-ray of September 04, 2015 FINDINGS: The lungs are well-expanded. The interstitial markings are increased bilaterally. There is no alveolar infiltrate. There is no pleural effusion. The heart and pulmonary vascularity are normal. The bony thorax exhibits no acute abnormality. IMPRESSION: Increased interstitial markings bilaterally over baseline likely reflects acute bronchitic changes. Mild chronic hyperinflation consistent with known asthma-COPD. There is no alveolar pneumonia nor CHF. Electronically Signed   By: David  [place]azil[place]  M.D.   On: 01/02/2016 16:05    Assessment & Plan:   Problem List Items Addressed This Visit    Diabetes mellitus type 2, uncontrolled, with complications (HCC) - Primary    No changes to regime today due to labile BS       Chronic hypoxemic respiratory failure (HCC)    She remains dyspneic with minimal exertion and has had 4 or 5 exacerbations since January .  She is not able tot return to any form of work due to severe lung disease.          I have changed Ms. Braunschweig's butalbital-acetaminophen-caffeine. I am also having her start on famotidine. Additionally, I am having her maintain her aspirin, docusate sodium, INSULIN SYRINGE .5CC/31GX5/16", ACCU-CHEK SMARTVIEW, furosemide, latanoprost, ALPRAZolam, PARoxetine, Insulin Pen Needle, Potassium, PROAIR HFA, CINNAMON PO, Magnesium, Insulin Detemir, ipratropium-albuterol, atorvastatin, PROAIR HFA, glipiZIDE, chlorpheniramine-HYDROcodone, NOVOLOG FLEXPEN, umeclidinium-vilanterol, and azithromycin.  Meds ordered this encounter  Medications  . famotidine (PEPCID) 20 MG tablet    Sig: Take 1 tablet (20 mg total)  by mouth 2 (two) times daily.    Dispense:  60 tablet    Refill:  2  . butalbital-acetaminophen-caffeine (FIORICET, ESGIC) 50-325-40 MG tablet    Sig: TAKE 1 TABLET BY MOUTH EVERY 4 HOURS AS NEEDED FOR HEADACHE    Dispense:  30 tablet    Refill:  3    Medications Discontinued During This Encounter  Medication Reason  . butalbital-acetaminophen-caffeine (FIORICET, ESGIC) 50-325-40 MG per tablet Reorder    Follow-up: No Follow-up on file.   Sherlene Shams, MD

## 2016-01-29 NOTE — Assessment & Plan Note (Signed)
No changes to regime today due to labile BS

## 2016-01-29 NOTE — Assessment & Plan Note (Signed)
She remains dyspneic with minimal exertion and has had 4 or 5 exacerbations since January .  She is not able tot return to any form of work due to severe lung disease.

## 2016-01-29 NOTE — Progress Notes (Signed)
Pre-visit discussion using our clinic review tool. No additional management support is needed unless otherwise documented below in the visit note.  

## 2016-01-29 NOTE — Patient Instructions (Signed)
Return for Fasting labs on or after February 20 2016  Continue your current regimen of Levemir, Novolog and glipizide  Please consider taking a probiotic ( Align, Floraque or MattelCulturelle), the generic version of one of these over the counter medications, or an alternative form (kombucha,  Yogurt, or another dietary source) for a minimum of 3 weeks to prevent a serious antibiotic associated diarrhea  Called clostridium dificile colitis.  Taking a probiotic may also prevent vaginitis due to yeast infections and can be continued indefinitely if you feel that it improves your digestion or your elimination (bowels).     We discussed discontinuing your Protonix because  of the recently published studies suggesting an association with increased risk of dementia and kidney failure.  I  Would like you to try using  famotidine 20 mg  twice daily, and I sent  an rx to your pharmacy.    if your reflux symptoms are controlled,  You can Continue the daily h2 blocker. If not,  We will resume a the protonix

## 2016-01-30 DIAGNOSIS — H40013 Open angle with borderline findings, low risk, bilateral: Secondary | ICD-10-CM | POA: Diagnosis not present

## 2016-01-30 LAB — HM DIABETES EYE EXAM

## 2016-02-22 ENCOUNTER — Other Ambulatory Visit: Payer: Self-pay | Admitting: Internal Medicine

## 2016-03-14 NOTE — Telephone Encounter (Signed)
error 

## 2016-03-21 ENCOUNTER — Other Ambulatory Visit: Payer: Self-pay

## 2016-03-21 MED ORDER — UMECLIDINIUM-VILANTEROL 62.5-25 MCG/INH IN AEPB: 1.0000 | INHALATION_SPRAY | Freq: Every day | RESPIRATORY_TRACT | Status: DC

## 2016-04-08 ENCOUNTER — Encounter: Payer: Self-pay | Admitting: Emergency Medicine

## 2016-04-08 ENCOUNTER — Ambulatory Visit
Admission: EM | Admit: 2016-04-08 | Discharge: 2016-04-08 | Disposition: A | Discharge status: Home / Self Care | Payer: Managed Care, Other (non HMO) | Attending: Family Medicine | Admitting: Family Medicine

## 2016-04-08 DIAGNOSIS — R0902 Hypoxemia: Secondary | ICD-10-CM | POA: Diagnosis not present

## 2016-04-08 DIAGNOSIS — R0602 Shortness of breath: Secondary | ICD-10-CM | POA: Diagnosis not present

## 2016-04-08 DIAGNOSIS — J441 Chronic obstructive pulmonary disease with (acute) exacerbation: Secondary | ICD-10-CM

## 2016-04-08 MED ORDER — IPRATROPIUM-ALBUTEROL 0.5-2.5 (3) MG/3ML IN SOLN
3.0000 mL | Freq: Once | RESPIRATORY_TRACT | Status: AC
  Administered 2016-04-08: 3 mL via RESPIRATORY_TRACT

## 2016-04-08 MED ORDER — HYDROCOD POLST-CPM POLST ER 10-8 MG/5ML PO SUER: 5.0000 mL | Freq: Two times a day (BID) | ORAL | 0 refills | Status: DC | PRN

## 2016-04-08 MED ORDER — PREDNISONE 10 MG (21) PO TBPK: ORAL_TABLET | ORAL | 0 refills | Status: DC

## 2016-04-08 MED ORDER — LEVOFLOXACIN 500 MG PO TABS: 500.0000 mg | ORAL_TABLET | Freq: Every day | ORAL | 0 refills | Status: DC

## 2016-04-08 MED ORDER — METHYLPREDNISOLONE SODIUM SUCC 125 MG IJ SOLR
125.0000 mg | Freq: Once | INTRAMUSCULAR | Status: AC
  Administered 2016-04-08: 125 mg via INTRAMUSCULAR

## 2016-04-08 NOTE — ED Triage Notes (Signed)
Patient states she started feeling bad and having difficulty breathing on Friday, was trying to wait to see PCP but began running a fever today

## 2016-04-08 NOTE — ED Provider Notes (Signed)
[facility]    CSN: [id number] Arrival date & time: 04/08/16  1840  First Provider Contact:  First MD Initiated Contact with Patient 04/08/16 1846         History   Chief Complaint Chief Complaint  Patient presents with  . Shortness of Breath    HPI Catherine [name] is a 60 y.o. female.   Patient's here for shortness of breath. She has a history of COPD obesity and recurrent exacerbation of COPD. She states Friday she started getting short of breath cough and greenish sputum. She is on Zithromax 3 times a week to reduce infections but didn't work this time. She states started contact her PCP today but was able to get into the office to be seen.  Past history is positive for depression diabetes asthma and anxiety and GERD. She also has history hyperlipidemia hypertension she's had cardiac catheterization before in the past. She smoked but stopped in 2002. She has a sister who is 4 years younger than her who was recently diagnosed with stage IV lung cancer   The history is provided by the patient. No language interpreter was used.  Shortness of Breath  Severity:  Severe Onset quality:  Sudden Duration:  5 days Timing:  Constant Progression:  Worsening Chronicity:  Recurrent Context: URI   Context: not activity, not animal exposure, not emotional upset, not known allergens, not occupational exposure, not pollens and not strong odors   Relieved by:  Nothing Ineffective treatments:  None tried Associated symptoms: cough, diaphoresis, sputum production and wheezing   Associated symptoms: no chest pain, no fever and no sore throat   Risk factors: obesity and tobacco use   Risk factors: no recent alcohol use, no family hx of DVT and no hx of cancer     Past Medical History:  Diagnosis Date  . Anxiety   . Asthma   . COPD (chronic obstructive pulmonary disease) (HCC)   . Depression   . Diabetes mellitus   . Gastritis and duodenitis June 2011   EGD  . GERD  (gastroesophageal reflux disease)   . Hyperglycemia   . Hyperlipidemia   . Hypertension   . Leukocytosis   . Obesity (BMI 30-39.9)   . S/P cardiac catheterization March 2012   normal coronaries,  due to chest pain ([place])  . Screening for breast cancer Jul 28 2011   normal  . Screening for colon cancer June 2011   polyps found, next one due 2014 ([place])  . Tobacco abuse, in remission    quit oct during hospitalization     Patient Active Problem List   Diagnosis Date Noted  . Viral respiratory illness 04/21/2015  . Tachycardia 11/21/2014  . Encounter for preventive health examination 09/26/2014  . Neck pain of over 3 months duration 09/26/2014  . Inflammatory arthritis (HCC) 06/13/2014  . Elevated alkaline phosphatase measurement 10/09/2013  . Hand swelling 06/15/2013  . Leg swelling 04/05/2013  . OSA (obstructive sleep apnea) 03/01/2013  . Allergic rhinitis 02/04/2013  . Pulmonary nodule 09/15/2012  . COPD exacerbation (HCC) 05/28/2012  . Chronic hypoxemic respiratory failure (HCC) 05/03/2012  . COPD (chronic obstructive pulmonary disease) (HCC) 04/28/2012  . Diabetes mellitus type 2, uncontrolled, with complications (HCC) 11/07/2011  . GERD (gastroesophageal reflux disease)   . Obesity (BMI 30-39.9)   . Depression   . Anxiety   . Tobacco abuse, in remission   . Screening for colon cancer   . Gastritis and duodenitis   . Screening for  breast cancer   . CAD (coronary artery disease) 12/02/2010  . HTN (hypertension) 12/02/2010  . Hyperlipidemia 12/02/2010    Past Surgical History:  Procedure Laterality Date  . ABDOMINAL HYSTERECTOMY    . ABDOMINAL HYSTERECTOMY    . BILATERAL OOPHORECTOMY  1995   and TAH.  noncancerous reasons  . CARDIAC CATHETERIZATION  11/22/2010   no significant disease  . HEMORRHOID SURGERY      OB History    No data available       Home Medications    Prior to Admission medications   Medication Sig Start Date End Date Taking?  Authorizing Provider  ACCU-CHEK SMARTVIEW test strip TEST BLOOD SUGAR FOUR TIMES DAILY 11/04/12   Sherlene Shams, MD  ALPRAZolam Prudy Feeler) 0.25 MG tablet TAKE 1 TABLET BY MOUTH EVERY NIGHT AT BEDTIME AS NEEDED 10/17/14   Sherlene Shams, MD  aspirin 81 MG EC tablet Take 81 mg by mouth daily.      Historical Provider, MD  atorvastatin (LIPITOR) 80 MG tablet Take 1 tablet (80 mg total) by mouth daily. 11/21/15   Sherlene Shams, MD  azithromycin (ZITHROMAX) 250 MG tablet Take 1 tablet every Monday, Wednesday & Friday 01/24/16   Shane Crutch, MD  butalbital-acetaminophen-caffeine (FIORICET, ESGIC) 50-325-40 MG tablet TAKE 1 TABLET BY MOUTH EVERY 4 HOURS AS NEEDED FOR HEADACHE 01/29/16   Sherlene Shams, MD  chlorpheniramine-HYDROcodone [facility] PENNKINETIC ER) 10-8 MG/5ML SUER Take 5 mLs by mouth 2 (two) times daily. 12/13/15   Lutricia Feil, PA-C  chlorpheniramine-HYDROcodone (TUSSIONEX PENNKINETIC ER) 10-8 MG/5ML SUER Take 5 mLs by mouth every 12 (twelve) hours as needed for cough. 04/08/16   Hassan Rowan, MD  CINNAMON PO Take 1,000 mg by mouth 2 (two) times daily.    Historical Provider, MD  docusate sodium (COLACE) 100 MG capsule Take 200 mg by mouth daily. Reported on 09/20/2015    Historical Provider, MD  famotidine (PEPCID) 20 MG tablet Take 1 tablet (20 mg total) by mouth 2 (two) times daily. 01/29/16   Sherlene Shams, MD  famotidine (PEPCID) 20 MG tablet TAKE 1 TABLET (20 MG TOTAL) BY MOUTH 2 (TWO) TIMES DAILY. 02/22/16   Sherlene Shams, MD  furosemide (LASIX) 40 MG tablet TAKE 1 TABLET BY MOUTH EVERY DAY AS NEEDED FOR LEG SWELLING AND TO MAINTAIN WEIGHT. 11/14/13   Sherlene Shams, MD  glipiZIDE (GLUCOTROL) 5 MG tablet Take 5 mg by mouth daily before breakfast.    Historical Provider, MD  Insulin Detemir (LEVEMIR) 100 UNIT/ML Pen Inject 50 Units into the skin daily at 10 pm. 11/19/15   Sherlene Shams, MD  Insulin Pen Needle 29G X 12.7MM MISC Use as directed 03/16/15   Sherlene Shams, MD  Insulin  Syringe-Needle U-100 (INSULIN SYRINGE .5CC/31GX5/16") 31G X 5/16" 0.5 ML MISC Test twice a day 09/15/12   Sherlene Shams, MD  ipratropium-albuterol (DUONEB) 0.5-2.5 (3) MG/3ML SOLN USE 1 VIAL VIA NEBULIZER EVERY 6 HOURS AS NEEDED 11/19/15   Sherlene Shams, MD  latanoprost (XALATAN) 0.005 % ophthalmic solution Place 1 drop into both eyes at bedtime.    Historical Provider, MD  levofloxacin (LEVAQUIN) 500 MG tablet Take 1 tablet (500 mg total) by mouth daily. 04/08/16   Hassan Rowan, MD  Magnesium 250 MG TABS Take 250 mg by mouth daily.    Historical Provider, MD  NOVOLOG FLEXPEN 100 UNIT/ML FlexPen INJECT 10 UNITS            SUBCUTANEOUSLY  3 TIMES A   DAY BEFORE MEALS. OFFICE   APPOINTMENT NEEDED ! 01/08/16   Sherlene Shams, MD  PARoxetine (PAXIL) 10 MG tablet TAKE 1 TABLET(10 MG) BY MOUTH EVERY MORNING 03/16/15   Sherlene Shams, MD  Potassium 99 MG TABS Take 99 mg by mouth once. Reported on 09/20/2015    Historical Provider, MD  predniSONE (STERAPRED UNI-PAK 21 TAB) 10 MG (21) TBPK tablet Sig 6 tablet day 1, 5 tablets day 2, 4 tablets day 3,,3tablets day 4, 2 tablets day 5, 1 tablet day 6 take all tablets orally 04/08/16   Hassan Rowan, MD  PROAIR HFA 108 [phone] Base) MCG/ACT inhaler USE 2 INHALATIONS ORALLY   EVERY 6 HOURS AS NEEDED Judeth Cornfield 09/19/15   Sherlene Shams, MD  PROAIR HFA 108 [phone] Base) MCG/ACT inhaler USE 2 INHALATIONS ORALLY   EVERY 6 HOURS AS NEEDED Judeth Cornfield 12/14/15   Sherlene Shams, MD  umeclidinium-vilanterol (ANORO ELLIPTA) 62.5-25 MCG/INH AEPB Inhale 1 puff into the lungs daily. 03/21/16   Shane Crutch, MD    Family History Family History  Problem Relation Age of Onset  . Coronary artery disease Mother 53  . Heart disease Mother     CABG x5  . COPD Mother 72  . Breast cancer Mother   . Kidney disease Mother   . Heart failure Father   . Angina Father   . Diabetes Father   . Depression Father   . Asthma Brother   . Asthma Brother     Social History Social History  Substance  Use Topics  . Smoking status: Former Smoker    Packs/day: 1.00    Years: 40.00    Types: Cigarettes    Quit date: 06/27/2011  . Smokeless tobacco: Never Used  . Alcohol use No     Allergies   Ibuprofen; Oxycodone-acetaminophen; Percocet [oxycodone-acetaminophen]; Sulfa antibiotics; Vicodin [hydrocodone-acetaminophen]; and Aspirin   Review of Systems Review of Systems  Constitutional: Positive for diaphoresis. Negative for fever.  HENT: Negative for sore throat.   Respiratory: Positive for cough, sputum production, shortness of breath and wheezing.   Cardiovascular: Negative for chest pain.  All other systems reviewed and are negative.    Physical Exam Triage Vital Signs ED Triage Vitals  Enc Vitals Group     BP 04/08/16 1844 (!) 154/80     Pulse Rate 04/08/16 1844 (!) 101     Resp 04/08/16 1844 20     Temp 04/08/16 1844 98 F (36.7 C)     Temp Source 04/08/16 1844 Oral     SpO2 04/08/16 1844 98 %     Weight --      Height --      Head Circumference --      Peak Flow --      Pain Score 04/08/16 1845 7     Pain Loc --      Pain Edu? --      Excl. in GC? --    No data found.   Updated Vital Signs BP (!) 154/80   Pulse 98   Temp 98 F (36.7 C) (Oral)   Resp 20   SpO2 98%   Visual Acuity Right Eye Distance:   Left Eye Distance:   Bilateral Distance:    Right Eye Near:   Left Eye Near:    Bilateral Near:     Physical Exam  Constitutional: She is oriented to person, place, and time. She appears well-developed. She appears distressed.  HENT:  Head: Normocephalic.  Right Ear: External ear normal.  Left Ear: External ear normal.  Mouth/Throat: Oropharynx is clear and moist.  Eyes: Pupils are equal, round, and reactive to light.  Neck: Normal range of motion. No tracheal deviation present. No thyromegaly present.  Cardiovascular: Tachycardia present.  Exam reveals distant heart sounds.   Pulmonary/Chest: She has decreased breath sounds. She has wheezes.   Musculoskeletal: Normal range of motion.  Lymphadenopathy:    She has no cervical adenopathy.  Neurological: She is alert and oriented to person, place, and time.  Skin: Skin is warm. She is diaphoretic.  Psychiatric: Her mood appears anxious.  Vitals reviewed.    UC Treatments / Results  Labs (all labs ordered are listed, but only abnormal results are displayed) Labs Reviewed - No data to display  EKG  EKG Interpretation None       Radiology No results found.  Procedures Procedures (including critical care time)  Medications Ordered in UC Medications  methylPREDNISolone sodium succinate (SOLU-MEDROL) 125 mg/2 mL injection 125 mg (125 mg Intramuscular Given 04/08/16 1859)  ipratropium-albuterol (DUONEB) 0.5-2.5 (3) MG/3ML nebulizer solution 3 mL (3 mLs Nebulization Given 04/08/16 1900)     Initial Impression / Assessment and Plan / UC Course  I have reviewed the triage vital signs and the nursing notes.  Pertinent labs & imaging results that were available during my care of the patient were reviewed by me and considered in my medical decision making (see chart for details).  Clinical Course   Patient received Solu-Medrol IM and DuoNeb treatment. She reports improvement with her breathing. Discussed with her my concern about her going home and offered to transfer her to the ED for possible admission she declines. She wants a refill for Tussionex be started back on his prednisone for 6 days and she feels she needs another if antibiotics he was taking Zithromax prophylactically for her COPD. Will place on Levaquin she states that she normal takes Levaquin or Doxy which she has exacerbation and I did suggest that she follow-up with her PCP in 3 days.   Final Clinical Impressions(s) / UC Diagnoses   Final diagnoses:  COPD exacerbation (HCC)  Shortness of breath  Hypoxia    New Prescriptions New Prescriptions   CHLORPHENIRAMINE-HYDROCODONE (TUSSIONEX PENNKINETIC ER)  10-8 MG/5ML SUER    Take 5 mLs by mouth every 12 (twelve) hours as needed for cough.   LEVOFLOXACIN (LEVAQUIN) 500 MG TABLET    Take 1 tablet (500 mg total) by mouth daily.   PREDNISONE (STERAPRED UNI-PAK 21 TAB) 10 MG (21) TBPK TABLET    Sig 6 tablet day 1, 5 tablets day 2, 4 tablets day 3,,3tablets day 4, 2 tablets day 5, 1 tablet day 6 take all tablets orally     Hassan Rowan, MD 04/08/16 7035

## 2016-04-16 ENCOUNTER — Telehealth: Payer: Self-pay | Admitting: *Deleted

## 2016-04-16 NOTE — Telephone Encounter (Signed)
Please give a time and date for pt to have a urgent care follow up for COPD  She can do any day next week except in the morning  Tuesdays and Thursday .  Pt contact [phone]

## 2016-04-17 NOTE — Progress Notes (Signed)
[facility]     Assessment and Plan:  Severe emphysema, with COPD exacerbation and/or severe persistent asthma.  -Continue Anoro, DuoNeb's. -Has recently completed a course of prednisone, we'll try to avoid further use of prednisone. She is started on azithromycin 250 mg to be taken 3 days weekly. -If she should continue to have exacerbations, we could continue starting Daliresp. --Review of previous CBC shows eos have ranged from 0-4.4%.  --Will send for hi-res CT chest to look for evidence of ILD.  --Will send for full PFT.   Chronic respiratory failure. -Patient has hypoxemic respiratory failure, chronic. Continue oxygen, ambulatory, pulse dosing.   Obstructive sleep apnea. -intolerant of CPAP  BMI=47  Date: 04/17/2016  MRN# [id number] Catherine [name] [birth year]   Catherine SquiresDeborah Shrout is a 60 y.o. old female seen in follow up for chief complaint of  Chief Complaint  Patient presents with  . Follow-up    33mo rov. breathing has slightly worsen. c/o cont sob w/exertion, prod cough w/white mucus & wheezing w/deep breathing. urgent care visit 08/01 staeted abx & prednisone breathing has improved.      HPI:   The patient is a 60 year old female female with a history of severe emphysema. She has had multiple exacerbations and multiple course of prednisone.. At last visit she was asked to continue Anoro, duonebs, oxygen, Cpap, and started on azithro 3 days every M, W, F. Since her last visit she has had an exacerbation about 2 weeks, she saw her PCP yesterday and noted that she was not back to baseline, therefore started on a long taper.  She is using nebs 2 to 3 times per day. She is on Anoro once per day in the am.   Review of images from chest x-ray 01/02/16, CT chest from 01/10/16: Trace chronic bronchitis, no significant emphysematous changes seen. Interstitial changes seen on the most recent chest x-ray, however, these were not seen on the previous CT image  She is  no longer on CPAP due to intolerance.    Medication:   Outpatient Encounter Prescriptions as of 04/21/2016  Medication Sig  . ACCU-CHEK SMARTVIEW test strip TEST BLOOD SUGAR FOUR TIMES DAILY  . ALPRAZolam (XANAX) 0.25 MG tablet TAKE 1 TABLET BY MOUTH EVERY NIGHT AT BEDTIME AS NEEDED  . aspirin 81 MG EC tablet Take 81 mg by mouth daily.    Marland Kitchen. atorvastatin (LIPITOR) 80 MG tablet Take 1 tablet (80 mg total) by mouth daily.  Marland Kitchen. azithromycin (ZITHROMAX) 250 MG tablet Take 1 tablet every Monday, Wednesday & Friday  . butalbital-acetaminophen-caffeine (FIORICET, ESGIC) 50-325-40 MG tablet TAKE 1 TABLET BY MOUTH EVERY 4 HOURS AS NEEDED FOR HEADACHE  . chlorpheniramine-HYDROcodone (TUSSIONEX PENNKINETIC ER) 10-8 MG/5ML SUER Take 5 mLs by mouth 2 (two) times daily.  . chlorpheniramine-HYDROcodone (TUSSIONEX PENNKINETIC ER) 10-8 MG/5ML SUER Take 5 mLs by mouth every 12 (twelve) hours as needed for cough.  Marland Kitchen. CINNAMON PO Take 1,000 mg by mouth 2 (two) times daily.  Marland Kitchen. docusate sodium (COLACE) 100 MG capsule Take 200 mg by mouth daily. Reported on 09/20/2015  . famotidine (PEPCID) 20 MG tablet Take 1 tablet (20 mg total) by mouth 2 (two) times daily.  . famotidine (PEPCID) 20 MG tablet TAKE 1 TABLET (20 MG TOTAL) BY MOUTH 2 (TWO) TIMES DAILY.  . furosemide (LASIX) 40 MG tablet TAKE 1 TABLET BY MOUTH EVERY DAY AS NEEDED FOR LEG SWELLING AND TO MAINTAIN WEIGHT.  Marland Kitchen. glipiZIDE (GLUCOTROL) 5 MG tablet Take 5 mg by mouth  daily before breakfast.  . Insulin Detemir (LEVEMIR) 100 UNIT/ML Pen Inject 50 Units into the skin daily at 10 pm.  . Insulin Pen Needle 29G X 12.7MM MISC Use as directed  . Insulin Syringe-Needle U-100 (INSULIN SYRINGE .5CC/31GX5/16") 31G X 5/16" 0.5 ML MISC Test twice a day  . ipratropium-albuterol (DUONEB) 0.5-2.5 (3) MG/3ML SOLN USE 1 VIAL VIA NEBULIZER EVERY 6 HOURS AS NEEDED  . latanoprost (XALATAN) 0.005 % ophthalmic solution Place 1 drop into both eyes at bedtime.  Marland Kitchen levofloxacin (LEVAQUIN)  500 MG tablet Take 1 tablet (500 mg total) by mouth daily.  . Magnesium 250 MG TABS Take 250 mg by mouth daily.  Marland Kitchen NOVOLOG FLEXPEN 100 UNIT/ML FlexPen INJECT 10 UNITS            SUBCUTANEOUSLY 3 TIMES A   DAY BEFORE MEALS. OFFICE   APPOINTMENT NEEDED !  Marland Kitchen PARoxetine (PAXIL) 10 MG tablet TAKE 1 TABLET(10 MG) BY MOUTH EVERY MORNING  . Potassium 99 MG TABS Take 99 mg by mouth once. Reported on 09/20/2015  . predniSONE (STERAPRED UNI-PAK 21 TAB) 10 MG (21) TBPK tablet Sig 6 tablet day 1, 5 tablets day 2, 4 tablets day 3,,3tablets day 4, 2 tablets day 5, 1 tablet day 6 take all tablets orally  . PROAIR HFA 108 (90 Base) MCG/ACT inhaler USE 2 INHALATIONS ORALLY   EVERY 6 HOURS AS NEEDED FORWHEEZING  . PROAIR HFA 108 (90 Base) MCG/ACT inhaler USE 2 INHALATIONS ORALLY   EVERY 6 HOURS AS NEEDED FORWHEEZING  . umeclidinium-vilanterol (ANORO ELLIPTA) 62.5-25 MCG/INH AEPB Inhale 1 puff into the lungs daily.   No facility-administered encounter medications on file as of 04/21/2016.      Allergies:  Ibuprofen; Oxycodone-acetaminophen; Percocet [oxycodone-acetaminophen]; Sulfa antibiotics; Vicodin [hydrocodone-acetaminophen]; and Aspirin  Review of Systems: Gen:  Denies  fever, sweats. HEENT: Denies blurred vision. Cvc:  No dizziness, chest pain or heaviness Resp:   Denies cough or sputum porduction. Gi: Denies swallowing difficulty, stomach pain.  Gu:  Denies bladder incontinence, burning urine Ext:   No Joint pain, stiffness. Skin: No skin rash, easy bruising. Endoc:  No polyuria, polydipsia. Psych: No depression, insomnia. Other:  All other systems were reviewed and found to be negative other than what is mentioned in the HPI.   Physical Examination:   VS: BP 136/80 (BP Location: Left Arm, Cuff Size: Normal)   Pulse 98   Ht  (1.6 m)   Wt 267 lb (121.1 kg)   SpO2 90%   BMI 47.30 kg/m   General Appearance: No distress  Neuro:without focal findings,  speech normal,  HEENT: PERRLA, EOM  intact. Pulmonary: normal breath sounds, decreased air entry bilaterally, faint wheeze.    CardiovascularNormal S1,S2.  No m/r/g.   Abdomen: Benign, Soft, non-tender. Renal:  No costovertebral tenderness  GU:  Not performed at this time. Endoc: No evident thyromegaly, no signs of acromegaly. Skin:   warm, no rash. Extremities: normal, no cyanosis, clubbing.   LABORATORY PANEL:   CBC No results for input(s): WBC, HGB, HCT, PLT in the last 168 hours. ------------------------------------------------------------------------------------------------------------------  Chemistries  No results for input(s): NA, K, CL, CO2, GLUCOSE, BUN, CREATININE, CALCIUM, MG, AST, ALT, ALKPHOS, BILITOT in the last 168 hours.  Invalid input(s): GFRCGP ------------------------------------------------------------------------------------------------------------------  Cardiac Enzymes No results for input(s): TROPONINI in the last 168 hours. ------------------------------------------------------------  RADIOLOGY:   No results found for this or any previous visit. Results for orders placed during the hospital encounter of 01/02/16  DG Chest 2  View   Narrative CLINICAL DATA:  Positive flu test 3 days ago, cough, chest congestion, shortness of breath, and fever since then, history of asthma-COPD, coronary artery disease, former smoker,  EXAM: CHEST  2 VIEW  COMPARISON:  Chest x-ray of September 04, 2015  FINDINGS: The lungs are well-expanded. The interstitial markings are increased bilaterally. There is no alveolar infiltrate. There is no pleural effusion. The heart and pulmonary vascularity are normal. The bony thorax exhibits no acute abnormality.  IMPRESSION: Increased interstitial markings bilaterally over baseline likely reflects acute bronchitic changes. Mild chronic hyperinflation consistent with known asthma-COPD. There is no alveolar pneumonia nor CHF.   Electronically Signed   By:  David  [place] M.D.   On: 01/02/2016 16:05    ------------------------------------------------------------------------------------------------------------------  Thank  you for allowing [facility] to assist in the care of your patient. Our recommendations are noted above.  Please contact us if we can be of further service.   Wells Guiles, MD.   Pulmonary and Critical Care Office Number: [phone]  Santiago Glad, M.D.  Stephanie Acre, M.D.  Billy Fischer, M.D  04/17/2016

## 2016-04-17 NOTE — Telephone Encounter (Signed)
Scheduled pt OV on to see Dr Darrick Huntsmanullo Monday

## 2016-04-19 ENCOUNTER — Other Ambulatory Visit: Payer: Self-pay | Admitting: Internal Medicine

## 2016-04-21 ENCOUNTER — Ambulatory Visit (INDEPENDENT_AMBULATORY_CARE_PROVIDER_SITE_OTHER): Payer: Managed Care, Other (non HMO) | Admitting: Internal Medicine

## 2016-04-21 ENCOUNTER — Encounter: Payer: Self-pay | Admitting: Internal Medicine

## 2016-04-21 ENCOUNTER — Other Ambulatory Visit
Admission: RE | Admit: 2016-04-21 | Discharge: 2016-04-21 | Disposition: A | Discharge status: Home / Self Care | Payer: Managed Care, Other (non HMO) | Source: Ambulatory Visit | Attending: Internal Medicine | Admitting: Internal Medicine

## 2016-04-21 VITALS — BP 136/80 | HR 98 | Ht 63.0 in | Wt 267.0 lb

## 2016-04-21 VITALS — BP 118/72 | HR 92 | Temp 98.1°F | Resp 18 | Wt 266.0 lb

## 2016-04-21 DIAGNOSIS — E612 Magnesium deficiency: Secondary | ICD-10-CM | POA: Diagnosis not present

## 2016-04-21 DIAGNOSIS — IMO0002 Reserved for concepts with insufficient information to code with codable children: Secondary | ICD-10-CM

## 2016-04-21 DIAGNOSIS — E1165 Type 2 diabetes mellitus with hyperglycemia: Secondary | ICD-10-CM | POA: Diagnosis not present

## 2016-04-21 DIAGNOSIS — E669 Obesity, unspecified: Secondary | ICD-10-CM

## 2016-04-21 DIAGNOSIS — Z1239 Encounter for other screening for malignant neoplasm of breast: Secondary | ICD-10-CM

## 2016-04-21 DIAGNOSIS — E118 Type 2 diabetes mellitus with unspecified complications: Secondary | ICD-10-CM | POA: Diagnosis not present

## 2016-04-21 DIAGNOSIS — E785 Hyperlipidemia, unspecified: Secondary | ICD-10-CM

## 2016-04-21 DIAGNOSIS — J849 Interstitial pulmonary disease, unspecified: Secondary | ICD-10-CM

## 2016-04-21 DIAGNOSIS — J449 Chronic obstructive pulmonary disease, unspecified: Secondary | ICD-10-CM | POA: Diagnosis not present

## 2016-04-21 DIAGNOSIS — J45909 Unspecified asthma, uncomplicated: Secondary | ICD-10-CM

## 2016-04-21 DIAGNOSIS — E559 Vitamin D deficiency, unspecified: Secondary | ICD-10-CM | POA: Diagnosis not present

## 2016-04-21 DIAGNOSIS — J45998 Other asthma: Secondary | ICD-10-CM | POA: Diagnosis not present

## 2016-04-21 DIAGNOSIS — I10 Essential (primary) hypertension: Secondary | ICD-10-CM

## 2016-04-21 DIAGNOSIS — Z794 Long term (current) use of insulin: Secondary | ICD-10-CM

## 2016-04-21 DIAGNOSIS — J441 Chronic obstructive pulmonary disease with (acute) exacerbation: Secondary | ICD-10-CM

## 2016-04-21 LAB — CBC WITH DIFFERENTIAL/PLATELET
BASOS ABS: 0.1 10*3/uL (ref 0–0.1)
BASOS PCT: 0 %
EOS ABS: 0.3 10*3/uL (ref 0–0.7)
EOS PCT: 2 %
HCT: 37.5 % (ref 35.0–47.0)
HEMOGLOBIN: 12.9 g/dL (ref 12.0–16.0)
LYMPHS ABS: 3.2 10*3/uL (ref 1.0–3.6)
LYMPHS PCT: 25 %
MCH: 28.9 pg (ref 26.0–34.0)
MCHC: 34.3 g/dL (ref 32.0–36.0)
MCV: 84.3 fL (ref 80.0–100.0)
MONOS PCT: 6 %
Monocytes Absolute: 0.8 10*3/uL (ref 0.2–0.9)
Neutro Abs: 8.4 10*3/uL — ABNORMAL HIGH (ref 1.4–6.5)
Neutrophils Relative %: 67 %
Platelets: 280 10*3/uL (ref 150–440)
RBC: 4.44 MIL/uL (ref 3.80–5.20)
RDW: 13.2 % (ref 11.5–14.5)
WBC: 12.6 10*3/uL — AB (ref 3.6–11.0)

## 2016-04-21 LAB — COMPREHENSIVE METABOLIC PANEL
ALBUMIN: 3.7 g/dL (ref 3.5–5.2)
ALK PHOS: 107 U/L (ref 39–117)
ALT: 20 U/L (ref 0–35)
AST: 23 U/L (ref 0–37)
BILIRUBIN TOTAL: 0.4 mg/dL (ref 0.2–1.2)
BUN: 11 mg/dL (ref 6–23)
CALCIUM: 8.9 mg/dL (ref 8.4–10.5)
CO2: 31 mEq/L (ref 19–32)
Chloride: 102 mEq/L (ref 96–112)
Creatinine, Ser: 0.76 mg/dL (ref 0.40–1.20)
GFR: 82.03 mL/min (ref >60.00)
GLUCOSE: 205 mg/dL — AB (ref 70–99)
Potassium: 3.9 mEq/L (ref 3.5–5.1)
Sodium: 141 mEq/L (ref 135–145)
TOTAL PROTEIN: 6.9 g/dL (ref 6.0–8.3)

## 2016-04-21 LAB — LIPID PANEL
CHOLESTEROL: 231 mg/dL — AB (ref 0–200)
HDL: 39.5 mg/dL (ref >39.00)
NONHDL: 191.61
TRIGLYCERIDES: 246 mg/dL — AB (ref 0.0–149.0)
Total CHOL/HDL Ratio: 6
VLDL: 49.2 mg/dL — ABNORMAL HIGH (ref 0.0–40.0)

## 2016-04-21 LAB — MICROALBUMIN / CREATININE URINE RATIO
Creatinine,U: 204.6 mg/dL
MICROALB UR: 0.9 mg/dL (ref 0.0–1.9)
MICROALB/CREAT RATIO: 0.4 mg/g (ref 0.0–30.0)

## 2016-04-21 LAB — VITAMIN D 25 HYDROXY (VIT D DEFICIENCY, FRACTURES): VITD: 3.48 ng/mL — ABNORMAL LOW (ref 30.00–100.00)

## 2016-04-21 LAB — LDL CHOLESTEROL, DIRECT: LDL DIRECT: 155 mg/dL

## 2016-04-21 LAB — MAGNESIUM: Magnesium: 1.7 mg/dL (ref 1.5–2.5)

## 2016-04-21 LAB — HEMOGLOBIN A1C: Hgb A1c MFr Bld: 7.6 % — ABNORMAL HIGH (ref 4.6–6.5)

## 2016-04-21 MED ORDER — HYDROCOD POLST-CPM POLST ER 10-8 MG/5ML PO SUER: 5.0000 mL | Freq: Two times a day (BID) | ORAL | 0 refills | Status: DC

## 2016-04-21 MED ORDER — PREDNISONE 10 MG PO TABS: ORAL_TABLET | ORAL | 0 refills | Status: DC

## 2016-04-21 NOTE — Progress Notes (Signed)
Pre visit review using our clinic review tool, if applicable. No additional management support is needed unless otherwise documented below in the visit note. 

## 2016-04-21 NOTE — Patient Instructions (Addendum)
--  Will send for a full PFT.  --Hi-resolution CT scan re: interstitial lung disease.  --RAST (allergy) blood testing and CBC with differential AFTER BEING OFF OF PREDNISONE FOR 1 WEEK.   --Do not take the currently prescribed prednisone unless you feel that your breathing is worse than usual and does not respond to your nebulizer treatments.

## 2016-04-21 NOTE — Patient Instructions (Addendum)
You  re still very bronchospastic on exam today , so I am repeating your prednisone with a slow 18 day taper    If your BS are all > 200:  increase the Levemir to 45 units while on the prednisone  taper , until your fastings are < 140  Increase the pre meal dose  Of short acting insulin from 10-12 to  15 to 18 units  until your post prandials are < 150   Please use Mychart to send me BS readings in a week    I  have refilled the Tussionex as well

## 2016-04-21 NOTE — Progress Notes (Addendum)
Subjective:  Patient ID: Catherine [name], female    DOB: 06-21-1956  Age: 60 y.o. MRN: [id number]  CC: The primary encounter diagnosis was Uncontrolled type 2 diabetes mellitus with complication, with long-term current use of insulin ([place]). Diagnoses of Hyperlipidemia, Breast cancer screening, Magnesium deficiency, Vitamin D deficiency, Obesity (BMI 30-39.9), COPD exacerbation ([place]), and Essential hypertension were also pertinent to this visit.  HPI Genavive Kubicki presents for follow up on ER visit August 1 for COPD exacerbation with acute on chronic hypoxia . Symptoms  started 5 days prior to presentation with cough productive of  purulent sputum,  Diaphoreisis,  Wheezing,  And dyspnea at rest.  She Takes azithromycin 3 days per week per pulmonology   ER records reviewed.  BP Was elevated,  02 sats 98% on 2 L.   TREATED WITH IM SOLUMETROL AND DUONEB.  Discharged from ER with rx for  Prednisone  6 day taper,  Levaquin, and Tussionex    Uncontrolled  diabetes.  Last visit march,  Asked to return in one month,  ,  Did not  Sugars still running high.  using levemir 40 q am and mealtime insulin  10-12 before each meal.  post prandials  Have been labile but coming down,  154 last night.  Did not bring log of blood sugars   Last week was 260 POST Prandially when on the prednisone    GI issues resolved , but for 2 weeks was having post prandial cramping followed by loose stools.  Previously reported nausea has resolved. Taking a probiotic daily  from [place].   No blood in stools but has occasional hemorrhoidal bleeding . Taking magnesium daily,  Stopped cinnamon due to esophagitis    Lab Results  Component Value Date   HGBA1C 7.6 (H) 04/21/2016       Outpatient Medications Prior to Visit  Medication Sig Dispense Refill  . ACCU-CHEK SMARTVIEW test strip TEST BLOOD SUGAR FOUR TIMES DAILY 100 each 12  . ALPRAZolam (XANAX) 0.25 MG tablet TAKE 1 TABLET BY MOUTH EVERY NIGHT AT BEDTIME AS  NEEDED 30 tablet 0  . aspirin 81 MG EC tablet Take 81 mg by mouth daily.      Marland Kitchen azithromycin (ZITHROMAX) 250 MG tablet Take 1 tablet every Monday, Wednesday & Friday 13 tablet 11  . butalbital-acetaminophen-caffeine (FIORICET, ESGIC) 50-325-40 MG tablet TAKE 1 TABLET BY MOUTH EVERY 4 HOURS AS NEEDED FOR HEADACHE 30 tablet 3  . docusate sodium (COLACE) 100 MG capsule Take 200 mg by mouth daily. Reported on 09/20/2015    . famotidine (PEPCID) 20 MG tablet TAKE 1 TABLET (20 MG TOTAL) BY MOUTH 2 (TWO) TIMES DAILY. 180 tablet 1  . furosemide (LASIX) 40 MG tablet TAKE 1 TABLET BY MOUTH EVERY DAY AS NEEDED FOR LEG SWELLING AND TO MAINTAIN WEIGHT. 30 tablet 5  . Insulin Detemir (LEVEMIR) 100 UNIT/ML Pen Inject 50 Units into the skin daily at 10 pm. 45 mL 3  . Insulin Pen Needle 29G X 12.7MM MISC Use as directed 200 each 1  . Insulin Syringe-Needle U-100 (INSULIN SYRINGE .5CC/31GX5/16") 31G X 5/16" 0.5 ML MISC Test twice a day 100 each 11  . ipratropium-albuterol (DUONEB) 0.5-2.5 (3) MG/3ML SOLN USE 1 VIAL VIA NEBULIZER EVERY 6 HOURS AS NEEDED 1080 mL 3  . latanoprost (XALATAN) 0.005 % ophthalmic solution Place 1 drop into both eyes at bedtime.    . Magnesium 250 MG TABS Take 250 mg by mouth daily.    Marland Kitchen NOVOLOG FLEXPEN  100 UNIT/ML FlexPen INJECT 10 UNITS            SUBCUTANEOUSLY 3 TIMES A   DAY BEFORE MEALS. OFFICE   APPOINTMENT NEEDED ! 90 mL 2  . PARoxetine (PAXIL) 10 MG tablet TAKE 1 TABLET(10 MG) BY MOUTH EVERY MORNING 90 tablet 0  . Potassium 99 MG TABS Take 99 mg by mouth once. Reported on 09/20/2015    . PROAIR HFA 108 (90 Base) MCG/ACT inhaler USE 2 INHALATIONS ORALLY   EVERY 6 HOURS AS NEEDED FORWHEEZING 17 g 1  . umeclidinium-vilanterol (ANORO ELLIPTA) 62.5-25 MCG/INH AEPB Inhale 1 puff into the lungs daily. 90 each 3  . atorvastatin (LIPITOR) 80 MG tablet Take 1 tablet (80 mg total) by mouth daily. 90 tablet 1  . chlorpheniramine-HYDROcodone (TUSSIONEX PENNKINETIC ER) 10-8 MG/5ML SUER Take 5 mLs  by mouth 2 (two) times daily. 115 mL 0  . CINNAMON PO Take 1,000 mg by mouth 2 (two) times daily.    Marland Kitchen glipiZIDE (GLUCOTROL) 5 MG tablet Take 5 mg by mouth daily before breakfast.    . PROAIR HFA 108 (90 Base) MCG/ACT inhaler USE 2 INHALATIONS ORALLY   EVERY 6 HOURS AS NEEDED FORWHEEZING 17 g 1  . chlorpheniramine-HYDROcodone (TUSSIONEX PENNKINETIC ER) 10-8 MG/5ML SUER Take 5 mLs by mouth every 12 (twelve) hours as needed for cough. 115 mL 0  . famotidine (PEPCID) 20 MG tablet Take 1 tablet (20 mg total) by mouth 2 (two) times daily. 60 tablet 2  . levofloxacin (LEVAQUIN) 500 MG tablet Take 1 tablet (500 mg total) by mouth daily. 10 tablet 0  . predniSONE (STERAPRED UNI-PAK 21 TAB) 10 MG (21) TBPK tablet Sig 6 tablet day 1, 5 tablets day 2, 4 tablets day 3,,3tablets day 4, 2 tablets day 5, 1 tablet day 6 take all tablets orally 21 tablet 0   No facility-administered medications prior to visit.     Review of Systems;  Patient denies headache, fevers, malaise, unintentional weight loss, skin rash, eye pain, sinus congestion and sinus pain, sore throat, dysphagia,  hemoptysis , cough, dyspnea, wheezing, chest pain, palpitations, orthopnea, edema, abdominal pain, nausea, melena, diarrhea, constipation, flank pain, dysuria, hematuria, urinary  Frequency, nocturia, numbness, tingling, seizures,  Focal weakness, Loss of consciousness,  Tremor, insomnia, depression, anxiety, and suicidal ideation.      Objective:  BP 118/72   Pulse 92   Temp 98.1 F (36.7 C)   Resp 18   Wt 266 lb (120.7 kg)   BMI 47.12 kg/m   BP Readings from Last 3 Encounters:  04/21/16 136/80  04/21/16 118/72  04/08/16 (!) 154/80    Wt Readings from Last 3 Encounters:  04/21/16 267 lb (121.1 kg)  04/21/16 266 lb (120.7 kg)  01/29/16 268 lb 12 oz (121.9 kg)    General appearance: obese alert, cooperative and appears stated age Throat: lips, mucosa, and tongue normal; teeth and gums normal Neck: no adenopathy, no  carotid bruit, supple, symmetrical, trachea midline and thyroid not enlarged, symmetric, no tenderness/mass/nodules Back: symmetric, no curvature. ROM normal. No CVA tenderness. Lungs: markedly decreased A/M bilaterally, no wheezing, egophony or rales H eart: regular rate and rhythm, S1, S2 normal, no murmur, click, rub or gallop Abdomen: soft, non-tender; bowel sounds normal; no masses,  no organomegaly Pulses: 2+ and symmetric Skin: Skin color, texture, turgor normal. No rashes or lesions Lymph nodes: Cervical, supraclavicular, and axillary nodes normal. Foot exam:  Nails are well trimmed,  No callouses,  Sensation intact to microfilament  Lab Results  Component Value Date   HGBA1C 7.6 (H) 04/21/2016   HGBA1C 8.5 (H) 11/19/2015   HGBA1C 8.4 (H) 09/25/2014    Lab Results  Component Value Date   CREATININE 0.76 04/21/2016   CREATININE 0.64 11/19/2015   CREATININE 0.70 09/25/2014    Lab Results  Component Value Date   WBC 12.6 (H) 04/21/2016   HGB 12.9 04/21/2016   HCT 37.5 04/21/2016   PLT 280 04/21/2016   GLUCOSE 205 (H) 04/21/2016   CHOL 231 (H) 04/21/2016   TRIG 246.0 (H) 04/21/2016   HDL 39.50 04/21/2016   LDLDIRECT 155.0 04/21/2016   LDLCALC 105 (H) 09/25/2014   ALT 20 04/21/2016   AST 23 04/21/2016   NA 141 04/21/2016   K 3.9 04/21/2016   CL 102 04/21/2016   CREATININE 0.76 04/21/2016   BUN 11 04/21/2016   CO2 31 04/21/2016   TSH 0.628 01/04/2012   INR 0.9 07/22/2012   HGBA1C 7.6 (H) 04/21/2016   MICROALBUR 0.9 04/21/2016    No results found.  Assessment & Plan:   Problem List Items Addressed This Visit    HTN (hypertension)    Recent elevation likely a stress reaction. Has been normotensive when not in respiratory distress .        Relevant Medications   rosuvastatin (CRESTOR) 40 MG tablet   Hyperlipidemia    Goal is LDL < 70 given history of 2 vessel disease by 2012 cath. She is on maximal dose of Lipitor,  Will change to crestor,   Lab Results    Component Value Date   CHOL 231 (H) 04/21/2016   HDL 39.50 04/21/2016   LDLCALC 105 (H) 09/25/2014   LDLDIRECT 155.0 04/21/2016   TRIG 246.0 (H) 04/21/2016   CHOLHDL 6 04/21/2016         Relevant Medications   rosuvastatin (CRESTOR) 40 MG tablet   Other Relevant Orders   Lipid panel (Completed)   LDL cholesterol, direct (Completed)   Obesity (BMI 30-39.9)    Problematic in terms of motivation to lose weight,, she leads a sedentary life due to advanced COPD.  I have addressed  BMI and recommended wt loss of 10% of body weigh over the next 6 months using a low glycemic index diet and regular daily walking  a minimum of 5 days per week.        Diabetes mellitus type 2, uncontrolled, with complications ([place]) - Primary    aggravated by use of prednisone and loss to follow up since March  Advised to increase Levemir to 45 units and increase sliding scale mealtime insulin to 15 to 18 units based on post prandials and carb counting .  Lab Results  Component Value Date   HGBA1C 7.6 (H) 04/21/2016         Relevant Medications   rosuvastatin (CRESTOR) 40 MG tablet   Other Relevant Orders   Hemoglobin A1c (Completed)   Comp Met (CMET) (Completed)   Microalbumin / creatinine urine ratio (Completed)   COPD exacerbation ([place])    ER records reviewed with patient .  Her cough has improved but she remains dyspneic with minimal exertion and she is bronchospastic on exam,.  Will resume prednisone over an 18 day taper. Tussionex refilled as well,       Relevant Medications   predniSONE (DELTASONE) 10 MG tablet   chlorpheniramine-HYDROcodone (TUSSIONEX PENNKINETIC ER) 10-8 MG/5ML SUER   Vitamin D deficiency    Will start weekly Drisdol in perpetuity for level of  3.5 !       Relevant Orders   VITAMIN D 25 Hydroxy (Vit-D Deficiency, Fractures) (Completed)    Other Visit Diagnoses    Breast cancer screening       Relevant Orders   MM DIGITAL SCREENING BILATERAL   Magnesium deficiency        Relevant Orders   Magnesium (Completed)      I have discontinued Ms. Blomberg's atorvastatin, levofloxacin, and predniSONE. I am also having her start on predniSONE, rosuvastatin, and ergocalciferol. Additionally, I am having her maintain her aspirin, docusate sodium, INSULIN SYRINGE .5CC/31GX5/16", ACCU-CHEK SMARTVIEW, furosemide, latanoprost, ALPRAZolam, PARoxetine, Insulin Pen Needle, Potassium, Magnesium, Insulin Detemir, ipratropium-albuterol, PROAIR HFA, NOVOLOG FLEXPEN, azithromycin, butalbital-acetaminophen-caffeine, famotidine, umeclidinium-vilanterol, and chlorpheniramine-HYDROcodone.  Meds ordered this encounter  Medications  . predniSONE (DELTASONE) 10 MG tablet    Sig: 6 tablets daily for 3 days,  Reduce every 3 days by 1 tablet until gone ( 18 day taper )    Dispense:  63 tablet    Refill:  0  . chlorpheniramine-HYDROcodone (TUSSIONEX PENNKINETIC ER) 10-8 MG/5ML SUER    Sig: Take 5 mLs by mouth 2 (two) times daily.    Dispense:  115 mL    Refill:  0  . rosuvastatin (CRESTOR) 40 MG tablet    Sig: Take 1 tablet (40 mg total) by mouth daily.    Dispense:  30 tablet    Refill:  5  . ergocalciferol (DRISDOL) 50000 units capsule    Sig: Take 1 capsule (50,000 Units total) by mouth once a week.    Dispense:  12 capsule    Refill:  3    Medications Discontinued During This Encounter  Medication Reason  . chlorpheniramine-HYDROcodone (TUSSIONEX PENNKINETIC ER) 01-7 CB/4WH SUER Duplicate  . famotidine (PEPCID) 20 MG tablet Duplicate  . predniSONE (STERAPRED UNI-PAK 21 TAB) 10 MG (21) TBPK tablet Completed Course  . levofloxacin (LEVAQUIN) 500 MG tablet Completed Course  . chlorpheniramine-HYDROcodone (TUSSIONEX PENNKINETIC ER) 10-8 MG/5ML SUER Reorder  . atorvastatin (LIPITOR) 80 MG tablet    A total of 40 minutes was spent with patient more than half of which was spent in counseling patient on the above mentioned issues , reviewing and explaining recent labs and  imaging studies done, and coordination of care.  Follow-up: Return in about 4 weeks (around 05/19/2016) for follow up diabetes.   Crecencio Mc, MD

## 2016-04-22 ENCOUNTER — Encounter: Payer: Self-pay | Admitting: Internal Medicine

## 2016-04-22 DIAGNOSIS — E559 Vitamin D deficiency, unspecified: Secondary | ICD-10-CM | POA: Insufficient documentation

## 2016-04-22 LAB — MISC LABCORP TEST (SEND OUT): LABCORP TEST CODE: 602628

## 2016-04-22 MED ORDER — ERGOCALCIFEROL 1.25 MG (50000 UT) PO CAPS: 50000.0000 [IU] | ORAL_CAPSULE | ORAL | 3 refills | Status: DC

## 2016-04-22 MED ORDER — ROSUVASTATIN CALCIUM 40 MG PO TABS: 40.0000 mg | ORAL_TABLET | Freq: Every day | ORAL | 5 refills | Status: DC

## 2016-04-22 NOTE — Assessment & Plan Note (Signed)
Recent elevation likely a stress reaction. Has been normotensive when not in respiratory distress .

## 2016-04-22 NOTE — Assessment & Plan Note (Signed)
aggravated by use of prednisone and loss to follow up since March  Advised to increase Levemir to 45 units and increase sliding scale mealtime insulin to 15 to 18 units based on post prandials and carb counting .  Lab Results  Component Value Date   HGBA1C 7.6 (H) 04/21/2016

## 2016-04-22 NOTE — Assessment & Plan Note (Signed)
Goal is LDL < 70 given history of 2 vessel disease by 2012 cath. She is on maximal dose of Lipitor,  Will change to crestor,   Lab Results  Component Value Date   CHOL 231 (H) 04/21/2016   HDL 39.50 04/21/2016   LDLCALC 105 (H) 09/25/2014   LDLDIRECT 155.0 04/21/2016   TRIG 246.0 (H) 04/21/2016   CHOLHDL 6 04/21/2016

## 2016-04-22 NOTE — Assessment & Plan Note (Signed)
ER records reviewed with patient .  Her cough has improved but she remains dyspneic with minimal exertion and she is bronchospastic on exam,.  Will resume prednisone over an 18 day taper. Tussionex refilled as well,

## 2016-04-22 NOTE — Assessment & Plan Note (Signed)
Problematic in terms of motivation to lose weight,, she leads a sedentary life due to advanced COPD.  I have addressed  BMI and recommended wt loss of 10% of body weigh over the next 6 months using a low glycemic index diet and regular daily walking  a minimum of 5 days per week.

## 2016-04-22 NOTE — Addendum Note (Signed)
Addended by: Sherlene ShamsULLO, Issak Goley L on: 04/22/2016 10:04 AM   Modules accepted: Orders

## 2016-04-22 NOTE — Assessment & Plan Note (Signed)
Will start weekly Drisdol in perpetuity for level of 3.5 !

## 2016-05-07 ENCOUNTER — Other Ambulatory Visit: Payer: Self-pay | Admitting: Internal Medicine

## 2016-05-07 NOTE — Telephone Encounter (Signed)
Please advise to refill a prednisone Taper?

## 2016-05-08 NOTE — Telephone Encounter (Signed)
Her pulmonologist did not want her to take prednisone unless her breathing did no respond to nebulized treatments.  Since she obviously feels it is necessary,  I will refill,  But She needs to let him know and follow up with HIM,. bc he was going to resume a med called Daliresp

## 2016-05-08 NOTE — Telephone Encounter (Signed)
Ok can you call pharmacy and cancel it? thanks

## 2016-05-19 ENCOUNTER — Other Ambulatory Visit: Payer: Self-pay | Admitting: Internal Medicine

## 2016-06-04 ENCOUNTER — Ambulatory Visit: Payer: Managed Care, Other (non HMO) | Admitting: Internal Medicine

## 2016-06-13 ENCOUNTER — Telehealth: Payer: Self-pay | Admitting: *Deleted

## 2016-06-13 MED ORDER — PAROXETINE HCL 10 MG PO TABS: ORAL_TABLET | ORAL | 1 refills | Status: DC

## 2016-06-13 NOTE — Telephone Encounter (Signed)
Refill request for Paxil, last seen [id number], last filled [id number] .  Please advise.

## 2016-06-13 NOTE — Telephone Encounter (Signed)
Patient requested a medication refill for paroxetine -90 day supply Pharmacy [facility] in [place]

## 2016-06-13 NOTE — Telephone Encounter (Signed)
[facility] day supply sent to [facility] . Needs November OV for diabetes follow up

## 2016-06-17 ENCOUNTER — Telehealth: Payer: Self-pay | Admitting: Internal Medicine

## 2016-06-17 NOTE — Telephone Encounter (Signed)
Received fax from [facility] for request for last OV notes and PFT's plus or arterial blood gas I have most recent but we have no recent PFT's ordered but has not been completed by patient per chart.

## 2016-06-17 NOTE — Telephone Encounter (Signed)
Send what we have an let patient know that without PFTS,  [facility] may not pay . If she has not kept a scheduled PFT , she needs to reschedule

## 2016-06-19 NOTE — Telephone Encounter (Signed)
[facility] requesting Pulmonology notes and PFTs advised patient to call Pulmonology

## 2016-06-24 ENCOUNTER — Telehealth: Payer: Self-pay | Admitting: Internal Medicine

## 2016-06-24 NOTE — Telephone Encounter (Signed)
Pt is calling stating that [facility] needs a "lung test". Pt is unsure of what she needs. Please call.

## 2016-06-24 NOTE — Telephone Encounter (Signed)
[facility] with [place] at [facility] and she ask that we fax over last OV note and if pt needs appt they will let pt and us know.  Spoke with pt and informed her. Nothing further needed.

## 2016-06-25 ENCOUNTER — Other Ambulatory Visit: Payer: Self-pay | Admitting: *Deleted

## 2016-06-25 DIAGNOSIS — J849 Interstitial pulmonary disease, unspecified: Secondary | ICD-10-CM

## 2016-07-11 ENCOUNTER — Ambulatory Visit: Payer: Managed Care, Other (non HMO)

## 2016-07-11 ENCOUNTER — Ambulatory Visit (INDEPENDENT_AMBULATORY_CARE_PROVIDER_SITE_OTHER): Payer: Managed Care, Other (non HMO) | Admitting: Internal Medicine

## 2016-07-11 ENCOUNTER — Encounter: Payer: Self-pay | Admitting: Internal Medicine

## 2016-07-11 DIAGNOSIS — Z1211 Encounter for screening for malignant neoplasm of colon: Secondary | ICD-10-CM

## 2016-07-11 DIAGNOSIS — Z0000 Encounter for general adult medical examination without abnormal findings: Secondary | ICD-10-CM | POA: Diagnosis not present

## 2016-07-11 DIAGNOSIS — IMO0002 Reserved for concepts with insufficient information to code with codable children: Secondary | ICD-10-CM

## 2016-07-11 DIAGNOSIS — J432 Centrilobular emphysema: Secondary | ICD-10-CM | POA: Diagnosis not present

## 2016-07-11 DIAGNOSIS — E118 Type 2 diabetes mellitus with unspecified complications: Secondary | ICD-10-CM | POA: Diagnosis not present

## 2016-07-11 DIAGNOSIS — E669 Obesity, unspecified: Secondary | ICD-10-CM | POA: Diagnosis not present

## 2016-07-11 DIAGNOSIS — Z794 Long term (current) use of insulin: Secondary | ICD-10-CM

## 2016-07-11 DIAGNOSIS — E1165 Type 2 diabetes mellitus with hyperglycemia: Secondary | ICD-10-CM

## 2016-07-11 MED ORDER — ROSUVASTATIN CALCIUM 40 MG PO TABS: 40.0000 mg | ORAL_TABLET | Freq: Every day | ORAL | 1 refills | Status: DC

## 2016-07-11 MED ORDER — ALPRAZOLAM 0.5 MG PO TABS: 0.5000 mg | ORAL_TABLET | Freq: Every evening | ORAL | 5 refills | Status: DC | PRN

## 2016-07-11 MED ORDER — PAROXETINE HCL 20 MG PO TABS: 20.0000 mg | ORAL_TABLET | Freq: Every day | ORAL | 1 refills | Status: DC

## 2016-07-11 NOTE — Progress Notes (Signed)
Pre-visit discussion using our clinic review tool. No additional management support is needed unless otherwise documented below in the visit note.  

## 2016-07-11 NOTE — Patient Instructions (Addendum)
Oatmeal cookies are full of sugar.  Try eating an apple and cheese instead  Honey Crisp and Fuji apples plus cheddar cheese as your midnight snack instead of oatmeal cookies.    You need to have fasting labs done   In December .  You can do them during the wellness visit if you fast for 3 hours prior  Eat lunch by 12pm and do labs at  Wellness visit with Denisa   Increase you dose of Paxil to 20 mg daily.  The alprazolam has been increased to 0.5 mg for bedtime use    Your mammogram will be ordered on Monday

## 2016-07-11 NOTE — Progress Notes (Signed)
Subjective:  Patient ID: Catherine [name], female    DOB: Aug 26, 1956  Age: 60 y.o. MRN: [id number]  CC: Diagnoses of Centrilobular emphysema (HCC), Uncontrolled type 2 diabetes mellitus with complication, with long-term current use of insulin (HCC), Encounter for preventive health examination, Obesity (BMI 30-39.9), and Screening for colon cancer were pertinent to this visit.  HPI Catherine [name] presents for follow up on Type 2 DM complicated by frequent use of steroids for control of COPD Exacerbation  Lost 2 siblings in the last 2 months (Sept 10 and October 6) both in [place]> . Sister died of lung cancer the recurred in lung breast and brain and liver.  paxil not helping much . Not sleeping well without the alprazolam  Pain in left nipple intermittent, hurts to touch,   Shoots through the nipple,, occurs sporadically y. Does not wear a bra most days .  Several months duration,  No lactation,  No change in color  Or shape  DM:  Sugars 105,  135  Max 190  Eating salads at night.  Fastings often high in the mornings. bc of oatmeal cookie,  Nuts gave her diarrhea.    Lab Results  Component Value Date   HGBA1C 7.6 (H) 04/21/2016     Outpatient Medications Prior to Visit  Medication Sig Dispense Refill  . ACCU-CHEK SMARTVIEW test strip TEST BLOOD SUGAR FOUR TIMES DAILY 100 each 12  . aspirin 81 MG EC tablet Take 81 mg by mouth daily.      Marland Kitchen. atorvastatin (LIPITOR) 80 MG tablet TAKE 1 TABLET (80 MG TOTAL) BY MOUTH DAILY. 90 tablet 1  . azithromycin (ZITHROMAX) 250 MG tablet Take 1 tablet every Monday, Wednesday & Friday 13 tablet 11  . butalbital-acetaminophen-caffeine (FIORICET, ESGIC) 50-325-40 MG tablet TAKE 1 TABLET BY MOUTH EVERY 4 HOURS AS NEEDED FOR HEADACHE 30 tablet 3  . chlorpheniramine-HYDROcodone (TUSSIONEX PENNKINETIC ER) 10-8 MG/5ML SUER Take 5 mLs by mouth 2 (two) times daily. 115 mL 0  . docusate sodium (COLACE) 100 MG capsule Take 200 mg by mouth daily. Reported on  09/20/2015    . ergocalciferol (DRISDOL) 50000 units capsule Take 1 capsule (50,000 Units total) by mouth once a week. 12 capsule 3  . famotidine (PEPCID) 20 MG tablet TAKE 1 TABLET (20 MG TOTAL) BY MOUTH 2 (TWO) TIMES DAILY. 180 tablet 1  . furosemide (LASIX) 40 MG tablet TAKE 1 TABLET BY MOUTH EVERY DAY AS NEEDED FOR LEG SWELLING AND TO MAINTAIN WEIGHT. 30 tablet 5  . glipiZIDE (GLUCOTROL) 5 MG tablet TAKE 1 TABLET BY MOUTH BEFORE BREAKFAST AND 2 TABLETS BEFORE DINNER 270 tablet 2  . Insulin Detemir (LEVEMIR) 100 UNIT/ML Pen Inject 50 Units into the skin daily at 10 pm. 45 mL 3  . Insulin Pen Needle 29G X 12.7MM MISC Use as directed 200 each 1  . Insulin Syringe-Needle U-100 (INSULIN SYRINGE .5CC/31GX5/16") 31G X 5/16" 0.5 ML MISC Test twice a day 100 each 11  . ipratropium-albuterol (DUONEB) 0.5-2.5 (3) MG/3ML SOLN USE 1 VIAL VIA NEBULIZER EVERY 6 HOURS AS NEEDED 1080 mL 3  . latanoprost (XALATAN) 0.005 % ophthalmic solution Place 1 drop into both eyes at bedtime.    . Magnesium 250 MG TABS Take 250 mg by mouth daily.    Marland Kitchen. NOVOLOG FLEXPEN 100 UNIT/ML FlexPen INJECT 10 UNITS            SUBCUTANEOUSLY 3 TIMES A   DAY BEFORE MEALS. OFFICE   APPOINTMENT NEEDED ! 90 mL 2  .  Potassium 99 MG TABS Take 99 mg by mouth once. Reported on 09/20/2015    . PROAIR HFA 108 (90 Base) MCG/ACT inhaler USE 2 INHALATIONS ORALLY   EVERY 6 HOURS AS NEEDED FORWHEEZING 17 g 1  . umeclidinium-vilanterol (ANORO ELLIPTA) 62.5-25 MCG/INH AEPB Inhale 1 puff into the lungs daily. 90 each 3  . ALPRAZolam (XANAX) 0.25 MG tablet TAKE 1 TABLET BY MOUTH EVERY NIGHT AT BEDTIME AS NEEDED 30 tablet 0  . PARoxetine (PAXIL) 10 MG tablet TAKE 1 TABLET(10 MG) BY MOUTH EVERY MORNING 90 tablet 1  . rosuvastatin (CRESTOR) 40 MG tablet Take 1 tablet (40 mg total) by mouth daily. 30 tablet 5  . predniSONE (DELTASONE) 10 MG tablet 6 TABLETS DAILY FOR 3 DAYS, REDUCE EVERY 3 DAYS BY 1 TABLET UNTIL GONE ( 18 DAY TAPER ) (Patient not taking:  Reported on 07/11/2016) 63 tablet 0   No facility-administered medications prior to visit.     Review of Systems;  Patient denies headache, fevers, malaise, unintentional weight loss, skin rash, eye pain, sinus congestion and sinus pain, sore throat, dysphagia,  hemoptysis , cough, dyspnea, wheezing, chest pain, palpitations, orthopnea, edema, abdominal pain, nausea, melena, diarrhea, constipation, flank pain, dysuria, hematuria, urinary  Frequency, nocturia, numbness, tingling, seizures,  Focal weakness, Loss of consciousness,  Tremor, insomnia, depression, anxiety, and suicidal ideation.      Objective:  BP 132/78   Pulse 94   Temp 98 F (36.7 C) (Oral)   Resp 12   Ht 5\' 3"  (1.6 m)   Wt 272 lb 8 oz (123.6 kg)   SpO2 96% Comment: on 4L of 02  BMI 48.27 kg/m   BP Readings from Last 3 Encounters:  07/11/16 132/78  04/21/16 136/80  04/21/16 118/72    Wt Readings from Last 3 Encounters:  07/11/16 272 lb 8 oz (123.6 kg)  04/21/16 267 lb (121.1 kg)  04/21/16 266 lb (120.7 kg)    General appearance: alert, cooperative and appears stated age Head: Normocephalic, without obvious abnormality, atraumatic Eyes: conjunctivae/corneas clear. PERRL, EOM's intact. Fundi benign. Ears: normal TM's and external ear canals both ears Nose: Nares normal. Septum midline. Mucosa normal. No drainage or sinus tenderness. Throat: lips, mucosa, and tongue normal; teeth and gums normal Neck: no adenopathy, no carotid bruit, no JVD, supple, symmetrical, trachea midline and thyroid not enlarged, symmetric, no tenderness/mass/nodules Lungs: clear to auscultation bilaterally Breasts: pendulous, no nipple changes or asymmetry , no masses or tenderness Heart: regular rate and rhythm, S1, S2 normal, no murmur, click, rub or gallop Abdomen: soft, non-tender; bowel sounds normal; no masses,  no organomegaly Extremities: extremities normal, atraumatic, no cyanosis or edema Pulses: 2+ and symmetric Skin: Skin  color, texture, turgor normal. No rashes or lesions Neurologic: Alert and oriented X 3, normal strength and tone. Normal symmetric reflexes. Normal coordination and gait. .  Lab Results  Component Value Date   HGBA1C 7.6 (H) 04/21/2016   HGBA1C 8.5 (H) 11/19/2015   HGBA1C 8.4 (H) 09/25/2014    Lab Results  Component Value Date   CREATININE 0.76 04/21/2016   CREATININE 0.64 11/19/2015   CREATININE 0.70 09/25/2014    Lab Results  Component Value Date   WBC 12.6 (H) 04/21/2016   HGB 12.9 04/21/2016   HCT 37.5 04/21/2016   PLT 280 04/21/2016   GLUCOSE 205 (H) 04/21/2016   CHOL 231 (H) 04/21/2016   TRIG 246.0 (H) 04/21/2016   HDL 39.50 04/21/2016   LDLDIRECT 155.0 04/21/2016   LDLCALC 105 (H)  09/25/2014   ALT 20 04/21/2016   AST 23 04/21/2016   NA 141 04/21/2016   K 3.9 04/21/2016   CL 102 04/21/2016   CREATININE 0.76 04/21/2016   BUN 11 04/21/2016   CO2 31 04/21/2016   TSH 0.628 01/04/2012   INR 0.9 07/22/2012   HGBA1C 7.6 (H) 04/21/2016   MICROALBUR 0.9 04/21/2016    No results found.  Assessment & Plan:   Problem List Items Addressed This Visit    Screening for colon cancer    Last colonoscopy 2012, polyps found .  3 yr follow up advised but deferred due to frequent COPD exacerbations and chronic respiratory failure. Will recommend Cologuard.       Obesity (BMI 30-39.9)    She is limited by severe COPD . She cannot walk > 50 feet without being short of breath.  Low Gi diet advise.d       Diabetes mellitus type 2, uncontrolled, with complications (HCC)    aggravated by use of prednisone Was   Advised to increase Levemir to 45 units at last visit and increase sliding scale mealtime insulin to 15 to 18 units based on post prandials and carb counting . Current elevations due to dietary noncompliance  .  Diet reviewed and elimination of oatmeal cookies advised in favor of apples/cheese or other high protein /low GI alternatives. RTC 3 weeks for labs   Lab Results    Component Value Date   HGBA1C 7.6 (H) 04/21/2016         Relevant Medications   rosuvastatin (CRESTOR) 40 MG tablet   COPD (chronic obstructive pulmonary disease) (HCC)    End stage, 02 dependent,  Managed by Pulmonary,  Request for oxygen concentrator deferred to pulmonologist       Encounter for preventive health examination    Annual comprehensive preventive exam was done as well as an evaluation and management of chronic conditions .  During the course of the visit the patient was educated and counseled about appropriate screening and preventive services including :  diabetes screening, lipid analysis with projected  10 year  risk for CAD , nutrition counseling, breast, cervical and colorectal cancer screening, and recommended immunizations.  Printed recommendations for health maintenance screenings was given       Other Visit Diagnoses   None.     I have changed Ms. Castrogiovanni's PARoxetine and ALPRAZolam. I am also having her maintain her aspirin, docusate sodium, INSULIN SYRINGE .5CC/31GX5/16", ACCU-CHEK SMARTVIEW, furosemide, latanoprost, Insulin Pen Needle, Potassium, Magnesium, Insulin Detemir, ipratropium-albuterol, PROAIR HFA, NOVOLOG FLEXPEN, azithromycin, butalbital-acetaminophen-caffeine, famotidine, umeclidinium-vilanterol, glipiZIDE, chlorpheniramine-HYDROcodone, ergocalciferol, predniSONE, atorvastatin, and rosuvastatin.  Meds ordered this encounter  Medications  . rosuvastatin (CRESTOR) 40 MG tablet    Sig: Take 1 tablet (40 mg total) by mouth daily.    Dispense:  90 tablet    Refill:  1  . PARoxetine (PAXIL) 20 MG tablet    Sig: Take 1 tablet (20 mg total) by mouth daily. TAKE 1 TABLET(10 MG) BY MOUTH EVERY MORNING    Dispense:  90 tablet    Refill:  1    Do nor fill yet,  Keep on file for refill on about 40 days  . ALPRAZolam (XANAX) 0.5 MG tablet    Sig: Take 1 tablet (0.5 mg total) by mouth at bedtime as needed.    Dispense:  30 tablet    Refill:  5     Medications Discontinued During This Encounter  Medication Reason  . rosuvastatin (CRESTOR) 40  MG tablet Reorder  . PARoxetine (PAXIL) 10 MG tablet Reorder  . ALPRAZolam (XANAX) 0.25 MG tablet Reorder    Follow-up: No Follow-up on file.   Sherlene ShamsULLO, Lijah Bourque L, MD

## 2016-07-13 NOTE — Assessment & Plan Note (Signed)
End stage, 02 dependent,  Managed by Pulmonary,  Request for oxygen concentrator deferred to pulmonologist

## 2016-07-13 NOTE — Assessment & Plan Note (Signed)
aggravated by use of prednisone Was   Advised to increase Levemir to 45 units at last visit and increase sliding scale mealtime insulin to 15 to 18 units based on post prandials and carb counting . Current elevations due to dietary noncompliance  .  Diet reviewed and elimination of oatmeal cookies advised in favor of apples/cheese or other high protein /low GI alternatives. RTC 3 weeks for labs   Lab Results  Component Value Date   HGBA1C 7.6 (H) 04/21/2016

## 2016-07-13 NOTE — Assessment & Plan Note (Signed)
She is limited by severe COPD . She cannot walk > 50 feet without being short of breath.  Low Gi diet advise.d

## 2016-07-13 NOTE — Assessment & Plan Note (Signed)
Annual comprehensive preventive exam was done as well as an evaluation and management of chronic conditions .  During the course of the visit the patient was educated and counseled about appropriate screening and preventive services including :  diabetes screening, lipid analysis with projected  10 year  risk for CAD , nutrition counseling, breast, cervical and colorectal cancer screening, and recommended immunizations.  Printed recommendations for health maintenance screenings was given 

## 2016-07-13 NOTE — Assessment & Plan Note (Addendum)
Last colonoscopy 2012, polyps found .  3 yr follow up advised but deferred due to frequent COPD exacerbations and chronic respiratory failure. Will recommend Cologuard.

## 2016-07-14 ENCOUNTER — Telehealth: Payer: Self-pay | Admitting: Internal Medicine

## 2016-07-14 NOTE — Telephone Encounter (Signed)
Pt would like to r/s her CT for 11/8. She would like it on  11/13 the same day of her PFT

## 2016-07-14 NOTE — Telephone Encounter (Signed)
Catherine [name],  Pt wants to r/s her CT per message below. Please call pt. Thanks.

## 2016-07-14 NOTE — Telephone Encounter (Signed)
Called to advise patient that she could contact [facility] schedulers and they could R/S her CT for her. Pt was not at home when I called. Left message to advise patient that I had return her call. Rhonda J Cobb

## 2016-07-15 NOTE — Telephone Encounter (Signed)
CT Chest appointment R/S to 07/21/16 at 4:00 at [facility]. F/U Appointment R/S to 07/23/16. Called and spoke with patient and she is aware of PFT, CT Chest and ROV. Appointment card, PFT Instructions and CT Chest appointment mailed to patient. Rhonda J Cobb

## 2016-07-16 ENCOUNTER — Ambulatory Visit: Payer: Managed Care, Other (non HMO)

## 2016-07-17 ENCOUNTER — Ambulatory Visit: Payer: Managed Care, Other (non HMO) | Admitting: Internal Medicine

## 2016-07-21 ENCOUNTER — Encounter: Payer: Self-pay | Admitting: *Deleted

## 2016-07-21 ENCOUNTER — Ambulatory Visit
Admission: EM | Admit: 2016-07-21 | Discharge: 2016-07-21 | Disposition: A | Discharge status: Home / Self Care | Payer: Managed Care, Other (non HMO) | Attending: Family Medicine | Admitting: Family Medicine

## 2016-07-21 ENCOUNTER — Ambulatory Visit: Payer: Managed Care, Other (non HMO)

## 2016-07-21 ENCOUNTER — Telehealth: Payer: Self-pay | Admitting: Internal Medicine

## 2016-07-21 DIAGNOSIS — J441 Chronic obstructive pulmonary disease with (acute) exacerbation: Secondary | ICD-10-CM | POA: Diagnosis not present

## 2016-07-21 LAB — RAPID STREP SCREEN (MED CTR MEBANE ONLY): Streptococcus, Group A Screen (Direct): NEGATIVE

## 2016-07-21 MED ORDER — HYDROCOD POLST-CPM POLST ER 10-8 MG/5ML PO SUER: ORAL | 0 refills | Status: DC

## 2016-07-21 MED ORDER — PREDNISONE 20 MG PO TABS: ORAL_TABLET | ORAL | 0 refills | Status: DC

## 2016-07-21 MED ORDER — DOXYCYCLINE HYCLATE 100 MG PO TABS: 100.0000 mg | ORAL_TABLET | Freq: Two times a day (BID) | ORAL | 0 refills | Status: DC

## 2016-07-21 NOTE — Telephone Encounter (Signed)
CT Chest High Res rescheduled for 07[facility]29/16 [facility] . [facility]. No prep.  Called and spoke with patient and she is aware of appointment date, time and location. Nothing else needed at this time. Rhonda J Cobb

## 2016-07-21 NOTE — ED Triage Notes (Signed)
Patient started having symptoms of sore throat, nasal congestion, and fever 3 days ago. Productive cough with chills started today.

## 2016-07-21 NOTE — Telephone Encounter (Signed)
Pt is sick and needs to have someone reschedule her CT that is for today She is asking if we can do it on 07/29/16 when she comes to do her PFT  Please call back

## 2016-07-21 NOTE — ED Provider Notes (Signed)
[facility]    CSN: [id number] Arrival date & time: 07/21/16  1648     History   Chief Complaint Chief Complaint  Patient presents with  . Sore Throat  . Fever  . Cough    HPI Catherine [name] is a 60 y.o. female.   The history is provided by the patient.  Sore Throat   Fever  Associated symptoms: cough   Cough  Associated symptoms: fever and wheezing   URI  Presenting symptoms: cough and fever   Severity:  Moderate Onset quality:  Sudden Duration:  5 days Timing:  Constant Progression:  Worsening Chronicity:  New Relieved by:  Nothing Ineffective treatments:  OTC medications Associated symptoms: wheezing   Risk factors: chronic respiratory disease (copd) and diabetes mellitus     Past Medical History:  Diagnosis Date  . Anxiety   . Asthma   . COPD (chronic obstructive pulmonary disease) (HCC)   . Depression   . Diabetes mellitus   . Gastritis and duodenitis June 2011   EGD  . GERD (gastroesophageal reflux disease)   . Hyperglycemia   . Hyperlipidemia   . Hypertension   . Leukocytosis   . Obesity (BMI 30-39.9)   . S/P cardiac catheterization March 2012   normal coronaries,  due to chest pain ([place])  . Screening for breast cancer Jul 28 2011   normal  . Screening for colon cancer June 2011   polyps found, next one due 2014 ([place])  . Tobacco abuse, in remission    quit oct during hospitalization     Patient Active Problem List   Diagnosis Date Noted  . Vitamin D deficiency 04/22/2016  . Tachycardia 11/21/2014  . Encounter for preventive health examination 09/26/2014  . Neck pain of over 3 months duration 09/26/2014  . Inflammatory arthritis 06/13/2014  . Elevated alkaline phosphatase measurement 10/09/2013  . Leg swelling 04/05/2013  . OSA (obstructive sleep apnea) 03/01/2013  . Allergic rhinitis 02/04/2013  . Pulmonary nodule 09/15/2012  . COPD exacerbation (HCC) 05/28/2012  . Chronic hypoxemic respiratory failure (HCC)  05/03/2012  . COPD (chronic obstructive pulmonary disease) (HCC) 04/28/2012  . Diabetes mellitus type 2, uncontrolled, with complications (HCC) 11/07/2011  . GERD (gastroesophageal reflux disease)   . Obesity (BMI 30-39.9)   . Depression   . Anxiety   . Tobacco abuse, in remission   . Screening for colon cancer   . Screening for breast cancer   . CAD (coronary artery disease) 12/02/2010  . HTN (hypertension) 12/02/2010  . Hyperlipidemia 12/02/2010    Past Surgical History:  Procedure Laterality Date  . ABDOMINAL HYSTERECTOMY    . ABDOMINAL HYSTERECTOMY    . BILATERAL OOPHORECTOMY  1995   and TAH.  noncancerous reasons  . CARDIAC CATHETERIZATION  11/22/2010   no significant disease  . HEMORRHOID SURGERY      OB History    No data available       Home Medications    Prior to Admission medications   Medication Sig Start Date End Date Taking? Authorizing Provider  ACCU-CHEK SMARTVIEW test strip TEST BLOOD SUGAR FOUR TIMES DAILY 11/04/12   Sherlene Shams, MD  ALPRAZolam Prudy Feeler) 0.5 MG tablet Take 1 tablet (0.5 mg total) by mouth at bedtime as needed. 07/11/16   Sherlene Shams, MD  aspirin 81 MG EC tablet Take 81 mg by mouth daily.      Historical Provider, MD  atorvastatin (LIPITOR) 80 MG tablet TAKE 1 TABLET (80 MG TOTAL) BY  MOUTH DAILY. 05/19/16   Sherlene Shams, MD  azithromycin (ZITHROMAX) 250 MG tablet Take 1 tablet every Monday, Wednesday & Friday 01/24/16   Shane Crutch, MD  butalbital-acetaminophen-caffeine (FIORICET, ESGIC) [phone] MG tablet TAKE 1 TABLET BY MOUTH EVERY 4 HOURS AS NEEDED FOR HEADACHE 01/29/16   Sherlene Shams, MD  chlorpheniramine-HYDROcodone [facility] PENNKINETIC ER) 10-8 MG/5ML SUER 2.5 ml po qhs prn cough 07/21/16   Payton Mccallum, MD  docusate sodium (COLACE) 100 MG capsule Take 200 mg by mouth daily. Reported on 09/20/2015    Historical Provider, MD  doxycycline (VIBRA-TABS) 100 MG tablet Take 1 tablet (100 mg total) by mouth 2 (two) times  daily. 07/21/16   Payton Mccallum, MD  ergocalciferol (DRISDOL) 50000 units capsule Take 1 capsule (50,000 Units total) by mouth once a week. 04/22/16   Sherlene Shams, MD  famotidine (PEPCID) 20 MG tablet TAKE 1 TABLET (20 MG TOTAL) BY MOUTH 2 (TWO) TIMES DAILY. 02/22/16   Sherlene Shams, MD  furosemide (LASIX) 40 MG tablet TAKE 1 TABLET BY MOUTH EVERY DAY AS NEEDED FOR LEG SWELLING AND TO MAINTAIN WEIGHT. 11/14/13   Sherlene Shams, MD  glipiZIDE (GLUCOTROL) 5 MG tablet TAKE 1 TABLET BY MOUTH BEFORE BREAKFAST AND 2 TABLETS BEFORE DINNER 04/21/16   Sherlene Shams, MD  Insulin Detemir (LEVEMIR) 100 UNIT/ML Pen Inject 50 Units into the skin daily at 10 pm. 11/19/15   Sherlene Shams, MD  Insulin Pen Needle 29G X 12.7MM MISC Use as directed 03/16/15   Sherlene Shams, MD  Insulin Syringe-Needle U-100 (INSULIN SYRINGE .5CC/31GX5/16") 31G X 5/16" 0.5 ML MISC Test twice a day 09/15/12   Sherlene Shams, MD  ipratropium-albuterol (DUONEB) 0.5-2.5 (3) MG/3ML SOLN USE 1 VIAL VIA NEBULIZER EVERY 6 HOURS AS NEEDED 11/19/15   Sherlene Shams, MD  latanoprost (XALATAN) 0.005 % ophthalmic solution Place 1 drop into both eyes at bedtime.    Historical Provider, MD  Magnesium 250 MG TABS Take 250 mg by mouth daily.    Historical Provider, MD  NOVOLOG FLEXPEN 100 UNIT/ML FlexPen INJECT 10 UNITS            SUBCUTANEOUSLY 3 TIMES A   DAY BEFORE MEALS. OFFICE   APPOINTMENT NEEDED ! 01/08/16   Sherlene Shams, MD  PARoxetine (PAXIL) 20 MG tablet Take 1 tablet (20 mg total) by mouth daily. TAKE 1 TABLET(10 MG) BY MOUTH EVERY MORNING 07/11/16   Sherlene Shams, MD  Potassium 99 MG TABS Take 99 mg by mouth once. Reported on 09/20/2015    Historical Provider, MD  predniSONE (DELTASONE) 20 MG tablet 3 tabs po qd for 2 days, then 2 tabs po qd for 3 days, then 1 tab po qd for 3 days, then half a tab po qd for 2 days 07/21/16   Payton Mccallum, MD  [facility] HFA 108 [phone] Base) MCG/ACT inhaler USE 2 INHALATIONS ORALLY   EVERY 6 HOURS AS NEEDED Judeth Cornfield  12/14/15   Sherlene Shams, MD  rosuvastatin (CRESTOR) 40 MG tablet Take 1 tablet (40 mg total) by mouth daily. 07/11/16   Sherlene Shams, MD  umeclidinium-vilanterol (ANORO ELLIPTA) 62.5-25 MCG/INH AEPB Inhale 1 puff into the lungs daily. 03/21/16   Shane Crutch, MD    Family History Family History  Problem Relation Age of Onset  . Coronary artery disease Mother 30  . Heart disease Mother     CABG x5  . COPD Mother 25  . Breast cancer Mother   .  Kidney disease Mother   . Heart failure Father   . Angina Father   . Diabetes Father   . Depression Father   . Asthma Brother   . Asthma Brother     Social History Social History  Substance Use Topics  . Smoking status: Former Smoker    Packs/day: 1.00    Years: 40.00    Types: Cigarettes    Quit date: 06/27/2011  . Smokeless tobacco: Never Used  . Alcohol use No     Allergies   Ibuprofen; Oxycodone-acetaminophen; Percocet [oxycodone-acetaminophen]; Sulfa antibiotics; Vicodin [hydrocodone-acetaminophen]; and Aspirin   Review of Systems Review of Systems  Constitutional: Positive for fever.  Respiratory: Positive for cough and wheezing.      Physical Exam Triage Vital Signs ED Triage Vitals  Enc Vitals Group     BP 07/21/16 1821 (!) 125/105     Pulse Rate 07/21/16 1821 86     Resp 07/21/16 1821 16     Temp 07/21/16 1821 98.2 F (36.8 C)     Temp Source 07/21/16 1821 Oral     SpO2 07/21/16 1821 95 %     Weight 07/21/16 1823 272 lb (123.4 kg)     Height 07/21/16 1823 5\' 3"  (1.6 m)     Head Circumference --      Peak Flow --      Pain Score 07/21/16 1824 0     Pain Loc --      Pain Edu? --      Excl. in GC? --    No data found.   Updated Vital Signs BP (!) 125/105 (BP Location: Right Wrist)   Pulse 86   Temp 98.2 F (36.8 C) (Oral)   Resp 16   Ht 5\' 3"  (1.6 m)   Wt 272 lb (123.4 kg)   SpO2 95%   BMI 48.18 kg/m   Visual Acuity Right Eye Distance:   Left Eye Distance:   Bilateral Distance:     Right Eye Near:   Left Eye Near:    Bilateral Near:     Physical Exam  Constitutional: She appears well-developed and well-nourished. No distress.  HENT:  Head: Normocephalic and atraumatic.  Right Ear: Tympanic membrane, external ear and ear canal normal.  Left Ear: Tympanic membrane, external ear and ear canal normal.  Nose: No mucosal edema, rhinorrhea, nose lacerations, sinus tenderness, nasal deformity, septal deviation or nasal septal hematoma. No epistaxis.  No foreign bodies. Right sinus exhibits no maxillary sinus tenderness and no frontal sinus tenderness. Left sinus exhibits no maxillary sinus tenderness and no frontal sinus tenderness.  Mouth/Throat: Uvula is midline, oropharynx is clear and moist and mucous membranes are normal. No oropharyngeal exudate.  Eyes: Conjunctivae and EOM are normal. Pupils are equal, round, and reactive to light. Right eye exhibits no discharge. Left eye exhibits no discharge. No scleral icterus.  Neck: Normal range of motion. Neck supple. No thyromegaly present.  Cardiovascular: Normal rate, regular rhythm and normal heart sounds.   Pulmonary/Chest: Effort normal. No respiratory distress. She has wheezes (mild, expiratory, and diffuse rhonchi). She has no rales.  Lymphadenopathy:    She has no cervical adenopathy.  Skin: She is not diaphoretic.  Nursing note and vitals reviewed.    UC Treatments / Results  Labs (all labs ordered are listed, but only abnormal results are displayed) Labs Reviewed  RAPID STREP SCREEN (NOT AT [facility])  CULTURE, GROUP A STREP [facility])    EKG  EKG Interpretation None  Radiology No results found.  Procedures Procedures (including critical care time)  Medications Ordered in UC Medications - No data to display   Initial Impression / Assessment and Plan / UC Course  I have reviewed the triage vital signs and the nursing notes.  Pertinent labs & imaging results that were available during my care of  the patient were reviewed by me and considered in my medical decision making (see chart for details).  Clinical Course      Final Clinical Impressions(s) / UC Diagnoses   Final diagnoses:  COPD exacerbation (HCC)    New Prescriptions Discharge Medication List as of 07/21/2016  6:57 PM    START taking these medications   Details  doxycycline (VIBRA-TABS) 100 MG tablet Take 1 tablet (100 mg total) by mouth 2 (two) times daily., Starting Mon 07/21/2016, Normal       1. diagnosis reviewed with patient 2. rx as per orders above; reviewed possible side effects, interactions, risks and benefits  3. Recommend continue current home inhalers 4. Follow-up prn if symptoms worsen or don't improve   Payton Mccallumrlando Brenee Gajda, MD 07/21/16 1948

## 2016-07-23 ENCOUNTER — Ambulatory Visit: Payer: Managed Care, Other (non HMO) | Admitting: Internal Medicine

## 2016-07-24 ENCOUNTER — Telehealth: Payer: Self-pay | Admitting: *Deleted

## 2016-07-24 LAB — CULTURE, GROUP A STREP (THRC)

## 2016-07-24 NOTE — Telephone Encounter (Signed)
Called patient, verified DOB, communicated negative strep culture results. Patient reported feeling some better with symptoms persisting. Advised patient to follow up with PCP or [facility] if symptoms become worse.

## 2016-07-28 ENCOUNTER — Telehealth: Payer: Self-pay | Admitting: Internal Medicine

## 2016-07-28 NOTE — Telephone Encounter (Signed)
Called patient back and PFT had been R/S to 08/11/16. Pt requested CT Chest to be scheduled same day. R/S CT Chest High Res for 08/11/16 at 2:30 at [facility]. Pt advised and voiced understanding. Nothing else needed at this time. Rhonda J Cobb

## 2016-07-28 NOTE — Telephone Encounter (Signed)
Pt calling asking if we can call her back to reschedule her CT test for tomorrow.  Please call back

## 2016-07-28 NOTE — Telephone Encounter (Signed)
ATC patient an no answer. LMOAM for pt to return call to R/S CT Chest or that she could conatct central scheduling at [phone] to R/S CT Chest. Left message for pt that if she needed me to return my call to [phone]. Rhonda J Cobb

## 2016-07-29 ENCOUNTER — Ambulatory Visit: Payer: Managed Care, Other (non HMO)

## 2016-08-08 ENCOUNTER — Ambulatory Visit (INDEPENDENT_AMBULATORY_CARE_PROVIDER_SITE_OTHER): Payer: Managed Care, Other (non HMO)

## 2016-08-08 ENCOUNTER — Ambulatory Visit: Payer: Managed Care, Other (non HMO) | Admitting: Internal Medicine

## 2016-08-08 ENCOUNTER — Other Ambulatory Visit: Payer: Self-pay | Admitting: Internal Medicine

## 2016-08-08 VITALS — BP 130/64 | HR 94 | Temp 98.3°F | Resp 16 | Ht 62.0 in | Wt 272.0 lb

## 2016-08-08 DIAGNOSIS — Z0000 Encounter for general adult medical examination without abnormal findings: Secondary | ICD-10-CM

## 2016-08-08 DIAGNOSIS — E785 Hyperlipidemia, unspecified: Secondary | ICD-10-CM | POA: Diagnosis not present

## 2016-08-08 DIAGNOSIS — E118 Type 2 diabetes mellitus with unspecified complications: Secondary | ICD-10-CM

## 2016-08-08 DIAGNOSIS — E1165 Type 2 diabetes mellitus with hyperglycemia: Secondary | ICD-10-CM

## 2016-08-08 DIAGNOSIS — N611 Abscess of the breast and nipple: Secondary | ICD-10-CM | POA: Insufficient documentation

## 2016-08-08 DIAGNOSIS — I10 Essential (primary) hypertension: Secondary | ICD-10-CM

## 2016-08-08 DIAGNOSIS — IMO0002 Reserved for concepts with insufficient information to code with codable children: Secondary | ICD-10-CM

## 2016-08-08 DIAGNOSIS — Z794 Long term (current) use of insulin: Secondary | ICD-10-CM

## 2016-08-08 DIAGNOSIS — E559 Vitamin D deficiency, unspecified: Secondary | ICD-10-CM

## 2016-08-08 LAB — CBC WITH DIFFERENTIAL/PLATELET
BASOS ABS: 0 {cells}/uL (ref 0–200)
Basophils Relative: 0 %
EOS PCT: 0 %
Eosinophils Absolute: 0 cells/uL — ABNORMAL LOW (ref 15–500)
HEMATOCRIT: 41 % (ref 35.0–45.0)
HEMOGLOBIN: 13.6 g/dL (ref 11.7–15.5)
LYMPHS ABS: 2900 {cells}/uL (ref 850–3900)
LYMPHS PCT: 20 %
MCH: 28.7 pg (ref 27.0–33.0)
MCHC: 33.2 g/dL (ref 32.0–36.0)
MCV: 86.5 fL (ref 80.0–100.0)
MONO ABS: 725 {cells}/uL (ref 200–950)
MPV: 9.7 fL (ref 7.5–12.5)
Monocytes Relative: 5 %
NEUTROS PCT: 75 %
Neutro Abs: 10875 cells/uL — ABNORMAL HIGH (ref 1500–7800)
Platelets: 330 10*3/uL (ref 140–400)
RBC: 4.74 MIL/uL (ref 3.80–5.10)
RDW: 13.6 % (ref 11.0–15.0)
WBC: 14.5 10*3/uL — ABNORMAL HIGH (ref 3.8–10.8)

## 2016-08-08 MED ORDER — DOXYCYCLINE HYCLATE 100 MG PO TABS: 100.0000 mg | ORAL_TABLET | Freq: Two times a day (BID) | ORAL | 0 refills | Status: DC

## 2016-08-08 NOTE — Patient Instructions (Addendum)
  Catherine [name] , Thank you for taking time to come for your Medicare Wellness Visit. I appreciate your ongoing commitment to your health goals. Please review the following plan we discussed and let me know if I can assist you in the future.   These are the goals we discussed: Goals    . Increase physical activity          Chair exercises as demonstrated 10 minutes. Increase as tolerated.       This is a list of the screening recommended for you and due dates:  Health Maintenance  Topic Date Due  .  Hepatitis C: One time screening is recommended by Center for Disease Control  (CDC) for  adults born from [birth year] through 1967.   1956/04/18  . HIV Screening  07/30/1969  . Pap Smear  07/31/1975  . Shingles Vaccine  07/30/2014  . Tetanus Vaccine  12/23/2015  . Flu Shot  12/05/2016*  . Hemoglobin A1C  10/22/2016  . Mammogram  11/13/2016  . Pneumococcal vaccine (2) 12/07/2016  . Eye exam for diabetics  01/29/2017  . Urine Protein Check  04/21/2017  . Complete foot exam   07/11/2017  . Colon Cancer Screening  09/25/2020  *Topic was postponed. The date shown is not the original due date.

## 2016-08-08 NOTE — Progress Notes (Signed)
Subjective:   Catherine SquiresDeborah [name] is a 60 y.o. female who presents for Medicare Annual (Subsequent) preventive examination.  Review of Systems:  No ROS.  Medicare Wellness Visit.  Cardiac Risk Factors include: advanced age (>3555men, 94>65 women);diabetes mellitus;hypertension     Objective:     Vitals: BP 130/64 (BP Location: Right Arm, Patient Position: Sitting, Cuff Size: Large)   Pulse 94   Temp 98.3 F (36.8 C) (Oral)   Resp 16   Ht 5\' 2"  (1.575 m)   Wt 272 lb (123.4 kg)   SpO2 96%   BMI 49.75 kg/m   Body mass index is 49.75 kg/m.   Tobacco History  Smoking Status  . Former Smoker  . Packs/day: 1.00  . Years: 40.00  . Types: Cigarettes  . Quit date: 06/27/2011  Smokeless Tobacco  . Never Used     Counseling given: Not Answered   Past Medical History:  Diagnosis Date  . Anxiety   . Asthma   . COPD (chronic obstructive pulmonary disease) (HCC)   . Depression   . Diabetes mellitus   . Gastritis and duodenitis June 2011   EGD  . GERD (gastroesophageal reflux disease)   . Hyperglycemia   . Hyperlipidemia   . Hypertension   . Leukocytosis   . Obesity (BMI 30-39.9)   . S/P cardiac catheterization March 2012   normal coronaries,  due to chest pain ([place])  . Screening for breast cancer Jul 28 2011   normal  . Screening for colon cancer June 2011   polyps found, next one due 2014 ([place])  . Tobacco abuse, in remission    quit oct during hospitalization    Past Surgical History:  Procedure Laterality Date  . ABDOMINAL HYSTERECTOMY    . ABDOMINAL HYSTERECTOMY    . BILATERAL OOPHORECTOMY  1995   and TAH.  noncancerous reasons  . CARDIAC CATHETERIZATION  11/22/2010   no significant disease  . HEMORRHOID SURGERY     Family History  Problem Relation Age of Onset  . Coronary artery disease Mother [id number]  . Heart disease Mother     CABG x5  . COPD Mother [id number]  . Breast cancer Mother   . Kidney disease Mother   . Heart failure Father   . Angina Father     . Diabetes Father   . Depression Father   . Asthma Brother   . Asthma Brother   . Diabetes Brother   . Hypertension Brother   . Cancer Sister     breast, lung, brain   History  Sexual Activity  . Sexual activity: No    Outpatient Encounter Prescriptions as of 08/08/2016  Medication Sig  . ACCU-CHEK SMARTVIEW test strip TEST BLOOD SUGAR FOUR TIMES DAILY  . ALPRAZolam (XANAX) 0.5 MG tablet Take 1 tablet (0.5 mg total) by mouth at bedtime as needed.  Marland Kitchen. aspirin 81 MG EC tablet Take 81 mg by mouth daily.    Marland Kitchen. atorvastatin (LIPITOR) 80 MG tablet TAKE 1 TABLET (80 MG TOTAL) BY MOUTH DAILY.  Marland Kitchen. azithromycin (ZITHROMAX) 250 MG tablet Take 1 tablet every Monday, Wednesday & Friday  . butalbital-acetaminophen-caffeine (FIORICET, ESGIC) 50-325-40 MG tablet TAKE 1 TABLET BY MOUTH EVERY 4 HOURS AS NEEDED FOR HEADACHE  . chlorpheniramine-HYDROcodone (TUSSIONEX PENNKINETIC ER) 10-8 MG/5ML SUER 2.5 ml po qhs prn cough  . docusate sodium (COLACE) 100 MG capsule Take 200 mg by mouth daily. Reported on 09/20/2015  . ergocalciferol (DRISDOL) 50000 units capsule Take 1 capsule (50,000 Units  total) by mouth once a week.  . famotidine (PEPCID) 20 MG tablet TAKE 1 TABLET (20 MG TOTAL) BY MOUTH 2 (TWO) TIMES DAILY.  . furosemide (LASIX) 40 MG tablet TAKE 1 TABLET BY MOUTH EVERY DAY AS NEEDED FOR LEG SWELLING AND TO MAINTAIN WEIGHT.  Marland Kitchen. glipiZIDE (GLUCOTROL) 5 MG tablet TAKE 1 TABLET BY MOUTH BEFORE BREAKFAST AND 2 TABLETS BEFORE DINNER  . Insulin Detemir (LEVEMIR) 100 UNIT/ML Pen Inject 50 Units into the skin daily at 10 pm.  . Insulin Pen Needle 29G X 12.7MM MISC Use as directed  . Insulin Syringe-Needle U-100 (INSULIN SYRINGE .5CC/31GX5/16") 31G X 5/16" 0.5 ML MISC Test twice a day  . ipratropium-albuterol (DUONEB) 0.5-2.5 (3) MG/3ML SOLN USE 1 VIAL VIA NEBULIZER EVERY 6 HOURS AS NEEDED  . latanoprost (XALATAN) 0.005 % ophthalmic solution Place 1 drop into both eyes at bedtime.  . Magnesium 250 MG TABS Take  250 mg by mouth daily.  Marland Kitchen. NOVOLOG FLEXPEN 100 UNIT/ML FlexPen INJECT 10 UNITS            SUBCUTANEOUSLY 3 TIMES A   DAY BEFORE MEALS. OFFICE   APPOINTMENT NEEDED !  Marland Kitchen. PARoxetine (PAXIL) 20 MG tablet Take 1 tablet (20 mg total) by mouth daily. TAKE 1 TABLET(10 MG) BY MOUTH EVERY MORNING  . Potassium 99 MG TABS Take 99 mg by mouth once. Reported on 09/20/2015  . predniSONE (DELTASONE) 20 MG tablet 3 tabs po qd for 2 days, then 2 tabs po qd for 3 days, then 1 tab po qd for 3 days, then half a tab po qd for 2 days  . PROAIR HFA 108 (90 Base) MCG/ACT inhaler USE 2 INHALATIONS ORALLY   EVERY 6 HOURS AS NEEDED FORWHEEZING  . rosuvastatin (CRESTOR) 40 MG tablet Take 1 tablet (40 mg total) by mouth daily.  Marland Kitchen. umeclidinium-vilanterol (ANORO ELLIPTA) 62.5-25 MCG/INH AEPB Inhale 1 puff into the lungs daily.  . [DISCONTINUED] doxycycline (VIBRA-TABS) 100 MG tablet Take 1 tablet (100 mg total) by mouth 2 (two) times daily.   No facility-administered encounter medications on file as of 08/08/2016.     Activities of Daily Living In your present state of health, do you have any difficulty performing the following activities: 08/08/2016  Hearing? N  Vision? N  Difficulty concentrating or making decisions? N  Walking or climbing stairs? Y  Dressing or bathing? Y  Doing errands, shopping? N  Preparing Food and eating ? N  Using the Toilet? N  In the past six months, have you accidently leaked urine? Y  Do you have problems with loss of bowel control? N  Managing your Medications? N  Managing your Finances? N  Housekeeping or managing your Housekeeping? Y  Some recent data might be hidden    Patient Care Team: Sherlene Shamseresa L [name], MD as PCP - General (Internal Medicine)    Assessment:    This is a routine wellness examination for Catherine [name]. The goal of the wellness visit is to assist the patient how to close the gaps in care and create a preventative care plan for the patient.   Taking calcium VIT D as  appropriate/Osteoporosis risk reviewed.  Medications reviewed; taking without issues or barriers.  Safety issues reviewed; smoke detectors in the home. No firearms in the home. Wears seatbelts when driving or riding with others. No violence in the home.  No identified risk were noted; The patient was oriented x 3; appropriate in dress and manner and no objective failures at ADL's or IADL's.  BMI; discussed the importance of a healthy diet, water intake and exercise. Educational material provided.  Labs completed.  Patient Concerns: Tenderness and discharge from [facility]; MM scheduled. Deferred to PCP for follow up.  Exercise Activities and Dietary recommendations Current Exercise Habits: Home exercise routine (Chair exercises), Time (Minutes): 10, Frequency (Times/Week): 1, Weekly Exercise (Minutes/Week): 10, Intensity: Mild  Goals    . Increase physical activity          Chair exercises as demonstrated 10 minutes. Increase as tolerated.      Fall Risk Fall Risk  08/08/2016 09/20/2015  Falls in the past year? No No   Depression Screen PHQ 2/9 Scores 08/08/2016 09/20/2015 09/07/2012  PHQ - 2 Score 0 0 2  PHQ- 9 Score - - 4     Cognitive Function     6CIT Screen 08/08/2016  What Year? 0 points  What month? 0 points  What time? 0 points  Count back from 20 0 points  Months in reverse 0 points  Repeat phrase 0 points  Total Score 0    Immunization History  Administered Date(s) Administered  . Hepatitis B 01/21/2012  . Influenza Whole 07/10/2011, 08/31/2012  . Influenza,inj,Quad PF,36+ Mos 06/12/2014  . Pneumococcal Conjugate-13 06/12/2014  . Pneumococcal Polysaccharide-23 12/08/2011  . Tdap 12/22/2005   Screening Tests Health Maintenance  Topic Date Due  . Hepatitis C Screening  06-20-1956  . HIV Screening  07/30/1969  . PAP SMEAR  07/31/1975  . ZOSTAVAX  07/30/2014  . TETANUS/TDAP  12/23/2015  . INFLUENZA VACCINE  12/05/2016 (Originally 04/08/2016)  .  HEMOGLOBIN A1C  10/22/2016  . MAMMOGRAM  11/13/2016  . PNEUMOCOCCAL POLYSACCHARIDE VACCINE (2) 12/07/2016  . OPHTHALMOLOGY EXAM  01/29/2017  . URINE MICROALBUMIN  04/21/2017  . FOOT EXAM  07/11/2017  . COLONOSCOPY  09/25/2020      Plan:    End of life planning; Advance aging; Advanced directives discussed. No HCPOA/Living Will.  Additional information declined at this time.    Medicare Attestation I have personally reviewed: The patient's medical and social history Their use of alcohol, tobacco or illicit drugs Their current medications and supplements The patient's functional ability including ADLs,fall risks, home safety risks, cognitive, and hearing and visual impairment Diet and physical activities Evidence for depression   The patient's weight, height, BMI, and visual acuity have been recorded in the chart.  I have made referrals and provided education to the patient based on review of the above and I have provided the patient with a written personalized care plan for preventive services.    During the course of the visit the patient was educated and counseled about the following appropriate screening and preventive services:   Vaccines to include Pneumoccal, Influenza, Hepatitis B, Td, Zostavax, HCV  Electrocardiogram  Cardiovascular Disease  Colorectal cancer screening  Bone density screening  Diabetes screening  Glaucoma screening  Mammography/PAP  Nutrition counseling   Patient Instructions (the written plan) was given to the patient.   Ashok Pall, LPN  16/09/958

## 2016-08-09 LAB — COMPREHENSIVE METABOLIC PANEL
ALBUMIN: 3.9 g/dL (ref 3.6–5.1)
ALT: 27 U/L (ref 6–29)
AST: 26 U/L (ref 10–35)
Alkaline Phosphatase: 85 U/L (ref 33–130)
BUN: 16 mg/dL (ref 7–25)
CALCIUM: 8.9 mg/dL (ref 8.6–10.4)
CHLORIDE: 100 mmol/L (ref 98–110)
CO2: 30 mmol/L (ref 20–31)
Creat: 0.79 mg/dL (ref 0.50–0.99)
Glucose, Bld: 164 mg/dL — ABNORMAL HIGH (ref 65–99)
Potassium: 4.3 mmol/L (ref 3.5–5.3)
Sodium: 141 mmol/L (ref 135–146)
Total Bilirubin: 0.3 mg/dL (ref 0.2–1.2)
Total Protein: 6.8 g/dL (ref 6.1–8.1)

## 2016-08-09 LAB — LIPID PANEL
CHOL/HDL RATIO: 2.6 ratio (ref <5.0)
CHOLESTEROL: 164 mg/dL (ref <200)
HDL: 64 mg/dL (ref >50)
LDL Cholesterol: 75 mg/dL (ref <100)
TRIGLYCERIDES: 125 mg/dL (ref <150)
VLDL: 25 mg/dL (ref <30)

## 2016-08-09 LAB — MICROALBUMIN / CREATININE URINE RATIO
CREATININE, URINE: 65 mg/dL (ref 20–320)
Microalb, Ur: <0.2 mg/dL

## 2016-08-09 LAB — HEMOGLOBIN A1C
Hgb A1c MFr Bld: 8.5 % — ABNORMAL HIGH (ref <5.7)
MEAN PLASMA GLUCOSE: 197 mg/dL

## 2016-08-09 LAB — VITAMIN D 25 HYDROXY (VIT D DEFICIENCY, FRACTURES): Vit D, 25-Hydroxy: 31 ng/mL (ref 30–100)

## 2016-08-11 ENCOUNTER — Ambulatory Visit
Admission: RE | Admit: 2016-08-11 | Discharge: 2016-08-11 | Disposition: A | Discharge status: Home / Self Care | Payer: Managed Care, Other (non HMO) | Source: Ambulatory Visit | Attending: Internal Medicine | Admitting: Internal Medicine

## 2016-08-11 DIAGNOSIS — I2510 Atherosclerotic heart disease of native coronary artery without angina pectoris: Secondary | ICD-10-CM | POA: Insufficient documentation

## 2016-08-11 DIAGNOSIS — J432 Centrilobular emphysema: Secondary | ICD-10-CM | POA: Insufficient documentation

## 2016-08-11 DIAGNOSIS — J849 Interstitial pulmonary disease, unspecified: Secondary | ICD-10-CM | POA: Diagnosis not present

## 2016-08-11 DIAGNOSIS — K760 Fatty (change of) liver, not elsewhere classified: Secondary | ICD-10-CM | POA: Diagnosis not present

## 2016-08-11 DIAGNOSIS — R918 Other nonspecific abnormal finding of lung field: Secondary | ICD-10-CM | POA: Diagnosis not present

## 2016-08-12 NOTE — Progress Notes (Signed)
  I have reviewed the above information and agree with above.   Teresa [name], MD 

## 2016-08-19 ENCOUNTER — Ambulatory Visit
Admission: RE | Admit: 2016-08-19 | Discharge: 2016-08-19 | Disposition: A | Discharge status: Home / Self Care | Payer: Managed Care, Other (non HMO) | Source: Ambulatory Visit | Attending: Internal Medicine | Admitting: Internal Medicine

## 2016-08-19 DIAGNOSIS — Z1231 Encounter for screening mammogram for malignant neoplasm of breast: Secondary | ICD-10-CM | POA: Diagnosis not present

## 2016-08-19 DIAGNOSIS — Z1239 Encounter for other screening for malignant neoplasm of breast: Secondary | ICD-10-CM

## 2016-08-20 ENCOUNTER — Other Ambulatory Visit: Payer: Self-pay | Admitting: Internal Medicine

## 2016-08-20 DIAGNOSIS — R928 Other abnormal and inconclusive findings on diagnostic imaging of breast: Secondary | ICD-10-CM

## 2016-08-21 ENCOUNTER — Ambulatory Visit: Payer: Managed Care, Other (non HMO) | Admitting: Internal Medicine

## 2016-08-21 ENCOUNTER — Other Ambulatory Visit: Payer: Self-pay | Admitting: Internal Medicine

## 2016-08-22 ENCOUNTER — Other Ambulatory Visit: Payer: Self-pay | Admitting: Internal Medicine

## 2016-08-22 ENCOUNTER — Other Ambulatory Visit: Payer: Self-pay

## 2016-08-22 DIAGNOSIS — J432 Centrilobular emphysema: Secondary | ICD-10-CM

## 2016-08-22 DIAGNOSIS — J449 Chronic obstructive pulmonary disease, unspecified: Secondary | ICD-10-CM

## 2016-08-25 ENCOUNTER — Ambulatory Visit (INDEPENDENT_AMBULATORY_CARE_PROVIDER_SITE_OTHER): Payer: Managed Care, Other (non HMO) | Admitting: Internal Medicine

## 2016-08-25 DIAGNOSIS — N611 Abscess of the breast and nipple: Secondary | ICD-10-CM

## 2016-08-25 MED ORDER — FLUCONAZOLE 150 MG PO TABS: 150.0000 mg | ORAL_TABLET | Freq: Every day | ORAL | 0 refills | Status: DC

## 2016-08-25 MED ORDER — DOXYCYCLINE HYCLATE 100 MG PO CAPS: 100.0000 mg | ORAL_CAPSULE | Freq: Two times a day (BID) | ORAL | 0 refills | Status: DC

## 2016-08-25 NOTE — Progress Notes (Signed)
Pre-visit discussion using our clinic review tool. No additional management support is needed unless otherwise documented below in the visit note.  

## 2016-08-25 NOTE — Progress Notes (Signed)
Subjective:  Patient ID: Catherine [name], female    DOB: [birth year]  Age: 60 y.o. MRN: [id number]  CC: The encounter diagnosis was Left breast abscess.  HPI Catherine [name] presents for evaluation of left breast abscess ,  Was treated with doxycycline  And redness and swelling improved but did not completely resolved.  Has been having spontaneous  bloody drainage forn area up until 3 days ago    Outpatient Medications Prior to Visit  Medication Sig Dispense Refill  . ACCU-CHEK SMARTVIEW test strip TEST BLOOD SUGAR FOUR TIMES DAILY 100 each 12  . ALPRAZolam (XANAX) 0.5 MG tablet Take 1 tablet (0.5 mg total) by mouth at bedtime as needed. 30 tablet 5  . aspirin 81 MG EC tablet Take 81 mg by mouth daily.      Marland Kitchen azithromycin (ZITHROMAX) 250 MG tablet Take 1 tablet every Monday, Wednesday & Friday 13 tablet 11  . butalbital-acetaminophen-caffeine (FIORICET, ESGIC) 50-325-40 MG tablet TAKE 1 TABLET BY MOUTH EVERY 4 HOURS AS NEEDED FOR HEADACHE 30 tablet 3  . chlorpheniramine-HYDROcodone (TUSSIONEX PENNKINETIC ER) 10-8 MG/5ML SUER 2.5 ml po qhs prn cough 100 mL 0  . docusate sodium (COLACE) 100 MG capsule Take 200 mg by mouth daily. Reported on 09/20/2015    . ergocalciferol (DRISDOL) 50000 units capsule Take 1 capsule (50,000 Units total) by mouth once a week. 12 capsule 3  . famotidine (PEPCID) 20 MG tablet TAKE 1 TABLET (20 MG TOTAL) BY MOUTH 2 (TWO) TIMES DAILY. 180 tablet 1  . furosemide (LASIX) 40 MG tablet TAKE 1 TABLET BY MOUTH EVERY DAY AS NEEDED FOR LEG SWELLING AND TO MAINTAIN WEIGHT. 30 tablet 5  . glipiZIDE (GLUCOTROL) 5 MG tablet TAKE 1 TABLET BY MOUTH BEFORE BREAKFAST AND 2 TABLETS BEFORE DINNER 270 tablet 2  . Insulin Detemir (LEVEMIR) 100 UNIT/ML Pen Inject 50 Units into the skin daily at 10 pm. 45 mL 3  . Insulin Pen Needle 29G X 12.7MM MISC Use as directed 200 each 1  . Insulin Syringe-Needle U-100 (INSULIN SYRINGE .5CC/31GX5/16") 31G X 5/16" 0.5 ML MISC Test twice a day 100  each 11  . ipratropium-albuterol (DUONEB) 0.5-2.5 (3) MG/3ML SOLN USE 1 VIAL VIA NEBULIZER EVERY 6 HOURS AS NEEDED 1080 mL 3  . latanoprost (XALATAN) 0.005 % ophthalmic solution Place 1 drop into both eyes at bedtime.    . Magnesium 250 MG TABS Take 250 mg by mouth daily.    Marland Kitchen NOVOLOG FLEXPEN 100 UNIT/ML FlexPen INJECT 10 UNITS            SUBCUTANEOUSLY 3 TIMES A   DAY BEFORE MEALS. OFFICE   APPOINTMENT NEEDED ! 90 mL 2  . PARoxetine (PAXIL) 20 MG tablet Take 1 tablet (20 mg total) by mouth daily. TAKE 1 TABLET(10 MG) BY MOUTH EVERY MORNING 90 tablet 1  . Potassium 99 MG TABS Take 99 mg by mouth once. Reported on 09/20/2015    . PROAIR HFA 108 (90 Base) MCG/ACT inhaler USE 2 INHALATIONS ORALLY   EVERY 6 HOURS AS NEEDED FORWHEEZING 17 g 1  . rosuvastatin (CRESTOR) 40 MG tablet Take 1 tablet (40 mg total) by mouth daily. 90 tablet 1  . umeclidinium-vilanterol (ANORO ELLIPTA) 62.5-25 MCG/INH AEPB Inhale 1 puff into the lungs daily. 90 each 3  . atorvastatin (LIPITOR) 80 MG tablet TAKE 1 TABLET (80 MG TOTAL) BY MOUTH DAILY. (Patient not taking: Reported on 08/25/2016) 90 tablet 1  . doxycycline (VIBRA-TABS) 100 MG tablet Take 1 tablet (100 mg total)  by mouth 2 (two) times daily. (Patient not taking: Reported on 08/25/2016) 14 tablet 0  . predniSONE (DELTASONE) 20 MG tablet 3 tabs po qd for 2 days, then 2 tabs po qd for 3 days, then 1 tab po qd for 3 days, then half a tab po qd for 2 days (Patient not taking: Reported on 08/25/2016) 16 tablet 0   No facility-administered medications prior to visit.     Review of Systems;  Patient denies headache, fevers, malaise, unintentional weight loss, skin rash, eye pain, sinus congestion and sinus pain, sore throat, dysphagia,  hemoptysis , cough, dyspnea, wheezing, chest pain, palpitations, orthopnea, edema, abdominal pain, nausea, melena, diarrhea, constipation, flank pain, dysuria, hematuria, urinary  Frequency, nocturia, numbness, tingling, seizures,  Focal  weakness, Loss of consciousness,  Tremor, insomnia, depression, anxiety, and suicidal ideation.      Objective:  BP 136/78   Pulse 88   Temp 98 F (36.7 C) (Oral)   Resp 14   Ht 5\' 3"  (1.6 m)   Wt 275 lb 8 oz (125 kg)   SpO2 98% Comment: o2 tank  BMI 48.80 kg/m   BP Readings from Last 3 Encounters:  08/25/16 136/78  08/08/16 130/64  07/21/16 (!) 125/105    Wt Readings from Last 3 Encounters:  08/25/16 275 lb 8 oz (125 kg)  08/08/16 272 lb (123.4 kg)  07/21/16 272 lb (123.4 kg)   General appearance: alert, cooperative and appears stated age Neck: no adenopathy, no carotid bruit, no JVD, supple, symmetrical, trachea midline and thyroid not enlarged, symmetric, no tenderness/mass/nodules Lungs: clear to auscultation bilaterally Breasts: pendulous breast,  Small resolving abscess on the underside of left medial breast, not fluctuant or currently draining  heart: regular rate and rhythm, S1, S2 normal, no murmur, click, rub or gallop  Lab Results  Component Value Date   HGBA1C 8.5 (H) 08/08/2016   HGBA1C 7.6 (H) 04/21/2016   HGBA1C 8.5 (H) 11/19/2015    Lab Results  Component Value Date   CREATININE 0.79 08/08/2016   CREATININE 0.76 04/21/2016   CREATININE 0.64 11/19/2015    Lab Results  Component Value Date   WBC 14.5 (H) 08/08/2016   HGB 13.6 08/08/2016   HCT 41.0 08/08/2016   PLT 330 08/08/2016   GLUCOSE 164 (H) 08/08/2016   CHOL 164 08/08/2016   TRIG 125 08/08/2016   HDL 64 08/08/2016   LDLDIRECT 155.0 04/21/2016   LDLCALC 75 08/08/2016   ALT 27 08/08/2016   AST 26 08/08/2016   NA 141 08/08/2016   K 4.3 08/08/2016   CL 100 08/08/2016   CREATININE 0.79 08/08/2016   BUN 16 08/08/2016   CO2 30 08/08/2016   TSH 0.628 01/04/2012   INR 0.9 07/22/2012   HGBA1C 8.5 (H) 08/08/2016   MICROALBUR <0.2 08/08/2016    Mm Digital Screening Bilateral  Result Date: 08/19/2016 CLINICAL DATA:  Screening. EXAM: DIGITAL SCREENING BILATERAL MAMMOGRAM WITH CAD  COMPARISON:  Previous exam(s). ACR Breast Density Category b: There are scattered areas of fibroglandular density. FINDINGS: In the right breast, a possible mass warrants further evaluation. In the left breast, no findings suspicious for malignancy. Images were processed with CAD. IMPRESSION: Further evaluation is suggested for possible mass in the right breast. RECOMMENDATION: Diagnostic mammogram and possibly ultrasound of the right breast. (Code:[id number]) The patient will be contacted regarding the findings, and additional imaging will be scheduled. BI-RADS CATEGORY  0: Incomplete. Need additional imaging evaluation and/or prior mammograms for comparison. Electronically Signed   By:  Gerome Samavid  Williams III M.D   On: 08/19/2016 17:08    Assessment & Plan:   Problem List Items Addressed This Visit    Left breast abscess    Improving, but still draining purulent bloody fluid as of 3 days ago,  Will repeat round of antibiotics with doxycycline for one week.  If no resolution,  Referral to Gen Surgery          I am having Ms. Windom start on doxycycline and fluconazole. I am also having her maintain her aspirin, docusate sodium, INSULIN SYRINGE .5CC/31GX5/16", ACCU-CHEK SMARTVIEW, furosemide, latanoprost, Insulin Pen Needle, Potassium, Magnesium, Insulin Detemir, ipratropium-albuterol, PROAIR HFA, NOVOLOG FLEXPEN, azithromycin, butalbital-acetaminophen-caffeine, famotidine, umeclidinium-vilanterol, glipiZIDE, ergocalciferol, atorvastatin, rosuvastatin, PARoxetine, ALPRAZolam, predniSONE, chlorpheniramine-HYDROcodone, and doxycycline.  Meds ordered this encounter  Medications  . doxycycline (VIBRAMYCIN) 100 MG capsule    Sig: Take 1 capsule (100 mg total) by mouth 2 (two) times daily.    Dispense:  14 capsule    Refill:  0  . fluconazole (DIFLUCAN) 150 MG tablet    Sig: Take 1 tablet (150 mg total) by mouth daily.    Dispense:  2 tablet    Refill:  0    There are no discontinued  medications.  Follow-up: No Follow-up on file.   Sherlene ShamsULLO, Nithin Demeo L, MD

## 2016-08-25 NOTE — Patient Instructions (Signed)
I am prescribing doxycycline for the breast abscess . For one more week  Apply hot compresses to the area for 15 minutes several times daily  Keep a gauze pad between the breast and chest wall to catch the drainage   If this does not resolve ,  Gen Surg referral for Incision and drainage  Fluconazole sent to pharmacy for prn use for yeast infection

## 2016-08-26 NOTE — Assessment & Plan Note (Addendum)
Improving, but still draining purulent bloody fluid as of 3 days ago,  Will repeat round of antibiotics with doxycycline for one week.  If no resolution,  Referral to Gen Surgery

## 2016-09-11 ENCOUNTER — Other Ambulatory Visit: Payer: Managed Care, Other (non HMO)

## 2016-09-11 ENCOUNTER — Ambulatory Visit: Payer: Managed Care, Other (non HMO)

## 2016-09-16 ENCOUNTER — Ambulatory Visit: Payer: Managed Care, Other (non HMO) | Admitting: Internal Medicine

## 2016-09-18 ENCOUNTER — Ambulatory Visit: Payer: Commercial Managed Care - PPO | Attending: Internal Medicine

## 2016-09-18 DIAGNOSIS — J449 Chronic obstructive pulmonary disease, unspecified: Secondary | ICD-10-CM | POA: Diagnosis not present

## 2016-09-18 MED ORDER — ALBUTEROL SULFATE (2.5 MG/3ML) 0.083% IN NEBU
2.5000 mg | INHALATION_SOLUTION | Freq: Once | RESPIRATORY_TRACT | Status: AC
  Administered 2016-09-18: 2.5 mg via RESPIRATORY_TRACT
  Filled 2016-09-18: qty 3

## 2016-10-05 NOTE — Progress Notes (Signed)
[facility]* [facility]     Assessment and Plan:  Severe emphysema, with COPD exacerbation and/or severe persistent asthma.  -Continue Anoro, DuoNeb's. -Continue on azithromycin 250 mg to be taken 3 days weekly. --Review of previous CBC shows eos have ranged from 0-4.4%.  --CT chest showed no evidence of significant pulmonary disease.  --PFT 09/18/16, FEV1 equals 34%, with reversibility.   Chronic respiratory failure. -Patient has hypoxemic respiratory failure, chronic. Continue oxygen, ambulatory, pulse dosing. --Is interested in switching her portable tank to a portable concentrator, will place order.    Obstructive sleep apnea. -intolerant of CPAP  Obesity --BMI=47 --Discussed that weight loss may be beneficial for her breathing.  --Asked that she try to increase her physical activity level.   Date: 10/05/2016  MRN# [id number] Catherine [name] Apr 18, 1956   Catherine [name] is a 61 y.o. old female seen in follow up for chief complaint of  Chief Complaint  Patient presents with  . Follow-up    CT 08-11-16. pt states breathing has worsen since last OV. pt c/o increased sob, non prod cough & mild wheezing     HPI:   The patient is a 61 year old female with a history of severe emphysema. She has had multiple exacerbations and multiple course of prednisone.. At last visit she was asked to continue Anoro, duonebs, oxygen, Cpap, and started on azithro 3 days every M, W, F, at that time she  Was having multiple exacerbations.   Since her last visit she has been taking azithro as directed, she has not been back to the hospital, she has not had any further COPD exacerbations.  She continues on oxygen at 4L pulse dose when out of the house, at home she is on 4L constant.  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  She is using anoro once every morning, she is doing duoneb about 3-4 times per day, proair as needed.   She is  no longer on CPAP due to intolerance.   chest x-ray 01/02/16, CT chest from 01/10/16: Trace chronic bronchitis, no significant emphysematous changes seen. Interstitial changes seen on the most recent chest x-ray, however, these were not seen on the previous CT image  Images reviewed, CT chest 08/11/16: Lungs are unremarkable other than some streaky atelectasis in the left lung,  Reviewed: Pulmonary function testing 09/18/16. FVC was 83% of predicted, FEV1 is 34% predicted, ratio was 37%. TLC 70%, ERV is 17%, RV to TLC ratio is elevated. DLCO was 41%. Overall, this appears consistent with severe emphysema with reversibility.    Medication:   Outpatient Encounter Prescriptions as of 10/06/2016  Medication Sig  . ACCU-CHEK SMARTVIEW test strip TEST BLOOD SUGAR FOUR TIMES DAILY  . ALPRAZolam (XANAX) 0.5 MG tablet Take 1 tablet (0.5 mg total) by mouth at bedtime as needed.  Marland Kitchen. aspirin 81 MG EC tablet Take 81 mg by mouth daily.    Marland Kitchen. atorvastatin (LIPITOR) 80 MG tablet TAKE 1 TABLET (80 MG TOTAL) BY MOUTH DAILY.  Marland Kitchen. azithromycin (ZITHROMAX) 250 MG tablet Take 1 tablet every Monday, Wednesday & Friday  . butalbital-acetaminophen-caffeine (FIORICET, ESGIC) 50-325-40 MG tablet TAKE 1 TABLET BY MOUTH EVERY 4 HOURS AS NEEDED FOR HEADACHE  . chlorpheniramine-HYDROcodone (TUSSIONEX PENNKINETIC ER) 10-8 MG/5ML SUER 2.5 ml po qhs prn cough  . docusate sodium (COLACE) 100 MG capsule Take 200 mg by mouth daily. Reported on 09/20/2015  . doxycycline (VIBRA-TABS) 100 MG tablet Take 1 tablet (100 mg total) by mouth  2 (two) times daily.  Marland Kitchen doxycycline (VIBRAMYCIN) 100 MG capsule Take 1 capsule (100 mg total) by mouth 2 (two) times daily.  . ergocalciferol (DRISDOL) 50000 units capsule Take 1 capsule (50,000 Units total) by mouth once a week.  . famotidine (PEPCID) 20 MG tablet TAKE 1 TABLET (20 MG TOTAL) BY MOUTH 2 (TWO) TIMES DAILY.  . fluconazole (DIFLUCAN) 150 MG tablet Take 1 tablet (150 mg total) by mouth daily.    . furosemide (LASIX) 40 MG tablet TAKE 1 TABLET BY MOUTH EVERY DAY AS NEEDED FOR LEG SWELLING AND TO MAINTAIN WEIGHT.  Marland Kitchen glipiZIDE (GLUCOTROL) 5 MG tablet TAKE 1 TABLET BY MOUTH BEFORE BREAKFAST AND 2 TABLETS BEFORE DINNER  . Insulin Detemir (LEVEMIR) 100 UNIT/ML Pen Inject 50 Units into the skin daily at 10 pm.  . Insulin Pen Needle 29G X 12.7MM MISC Use as directed  . Insulin Syringe-Needle U-100 (INSULIN SYRINGE .5CC/31GX5/16") 31G X 5/16" 0.5 ML MISC Test twice a day  . ipratropium-albuterol (DUONEB) 0.5-2.5 (3) MG/3ML SOLN USE 1 VIAL VIA NEBULIZER EVERY 6 HOURS AS NEEDED  . latanoprost (XALATAN) 0.005 % ophthalmic solution Place 1 drop into both eyes at bedtime.  . Magnesium 250 MG TABS Take 250 mg by mouth daily.  Marland Kitchen NOVOLOG FLEXPEN 100 UNIT/ML FlexPen INJECT 10 UNITS            SUBCUTANEOUSLY 3 TIMES A   DAY BEFORE MEALS. OFFICE   APPOINTMENT NEEDED !  Marland Kitchen PARoxetine (PAXIL) 20 MG tablet Take 1 tablet (20 mg total) by mouth daily. TAKE 1 TABLET(10 MG) BY MOUTH EVERY MORNING  . Potassium 99 MG TABS Take 99 mg by mouth once. Reported on 09/20/2015  . predniSONE (DELTASONE) 20 MG tablet 3 tabs po qd for 2 days, then 2 tabs po qd for 3 days, then 1 tab po qd for 3 days, then half a tab po qd for 2 days (Patient not taking: Reported on 08/25/2016)  . PROAIR HFA 108 (90 Base) MCG/ACT inhaler USE 2 INHALATIONS ORALLY   EVERY 6 HOURS AS NEEDED FORWHEEZING  . rosuvastatin (CRESTOR) 40 MG tablet Take 1 tablet (40 mg total) by mouth daily.  Marland Kitchen umeclidinium-vilanterol (ANORO ELLIPTA) 62.5-25 MCG/INH AEPB Inhale 1 puff into the lungs daily.   No facility-administered encounter medications on file as of 10/06/2016.      Allergies:  Ibuprofen; Oxycodone-acetaminophen; Percocet [oxycodone-acetaminophen]; Sulfa antibiotics; Vicodin [hydrocodone-acetaminophen]; and Aspirin  Review of Systems: Gen:  Denies  fever, sweats. HEENT: Denies blurred vision. Cvc:  No dizziness, chest pain or heaviness Resp:    Denies cough or sputum porduction. Gi: Denies swallowing difficulty, stomach pain.  Gu:  Denies bladder incontinence, burning urine Ext:   No Joint pain, stiffness. Skin: No skin rash, easy bruising. Endoc:  No polyuria, polydipsia. Psych: No depression, insomnia. Other:  All other systems were reviewed and found to be negative other than what is mentioned in the HPI.   Physical Examination:   VS: BP 138/78 (BP Location: Left Arm, Cuff Size: Normal)   Pulse 91   Ht 5\' 3"  (1.6 m)   Wt 274 lb 12.8 oz (124.6 kg)   SpO2 91%   BMI 48.68 kg/m   General Appearance: No distress  Neuro:without focal findings,  speech normal,  HEENT: PERRLA, EOM intact. Pulmonary: normal breath sounds, decreased air entry bilaterally. CardiovascularNormal S1,S2.  No m/r/g.   Abdomen: Benign, Soft, non-tender. Renal:  No costovertebral tenderness  GU:  Not performed at this time. Endoc: No evident thyromegaly,  no signs of acromegaly. Skin:   warm, no rash. Extremities: normal, no cyanosis, clubbing.   LABORATORY PANEL:   CBC No results for input(s): WBC, HGB, HCT, PLT in the last 168 hours. ------------------------------------------------------------------------------------------------------------------  Chemistries  No results for input(s): NA, K, CL, CO2, GLUCOSE, BUN, CREATININE, CALCIUM, MG, AST, ALT, ALKPHOS, BILITOT in the last 168 hours.  Invalid input(s): GFRCGP ------------------------------------------------------------------------------------------------------------------  Cardiac Enzymes No results for input(s): TROPONINI in the last 168 hours. ------------------------------------------------------------  RADIOLOGY:   No results found for this or any previous visit. Results for orders placed during the hospital encounter of 01/02/16  DG Chest 2 View   Narrative CLINICAL DATA:  Positive flu test 3 days ago, cough, chest congestion, shortness of breath, and fever since then,  history of asthma-COPD, coronary artery disease, former smoker,  EXAM: CHEST  2 VIEW  COMPARISON:  Chest x-ray of September 04, 2015  FINDINGS: The lungs are well-expanded. The interstitial markings are increased bilaterally. There is no alveolar infiltrate. There is no pleural effusion. The heart and pulmonary vascularity are normal. The bony thorax exhibits no acute abnormality.  IMPRESSION: Increased interstitial markings bilaterally over baseline likely reflects acute bronchitic changes. Mild chronic hyperinflation consistent with known asthma-COPD. There is no alveolar pneumonia nor CHF.   Electronically Signed   By: David  [place] M.D.   On: 01/02/2016 16:05    ------------------------------------------------------------------------------------------------------------------  Thank  you for allowing [facility] to assist in the care of your patient. Our recommendations are noted above.  Please contact us if we can be of further service.   Wells Guiles, MD.  [facility] Office Number: [phone]  Santiago Glad, M.D.  Stephanie Acre, M.D.  Billy Fischer, M.D  10/05/2016

## 2016-10-06 ENCOUNTER — Ambulatory Visit (INDEPENDENT_AMBULATORY_CARE_PROVIDER_SITE_OTHER): Payer: Commercial Managed Care - PPO | Admitting: Internal Medicine

## 2016-10-06 ENCOUNTER — Encounter: Payer: Self-pay | Admitting: Internal Medicine

## 2016-10-06 VITALS — BP 138/78 | HR 91 | Ht 63.0 in | Wt 274.8 lb

## 2016-10-06 DIAGNOSIS — J432 Centrilobular emphysema: Secondary | ICD-10-CM

## 2016-10-06 DIAGNOSIS — G4733 Obstructive sleep apnea (adult) (pediatric): Secondary | ICD-10-CM

## 2016-10-06 DIAGNOSIS — Z23 Encounter for immunization: Secondary | ICD-10-CM | POA: Diagnosis not present

## 2016-10-06 NOTE — Addendum Note (Signed)
Addended by: Maxwell MarionBLANKENSHIP, MARGIE A on: 10/06/2016 12:28 PM   Modules accepted: Orders

## 2016-10-06 NOTE — Patient Instructions (Addendum)
--  Can switch to portable oxygen concentrator at 4L pulse dosing.   --Will give flu shot today.   --Increase your activity level.

## 2016-10-20 ENCOUNTER — Ambulatory Visit: Payer: Managed Care, Other (non HMO)

## 2016-10-20 ENCOUNTER — Other Ambulatory Visit: Payer: Managed Care, Other (non HMO)

## 2016-10-31 ENCOUNTER — Ambulatory Visit
Admission: RE | Admit: 2016-10-31 | Discharge: 2016-10-31 | Disposition: A | Discharge status: Home / Self Care | Payer: Commercial Managed Care - PPO | Source: Ambulatory Visit | Attending: Internal Medicine | Admitting: Internal Medicine

## 2016-10-31 DIAGNOSIS — R928 Other abnormal and inconclusive findings on diagnostic imaging of breast: Secondary | ICD-10-CM | POA: Diagnosis present

## 2016-10-31 DIAGNOSIS — N6001 Solitary cyst of right breast: Secondary | ICD-10-CM | POA: Diagnosis not present

## 2016-10-31 DIAGNOSIS — N6311 Unspecified lump in the right breast, upper outer quadrant: Secondary | ICD-10-CM | POA: Diagnosis not present

## 2016-11-12 ENCOUNTER — Encounter: Payer: Self-pay | Admitting: Internal Medicine

## 2016-11-12 ENCOUNTER — Ambulatory Visit (INDEPENDENT_AMBULATORY_CARE_PROVIDER_SITE_OTHER): Payer: Commercial Managed Care - PPO | Admitting: Internal Medicine

## 2016-11-12 VITALS — BP 140/80 | HR 86 | Resp 16 | Wt 273.0 lb

## 2016-11-12 DIAGNOSIS — J9611 Chronic respiratory failure with hypoxia: Secondary | ICD-10-CM | POA: Diagnosis not present

## 2016-11-12 DIAGNOSIS — F419 Anxiety disorder, unspecified: Secondary | ICD-10-CM

## 2016-11-12 DIAGNOSIS — E785 Hyperlipidemia, unspecified: Secondary | ICD-10-CM

## 2016-11-12 DIAGNOSIS — M1990 Unspecified osteoarthritis, unspecified site: Secondary | ICD-10-CM

## 2016-11-12 DIAGNOSIS — IMO0002 Reserved for concepts with insufficient information to code with codable children: Secondary | ICD-10-CM

## 2016-11-12 DIAGNOSIS — M25561 Pain in right knee: Secondary | ICD-10-CM | POA: Diagnosis not present

## 2016-11-12 DIAGNOSIS — E118 Type 2 diabetes mellitus with unspecified complications: Secondary | ICD-10-CM | POA: Diagnosis not present

## 2016-11-12 DIAGNOSIS — E1165 Type 2 diabetes mellitus with hyperglycemia: Secondary | ICD-10-CM | POA: Diagnosis not present

## 2016-11-12 DIAGNOSIS — Z794 Long term (current) use of insulin: Secondary | ICD-10-CM

## 2016-11-12 DIAGNOSIS — K219 Gastro-esophageal reflux disease without esophagitis: Secondary | ICD-10-CM

## 2016-11-12 LAB — POCT GLYCOSYLATED HEMOGLOBIN (HGB A1C): HEMOGLOBIN A1C: 8

## 2016-11-12 MED ORDER — ALBUTEROL SULFATE HFA 108 (90 BASE) MCG/ACT IN AERS: INHALATION_SPRAY | RESPIRATORY_TRACT | 1 refills | Status: DC

## 2016-11-12 MED ORDER — INSULIN PEN NEEDLE 29G X 5MM MISC: 1.0000 | Freq: Three times a day (TID) | 1 refills | Status: DC

## 2016-11-12 MED ORDER — PANTOPRAZOLE SODIUM 40 MG PO TBEC: 40.0000 mg | DELAYED_RELEASE_TABLET | Freq: Every day | ORAL | 3 refills | Status: DC

## 2016-11-12 MED ORDER — INSULIN ASPART 100 UNIT/ML FLEXPEN: PEN_INJECTOR | SUBCUTANEOUS | 2 refills | Status: DC

## 2016-11-12 NOTE — Progress Notes (Signed)
Subjective:  Patient ID: Catherine [name], female    DOB: 11-21-1955  Age: 61 y.o. MRN: [id number]  CC: The primary encounter diagnosis was Medial knee pain, right. Diagnoses of Uncontrolled type 2 diabetes mellitus with complication, with long-term current use of insulin (HCC), Chronic hypoxemic respiratory failure (HCC), Gastroesophageal reflux disease without esophagitis, Inflammatory arthritis, Anxiety, and Hyperlipidemia, unspecified hyperlipidemia type were also pertinent to this visit.  HPI Catherine [name] presents for 3 month follow up on diabetes.  Patient has bilateral knee pain that has been present since  mid December   .  Patient is  NOT  following a low glycemic index diet and taking all prescribed medications regularly without side effects.  Fasting sugars have been over 140 most of the time and post prandials have been over 160 except on rare occasions. Patient is not exercising or intentionally trying to lose weight .  Patient has uses sliding scale of Novalog insulin 3 times daily  ,  Using 12 to 14 units  Dinner,  Less if less starches  levemir  Using 40 uints of Levemir   Has been skipping meals due to persistent nausea, for the last 2 weeks.  Feeling that she needs to empty her bowels  He had an eye exam in the last 12 months and checks feet regularly for signs of infection.  Patient does not walk barefoot outside,  And denies an numbness tingling or burning in feet. Patient is up to date on all recommended vaccinations   Lab Results  Component Value Date   HGBA1C 8.0 11/12/2016   Today's a1c is 8.0     Outpatient Medications Prior to Visit  Medication Sig Dispense Refill  . ACCU-CHEK SMARTVIEW test strip TEST BLOOD SUGAR FOUR TIMES DAILY 100 each 12  . ALPRAZolam (XANAX) 0.5 MG tablet Take 1 tablet (0.5 mg total) by mouth at bedtime as needed. 30 tablet 5  . aspirin 81 MG EC tablet Take 81 mg by mouth daily.      Marland Kitchen azithromycin (ZITHROMAX) 250 MG tablet Take  1 tablet every Monday, Wednesday & Friday 13 tablet 11  . butalbital-acetaminophen-caffeine (FIORICET, ESGIC) 50-325-40 MG tablet TAKE 1 TABLET BY MOUTH EVERY 4 HOURS AS NEEDED FOR HEADACHE 30 tablet 3  . chlorpheniramine-HYDROcodone (TUSSIONEX PENNKINETIC ER) 10-8 MG/5ML SUER 2.5 ml po qhs prn cough 100 mL 0  . docusate sodium (COLACE) 100 MG capsule Take 200 mg by mouth daily. Reported on 09/20/2015    . ergocalciferol (DRISDOL) 50000 units capsule Take 1 capsule (50,000 Units total) by mouth once a week. 12 capsule 3  . glipiZIDE (GLUCOTROL) 5 MG tablet TAKE 1 TABLET BY MOUTH BEFORE BREAKFAST AND 2 TABLETS BEFORE DINNER  2  . Insulin Detemir (LEVEMIR) 100 UNIT/ML Pen Inject 50 Units into the skin daily at 10 pm. 45 mL 3  . ipratropium-albuterol (DUONEB) 0.5-2.5 (3) MG/3ML SOLN USE 1 VIAL VIA NEBULIZER EVERY 6 HOURS AS NEEDED 1080 mL 3  . Magnesium 250 MG TABS Take 250 mg by mouth daily.    Marland Kitchen PARoxetine (PAXIL) 20 MG tablet Take 1 tablet (20 mg total) by mouth daily. TAKE 1 TABLET(10 MG) BY MOUTH EVERY MORNING 90 tablet 1  . Potassium 99 MG TABS Take 99 mg by mouth once. Reported on 09/20/2015    . rosuvastatin (CRESTOR) 40 MG tablet Take 1 tablet (40 mg total) by mouth daily. 90 tablet 1  . umeclidinium-vilanterol (ANORO ELLIPTA) 62.5-25 MCG/INH AEPB Inhale 1 puff into the lungs daily. 90  each 3  . atorvastatin (LIPITOR) 80 MG tablet TAKE 1 TABLET (80 MG TOTAL) BY MOUTH DAILY. 90 tablet 1  . famotidine (PEPCID) 20 MG tablet TAKE 1 TABLET (20 MG TOTAL) BY MOUTH 2 (TWO) TIMES DAILY. 180 tablet 1  . Insulin Pen Needle 29G X 12.7MM MISC Use as directed 200 each 1  . Insulin Syringe-Needle U-100 (INSULIN SYRINGE .5CC/31GX5/16") 31G X 5/16" 0.5 ML MISC Test twice a day 100 each 11  . NOVOLOG FLEXPEN 100 UNIT/ML FlexPen INJECT 10 UNITS            SUBCUTANEOUSLY 3 TIMES A   DAY BEFORE MEALS. OFFICE   APPOINTMENT NEEDED ! 90 mL 2  . PROAIR HFA 108 (90 Base) MCG/ACT inhaler USE 2 INHALATIONS ORALLY   EVERY  6 HOURS AS NEEDED FORWHEEZING 17 g 1   No facility-administered medications prior to visit.     Review of Systems;  Patient denies headache, fevers, malaise, unintentional weight loss, skin rash, eye pain, sinus congestion and sinus pain, sore throat, dysphagia,  hemoptysis , cough, dyspnea, wheezing, chest pain, palpitations, orthopnea, edema, abdominal pain, nausea, melena, diarrhea, constipation, flank pain, dysuria, hematuria, urinary  Frequency, nocturia, numbness, tingling, seizures,  Focal weakness, Loss of consciousness,  Tremor, insomnia, depression, anxiety, and suicidal ideation.      Objective:  BP 140/80   Pulse 86   Resp 16   Wt 273 lb (123.8 kg)   SpO2 97% Comment: on 4L o2  BMI 48.36 kg/m   BP Readings from Last 3 Encounters:  11/12/16 140/80  10/06/16 138/78  08/25/16 136/78    Wt Readings from Last 3 Encounters:  11/12/16 273 lb (123.8 kg)  10/06/16 274 lb 12.8 oz (124.6 kg)  08/25/16 275 lb 8 oz (125 kg)    General appearance: alert, cooperative and appears stated age Ears: normal TM's and external ear canals both ears Throat: lips, mucosa, and tongue normal; teeth and gums normal Neck: no adenopathy, no carotid bruit, supple, symmetrical, trachea midline and thyroid not enlarged, symmetric, no tenderness/mass/nodules Back: symmetric, no curvature. ROM normal. No CVA tenderness. Lungs: clear to auscultation bilaterally Heart: regular rate and rhythm, S1, S2 normal, no murmur, click, rub or gallop Abdomen: soft, non-tender; bowel sounds normal; no masses,  no organomegaly Pulses: 2+ and symmetric Skin: Skin color, texture, turgor normal. No rashes or lesions Lymph nodes: Cervical, supraclavicular, and axillary nodes normal.  Lab Results  Component Value Date   HGBA1C 8.0 11/12/2016   HGBA1C 8.5 (H) 08/08/2016   HGBA1C 7.6 (H) 04/21/2016    Lab Results  Component Value Date   CREATININE 0.79 08/08/2016   CREATININE 0.76 04/21/2016   CREATININE  0.64 11/19/2015    Lab Results  Component Value Date   WBC 14.5 (H) 08/08/2016   HGB 13.6 08/08/2016   HCT 41.0 08/08/2016   PLT 330 08/08/2016   GLUCOSE 164 (H) 08/08/2016   CHOL 164 08/08/2016   TRIG 125 08/08/2016   HDL 64 08/08/2016   LDLDIRECT 155.0 04/21/2016   LDLCALC 75 08/08/2016   ALT 27 08/08/2016   AST 26 08/08/2016   NA 141 08/08/2016   K 4.3 08/08/2016   CL 100 08/08/2016   CREATININE 0.79 08/08/2016   BUN 16 08/08/2016   CO2 30 08/08/2016   TSH 0.628 01/04/2012   INR 0.9 07/22/2012   HGBA1C 8.0 11/12/2016   MICROALBUR <0.2 08/08/2016    US Breast Ltd Uni Right Inc Axilla  Result Date: 10/31/2016 CLINICAL DATA:  61 year old patient recalled from screening mammogram for evaluation of a mass in the right breast. EXAM: DIGITAL DIAGNOSTIC RIGHT MAMMOGRAM WITH CAD ULTRASOUND RIGHT BREAST COMPARISON:  08/19/2016, 11/14/2014, 11/25/2012, 07/28/2011 ACR Breast Density Category b: There are scattered areas of fibroglandular density. FINDINGS: Whole breast CC and MLO views including tomography are performed. There is a circumscribed approximately 6 mm oval nodule in the upper outer right breast. No definite mammographic change is appreciated in this small nodule, dating back to the mammogram of March 2014. Mammographic images were processed with CAD. Targeted ultrasound is performed, showing an oval nearly anechoic circumscribed nodule with some internal echoes at 10 o'clock position 12 cm from the nipple measuring 7 x 6 x 4 mm. There is a well-defined posterior wall and posterior acoustic enhancement. There is no vascular flow within the nodule. IMPRESSION: Benign-appearing nodule in the 10 o'clock position of the right breast with imaging features compatible with a minimally complicated cyst. RECOMMENDATION: Screening mammogram in one year.(Code:SM-B-01Y) I have discussed the findings and recommendations with the patient. Results were also provided in writing at the conclusion of  the visit. If applicable, a reminder letter will be sent to the patient regarding the next appointment. BI-RADS CATEGORY  2: Benign. Electronically Signed   By: Britta Mccreedy M.D.   On: 10/31/2016 16:34   Mm Diag Breast Tomo Uni Right  Result Date: 10/31/2016 CLINICAL DATA:  61 year old patient recalled from screening mammogram for evaluation of a mass in the right breast. EXAM: DIGITAL DIAGNOSTIC RIGHT MAMMOGRAM WITH CAD ULTRASOUND RIGHT BREAST COMPARISON:  08/19/2016, 11/14/2014, 11/25/2012, 07/28/2011 ACR Breast Density Category b: There are scattered areas of fibroglandular density. FINDINGS: Whole breast CC and MLO views including tomography are performed. There is a circumscribed approximately 6 mm oval nodule in the upper outer right breast. No definite mammographic change is appreciated in this small nodule, dating back to the mammogram of March 2014. Mammographic images were processed with CAD. Targeted ultrasound is performed, showing an oval nearly anechoic circumscribed nodule with some internal echoes at 10 o'clock position 12 cm from the nipple measuring 7 x 6 x 4 mm. There is a well-defined posterior wall and posterior acoustic enhancement. There is no vascular flow within the nodule. IMPRESSION: Benign-appearing nodule in the 10 o'clock position of the right breast with imaging features compatible with a minimally complicated cyst. RECOMMENDATION: Screening mammogram in one year.(Code:SM-B-01Y) I have discussed the findings and recommendations with the patient. Results were also provided in writing at the conclusion of the visit. If applicable, a reminder letter will be sent to the patient regarding the next appointment. BI-RADS CATEGORY  2: Benign. Electronically Signed   By: Britta Mccreedy M.D.   On: 10/31/2016 16:34    Assessment & Plan:   Problem List Items Addressed This Visit    Anxiety    Managed with paxil  And prn alprazolam for prn use . The risks and benefits of benzodiazepine  use were reviewed with patient today including excessive sedation leading to respiratory depression,  impaired thinking/driving, and addiction.  Patient was advised to avoid concurrent use with alcohol, to use medication only as needed and not to share with others  .       Chronic hypoxemic respiratory failure (HCC)    She remains dyspneic with minimal exertion and 02 dependent . She is is not able to return to any form of work due to severe lung disease.       Diabetes mellitus type 2, uncontrolled, with  complications (HCC)    Improved but still not at goal.  Advised to increase Levemir to 50 units and increase sliding scale mealtime insulin to baseline 12 units  based on post prandials and carb counting . Current elevations due to dietary noncompliance  .  Diet reviewed, she has reduced her indulgence in oatmeal cookies in favor of apples/cheese or other high protein /low GI alternatives.  Lab Results  Component Value Date   HGBA1C 8.0 11/12/2016         Relevant Medications   insulin aspart (NOVOLOG FLEXPEN) 100 UNIT/ML FlexPen   Other Relevant Orders   POCT glycosylated hemoglobin (Hb A1C) (Completed)   GERD (gastroesophageal reflux disease)    Now with symptoms of gastritis. changing famotidine to protonix       Relevant Medications   pantoprazole (PROTONIX) 40 MG tablet   Hyperlipidemia    LDL and triglycerides are at goal on current medications. He has no side effects and liver enzymes are normal. No changes today  Lab Results  Component Value Date   CHOL 164 08/08/2016   HDL 64 08/08/2016   LDLCALC 75 08/08/2016   LDLDIRECT 155.0 04/21/2016   TRIG 125 08/08/2016   CHOLHDL 2.6 08/08/2016         Inflammatory arthritis    Vs RA per rheumatology eval in 2016.  Did not tolerate placquenil or leflunamide.         Other Visit Diagnoses    Medial knee pain, right    -  Primary   Relevant Orders   DG Knee Complete 4 Views Right      I have discontinued Ms.  Martelle's INSULIN SYRINGE .5CC/31GX5/16", Insulin Pen Needle, famotidine, and atorvastatin. I have also changed her PROAIR HFA to albuterol and NOVOLOG FLEXPEN to insulin aspart. Additionally, I am having her start on Insulin Pen Needle and pantoprazole. Lastly, I am having her maintain her aspirin, docusate sodium, ACCU-CHEK SMARTVIEW, Potassium, Magnesium, Insulin Detemir, ipratropium-albuterol, azithromycin, butalbital-acetaminophen-caffeine, umeclidinium-vilanterol, ergocalciferol, rosuvastatin, PARoxetine, ALPRAZolam, chlorpheniramine-HYDROcodone, and glipiZIDE.  Meds ordered this encounter  Medications  . albuterol (PROAIR HFA) 108 (90 Base) MCG/ACT inhaler    Sig: USE 2 INHALATIONS ORALLY   EVERY 6 HOURS AS NEEDED FORWHEEZING    Dispense:  17 g    Refill:  1  . insulin aspart (NOVOLOG FLEXPEN) 100 UNIT/ML FlexPen    Sig: INJECT 12 UNITS            SUBCUTANEOUSLY 3 TIMES A   DAY BEFORE MEALS. OFFICE    Dispense:  90 mL    Refill:  2  . Insulin Pen Needle 29G X 5MM MISC    Sig: 1 each by Does not apply route 3 (three) times daily. Dx:    Dispense:  200 each    Refill:  1  . pantoprazole (PROTONIX) 40 MG tablet    Sig: Take 1 tablet (40 mg total) by mouth daily.    Dispense:  30 tablet    Refill:  3    Medications Discontinued During This Encounter  Medication Reason  . atorvastatin (LIPITOR) 80 MG tablet Patient has not taken in last 30 days  . Insulin Pen Needle 29G X 12.7MM MISC   . Insulin Syringe-Needle U-100 (INSULIN SYRINGE .5CC/31GX5/16") 31G X 5/16" 0.5 ML MISC Patient has not taken in last 30 days  . famotidine (PEPCID) 20 MG tablet   . PROAIR HFA 108 (90 Base) MCG/ACT inhaler Reorder  . NOVOLOG FLEXPEN 100 UNIT/ML FlexPen Reorder  Follow-up: Return in about 3 months (around 02/12/2017) for follow up diabetes.   Sherlene Shams, MD

## 2016-11-12 NOTE — Patient Instructions (Addendum)
Increase your Levemir to 50 units daily  Increase the sliding scale by 2 units at the base,  SO YOU ARE starting at 12 units .    For the stomachL  Change famotdine to protonix  Once daily in the morning   The KETO diet is an excellent diet for people with diabetes

## 2016-11-12 NOTE — Progress Notes (Signed)
Pre visit review using our clinic review tool, if applicable. No additional management support is needed unless otherwise documented below in the visit note. 

## 2016-11-13 NOTE — Assessment & Plan Note (Signed)
Improved but still not at goal.  Advised to increase Levemir to 50 units and increase sliding scale mealtime insulin to baseline 12 units  based on post prandials and carb counting . Current elevations due to dietary noncompliance  .  Diet reviewed, she has reduced her indulgence in oatmeal cookies in favor of apples/cheese or other high protein /low GI alternatives.  Lab Results  Component Value Date   HGBA1C 8.0 11/12/2016

## 2016-11-13 NOTE — Assessment & Plan Note (Signed)
Now with symptoms of gastritis. changing famotidine to protonix

## 2016-11-13 NOTE — Assessment & Plan Note (Signed)
Vs RA per rheumatology eval in 2016.  Did not tolerate placquenil or leflunamide.

## 2016-11-13 NOTE — Assessment & Plan Note (Signed)
She remains dyspneic with minimal exertion and 02 dependent . She is is not able to return to any form of work due to severe lung disease.

## 2016-11-13 NOTE — Assessment & Plan Note (Signed)
LDL and triglycerides are at goal on current medications. He has no side effects and liver enzymes are normal. No changes today  Lab Results  Component Value Date   CHOL 164 08/08/2016   HDL 64 08/08/2016   LDLCALC 75 08/08/2016   LDLDIRECT 155.0 04/21/2016   TRIG 125 08/08/2016   CHOLHDL 2.6 08/08/2016

## 2016-11-13 NOTE — Assessment & Plan Note (Signed)
Managed with paxil  And prn alprazolam for prn use . The risks and benefits of benzodiazepine use were reviewed with patient today including excessive sedation leading to respiratory depression,  impaired thinking/driving, and addiction.  Patient was advised to avoid concurrent use with alcohol, to use medication only as needed and not to share with others  .  

## 2016-11-15 ENCOUNTER — Other Ambulatory Visit: Payer: Self-pay | Admitting: Internal Medicine

## 2016-12-07 ENCOUNTER — Other Ambulatory Visit: Payer: Self-pay | Admitting: Internal Medicine

## 2016-12-17 ENCOUNTER — Ambulatory Visit (INDEPENDENT_AMBULATORY_CARE_PROVIDER_SITE_OTHER): Payer: Commercial Managed Care - PPO | Admitting: Internal Medicine

## 2016-12-17 ENCOUNTER — Ambulatory Visit (INDEPENDENT_AMBULATORY_CARE_PROVIDER_SITE_OTHER): Payer: Commercial Managed Care - PPO

## 2016-12-17 ENCOUNTER — Encounter: Payer: Self-pay | Admitting: Internal Medicine

## 2016-12-17 VITALS — BP 130/74 | HR 105 | Temp 98.1°F | Resp 18 | Ht 63.0 in | Wt 275.0 lb

## 2016-12-17 DIAGNOSIS — E1165 Type 2 diabetes mellitus with hyperglycemia: Secondary | ICD-10-CM | POA: Diagnosis not present

## 2016-12-17 DIAGNOSIS — L658 Other specified nonscarring hair loss: Secondary | ICD-10-CM | POA: Diagnosis not present

## 2016-12-17 DIAGNOSIS — M25561 Pain in right knee: Secondary | ICD-10-CM

## 2016-12-17 DIAGNOSIS — E118 Type 2 diabetes mellitus with unspecified complications: Secondary | ICD-10-CM

## 2016-12-17 DIAGNOSIS — G8929 Other chronic pain: Secondary | ICD-10-CM

## 2016-12-17 DIAGNOSIS — M25562 Pain in left knee: Secondary | ICD-10-CM

## 2016-12-17 DIAGNOSIS — R110 Nausea: Secondary | ICD-10-CM

## 2016-12-17 DIAGNOSIS — Z794 Long term (current) use of insulin: Secondary | ICD-10-CM

## 2016-12-17 DIAGNOSIS — R1904 Left lower quadrant abdominal swelling, mass and lump: Secondary | ICD-10-CM

## 2016-12-17 DIAGNOSIS — IMO0002 Reserved for concepts with insufficient information to code with codable children: Secondary | ICD-10-CM

## 2016-12-17 LAB — POCT URINALYSIS DIPSTICK
Bilirubin, UA: NEGATIVE
Glucose, UA: 250
KETONES UA: NEGATIVE
LEUKOCYTES UA: NEGATIVE
NITRITE UA: NEGATIVE
PH UA: 7.5 (ref 5.0–8.0)
Protein, UA: NEGATIVE
RBC UA: NEGATIVE
Spec Grav, UA: 1.015 (ref 1.010–1.025)
Urobilinogen, UA: 0.2 E.U./dL

## 2016-12-17 MED ORDER — PROMETHAZINE HCL 12.5 MG PO TABS: 12.5000 mg | ORAL_TABLET | Freq: Every evening | ORAL | 1 refills | Status: DC | PRN

## 2016-12-17 NOTE — Progress Notes (Signed)
Pre visit review using our clinic review tool, if applicable. No additional management support is needed unless otherwise documented below in the visit note. 

## 2016-12-17 NOTE — Progress Notes (Signed)
Subjective:  Patient ID: Catherine [name], female    DOB: September 05, 1956  Age: 61 y.o. MRN: [id number]  CC: The primary encounter diagnosis was Chronic pain of left knee. Diagnoses of Nausea, Medial knee pain, right, Abdominal mass, LLQ (left lower quadrant), Uncontrolled type 2 diabetes mellitus with complication, with long-term current use of insulin (HCC), Bilateral chronic knee pain, Nausea in adult, and Other specified nonscarring hair loss were also pertinent to this visit.  HPI Catherine [name] presents for  Follow up on multiple issues  EVALUATION OF ABDOMINAL MASS on left side of abdomen,  Started as a small lump, about the size of pulse ox meter, has been present and painless for years.  Noticed  today while showering that her abdomen on the left was much larger than on the right  . Surgical history includes TAH/BSO non cancer reasons.  Last colonscopy 2011 . 3 yr follow up on polyps not done due to CAD and severe COPD   Joint pain : Right knee still hurting a lot,  Never got the x ray   swells medially Now left knee hurts to move  Or extend As well.     Type 2 DM:  She has brought her blood sugars in for review.  She has seen no improvement in BS despite increase in levemir to 50 units  Daily   New onset nausea: symptoms recur in the evening with supine position ,  no vomiting.  Occurs again in the  MORNING     TAKES PAXIL IN THE MORNING .  And magnesium at night,  No unintentional weight loss.  Appetite good .   Losing hair in a diffuse pattern, noticing more in the brush on a daily basis.   Lab Results  Component Value Date   HGBA1C 8.0 11/12/2016     Outpatient Medications Prior to Visit  Medication Sig Dispense Refill  . ACCU-CHEK SMARTVIEW test strip TEST BLOOD SUGAR FOUR TIMES DAILY 100 each 12  . albuterol (PROAIR HFA) 108 (90 Base) MCG/ACT inhaler USE 2 INHALATIONS ORALLY   EVERY 6 HOURS AS NEEDED FORWHEEZING 17 g 1  . ALPRAZolam (XANAX) 0.5 MG tablet Take 1 tablet  (0.5 mg total) by mouth at bedtime as needed. 30 tablet 5  . aspirin 81 MG EC tablet Take 81 mg by mouth daily.      Marland Kitchen azithromycin (ZITHROMAX) 250 MG tablet Take 1 tablet every Monday, Wednesday & Friday 13 tablet 11  . butalbital-acetaminophen-caffeine (FIORICET, ESGIC) 50-325-40 MG tablet TAKE 1 TABLET BY MOUTH EVERY 4 HOURS AS NEEDED FOR HEADACHE 30 tablet 3  . chlorpheniramine-HYDROcodone (TUSSIONEX PENNKINETIC ER) 10-8 MG/5ML SUER 2.5 ml po qhs prn cough 100 mL 0  . docusate sodium (COLACE) 100 MG capsule Take 200 mg by mouth daily. Reported on 09/20/2015    . ergocalciferol (DRISDOL) 50000 units capsule Take 1 capsule (50,000 Units total) by mouth once a week. 12 capsule 3  . glipiZIDE (GLUCOTROL) 5 MG tablet TAKE 1 TABLET BY MOUTH BEFORE BREAKFAST AND 2 TABLETS BEFORE DINNER  2  . insulin aspart (NOVOLOG FLEXPEN) 100 UNIT/ML FlexPen INJECT 12 UNITS            SUBCUTANEOUSLY 3 TIMES A   DAY BEFORE MEALS. OFFICE 90 mL 2  . Insulin Pen Needle 29G X MISC 1 each by Does not apply route 3 (three) times daily. Dx: 200 each 1  . ipratropium-albuterol (DUONEB) 0.5-2.5 (3) MG/3ML SOLN USE 1 VIAL VIA NEBULIZER EVERY 6 HOURS  AS NEEDED 1080 mL 3  . LEVEMIR FLEXTOUCH 100 UNIT/ML Pen INJECT 50 UNITS INTO THE SKIN DAILY AT 10PM 45 pen 3  . Magnesium 250 MG TABS Take 250 mg by mouth daily.    . pantoprazole (PROTONIX) 40 MG tablet Take 1 tablet (40 mg total) by mouth daily. 30 tablet 3  . PARoxetine (PAXIL) 10 MG tablet TAKE 1 TABLET(10 MG) BY MOUTH EVERY MORNING 90 tablet 1  . PARoxetine (PAXIL) 20 MG tablet Take 1 tablet (20 mg total) by mouth daily. TAKE 1 TABLET(10 MG) BY MOUTH EVERY MORNING 90 tablet 1  . Potassium 99 MG TABS Take 99 mg by mouth once. Reported on 09/20/2015    . rosuvastatin (CRESTOR) 40 MG tablet Take 1 tablet (40 mg total) by mouth daily. 90 tablet 1  . umeclidinium-vilanterol (ANORO ELLIPTA) 62.5-25 MCG/INH AEPB Inhale 1 puff into the lungs daily. 90 each 3   No  facility-administered medications prior to visit.     Review of Systems;  Patient denies headache, fevers, malaise, unintentional weight loss, skin rash, eye pain, sinus congestion and sinus pain, sore throat, dysphagia,  hemoptysis , cough, dyspnea, wheezing, chest pain, palpitations, orthopnea, edema, abdominal pain, , melena, diarrhea, constipation, flank pain, dysuria, hematuria, urinary  Frequency, nocturia, numbness, tingling, seizures,  Focal weakness, Loss of consciousness,  Tremor, insomnia, depression, anxiety, and suicidal ideation.      Objective:  BP 130/74   Pulse (!) 105   Temp 98.1 F (36.7 C) (Oral)   Resp 18   Ht  (1.6 m)   Wt 275 lb (124.7 kg)   SpO2 97%   BMI 48.71 kg/m   BP Readings from Last 3 Encounters:  12/17/16 130/74  11/12/16 140/80  10/06/16 138/78    Wt Readings from Last 3 Encounters:  12/17/16 275 lb (124.7 kg)  11/12/16 273 lb (123.8 kg)  10/06/16 274 lb 12.8 oz (124.6 kg)    General appearance: alert, cooperative and appears stated age Ears: normal TM's and external ear canals both ears Throat: lips, mucosa, and tongue normal; teeth and gums normal Neck: no adenopathy, no carotid bruit, supple, symmetrical, trachea midline and thyroid not enlarged, symmetric, no tenderness/mass/nodules Back: symmetric, no curvature. ROM normal. No CVA tenderness. Lungs: clear to auscultation bilaterally Heart: regular rate and rhythm, S1, S2 normal, no murmur, click, rub or gallop Abdomen: very obese,, non-tender; bowel sounds normal; has an asymmetric protuberant abdomen,  Left greater than right,  No obvious hernia Pulses: 2+ and symmetric Skin: Skin color, texture, turgor normal. No rashes or lesions Lymph nodes: Cervical, supraclavicular, and axillary nodes normal.  Lab Results  Component Value Date   HGBA1C 8.0 11/12/2016   HGBA1C 8.5 (H) 08/08/2016   HGBA1C 7.6 (H) 04/21/2016    Lab Results  Component Value Date   CREATININE 0.86  12/17/2016   CREATININE 0.79 08/08/2016   CREATININE 0.76 04/21/2016    Lab Results  Component Value Date   WBC 14.5 (H) 08/08/2016   HGB 13.6 08/08/2016   HCT 41.0 08/08/2016   PLT 330 08/08/2016   GLUCOSE 180 (H) 12/17/2016   CHOL 164 08/08/2016   TRIG 125 08/08/2016   HDL 64 08/08/2016   LDLDIRECT 155.0 04/21/2016   LDLCALC 75 08/08/2016   ALT 25 12/17/2016   AST 39 (H) 12/17/2016   NA 141 12/17/2016   K 3.7 12/17/2016   CL 104 12/17/2016   CREATININE 0.86 12/17/2016   BUN 10 12/17/2016   CO2 32 12/17/2016  TSH 0.628 01/04/2012   INR 0.9 07/22/2012   HGBA1C 8.0 11/12/2016   MICROALBUR <0.2 08/08/2016    US Breast Ltd Uni Right Inc Axilla  Result Date: 10/31/2016 CLINICAL DATA:  61 year old patient recalled from screening mammogram for evaluation of a mass in the right breast. EXAM: DIGITAL DIAGNOSTIC RIGHT MAMMOGRAM WITH CAD ULTRASOUND RIGHT BREAST COMPARISON:  08/19/2016, 11/14/2014, 11/25/2012, 07/28/2011 ACR Breast Density Category b: There are scattered areas of fibroglandular density. FINDINGS: Whole breast CC and MLO views including tomography are performed. There is a circumscribed approximately 6 mm oval nodule in the upper outer right breast. No definite mammographic change is appreciated in this small nodule, dating back to the mammogram of March 2014. Mammographic images were processed with CAD. Targeted ultrasound is performed, showing an oval nearly anechoic circumscribed nodule with some internal echoes at 10 o'clock position 12 cm from the nipple measuring 7 x 6 x 4 mm. There is a well-defined posterior wall and posterior acoustic enhancement. There is no vascular flow within the nodule. IMPRESSION: Benign-appearing nodule in the 10 o'clock position of the right breast with imaging features compatible with a minimally complicated cyst. RECOMMENDATION: Screening mammogram in one year.(Code:SM-B-01Y) I have discussed the findings and recommendations with the patient.  Results were also provided in writing at the conclusion of the visit. If applicable, a reminder letter will be sent to the patient regarding the next appointment. BI-RADS CATEGORY  2: Benign. Electronically Signed   By: Britta Mccreedy M.D.   On: 10/31/2016 16:34   Mm Diag Breast Tomo Uni Right  Result Date: 10/31/2016 CLINICAL DATA:  61 year old patient recalled from screening mammogram for evaluation of a mass in the right breast. EXAM: DIGITAL DIAGNOSTIC RIGHT MAMMOGRAM WITH CAD ULTRASOUND RIGHT BREAST COMPARISON:  08/19/2016, 11/14/2014, 11/25/2012, 07/28/2011 ACR Breast Density Category b: There are scattered areas of fibroglandular density. FINDINGS: Whole breast CC and MLO views including tomography are performed. There is a circumscribed approximately 6 mm oval nodule in the upper outer right breast. No definite mammographic change is appreciated in this small nodule, dating back to the mammogram of March 2014. Mammographic images were processed with CAD. Targeted ultrasound is performed, showing an oval nearly anechoic circumscribed nodule with some internal echoes at 10 o'clock position 12 cm from the nipple measuring 7 x 6 x 4 mm. There is a well-defined posterior wall and posterior acoustic enhancement. There is no vascular flow within the nodule. IMPRESSION: Benign-appearing nodule in the 10 o'clock position of the right breast with imaging features compatible with a minimally complicated cyst. RECOMMENDATION: Screening mammogram in one year.(Code:SM-B-01Y) I have discussed the findings and recommendations with the patient. Results were also provided in writing at the conclusion of the visit. If applicable, a reminder letter will be sent to the patient regarding the next appointment. BI-RADS CATEGORY  2: Benign. Electronically Signed   By: Britta Mccreedy M.D.   On: 10/31/2016 16:34    Assessment & Plan:   Problem List Items Addressed This Visit    Abdominal mass, LLQ (left lower quadrant)     Asymptomatic,  With prior abdominal surgery,  Suspect hernia .  Ct abd and pelvis ordered,       Bilateral chronic knee pain    Degenerative changes are mild and has has no effusions that are drainable.  Advised to lose weight. PT vs Orthopedics referral offered.       Diabetes mellitus type 2, uncontrolled, with complications (HCC)    Advised to increase levemir  to 55 units. Will need to change to NPH at bedtime once she has used up her current lantus supply       Nausea in adult    May be medication induced,  Suspend bedtime magnesium and change  timing of paxil administration to dinnertime,       Other specified nonscarring hair loss    Etiology unclear.  No signs of fungal infection on scalp,  May be nutritional, vvs secondary to chronic illness,  hyperandrogen state.  Recommend  Trial of Vitamin for hair.        Other Visit Diagnoses    Chronic pain of left knee    -  Primary   Relevant Orders   DG Knee Complete 4 Views Left (Completed)   Nausea       Relevant Orders   Comprehensive metabolic panel (Completed)   Lipase (Completed)   POCT urinalysis dipstick (Completed)   Urine Microscopic Only (Completed)   Medial knee pain, right         A total of 40 minutes was spent with patient more than half of which was spent in counseling patient on the above mentioned issues , reviewing and explaining recent labs and imaging studies done, and coordination of care.   I am having Ms. Uhls start on promethazine. I am also having her maintain her aspirin, docusate sodium, ACCU-CHEK SMARTVIEW, Potassium, Magnesium, ipratropium-albuterol, azithromycin, butalbital-acetaminophen-caffeine, umeclidinium-vilanterol, ergocalciferol, rosuvastatin, PARoxetine, ALPRAZolam, chlorpheniramine-HYDROcodone, glipiZIDE, albuterol, insulin aspart, Insulin Pen Needle, pantoprazole, LEVEMIR FLEXTOUCH, and PARoxetine.  Meds ordered this encounter  Medications  . promethazine (PHENERGAN) 12.5 MG  tablet    Sig: Take 1 tablet (12.5 mg total) by mouth at bedtime as needed for nausea or vomiting.    Dispense:  30 tablet    Refill:  1    There are no discontinued medications.  Follow-up: Return in about 2 months (around 02/16/2017).   Sherlene Shams, MD

## 2016-12-17 NOTE — Patient Instructions (Addendum)
Increase your levemir to 55 units daily .  DO NOT REFILL   X RAYS OF BOTH KNEES TODAY  CT  ABDOMEN ASAP  STOP THE MAGNESIUM  CHANGE PAXIL TO EVENING DOSING    YOU CAN TRY HAIR SKIN AND NAILS VITAMIN FOR HAIR

## 2016-12-18 ENCOUNTER — Encounter: Payer: Self-pay | Admitting: Internal Medicine

## 2016-12-18 LAB — COMPREHENSIVE METABOLIC PANEL
ALBUMIN: 4 g/dL (ref 3.5–5.2)
ALT: 25 U/L (ref 0–35)
AST: 39 U/L — ABNORMAL HIGH (ref 0–37)
Alkaline Phosphatase: 105 U/L (ref 39–117)
BUN: 10 mg/dL (ref 6–23)
CHLORIDE: 104 meq/L (ref 96–112)
CO2: 32 meq/L (ref 19–32)
Calcium: 9.2 mg/dL (ref 8.4–10.5)
Creatinine, Ser: 0.86 mg/dL (ref 0.40–1.20)
GFR: 70.97 mL/min (ref >60.00)
GLUCOSE: 180 mg/dL — AB (ref 70–99)
POTASSIUM: 3.7 meq/L (ref 3.5–5.1)
SODIUM: 141 meq/L (ref 135–145)
Total Bilirubin: 0.2 mg/dL (ref 0.2–1.2)
Total Protein: 7.5 g/dL (ref 6.0–8.3)

## 2016-12-18 LAB — URINALYSIS, MICROSCOPIC ONLY
RBC / HPF: NONE SEEN (ref 0–?)
WBC, UA: NONE SEEN (ref 0–?)

## 2016-12-18 LAB — LIPASE: LIPASE: 24 U/L (ref 11.0–59.0)

## 2016-12-20 ENCOUNTER — Encounter: Payer: Self-pay | Admitting: Internal Medicine

## 2016-12-20 DIAGNOSIS — M25561 Pain in right knee: Secondary | ICD-10-CM

## 2016-12-20 DIAGNOSIS — R110 Nausea: Secondary | ICD-10-CM | POA: Insufficient documentation

## 2016-12-20 DIAGNOSIS — R1904 Left lower quadrant abdominal swelling, mass and lump: Secondary | ICD-10-CM | POA: Insufficient documentation

## 2016-12-20 DIAGNOSIS — L658 Other specified nonscarring hair loss: Secondary | ICD-10-CM | POA: Insufficient documentation

## 2016-12-20 DIAGNOSIS — M25562 Pain in left knee: Secondary | ICD-10-CM

## 2016-12-20 DIAGNOSIS — G8929 Other chronic pain: Secondary | ICD-10-CM | POA: Insufficient documentation

## 2016-12-20 NOTE — Assessment & Plan Note (Signed)
Advised to increase levemir to 55 units. Will need to change to NPH at bedtime once she has used up her current lantus supply

## 2016-12-20 NOTE — Assessment & Plan Note (Signed)
Degenerative changes are mild and has has no effusions that are drainable.  Advised to lose weight. PT vs Orthopedics referral offered.

## 2016-12-20 NOTE — Assessment & Plan Note (Signed)
Asymptomatic,  With prior abdominal surgery,  Suspect hernia .  Ct abd and pelvis ordered,

## 2016-12-20 NOTE — Assessment & Plan Note (Addendum)
May be medication induced,  Suspend bedtime magnesium and change  timing of paxil administration to dinnertime,

## 2016-12-20 NOTE — Assessment & Plan Note (Signed)
Etiology unclear.  No signs of fungal infection on scalp,  May be nutritional, vvs secondary to chronic illness,  hyperandrogen state.  Recommend  Trial of Vitamin for hair.

## 2016-12-22 ENCOUNTER — Encounter: Payer: Self-pay | Admitting: Internal Medicine

## 2016-12-22 ENCOUNTER — Other Ambulatory Visit: Payer: Self-pay | Admitting: Internal Medicine

## 2016-12-22 DIAGNOSIS — R748 Abnormal levels of other serum enzymes: Secondary | ICD-10-CM

## 2017-01-04 ENCOUNTER — Other Ambulatory Visit: Payer: Self-pay | Admitting: Internal Medicine

## 2017-01-28 ENCOUNTER — Telehealth: Payer: Self-pay | Admitting: *Deleted

## 2017-01-28 DIAGNOSIS — R1904 Left lower quadrant abdominal swelling, mass and lump: Secondary | ICD-10-CM

## 2017-01-28 NOTE — Telephone Encounter (Signed)
Lookin gin the pt's chart at last OV with you it states that a CT of abdomen needed to be done asap. I don't see that this ordered. Wasn't sure if maybe you decided not to continue with the CT. Please advise.

## 2017-01-28 NOTE — Telephone Encounter (Signed)
Ct ordered

## 2017-01-28 NOTE — Telephone Encounter (Signed)
Patient stated that she was to have a CT scan following her last office visit 12/17/16, however she hasn't received a call . Patient requested a update  Pt contact [phone]

## 2017-01-30 NOTE — Telephone Encounter (Signed)
Spoke with pt and informed her that the ct has been ordered and that Efraim KaufmannMelissa will have to run it through her insurance and then once it is scheduled she will give the pt a call.

## 2017-02-02 ENCOUNTER — Other Ambulatory Visit: Payer: Self-pay | Admitting: Internal Medicine

## 2017-02-05 ENCOUNTER — Other Ambulatory Visit: Payer: Self-pay | Admitting: Internal Medicine

## 2017-02-09 ENCOUNTER — Ambulatory Visit
Admission: RE | Admit: 2017-02-09 | Discharge: 2017-02-09 | Disposition: A | Discharge status: Home / Self Care | Payer: Commercial Managed Care - PPO | Source: Ambulatory Visit | Attending: Internal Medicine | Admitting: Internal Medicine

## 2017-02-09 DIAGNOSIS — K760 Fatty (change of) liver, not elsewhere classified: Secondary | ICD-10-CM | POA: Diagnosis not present

## 2017-02-09 DIAGNOSIS — R1904 Left lower quadrant abdominal swelling, mass and lump: Secondary | ICD-10-CM | POA: Insufficient documentation

## 2017-02-09 DIAGNOSIS — R918 Other nonspecific abnormal finding of lung field: Secondary | ICD-10-CM | POA: Diagnosis not present

## 2017-02-09 DIAGNOSIS — K5730 Diverticulosis of large intestine without perforation or abscess without bleeding: Secondary | ICD-10-CM | POA: Insufficient documentation

## 2017-02-09 DIAGNOSIS — Z90710 Acquired absence of both cervix and uterus: Secondary | ICD-10-CM | POA: Diagnosis not present

## 2017-02-09 DIAGNOSIS — R1012 Left upper quadrant pain: Secondary | ICD-10-CM | POA: Diagnosis not present

## 2017-02-09 LAB — POCT I-STAT CREATININE: CREATININE: 0.9 mg/dL (ref 0.44–1.00)

## 2017-02-09 MED ORDER — IOPAMIDOL (ISOVUE-370) INJECTION 76%
100.0000 mL | Freq: Once | INTRAVENOUS | Status: AC | PRN
  Administered 2017-02-09: 100 mL via INTRAVENOUS

## 2017-02-13 ENCOUNTER — Other Ambulatory Visit: Payer: Self-pay | Admitting: Internal Medicine

## 2017-02-13 DIAGNOSIS — R918 Other nonspecific abnormal finding of lung field: Secondary | ICD-10-CM

## 2017-02-28 ENCOUNTER — Other Ambulatory Visit: Payer: Self-pay | Admitting: Internal Medicine

## 2017-02-28 DIAGNOSIS — Z794 Long term (current) use of insulin: Secondary | ICD-10-CM

## 2017-02-28 DIAGNOSIS — IMO0002 Reserved for concepts with insufficient information to code with codable children: Secondary | ICD-10-CM

## 2017-02-28 DIAGNOSIS — E785 Hyperlipidemia, unspecified: Secondary | ICD-10-CM

## 2017-02-28 DIAGNOSIS — E559 Vitamin D deficiency, unspecified: Secondary | ICD-10-CM

## 2017-02-28 DIAGNOSIS — E1165 Type 2 diabetes mellitus with hyperglycemia: Secondary | ICD-10-CM

## 2017-02-28 DIAGNOSIS — E118 Type 2 diabetes mellitus with unspecified complications: Secondary | ICD-10-CM

## 2017-03-02 NOTE — Telephone Encounter (Signed)
Last vitamin D level was in 2017, please advise, it was 31. Last OV was 12/17/2016. Thanks

## 2017-03-04 NOTE — Telephone Encounter (Signed)
Please advise 

## 2017-03-04 NOTE — Telephone Encounter (Signed)
[facility] has requested a update on this medication, in reference to having this refilled for Pt.

## 2017-03-04 NOTE — Telephone Encounter (Signed)
Refill for 30 days only. LABS INCLUDING A1C AND FASTING LIPIDS  NEEDED prior to any more refills.

## 2017-03-06 ENCOUNTER — Other Ambulatory Visit: Payer: Self-pay | Admitting: Internal Medicine

## 2017-03-26 ENCOUNTER — Other Ambulatory Visit: Payer: Self-pay | Admitting: Internal Medicine

## 2017-03-30 ENCOUNTER — Other Ambulatory Visit: Payer: Self-pay | Admitting: Internal Medicine

## 2017-04-16 ENCOUNTER — Other Ambulatory Visit: Payer: Self-pay | Admitting: Internal Medicine

## 2017-04-17 ENCOUNTER — Telehealth: Payer: Self-pay | Admitting: Pulmonary Disease

## 2017-04-17 MED ORDER — UMECLIDINIUM-VILANTEROL 62.5-25 MCG/INH IN AEPB: 1.0000 | INHALATION_SPRAY | Freq: Every day | RESPIRATORY_TRACT | 1 refills | Status: DC

## 2017-04-17 NOTE — Telephone Encounter (Signed)
Called and spoke with pt. Pt reports of increased sob, prod non cough, chest tightness & nasal congestion mainly in the morning x1w. Denies fever, chills or sweats. Pt doing neb treats 5-6 times daily. Pt on 4L O2 cont. Pt states on Wednesday her O2 level dropped in the low 70's with exertion on 4L. O2 increased to 94% after neb treatment.  Pt states she has been without  Anoro for two weeks. Rx has been sent to preferred pharmacy.    DR please advise. Thanks.

## 2017-04-19 NOTE — Telephone Encounter (Signed)
Send in script for prednisone.  Prednisone 10 mg tabs x 21.  Take 6 tablets on day 1 Take 5 tablets on day 2 Take 4 tablets on day 3 Take 3 tablets on day 4 Take 2 tablets on day 5 Take 1 tablet on day 6 then stop.

## 2017-04-20 MED ORDER — PREDNISONE 10 MG PO TABS: ORAL_TABLET | ORAL | 0 refills | Status: DC

## 2017-04-20 NOTE — Telephone Encounter (Signed)
Please advise on Anoro not being covered.

## 2017-04-20 NOTE — Telephone Encounter (Signed)
Called and spoke to pt. Informed her of the recs per PR. Rx sent to preferred pharmacy. Pt verbalized understanding and states her Anoro is not covered by insurance. Pt is requesting to be changed to a covered alternative. Per pt the covered alternatives are: Breo, symbicort, advair.   Dr. Nicholos Johnsamachandran please advise. Thanks.

## 2017-04-21 ENCOUNTER — Telehealth: Payer: Self-pay | Admitting: *Deleted

## 2017-04-21 DIAGNOSIS — Z86010 Personal history of colonic polyps: Principal | ICD-10-CM

## 2017-04-21 DIAGNOSIS — Z1211 Encounter for screening for malignant neoplasm of colon: Secondary | ICD-10-CM

## 2017-04-21 MED ORDER — FLUTICASONE FUROATE-VILANTEROL 100-25 MCG/INH IN AEPB: 1.0000 | INHALATION_SPRAY | Freq: Every day | RESPIRATORY_TRACT | 5 refills | Status: DC

## 2017-04-21 NOTE — Telephone Encounter (Signed)
Pt's last colonoscopy was in 2012, polyps were found so she was supposed to follow up with Dr. Markham JordanElliot in 3 years. The pt is past due and stated that she would like a referral back to [facility] with Dr. Markham JordanElliot if he is still there.

## 2017-04-21 NOTE — Telephone Encounter (Signed)
May change to Breo 100 once daily.

## 2017-04-21 NOTE — Telephone Encounter (Signed)
Pt requested to know if it was time to have her colon rectal screening Pt contact [phone]

## 2017-04-21 NOTE — Telephone Encounter (Signed)
Pt informed Breo 100 has been sent to her pharmacy. Nothing further needed.

## 2017-04-22 NOTE — Telephone Encounter (Signed)
Referral in process

## 2017-05-01 ENCOUNTER — Other Ambulatory Visit: Payer: Self-pay | Admitting: Internal Medicine

## 2017-05-03 DIAGNOSIS — J449 Chronic obstructive pulmonary disease, unspecified: Secondary | ICD-10-CM | POA: Diagnosis not present

## 2017-05-05 DIAGNOSIS — R69 Illness, unspecified: Secondary | ICD-10-CM | POA: Diagnosis not present

## 2017-05-13 ENCOUNTER — Ambulatory Visit
Admission: RE | Admit: 2017-05-13 | Discharge: 2017-05-13 | Disposition: A | Discharge status: Home / Self Care | Payer: Medicare HMO | Source: Ambulatory Visit | Attending: Internal Medicine | Admitting: Internal Medicine

## 2017-05-13 DIAGNOSIS — R918 Other nonspecific abnormal finding of lung field: Secondary | ICD-10-CM | POA: Insufficient documentation

## 2017-05-13 DIAGNOSIS — J439 Emphysema, unspecified: Secondary | ICD-10-CM | POA: Diagnosis not present

## 2017-05-13 DIAGNOSIS — I2510 Atherosclerotic heart disease of native coronary artery without angina pectoris: Secondary | ICD-10-CM | POA: Diagnosis not present

## 2017-05-14 ENCOUNTER — Encounter: Payer: Self-pay | Admitting: Internal Medicine

## 2017-05-15 ENCOUNTER — Other Ambulatory Visit: Payer: Self-pay | Admitting: Internal Medicine

## 2017-05-22 ENCOUNTER — Other Ambulatory Visit: Payer: Self-pay | Admitting: Internal Medicine

## 2017-05-31 ENCOUNTER — Other Ambulatory Visit: Payer: Self-pay | Admitting: Internal Medicine

## 2017-06-03 DIAGNOSIS — J449 Chronic obstructive pulmonary disease, unspecified: Secondary | ICD-10-CM | POA: Diagnosis not present

## 2017-06-03 NOTE — Telephone Encounter (Signed)
See care note  

## 2017-06-13 ENCOUNTER — Other Ambulatory Visit: Payer: Self-pay | Admitting: Internal Medicine

## 2017-06-15 ENCOUNTER — Other Ambulatory Visit: Payer: Self-pay | Admitting: Internal Medicine

## 2017-06-29 IMAGING — CT CT CHEST HIGH RESOLUTION W/O CM
2 of 5 series · 14 of 36 positions shown, 17 images · non-contrast
Comparison: 01/10/2015 chest CT. 06/21/2013 high-resolution chest
CT.

CLINICAL DATA: 60-year-old female with shortness of breath for 3
weeks. Former smoker, quit 5 years prior. Clinical concern for
interstitial lung disease.

EXAM:
CT CHEST WITHOUT CONTRAST
TECHNIQUE: Multidetector CT imaging of the chest was performed following the
standard protocol without intravenous contrast. High resolution
imaging of the lungs, as well as inspiratory and expiratory imaging,
was performed.

[Series 2: thorax · axial · 0.77mm/px · z∈[-419,-179]mm · 11 of 138 slices shown, 14 images]
[im 12/138  mediastinal]
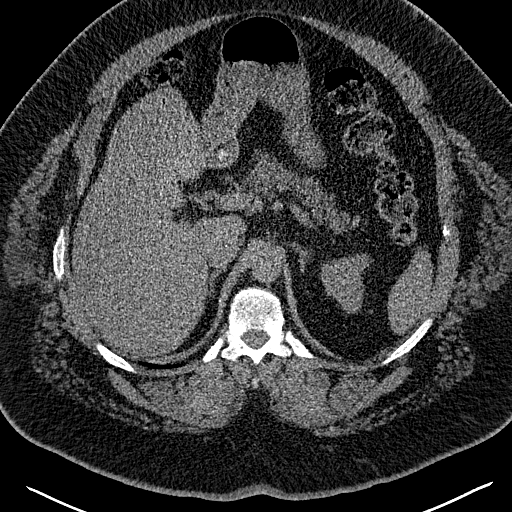
[im 12/138  lung]
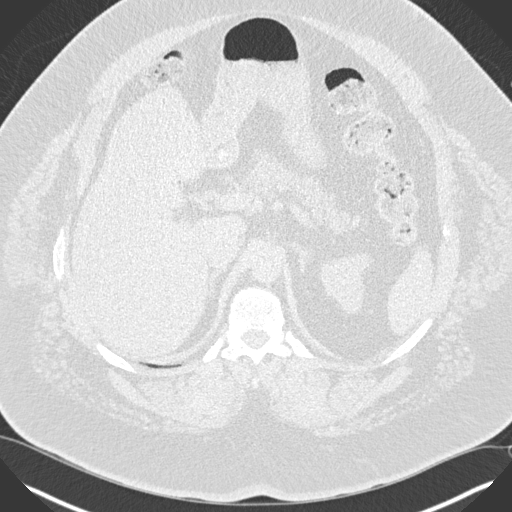
[im 24/138  lung]
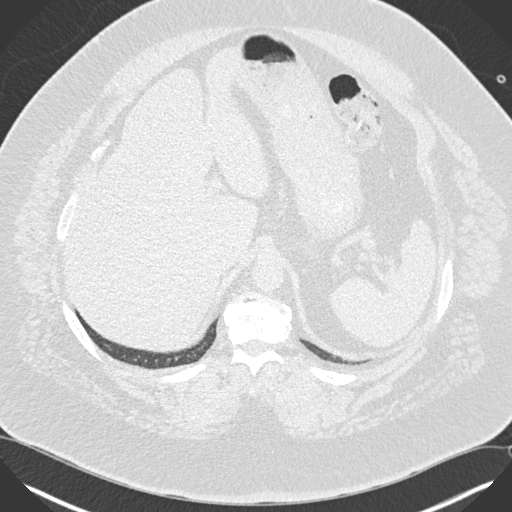
[im 36/138  lung]
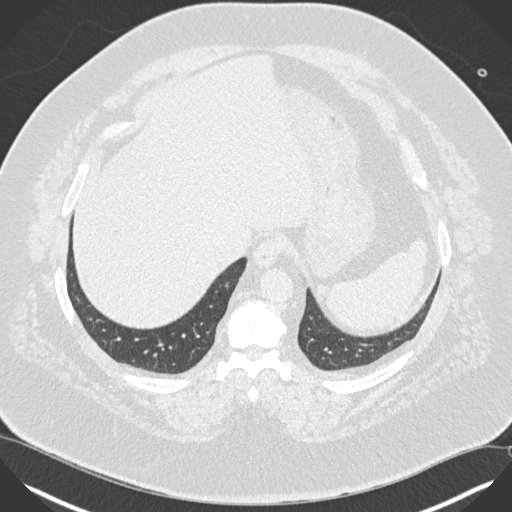
[im 48/138  lung]
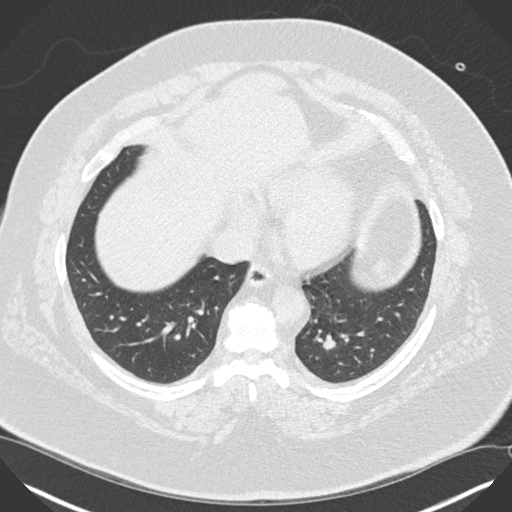
[im 60/138  mediastinal]
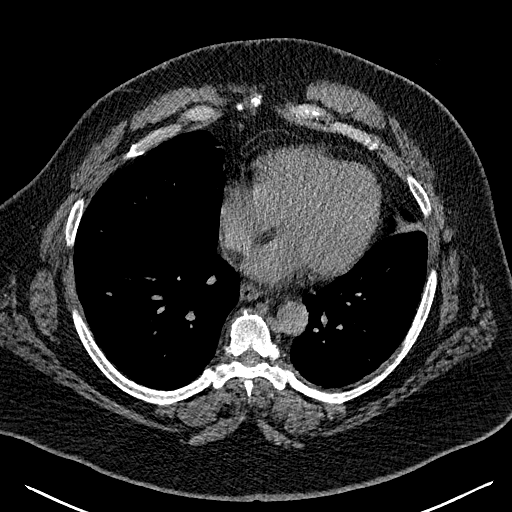
[im 60/138  lung]
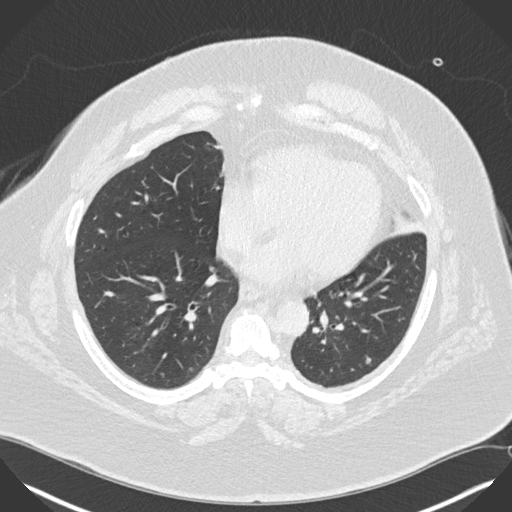
[im 72/138  lung]
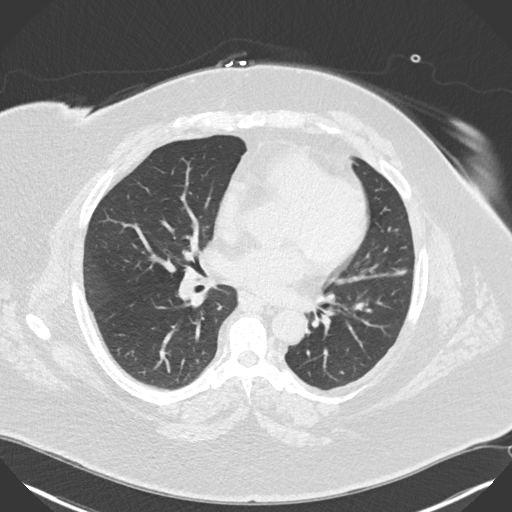
[im 84/138  lung]
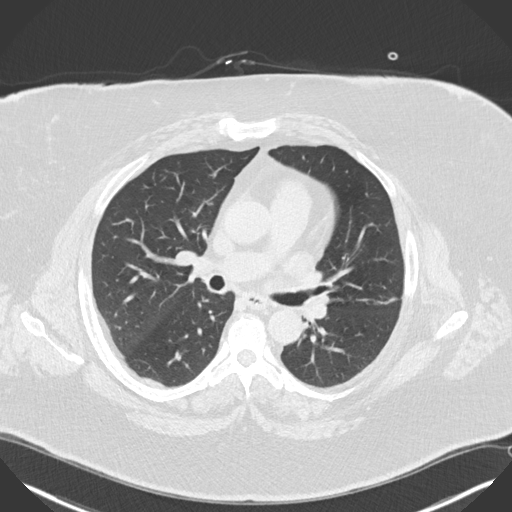
[im 96/138  lung]
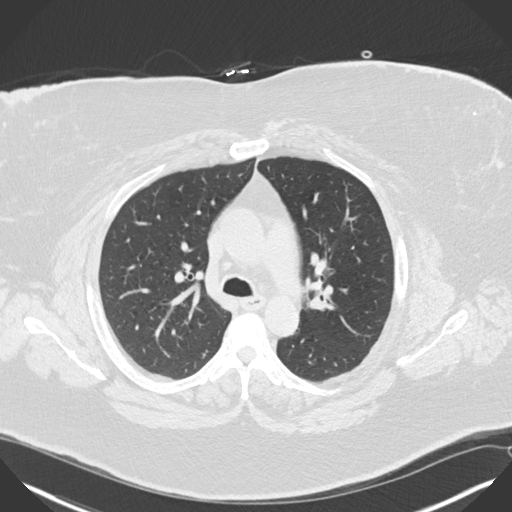
[im 108/138  mediastinal]
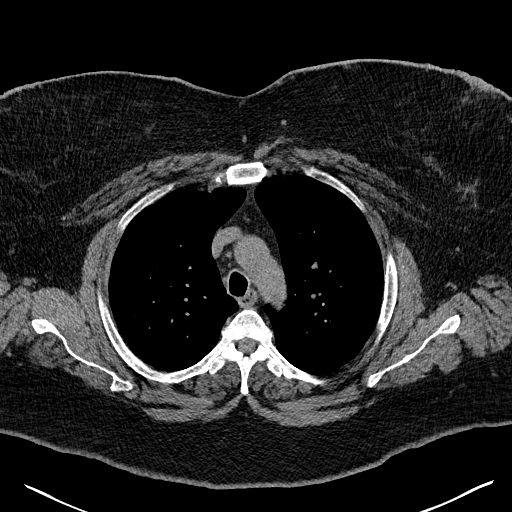
[im 108/138  lung]
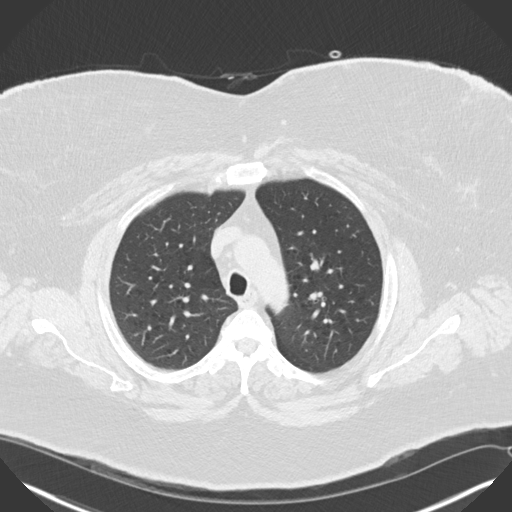
[im 120/138  lung]
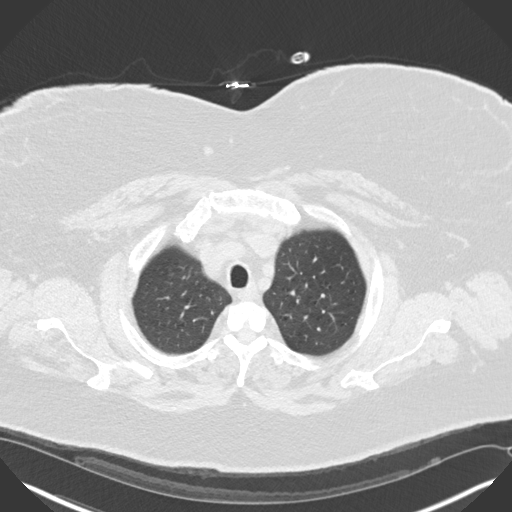
[im 132/138  lung]
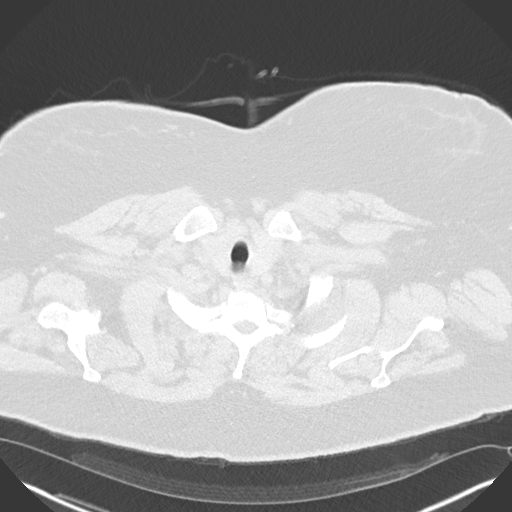

[Series 8: coronal · coronal · 0.59mm/px · 3 of 112 slices shown]
[im 23/112  lung]
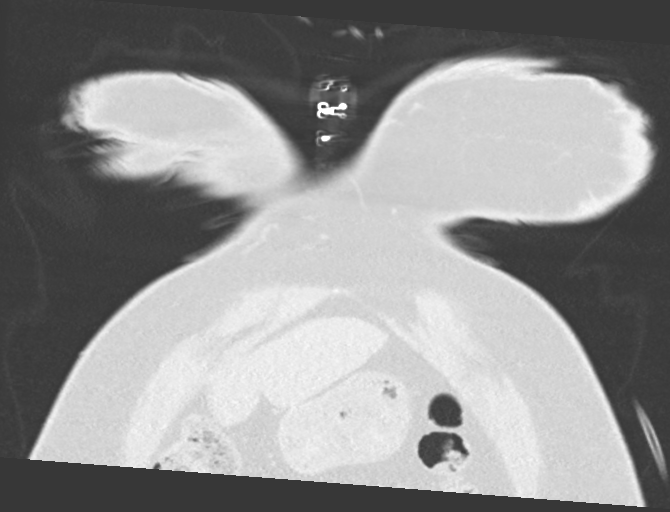
[im 45/112  lung]
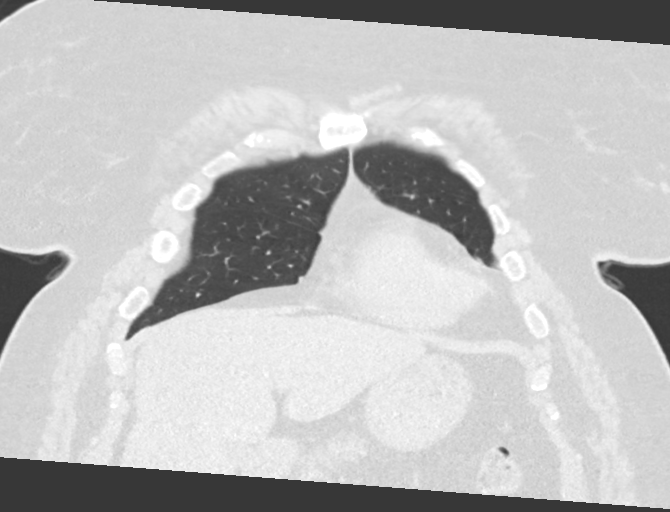
[im 67/112  lung]
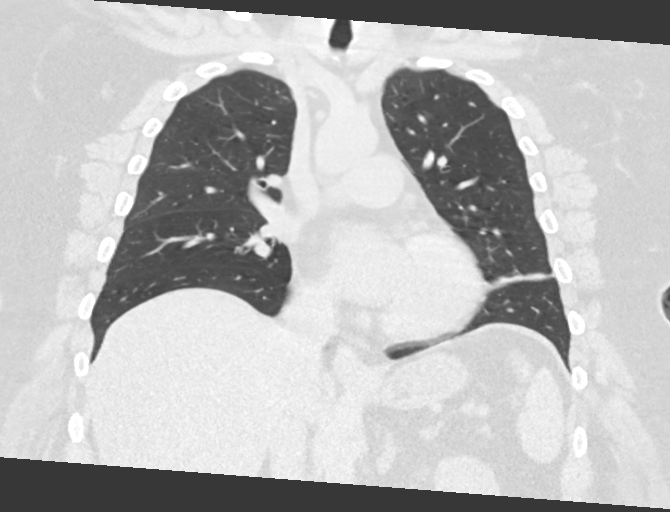

[14 of 36 positions shown; findings below may reference images not displayed]

FINDINGS: Cardiovascular: Normal heart size. No significant pericardial
fluid/thickening. Left anterior descending coronary atherosclerosis.
Great vessels are normal in course and caliber.

Mediastinum/Nodes: No discrete thyroid nodules. Unremarkable
esophagus. No pathologically enlarged axillary, mediastinal or gross
hilar lymph nodes, noting limited sensitivity for the detection of
hilar adenopathy on this noncontrast study.

Lungs/Pleura: No pneumothorax. No pleural effusion. Mild
centrilobular emphysema with mild diffuse bronchial wall thickening.
No acute consolidative airspace disease or lung masses. There are
greater than 20 solid pulmonary nodules scattered throughout both
lungs involving all lung lobes, on most of which are stable back to
the 08/13/2012, however a few of which have mildly increased in
size. For example the dominant 10 mm left lower lobe solid pulmonary
nodule (series 3/ image 91) measured 8 mm on 08/13/2012, mildly
increased. A 6 mm anterior left upper lobe solid pulmonary nodule
(series 3/ image 52) is mildly increased from 4 mm on 08/13/2012. A
6 mm posterior left lower lobe solid pulmonary nodule (series 3/
image 89) is increased from 3 mm on 08/13/2012. No significant
regions of subpleural reticulation, ground-glass attenuation,
traction bronchiectasis, architectural distortion or frank
honeycombing. No significant air trapping on the expiration
sequence.

Upper abdomen: Diffuse hepatic steatosis.

Musculoskeletal: No aggressive appearing focal osseous lesions.
Moderate thoracic spondylosis.
IMPRESSION: 1. Numerous (greater than 20) solid pulmonary nodules scattered
throughout both lungs involving all lung lobes, a few of which have
grown slowly since 08/13/2012 chest CT, including a dominant 10 mm
left lower lobe pulmonary nodule. Indolent neoplasm such as
adenocarcinoma or carcinoid cannot be excluded. Management options
include further characterization with PET-CT, biopsy of the dominant
10 mm left lower lobe nodule, or continued chest CT surveillance in
6 months, as clinically warranted.
2. No thoracic adenopathy.
3. No evidence of interstitial lung disease.
4. Mild centrilobular emphysema and mild diffuse bronchial wall
thickening, suggesting COPD.
5. One vessel coronary atherosclerosis.
6. Diffuse hepatic steatosis.

## 2017-07-03 DIAGNOSIS — J449 Chronic obstructive pulmonary disease, unspecified: Secondary | ICD-10-CM | POA: Diagnosis not present

## 2017-07-15 ENCOUNTER — Other Ambulatory Visit: Payer: Self-pay | Admitting: Internal Medicine

## 2017-07-20 DIAGNOSIS — R69 Illness, unspecified: Secondary | ICD-10-CM | POA: Diagnosis not present

## 2017-08-03 DIAGNOSIS — J449 Chronic obstructive pulmonary disease, unspecified: Secondary | ICD-10-CM | POA: Diagnosis not present

## 2017-08-05 ENCOUNTER — Ambulatory Visit: Payer: Medicare HMO | Admitting: Internal Medicine

## 2017-08-05 ENCOUNTER — Encounter: Payer: Self-pay | Admitting: Internal Medicine

## 2017-08-05 VITALS — BP 148/90 | HR 101 | Ht 63.0 in

## 2017-08-05 DIAGNOSIS — J45998 Other asthma: Secondary | ICD-10-CM | POA: Diagnosis not present

## 2017-08-05 DIAGNOSIS — G4733 Obstructive sleep apnea (adult) (pediatric): Secondary | ICD-10-CM

## 2017-08-05 DIAGNOSIS — J45909 Unspecified asthma, uncomplicated: Secondary | ICD-10-CM

## 2017-08-05 DIAGNOSIS — J449 Chronic obstructive pulmonary disease, unspecified: Secondary | ICD-10-CM | POA: Diagnosis not present

## 2017-08-05 MED ORDER — AZITHROMYCIN 250 MG PO TABS: ORAL_TABLET | ORAL | 11 refills | Status: DC

## 2017-08-05 MED ORDER — ALBUTEROL SULFATE HFA 108 (90 BASE) MCG/ACT IN AERS: INHALATION_SPRAY | RESPIRATORY_TRACT | 3 refills | Status: DC

## 2017-08-05 NOTE — Progress Notes (Signed)
[facility]     Assessment and Plan:  Severe emphysema, with COPD exacerbation and/or severe persistent asthma.  -Continue Anoro, DuoNeb's. -Continue on azithromycin 250 mg to be taken 3 days weekly. --Review of previous CBC shows eos have ranged from 0-4.4%.  --PFT 09/18/16, FEV1 equals 34%, with reversibility. --She is not able to attend pulm rehab due to transportation.  --Discussed addition of prednisone including possible side effects and worsening her diabetes and weight gain, she would declined. We discussed theophylline, but I explained that it is unlikely to provide benefit. Finally we decided to increase azithromycin to 250 mg daily.   Chronic respiratory failure. -Patient has hypoxemic respiratory failure, chronic. Continue oxygen, ambulatory, pulse dosing.  Obstructive sleep apnea. -intolerant of CPAP  Obesity --BMI=48; this precludes any possible lung transplantation discussion.   --Discussed that weight loss may be beneficial for her breathing.   Lung Nodules --these are unchanged over the past several years, however there are some new ground glass vs tree in bud changes in the RUL. We discussed that infection is a possibility however diagnosis would require bronchoscopy which she could not tolerate right now.  --Would therefore take a conservative approach the these findings and not do further testing, patient is in agreement.   Advance Care Planning: 08/05/17 This patient is on maximal therapy for COPD with continued respiratory and functional decline. She expressed that her mother lived to be 1673 and she would like to live that long. I told that I feel that her expected life expectancy is likely 6 months to 5 years. While 10 years would be in the realm of possibility, it would not be likely.  She expressed that she would not want to be maintained on life support if it was not likely that she could be taken off. I encouraged her to discuss this with  her family. I also consulted palliative care to help in these discussions.  15 minutes spent in discussion.   Orders Placed This Encounter  Procedures  . Amb Referral to Palliative Care      Date: 08/05/2017  MRN# [id number] Elizbeth SquiresDeborah Bentsen 28-Aug-1956   Elizbeth SquiresDeborah Carrozza is a 61 y.o. old female seen in follow up for chief complaint of  Chief Complaint  Patient presents with  . Follow-up    Pt is no longer on Cpap therapy.  . Emphysema    pt sob with any exhertion  . Cough  . Sleep Apnea    no longer on cpap     HPI:   The patient is a 61 year old female with a history of severe emphysema. She has had multiple exacerbations and multiple course of prednisone, including most recently in August 2018.Marland Kitchen. At last visit she was asked to continue Anoro, duonebs, oxygen, Cpap,  azithro 3 days every M, W, F. Anoro was changed to [employer] by [employer]. She is on rescue inhaler 2-3 times per day, and nebs 3 or 4 times per day.  She remains on azithro, she is no longer on CPAP due to lack of tolerance.   Since her last visit she has been taking azithro as directed, she has not been back to the hospital.  She continues on oxygen at 4L pulse dose when out of the house, at home she is on 4L constant at night as well.  She notes that her breathing is worse over the past few months. She has not smoked in 6  Years.   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  She is using anoro once every morning, she is doing duoneb about 3-4 times per day, proair as needed.   She is no longer on CPAP due to intolerance;   Images personally reviewed, CT chest from 05/13/17; some tree in bud type changes in the RUL. Mild right paratracheal adenopathy. Multiple lung nodules are stable from 08/11/16 and likely from 01/10/15.   chest x-ray 01/02/16, CT chest from 01/10/16: Trace chronic bronchitis, no significant emphysematous changes seen. Interstitial changes seen on the most  recent chest x-ray, however, these were not seen on the previous CT image  CT chest 08/11/16: Lungs are unremarkable other than some streaky atelectasis in the left lung.  Reviewed: Pulmonary function testing 09/18/16. FVC was 83% of predicted, FEV1 is 34% predicted, ratio was 37%. TLC 70%, ERV is 17%, RV to TLC ratio is elevated. DLCO was 41%. Overall, this appears consistent with severe emphysema with reversibility.    Medication:   Outpatient Encounter Medications as of 08/05/2017  Medication Sig  . ACCU-CHEK SMARTVIEW test strip TEST BLOOD SUGAR FOUR TIMES DAILY  . albuterol (PROAIR HFA) 108 (90 Base) MCG/ACT inhaler USE 2 INHALATIONS ORALLY   EVERY 6 HOURS AS NEEDED FORWHEEZING  . ALPRAZolam (XANAX) 0.5 MG tablet Take 1 tablet (0.5 mg total) by mouth at bedtime as needed.  Marland Kitchen. aspirin 81 MG EC tablet Take 81 mg by mouth daily.    Marland Kitchen. azithromycin (ZITHROMAX) 250 MG tablet TAKE 1 TABLET EVERY MONDAY, WEDNESDAY & FRIDAY  . butalbital-acetaminophen-caffeine (FIORICET, ESGIC) 50-325-40 MG tablet TAKE 1 TABLET BY MOUTH EVERY 4 HOURS AS NEEDED FOR HEADACHE  . chlorpheniramine-HYDROcodone (TUSSIONEX PENNKINETIC ER) 10-8 MG/5ML SUER 2.5 ml po qhs prn cough  . docusate sodium (COLACE) 100 MG capsule Take 200 mg by mouth daily. Reported on 09/20/2015  . fluticasone furoate-vilanterol (BREO ELLIPTA) 100-25 MCG/INH AEPB Inhale 1 puff into the lungs daily.  Marland Kitchen. glipiZIDE (GLUCOTROL) 5 MG tablet TAKE 1 TABLET BY MOUTH BEFORE BREAKFAST AND 2 TABLETS BEFORE DINNER  . glipiZIDE (GLUCOTROL) 5 MG tablet TAKE 1 TABLET BY MOUTH BEFORE BREAKFAST AND 2 TABLETS BEFORE DINNER  . insulin aspart (NOVOLOG FLEXPEN) 100 UNIT/ML FlexPen INJECT 12 UNITS            SUBCUTANEOUSLY 3 TIMES A   DAY BEFORE MEALS. OFFICE  . Insulin Pen Needle 29G X 5MM MISC 1 each by Does not apply route 3 (three) times daily. Dx:  . ipratropium-albuterol (DUONEB) 0.5-2.5 (3) MG/3ML SOLN USE 1 VIAL VIA NEBULIZER EVERY 6 HOURS AS NEEDED  . LEVEMIR  FLEXTOUCH 100 UNIT/ML Pen INJECT 50 UNITS INTO THE SKIN DAILY AT 10PM  . Magnesium 250 MG TABS Take 250 mg by mouth daily.  . pantoprazole (PROTONIX) 40 MG tablet TAKE 1 TABLET BY MOUTH EVERY DAY  . PARoxetine (PAXIL) 10 MG tablet TAKE 1 TABLET(10 MG) BY MOUTH EVERY MORNING  . PARoxetine (PAXIL) 20 MG tablet Take 1 tablet (20 mg total) by mouth daily. TAKE 1 TABLET(10 MG) BY MOUTH EVERY MORNING  . Potassium 99 MG TABS Take 99 mg by mouth once. Reported on 09/20/2015  . predniSONE (DELTASONE) 10 MG tablet Take 6 tablets x 1 day, then 5 tabs x 1 day, then 4 tabs x 1 day, then 3 tabs x 1 day, then 2 tabs x 1 day, then 1 tab x 1 day then stop.  . promethazine (PHENERGAN) 12.5 MG tablet TAKE 1 TABLET (12.5 MG TOTAL) BY MOUTH AT BEDTIME  AS NEEDED FOR NAUSEA OR VOMITING.  . rosuvastatin (CRESTOR) 40 MG tablet TAKE 1 TABLET BY MOUTH EVERY DAY  . Vitamin D, Ergocalciferol, (DRISDOL) 50000 units CAPS capsule TAKE ONE CAPSULE BY MOUTH ONE TIME PER WEEK  . Vitamin D, Ergocalciferol, (DRISDOL) 50000 units CAPS capsule TAKE ONE CAPSULE BY MOUTH ONE TIME PER WEEK   No facility-administered encounter medications on file as of 08/05/2017.      Allergies:  Ibuprofen; Oxycodone-acetaminophen; Percocet [oxycodone-acetaminophen]; Sulfa antibiotics; Vicodin [hydrocodone-acetaminophen]; and Aspirin  Review of Systems: Gen:  Denies  fever, sweats. HEENT: Denies blurred vision. Cvc:  No dizziness, chest pain or heaviness Resp:   Denies cough or sputum porduction. Gi: Denies swallowing difficulty, stomach pain.  Gu:  Denies bladder incontinence, burning urine Ext:   No Joint pain, stiffness. Skin: No skin rash, easy bruising. Endoc:  No polyuria, polydipsia. Psych: No depression, insomnia. Other:  All other systems were reviewed and found to be negative other than what is mentioned in the HPI.   Physical Examination:   VS: BP (!) 148/90 (BP Location: Left Arm, Cuff Size: Large)   Pulse (!) 101   Ht 5\' 3"   (1.6 m)   SpO2 94%   BMI 48.71 kg/m   General Appearance: No distress  Neuro:without focal findings,  speech normal,  HEENT: PERRLA, EOM intact. Pulmonary: normal breath sounds, decreased air entry bilaterally. CardiovascularNormal S1,S2.  No m/r/g.   Abdomen: Benign, Soft, non-tender. Renal:  No costovertebral tenderness  GU:  Not performed at this time. Endoc: No evident thyromegaly, no signs of acromegaly. Skin:   warm, no rash. Extremities: normal, no cyanosis, clubbing.   LABORATORY PANEL:   CBC No results for input(s): WBC, HGB, HCT, PLT in the last 168 hours. ------------------------------------------------------------------------------------------------------------------  Chemistries  No results for input(s): NA, K, CL, CO2, GLUCOSE, BUN, CREATININE, CALCIUM, MG, AST, ALT, ALKPHOS, BILITOT in the last 168 hours.  Invalid input(s): GFRCGP ------------------------------------------------------------------------------------------------------------------  Cardiac Enzymes No results for input(s): TROPONINI in the last 168 hours. ------------------------------------------------------------  RADIOLOGY:   No results found for this or any previous visit. Results for orders placed during the hospital encounter of 01/02/16  DG Chest 2 View   Narrative CLINICAL DATA:  Positive flu test 3 days ago, cough, chest congestion, shortness of breath, and fever since then, history of asthma-COPD, coronary artery disease, former smoker,  EXAM: CHEST  2 VIEW  COMPARISON:  Chest x-ray of September 04, 2015  FINDINGS: The lungs are well-expanded. The interstitial markings are increased bilaterally. There is no alveolar infiltrate. There is no pleural effusion. The heart and pulmonary vascularity are normal. The bony thorax exhibits no acute abnormality.  IMPRESSION: Increased interstitial markings bilaterally over baseline likely reflects acute bronchitic changes. Mild chronic  hyperinflation consistent with known asthma-COPD. There is no alveolar pneumonia nor CHF.   Electronically Signed   By: David  [place] M.D.   On: 01/02/2016 16:05    ------------------------------------------------------------------------------------------------------------------  Thank  you for allowing [facility] to assist in the care of your patient. Our recommendations are noted above.  Please contact us if we can be of further service.   Wells Guiles, MD.  [place] Office Number: [phone]  Santiago Glad, M.D.  Billy Fischer, M.D  08/05/2017

## 2017-08-05 NOTE — Patient Instructions (Signed)
Will consult palliative care, this is a service to help people with severe respiratory problems.   Will increase azithromycin to 1 tab once daily.

## 2017-08-10 ENCOUNTER — Ambulatory Visit: Payer: Managed Care, Other (non HMO)

## 2017-08-10 ENCOUNTER — Ambulatory Visit (INDEPENDENT_AMBULATORY_CARE_PROVIDER_SITE_OTHER): Payer: Medicare HMO | Admitting: Internal Medicine

## 2017-08-10 ENCOUNTER — Encounter: Payer: Self-pay | Admitting: Internal Medicine

## 2017-08-10 VITALS — BP 160/78 | HR 102 | Temp 97.9°F | Resp 17 | Ht 63.0 in | Wt 282.6 lb

## 2017-08-10 DIAGNOSIS — E118 Type 2 diabetes mellitus with unspecified complications: Secondary | ICD-10-CM

## 2017-08-10 DIAGNOSIS — G4733 Obstructive sleep apnea (adult) (pediatric): Secondary | ICD-10-CM

## 2017-08-10 DIAGNOSIS — E1165 Type 2 diabetes mellitus with hyperglycemia: Secondary | ICD-10-CM

## 2017-08-10 DIAGNOSIS — J9611 Chronic respiratory failure with hypoxia: Secondary | ICD-10-CM

## 2017-08-10 DIAGNOSIS — IMO0002 Reserved for concepts with insufficient information to code with codable children: Secondary | ICD-10-CM

## 2017-08-10 LAB — POCT GLYCOSYLATED HEMOGLOBIN (HGB A1C): Hemoglobin A1C: 8.3

## 2017-08-10 MED ORDER — FREESTYLE LIBRE 14 DAY SENSOR MISC: 1.0000 "application " | Freq: Four times a day (QID) | 1 refills | Status: DC

## 2017-08-10 MED ORDER — FREESTYLE LIBRE 14 DAY READER DEVI: 1.0000 "application " | Freq: Four times a day (QID) | 1 refills | Status: DC

## 2017-08-10 NOTE — Patient Instructions (Addendum)
Your diabetes is out of control  We need to address your diet and your insulin doses.   Check your sugars 2 times  daily after meals.   If the freestyle monitor is approved,  You can check it 4 times daily  And sedn me the readings  Referral to Caroline Welles our pharmacisit is in progress   DO NOT SKDevota PaceIP MEALS!  YOU WILL NEVER LOSE WEIGHT THAT WAY!  Vilma MeckelJimmy Dean now makes a frozen breakfast frittata and an egg sandwhich  that both can be microwaved in 2 minutes and is very low carb. Frittatas are similar to quiches without the crust  Recommend trying these low Carb high Protein premixed Shakes:   Premier Protein  Atkins Advantage Muscle Milk EAS AdvantEdge   All of these are available at [employer], [employer],  [employer], and [employer]  And taste good      We will try to get your pulse oximetry overnight study to assess your oxygen level at night

## 2017-08-10 NOTE — Progress Notes (Signed)
Subjective:  Patient ID: Catherine [name], female    DOB: 1955/10/20  Age: 61 y.o. MRN: [id number]  CC: The primary encounter diagnosis was Diabetes mellitus type 2, uncontrolled, with complications ([place]). Diagnoses of OSA (obstructive sleep apnea) and Chronic hypoxemic respiratory failure ([place]) were also pertinent to this visit.  HPI Day Catherine [name] presents for 6 month follow up uncontrolled diabetes.  Patient is not following a low glycemic index diet  Most days and taking all prescribed medications regularly without side effects.  Fasting sugars have been under less than 140 most of the time and post prandials have been under 180 except on rare occasions. Patient is not exercising due to severe oxygen depenent COPD and not inentionally trying to lose weight .  Patient has had an eye exam in the last 12 months and checks feet regularly for signs of infection.  Patient does not walk barefoot outside,  And denies an numbness tingling or burning in feet. Patient is up to date on all recommended vaccinations   Has been on prednisone  Multiple times since her last visit for COPD exacerbation ,, none since September.   Adjusts insulin dose for  starches : takes Novolog before every meal starting with 12 units base.   add 2 units units for potatoes or macaroni,  4 units for rice ,  Reduces to 8 units for low carb meal.   Breakfast:  Skips  Not treating sleep apnea due to mask intolerance   She was recently shocked and devastated to receive a prognosis of 6 months to 5 years from her new pulmonologist  Dr Ashby Dawes .  Her son nis with her today and wants my opinion.   Lab Results  Component Value Date   HGBA1C 8.3 08/10/2017     Outpatient Medications Prior to Visit  Medication Sig Dispense Refill  . ACCU-CHEK SMARTVIEW test strip TEST BLOOD SUGAR FOUR TIMES DAILY 100 each 12  . albuterol (PROAIR HFA) 108 (90 Base) MCG/ACT inhaler USE 2 INHALATIONS ORALLY   EVERY 6 HOURS AS NEEDED  FORWHEEZING 17 g 3  . ALPRAZolam (XANAX) 0.5 MG tablet Take 1 tablet (0.5 mg total) by mouth at bedtime as needed. 30 tablet 5  . aspirin 81 MG EC tablet Take 81 mg by mouth daily.      Marland Kitchen azithromycin (ZITHROMAX) 250 MG tablet Take once daily. 30 each 11  . butalbital-acetaminophen-caffeine (FIORICET, ESGIC) 50-325-40 MG tablet TAKE 1 TABLET BY MOUTH EVERY 4 HOURS AS NEEDED FOR HEADACHE 30 tablet 3  . docusate sodium (COLACE) 100 MG capsule Take 200 mg by mouth daily. Reported on 09/20/2015    . fluticasone furoate-vilanterol (BREO ELLIPTA) 100-25 MCG/INH AEPB Inhale 1 puff into the lungs daily. 60 each 5  . glipiZIDE (GLUCOTROL) 5 MG tablet TAKE 1 TABLET BY MOUTH BEFORE BREAKFAST AND 2 TABLETS BEFORE DINNER 270 tablet 2  . insulin aspart (NOVOLOG FLEXPEN) 100 UNIT/ML FlexPen INJECT 12 UNITS            SUBCUTANEOUSLY 3 TIMES A   DAY BEFORE MEALS. OFFICE 90 mL 2  . Insulin Pen Needle 29G X 5MM MISC 1 each by Does not apply route 3 (three) times daily. Dx: 200 each 1  . ipratropium-albuterol (DUONEB) 0.5-2.5 (3) MG/3ML SOLN USE 1 VIAL VIA NEBULIZER EVERY 6 HOURS AS NEEDED 1080 mL 2  . LEVEMIR FLEXTOUCH 100 UNIT/ML Pen INJECT 50 UNITS INTO THE SKIN DAILY AT 10PM 45 pen 3  . Magnesium 250 MG TABS Take 250  mg by mouth daily.    . pantoprazole (PROTONIX) 40 MG tablet TAKE 1 TABLET BY MOUTH EVERY DAY 30 tablet 2  . PARoxetine (PAXIL) 10 MG tablet TAKE 1 TABLET(10 MG) BY MOUTH EVERY MORNING 90 tablet 0  . Potassium 99 MG TABS Take 99 mg by mouth once. Reported on 09/20/2015    . promethazine (PHENERGAN) 12.5 MG tablet TAKE 1 TABLET (12.5 MG TOTAL) BY MOUTH AT BEDTIME AS NEEDED FOR NAUSEA OR VOMITING. 30 tablet 1  . rosuvastatin (CRESTOR) 40 MG tablet TAKE 1 TABLET BY MOUTH EVERY DAY 90 tablet 1  . Vitamin D, Ergocalciferol, (DRISDOL) 50000 units CAPS capsule TAKE ONE CAPSULE BY MOUTH ONE TIME PER WEEK 4 capsule 0  . Vitamin D, Ergocalciferol, (DRISDOL) 50000 units CAPS capsule TAKE ONE CAPSULE BY MOUTH ONE  TIME PER WEEK (Patient not taking: Reported on 08/10/2017) 4 capsule 0   No facility-administered medications prior to visit.     Review of Systems;  Patient denies headache, fevers, malaise, unintentional weight loss, skin rash, eye pain, sinus congestion and sinus pain, sore throat, dysphagia,  hemoptysis , cough, dyspnea, wheezing, chest pain, palpitations, orthopnea, edema, abdominal pain, nausea, melena, diarrhea, constipation, flank pain, dysuria, hematuria, urinary  Frequency, nocturia, numbness, tingling, seizures,  Focal weakness, Loss of consciousness,  Tremor, insomnia, depression, anxiety, and suicidal ideation.      Objective:  BP (!) 160/78 (BP Location: Left Arm, Patient Position: Sitting, Cuff Size: Large)   Pulse (!) 102   Temp 97.9 F (36.6 C) (Oral)   Resp 17   Ht 5' 3"  (1.6 m)   Wt 282 lb 9.6 oz (128.2 kg)   SpO2 94%   BMI 50.06 kg/m   BP Readings from Last 3 Encounters:  08/10/17 (!) 160/78  08/05/17 (!) 148/90  12/17/16 130/74    Wt Readings from Last 3 Encounters:  08/10/17 282 lb 9.6 oz (128.2 kg)  12/17/16 275 lb (124.7 kg)  11/12/16 273 lb (123.8 kg)    General appearance: alert, cooperative and appears stated age Ears: normal TM's and external ear canals both ears Throat: lips, mucosa, and tongue normal; teeth and gums normal Neck: no adenopathy, no carotid bruit, supple, symmetrical, trachea midline and thyroid not enlarged, symmetric, no tenderness/mass/nodules Back: symmetric, no curvature. ROM normal. No CVA tenderness. Lungs: clear to auscultation bilaterally Heart: regular rate and rhythm, S1, S2 normal, no murmur, click, rub or gallop Abdomen: soft, non-tender; bowel sounds normal; no masses,  no organomegaly Pulses: 2+ and symmetric Skin: Skin color, texture, turgor normal. No rashes or lesions Lymph nodes: Cervical, supraclavicular, and axillary nodes normal.  Lab Results  Component Value Date   HGBA1C 8.3 08/10/2017   HGBA1C 8.0  11/12/2016   HGBA1C 8.5 (H) 08/08/2016    Lab Results  Component Value Date   CREATININE 0.90 02/09/2017   CREATININE 0.86 12/17/2016   CREATININE 0.79 08/08/2016    Lab Results  Component Value Date   WBC 14.5 (H) 08/08/2016   HGB 13.6 08/08/2016   HCT 41.0 08/08/2016   PLT 330 08/08/2016   GLUCOSE 180 (H) 12/17/2016   CHOL 164 08/08/2016   TRIG 125 08/08/2016   HDL 64 08/08/2016   LDLDIRECT 155.0 04/21/2016   LDLCALC 75 08/08/2016   ALT 25 12/17/2016   AST 39 (H) 12/17/2016   NA 141 12/17/2016   K 3.7 12/17/2016   CL 104 12/17/2016   CREATININE 0.90 02/09/2017   BUN 10 12/17/2016   CO2 32 12/17/2016  TSH 0.628 01/04/2012   INR 0.9 07/22/2012   HGBA1C 8.3 08/10/2017   MICROALBUR <0.2 08/08/2016    Ct Chest Wo Contrast  Result Date: 05/13/2017 CLINICAL DATA:  Shortness of breath, on oxygen therapy. EXAM: CT CHEST WITHOUT CONTRAST TECHNIQUE: Multidetector CT imaging of the chest was performed following the standard protocol without IV contrast. COMPARISON:  08/11/2016 and 01/10/2015. FINDINGS: Cardiovascular: Coronary artery calcification. Heart size normal. No pericardial effusion. Mediastinum/Nodes: Mediastinal lymph nodes are not enlarged by CT size criteria. Hilar regions are difficult to evaluate without IV contrast. No axillary adenopathy. Esophagus is grossly unremarkable. Lungs/Pleura: Mild centrilobular emphysema. There is somewhat upper lobe predominant peribronchovascular ground-glass and nodularity, new. Mild scattered associated bronchiectasis/bronchiolectasis. Additional scattered pulmonary nodules, measure up to 7 mm, stable from 08/11/2016 and likely stable from 01/10/2015. No airspace consolidation or pleural fluid. Airway is unremarkable. Upper Abdomen: Visualized portions of the liver, gallbladder and right adrenal gland are unremarkable. There may be slight nodular thickening of the left adrenal gland. Visualized portions of the left kidney, spleen, pancreas,  stomach and bowel are grossly unremarkable. No upper abdominal adenopathy. Musculoskeletal: Degenerative changes in the spine. No worrisome lytic or sclerotic lesions. IMPRESSION: 1. Upper lobe predominant peribronchovascular ground-glass and nodularity, new from prior exams, with additional scattered stable pulmonary nodules, stable from 08/11/2016 and likely stable from 01/10/2015. 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 2. Coronary artery calcification. 3.  Emphysema (ICD10-J43.9). Electronically Signed   By: Lorin Picket M.D.   On: 05/13/2017 11:09    Assessment & Plan:   Problem List Items Addressed This Visit    Chronic hypoxemic respiratory failure ([place])    Reviewed  her prognosis  With her today and pointed out why the prognosis is so unpredictable  (comorbidities)      Diabetes mellitus type 2, uncontrolled, with complications ([place]) - Primary    Worsening control by A1c.  Patient advised to submit a log of her recent CBGS so her baseline insulin doses can be adjusted,   Lab Results  Component Value Date   HGBA1C 8.3 08/10/2017   Lab Results  Component Value Date   MICROALBUR <0.2 08/08/2016         Relevant Orders   POCT HgB A1C (Completed)   Amb Referral to Clinical Pharmacist   OSA (obstructive sleep apnea)    She is nonadherent due to mask intolerance and sleeps  on her side. Unclear if she has apneic events on he side,  Will order pulse oximetry overnight from [facility]         I am having Catherine [name] start on FREESTYLE LIBRE 14 DAY READER and FREESTYLE LIBRE 14 DAY SENSOR. I am also having her maintain her aspirin, docusate sodium, ACCU-CHEK SMARTVIEW, Potassium, Magnesium, butalbital-acetaminophen-caffeine, ALPRAZolam, insulin aspart, Insulin Pen Needle, LEVEMIR FLEXTOUCH, ipratropium-albuterol, glipiZIDE, fluticasone furoate-vilanterol, Vitamin D (Ergocalciferol), PARoxetine, pantoprazole, rosuvastatin, promethazine, albuterol, and  azithromycin.  Meds ordered this encounter  Medications  . Continuous Blood Gluc Receiver (FREESTYLE LIBRE 14 DAY READER) DEVI    Sig: 1 application by Does not apply route 4 (four) times daily.    Dispense:  1 Device    Refill:  1  . Continuous Blood Gluc Sensor (FREESTYLE LIBRE 14 DAY SENSOR) MISC    Sig: 1 application by Does not apply route 4 (four) times daily.    Dispense:  1 each    Refill:  1    Medications Discontinued During This Encounter  Medication Reason  . Vitamin D, Ergocalciferol, (  DRISDOL) 50000 units CAPS capsule Duplicate    Follow-up: Return in about 3 months (around 11/08/2017) for follow up diabetes.   Crecencio Mc, MD

## 2017-08-11 NOTE — Assessment & Plan Note (Signed)
She is nonadherent due to mask intolerance and sleeps  on her side. Unclear if she has apneic events on he side,  Will order pulse oximetry overnight from [facility]

## 2017-08-11 NOTE — Assessment & Plan Note (Signed)
Worsening control by A1c.  Patient advised to submit a log of her recent CBGS so her baseline insulin doses can be adjusted,   Lab Results  Component Value Date   HGBA1C 8.3 08/10/2017   Lab Results  Component Value Date   MICROALBUR <0.2 08/08/2016

## 2017-08-11 NOTE — Assessment & Plan Note (Signed)
Reviewed  her prognosis  With her today and pointed out why the prognosis is so unpredictable  (comorbidities)

## 2017-08-14 ENCOUNTER — Encounter: Payer: Self-pay | Admitting: *Deleted

## 2017-08-14 NOTE — Progress Notes (Signed)
Completed order form for Overnight pulse Oximetry for [facility] placed in Dr. Darrick Huntsmanullo quick sign.

## 2017-08-16 ENCOUNTER — Other Ambulatory Visit: Payer: Self-pay | Admitting: Internal Medicine

## 2017-08-18 ENCOUNTER — Other Ambulatory Visit: Payer: Self-pay | Admitting: Internal Medicine

## 2017-08-19 ENCOUNTER — Other Ambulatory Visit: Payer: Self-pay | Admitting: Internal Medicine

## 2017-08-21 NOTE — Telephone Encounter (Signed)
Refilled: 05/01/2017 Last OV: 08/10/2017 Next OV: not scheduled

## 2017-08-24 ENCOUNTER — Ambulatory Visit: Payer: Medicare HMO | Admitting: Pharmacist

## 2017-08-26 DIAGNOSIS — J449 Chronic obstructive pulmonary disease, unspecified: Secondary | ICD-10-CM | POA: Diagnosis not present

## 2017-08-31 ENCOUNTER — Ambulatory Visit: Payer: Medicare HMO | Admitting: Pharmacist

## 2017-09-02 DIAGNOSIS — J449 Chronic obstructive pulmonary disease, unspecified: Secondary | ICD-10-CM | POA: Diagnosis not present

## 2017-09-03 ENCOUNTER — Telehealth: Payer: Self-pay | Admitting: Internal Medicine

## 2017-09-03 NOTE — Telephone Encounter (Signed)
Overnight pulse oximetry was not indicative of need for supplemental oxygen so medicare will not pay

## 2017-09-04 NOTE — Telephone Encounter (Signed)
LMTCB. PEC may speak with pt.  

## 2017-09-07 NOTE — Telephone Encounter (Signed)
Please advise 

## 2017-09-07 NOTE — Telephone Encounter (Signed)
Pt called returning a call that the office made.   I read Dr. Melina Schoolsullo's message to her:   Overnight pulse oximetry was not indicative of need for suppllimental oxygen so medicare will not pay.  Pt states she is on 4L of O2 all the time.   She can't even walk to the bathroom without being short of breath even with oxygen.   So don't understand this message. Please clarify.

## 2017-09-09 NOTE — Telephone Encounter (Signed)
If she was wearing the oxygen during the test,  Then everything is fine.  Her levels are adequate,  Nothing to do

## 2017-09-09 NOTE — Telephone Encounter (Signed)
Please advise. Pt states that she is on 4L of O2 all the time.

## 2017-09-10 ENCOUNTER — Other Ambulatory Visit: Payer: Self-pay | Admitting: Nurse Practitioner

## 2017-09-10 DIAGNOSIS — K760 Fatty (change of) liver, not elsewhere classified: Secondary | ICD-10-CM

## 2017-09-10 DIAGNOSIS — Z86010 Personal history of colonic polyps: Secondary | ICD-10-CM | POA: Diagnosis not present

## 2017-09-10 DIAGNOSIS — J432 Centrilobular emphysema: Secondary | ICD-10-CM | POA: Diagnosis not present

## 2017-09-10 NOTE — Telephone Encounter (Signed)
LMTCB. Please transfer pt to our office.  

## 2017-09-13 NOTE — Progress Notes (Addendum)
S:    Chief Complaint  Patient presents with  . Medication Management    diabetes   Patient arrives on O2Sat with son. Presents for diabetes evaluation, education, and management at the request of Dr. Darrick Huntsman (referred on 08/10/2017). Last seen by primary care provider on 08/10/2017 - at that time patient was asked to monitor CBGs 2-4 times daily and bring in logs for review. She was also prescribed Freestyle Libre CGM system but reports not having trouble with sticking fingers and would rather not spend the money.  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 Inquires about OTC insulin.  Patient reports semi- adherence with medications.  Current diabetes medications include: novolog 12 units + SSI (reduces to 8 units for low CHO meal, adds 2u for potatoes or macaroni, adds 4u for rice), levemir 50 units nightly, glipizide 10 mg before breakfast, 10 mg before supper (self increased glipizide, has not seen a big effect on CBGs) Current hypertension medications include: none  Patient denies hypoglycemic events.  Patient reports she has  Patient reported dietary habits: Eats 3 meals/day Breakfast: Biscuit  Lunch:Chick fil a or bologna sandwich  Dinner: Salad, occasional [place] food  Snacks: Drinks:Diet [employer]   Patient reported exercise habits: none limited by O2 dependent COPD   Patient denies nocturia.  Patient denies pain/burning on urination.  Patient denies neuropathy. Patient denies visual changes. Patient reports self foot exams.    O:  Physical Exam  Constitutional: She appears well-developed and well-nourished.     Review of Systems  Respiratory: Positive for cough and shortness of breath (On continuous O2).   All other systems reviewed and are negative.    Lab  Results  Component Value Date   HGBA1C 8.3 08/10/2017   Lipid Panel     Component Value Date/Time   CHOL 164 08/08/2016 1644   CHOL 185 11/11/2011 0914   TRIG 125 08/08/2016 1644   TRIG 166 11/11/2011 0914   HDL 64 08/08/2016 1644   HDL 47 11/11/2011 0914   CHOLHDL 2.6 08/08/2016 1644   VLDL 25 08/08/2016 1644   VLDL 33 11/11/2011 0914   LDLCALC 75 08/08/2016 1644   LDLCALC 105 (H) 11/11/2011 0914   LDLDIRECT 155.0 04/21/2016 1020   12/17/2016 GFR = ~71 mg/dL  Home fasting CBG: 409-811 excursions 145, 217 2 hour post-prandial/random CBG: 139-188 excursions 247, 259   Clinical ASCVD: Yes ; Major ASCVD events: none High-risk conditions: DM, HTN  A/P: #Diabetes longstanding diagnosed currently improved per CBG log 2/2 improved dietary choices and documenting blood sugar. Patient denies hypoglycemic events and is able to verbalize appropriate hypoglycemia management plan. Patient reports semi-adherence with medication as she has been trying to make meds last to avoid buying (deductible). - Discussed most affordable insulin options - vials of insulin R and NPH would cost $50/20 days and would not allow her to work towards deductible, unfortunately. Newer insulins filled on insurance would be more expensive (~$80 per patient for first month of levemir) initially but would work towards deductible and would then be less expensive. -D/c levemir and glipizide -Start Catherine [name] 50 units once daily (prescribed U200 version for increased comfort and affordability). This is Tier 3 on her insurance as was levemir.  -Start Metformin 500 mg once daily. Selected IR version as XR version  is not covered on insurance formulary -Patient educated on purpose, proper use and potential adverse effects of Tresiba and metformin.  Following instruction patient verbalized understanding of treatment plan.  - Next A1C anticipated March 3rd 2019 or later  #ASCVD risk -secondary prevention in patient aged 64-75 with  DM, baseline LDL 70-189, stable ASCVD -high intensity statin indicated with LDL goal <70 mg/dL. Patient slightly above with last LDL of 75 mg/dL - Continued Aspirin 81 mg  - Continued rosuvastatin 40 mg.  - Can consider ezetimibe to further reduce LDL and optimize ASCVD risk factors  Written patient instructions provided.  Total time in face to face counseling 50 minutes.    Follow up in Pharmacist Clinic Visit one month.   Patient seen with Karna DupesMedina Rasul PharmD Candidate   Catherine [name], Pharm.D., BCPS, CPP PGY2 Ambulatory Care Pharmacy Resident Phone: [phone]   I have reviewed the above information and agree with above.   Catherine Dulleresa [name], MD

## 2017-09-14 ENCOUNTER — Ambulatory Visit: Payer: Medicare HMO

## 2017-09-14 ENCOUNTER — Ambulatory Visit (INDEPENDENT_AMBULATORY_CARE_PROVIDER_SITE_OTHER): Payer: Medicare HMO | Admitting: Pharmacist

## 2017-09-14 DIAGNOSIS — IMO0002 Reserved for concepts with insufficient information to code with codable children: Secondary | ICD-10-CM

## 2017-09-14 DIAGNOSIS — E118 Type 2 diabetes mellitus with unspecified complications: Secondary | ICD-10-CM | POA: Diagnosis not present

## 2017-09-14 DIAGNOSIS — E1165 Type 2 diabetes mellitus with hyperglycemia: Secondary | ICD-10-CM | POA: Diagnosis not present

## 2017-09-14 MED ORDER — METFORMIN HCL 500 MG PO TABS: 500.0000 mg | ORAL_TABLET | Freq: Every day | ORAL | 3 refills | Status: DC

## 2017-09-14 MED ORDER — INSULIN DEGLUDEC 200 UNIT/ML ~~LOC~~ SOPN: 50.0000 [IU] | PEN_INJECTOR | Freq: Every day | SUBCUTANEOUS | 6 refills | Status: DC

## 2017-09-14 NOTE — Assessment & Plan Note (Addendum)
#  Diabetes longstanding diagnosed currently improved per CBG log 2/2 improved dietary choices and documenting blood sugar. Patient denies hypoglycemic events and is able to verbalize appropriate hypoglycemia management plan. Patient reports semi-adherence with medication as she has been trying to make meds last to avoid buying (deductible). - Discussed most affordable insulin options - vials of insulin R and NPH would cost $50/20 days and would not allow her to work towards deductible, unfortunately. Newer insulins filled on insurance would be more expensive (~$80 per patient for first month of levemir) initially but would work towards deductible and would then be less expensive. -D/c levemir and glipizide -Start [name] 50 units once daily (prescribed U200 version for increased comfort and affordability). This is Tier 3 on her insurance as was levemir.  -Start Metformin 500 mg once daily. Selected IR version as XR version is not covered on insurance formulary -Patient educated on purpose, proper use and potential adverse effects of Tresiba and metformin.  Following instruction patient verbalized understanding of treatment plan.  - Next A1C anticipated March 3rd 2019 or later  #ASCVD risk -secondary prevention in patient aged 64-75 with DM, baseline LDL 70-189, stable ASCVD -high intensity statin indicated with LDL goal <70 mg/dL. Patient slightly above with last LDL of 75 mg/dL - Continued Aspirin 81 mg  - Continued rosuvastatin 40 mg.  - Can consider ezetimibe to further reduce LDL and optimize ASCVD risk factors

## 2017-09-14 NOTE — Patient Instructions (Addendum)
Thanks for coming to see us!   1. Stop taking glipizide.  2. Stop taking levemir. 3. Start taking Tresiba 50 units once daily 4. Start taking metformin 500 mg daily with food   Check your sugars first thing in the morning and after every meal (four times daily in total)  Bring in your blood sugar logs so we can review them..   I will call you next week to check on your blood sugars. Please make an appointment for 3-4 weeks from now.   Call me if you have any issues with the meds.

## 2017-09-15 NOTE — Telephone Encounter (Signed)
Spoke with Catherine [name] and informed her that Dr. Darrick Huntsmanullo wasn't aware that she was wearing her O2 at the time. But that since she was there is nothing that needs to be done. Catherine [name] gave a verbal understanding.

## 2017-09-18 ENCOUNTER — Telehealth: Payer: Self-pay | Admitting: Internal Medicine

## 2017-09-18 NOTE — Telephone Encounter (Signed)
[facility] calling to let us know the referral we sent to them for patient. Patient refused   FYI

## 2017-09-18 NOTE — Telephone Encounter (Signed)
[place] and [place] with palliative care.  Angelique BlonderDenise states pt declined palliative care.  Will route to RD as an FYI.

## 2017-09-22 ENCOUNTER — Other Ambulatory Visit: Payer: Self-pay | Admitting: Internal Medicine

## 2017-09-23 NOTE — Telephone Encounter (Signed)
No refill on Vit D until level has been checked.  Labs have been ordered since June

## 2017-09-23 NOTE — Telephone Encounter (Signed)
Refilled:08/21/2017 Last OV: 08/10/2017 Next OV: not scheduled

## 2017-09-24 DIAGNOSIS — K760 Fatty (change of) liver, not elsewhere classified: Secondary | ICD-10-CM | POA: Diagnosis not present

## 2017-09-24 DIAGNOSIS — Z86010 Personal history of colonic polyps: Secondary | ICD-10-CM | POA: Diagnosis not present

## 2017-09-25 NOTE — Telephone Encounter (Signed)
LMTCB. Please transfer pt to our office when she calls back.

## 2017-09-29 ENCOUNTER — Ambulatory Visit: Payer: Medicare HMO

## 2017-10-03 DIAGNOSIS — J449 Chronic obstructive pulmonary disease, unspecified: Secondary | ICD-10-CM | POA: Diagnosis not present

## 2017-10-06 ENCOUNTER — Other Ambulatory Visit: Payer: Self-pay | Admitting: Internal Medicine

## 2017-10-11 ENCOUNTER — Other Ambulatory Visit: Payer: Self-pay | Admitting: Internal Medicine

## 2017-10-16 ENCOUNTER — Other Ambulatory Visit: Payer: Self-pay | Admitting: Internal Medicine

## 2017-10-20 DIAGNOSIS — R69 Illness, unspecified: Secondary | ICD-10-CM | POA: Diagnosis not present

## 2017-10-27 DIAGNOSIS — R7989 Other specified abnormal findings of blood chemistry: Secondary | ICD-10-CM | POA: Diagnosis not present

## 2017-11-03 DIAGNOSIS — J449 Chronic obstructive pulmonary disease, unspecified: Secondary | ICD-10-CM | POA: Diagnosis not present

## 2017-11-13 ENCOUNTER — Other Ambulatory Visit: Payer: Self-pay | Admitting: Internal Medicine

## 2017-11-14 ENCOUNTER — Other Ambulatory Visit: Payer: Self-pay | Admitting: Internal Medicine

## 2017-12-01 DIAGNOSIS — J449 Chronic obstructive pulmonary disease, unspecified: Secondary | ICD-10-CM | POA: Diagnosis not present

## 2017-12-07 ENCOUNTER — Telehealth: Payer: Self-pay | Admitting: Pharmacist

## 2017-12-07 MED ORDER — GLIPIZIDE 5 MG PO TABS: ORAL_TABLET | ORAL | 3 refills | Status: DC

## 2017-12-07 NOTE — Telephone Encounter (Signed)
Called patient to check on CBGs and assess tolerance of medications. Patient reports she is in the coverage gap/donut hole and has been unable to afford insulin. States she has put herself back on glipizide 5 mg in AM, 10 mg in PM. States she discontinued metformin when she started back on glipizide as she thought it wasn't working. Denies GI upset with this medication.    Reports post-prandial CBG range: 157-202 after lunch, 195-111 after supper. Patient agrees to discuss finances with me but unfortunately does not qualify for LIS/Extra help application.   Patient agrees to restart metformin 500 mg daily and continue glipizide. Will d/c insulins and attempt to control on PO medications only.   Will follow up with patient in 1 week.   Allena Katzaroline E Wilburta Milbourn, Pharm.D., BCPS PGY2 Ambulatory Care Pharmacy Resident Phone: [phone]

## 2017-12-08 ENCOUNTER — Other Ambulatory Visit: Payer: Self-pay | Admitting: Internal Medicine

## 2017-12-10 ENCOUNTER — Other Ambulatory Visit: Payer: Self-pay | Admitting: Internal Medicine

## 2017-12-14 ENCOUNTER — Telehealth: Payer: Self-pay | Admitting: Pharmacist

## 2017-12-14 NOTE — Telephone Encounter (Signed)
Called patient to check on CBG control since restarting metformin and d/c insulins. Patient reports metformin caused constipation again after 1 dose so she has stopped taking it altogheter. Patient reports her CBGs have been 140s-170s fasting, 170s-220s after lunch, 140s-200s after supper.   Scheduled f/u appointment with me 4/15 at 3PM.   Allena Katzaroline E Arlyne Brandes, Pharm.D., BCPS PGY2 Ambulatory Care Pharmacy Resident Phone: [phone]

## 2017-12-21 ENCOUNTER — Ambulatory Visit (INDEPENDENT_AMBULATORY_CARE_PROVIDER_SITE_OTHER): Payer: Medicare HMO | Admitting: Pharmacist

## 2017-12-21 ENCOUNTER — Encounter: Payer: Self-pay | Admitting: Pharmacist

## 2017-12-21 VITALS — BP 124/86 | HR 85 | Wt 276.0 lb

## 2017-12-21 DIAGNOSIS — E118 Type 2 diabetes mellitus with unspecified complications: Secondary | ICD-10-CM | POA: Diagnosis not present

## 2017-12-21 DIAGNOSIS — E1165 Type 2 diabetes mellitus with hyperglycemia: Secondary | ICD-10-CM

## 2017-12-21 DIAGNOSIS — IMO0002 Reserved for concepts with insufficient information to code with codable children: Secondary | ICD-10-CM

## 2017-12-21 LAB — POCT GLYCOSYLATED HEMOGLOBIN (HGB A1C): HEMOGLOBIN A1C: 7.6

## 2017-12-21 MED ORDER — BASAGLAR KWIKPEN 100 UNIT/ML ~~LOC~~ SOPN: 50.0000 [IU] | PEN_INJECTOR | Freq: Every day | SUBCUTANEOUS | 0 refills | Status: DC

## 2017-12-21 NOTE — Progress Notes (Deleted)
S:     Chief Complaint  Patient presents with  . Medication Management    Diabetes  . Medication Assistance    Patient arrives in good spirits, ambulating without assistance, using portable O2 tank.  Presents for diabetes evaluation, education, and management at the request of Dr. Darrick Huntsmanullo (referred on 08/10/2017). Last seen by primary care provider on 08/10/2018. Last Rx Clinic visit on 09/14/17 - at that time patient's insulins were changed from levemir to Guinea-Bissauresiba and metformin was started, glipizide was stopped. Since then, patient has stopped filling/using Guinea-Bissauresiba regularly, gives 3-4 days/week as she is in the donut hole. Reports she went back on glipizide 1 tab in AM 2 tabs in PM, and has stopped metformin as she was unable to tolerated with constipation. Brings in CBG log for review today.   Insurance coverage/medication affordability: [employer] [place] plan - in donut hole, does not qualify for LIS/E  Patient {Actions; denies-reports:120008} adherence with medications.  Current diabetes medications include: *** Current hypertension medications include: ***   Patient {Actions; denies-reports:120008} hypoglycemic events.  Patient reported dietary habits: Eats *** meals/day Breakfast:*** Lunch:*** Dinner:*** Snacks:*** Drinks:***  Patient reported exercise habits:    Patient {Actions; denies-reports:120008} nocturia.  Patient {Actions; denies-reports:120008} pain/burning on urination.  Patient {Actions; denies-reports:120008} neuropathy. Patient {Actions; denies-reports:120008} visual changes. Patient {Actions; denies-reports:120008} self foot exams.   .[email or web address]   O:  Physical Exam  Constitutional: She appears well-developed and well-nourished.     Review of Systems  All other systems reviewed and are negative.    Lab Results  Component Value Date   HGBA1C 7.6 12/21/2017   Vitals:   12/21/17 1558  BP: 124/86  Pulse: 85    Lipid Panel     Component Value  Date/Time   CHOL 164 08/08/2016 1644   CHOL 185 11/11/2011 0914   TRIG 125 08/08/2016 1644   TRIG 166 11/11/2011 0914   HDL 64 08/08/2016 1644   HDL 47 11/11/2011 0914   CHOLHDL 2.6 08/08/2016 1644   VLDL 25 08/08/2016 1644   VLDL 33 11/11/2011 0914   LDLCALC 75 08/08/2016 1644   LDLCALC 105 (H) 11/11/2011 0914   LDLDIRECT 155.0 04/21/2016 1020    30d average - 170-260s 120-240s, lowest - 92   Clinical ASCVD: {YES/NO:21197}; Major ASCVD events: ACS in last 12 months, H/o MI, H/o ischemic stroke, symptomatic PAD; High-risk conditions: Age 64 or older, heterozygous FH, h/o coronary bypass or PCI, DM, HTN, CKD, current smoking, LDL > 100 despite max tolerated statin/ezetimibe, h/o CHF  ASCVD risk factors: age 64-75, family h/o premature ASCVD, persistently elevated LDL >/=160, CKD, metabolic syndrome, premature menopause/pre-eclampsia, inflammatory disease (psoriasis, RA, HIV), [place] ancestry, lipid/biomarkers, persistently elevated TG >/= 175, ABI < 0.9 10 year ASCVD risk: ***calcluate only if multiple ASCVD risk factors + aged 64-75  FYI - Goal LDL < 70 mg/dL in patients with ASCVD and in very-high risk ASCVD patients***  A/P: #Diabetes longstanding/newly diagnosed currently ***. Patient {Actions; denies-reports:120008} hypoglycemic events and is able to verbalize appropriate hypoglycemia management plan. Patient {Actions; denies-reports:120008} adherence with medication. Control is suboptimal due to ***. - *** - Counseled on sick day rules for *** - Next A1C anticipated ***.    #Medication assistance -   Does not qualify for LIS/extra help based on savings.    #ASCVD risk - primary***secondary prevention in patient aged 64***50-75 with***out DM, baseline LDL 70-189, multiple ASCVD risk factors***very high risk ASCVD***stable ASCVD - moderate***high intensity statin indicated.  - {Meds adjust:18428}  Aspirin *** mg  - {Meds adjust:18428} ***statin *** mg.   #Hypertension  longstanding/newly diagnosed currently ***.  Patient {Actions; denies-reports:120008} adherence with medication. Control is suboptimal due to ***. - ***  Written patient instructions provided.  Total time in face to face counseling *** minutes.    Follow up in Pharmacist Clinic Visit ***.   Patient seen with ***  Allena Katz, Pharm.D., BCPS, CPP PGY2 Ambulatory Care Pharmacy Resident Phone: [phone]

## 2017-12-21 NOTE — Patient Instructions (Addendum)
Good to see you! Your A1C is improved and at goal of <8. A1C today is 7.6!  Continue Tresiba 50 units daily. When this is gone, switch to Basaglar at 50 units daily. I gave you samples for this.   Continue Glipizide 1 pill in morning, 2 pills at night.   We are filling out paperwork for Basaglar to attempt to get it from the drug company. Please bring in proof of income between you and your husband. Please also get a print out from your pharmacy with your OUT OF POCKET EXPENSES for prescription medications from 09/08/2017 until present. Bring these and leave them for me upfront.   Call if you have any questions - [phone]

## 2017-12-22 NOTE — Assessment & Plan Note (Signed)
#  ASCVD risk -secondary prevention in patient aged 64-75 with DM, baseline LDL 70-189, stable ASCVD -high intensity statin indicated with LDL goal <70 mg/dL. Patient slightly above with last LDL of 75 mg/dL - Continued Aspirin 81 mg  - Continued rosuvastatin 40 mg.  - Can consider ezetimibe to further reduce LDL and optimize ASCVD risk factors

## 2017-12-22 NOTE — Progress Notes (Signed)
S:     Chief Complaint  Patient presents with  . Medication Management    Diabetes  . Medication Assistance    Patient arrives in good spirits, ambulating without assistance, using portable O2 tank.  Presents for diabetes evaluation, education, and management at the request of Dr. Darrick Huntsmanullo (referred on 08/10/2017). Last seen by primary care provider on 08/10/2018. Last Rx Clinic visit on 09/14/17 - at that time patient's insulins were changed from levemir to Guinea-Bissauresiba and metformin was started, glipizide was stopped. Since then, patient has stopped filling/using Guinea-Bissauresiba regularly, gives 3-4 days/week as she is in the donut hole. Reports she went back on glipizide 1 tab in AM 2 tabs in PM, and has stopped metformin as she was unable to tolerated with constipation. Brings in CBG log for review today.   Insurance coverage/medication affordability: [employer] [place] plan - in donut hole, does not qualify for LIS/Extra help per patient 2/2 savings.   Patient denies adherence with medications.  Current diabetes medications include: glipizide 5 mg with breakfast, 10 mg with supper, Tresiba 50 units daily 3-4 days/week  Patient denies hypoglycemic events.  Patient reported dietary habits: Eats 3 meals/day  Patient reported exercise habits: none, limited by O2 dependence   O:  Physical Exam  Constitutional: She appears well-developed and well-nourished.     Review of Systems  All other systems reviewed and are negative.    Lab Results  Component Value Date   HGBA1C 7.6 12/21/2017   Vitals:   12/21/17 1558  BP: 124/86  Pulse: 85    Lipid Panel     Component Value Date/Time   CHOL 164 08/08/2016 1644   CHOL 185 11/11/2011 0914   TRIG 125 08/08/2016 1644   TRIG 166 11/11/2011 0914   HDL 64 08/08/2016 1644   HDL 47 11/11/2011 0914   CHOLHDL 2.6 08/08/2016 1644   VLDL 25 08/08/2016 1644   VLDL 33 11/11/2011 0914   LDLCALC 75 08/08/2016 1644   LDLCALC 105 (H) 11/11/2011 0914   LDLDIRECT  155.0 04/21/2016 1020    30d average - 170-260s 120-240s, lowest - 92 Clinical ASCVD: Yes ; Major ASCVD events: none High-risk conditions: DM, HTN   A/P: #Diabetes longstanding currently controlled as evidenced by A1C <8% and CBGs on review of meter. Intolerant of metformin. Glycemic control worse on days AFTER patient skips Guinea-Bissauresiba dose, attributable to long t 1/2 of Tresiba. Patient denies hypoglycemic events and is able to verbalize appropriate hypoglycemia management plan. Patient denies adherence with medication. Control is suboptimal due to medication nonadherence, limited by financial state. - A1C today - improved to 7.6% - Continue Tresiba 50 units daily until gone, then start basaglar 50 units daily (samples provided).  - Continue glipizide 5 mg with breakfast, 10 mg with supper - Next A1C anticipated 03/22/18 or later  #Medication Assistance - patient is in donut hole and is interested in applying for medication assistance through manufacturer programs for the following medications: Basaglar. Not eligible for LIS/Extra help. Patient to bring financial documentation and out-of-pocket pharmacy spend proof into clinic to complete application for Basaglar.  Below still required to complete application: PATIENT:  [x ] Completed and signed application [x ] Letter of hardship [pt to bring ] Financial information [x ] Insurance card information   PHARMACY: [pt to bring ] Out of pocket drug spend year-to-date  PROVIDER::  Rx information -  [x ] Basaglar Signed application - [well give to Dr. Darrick Huntsmanullo to sign once patient has  brought in finances ] Basaglar  #ASCVD risk -secondary prevention in patient aged 16-75 with DM, baseline LDL 70-189, stable ASCVD -high intensity statin indicated with LDL goal <70 mg/dL. Patient slightly above with last LDL of 75 mg/dL - Continued Aspirin 81 mg  - Continued rosuvastatin 40 mg.  - Can consider ezetimibe to further reduce LDL and optimize ASCVD risk  factors  Written patient instructions provided.  Total time in face to face counseling 45 minutes.    Follow up in Pharmacist Clinic Visit next week to bring in financial documentation.  Allena Katz, Pharm.D., BCPS, CPP PGY2 Ambulatory Care Pharmacy Resident Phone: [phone]

## 2017-12-22 NOTE — Assessment & Plan Note (Signed)
#  Diabetes longstanding currently controlled as evidenced by A1C <8% and CBGs on review of meter. Intolerant of metformin. Glycemic control worse on days AFTER patient skips Guinea-Bissauresiba dose, attributable to long t 1/2 of Tresiba. Patient denies hypoglycemic events and is able to verbalize appropriate hypoglycemia management plan. Patient denies adherence with medication. Control is suboptimal due to medication nonadherence, limited by financial state. - A1C today - improved to 7.6% - Continue Tresiba 50 units daily until gone, then start basaglar 50 units daily (samples provided).  - Continue glipizide 5 mg with breakfast, 10 mg with supper - Next A1C anticipated 03/22/18 or later  #Medication Assistance - patient is in donut hole and is interested in applying for medication assistance through manufacturer programs for the following medications: Basaglar. Not eligible for LIS/Extra help. Patient to bring financial documentation and out-of-pocket pharmacy spend proof into clinic to complete application for Basaglar.  Below still required to complete application: PATIENT:  [x ] Completed and signed application [x ] Letter of hardship [pt to bring ] Financial information [x ] Insurance card information   PHARMACY: [pt to bring ] Out of pocket drug spend year-to-date  PROVIDER::  Rx information -  [x ] Basaglar Signed application - [well give to Dr. Darrick Huntsmanullo to sign once patient has brought in finances ] Basaglar

## 2017-12-23 NOTE — Progress Notes (Signed)
  I have reviewed the above information and agree with above.   Syrus Nakama, MD 

## 2017-12-27 ENCOUNTER — Other Ambulatory Visit: Payer: Self-pay | Admitting: Internal Medicine

## 2018-01-01 DIAGNOSIS — J449 Chronic obstructive pulmonary disease, unspecified: Secondary | ICD-10-CM | POA: Diagnosis not present

## 2018-01-04 ENCOUNTER — Other Ambulatory Visit: Payer: Self-pay | Admitting: Internal Medicine

## 2018-01-15 DIAGNOSIS — R69 Illness, unspecified: Secondary | ICD-10-CM | POA: Diagnosis not present

## 2018-01-18 ENCOUNTER — Other Ambulatory Visit: Payer: Self-pay | Admitting: Internal Medicine

## 2018-01-31 DIAGNOSIS — J449 Chronic obstructive pulmonary disease, unspecified: Secondary | ICD-10-CM | POA: Diagnosis not present

## 2018-02-09 ENCOUNTER — Other Ambulatory Visit: Payer: Self-pay | Admitting: Internal Medicine

## 2018-02-11 NOTE — Telephone Encounter (Signed)
Fleet Contrasachel from [facility] pharmacy called to check on status of refill request

## 2018-02-14 ENCOUNTER — Other Ambulatory Visit: Payer: Self-pay | Admitting: Internal Medicine

## 2018-02-16 ENCOUNTER — Other Ambulatory Visit: Payer: Self-pay

## 2018-02-16 ENCOUNTER — Ambulatory Visit (INDEPENDENT_AMBULATORY_CARE_PROVIDER_SITE_OTHER): Payer: Medicare HMO

## 2018-02-16 VITALS — BP 130/68 | HR 95 | Temp 98.4°F | Resp 17 | Ht 62.25 in | Wt 278.8 lb

## 2018-02-16 DIAGNOSIS — Z0000 Encounter for general adult medical examination without abnormal findings: Secondary | ICD-10-CM | POA: Diagnosis not present

## 2018-02-16 NOTE — Patient Instructions (Addendum)
  Catherine [name] , Thank you for taking time to come for your Medicare Wellness Visit. I appreciate your ongoing commitment to your health goals. Please review the following plan we discussed and let me know if I can assist you in the future.   Follow up as needed.    Bring a copy of your Health Care Power of Attorney and/or Living Will to be scanned into chart once completed.  Have a great day!  These are the goals we discussed: Goals    . Increase physical activity     Chair exercises as demonstrated       This is a list of the screening recommended for you and due dates:  Health Maintenance  Topic Date Due  .  Hepatitis C: One time screening is recommended by Center for Disease Control  (CDC) for  adults born from [birth year] through 1967.   October 02, 1955  . HIV Screening  07/30/1969  . Tetanus Vaccine  12/23/2015  . Pneumococcal vaccine (2) 12/07/2016  . Eye exam for diabetics  01/29/2017  . Urine Protein Check  08/08/2017  . Flu Shot  04/08/2018  . Hemoglobin A1C  06/22/2018  . Complete foot exam   08/10/2018  . Mammogram  08/19/2018  . Colon Cancer Screening  09/25/2020

## 2018-02-16 NOTE — Progress Notes (Addendum)
Subjective:   Catherine [name] is a 62 y.o. female who presents for Medicare Annual (Subsequent) preventive examination.  Review of Systems:  No ROS.  Medicare Wellness Visit. Additional risk factors are reflected in the social history. Cardiac Risk Factors include: advanced age (>46men, >36 women);hypertension;diabetes mellitus;obesity (BMI >30kg/m2)     Objective:     Vitals: BP 130/68 (BP Location: Left Arm, Patient Position: Sitting, Cuff Size: Normal)   Pulse 95   Temp 98.4 F (36.9 C) (Oral)   Resp 17   Ht 5' 2.25" (1.581 m)   Wt 278 lb 12.8 oz (126.5 kg)   SpO2 95%   BMI 50.58 kg/m   Body mass index is 50.58 kg/m.  Advanced Directives 02/16/2018 08/08/2016 07/21/2016  Does Patient Have a Medical Advance Directive? No No No  Would patient like information on creating a medical advance directive? Yes (MAU/Ambulatory/Procedural Areas - Information given) No - Patient declined No - patient declined information    Tobacco Social History   Tobacco Use  Smoking Status Former Smoker  . Packs/day: 1.00  . Years: 40.00  . Pack years: 40.00  . Types: Cigarettes  . Last attempt to quit: 06/27/2011  . Years since quitting: 6.6  Smokeless Tobacco Never Used     Counseling given: Not Answered   Clinical Intake:  Pre-visit preparation completed: Yes  Pain : No/denies pain     Nutritional Status: BMI > 30  Obese Diabetes: Yes(Followed by pcp )  How often do you need to have someone help you when you read instructions, pamphlets, or other written materials from your doctor or pharmacy?: 1 - Never  Interpreter Needed?: No     Past Medical History:  Diagnosis Date  . Anxiety   . Asthma   . COPD (chronic obstructive pulmonary disease) (HCC)   . Depression   . Diabetes mellitus   . Gastritis and duodenitis June 2011   EGD  . GERD (gastroesophageal reflux disease)   . Hyperglycemia   . Hyperlipidemia   . Hypertension   . Leukocytosis   . Obesity (BMI  30-39.9)   . S/P cardiac catheterization March 2012   normal coronaries,  due to chest pain ([place])  . Screening for breast cancer Jul 28 2011   normal  . Screening for colon cancer June 2011   polyps found, next one due 2014 ([place])  . Tobacco abuse, in remission    quit oct during hospitalization    Past Surgical History:  Procedure Laterality Date  . ABDOMINAL HYSTERECTOMY    . ABDOMINAL HYSTERECTOMY    . BILATERAL OOPHORECTOMY  1995   and TAH.  noncancerous reasons  . CARDIAC CATHETERIZATION  11/22/2010   no significant disease  . HEMORRHOID SURGERY     Family History  Problem Relation Age of Onset  . Coronary artery disease Mother 22  . Heart disease Mother        CABG x5  . COPD Mother 31  . Breast cancer Mother   . Kidney disease Mother   . Heart failure Father   . Angina Father   . Diabetes Father   . Depression Father   . Stroke Father   . Dementia Father   . Asthma Brother   . Asthma Brother   . Diabetes Brother   . Hypertension Brother   . Cancer Sister        breast, lung, brain   Social History   Socioeconomic History  . Marital status: Married  Spouse name: Not on file  . Number of children: 4  . Years of education: Not on file  . Highest education level: Not on file  Occupational History  . Occupation: Producer, television/film/video: [employer]  Social Needs  . Financial resource strain: Not hard at all  . Food insecurity:    Worry: Never true    Inability: Never true  . Transportation needs:    Medical: No    Non-medical: No  Tobacco Use  . Smoking status: Former Smoker    Packs/day: 1.00    Years: 40.00    Pack years: 40.00    Types: Cigarettes    Last attempt to quit: 06/27/2011    Years since quitting: 6.6  . Smokeless tobacco: Never Used  Substance and Sexual Activity  . Alcohol use: No  . Drug use: No  . Sexual activity: Never  Lifestyle  . Physical activity:    Days per week: 0 days    Minutes per session: Not on file  . Stress:  Only a little  Relationships  . Social connections:    Talks on phone: Not on file    Gets together: Not on file    Attends religious service: Not on file    Active member of club or organization: Not on file    Attends meetings of clubs or organizations: Not on file    Relationship status: Not on file  Other Topics Concern  . Not on file  Social History Narrative  . Not on file    Outpatient Encounter Medications as of 02/16/2018  Medication Sig  . ACCU-CHEK SMARTVIEW test strip TEST BLOOD SUGAR FOUR TIMES DAILY  . albuterol (VENTOLIN HFA) 108 (90 Base) MCG/ACT inhaler USE 2 PUFFS EVERY 6 HOURS AS NEEDED FOR WHEEZING  . ALPRAZolam (XANAX) 0.5 MG tablet Take 1 tablet (0.5 mg total) by mouth at bedtime as needed.  Marland Kitchen aspirin 81 MG EC tablet Take 81 mg by mouth daily.    Marland Kitchen azithromycin (ZITHROMAX) 250 MG tablet Take once daily.  Marland Kitchen BREO ELLIPTA 100-25 MCG/INH AEPB TAKE 1 PUFF BY MOUTH EVERY DAY  . butalbital-acetaminophen-caffeine (FIORICET, ESGIC) 50-325-40 MG tablet TAKE 1 TABLET BY MOUTH EVERY 4 HOURS AS NEEDED FOR HEADACHE  . docusate sodium (COLACE) 100 MG capsule Take 200 mg by mouth daily. Reported on 09/20/2015  . glipiZIDE (GLUCOTROL) 5 MG tablet Take 5 mg by mouth in the mornings with breakfast and 10 mg by mouth in the evenings with supper  . glipiZIDE (GLUCOTROL) 5 MG tablet TAKE 1 TABLET BY MOUTH BEFORE BREAKFAST AND 2 TABLETS BEFORE DINNER  . Insulin Glargine (BASAGLAR KWIKPEN) 100 UNIT/ML SOPN Inject 0.5 mLs (50 Units total) into the skin at bedtime.  . Insulin Pen Needle 29G X MISC 1 each by Does not apply route 3 (three) times daily. Dx:  . ipratropium-albuterol (DUONEB) 0.5-2.5 (3) MG/3ML SOLN USE 1 VIAL VIA NEBULIZER EVERY 6 HOURS AS NEEDED  . pantoprazole (PROTONIX) 40 MG tablet TAKE 1 TABLET (40 MG TOTAL) BY MOUTH DAILY. PLEASE CALL OFFICE TO SCHEDULE A FOLLOW-UP WITH PCP.  Marland Kitchen PARoxetine (PAXIL) 10 MG tablet TAKE 1 TABLET(10 MG) BY MOUTH EVERY MORNING  . Potassium 99  MG TABS Take 99 mg by mouth once. Reported on 09/20/2015  . promethazine (PHENERGAN) 12.5 MG tablet TAKE 1 TABLET (12.5 MG TOTAL) BY MOUTH AT BEDTIME AS NEEDED FOR NAUSEA OR VOMITING.  . rosuvastatin (CRESTOR) 40 MG tablet TAKE 1 TABLET BY MOUTH EVERY  DAY  . Vitamin D, Ergocalciferol, (DRISDOL) 50000 units CAPS capsule TAKE ONE CAPSULE BY MOUTH ONE TIME PER WEEK   No facility-administered encounter medications on file as of 02/16/2018.     Activities of Daily Living In your present state of health, do you have any difficulty performing the following activities: 02/16/2018  Hearing? N  Vision? N  Difficulty concentrating or making decisions? N  Walking or climbing stairs? Y  Dressing or bathing? N  Doing errands, shopping? 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 Y  Comment Husband assists  Using the Toilet? N  In the past six months, have you accidently leaked urine? N  Do you have problems with loss of bowel control? N  Managing your Medications? N  Managing your Finances? N  Housekeeping or managing your Housekeeping? Y  Comment Husband assists  Some recent data might be hidden    Patient Care Team: Sherlene Shamsullo, Teresa L, MD as PCP - General (Internal Medicine)    Assessment:   This is a routine wellness examination for Gavin PoundDeborah. The goal of the wellness visit is to assist the patient how to close the gaps in care and create a preventative care plan for the patient.   The roster of all physicians providing medical care to patient is listed in the Snapshot section of the chart.  Taking calcium VIT D as appropriate/Osteoporosis risk reviewed.    Safety issues reviewed; Smoke and carbon monoxide detectors in the home. No firearms in the home. Wears seatbelts when riding with others. No violence in the home.  They do not have excessive sun exposure.  Discussed the need for sun protection: hats, long sleeves and the use of sunscreen if there is significant sun  exposure.  Patient is alert, normal appearance, oriented to person/place/and time. Correctly identified the president of the [place] and recalls of 3/3 words. Performs simple calculations and can read correct time from watch face. Displays appropriate judgement.  No failures at ADL's or IADL's.  Ambulates with oxygen 4L.  BMI- discussed the importance of a healthy diet, water intake and the benefits of aerobic exercise. She is trying to eat a healthier low carb  diet, has adequate water intake and will be chair exercises in sets.  Demonstration and educational material provided.   Dental- every 6 months.  Diabetes- followed by pcp.  [facility]  Sleep patterns- Sleeps through the night . Wakes feeling rested. Oxygen in use at 4L.   Health maintenance gaps discussed.   Follow up with pcp scheduled.   Exercise Activities and Dietary recommendations Current Exercise Habits: The patient does not participate in regular exercise at present  Goals    . Increase physical activity     Chair exercises as demonstrated       Fall Risk Fall Risk  02/16/2018 08/08/2016 09/20/2015  Falls in the past year? No No No   Depression Screen PHQ 2/9 Scores 02/16/2018 08/08/2016 09/20/2015 09/07/2012  PHQ - 2 Score 0 0 0 2  PHQ- 9 Score - - - 4     Cognitive Function MMSE - Mini Mental State Exam 02/16/2018  Orientation to time 5  Orientation to Place 5  Registration 3  Attention/ Calculation 5  Recall 3  Language- name 2 objects 2  Language- repeat 1  Language- follow 3 step command 3  Language- read & follow direction 1  Write a sentence 1  Copy design 1  Total score  30     6CIT Screen 08/08/2016  What Year? 0 points  What month? 0 points  What time? 0 points  Count back from 20 0 points  Months in reverse 0 points  Repeat phrase 0 points  Total Score 0    Immunization History  Administered Date(s) Administered  . Hepatitis B 01/21/2012  . Influenza Whole  07/10/2011, 08/31/2012  . Influenza,inj,Quad PF,6+ Mos 06/12/2014, 10/06/2016  . Pneumococcal Conjugate-13 06/12/2014  . Pneumococcal Polysaccharide-23 12/08/2011  . Tdap 12/22/2005   Screening Tests Health Maintenance  Topic Date Due  . Hepatitis C Screening  05/19/1956  . HIV Screening  07/30/1969  . TETANUS/TDAP  12/23/2015  . PNEUMOCOCCAL POLYSACCHARIDE VACCINE (2) 12/07/2016  . OPHTHALMOLOGY EXAM  01/29/2017  . URINE MICROALBUMIN  08/08/2017  . INFLUENZA VACCINE  04/08/2018  . HEMOGLOBIN A1C  06/22/2018  . FOOT EXAM  08/10/2018  . MAMMOGRAM  08/19/2018  . COLONOSCOPY  09/25/2020      Plan:    End of life planning; Advance aging; Advanced directives discussed. Copy of current HCPOA/Living Will upon completion.    I have personally reviewed and noted the following in the patient's chart:   . Medical and social history . Use of alcohol, tobacco or illicit drugs  . Current medications and supplements . Functional ability and status . Nutritional status . Physical activity . Advanced directives . List of other physicians . Hospitalizations, surgeries, and ER visits in previous 12 months . Vitals . Screenings to include cognitive, depression, and falls . Referrals and appointments  In addition, I have reviewed and discussed with patient certain preventive protocols, quality metrics, and best practice recommendations. A written personalized care plan for preventive services as well as general preventive health recommendations were provided to patient.     OBrien-Blaney, Lamontae Ricardo L, LPN  1/61/0960    I have reviewed the above information and agree with above.   Duncan Dull, MD

## 2018-03-03 DIAGNOSIS — G8929 Other chronic pain: Secondary | ICD-10-CM | POA: Diagnosis not present

## 2018-03-03 DIAGNOSIS — R69 Illness, unspecified: Secondary | ICD-10-CM | POA: Diagnosis not present

## 2018-03-03 DIAGNOSIS — E1165 Type 2 diabetes mellitus with hyperglycemia: Secondary | ICD-10-CM | POA: Diagnosis not present

## 2018-03-03 DIAGNOSIS — J449 Chronic obstructive pulmonary disease, unspecified: Secondary | ICD-10-CM | POA: Diagnosis not present

## 2018-03-03 DIAGNOSIS — J9610 Chronic respiratory failure, unspecified whether with hypoxia or hypercapnia: Secondary | ICD-10-CM | POA: Diagnosis not present

## 2018-03-03 DIAGNOSIS — J439 Emphysema, unspecified: Secondary | ICD-10-CM | POA: Diagnosis not present

## 2018-03-03 DIAGNOSIS — E785 Hyperlipidemia, unspecified: Secondary | ICD-10-CM | POA: Diagnosis not present

## 2018-03-03 DIAGNOSIS — Z794 Long term (current) use of insulin: Secondary | ICD-10-CM | POA: Diagnosis not present

## 2018-03-03 DIAGNOSIS — Z6841 Body Mass Index (BMI) 40.0 and over, adult: Secondary | ICD-10-CM | POA: Diagnosis not present

## 2018-03-23 ENCOUNTER — Encounter: Payer: Self-pay | Admitting: Internal Medicine

## 2018-03-23 ENCOUNTER — Ambulatory Visit (INDEPENDENT_AMBULATORY_CARE_PROVIDER_SITE_OTHER): Payer: Medicare HMO | Admitting: Internal Medicine

## 2018-03-23 VITALS — BP 126/74 | HR 81 | Temp 97.7°F | Resp 18 | Ht 62.25 in | Wt 273.8 lb

## 2018-03-23 DIAGNOSIS — J9611 Chronic respiratory failure with hypoxia: Secondary | ICD-10-CM | POA: Diagnosis not present

## 2018-03-23 DIAGNOSIS — E559 Vitamin D deficiency, unspecified: Secondary | ICD-10-CM | POA: Diagnosis not present

## 2018-03-23 DIAGNOSIS — E662 Morbid (severe) obesity with alveolar hypoventilation: Secondary | ICD-10-CM

## 2018-03-23 DIAGNOSIS — J432 Centrilobular emphysema: Secondary | ICD-10-CM | POA: Diagnosis not present

## 2018-03-23 DIAGNOSIS — I10 Essential (primary) hypertension: Secondary | ICD-10-CM | POA: Diagnosis not present

## 2018-03-23 DIAGNOSIS — IMO0002 Reserved for concepts with insufficient information to code with codable children: Secondary | ICD-10-CM

## 2018-03-23 DIAGNOSIS — E785 Hyperlipidemia, unspecified: Secondary | ICD-10-CM | POA: Diagnosis not present

## 2018-03-23 DIAGNOSIS — E118 Type 2 diabetes mellitus with unspecified complications: Secondary | ICD-10-CM | POA: Diagnosis not present

## 2018-03-23 DIAGNOSIS — E1165 Type 2 diabetes mellitus with hyperglycemia: Secondary | ICD-10-CM | POA: Diagnosis not present

## 2018-03-23 MED ORDER — INSULIN NPH ISOPHANE & REGULAR (70-30) 100 UNIT/ML ~~LOC~~ SUSP: SUBCUTANEOUS | 11 refills | Status: DC

## 2018-03-23 MED ORDER — "INSULIN SYRINGE-NEEDLE U-100 31G X 15/64"" 0.3 ML MISC": 2 refills | Status: DC

## 2018-03-23 MED ORDER — BUTALBITAL-APAP-CAFFEINE 50-325-40 MG PO TABS
ORAL_TABLET | ORAL | 3 refills | Status: DC
Start: 2018-03-23 — End: 2018-09-22

## 2018-03-23 NOTE — Patient Instructions (Addendum)
We are going to start twice daily insulin in the mixed form  ("70/30") .    We will start with 15 units at breakfast and  20 units at dinnertime  We need to check a fasting sugar  And a  Pre dinner sugar  For the  next week  And send me the readings via mychart     Your oxygen is low despite using oxygen because you are developing  "obesity hypoventilation syndrome."  This is dangerous and makes you sleepy.   I want you to get up and walk  for 5 minutes  At least 3  Times daily   I'll make the referral to Synthia Innocentave Simonds, MD.  He is the best pulmonologist around and he is now in [place]

## 2018-03-23 NOTE — Progress Notes (Signed)
Subjective:  Patient ID: Catherine [name], female    DOB: December 31, 1955  Age: 62 y.o. MRN: [id number]  CC: The primary encounter diagnosis was Centrilobular emphysema (Catherine [name]). Diagnoses of Obesity hypoventilation syndrome (Catherine [name]), Essential hypertension, Diabetes mellitus type 2, uncontrolled, with complications (Catherine [name]), Hyperlipidemia, unspecified hyperlipidemia type, Vitamin D deficiency, and Chronic hypoxemic respiratory failure (Catherine [name]) were also pertinent to this visit.  HPI Catherine [name] presents for diabetes follow up.  She was last seen in April by Catherine [name],  And her a1c had improved to 7.6  Catherine [name] was in use and transiton to [place] was anticipated. However patient states that she has been out of insulin for several weeks due to the cost  Of the insulin.  She did not call to let us know she was having trouble filling her medication   Her CBG's have been elevated  from 180 to 260  During checks of  2 hr post lunch and 2 hr post dinner.   Diet reviewed:  She eats Atkins bar for breakfast or eating egg muffin (breadless) . Salad and vegetables  No potatoes or rice. Drinks one diet mtn dew daily the rest of the day she drinks water.   OSA:   She reprots hypersomnolence, daytime. Not wearing CPAP despite diagnosis of OSA due to intolerance of mask .   Does not want to see Catherine [name] again because he told her that her prognosis was under a year and she did not understand why .       Outpatient Medications Prior to Visit  Medication Sig Dispense Refill  . ACCU-CHEK SMARTVIEW test strip TEST BLOOD SUGAR FOUR TIMES DAILY 100 each 12  . albuterol (VENTOLIN HFA) 108 (90 Base) MCG/ACT inhaler USE 2 PUFFS EVERY 6 HOURS AS NEEDED FOR WHEEZING 18 Inhaler 0  . ALPRAZolam (XANAX) 0.5 MG tablet Take 1 tablet (0.5 mg total) by mouth at bedtime as needed. 30 tablet 5  . aspirin 81 MG EC tablet Take 81 mg by mouth daily.      Marland Kitchen azithromycin (ZITHROMAX) 250 MG tablet Take once daily. 30 each  11  . BREO ELLIPTA 100-25 MCG/INH AEPB TAKE 1 PUFF BY MOUTH EVERY DAY 60 each 5  . docusate sodium (COLACE) 100 MG capsule Take 200 mg by mouth daily. Reported on 09/20/2015    . glipiZIDE (GLUCOTROL) 5 MG tablet Take 5 mg by mouth in the mornings with breakfast and 10 mg by mouth in the evenings with supper 90 tablet 3  . glipiZIDE (GLUCOTROL) 5 MG tablet TAKE 1 TABLET BY MOUTH BEFORE BREAKFAST AND 2 TABLETS BEFORE DINNER 270 tablet 2  . Insulin Glargine (BASAGLAR KWIKPEN) 100 UNIT/ML SOPN Inject 0.5 mLs (50 Units total) into the skin at bedtime. 2 pen 0  . Insulin Pen Needle 29G X 5MM MISC 1 each by Does not apply route 3 (three) times daily. Dx: 200 each 1  . ipratropium-albuterol (DUONEB) 0.5-2.5 (3) MG/3ML SOLN USE 1 VIAL VIA NEBULIZER EVERY 6 HOURS AS NEEDED 1080 mL 2  . pantoprazole (PROTONIX) 40 MG tablet TAKE 1 TABLET (40 MG TOTAL) BY MOUTH DAILY. PLEASE CALL OFFICE TO SCHEDULE A FOLLOW-UP WITH PCP. 90 tablet 1  . PARoxetine (PAXIL) 10 MG tablet TAKE 1 TABLET(10 MG) BY MOUTH EVERY MORNING 90 tablet 1  . Potassium 99 MG TABS Take 99 mg by mouth once. Reported on 09/20/2015    . promethazine (PHENERGAN) 12.5 MG tablet TAKE 1 TABLET (12.5 MG TOTAL) BY MOUTH AT BEDTIME AS NEEDED  FOR NAUSEA OR VOMITING. 30 tablet 1  . rosuvastatin (CRESTOR) 40 MG tablet TAKE 1 TABLET BY MOUTH EVERY DAY 90 tablet 1  . Vitamin D, Ergocalciferol, (DRISDOL) 50000 units CAPS capsule TAKE ONE CAPSULE BY MOUTH ONE TIME PER WEEK 4 capsule 2  . butalbital-acetaminophen-caffeine (FIORICET, ESGIC) 50-325-40 MG tablet TAKE 1 TABLET BY MOUTH EVERY 4 HOURS AS NEEDED FOR HEADACHE 30 tablet 3  . metFORMIN (GLUCOPHAGE) 500 MG tablet TAKE 1 TABLET BY MOUTH EVERY DAY WITH BREAKFAST  3   No facility-administered medications prior to visit.     Review of Systems;  Patient denies headache, fevers, malaise, unintentional weight loss, skin rash, eye pain, sinus congestion and sinus pain, sore throat, dysphagia,  hemoptysis , cough,  dyspnea, wheezing, chest pain, palpitations, orthopnea, edema, abdominal pain, nausea, melena, diarrhea, constipation, flank pain, dysuria, hematuria, urinary  Frequency, nocturia, numbness, tingling, seizures,  Focal weakness, Loss of consciousness,  Tremor, insomnia, depression, anxiety, and suicidal ideation.      Objective:  BP 126/74 (BP Location: Left Arm, Patient Position: Sitting, Cuff Size: Large)   Pulse 81   Temp 97.7 F (36.5 C) (Oral)   Resp 18   Ht 5' 2.25" (1.581 m)   Wt 273 lb 12.8 oz (124.2 kg)   SpO2 (!) 89% Comment: 4L O2  BMI 49.68 kg/m   BP Readings from Last 3 Encounters:  03/23/18 126/74  02/16/18 130/68  12/21/17 124/86    Wt Readings from Last 3 Encounters:  03/23/18 273 lb 12.8 oz (124.2 kg)  02/16/18 278 lb 12.8 oz (126.5 kg)  12/21/17 276 lb (125.2 kg)    General appearance: alert, cooperative and appears stated age Ears: normal TM's and external ear canals both ears Throat: lips, mucosa, and tongue normal; teeth and gums normal Neck: no adenopathy, no carotid bruit, supple, symmetrical, trachea midline and thyroid not enlarged, symmetric, no tenderness/mass/nodules Back: symmetric, no curvature. ROM normal. No CVA tenderness. Lungs: clear to auscultation bilaterally Heart: regular rate and rhythm, S1, S2 normal, no murmur, click, rub or gallop Abdomen: soft, non-tender; bowel sounds normal; no masses,  no organomegaly Pulses: 2+ and symmetric Skin: Skin color, texture, turgor normal. No rashes or lesions Lymph nodes: Cervical, supraclavicular, and axillary nodes normal.  Lab Results  Component Value Date   HGBA1C 8.8 (H) 03/23/2018   HGBA1C 7.6 12/21/2017   HGBA1C 8.3 08/10/2017    Lab Results  Component Value Date   CREATININE 1.05 03/23/2018   CREATININE 0.90 02/09/2017   CREATININE 0.86 12/17/2016    Lab Results  Component Value Date   WBC 14.5 (H) 08/08/2016   HGB 13.6 08/08/2016   HCT 41.0 08/08/2016   PLT 330 08/08/2016    GLUCOSE 153 (H) 03/23/2018   CHOL 136 03/23/2018   TRIG 188.0 (H) 03/23/2018   HDL 42.50 03/23/2018   LDLDIRECT 155.0 04/21/2016   LDLCALC 56 03/23/2018   ALT 21 03/23/2018   AST 27 03/23/2018   NA 140 03/23/2018   K 3.9 03/23/2018   CL 100 03/23/2018   CREATININE 1.05 03/23/2018   BUN 16 03/23/2018   CO2 32 03/23/2018   TSH 0.628 01/04/2012   INR 0.9 07/22/2012   HGBA1C 8.8 (H) 03/23/2018   MICROALBUR 4.5 (H) 03/23/2018    Ct Chest Wo Contrast  Result Date: 05/13/2017 CLINICAL DATA:  Shortness of breath, on oxygen therapy. EXAM: CT CHEST WITHOUT CONTRAST TECHNIQUE: Multidetector CT imaging of the chest was performed following the standard protocol without IV contrast. COMPARISON:  08/11/2016 and 01/10/2015. FINDINGS: Cardiovascular: Coronary artery calcification. Heart size normal. No pericardial effusion. Mediastinum/Nodes: Mediastinal lymph nodes are not enlarged by CT size criteria. Hilar regions are difficult to evaluate without IV contrast. No axillary adenopathy. Esophagus is grossly unremarkable. Lungs/Pleura: Mild centrilobular emphysema. There is somewhat upper lobe predominant peribronchovascular ground-glass and nodularity, new. Mild scattered associated bronchiectasis/bronchiolectasis. Additional scattered pulmonary nodules, measure up to 7 mm, stable from 08/11/2016 and likely stable from 01/10/2015. No airspace consolidation or pleural fluid. Airway is unremarkable. Upper Abdomen: Visualized portions of the liver, gallbladder and right adrenal gland are unremarkable. There may be slight nodular thickening of the left adrenal gland. Visualized portions of the left kidney, spleen, pancreas, stomach and bowel are grossly unremarkable. No upper abdominal adenopathy. Musculoskeletal: Degenerative changes in the spine. No worrisome lytic or sclerotic lesions. IMPRESSION: 1. Upper lobe predominant peribronchovascular ground-glass and nodularity, new from prior exams, with additional  scattered stable pulmonary nodules, stable from 08/11/2016 and likely stable from 01/10/2015. 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 2. Coronary artery calcification. 3.  Emphysema (ICD10-J43.9). Electronically Signed   By: Lorin Picket M.D.   On: 05/13/2017 11:09    Assessment & Plan:   Problem List Items Addressed This Visit    Vitamin D deficiency   Relevant Orders   Vitamin D (25 hydroxy) (Completed)   Hyperlipidemia   Relevant Orders   Lipid Profile (Completed)   HTN (hypertension)   Relevant Orders   Comp Met (CMET) (Completed)   Diabetes mellitus type 2, uncontrolled, with complications ([place])    Uncontrolled again due to medication lapse. Changing regimen to 70/30 bid with doses to be titrated weekly  Lab Results  Component Value Date   HGBA1C 8.8 (H) 03/23/2018         Relevant Medications   metFORMIN (GLUCOPHAGE) 500 MG tablet   insulin NPH-regular Human (NOVOLIN 70/30) (70-30) 100 UNIT/ML injection   Other Relevant Orders   HgB A1c (Completed)   Urine Microalbumin w/creat. ratio (Completed)   COPD (chronic obstructive pulmonary disease) (HCC) - Primary   Relevant Orders   Ambulatory referral to Pulmonology   Chronic hypoxemic respiratory failure ([place])    Multifactorial;  Severe COPD,  Untreated OSA and she now appears to have OHS based on observations today (desats to 88% on 4 L unless she is prodded to take several deep breaths).  She has become incredibly sedentary .  Long, direct discussion about prognosis,  Need for daily exercise.  She now appears to understand her need to start moving more despite her COPD and requests alternative pulmonologist  Referral to [facility].        Other Visit Diagnoses    Obesity hypoventilation syndrome (HCC)       Relevant Medications   metFORMIN (GLUCOPHAGE) 500 MG tablet   insulin NPH-regular Human (NOVOLIN 70/30) (70-30) 100 UNIT/ML injection   Other Relevant Orders   Ambulatory referral to Pulmonology     A total of 25 minutes of face to face time was spent with patient more than half of which was spent in counselling about the above mentioned conditions  and coordination of care   I am having Catherine [name] start on insulin NPH-regular Human and Insulin Syringe-Needle U-100. I am also having her maintain her aspirin, docusate sodium, ACCU-CHEK SMARTVIEW, Potassium, ALPRAZolam, Insulin Pen Needle, azithromycin, BREO ELLIPTA, ipratropium-albuterol, PARoxetine, promethazine, glipiZIDE, rosuvastatin, BASAGLAR KWIKPEN, Vitamin D (Ergocalciferol), albuterol, glipiZIDE, pantoprazole, metFORMIN, and butalbital-acetaminophen-caffeine.  Meds ordered this encounter  Medications  .  insulin NPH-regular Human (NOVOLIN 70/30) (70-30) 100 UNIT/ML injection    Sig: 1t units SQ before breakfast and 20 units before evening meal.  Adjust weekly per MD>    Dispense:  10 mL    Refill:  11  . Insulin Syringe-Needle U-100 (RELION INSULIN SYRINGE) 31G X 15/64" 0.3 ML MISC    Sig: Use 2 syringes daily with isulin    Dispense:  90 each    Refill:  2  . butalbital-acetaminophen-caffeine (FIORICET, ESGIC) 50-325-40 MG tablet    Sig: TAKE 1 TABLET BY MOUTH EVERY 4 HOURS AS NEEDED FOR HEADACHE    Dispense:  30 tablet    Refill:  3    Medications Discontinued During This Encounter  Medication Reason  . butalbital-acetaminophen-caffeine (FIORICET, ESGIC) 50-325-40 MG tablet Reorder    Follow-up: Return in about 3 months (around 06/23/2018) for follow up diabetes.   Crecencio Mc, MD

## 2018-03-24 LAB — LIPID PANEL
CHOLESTEROL: 136 mg/dL (ref 0–200)
HDL: 42.5 mg/dL (ref >39.00)
LDL Cholesterol: 56 mg/dL (ref 0–99)
NonHDL: 93.9
Total CHOL/HDL Ratio: 3
Triglycerides: 188 mg/dL — ABNORMAL HIGH (ref 0.0–149.0)
VLDL: 37.6 mg/dL (ref 0.0–40.0)

## 2018-03-24 LAB — MICROALBUMIN / CREATININE URINE RATIO
CREATININE, U: 53.6 mg/dL
MICROALB UR: 4.5 mg/dL — AB (ref 0.0–1.9)
MICROALB/CREAT RATIO: 8.5 mg/g (ref 0.0–30.0)

## 2018-03-24 LAB — COMPREHENSIVE METABOLIC PANEL
ALT: 21 U/L (ref 0–35)
AST: 27 U/L (ref 0–37)
Albumin: 4.1 g/dL (ref 3.5–5.2)
Alkaline Phosphatase: 115 U/L (ref 39–117)
BUN: 16 mg/dL (ref 6–23)
CALCIUM: 9.2 mg/dL (ref 8.4–10.5)
CHLORIDE: 100 meq/L (ref 96–112)
CO2: 32 meq/L (ref 19–32)
CREATININE: 1.05 mg/dL (ref 0.40–1.20)
GFR: 56.14 mL/min — AB (ref >60.00)
Glucose, Bld: 153 mg/dL — ABNORMAL HIGH (ref 70–99)
POTASSIUM: 3.9 meq/L (ref 3.5–5.1)
Sodium: 140 mEq/L (ref 135–145)
Total Bilirubin: 0.4 mg/dL (ref 0.2–1.2)
Total Protein: 7.2 g/dL (ref 6.0–8.3)

## 2018-03-24 LAB — VITAMIN D 25 HYDROXY (VIT D DEFICIENCY, FRACTURES): VITD: 31.92 ng/mL (ref 30.00–100.00)

## 2018-03-24 LAB — HEMOGLOBIN A1C: HEMOGLOBIN A1C: 8.8 % — AB (ref 4.6–6.5)

## 2018-03-25 ENCOUNTER — Telehealth: Payer: Self-pay

## 2018-03-25 ENCOUNTER — Encounter: Payer: Self-pay | Admitting: Internal Medicine

## 2018-03-25 DIAGNOSIS — E1129 Type 2 diabetes mellitus with other diabetic kidney complication: Secondary | ICD-10-CM | POA: Insufficient documentation

## 2018-03-25 DIAGNOSIS — R809 Proteinuria, unspecified: Secondary | ICD-10-CM

## 2018-03-25 NOTE — Telephone Encounter (Signed)
Patient has requested to switch providers, from Dr. Nicholos Johnsamachandran to Dr. Sung AmabileSimonds. Per policy both providers must agree with the change, is it okay to switch? Please advise

## 2018-03-25 NOTE — Assessment & Plan Note (Addendum)
Uncontrolled again due to medication lapse. Changing regimen to 70/30 bid with doses to be titrated weekly  Lab Results  Component Value Date   HGBA1C 8.8 (H) 03/23/2018

## 2018-03-25 NOTE — Assessment & Plan Note (Signed)
Multifactorial;  Severe COPD,  Untreated OSA and she now appears to have OHS based on observations today (desats to 88% on 4 L unless she is prodded to take several deep breaths).  She has become incredibly sedentary .  Long, direct discussion about prognosis,  Need for daily exercise.  She now appears to understand her need to start moving more despite her COPD and requests alternative pulmonologist  [facility]

## 2018-03-29 NOTE — Telephone Encounter (Signed)
ok 

## 2018-03-29 NOTE — Telephone Encounter (Signed)
OK with me  Dave 

## 2018-03-30 ENCOUNTER — Telehealth: Payer: Self-pay | Admitting: Internal Medicine

## 2018-03-30 NOTE — Telephone Encounter (Signed)
Copied from CRM [id number]. Topic: Quick Communication - See Telephone Encounter >> Mar 30, 2018  9:19 AM Tamela OddiMartin, Don'Quashia, NT wrote: CRM for notification. See Telephone encounter for: 03/30/18. Nikki calling from [facility] states that the provider sent a E-scribe on the 03/23/18 for insulin NPH-regular Human (NOVOLIN 70/30) (70-30) 100 UNIT/ML injection. She just need clarification on the dosage. Please call with that clarification [phone]

## 2018-03-31 NOTE — Telephone Encounter (Signed)
Called Nikki at [facility] and she states that on the refill sent for patients Novolin 70/30 the sig say 1t she wanted clarification on the directions.

## 2018-03-31 NOTE — Telephone Encounter (Signed)
Pharmacy is calling for clarification of dosing on insulin- please let her know.

## 2018-04-01 NOTE — Telephone Encounter (Signed)
Spoke with Vernona RiegerLaura at [facility] and gave her the clarification on the directions per Dr. Melina Schoolsullo's office note on 03/23/2018.

## 2018-04-02 DIAGNOSIS — J449 Chronic obstructive pulmonary disease, unspecified: Secondary | ICD-10-CM | POA: Diagnosis not present

## 2018-04-05 ENCOUNTER — Other Ambulatory Visit: Payer: Self-pay | Admitting: Internal Medicine

## 2018-05-03 ENCOUNTER — Telehealth: Payer: Self-pay | Admitting: *Deleted

## 2018-05-03 ENCOUNTER — Telehealth: Payer: Self-pay | Admitting: Internal Medicine

## 2018-05-03 DIAGNOSIS — J449 Chronic obstructive pulmonary disease, unspecified: Secondary | ICD-10-CM | POA: Diagnosis not present

## 2018-05-03 NOTE — Telephone Encounter (Signed)
Copied from CRM #[id number]. Topic: Quick Communication - See Telephone Encounter >> Apr 30, 2018 10:21 AM Jens SomMedley, Jennifer A wrote: CRM for notification. See Telephone encounter for: 04/30/18. Patient called requesting a medical note to excuse from jury duty.  Patient can have her son pick it up or it can be email to her at [email or web address]  Please advise with patient when completed [phone]

## 2018-05-03 NOTE — Telephone Encounter (Signed)
I do not write notes to excuse patients from jury duty  Unless they cannot sit in a chair  Or have dementia

## 2018-05-03 NOTE — Telephone Encounter (Signed)
Correction:  I WILL write a letter excusin Catherine [name] from jury Duty based on her physical health.  Letter written and in chart

## 2018-05-04 NOTE — Telephone Encounter (Signed)
Letter printed and given to [place].

## 2018-05-04 NOTE — Telephone Encounter (Signed)
See other phone note

## 2018-05-05 NOTE — Telephone Encounter (Signed)
Spoke with pt to let her know that the letter is ready for pick up. Pt stated that she would have her son come by tomorrow to pick it up. Letter has been placed up front.

## 2018-05-06 ENCOUNTER — Ambulatory Visit: Payer: Medicare HMO | Admitting: Pulmonary Disease

## 2018-05-06 ENCOUNTER — Other Ambulatory Visit: Payer: Self-pay | Admitting: Pulmonary Disease

## 2018-05-06 ENCOUNTER — Encounter: Payer: Self-pay | Admitting: Pulmonary Disease

## 2018-05-06 VITALS — BP 150/80 | HR 107 | Ht 62.25 in | Wt 275.0 lb

## 2018-05-06 DIAGNOSIS — R918 Other nonspecific abnormal finding of lung field: Secondary | ICD-10-CM

## 2018-05-06 DIAGNOSIS — J9611 Chronic respiratory failure with hypoxia: Secondary | ICD-10-CM

## 2018-05-06 DIAGNOSIS — R69 Illness, unspecified: Secondary | ICD-10-CM | POA: Diagnosis not present

## 2018-05-06 DIAGNOSIS — J849 Interstitial pulmonary disease, unspecified: Secondary | ICD-10-CM | POA: Diagnosis not present

## 2018-05-06 DIAGNOSIS — J449 Chronic obstructive pulmonary disease, unspecified: Secondary | ICD-10-CM

## 2018-05-06 MED ORDER — TIOTROPIUM BROMIDE MONOHYDRATE 2.5 MCG/ACT IN AERS: 2.0000 | INHALATION_SPRAY | Freq: Every day | RESPIRATORY_TRACT | 10 refills | Status: DC

## 2018-05-06 MED ORDER — ACLIDINIUM BROMIDE 400 MCG/ACT IN AEPB: 1.0000 | INHALATION_SPRAY | Freq: Two times a day (BID) | RESPIRATORY_TRACT | 1 refills | Status: DC

## 2018-05-06 MED ORDER — ARFORMOTEROL TARTRATE 15 MCG/2ML IN NEBU
15.0000 ug | INHALATION_SOLUTION | Freq: Two times a day (BID) | RESPIRATORY_TRACT | 10 refills | Status: DC
Start: 2018-05-06 — End: 2018-05-07

## 2018-05-06 MED ORDER — BUDESONIDE 0.5 MG/2ML IN SUSP: 0.5000 mg | Freq: Two times a day (BID) | RESPIRATORY_TRACT | 10 refills | Status: DC

## 2018-05-06 NOTE — Patient Instructions (Addendum)
New medications: 1) budesonide (Pulmicort) 0.5 mg nebulized twice a day 2) arformoterol (Brovana) 1 vial nebulized twice a day 3) Spiriva Respimat 2.5 strength, 2 actuations inhaled daily  Continue albuterol as needed for increased wheezing, cough, shortness of breath, chest tightness  Continue oxygen therapy is close to 24 hours/day as possible  Repeat CT scan of the chest ordered to evaluate pulmonary nodules  We discussed the importance of weight loss  Follow-up in 6 weeks.  Call sooner if needed

## 2018-05-07 ENCOUNTER — Telehealth: Payer: Self-pay | Admitting: Pulmonary Disease

## 2018-05-07 DIAGNOSIS — J449 Chronic obstructive pulmonary disease, unspecified: Secondary | ICD-10-CM

## 2018-05-07 MED ORDER — BUDESONIDE 0.5 MG/2ML IN SUSP: 0.5000 mg | Freq: Two times a day (BID) | RESPIRATORY_TRACT | 10 refills | Status: DC

## 2018-05-07 MED ORDER — ARFORMOTEROL TARTRATE 15 MCG/2ML IN NEBU: 15.0000 ug | INHALATION_SOLUTION | Freq: Two times a day (BID) | RESPIRATORY_TRACT | 10 refills | Status: DC

## 2018-05-07 NOTE — Telephone Encounter (Signed)
RXs for nebulizers printed and [facility] per pt's request.

## 2018-05-07 NOTE — Telephone Encounter (Signed)
Pt c/o medication issue:  1. Name of Medication: medication we gave her yesterday for her Nebulizer (she's not sure the name)   2. How are you currently taking this medication (dosage and times per day)?has not taken it   3. Are you having a reaction (difficulty breathing--STAT)? No   4. What is your medication issue? Can't afford it, would like us to send it through [facility].

## 2018-05-09 ENCOUNTER — Other Ambulatory Visit: Payer: Self-pay | Admitting: Internal Medicine

## 2018-05-09 DIAGNOSIS — R69 Illness, unspecified: Secondary | ICD-10-CM | POA: Diagnosis not present

## 2018-05-10 NOTE — Progress Notes (Signed)
PULMONARY OFFICE FOLLOW UP NOTE  Requesting MD/Service: Darrick Huntsman Date of initial consultation: 05/06/18 Reason for consultation: COPD  PT PROFILE: 62 y.o. female former smoker (1 PPD x 40 yrs, quit 2013) previously seen by Dr Meredeth Ide and Dr Nicholos Johns with severe COPD, chronic hypoxemic respiratory failure, OSA/OHS (intolerant to CPAP)  DATA: 02/24/12 spirometry: Very severe obstruction (FEV1 0.83 L, 37% pred) 03/03/13 PSG: AHI 12.5 05/13/17 CT chest: Mild centrilobular emphysema. There is somewhat upper lobe predominant peribronchovascular ground-glass and nodularity, new. Mild scattered associated bronchiectasis/bronchiolectasis. Additional scattered pulmonary nodules, measure up to 7 mm, stable from 08/11/2016   INTERVAL:  SUBJ:  Former pt of Dr Nicholos Johns, last seen 08/05/17. She requested change of pulmonologist. She has dx of severe COPD, CHRF. She reports little change overall since last seen in this office. She has class IV dyspnea and is unable to perform most ADLs. There is little DTD variation and no seasonal component. She reports frequent cough paroxysms with scant thick mucus. She has been on supplemental O2 since 2012. She has no significant occupational exposures. Denies CP, fever, purulent sputum, hemoptysis, LE edema and calf tenderness.  She has been out of Breo X 1 month. She uses albuterol 4-5 times per day   Vitals:   05/06/18 1427 05/06/18 1432  BP:  (!) 150/80  Pulse:  (!) 107  SpO2:  90%  Weight: 275 lb (124.7 kg)   Height: 5' 2.25" (1.581 m)   4 LPM pulse   EXAM:  Gen: Obese, in WC, no overt respiratory distress HEENT: NCAT, sclera white, oropharynx normal Neck: Supple without LAN, thyromegaly. JVP not visualized Lungs: diminished throughout, few scattered wheezes Cardiovascular: RRR, no murmurs noted Abdomen: Soft, nontender, normal BS Ext: without clubbing, cyanosis, edema Neuro: CNs grossly intact, motor and sensory intact Skin: Limited exam, no  lesions noted  DATA:   BMP Latest Ref Rng & Units 03/23/2018 02/09/2017 12/17/2016  Glucose 70 - 99 mg/dL 409(W) - 119(J)  BUN 6 - 23 mg/dL 16 - 10  Creatinine 4.78 - 1.20 mg/dL 2.95 6.21 3.08  Sodium 135 - 145 mEq/L 140 - 141  Potassium 3.5 - 5.1 mEq/L 3.9 - 3.7  Chloride 96 - 112 mEq/L 100 - 104  CO2 19 - 32 mEq/L 32 - 32  Calcium 8.4 - 10.5 mg/dL 9.2 - 9.2    CBC Latest Ref Rng & Units 08/08/2016 04/21/2016 01/02/2016  WBC 3.8 - 10.8 K/uL 14.5(H) 12.6(H) 10.9(H)  Hemoglobin 11.7 - 15.5 g/dL 65.7 84.6 96.2  Hematocrit 35.0 - 45.0 % 41.0 37.5 40.3  Platelets 140 - 400 K/uL 330 280 334.0    CXR:    I have personally reviewed all chest radiographs reported above including CXRs and CT chest unless otherwise indicated  IMPRESSION:     ICD-10-CM   1. COPD, very severe (HCC) J44.9   2. Chronic hypoxemic respiratory failure (HCC) J96.11   3. Morbid obesity due to excess calories (HCC) E66.01   4. Multiple pulmonary nodules R91.8   5. Interstitial pulmonary disease (HCC) J84.9 CT CHEST WO CONTRAST   Suspect OHS is a significant contributor to respiratory impairment. Last CT chest also showed patchy bilateral upper lobe predominant interstitial infiltrates. The pulmonary nodules are likely benign  Her COPD is very advanced. She is to the point where she would benefit from change to nebulized steroids and LABA  PLAN:  New medications: 1) budesonide (Pulmicort) 0.5 mg nebulized twice a day 2) arformoterol (Brovana) 1 vial nebulized twice a day 3) Spiriva  Respimat 2.5 strength, 2 actuations inhaled daily  Continue albuterol as needed for increased wheezing, cough, shortness of breath, chest tightness  Continue oxygen therapy is close to 24 hours/day as possible  Repeat CT scan of the chest ordered to evaluate pulmonary nodules and pulmonary infiltrates  We discussed the importance of weight loss and strategies with a recommendation of reducing all processed sugars and simple  carbohydrates  Follow-up in 6 weeks.  Call sooner if needed   Billy Fischer, MD PCCM service Mobile [phone] Pager [phone] 05/10/2018 10:06 PM

## 2018-05-15 ENCOUNTER — Other Ambulatory Visit: Payer: Self-pay | Admitting: Internal Medicine

## 2018-06-03 ENCOUNTER — Other Ambulatory Visit: Payer: Self-pay | Admitting: Internal Medicine

## 2018-06-03 ENCOUNTER — Ambulatory Visit
Admission: RE | Admit: 2018-06-03 | Discharge: 2018-06-03 | Disposition: A | Discharge status: Home / Self Care | Payer: Medicare HMO | Source: Ambulatory Visit | Attending: Pulmonary Disease | Admitting: Pulmonary Disease

## 2018-06-03 DIAGNOSIS — J432 Centrilobular emphysema: Secondary | ICD-10-CM | POA: Diagnosis not present

## 2018-06-03 DIAGNOSIS — J849 Interstitial pulmonary disease, unspecified: Secondary | ICD-10-CM | POA: Diagnosis not present

## 2018-06-03 DIAGNOSIS — I2510 Atherosclerotic heart disease of native coronary artery without angina pectoris: Secondary | ICD-10-CM | POA: Diagnosis not present

## 2018-06-03 DIAGNOSIS — J449 Chronic obstructive pulmonary disease, unspecified: Secondary | ICD-10-CM | POA: Diagnosis not present

## 2018-06-03 DIAGNOSIS — I700 Atherosclerosis of aorta: Secondary | ICD-10-CM | POA: Insufficient documentation

## 2018-06-03 DIAGNOSIS — R918 Other nonspecific abnormal finding of lung field: Secondary | ICD-10-CM | POA: Diagnosis not present

## 2018-06-03 DIAGNOSIS — R0602 Shortness of breath: Secondary | ICD-10-CM | POA: Diagnosis not present

## 2018-06-09 DIAGNOSIS — R69 Illness, unspecified: Secondary | ICD-10-CM | POA: Diagnosis not present

## 2018-06-10 ENCOUNTER — Ambulatory Visit: Payer: Medicare HMO | Admitting: Pulmonary Disease

## 2018-06-10 ENCOUNTER — Encounter: Payer: Self-pay | Admitting: Pulmonary Disease

## 2018-06-10 VITALS — BP 128/80 | HR 91 | Resp 16 | Ht 62.4 in | Wt 276.0 lb

## 2018-06-10 DIAGNOSIS — J9611 Chronic respiratory failure with hypoxia: Secondary | ICD-10-CM

## 2018-06-10 DIAGNOSIS — G4733 Obstructive sleep apnea (adult) (pediatric): Secondary | ICD-10-CM | POA: Diagnosis not present

## 2018-06-10 DIAGNOSIS — J449 Chronic obstructive pulmonary disease, unspecified: Secondary | ICD-10-CM | POA: Diagnosis not present

## 2018-06-10 DIAGNOSIS — R0609 Other forms of dyspnea: Secondary | ICD-10-CM | POA: Diagnosis not present

## 2018-06-10 MED ORDER — TIOTROPIUM BROMIDE MONOHYDRATE 2.5 MCG/ACT IN AERS: 1.0000 | INHALATION_SPRAY | Freq: Every day | RESPIRATORY_TRACT | 10 refills | Status: DC

## 2018-06-10 MED ORDER — TIOTROPIUM BROMIDE MONOHYDRATE 2.5 MCG/ACT IN AERS: 1.0000 | INHALATION_SPRAY | Freq: Every day | RESPIRATORY_TRACT | 0 refills | Status: DC

## 2018-06-10 MED ORDER — FLUTICASONE-SALMETEROL 250-50 MCG/DOSE IN AEPB: 1.0000 | INHALATION_SPRAY | Freq: Two times a day (BID) | RESPIRATORY_TRACT | 5 refills | Status: DC

## 2018-06-10 NOTE — Progress Notes (Signed)
PULMONARY OFFICE FOLLOW UP NOTE  Requesting MD/Service: Darrick Huntsman Date of initial consultation: 05/06/18 Reason for consultation: COPD  PT PROFILE: 62 y.o. female former smoker (1 PPD x 40 yrs, quit 2013) previously seen by Dr Meredeth Ide and Dr Nicholos Johns with severe COPD, chronic hypoxemic respiratory failure, OSA/OHS (intolerant to CPAP)   DATA: 02/24/12 spirometry: Very severe obstruction (FEV1 0.83 L, 37% pred) 03/03/13 PSG: AHI 12.5 09/18/16 PFTs: FVC: 1.75 > 1.84 L (60 > 63 %pred), FEV1: 0.65 > 0.72 L (30 > 34 %pred), FEV1/FVC: 37%, TLC: 3.67 L (78 %pred), DLCO 41 %pred, DLCO/VA 117%.  Flow volume curve consistent with severe obstruction  05/13/17 CT chest: Mild centrilobular emphysema. There is somewhat upper lobe predominant peribronchovascular ground-glass and nodularity, new. Mild scattered associated bronchiectasis/bronchiolectasis. Additional scattered pulmonary nodules, measure up to 7 mm, stable from 08/11/2016 06/03/18 HRCT chest: The previously seen groundglass opacities have resolved.  There are multiple pulmonary nodules, unchanged since 2017 and therefore deemed benign.  There is really no underlying pulmonary fibrosis to speak of.  Mild centrilobular emphysema noted.   INTERVAL: Last seen 05/06/2018.  At that time, I prescribed budesonide and arformoterol in nebulized form.  However, these medications were too expensive for her to purchase.  I provided a sample of Spiriva Respimat inhaler which she believes was beneficial.  However, she ran out of the sample and there was no prescription entered.  She was previously prescribed Breo inhaler but she does not have this medication, also too expensive.  SUBJ:  This is a scheduled follow-up.  There have been no major changes.  Because of cost prohibition, she is only on nebulized DuoNeb which she is using 4-5 times per day.  She remains on oxygen therapy which she wears consistently.  She continues to have very severe exertional dyspnea  with little day-to-day or moment to moment variation.  She denies significant cough and sputum production.  She denies chest pain, hemoptysis, fever.  She has chronic lower extremity edema, unchanged from her baseline.   Vitals:   06/10/18 1131 06/10/18 1139  BP:  128/80  Pulse:  91  Resp: 16   SpO2:  95%  Weight: 276 lb (125.2 kg)   Height: 5' 2.4" (1.585 m)   4 LPM pulse   EXAM:  Gen: Obese, in WC, no overt distress at rest HEENT: NCAT, sclerae white Neck: JVP not visualized Lungs: Severely diminished BS with no wheezes or other adventitious sounds Cardiovascular: Regular, no M Abdomen: Obese, soft, NT, NABS Ext: Brawny symmetric pretibial edema Neuro: No focal deficit Skin: Limited exam, no lesions noted  DATA:   BMP Latest Ref Rng & Units 03/23/2018 02/09/2017 12/17/2016  Glucose 70 - 99 mg/dL 161(W) - 960(A)  BUN 6 - 23 mg/dL 16 - 10  Creatinine 5.40 - 1.20 mg/dL 9.81 1.91 4.78  Sodium 135 - 145 mEq/L 140 - 141  Potassium 3.5 - 5.1 mEq/L 3.9 - 3.7  Chloride 96 - 112 mEq/L 100 - 104  CO2 19 - 32 mEq/L 32 - 32  Calcium 8.4 - 10.5 mg/dL 9.2 - 9.2    CBC Latest Ref Rng & Units 08/08/2016 04/21/2016 01/02/2016  WBC 3.8 - 10.8 K/uL 14.5(H) 12.6(H) 10.9(H)  Hemoglobin 11.7 - 15.5 g/dL 29.5 62.1 30.8  Hematocrit 35.0 - 45.0 % 41.0 37.5 40.3  Platelets 140 - 400 K/uL 330 280 334.0    CXR: No new film  I have personally reviewed all chest radiographs reported above including CXRs and CT chest unless otherwise  indicated  IMPRESSION:     ICD-10-CM   1. Chronic hypoxemic respiratory failure (HCC) J96.11   2. Very severe limiting DOE due to COPD and obesity R06.09   3. COPD, very severe (HCC) J44.9   4. Severe obesity E66.01   5. Obstructive sleep apnea.  Intolerant to CPAP G47.33    Limited income makes it difficult to find an affordable bronchodilator regimen for her  PLAN:  Continue Spiriva Respimat (2.5 mg) inhaler daily.  Sample provided.  Prescription  entered.  Begin Wixella (generic equivalent Advair) 250/50 DPI, 1 inhalation twice a day.  She is instructed to rinse her mouth after use.  Continue DuoNeb as needed  Continue oxygen therapy is close to 24 hours/day as possible  Follow-up in 6 to 8 weeks.  Call sooner if needed   Billy Fischer, MD PCCM service Mobile [phone] Pager [phone] 06/10/2018 3:20 PM

## 2018-06-10 NOTE — Patient Instructions (Signed)
Continue Spiriva Respimat (2.5 mg) inhaler daily.  Prescription entered  Begin Wixella (generic equivalent to Advair) 250/50 inhaler.  1 inhalation twice a day.  Rinse mouth after use  Continue DuoNeb as needed  Continue oxygen therapy as currently prescribed  Follow-up in 6 to 8 weeks.  Call sooner if needed

## 2018-06-15 ENCOUNTER — Other Ambulatory Visit: Payer: Self-pay | Admitting: Internal Medicine

## 2018-06-17 NOTE — Telephone Encounter (Signed)
Promethazine    Refilled: 04/05/2018 Fioricet    Refilled: 03/23/2018  Last OV: 03/23/2018 Next OV: 02/18/2019

## 2018-07-01 ENCOUNTER — Other Ambulatory Visit: Payer: Self-pay | Admitting: Internal Medicine

## 2018-07-03 DIAGNOSIS — J449 Chronic obstructive pulmonary disease, unspecified: Secondary | ICD-10-CM | POA: Diagnosis not present

## 2018-07-06 DIAGNOSIS — R69 Illness, unspecified: Secondary | ICD-10-CM | POA: Diagnosis not present

## 2018-07-07 NOTE — Telephone Encounter (Signed)
Okay to refill, or should pt be on OTC Vit D now? 

## 2018-07-13 ENCOUNTER — Other Ambulatory Visit: Payer: Self-pay | Admitting: Internal Medicine

## 2018-07-15 ENCOUNTER — Other Ambulatory Visit: Payer: Self-pay | Admitting: Internal Medicine

## 2018-07-29 ENCOUNTER — Other Ambulatory Visit: Payer: Self-pay | Admitting: Internal Medicine

## 2018-08-02 ENCOUNTER — Ambulatory Visit: Payer: Medicare HMO | Admitting: Pulmonary Disease

## 2018-08-03 DIAGNOSIS — J449 Chronic obstructive pulmonary disease, unspecified: Secondary | ICD-10-CM | POA: Diagnosis not present

## 2018-08-04 ENCOUNTER — Other Ambulatory Visit: Payer: Self-pay | Admitting: Internal Medicine

## 2018-08-05 DIAGNOSIS — R69 Illness, unspecified: Secondary | ICD-10-CM | POA: Diagnosis not present

## 2018-08-08 DIAGNOSIS — R69 Illness, unspecified: Secondary | ICD-10-CM | POA: Diagnosis not present

## 2018-08-11 ENCOUNTER — Ambulatory Visit: Payer: Medicare HMO | Admitting: Pulmonary Disease

## 2018-08-13 ENCOUNTER — Other Ambulatory Visit: Payer: Self-pay | Admitting: Internal Medicine

## 2018-08-18 DIAGNOSIS — R69 Illness, unspecified: Secondary | ICD-10-CM | POA: Diagnosis not present

## 2018-08-19 ENCOUNTER — Ambulatory Visit: Payer: Medicare HMO | Admitting: Pulmonary Disease

## 2018-09-02 DIAGNOSIS — J449 Chronic obstructive pulmonary disease, unspecified: Secondary | ICD-10-CM | POA: Diagnosis not present

## 2018-09-05 ENCOUNTER — Other Ambulatory Visit: Payer: Self-pay | Admitting: Internal Medicine

## 2018-09-06 ENCOUNTER — Other Ambulatory Visit: Payer: Self-pay | Admitting: Internal Medicine

## 2018-09-06 NOTE — Telephone Encounter (Signed)
Please advise ok to fill ABX?

## 2018-09-07 ENCOUNTER — Other Ambulatory Visit: Payer: Self-pay | Admitting: Internal Medicine

## 2018-09-10 DIAGNOSIS — G839 Paralytic syndrome, unspecified: Secondary | ICD-10-CM | POA: Diagnosis not present

## 2018-09-15 ENCOUNTER — Ambulatory Visit: Payer: Medicare HMO | Admitting: Pulmonary Disease

## 2018-09-16 ENCOUNTER — Encounter: Payer: Self-pay | Admitting: Pulmonary Disease

## 2018-09-16 ENCOUNTER — Ambulatory Visit (INDEPENDENT_AMBULATORY_CARE_PROVIDER_SITE_OTHER): Payer: Medicare HMO | Admitting: Pulmonary Disease

## 2018-09-16 VITALS — BP 120/58 | HR 94 | Ht 63.0 in | Wt 273.0 lb

## 2018-09-16 DIAGNOSIS — J9611 Chronic respiratory failure with hypoxia: Secondary | ICD-10-CM | POA: Diagnosis not present

## 2018-09-16 DIAGNOSIS — J449 Chronic obstructive pulmonary disease, unspecified: Secondary | ICD-10-CM

## 2018-09-16 DIAGNOSIS — Z23 Encounter for immunization: Secondary | ICD-10-CM

## 2018-09-16 DIAGNOSIS — G4733 Obstructive sleep apnea (adult) (pediatric): Secondary | ICD-10-CM | POA: Diagnosis not present

## 2018-09-16 DIAGNOSIS — R69 Illness, unspecified: Secondary | ICD-10-CM | POA: Diagnosis not present

## 2018-09-16 MED ORDER — FLUTICASONE-UMECLIDIN-VILANT 100-62.5-25 MCG/INH IN AEPB: 1.0000 | INHALATION_SPRAY | Freq: Every day | RESPIRATORY_TRACT | 0 refills | Status: DC

## 2018-09-16 NOTE — Patient Instructions (Signed)
Continue oxygen therapy as close to 24 hours/day as possible  Trial of Trelegy inhaler, 1 inhalation daily.  Sample provided.   You may try it on for 1 week, off for 1 week, etc. to discern whether it provides you benefit If you feel that the Trelegy inhaler is beneficial, contact our office and we will work to obtain patient assistance for you  Continue albuterol inhaler as needed  Continue DuoNeb as needed  Follow-up in 3-4 months.  Call sooner if needed

## 2018-09-20 NOTE — Progress Notes (Signed)
PULMONARY OFFICE FOLLOW UP NOTE  Requesting MD/Service: Darrick Huntsman Date of initial consultation: 05/06/18 Reason for consultation: COPD  PT PROFILE: 63 y.o. female former smoker (1 PPD x 40 yrs, quit 2013) previously seen by Dr Meredeth Ide and Dr Nicholos Johns with severe COPD, chronic hypoxemic respiratory failure, OSA/OHS (intolerant to CPAP)   DATA: 02/24/12 spirometry: Very severe obstruction (FEV1 0.83 L, 37% pred) 03/03/13 PSG: AHI 12.5 09/18/16 PFTs: FVC: 1.75 > 1.84 L (60 > 63 %pred), FEV1: 0.65 > 0.72 L (30 > 34 %pred), FEV1/FVC: 37%, TLC: 3.67 L (78 %pred), DLCO 41 %pred, DLCO/VA 117%.  Flow volume curve consistent with severe obstruction  05/13/17 CT chest: Mild centrilobular emphysema. There is somewhat upper lobe predominant peribronchovascular ground-glass and nodularity, new. Mild scattered associated bronchiectasis/bronchiolectasis. Additional scattered pulmonary nodules, measure up to 7 mm, stable from 08/11/2016 06/03/18 HRCT chest: The previously seen groundglass opacities have resolved.  There are multiple pulmonary nodules, unchanged since 2017 and therefore deemed benign.  There is really no underlying pulmonary fibrosis to speak of.  Mild centrilobular emphysema noted.   INTERVAL: Last seen 06/10/18.  At that time, Wixela inhaler was prescribed.  She was instructed to continue Spiriva Respimat.  She was instructed to continue DuoNeb as needed.  She was instructed to wear oxygen as close to 24 hours/day as possible.  SUBJ:  This is a scheduled follow-up.  She was not able to afford the [facility] inhaler.  Likewise, she is unable to afford Spiriva inhaler.  She is wearing oxygen compliantly.  She remains profoundly disabled, more due to obesity and deconditioning than by dyspnea.  She has no new complaints.  She denies CP, fever, purulent sputum, hemoptysis and calf tenderness.  She has chronic LE edema unchanged from baseline.   Vitals:   09/16/18 1648  BP: (!) 120/58  Pulse: 94   SpO2: 95%  Weight: 273 lb (123.8 kg)  Height: 5\' 3"  (1.6 m)  4 LPM pulse   EXAM:  Gen: Very obese, in wheelchair, no overt respiratory distress HEENT: NCAT, sclerae white Neck: JVP not visualized Lungs: Breath sounds diminished without wheezes or adventitious sounds Cardiovascular: RRR, no M Abdomen: Soft, NT, NABS Ext: Symmetric brawny pretibial edema Neuro: No focal deficits on limited exam Skin: Limited exam, no lesions noted  DATA:   BMP Latest Ref Rng & Units 03/23/2018 02/09/2017 12/17/2016  Glucose 70 - 99 mg/dL 300(P) - 233(A)  BUN 6 - 23 mg/dL 16 - 10  Creatinine 0.76 - 1.20 mg/dL 2.26 3.33 5.45  Sodium 135 - 145 mEq/L 140 - 141  Potassium 3.5 - 5.1 mEq/L 3.9 - 3.7  Chloride 96 - 112 mEq/L 100 - 104  CO2 19 - 32 mEq/L 32 - 32  Calcium 8.4 - 10.5 mg/dL 9.2 - 9.2    CBC Latest Ref Rng & Units 08/08/2016 04/21/2016 01/02/2016  WBC 3.8 - 10.8 K/uL 14.5(H) 12.6(H) 10.9(H)  Hemoglobin 11.7 - 15.5 g/dL 62.5 63.8 93.7  Hematocrit 35.0 - 45.0 % 41.0 37.5 40.3  Platelets 140 - 400 K/uL 330 280 334.0    CXR: No new film  I have personally reviewed all chest radiographs reported above including CXRs and CT chest unless otherwise indicated  IMPRESSION:     ICD-10-CM   1. Chronic hypoxemic respiratory failure (HCC) J96.11   2. COPD, very severe (HCC) J44.9   3. Morbid obesity (HCC) E66.01   4. Need for immunization against influenza Z23 Flu Vaccine QUAD 36+ mos IM  5. OSA (obstructive sleep apnea)  G47.33    Limited income continues to make it difficult to find an affordable inhaler regimen for her  PLAN:  Continue oxygen therapy as close to 24 hours/day as possible  Cont CPAP with sleep  Trial of Trelegy inhaler, 1 inhalation daily.  Sample provided.   She may try it on for 1 week, off for 1 week, etc. to discern whether it provides her benefit. If she feels that the Trelegy inhaler is beneficial, she is instructed to contact our office and we will work to obtain  patient assistance for her  Continue albuterol inhaler as needed  Continue DuoNeb as needed  Follow-up in 3-4 months.  Call sooner if needed  Billy Fischeravid Simonds, MD PCCM service Mobile [phone] Pager [phone] 09/20/2018 2:13 PM

## 2018-09-21 ENCOUNTER — Other Ambulatory Visit: Payer: Self-pay | Admitting: Internal Medicine

## 2018-09-22 ENCOUNTER — Telehealth: Payer: Self-pay | Admitting: Internal Medicine

## 2018-09-22 NOTE — Telephone Encounter (Signed)
Refilled: 03/23/2018 Last OV: 03/23/2018 Next OV: 02/18/2019

## 2018-10-03 DIAGNOSIS — J449 Chronic obstructive pulmonary disease, unspecified: Secondary | ICD-10-CM | POA: Diagnosis not present

## 2018-10-11 DIAGNOSIS — G839 Paralytic syndrome, unspecified: Secondary | ICD-10-CM | POA: Diagnosis not present

## 2018-10-15 ENCOUNTER — Telehealth: Payer: Self-pay | Admitting: Pulmonary Disease

## 2018-10-15 NOTE — Telephone Encounter (Signed)
I spoke to patient in regards to Trelegy. She stated she is doing well on it. I have sent her paperwork for [employer] patient assistance, and she is going to mail back to our office. Then we will process the prescription for Trelegy, per Dr. Sung Amabile. Nothing further needed at this time.

## 2018-10-15 NOTE — Telephone Encounter (Signed)
I sent you a note on her.Marland KitchenMarland Kitchen

## 2018-11-01 NOTE — Telephone Encounter (Signed)
Spoke to patient, she just got paperwork last week. She is going to fill it out today and mail it back to [place]. Will wait for paperwork for processing.

## 2018-11-02 DIAGNOSIS — E785 Hyperlipidemia, unspecified: Secondary | ICD-10-CM | POA: Diagnosis not present

## 2018-11-02 DIAGNOSIS — R69 Illness, unspecified: Secondary | ICD-10-CM | POA: Diagnosis not present

## 2018-11-02 DIAGNOSIS — E119 Type 2 diabetes mellitus without complications: Secondary | ICD-10-CM | POA: Diagnosis not present

## 2018-11-02 DIAGNOSIS — J9610 Chronic respiratory failure, unspecified whether with hypoxia or hypercapnia: Secondary | ICD-10-CM | POA: Diagnosis not present

## 2018-11-02 DIAGNOSIS — Z794 Long term (current) use of insulin: Secondary | ICD-10-CM | POA: Diagnosis not present

## 2018-11-02 DIAGNOSIS — K219 Gastro-esophageal reflux disease without esophagitis: Secondary | ICD-10-CM | POA: Diagnosis not present

## 2018-11-02 DIAGNOSIS — K5900 Constipation, unspecified: Secondary | ICD-10-CM | POA: Diagnosis not present

## 2018-11-02 DIAGNOSIS — J439 Emphysema, unspecified: Secondary | ICD-10-CM | POA: Diagnosis not present

## 2018-11-03 DIAGNOSIS — J449 Chronic obstructive pulmonary disease, unspecified: Secondary | ICD-10-CM | POA: Diagnosis not present

## 2018-11-04 ENCOUNTER — Other Ambulatory Visit: Payer: Self-pay | Admitting: Internal Medicine

## 2018-11-04 DIAGNOSIS — R69 Illness, unspecified: Secondary | ICD-10-CM | POA: Diagnosis not present

## 2018-11-09 DIAGNOSIS — G839 Paralytic syndrome, unspecified: Secondary | ICD-10-CM | POA: Diagnosis not present

## 2018-11-21 ENCOUNTER — Other Ambulatory Visit: Payer: Self-pay | Admitting: Internal Medicine

## 2018-11-25 ENCOUNTER — Other Ambulatory Visit: Payer: Self-pay | Admitting: Internal Medicine

## 2018-11-25 NOTE — Telephone Encounter (Signed)
Refilled: 09/07/2018 Last OV: 03/23/2018 Next OV: 02/18/2019

## 2018-11-29 ENCOUNTER — Other Ambulatory Visit: Payer: Self-pay | Admitting: Internal Medicine

## 2018-11-29 DIAGNOSIS — E118 Type 2 diabetes mellitus with unspecified complications: Principal | ICD-10-CM

## 2018-11-29 DIAGNOSIS — IMO0002 Reserved for concepts with insufficient information to code with codable children: Secondary | ICD-10-CM

## 2018-11-29 DIAGNOSIS — E1165 Type 2 diabetes mellitus with hyperglycemia: Secondary | ICD-10-CM

## 2018-11-30 NOTE — Telephone Encounter (Signed)
Refill on metformin is denied until she has  nonfasting labs.  She also needs to schedule a web ex visit if possible

## 2018-12-02 DIAGNOSIS — J449 Chronic obstructive pulmonary disease, unspecified: Secondary | ICD-10-CM | POA: Diagnosis not present

## 2018-12-07 ENCOUNTER — Ambulatory Visit (INDEPENDENT_AMBULATORY_CARE_PROVIDER_SITE_OTHER): Payer: Medicare HMO | Admitting: Internal Medicine

## 2018-12-07 DIAGNOSIS — E1165 Type 2 diabetes mellitus with hyperglycemia: Secondary | ICD-10-CM | POA: Diagnosis not present

## 2018-12-07 DIAGNOSIS — R110 Nausea: Secondary | ICD-10-CM | POA: Diagnosis not present

## 2018-12-07 DIAGNOSIS — E782 Mixed hyperlipidemia: Secondary | ICD-10-CM | POA: Diagnosis not present

## 2018-12-07 DIAGNOSIS — E1129 Type 2 diabetes mellitus with other diabetic kidney complication: Secondary | ICD-10-CM

## 2018-12-07 DIAGNOSIS — E559 Vitamin D deficiency, unspecified: Secondary | ICD-10-CM | POA: Diagnosis not present

## 2018-12-07 DIAGNOSIS — R809 Proteinuria, unspecified: Secondary | ICD-10-CM

## 2018-12-07 DIAGNOSIS — L658 Other specified nonscarring hair loss: Secondary | ICD-10-CM

## 2018-12-07 DIAGNOSIS — E118 Type 2 diabetes mellitus with unspecified complications: Secondary | ICD-10-CM

## 2018-12-07 DIAGNOSIS — IMO0002 Reserved for concepts with insufficient information to code with codable children: Secondary | ICD-10-CM

## 2018-12-07 NOTE — Assessment & Plan Note (Addendum)
ARB recommended but deferred secondary to normal blood pressure

## 2018-12-07 NOTE — Assessment & Plan Note (Signed)
She is overdue for follow up since starting mixed insulin in July,  currently taking 20 units qam and 25 units qpm , metformin and glipizide  Taking aspirin and rosvastatin  Lab Results Component Value Date  HGBA1C 8.8 (H) 03/23/2018  Lab Results  Component Value Date   MICROALBUR 4.5 (H) 03/23/2018

## 2018-12-07 NOTE — Telephone Encounter (Signed)
Pt has been scheduled for a webex visit this afternoon at 3pm. Pt stated that she does not want to come into the office for lab work any time soon. The pt stated that she is staying at home during this time.

## 2018-12-07 NOTE — Progress Notes (Signed)
Virtual Visit via Video Note  I connected with@ on 12/07/18 at  3:00 PM EDT by a video enabled telemedicine application and verified that I am speaking with the correct person using two identifiers.  Location patient: home Location provider:work or home office Persons participating in the virtual visit: patient, provider  I discussed the limitations of evaluation and management by telemedicine and the availability of in person appointments. The patient expressed understanding and agreed to proceed.   HPI:  1) severe emphysema :  Seeing [facility]  Last visit in Jan using supplemental 02 24/7 and cpap at night.  Disability  complicated by obesity and deconditioning as significant con contributors  2) Type 2 DM : uncontrolled by last assessment in July 2019. mixed insulin started bid : CURRENTLY TAKIG 20 UNITS IN THE AM AND 25 IN THE PM,  DOESN'T CHECK FASTING DUE TO CONSTANT NAUSEA AND altered sleep habits.  Has been eating oatmeal daily in the morning  Settles her stomach for a few hours.   3) nausea:  Constant   Discussed potential causes including Gastroparesis likely .  Used reglan in the past ,  Was not helpful.  Using phenergan at night and prn    ROS: See pertinent positives and negatives per HPI.  Past Medical History:  Diagnosis Date  . Anxiety   . Asthma   . COPD (chronic obstructive pulmonary disease) (HCC)   . Depression   . Diabetes mellitus   . Gastritis and duodenitis June 2011   EGD  . GERD (gastroesophageal reflux disease)   . Hyperglycemia   . Hyperlipidemia   . Hypertension   . Leukocytosis   . Obesity (BMI 30-39.9)   . S/P cardiac catheterization March 2012   normal coronaries,  due to chest pain ([facility])  . Screening for breast cancer Jul 28 2011   normal  . Screening for colon cancer June 2011   polyps found, next one due 2014 ([facility])  . Tobacco abuse, in remission    quit oct during hospitalization     Past Surgical History:  Procedure Laterality  Date  . ABDOMINAL HYSTERECTOMY    . ABDOMINAL HYSTERECTOMY    . BILATERAL OOPHORECTOMY  1995   and TAH.  noncancerous reasons  . CARDIAC CATHETERIZATION  11/22/2010   no significant disease  . HEMORRHOID SURGERY      Family History  Problem Relation Age of Onset  . Coronary artery disease Mother 69  . Heart disease Mother        CABG x5  . COPD Mother 85  . Breast cancer Mother   . Kidney disease Mother   . Heart failure Father   . Angina Father   . Diabetes Father   . Depression Father   . Stroke Father   . Dementia Father   . Asthma Brother   . Asthma Brother   . Diabetes Brother   . Hypertension Brother   . Cancer Sister        breast, lung, brain    SOCIAL HX: retired Museum/gallery curator,  History of tobacco abuse    Current Outpatient Medications:  .  ACCU-CHEK SMARTVIEW test strip, TEST BLOOD SUGAR FOUR TIMES DAILY, Disp: 100 each, Rfl: 12 .  albuterol (VENTOLIN HFA) 108 (90 Base) MCG/ACT inhaler, USE 2 PUFFS EVERY 6 HOURS AS NEEDED FOR WHEEZING, Disp: 18 Inhaler, Rfl: 5 .  ALPRAZolam (XANAX) 0.5 MG tablet, Take 1 tablet (0.5 mg total) by mouth at bedtime as needed., Disp: 30 tablet,  Rfl: 5 .  aspirin 81 MG EC tablet, Take 81 mg by mouth daily.  , Disp: , Rfl:  .  budesonide (PULMICORT) 0.5 MG/2ML nebulizer solution, , Disp: , Rfl:  .  butalbital-acetaminophen-caffeine (FIORICET, ESGIC) 50-325-40 MG tablet, TAKE 1 TABLET BY MOUTH EVERY 4 HOURS AS NEEDED FOR HEADACHE, Disp: 30 tablet, Rfl: 0 .  docusate sodium (COLACE) 100 MG capsule, Take 200 mg by mouth daily. Reported on 09/20/2015, Disp: , Rfl:  .  glipiZIDE (GLUCOTROL) 5 MG tablet, TAKE 1 TABLET BY MOUTH BEFORE BREAKFAST AND 2 TABLETS BEFORE DINNER, Disp: 270 tablet, Rfl: 2 .  insulin NPH-regular Human (NOVOLIN 70/30) (70-30) 100 UNIT/ML injection, 1t units SQ before breakfast and 20 units before evening meal.  Adjust weekly per MD>, Disp: 10 mL, Rfl: 11 .  Insulin Pen Needle 29G X MISC, 1 each by Does not apply  route 3 (three) times daily. Dx:, Disp: 200 each, Rfl: 1 .  Insulin Syringe-Needle U-100 (RELION INSULIN SYRINGE) 31G X 15/64" 0.3 ML MISC, Use 2 syringes daily with isulin, Disp: 90 each, Rfl: 2 .  ipratropium-albuterol (DUONEB) 0.5-2.5 (3) MG/3ML SOLN, USE 1 VIAL VIA NEBULIZER EVERY 6 HOURS AS NEEDED, Disp: 1080 mL, Rfl: 2 .  metFORMIN (GLUCOPHAGE) 500 MG tablet, TAKE 1 TABLET BY MOUTH EVERY DAY WITH BREAKFAST, Disp: , Rfl: 3 .  pantoprazole (PROTONIX) 40 MG tablet, TAKE 1 TABLET (40 MG TOTAL) BY MOUTH DAILY. PLEASE CALL OFFICE TO SCHEDULE A FOLLOW-UP WITH PCP., Disp: 90 tablet, Rfl: 1 .  PARoxetine (PAXIL) 10 MG tablet, TAKE 1 TABLET(10 MG) BY MOUTH EVERY MORNING, Disp: 90 tablet, Rfl: 1 .  Potassium 99 MG TABS, Take 99 mg by mouth once. Reported on 09/20/2015, Disp: , Rfl:  .  promethazine (PHENERGAN) 12.5 MG tablet, TAKE 1 TABLET (12.5 MG TOTAL) BY MOUTH AT BEDTIME AS NEEDED FOR NAUSEA OR VOMITING., Disp: 30 tablet, Rfl: 0 .  rosuvastatin (CRESTOR) 40 MG tablet, TAKE 1 TABLET BY MOUTH EVERY DAY, Disp: 90 tablet, Rfl: 1 .  Vitamin D, Ergocalciferol, (DRISDOL) 1.25 MG (50000 UT) CAPS capsule, TAKE 1 CAPSULE BY MOUTH ONE TIME PER WEEK, Disp: 4 capsule, Rfl: 2 .  Fluticasone-Umeclidin-Vilant (TRELEGY ELLIPTA) 100-62.5-25 MCG/INH AEPB, Inhale 1 puff into the lungs daily. (Patient not taking: Reported on 12/07/2018), Disp: 1 each, Rfl: 0  EXAM:  VITALS per patient if applicable:  GENERAL: alert, oriented, appears well and in no acute distress. Wearing supplemental oxygen  HEENT: atraumatic, conjunttiva clear, no obvious abnormalities on inspection of external nose and ears  NECK: normal movements of the head and neck  LUNGS: on inspection no signs of respiratory distress, breathing rate appears normal, no obvious gross SOB, gasping or wheezing  CV: no obvious cyanosis  MS: moves all visible extremities without noticeable abnormality  PSYCH/NEURO: pleasant and cooperative, no obvious  depression or anxiety, speech and thought processing grossly intact  ASSESSMENT AND PLAN:  Discussed the following assessment and plan:  Mixed hyperlipidemia - Plan: Lipid panel, Direct LDL  Diabetes mellitus type 2, uncontrolled, with complications (HCC) - Plan: Hemoglobin A1c, Comprehensive metabolic panel  Nausea in adult  Microalbuminuria due to type 2 diabetes mellitus (HCC)  Vitamin D deficiency - Plan: VITAMIN D 25 Hydroxy (Vit-D Deficiency, Fractures)  Other specified nonscarring hair loss - Plan: TSH     I discussed the assessment and treatment plan with the patient. The patient was provided an opportunity to ask questions and all were answered. The patient agreed with the plan  and demonstrated an understanding of the instructions.   The patient was advised to call back or seek an in-person evaluation if the symptoms worsen or if the condition fails to improve as anticipated.  I provided 25 minutes of non-face-to-face time during this encounter.   Sherlene Shams, MD

## 2018-12-07 NOTE — Assessment & Plan Note (Signed)
Etiology unclear.  Gastritis vs gastroparesis. Continue PPI and phenergan  LFTS ordered

## 2018-12-09 ENCOUNTER — Other Ambulatory Visit: Payer: Self-pay | Admitting: Internal Medicine

## 2018-12-10 ENCOUNTER — Other Ambulatory Visit: Payer: Self-pay

## 2018-12-10 ENCOUNTER — Other Ambulatory Visit (INDEPENDENT_AMBULATORY_CARE_PROVIDER_SITE_OTHER): Payer: Medicare HMO

## 2018-12-10 DIAGNOSIS — E782 Mixed hyperlipidemia: Secondary | ICD-10-CM

## 2018-12-10 DIAGNOSIS — E118 Type 2 diabetes mellitus with unspecified complications: Secondary | ICD-10-CM | POA: Diagnosis not present

## 2018-12-10 DIAGNOSIS — L658 Other specified nonscarring hair loss: Secondary | ICD-10-CM

## 2018-12-10 DIAGNOSIS — E1165 Type 2 diabetes mellitus with hyperglycemia: Secondary | ICD-10-CM

## 2018-12-10 DIAGNOSIS — E559 Vitamin D deficiency, unspecified: Secondary | ICD-10-CM

## 2018-12-10 DIAGNOSIS — G839 Paralytic syndrome, unspecified: Secondary | ICD-10-CM | POA: Diagnosis not present

## 2018-12-10 DIAGNOSIS — IMO0002 Reserved for concepts with insufficient information to code with codable children: Secondary | ICD-10-CM

## 2018-12-10 NOTE — Addendum Note (Signed)
Addended by: Warden Fillers on: 12/10/2018 02:47 PM   Modules accepted: Orders

## 2018-12-12 LAB — COMPREHENSIVE METABOLIC PANEL
AG Ratio: 1.2 (calc) (ref 1.0–2.5)
ALT: 19 U/L (ref 6–29)
AST: 30 U/L (ref 10–35)
Albumin: 3.7 g/dL (ref 3.6–5.1)
Alkaline phosphatase (APISO): 116 U/L (ref 37–153)
BUN: 11 mg/dL (ref 7–25)
CO2: 30 mmol/L (ref 20–32)
Calcium: 9.1 mg/dL (ref 8.6–10.4)
Chloride: 102 mmol/L (ref 98–110)
Creat: 0.86 mg/dL (ref 0.50–0.99)
Globulin: 3 g/dL (calc) (ref 1.9–3.7)
Glucose, Bld: 117 mg/dL — ABNORMAL HIGH (ref 65–99)
Potassium: 4.3 mmol/L (ref 3.5–5.3)
Sodium: 142 mmol/L (ref 135–146)
Total Bilirubin: 0.3 mg/dL (ref 0.2–1.2)
Total Protein: 6.7 g/dL (ref 6.1–8.1)

## 2018-12-12 LAB — LDL CHOLESTEROL, DIRECT: Direct LDL: 70 mg/dL (ref <100)

## 2018-12-12 LAB — HEMOGLOBIN A1C
Hgb A1c MFr Bld: 8.2 % of total Hgb — ABNORMAL HIGH (ref <5.7)
Mean Plasma Glucose: 189 (calc)
eAG (mmol/L): 10.4 (calc)

## 2018-12-12 LAB — VITAMIN D 25 HYDROXY (VIT D DEFICIENCY, FRACTURES): Vit D, 25-Hydroxy: 37 ng/mL (ref 30–100)

## 2018-12-12 LAB — LIPID PANEL
Cholesterol: 139 mg/dL (ref <200)
HDL: 41 mg/dL — ABNORMAL LOW (ref >=50)
LDL Cholesterol (Calc): 73 mg/dL (calc)
Non-HDL Cholesterol (Calc): 98 mg/dL (calc) (ref <130)
Total CHOL/HDL Ratio: 3.4 (calc) (ref <5.0)
Triglycerides: 177 mg/dL — ABNORMAL HIGH (ref <150)

## 2018-12-12 LAB — TSH: TSH: 1.05 mIU/L (ref 0.40–4.50)

## 2018-12-16 DIAGNOSIS — R69 Illness, unspecified: Secondary | ICD-10-CM | POA: Diagnosis not present

## 2019-01-02 DIAGNOSIS — J449 Chronic obstructive pulmonary disease, unspecified: Secondary | ICD-10-CM | POA: Diagnosis not present

## 2019-01-04 DIAGNOSIS — R69 Illness, unspecified: Secondary | ICD-10-CM | POA: Diagnosis not present

## 2019-01-09 DIAGNOSIS — G839 Paralytic syndrome, unspecified: Secondary | ICD-10-CM | POA: Diagnosis not present

## 2019-02-01 DIAGNOSIS — J449 Chronic obstructive pulmonary disease, unspecified: Secondary | ICD-10-CM | POA: Diagnosis not present

## 2019-02-02 DIAGNOSIS — R69 Illness, unspecified: Secondary | ICD-10-CM | POA: Diagnosis not present

## 2019-02-07 ENCOUNTER — Other Ambulatory Visit: Payer: Self-pay | Admitting: Internal Medicine

## 2019-02-09 DIAGNOSIS — G839 Paralytic syndrome, unspecified: Secondary | ICD-10-CM | POA: Diagnosis not present

## 2019-02-18 ENCOUNTER — Ambulatory Visit (INDEPENDENT_AMBULATORY_CARE_PROVIDER_SITE_OTHER): Payer: Medicare HMO

## 2019-02-18 ENCOUNTER — Ambulatory Visit: Payer: Medicare HMO | Admitting: Internal Medicine

## 2019-02-18 ENCOUNTER — Encounter: Payer: Self-pay | Admitting: Internal Medicine

## 2019-02-18 ENCOUNTER — Ambulatory Visit (INDEPENDENT_AMBULATORY_CARE_PROVIDER_SITE_OTHER): Payer: Medicare HMO | Admitting: Internal Medicine

## 2019-02-18 ENCOUNTER — Other Ambulatory Visit: Payer: Self-pay

## 2019-02-18 DIAGNOSIS — E118 Type 2 diabetes mellitus with unspecified complications: Secondary | ICD-10-CM | POA: Diagnosis not present

## 2019-02-18 DIAGNOSIS — Z0000 Encounter for general adult medical examination without abnormal findings: Secondary | ICD-10-CM | POA: Diagnosis not present

## 2019-02-18 DIAGNOSIS — IMO0002 Reserved for concepts with insufficient information to code with codable children: Secondary | ICD-10-CM

## 2019-02-18 DIAGNOSIS — E1165 Type 2 diabetes mellitus with hyperglycemia: Secondary | ICD-10-CM | POA: Diagnosis not present

## 2019-02-18 DIAGNOSIS — I10 Essential (primary) hypertension: Secondary | ICD-10-CM

## 2019-02-18 DIAGNOSIS — Z7189 Other specified counseling: Secondary | ICD-10-CM | POA: Diagnosis not present

## 2019-02-18 NOTE — Patient Instructions (Addendum)
  Catherine [name] , Thank you for taking time to come for your Medicare Wellness Visit. I appreciate your ongoing commitment to your health goals. Please review the following plan we discussed and let me know if I can assist you in the future.   These are the goals we discussed: Goals      Patient Stated   . DIET - INCREASE WATER INTAKE (pt-stated)     Add flavor packets Stay hydrated    . Increase physical activity (pt-stated)     Chair exercises  Leg lifts Arm raises Repetitive motions       This is a list of the screening recommended for you and due dates:  Health Maintenance  Topic Date Due  .  Hepatitis C: One time screening is recommended by Center for Disease Control  (CDC) for  adults born from [birth year] through 1967.   02/25/1956  . HIV Screening  07/30/1969  . Tetanus Vaccine  12/23/2015  . Eye exam for diabetics  01/29/2017  . Complete foot exam   08/10/2018  . Mammogram  08/19/2018  . Urine Protein Check  03/24/2019  . Flu Shot  04/09/2019  . Hemoglobin A1C  06/11/2019  . Colon Cancer Screening  09/25/2020

## 2019-02-18 NOTE — Progress Notes (Signed)
Subjective:   Catherine SquiresDeborah [name] is a 63 y.o. female who presents for Medicare Annual (Subsequent) preventive examination.  Review of Systems:  No ROS.  Medicare Wellness Virtual Visit.  Visual/audio telehealth visit, UTA vital signs.   See social history for additional risk factors.   Cardiac Risk Factors include: advanced age (>4155men, 61>65 women);diabetes mellitus;hypertension     Objective:     Vitals: There were no vitals taken for this visit.  There is no height or weight on file to calculate BMI.  Advanced Directives 02/18/2019 02/16/2018 08/08/2016 07/21/2016  Does Patient Have a Medical Advance Directive? No No No No  Would patient like information on creating a medical advance directive? Yes (MAU/Ambulatory/Procedural Areas - Information given) Yes (MAU/Ambulatory/Procedural Areas - Information given) No - Patient declined No - patient declined information    Tobacco Social History   Tobacco Use  Smoking Status Former Smoker  . Packs/day: 1.00  . Years: 40.00  . Pack years: 40.00  . Types: Cigarettes  . Quit date: 06/27/2011  . Years since quitting: 7.6  Smokeless Tobacco Never Used     Counseling given: Not Answered   Clinical Intake:  Pre-visit preparation completed: Yes        Diabetes: Yes(Followed by pcp)  How often do you need to have someone help you when you read instructions, pamphlets, or other written materials from your doctor or pharmacy?: 1 - Never  Interpreter Needed?: No     Past Medical History:  Diagnosis Date  . Anxiety   . Asthma   . COPD (chronic obstructive pulmonary disease) (HCC)   . Depression   . Diabetes mellitus   . Gastritis and duodenitis June 2011   EGD  . GERD (gastroesophageal reflux disease)   . Hyperglycemia   . Hyperlipidemia   . Hypertension   . Leukocytosis   . Obesity (BMI 30-39.9)   . S/P cardiac catheterization March 2012   normal coronaries,  due to chest pain ([facility])  . Screening for breast  cancer Jul 28 2011   normal  . Screening for colon cancer June 2011   polyps found, next one due 2014 ([facility])  . Tobacco abuse, in remission    quit oct during hospitalization    Past Surgical History:  Procedure Laterality Date  . ABDOMINAL HYSTERECTOMY    . ABDOMINAL HYSTERECTOMY    . BILATERAL OOPHORECTOMY  1995   and TAH.  noncancerous reasons  . CARDIAC CATHETERIZATION  11/22/2010   no significant disease  . HEMORRHOID SURGERY     Family History  Problem Relation Age of Onset  . Coronary artery disease Mother 3250  . Heart disease Mother        CABG x5  . COPD Mother 5573  . Breast cancer Mother   . Kidney disease Mother   . Heart failure Father   . Angina Father   . Diabetes Father   . Depression Father   . Stroke Father   . Dementia Father   . Asthma Brother   . Asthma Brother   . Diabetes Brother   . Hypertension Brother   . Cancer Sister        breast, lung, brain   Social History   Socioeconomic History  . Marital status: Married    Spouse name: Not on file  . Number of children: 4  . Years of education: Not on file  . Highest education level: Not on file  Occupational History  . Occupation: Diplomatic Services operational officerecretary  Employer: [employer]  Social Needs  . Financial resource strain: Not hard at all  . Food insecurity    Worry: Never true    Inability: Never true  . Transportation needs    Medical: No    Non-medical: No  Tobacco Use  . Smoking status: Former Smoker    Packs/day: 1.00    Years: 40.00    Pack years: 40.00    Types: Cigarettes    Quit date: 06/27/2011    Years since quitting: 7.6  . Smokeless tobacco: Never Used  Substance and Sexual Activity  . Alcohol use: No  . Drug use: No  . Sexual activity: Never  Lifestyle  . Physical activity    Days per week: 0 days    Minutes per session: Not on file  . Stress: Only a little  Relationships  . Social Musicianconnections    Talks on phone: Not on file    Gets together: Not on file    Attends religious  service: Not on file    Active member of club or organization: Not on file    Attends meetings of clubs or organizations: Not on file    Relationship status: Not on file  Other Topics Concern  . Not on file  Social History Narrative  . Not on file    Outpatient Encounter Medications as of 02/18/2019  Medication Sig  . ACCU-CHEK SMARTVIEW test strip TEST BLOOD SUGAR FOUR TIMES DAILY  . albuterol (VENTOLIN HFA) 108 (90 Base) MCG/ACT inhaler USE 2 PUFFS EVERY 6 HOURS AS NEEDED FOR WHEEZING  . ALPRAZolam (XANAX) 0.5 MG tablet Take 1 tablet (0.5 mg total) by mouth at bedtime as needed.  Marland Kitchen. aspirin 81 MG EC tablet Take 81 mg by mouth daily.    . budesonide (PULMICORT) 0.5 MG/2ML nebulizer solution   . butalbital-acetaminophen-caffeine (FIORICET, ESGIC) 50-325-40 MG tablet TAKE 1 TABLET BY MOUTH EVERY 4 HOURS AS NEEDED FOR HEADACHE  . docusate sodium (COLACE) 100 MG capsule Take 200 mg by mouth daily. Reported on 09/20/2015  . Fluticasone-Umeclidin-Vilant (TRELEGY ELLIPTA) 100-62.5-25 MCG/INH AEPB Inhale 1 puff into the lungs daily.  Marland Kitchen. glipiZIDE (GLUCOTROL) 5 MG tablet TAKE 1 TABLET BY MOUTH BEFORE BREAKFAST AND 2 TABLETS BEFORE DINNER  . Insulin Pen Needle 29G X 5MM MISC 1 each by Does not apply route 3 (three) times daily. Dx:  . Insulin Syringe-Needle U-100 (RELION INSULIN SYRINGE) 31G X 15/64" 0.3 ML MISC Use 2 syringes daily with isulin  . ipratropium-albuterol (DUONEB) 0.5-2.5 (3) MG/3ML SOLN USE 1 VIAL VIA NEBULIZER EVERY 6 HOURS AS NEEDED  . metFORMIN (GLUCOPHAGE) 500 MG tablet TAKE 1 TABLET BY MOUTH EVERY DAY WITH BREAKFAST  . NOVOLIN 70/30 RELION (70-30) 100 UNIT/ML injection INJECT 15 UNIT SUBCUTANEOUSLY BEFORE BREAKFAST AND 20 UNITS BEFORE EVENING MEAL. ADJUST WEEKLY PER MD.  . pantoprazole (PROTONIX) 40 MG tablet TAKE 1 TABLET (40 MG TOTAL) BY MOUTH DAILY. PLEASE CALL OFFICE TO SCHEDULE A FOLLOW-UP WITH PCP.  Marland Kitchen. PARoxetine (PAXIL) 10 MG tablet TAKE 1 TABLET(10 MG) BY MOUTH EVERY MORNING   . Potassium 99 MG TABS Take 99 mg by mouth once. Reported on 09/20/2015  . promethazine (PHENERGAN) 12.5 MG tablet TAKE 1 TABLET (12.5 MG TOTAL) BY MOUTH AT BEDTIME AS NEEDED FOR NAUSEA OR VOMITING.  . rosuvastatin (CRESTOR) 40 MG tablet TAKE 1 TABLET BY MOUTH EVERY DAY  . Vitamin D, Ergocalciferol, (DRISDOL) 1.25 MG (50000 UT) CAPS capsule TAKE 1 CAPSULE BY MOUTH ONE TIME PER WEEK   No  facility-administered encounter medications on file as of 02/18/2019.     Activities of Daily Living In your present state of health, do you have any difficulty performing the following activities: 02/18/2019  Hearing? N  Vision? N  Difficulty concentrating or making decisions? N  Walking or climbing stairs? N  Dressing or bathing? N  Doing errands, shopping? N  Preparing Food and eating ? Y  Comment She does not cook. Self feeds. Husband does the cooking.  Using the Toilet? N  In the past six months, have you accidently leaked urine? N  Do you have problems with loss of bowel control? N  Managing your Medications? N  Managing your Finances? N  Housekeeping or managing your Housekeeping? Y  Comment Husband does the cleaning.  Some recent data might be hidden    Patient Care Team: Sherlene Shamsullo, Teresa L, MD as PCP - General (Internal Medicine)    Assessment:   This is a routine wellness examination for Catherine PoundDeborah.  I connected with patient 02/18/19 at  9:30 AM EDT by an audio enabled telemedicine application and verified that I am speaking with the correct person using two identifiers. Patient stated full name and DOB. Patient gave permission to continue with virtual visit. Patient's location was at home and Nurse's location was at [place] office.   Health Screenings  Mammogram - discussed Colonoscopy - 09/2010 Glaucoma -none Hearing -demonstrates normal hearing during visit. Hemoglobin A1C - 12/2018 (8.2) Cholesterol - 12/2018 (139) Dental- UTD Vision- visits within the last 12 months.  Social  Alcohol  intake - no        Smoking history-  former   Smokers in home? none Illicit drug use? none Exercise - none Diet - tries to eat low carb Sexually Active -not currently BMI- discussed the importance of a healthy diet, water intake and the benefits of aerobic exercise.  Educational material provided.   Safety  Patient feels safe at home- yes Patient does have smoke detectors at home- yes Patient does wear sunscreen or protective clothing when in direct sunlight -yes Patient does wear seat belt when in a moving vehicle -yes Patient drives- yes  Covid-19 precautions and sickness symptoms discussed.   Activities of Daily Living Patient denies needing assistance with: driving, feeding themselves, getting from bed to chair, getting to the toilet, bathing/showering, dressing or managing money. preparing   Husband cook and does the household chores.   Depression Screen Her father passed away several months ago.  States she is handling depression and stress ok  Taking all scheduled medications as directed and agrees to contact pcp if needed.   Medication-taking as directed and without issues.   Fall Screen Patient denies being afraid of falling or falling in the last year.   Memory Screen Patient is alert.  Patient denies difficulty focusing, concentrating or misplacing items. Correctly identified the president of the [place], season and recall. Patient likes to read, plays computer games, crossword puzzles and crochet for brain stimulation.  Immunizations The following Immunizations were discussed: Influenza, shingles, pneumonia, and tetanus.   Other Providers Patient Care Team: Sherlene Shamsullo, Teresa L, MD as PCP - General (Internal Medicine)  Exercise Activities and Dietary recommendations Current Exercise Habits: The patient does not participate in regular exercise at present  Goals      Patient Stated   . DIET - INCREASE WATER INTAKE (pt-stated)     Add flavor packets Stay hydrated     . Increase physical activity (pt-stated)     Chair  exercises  Leg lifts Arm raises Repetitive motions       Fall Risk Fall Risk  02/18/2019 02/16/2018 08/08/2016 09/20/2015  Falls in the past year? 0 No No No   Depression Screen PHQ 2/9 Scores 02/18/2019 02/16/2018 08/08/2016 09/20/2015  PHQ - 2 Score 0 0 0 0  PHQ- 9 Score - - - -     Cognitive Function MMSE - Mini Mental State Exam 02/16/2018  Orientation to time 5  Orientation to Place 5  Registration 3  Attention/ Calculation 5  Recall 3  Language- name 2 objects 2  Language- repeat 1  Language- follow 3 step command 3  Language- read & follow direction 1  Write a sentence 1  Copy design 1  Total score 30     6CIT Screen 02/18/2019 08/08/2016  What Year? 0 points 0 points  What month? 0 points 0 points  What time? 0 points 0 points  Count back from 20 0 points 0 points  Months in reverse 0 points 0 points  Repeat phrase - 0 points  Total Score - 0    Immunization History  Administered Date(s) Administered  . Hepatitis B 01/21/2012  . Influenza Whole 07/10/2011, 08/31/2012  . Influenza,inj,Quad PF,6+ Mos 06/12/2014, 10/06/2016, 09/16/2018  . Pneumococcal Conjugate-13 06/12/2014  . Pneumococcal Polysaccharide-23 12/08/2011  . Tdap 12/22/2005   Screening Tests Health Maintenance  Topic Date Due  . Hepatitis C Screening  September 25, 1955  . HIV Screening  07/30/1969  . TETANUS/TDAP  12/23/2015  . OPHTHALMOLOGY EXAM  01/29/2017  . FOOT EXAM  08/10/2018  . MAMMOGRAM  08/19/2018  . URINE MICROALBUMIN  03/24/2019  . INFLUENZA VACCINE  04/09/2019  . HEMOGLOBIN A1C  06/11/2019  . COLONOSCOPY  09/25/2020      Plan:    End of life planning; Advance aging; Advanced directives discussed.  Copy of current HCPOA/Living Will requested upon  completion.    I have personally reviewed and noted the following in the patient's chart:   . Medical and social history . Use of alcohol, tobacco or illicit drugs  . Current  medications and supplements . Functional ability and status . Nutritional status . Physical activity . Advanced directives . List of other physicians . Hospitalizations, surgeries, and ER visits in previous 12 months . Vitals . Screenings to include cognitive, depression, and falls . Referrals and appointments  In addition, I have reviewed and discussed with patient certain preventive protocols, quality metrics, and best practice recommendations. A written personalized care plan for preventive services as well as general preventive health recommendations were provided to patient.     OBrien-Blaney, Airyonna Franklyn L, LPN  1/61/0960    I have reviewed the above information and agree with above.   Catherine Medina, MD

## 2019-02-18 NOTE — Progress Notes (Signed)
Telephone Note  This visit type was conducted due to national recommendations for restrictions regarding the COVID-19 pandemic (e.g. social distancing).  This format is felt to be most appropriate for this patient at this time.  All issues noted in this document were discussed and addressed.  No physical exam was performed (except for noted visual exam findings with Video Visits).   I connected with@ on 02/18/19 at  3:30 PM EDT by telephone and verified that I am speaking with the correct person using two identifiers. Location patient: home Location provider: work or home office Persons participating in the virtual visit: patient, provider  I discussed the limitations, risks, security and privacy concerns of performing an evaluation and management service by telephone and the availability of in person appointments. I also discussed with the patient that there may be a patient responsible charge related to this service. The patient expressed understanding and agreed to proceed.  Reason for visit: Type2 DM follow up  HPI:  3 month follow up on Type 2 DM, She  feels generally well, is exercising several times per week and checking post prandial blood sugars twice  Daily.   Taking her medications as directed. Following a carbohydrate modified diet 6 days per week. Denies numbness, burning and tingling of extremities. Appetite is good.    She is eating "overnight oatmeal prepared from  steel cut oats, light Greek yogurt and almond milk for breakfast instead and  checks her sugars 2 hours after eating ally.   cbgs have been consistently under 160. Evening sugars have been occasionally higher.  Using 70/30 insulin 20 unitsn in the am and 25 units in the pm.  Advised to increase pm dose to 28 units  The patient has no signs or symptoms of COVID 19 infection (fever, cough, sore throat  or shortness of breath beyond what is typical for patient).  Patient denies contact with other persons with the above  mentioned symptoms or with anyone confirmed to have COVID 19   Patient is taking her medications as prescribed and notes no adverse effects.  Home BP readings have been done about once per week and are  generally < 130/80 .  She is avoiding added salt in her diet and walking regularly about 3 times per week for exercise  .  COPD: she remains oxygen dependent and tobacco free. She has not had any hospitalizations in over 6 months .  She has not left the house since March.  ROS: See pertinent positives and negatives per HPI.  Past Medical History:  Diagnosis Date  . Anxiety   . Asthma   . COPD (chronic obstructive pulmonary disease) (HCC)   . Depression   . Diabetes mellitus   . Gastritis and duodenitis June 2011   EGD  . GERD (gastroesophageal reflux disease)   . Hyperglycemia   . Hyperlipidemia   . Hypertension   . Leukocytosis   . Obesity (BMI 30-39.9)   . S/P cardiac catheterization March 2012   normal coronaries,  due to chest pain ([place])  . Screening for breast cancer Jul 28 2011   normal  . Screening for colon cancer June 2011   polyps found, next one due 2014 ([place])  . Tobacco abuse, in remission    quit oct during hospitalization     Past Surgical History:  Procedure Laterality Date  . ABDOMINAL HYSTERECTOMY    . ABDOMINAL HYSTERECTOMY    . BILATERAL OOPHORECTOMY  1995   and TAH.  noncancerous reasons  .  CARDIAC CATHETERIZATION  11/22/2010   no significant disease  . HEMORRHOID SURGERY      Family History  Problem Relation Age of Onset  . Coronary artery disease Mother [id number]  . Heart disease Mother        CABG x5  . COPD Mother [id number]  . Breast cancer Mother   . Kidney disease Mother   . Heart failure Father   . Angina Father   . Diabetes Father   . Depression Father   . Stroke Father   . Dementia Father   . Asthma Brother   . Asthma Brother   . Diabetes Brother   . Hypertension Brother   . Cancer Sister        breast, lung, brain    SOCIAL HX:   reports that she quit smoking about 7 years ago. Her smoking use included cigarettes. She has a 40.00 pack-year smoking history. She has never used smokeless tobacco. She reports that she does not drink alcohol or use drugs.   Current Outpatient Medications:  .  ACCU-CHEK SMARTVIEW test strip, TEST BLOOD SUGAR FOUR TIMES DAILY, Disp: 100 each, Rfl: 12 .  albuterol (VENTOLIN HFA) 108 (90 Base) MCG/ACT inhaler, USE 2 PUFFS EVERY 6 HOURS AS NEEDED FOR WHEEZING, Disp: 18 Inhaler, Rfl: 5 .  ALPRAZolam (XANAX) 0.5 MG tablet, Take 1 tablet (0.5 mg total) by mouth at bedtime as needed., Disp: 30 tablet, Rfl: 5 .  aspirin 81 MG EC tablet, Take 81 mg by mouth daily.  , Disp: , Rfl:  .  budesonide (PULMICORT) 0.5 MG/2ML nebulizer solution, , Disp: , Rfl:  .  butalbital-acetaminophen-caffeine (FIORICET, ESGIC) 50-325-40 MG tablet, TAKE 1 TABLET BY MOUTH EVERY 4 HOURS AS NEEDED FOR HEADACHE, Disp: 30 tablet, Rfl: 0 .  docusate sodium (COLACE) 100 MG capsule, Take 200 mg by mouth daily. Reported on 09/20/2015, Disp: , Rfl:  .  Fluticasone-Umeclidin-Vilant (TRELEGY ELLIPTA) 100-62.5-25 MCG/INH AEPB, Inhale 1 puff into the lungs daily., Disp: 1 each, Rfl: 0 .  glipiZIDE (GLUCOTROL) 5 MG tablet, TAKE 1 TABLET BY MOUTH BEFORE BREAKFAST AND 2 TABLETS BEFORE DINNER, Disp: 270 tablet, Rfl: 2 .  Insulin Pen Needle 29G X 5MM MISC, 1 each by Does not apply route 3 (three) times daily. Dx:, Disp: 200 each, Rfl: 1 .  Insulin Syringe-Needle U-100 (RELION INSULIN SYRINGE) 31G X 15/64" 0.3 ML MISC, Use 2 syringes daily with isulin, Disp: 90 each, Rfl: 2 .  ipratropium-albuterol (DUONEB) 0.5-2.5 (3) MG/3ML SOLN, USE 1 VIAL VIA NEBULIZER EVERY 6 HOURS AS NEEDED, Disp: 1080 mL, Rfl: 2 .  metFORMIN (GLUCOPHAGE) 500 MG tablet, TAKE 1 TABLET BY MOUTH EVERY DAY WITH BREAKFAST, Disp: , Rfl: 3 .  NOVOLIN 70/30 RELION (70-30) 100 UNIT/ML injection, INJECT 15 UNIT SUBCUTANEOUSLY BEFORE BREAKFAST AND 20 UNITS BEFORE EVENING MEAL. ADJUST  WEEKLY PER MD., Disp: 10 mL, Rfl: 0 .  pantoprazole (PROTONIX) 40 MG tablet, TAKE 1 TABLET (40 MG TOTAL) BY MOUTH DAILY. PLEASE CALL OFFICE TO SCHEDULE A FOLLOW-UP WITH PCP., Disp: 90 tablet, Rfl: 1 .  PARoxetine (PAXIL) 10 MG tablet, TAKE 1 TABLET(10 MG) BY MOUTH EVERY MORNING, Disp: 90 tablet, Rfl: 1 .  Potassium 99 MG TABS, Take 99 mg by mouth once. Reported on 09/20/2015, Disp: , Rfl:  .  promethazine (PHENERGAN) 12.5 MG tablet, TAKE 1 TABLET (12.5 MG TOTAL) BY MOUTH AT BEDTIME AS NEEDED FOR NAUSEA OR VOMITING., Disp: 30 tablet, Rfl: 0 .  rosuvastatin (CRESTOR) 40 MG tablet, TAKE 1 TABLET  BY MOUTH EVERY DAY, Disp: 90 tablet, Rfl: 1 .  Vitamin D, Ergocalciferol, (DRISDOL) 1.25 MG (50000 UT) CAPS capsule, TAKE 1 CAPSULE BY MOUTH ONE TIME PER WEEK, Disp: 4 capsule, Rfl: 2  EXAM:  General impression: alert, cooperative and articulate.  No signs of being in distress  Lungs: speech is fluent sentence length suggests that patient is not short of breath and not punctuated by cough, sneezing or sniffing. Marland Kitchen   Psych: affect normal.  speech is articulate and non pressured .  Denies suicidal thoughts   ASSESSMENT AND PLAN:  Diabetes mellitus type 2, uncontrolled, with complications She started taking ,ixed insulin in July,  currently taking 20 units qam and 25 units qpm , metformin and glipizide  Taking aspirin and rosuvastatin. Advised to increase the evening dose to 28 units   Lab Results  Component Value Date   HGBA1C 8.2 (H) 12/10/2018     Lab Results  Component Value Date   MICROALBUR 4.5 (H) 03/23/2018      Educated About Covid-19 Virus Infection Educated patient on the newly broadened list of signs and symptoms of COVID-19 infection and ways to avoid the viral infection including washing hands frequently with soap and water,  using hand sanitizer if unable to wash, avoiding touching face,  staying at home and limiting visitors,  and avoiding contact with people coming in and out of  home.  Discussed the potential ineffectiveness of hand sanitizer if left in environments > 110 degrees (ie , the car).  Reminded patient to call office with questions/concerns.  The importance of continued social distancing was discussed today . Patient was screened for the development of any unsafe behaviors or habits that may have developed as a result of the social impact of the virus , including alcohol abuse,  Domestic violence, tobacco abuse and overeating.     HTN (hypertension) Well controlled on current regimen based on home readings. . Renal function stable, no changes today.    I discussed the assessment and treatment plan with the patient. The patient was provided an opportunity to ask questions and all were answered. The patient agreed with the plan and demonstrated an understanding of the instructions.   The patient was advised to call back or seek an in-person evaluation if the symptoms worsen or if the condition fails to improve as anticipated.  I provided 25 minutes of non-face-to-face time during this encounter.   Crecencio Mc, MD

## 2019-02-20 DIAGNOSIS — Z7189 Other specified counseling: Secondary | ICD-10-CM | POA: Insufficient documentation

## 2019-02-20 NOTE — Assessment & Plan Note (Signed)
Educated patient on the newly broadened list of signs and symptoms of COVID-19 infection and ways to avoid the viral infection including washing hands frequently with soap and water,  using hand sanitizer if unable to wash, avoiding touching face,  staying at home and limiting visitors,  and avoiding contact with people coming in and out of home.  Discussed the potential ineffectiveness of hand sanitizer if left in environments > 110 degrees (ie , the car).  Reminded patient to call office with questions/concerns.  The importance of continued social distancing was discussed today . Patient was screened for the development of any unsafe behaviors or habits that may have developed as a result of the social impact of the virus , including alcohol abuse,  Domestic violence, tobacco abuse and overeating.    

## 2019-02-20 NOTE — Assessment & Plan Note (Signed)
She started taking ,ixed insulin in July,  currently taking 20 units qam and 25 units qpm , metformin and glipizide  Taking aspirin and rosuvastatin. Advised to increase the evening dose to 28 units   Lab Results  Component Value Date   HGBA1C 8.2 (H) 12/10/2018     Lab Results  Component Value Date   MICROALBUR 4.5 (H) 03/23/2018

## 2019-02-20 NOTE — Assessment & Plan Note (Signed)
Well controlled on current regimen based on home readings. Renal function stable, no changes today. 

## 2019-02-24 ENCOUNTER — Other Ambulatory Visit: Payer: Self-pay | Admitting: Internal Medicine

## 2019-03-04 ENCOUNTER — Other Ambulatory Visit: Payer: Self-pay | Admitting: Internal Medicine

## 2019-03-04 DIAGNOSIS — J449 Chronic obstructive pulmonary disease, unspecified: Secondary | ICD-10-CM | POA: Diagnosis not present

## 2019-03-07 ENCOUNTER — Other Ambulatory Visit: Payer: Self-pay | Admitting: Internal Medicine

## 2019-03-11 DIAGNOSIS — G839 Paralytic syndrome, unspecified: Secondary | ICD-10-CM | POA: Diagnosis not present

## 2019-04-03 DIAGNOSIS — J449 Chronic obstructive pulmonary disease, unspecified: Secondary | ICD-10-CM | POA: Diagnosis not present

## 2019-04-04 ENCOUNTER — Other Ambulatory Visit: Payer: Self-pay | Admitting: Internal Medicine

## 2019-04-09 ENCOUNTER — Other Ambulatory Visit: Payer: Self-pay | Admitting: Internal Medicine

## 2019-04-11 DIAGNOSIS — G839 Paralytic syndrome, unspecified: Secondary | ICD-10-CM | POA: Diagnosis not present

## 2019-05-01 ENCOUNTER — Other Ambulatory Visit: Payer: Self-pay | Admitting: Internal Medicine

## 2019-05-03 ENCOUNTER — Other Ambulatory Visit: Payer: Self-pay | Admitting: Internal Medicine

## 2019-05-04 DIAGNOSIS — J449 Chronic obstructive pulmonary disease, unspecified: Secondary | ICD-10-CM | POA: Diagnosis not present

## 2019-05-07 ENCOUNTER — Other Ambulatory Visit: Payer: Self-pay | Admitting: Internal Medicine

## 2019-05-08 ENCOUNTER — Other Ambulatory Visit: Payer: Self-pay | Admitting: Internal Medicine

## 2019-05-12 DIAGNOSIS — G839 Paralytic syndrome, unspecified: Secondary | ICD-10-CM | POA: Diagnosis not present

## 2019-05-26 ENCOUNTER — Other Ambulatory Visit: Payer: Self-pay | Admitting: Internal Medicine

## 2019-06-04 DIAGNOSIS — J449 Chronic obstructive pulmonary disease, unspecified: Secondary | ICD-10-CM | POA: Diagnosis not present

## 2019-06-05 ENCOUNTER — Other Ambulatory Visit: Payer: Self-pay | Admitting: Internal Medicine

## 2019-06-11 DIAGNOSIS — G839 Paralytic syndrome, unspecified: Secondary | ICD-10-CM | POA: Diagnosis not present

## 2019-06-18 ENCOUNTER — Other Ambulatory Visit: Payer: Self-pay | Admitting: Internal Medicine

## 2019-07-04 DIAGNOSIS — J449 Chronic obstructive pulmonary disease, unspecified: Secondary | ICD-10-CM | POA: Diagnosis not present

## 2019-07-06 ENCOUNTER — Other Ambulatory Visit: Payer: Self-pay | Admitting: Internal Medicine

## 2019-07-11 ENCOUNTER — Other Ambulatory Visit: Payer: Self-pay | Admitting: Internal Medicine

## 2019-07-11 DIAGNOSIS — Z1231 Encounter for screening mammogram for malignant neoplasm of breast: Secondary | ICD-10-CM

## 2019-07-12 DIAGNOSIS — G839 Paralytic syndrome, unspecified: Secondary | ICD-10-CM | POA: Diagnosis not present

## 2019-07-24 ENCOUNTER — Other Ambulatory Visit: Payer: Self-pay | Admitting: Internal Medicine

## 2019-07-26 ENCOUNTER — Other Ambulatory Visit: Payer: Self-pay | Admitting: Internal Medicine

## 2019-07-26 DIAGNOSIS — R69 Illness, unspecified: Secondary | ICD-10-CM | POA: Diagnosis not present

## 2019-08-03 ENCOUNTER — Other Ambulatory Visit: Payer: Self-pay | Admitting: Internal Medicine

## 2019-08-04 DIAGNOSIS — J449 Chronic obstructive pulmonary disease, unspecified: Secondary | ICD-10-CM | POA: Diagnosis not present

## 2019-08-11 DIAGNOSIS — G839 Paralytic syndrome, unspecified: Secondary | ICD-10-CM | POA: Diagnosis not present

## 2019-08-17 DIAGNOSIS — R69 Illness, unspecified: Secondary | ICD-10-CM | POA: Diagnosis not present

## 2019-08-19 ENCOUNTER — Telehealth: Payer: Self-pay | Admitting: Pulmonary Disease

## 2019-08-19 ENCOUNTER — Other Ambulatory Visit: Payer: Self-pay | Admitting: Internal Medicine

## 2019-08-19 MED ORDER — ALBUTEROL SULFATE HFA 108 (90 BASE) MCG/ACT IN AERS: INHALATION_SPRAY | RESPIRATORY_TRACT | 0 refills | Status: DC

## 2019-08-19 NOTE — Telephone Encounter (Signed)
Called and spoke to pt, who is requesting refill on albuterol.  Pt is past due for appt. Pt has been scheduled for OV with Jessie Foot, NP on 08/26/2019. One month supply of albuterol has been sent to preferred pharmacy.  Nothing further is needed.

## 2019-08-26 ENCOUNTER — Encounter: Payer: Self-pay | Admitting: Primary Care

## 2019-08-26 ENCOUNTER — Ambulatory Visit (INDEPENDENT_AMBULATORY_CARE_PROVIDER_SITE_OTHER): Payer: Medicare HMO | Admitting: Primary Care

## 2019-08-26 DIAGNOSIS — J849 Interstitial pulmonary disease, unspecified: Secondary | ICD-10-CM | POA: Diagnosis not present

## 2019-08-26 DIAGNOSIS — J449 Chronic obstructive pulmonary disease, unspecified: Secondary | ICD-10-CM | POA: Diagnosis not present

## 2019-08-26 DIAGNOSIS — J9611 Chronic respiratory failure with hypoxia: Secondary | ICD-10-CM | POA: Diagnosis not present

## 2019-08-26 MED ORDER — ALBUTEROL SULFATE HFA 108 (90 BASE) MCG/ACT IN AERS: INHALATION_SPRAY | RESPIRATORY_TRACT | 3 refills | Status: DC

## 2019-08-26 NOTE — Progress Notes (Signed)
Virtual Visit via Telephone Note  I connected with Catherine [name] on 08/26/19 at  4:30 PM EST by telephone and verified that I am speaking with the correct person using two identifiers.  Location: Patient: Home Provider: [employer]   I discussed the limitations, risks, security and privacy concerns of performing an evaluation and management service by telephone and the availability of in person appointments. I also discussed with the patient that there may be a patient responsible charge related to this service. The patient expressed understanding and agreed to proceed.  History of Present Illness: 63 year old year female. PMH significant for COPD, chronic respiratory failure, OSA (did not tolerate CPAP), HTN and CAD. Former patient of Dr. Alva Garnet, last seen Jan 2020.    08/26/2019 Patient contacted today for follow-up visit. States that her COPD symptoms are controlled on duoneb QID and prn albuterol. She reports shortness of breath with walking and dry cough. She has tried advair and anoro in the past with no reported benefit. Medicaiton cost is an issue. She was unable to afford Trelegy and did not complete PA paperwork.  She is on 4L oxygen continuously, no recent increase in demand. She tried pulmonary rehab once but states that she got sick afterwards and never went back.   Observations/Objective:  - No shortness of breath, wheezing or cough noted during phone conversation  DATA: 02/24/12 spirometry: Very severe obstruction (FEV1 0.83 L, 37% pred) 03/03/13 PSG: AHI 12.5 09/18/16 PFTs: FVC: 1.75 > 1.84 L (60 > 63 %pred), FEV1: 0.65 > 0.72 L (30 > 34 %pred), FEV1/FVC: 37%, TLC: 3.67 L (78 %pred), DLCO 41 %pred, DLCO/VA 117%.  Flow volume curve consistent with severe obstruction  05/13/17 CT chest: Mild centrilobular emphysema. There is somewhat upper lobe predominant peribronchovascular ground-glass and nodularity, new. Mild scattered associated bronchiectasis/bronchiolectasis.  Additional scattered pulmonary nodules, measure up to 7 mm, stable from 08/11/2016 06/03/18 HRCT chest: The previously seen groundglass opacities have resolved.  There are multiple pulmonary nodules, unchanged since 2017 and therefore deemed benign. There is really no underlying pulmonary fibrosis to speak of.  Mild centrilobular emphysema noted.  Assessment and Plan:  COPD - Appears somewhat stable, however, not currently on any maintenance inhalers - Unable to afford Trelegy (she has tried Advair and Anoro) - Using Duonebs QID and prn albuterol upwards of 4 times a day  - I believe she would benefit from either LABA/LAMA or triple therapy, refer to [facility] to help with COPD medication management/cost inhalers (compliance and medication cost is significant barrier)  Chronic respiratory failure with hypoxia - Continues 4L oxygen, no additional O2 requirements   Follow Up Instructions:   - FU on 3 months to establish care with Dr. Patsey Berthold   I discussed the assessment and treatment plan with the patient. The patient was provided an opportunity to ask questions and all were answered. The patient agreed with the plan and demonstrated an understanding of the instructions.   The patient was advised to call back or seek an in-person evaluation if the symptoms worsen or if the condition fails to improve as anticipated.  I provided 18 minutes of non-face-to-face time during this encounter.   Martyn Ehrich, NP

## 2019-08-26 NOTE — Patient Instructions (Addendum)
Recommendations: - Continue Duoneb four times a day - Use albuterol rescue inhaler every 4-6 hours for breakthrough shortness of breath  Referral: [facility] for COPD mediction management /inhaler cost   Follow-up: - Dr Jayme Cloud in 3 months    COPD and Physical Activity Chronic obstructive pulmonary disease (COPD) is a long-term (chronic) condition that affects the lungs. COPD is a general term that can be used to describe many different lung problems that cause lung swelling (inflammation) and limit airflow, including chronic bronchitis and emphysema. The main symptom of COPD is shortness of breath, which makes it harder to do even simple tasks. This can also make it harder to exercise and be active. Talk with your health care provider about treatments to help you breathe better and actions you can take to prevent breathing problems during physical activity. What are the benefits of exercising with COPD? Exercising regularly is an important part of a healthy lifestyle. You can still exercise and do physical activities even though you have COPD. Exercise and physical activity improve your shortness of breath by increasing blood flow (circulation). This causes your heart to pump more oxygen through your body. Moderate exercise can improve your:  Oxygen use.  Energy level.  Shortness of breath.  Strength in your breathing muscles.  Heart health.  Sleep.  Self-esteem and feelings of self-worth.  Depression, stress, and anxiety levels. Exercise can benefit everyone with COPD. The severity of your disease may affect how hard you can exercise, especially at first, but everyone can benefit. Talk with your health care provider about how much exercise is safe for you, and which activities and exercises are safe for you. What actions can I take to prevent breathing problems during physical activity?  Sign up for a pulmonary rehabilitation program. This type of program may include: ? Education  about lung diseases. ? Exercise classes that teach you how to exercise and be more active while improving your breathing. This usually involves:  Exercise using your lower extremities, such as a stationary bicycle.  About 30 minutes of exercise, 2 to 5 times per week, for 6 to 12 weeks  Strength training, such as push ups or leg lifts. ? Nutrition education. ? Group classes in which you can talk with others who also have COPD and learn ways to manage stress.  If you use an oxygen tank, you should use it while you exercise. Work with your health care provider to adjust your oxygen for your physical activity. Your resting flow rate is different from your flow rate during physical activity.  While you are exercising: ? Take slow breaths. ? Pace yourself and do not try to go too fast. ? Purse your lips while breathing out. Pursing your lips is similar to a kissing or whistling position. ? If doing exercise that uses a quick burst of effort, such as weight lifting:  Breathe in before starting the exercise.  Breathe out during the hardest part of the exercise (such as raising the weights). Where to find support You can find support for exercising with COPD from:  Your health care provider.  A pulmonary rehabilitation program.  Your local health department or community health programs.  Support groups, online or in-person. Your health care provider may be able to recommend support groups. Where to find more information You can find more information about exercising with COPD from:  American Lung Association: [place].hu.  COPD Foundation: AlmostHot.gl. Contact a health care provider if:  Your symptoms get worse.  You have  chest pain.  You have nausea.  You have a fever.  You have trouble talking or catching your breath.  You want to start a new exercise program or a new activity. Summary  COPD is a general term that can be used to describe many different lung problems  that cause lung swelling (inflammation) and limit airflow. This includes chronic bronchitis and emphysema.  Exercise and physical activity improve your shortness of breath by increasing blood flow (circulation). This causes your heart to provide more oxygen to your body.  Contact your health care provider before starting any exercise program or new activity. Ask your health care provider what exercises and activities are safe for you. This information is not intended to replace advice given to you by your health care provider. Make sure you discuss any questions you have with your health care provider. Document Released: 09/17/2017 Document Revised: 12/15/2018 Document Reviewed: 09/17/2017 Elsevier Patient Education  2020 [employer].

## 2019-08-28 ENCOUNTER — Encounter: Payer: Self-pay | Admitting: Primary Care

## 2019-09-03 DIAGNOSIS — J449 Chronic obstructive pulmonary disease, unspecified: Secondary | ICD-10-CM | POA: Diagnosis not present

## 2019-09-08 DIAGNOSIS — R69 Illness, unspecified: Secondary | ICD-10-CM | POA: Diagnosis not present

## 2019-09-13 ENCOUNTER — Other Ambulatory Visit: Payer: Self-pay | Admitting: *Deleted

## 2019-09-13 ENCOUNTER — Encounter: Payer: Self-pay | Admitting: *Deleted

## 2019-09-13 NOTE — Patient Outreach (Signed)
[facility] Customer service manager [facility]) Care Management [facility] Community CM Telephone Outreach- Routine referral Unsuccessful outreach attempt # 1- new patient  09/13/2019  Catherine [name] 12/18/1955 [id number]  Unsuccessful telephone outreach attempt x 2 to [name], 64 y/o female referred routinely to [facility] CM by patient's pulmonary provider on 08/29/2019 for medication management.  Patient has had no recent hospitalizations; has history including, but not limited to, CAD; HTN/ HLD; COPD/ OSA, on home O2 and CPAP; DM- type II; anxiety/ depression; obesity; and GERD.  With call attempts today to both phone numbers listed for patient, was unable to leave patient HIPAA compliant voice mail message requesting call back-- received automated outgoing message on mobile number stating that patient does not have voice mail set up; number listed as home number was busy.  Plan:  Will place [facility] Community CM unsuccessful patient outreach letter in mail requesting call back in writing  Will re-attempt [facility] CM telephone outreach within 4 business days if I do not hear back from patient first  Caryl Pina, RN, BSN, [employer] [facility] Care Management  [phone]

## 2019-09-14 ENCOUNTER — Encounter: Payer: Self-pay | Admitting: *Deleted

## 2019-09-14 ENCOUNTER — Other Ambulatory Visit: Payer: Self-pay | Admitting: *Deleted

## 2019-09-14 NOTE — Patient Outreach (Signed)
[facility] Customer service manager [facility]) Care Management [facility] CM Telephone Outreach, routine referral outreach for screening  09/14/2019  Catherine [name] 05-18-1956 [id number]  Successful telephone outreach attempt to Catherine [name], 64 y/o female referred routinely to [facility] CM by patient's pulmonary provider on 08/29/2019 for medication management.  Patient has had no recent hospitalizations; has history including, but not limited to, CAD; HTN/ HLD; COPD/ OSA, on home O2 and CPAP; DM- type II; anxiety/ depression; obesity; and GERD.  HIPAA/ identity verified with patient today and purpose of call/ [facility] CM services/ role was discussed with patient; patient immediately states that she "only needs help with affording medications," however she is agreeable to screening outreach.  Patient reports "doing fine" today and she denies pain/ new recent falls.  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  Patient sounds to be in no distress and screening call was completed.    Discussed with patient that I could continue to follow to assist with self-management of COPD, however, patient insists that she "knows how/ what to do to take care of" herself/ COPD; she declines ongoing follow up by [facility] RN CM, but does agree to my placing a referral for medication assistance for "all of my medicine, but especially for the inhalers."  Patient stated that her pulmonologist wants to prescribe for her "either" Symbicort or Trellegy, stating she is not sure which- but that she is unable to afford these medications; she also mentions that her albuterol rescue inhaler is "very expensive."  Patient also agrees to my placing educational material in the mail to her around Advanced Directive planning, as she currently does  not have these documents in place; basics of Advanced Directive planing discussed with patient today.    Discussed with patient that I would place [facility] CM Pharmacy referral for [facility] CM Pharmacist embedded at her PCP office; patient was previously engaged with [facility] Pharmacist intern at PCP office, so she is familiar with this services and very agreeable.  Patient asks that I share with [facility] Pharmacist that she prefers call at 3 pm "or after" as she cares for her granddaughter during the day and is very busy on most days.  Patient denies further issues, concerns, or problems today.  I provided/ confirmed that patient has my direct phone number, the main [facility] CM office phone number, and the [facility] CM 24-hour nurse advice phone number should she change her mind and wish to participate in [facility] CM services in the future  Plan:  I will send referral to [facility] Pharmacist Catherine [name], embedded at PCP office to follow up with patient on her concerns with affording medications  I will place printed educational material in mail to patient around Advanced Directive planning  I will make patient's PCP aware that patient has declined ongoing follow up with [facility] RN CM  Catherine Pina, RN, BSN, CCRN [facility] Coordinator [facility] Care Management  [phone]

## 2019-09-15 ENCOUNTER — Ambulatory Visit: Payer: Self-pay | Admitting: Pharmacist

## 2019-09-15 DIAGNOSIS — I10 Essential (primary) hypertension: Secondary | ICD-10-CM

## 2019-09-15 DIAGNOSIS — E1165 Type 2 diabetes mellitus with hyperglycemia: Secondary | ICD-10-CM

## 2019-09-15 DIAGNOSIS — E782 Mixed hyperlipidemia: Secondary | ICD-10-CM

## 2019-09-15 DIAGNOSIS — IMO0002 Reserved for concepts with insufficient information to code with codable children: Secondary | ICD-10-CM

## 2019-09-15 NOTE — Chronic Care Management (AMB) (Signed)
  Chronic Care Management   Note  09/15/2019 Name: Catherine [name] MRN: [id number] DOB: 1956-01-06  [name] is a 64 y.o. year old female who is a primary care patient of Darrick Huntsman, [name], MD. The CCM team was consulted for assistance with chronic disease management and care coordination needs.    Received referral from RN CM for medication access and medication management. Will place CCM referral and will collaborate w/ Care Guide to outreach patient and schedule initial call with me.   [name], PharmD, [place], CPP Clinical Pharmacist [facility] [place] [employer] [phone]

## 2019-09-20 ENCOUNTER — Other Ambulatory Visit: Payer: Self-pay | Admitting: Pharmacist

## 2019-09-20 NOTE — Patient Outreach (Signed)
[facility]) Care Management  [place]   09/20/2019  Catherine [name] 24-Feb-1956 [id number]  Reason for referral: Medication Assistance, Medication Management   Referral source: MD (Pulmonary) Current insurance: Silver Scripts through [employer]  PMHx includes but not limited to:  CAD, HTN, HLD, COPD on home 02, T2DM, anxiety / depression, obesity, GERD  Outreach:  Successful telephone call with patient.  HIPAA identifiers verified.   Subjective:  Patient agreeable to review medications.  She states she can only talk after 3PM during the week.    Patient reports that she has to use albuterol inhaler several times a day regularly.  She states she will alternate between nebulizer and inhaler as needed.  She states her pulmonary provider would like her to be taking a maintenance inhaler, Trelegy or Symbicort, but she could not afford these last year.  -Patient is currently only taking albuterol and duonebs.  -Per office visit note, patient has failed Advair or Anoro in the past as these did not provide benefit per her report.   Preference for Trelegy or LABA/LAMA therapy.     Patient reports she has not been taking PM dose of Novolin 70/30 insulin at night a few times this week due to "low" CBG readings.   Patient reports she has a new insurance this year [employer], dispensing) and had not tried to have any medications filled yet through the pharmacy to see what new co-pays or coverage will be.    Objective: The ASCVD Risk score Mikey Bussing DC Jr., et al., 2013) failed to calculate for the following reasons:   The systolic blood pressure is missing  Lab Results  Component Value Date   CREATININE 0.86 12/10/2018   CREATININE 1.05 03/23/2018   CREATININE 0.90 02/09/2017    Lab Results  Component Value Date   HGBA1C 8.2 (H) 12/10/2018    Lipid Panel     Component Value Date/Time   CHOL 139 12/10/2018 1447   CHOL 185 11/11/2011 0914   TRIG 177 (H) 12/10/2018 1447    TRIG 166 11/11/2011 0914   HDL 41 (L) 12/10/2018 1447   HDL 47 11/11/2011 0914   CHOLHDL 3.4 12/10/2018 1447   VLDL 37.6 03/23/2018 1640   VLDL 33 11/11/2011 0914   LDLCALC 73 12/10/2018 1447   LDLCALC 105 (H) 11/11/2011 0914   LDLDIRECT 70 12/10/2018 1447    BP Readings from Last 3 Encounters:  09/16/18 (!) 120/58  06/10/18 128/80  05/06/18 (!) 150/80    Allergies  Allergen Reactions  . Sulfa Antibiotics Swelling    Patient reports caused cellulitis Other reaction(s): Other (See Comments) cellulitis  . Hydroxychloroquine     Other reaction(s): Other (See Comments) Gi upset  . Oxycodone-Acetaminophen   . Percocet [Oxycodone-Acetaminophen]   . Vicodin [Hydrocodone-Acetaminophen]   . Aspirin Other (See Comments)    Other reaction(s): Blood Disorder  . Ibuprofen     Other reaction(s): Blood Disorder  . Metformin And Related Other (See Comments)    Constipation at 500 mg daily    Medications Reviewed Today    Reviewed by Knox Royalty, RN (Registered Nurse) on 09/14/19 at 61  Med List Status: <None>  Medication Order Taking? Sig Documenting Provider Last Dose Status Informant  ACCU-CHEK SMARTVIEW test strip [id number] No TEST BLOOD SUGAR FOUR TIMES DAILY Crecencio Mc, MD Taking Active            Med Note Thurmond Butts, [place] Sep 14, 2017  3:54 PM) Using  Relion Meter from [employer]  albuterol (VENTOLIN HFA) 108 (90 Base) MCG/ACT inhaler [id number]  USE 2 PUFFS EVERY 6 HOURS AS NEEDED FOR WHEEZING Martyn Ehrich, NP  Active   aspirin 81 MG EC tablet [id number] No Take 81 mg by mouth daily.   [provider] Taking Active   butalbital-acetaminophen-caffeine (FIORICET, ESGIC) [phone] MG tablet [id number] No TAKE 1 TABLET BY MOUTH EVERY 4 HOURS AS NEEDED FOR HEADACHE Crecencio Mc, MD Taking Active   cholecalciferol (VITAMIN D) 25 MCG (1000 UT) tablet [id number] No Take 1,000 Units by mouth daily. [provider] Taking Active   docusate sodium  (COLACE) 100 MG capsule [id number] No Take 200 mg by mouth daily. Reported on 09/20/2015 [provider] Taking Active   glipiZIDE (GLUCOTROL) 5 MG tablet [id number] No TAKE 1 TABLET BY MOUTH BEFORE BREAKFAST AND 2 TABLETS BEFORE Reymundo Poll, MD Taking Active   Insulin Pen Needle 29G X 5MM MISC [id number] No 1 each by Does not apply route 3 (three) times daily. Dx: Crecencio Mc, MD Taking Active   Insulin Syringe-Needle U-100 (RELION INSULIN SYRINGE) 31G X 15/64" 0.3 ML MISC [id number] No Use 2 syringes daily with isulin Crecencio Mc, MD Taking Active   ipratropium-albuterol (DUONEB) 0.5-2.5 (3) MG/3ML SOLN [id number] No USE 1 VIAL VIA NEBULIZER EVERY 6 HOURS AS NEEDED Crecencio Mc, MD Taking Active   metFORMIN (GLUCOPHAGE) 500 MG tablet [id number] No TAKE 1 TABLET BY MOUTH EVERY DAY WITH BREAKFAST Crecencio Mc, MD Taking Active   NOVOLIN 70/30 RELION (70-30) 100 UNIT/ML injection [id number] No INJECT 15 UNITS SUBCUTANEOUSLY BEFORE BREAKFAST AND 20 UNITS BEFORE EVENING MEAL ADJUST WEEKLY PER MD Crecencio Mc, MD Taking Active   PARoxetine (PAXIL) 10 MG tablet [id number] No TAKE 1 TABLET(10 MG) BY MOUTH EVERY MORNING Crecencio Mc, MD Taking Active   Potassium 99 MG TABS [id number] No Take 99 mg by mouth once. Reported on 09/20/2015 [provider] Taking Active            Med Note (CAULFIELD, ASHLEY L   Thu Dec 06, 2015 10:41 AM)    promethazine (PHENERGAN) 12.5 MG tablet [id number] No TAKE 1 TABLET (12.5 MG TOTAL) BY MOUTH AT BEDTIME AS NEEDED FOR NAUSEA OR VOMITING. Crecencio Mc, MD Taking Active   rosuvastatin (CRESTOR) 40 MG tablet [id number] No TAKE 1 TABLET BY MOUTH EVERY DAY Crecencio Mc, MD Taking Active           Assessment: Drugs sorted by system:  Neurologic/Psychologic: melatonin, paroxetine  Hematologic: aspirin 2m  Cardiovascular: rosuvastatin  Pulmonary/Allergy: albuterol inhaler + nebulizer, duonebs  Gastrointestinal: colace,  probiotic, promethazine  Endocrine: glipizide, metformin, Novolin 70/30  Pain: butalbital-APAP-caffeine  Vitamins/Minerals/Supplements: vitamin D, magnesium, potassium  Medication Review Findings:  . Several allergies listed however patient appears to be tolerating current medications without issues  . Novolin 70/30: Not taking q12h regularly, taking this formulation due to cost most likely, could adjust therapy to change to or add GLP-1RA or SGLT-2.   Medication Assistance Findings:  Medication assistance needs identified: Inhaler therapy / insulin  Extra Help:  Not eligible for Extra Help Low Income Subsidy based on reported income and assets  Patient Assistance Programs: Trelegy made by G[place]onrequirement met: Yes o Out-of-pocket prescription expenditure met:   No ($600) - Reviewed program requirements with patient.   - Patient thinks she would likely qualify based on income but has not met TROOP.   - Patient  also has switched insurance companies in 2021.  Per review of silver script insurance, Trelegy is listed as Tier 3 / $47 copay.   - Patient will call pharmacy to inquire about co-pay coverage.  She states she can afford this co-pay if it is covered.  If it is not, we will review alternative options for patient for LAMA/LABA.    Novolin 70/30 made by Secretary requirement met: Yes o Out-of-pocket prescription expenditure met:   Not Applicable - Patient has met application requirements to apply for this program.  - Reviewed program requirements with patient.   - Will reach out to PCP to discuss diabetes regimen.  Can assist with PAPs for Novolin or other medication (ZSMO-7/MBEMLJQGB or GLP-1/Trulicity or Ozempic) depending on provider preference  Additional medication assistance options reviewed with patient as warranted:  Insurance OTC catalogue  Plan: . Will f/u with patient later this week regarding Trelegy co-pay . Will route not to PCP regarding  diabetes regimen  Ralene Bathe, PharmD, [place] [phone]

## 2019-09-23 ENCOUNTER — Other Ambulatory Visit: Payer: Self-pay | Admitting: Internal Medicine

## 2019-09-28 ENCOUNTER — Other Ambulatory Visit: Payer: Self-pay | Admitting: Internal Medicine

## 2019-09-30 ENCOUNTER — Other Ambulatory Visit: Payer: Self-pay

## 2019-09-30 ENCOUNTER — Telehealth: Payer: Self-pay | Admitting: Internal Medicine

## 2019-09-30 MED ORDER — IPRATROPIUM-ALBUTEROL 0.5-2.5 (3) MG/3ML IN SOLN: RESPIRATORY_TRACT | 2 refills | Status: DC

## 2019-09-30 NOTE — Telephone Encounter (Signed)
Prescription refill sent.

## 2019-09-30 NOTE — Telephone Encounter (Signed)
Pt needs refill on ipratropium-albuterol (DUONEB) 0.5-2.5 (3) MG/3ML SOLN. I scheduled appt for 10/10/2019

## 2019-10-03 ENCOUNTER — Inpatient Hospital Stay: Admission: RE | Admit: 2019-10-03 | Payer: Medicare HMO | Source: Ambulatory Visit

## 2019-10-05 ENCOUNTER — Other Ambulatory Visit: Payer: Self-pay | Admitting: Internal Medicine

## 2019-10-10 ENCOUNTER — Encounter: Payer: Self-pay | Admitting: Internal Medicine

## 2019-10-10 ENCOUNTER — Other Ambulatory Visit: Payer: Self-pay

## 2019-10-10 ENCOUNTER — Ambulatory Visit (INDEPENDENT_AMBULATORY_CARE_PROVIDER_SITE_OTHER): Payer: Medicare Other | Admitting: Internal Medicine

## 2019-10-10 ENCOUNTER — Other Ambulatory Visit: Payer: Self-pay | Admitting: Internal Medicine

## 2019-10-10 VITALS — BP 140/78 | Ht 63.0 in | Wt 278.0 lb

## 2019-10-10 DIAGNOSIS — R5383 Other fatigue: Secondary | ICD-10-CM

## 2019-10-10 DIAGNOSIS — E1165 Type 2 diabetes mellitus with hyperglycemia: Secondary | ICD-10-CM | POA: Diagnosis not present

## 2019-10-10 DIAGNOSIS — IMO0002 Reserved for concepts with insufficient information to code with codable children: Secondary | ICD-10-CM

## 2019-10-10 DIAGNOSIS — E118 Type 2 diabetes mellitus with unspecified complications: Secondary | ICD-10-CM

## 2019-10-10 DIAGNOSIS — J441 Chronic obstructive pulmonary disease with (acute) exacerbation: Secondary | ICD-10-CM | POA: Diagnosis not present

## 2019-10-10 MED ORDER — BUTALBITAL-APAP-CAFFEINE 50-325-40 MG PO TABS: 1.0000 | ORAL_TABLET | Freq: Four times a day (QID) | ORAL | 5 refills | Status: DC | PRN

## 2019-10-10 MED ORDER — IPRATROPIUM-ALBUTEROL 0.5-2.5 (3) MG/3ML IN SOLN: RESPIRATORY_TRACT | 2 refills | Status: DC

## 2019-10-10 MED ORDER — PREDNISONE 10 MG PO TABS: ORAL_TABLET | ORAL | 0 refills | Status: DC

## 2019-10-10 MED ORDER — DOXYCYCLINE HYCLATE 100 MG PO TABS: 100.0000 mg | ORAL_TABLET | Freq: Two times a day (BID) | ORAL | 0 refills | Status: DC

## 2019-10-10 NOTE — Patient Instructions (Signed)
You are having a COPD exacerbation   If you become more short of breath than you are now,  You may  need to go to the nearest ER immediately or call 911  I a treating you  with the following:  Prednisone tapering dose for the next 8 days   Doxycycline two times daily with food  7 to 10  days (antibiotic)  Increase your insulin by 5 units on each end for the elevations in blood sugar you will experience   Please take a probiotic ( Align, Floraque or Culturelle), or  the generic version of one of these  For a minimum of 3 weeks to prevent a serious antibiotic associated diarrhea  Called clostridium dificile colitis    You need to return in late February for fasting labs

## 2019-10-10 NOTE — Progress Notes (Signed)
Virtual Visit via [email or web address]  This visit type was conducted due to national recommendations for restrictions regarding the COVID-19 pandemic (e.g. social distancing).  This format is felt to be most appropriate for this patient at this time.  All issues noted in this document were discussed and addressed.  No physical exam was performed (except for noted visual exam findings with Video Visits).   I connected with@ on 10/10/19 at  3:30 PM EST by a video enabled telemedicine application  and verified that I am speaking with the correct person using two identifiers. Location patient: home Location provider: work or home office Persons participating in the virtual visit: patient, provider  I discussed the limitations, risks, security and privacy concerns of performing an evaluation and management service by telephone and the availability of in person appointments. I also discussed with the patient that there may be a patient responsible charge related to this service. The patient expressed understanding and agreed to proceed.   Reason for visit: FOLLOW UP  HPI:  64 yr old female with advanced COPD , 02 requiring,  On 4 L chronically, with suboptimal management of COPD due to cost of  Inhaled LABA/LAMA , presents for follow up on COD and  type 2 DM    T2DM:  She is managing with twice daily dosing of mixed insulin  , 18 units in the morning and 20 to 25 units pre dinner.  Sugars have ranged from 130 to 180.  Has not had a1c since April, because she has not left the house since the [place] epidemic.  She buys her insulinat [facility] because the cost is less without using her insurance.   COPD:  She has been having trouble breathing for the last 48 hours. Per husband she is coughing more ,  Cough is More productive than usual but not purulent,  Denies fevers body aches and nausea  Some wheezing,  Has not taken prednisone in several months.   Has been using her Duoneb every 4 to 6 hours, with transient relief   sats have been 96%.  She has been waiting for assistance with medications because Triliegy OOP cost is $400.    ROS: See pertinent positives and negatives per HPI.  Past Medical History:  Diagnosis Date  . Anxiety   . Asthma   . COPD (chronic obstructive pulmonary disease) ([place])   . Depression   . Diabetes mellitus   . Gastritis and duodenitis June 2011   EGD  . GERD (gastroesophageal reflux disease)   . Hyperglycemia   . Hyperlipidemia   . Hypertension   . Leukocytosis   . Obesity (BMI 30-39.9)   . S/P cardiac catheterization March 2012   normal coronaries,  due to chest pain ([place])  . Screening for breast cancer Jul 28 2011   normal  . Screening for colon cancer June 2011   polyps found, next one due 2014 ([place])  . Tobacco abuse, in remission    quit oct during hospitalization     Past Surgical History:  Procedure Laterality Date  . ABDOMINAL HYSTERECTOMY    . ABDOMINAL HYSTERECTOMY    . BILATERAL OOPHORECTOMY  1995   and TAH.  noncancerous reasons  . CARDIAC CATHETERIZATION  11/22/2010   no significant disease  . HEMORRHOID SURGERY      Family History  Problem Relation Age of Onset  . Coronary artery disease Mother 49  . Heart disease Mother        CABG x5  . COPD  Mother 58  . Breast cancer Mother   . Kidney disease Mother   . Heart failure Father   . Angina Father   . Diabetes Father   . Depression Father   . Stroke Father   . Dementia Father   . Asthma Brother   . Asthma Brother   . Diabetes Brother   . Hypertension Brother   . Cancer Sister        breast, lung, brain    SOCIAL HX:  reports that she quit smoking about 8 years ago. Her smoking use included cigarettes. She has a 40.00 pack-year smoking history. She has never used smokeless tobacco. She reports that she does not drink alcohol or use drugs.   Current Outpatient Medications:  .  albuterol (VENTOLIN HFA) 108 (90 Base) MCG/ACT inhaler, USE 2 PUFFS EVERY 6 HOURS AS NEEDED FOR  WHEEZING, Disp: 18 g, Rfl: 3 .  aspirin 81 MG EC tablet, Take 81 mg by mouth daily.  , Disp: , Rfl:  .  Blood Glucose Monitoring Suppl (RELION CONFIRM GLUCOSE MONITOR) w/Device KIT, 1 strip by Does not apply route daily. Check CBG once daily 2 hours after supper, Disp: , Rfl:  .  butalbital-acetaminophen-caffeine (FIORICET) 50-325-40 MG tablet, Take 1 tablet by mouth every 6 (six) hours as needed for headache., Disp: 30 tablet, Rfl: 5 .  Cholecalciferol (VITAMIN D3) 125 MCG (5000 UT) CAPS, Take 1,000 Units by mouth daily. , Disp: , Rfl:  .  docusate sodium (COLACE) 100 MG capsule, Take 200 mg by mouth daily. Reported on 09/20/2015, Disp: , Rfl:  .  glipiZIDE (GLUCOTROL) 5 MG tablet, TAKE 1 TABLET BY MOUTH BEFORE BREAKFAST AND 2 TABLETS BEFORE DINNER, Disp: 270 tablet, Rfl: 2 .  Insulin Pen Needle 29G X 5MM MISC, 1 each by Does not apply route 3 (three) times daily. Dx:, Disp: 200 each, Rfl: 1 .  Insulin Syringe-Needle U-100 (RELION INSULIN SYRINGE) 31G X 15/64" 0.3 ML MISC, Use 2 syringes daily with isulin, Disp: 90 each, Rfl: 2 .  ipratropium-albuterol (DUONEB) 0.5-2.5 (3) MG/3ML SOLN, USE 1 VIAL VIA NEBULIZER EVERY 6 HOURS AS NEEDED, Disp: 3 mL, Rfl: 2 .  Lactobacillus (PROBIOTIC ACIDOPHILUS PO), Take 1 capsule by mouth daily., Disp: , Rfl:  .  magnesium oxide (MAG-OX) 400 MG tablet, Take 400 mg by mouth every other day., Disp: , Rfl:  .  Melatonin 5 MG TABS, Take 5 mg by mouth at bedtime., Disp: , Rfl:  .  metFORMIN (GLUCOPHAGE) 500 MG tablet, TAKE 1 TABLET BY MOUTH EVERY DAY WITH BREAKFAST (Patient taking differently: daily with supper. ), Disp: 90 tablet, Rfl: 1 .  NOVOLIN 70/30 RELION (70-30) 100 UNIT/ML injection, INJECT 15 UNITS SUBCUTANEOUSLY BEFORE BREAKFAST AND 20 UNITS BEFORE EVENING MEAL ADJUST WEEKLY PER MD, Disp: 10 mL, Rfl: 0 .  OXYGEN, Inhale into the lungs continuous. 4L continuous, Disp: , Rfl:  .  PARoxetine (PAXIL) 10 MG tablet, TAKE 1 TABLET(10 MG) BY MOUTH EVERY MORNING, Disp:  90 tablet, Rfl: 1 .  Potassium 99 MG TABS, Take 99 mg by mouth daily. Reported on 09/20/2015, Disp: , Rfl:  .  promethazine (PHENERGAN) 12.5 MG tablet, TAKE 1 TABLET (12.5 MG TOTAL) BY MOUTH AT BEDTIME AS NEEDED FOR NAUSEA OR VOMITING., Disp: 30 tablet, Rfl: 0 .  rosuvastatin (CRESTOR) 40 MG tablet, TAKE 1 TABLET BY MOUTH EVERY DAY, Disp: 90 tablet, Rfl: 1 .  doxycycline (VIBRA-TABS) 100 MG tablet, Take 1 tablet (100 mg total) by mouth 2 (two)  times daily., Disp: 20 tablet, Rfl: 0 .  predniSONE (DELTASONE) 10 MG tablet, 6 tablets daily for 3 days, then reduce by 1 tablet daily until gone, Disp: 33 tablet, Rfl: 0  EXAM:  VITALS per patient if applicable:  GENERAL: alert, oriented, appears well and in no acute distress. Speaking in full sentences   HEENT: atraumatic, conjunctiva clear, no obvious abnormalities on inspection of external nose and ears  NECK: normal movements of the head and neck  LUNGS: on inspection no signs of respiratory distress, breathing rate appears normal, no obvious gross SOB, gasping or wheezing  CV: no obvious cyanosis  MS: moves all visible extremities without noticeable abnormality  PSYCH/NEURO: pleasant and cooperative, no obvious depression or anxiety, speech and thought processing grossly intact  ASSESSMENT AND PLAN:  Discussed the following assessment and plan:  Diabetes mellitus type 2, uncontrolled, with complications (HCC) - Plan: Hemoglobin A1c, Comprehensive metabolic panel, Microalbumin / creatinine urine ratio, Lipid panel  Fatigue, unspecified type - Plan: CBC with Differential/Platelet  COPD exacerbation (HCC)  COPD exacerbation Will start prednisone taper,  Doxycyline.  Low risk of COVID exposure   Diabetes mellitus type 2, uncontrolled, with complications Advised to increase insulin by 5 units bid during use of prednisone to prevent severe hyperglycemia.  Last a1c was  High and she has been advised to rtc in 3 weeks for labs.  Lab  Results  Component Value Date   HGBA1C 8.2 (H) 12/10/2018       I discussed the assessment and treatment plan with the patient. The patient was provided an opportunity to ask questions and all were answered. The patient agreed with the plan and demonstrated an understanding of the instructions.   The patient was advised to call back or seek an in-person evaluation if the symptoms worsen or if the condition fails to improve as anticipated.  I provided 30  minutes of non-face-to-face time during this encounter reviewing patient's current problems and past  procedures/imaging studies, providing counseling on the above mentioned problems , and coordination  of care . Crecencio Mc, MD

## 2019-10-11 NOTE — Assessment & Plan Note (Signed)
Will start prednisone taper,  Doxycyline.  Low risk of COVID exposure

## 2019-10-11 NOTE — Assessment & Plan Note (Signed)
Advised to increase insulin by 5 units bid during use of prednisone to prevent severe hyperglycemia.  Last a1c was  High and she has been advised to rtc in 3 weeks for labs.  Lab Results  Component Value Date   HGBA1C 8.2 (H) 12/10/2018

## 2019-10-13 ENCOUNTER — Inpatient Hospital Stay: Admission: RE | Admit: 2019-10-13 | Payer: Medicare Other | Source: Ambulatory Visit

## 2019-11-07 ENCOUNTER — Other Ambulatory Visit: Payer: Self-pay | Admitting: Internal Medicine

## 2019-11-07 DIAGNOSIS — R69 Illness, unspecified: Secondary | ICD-10-CM | POA: Diagnosis not present

## 2019-11-24 DIAGNOSIS — J449 Chronic obstructive pulmonary disease, unspecified: Secondary | ICD-10-CM | POA: Diagnosis not present

## 2019-11-24 DIAGNOSIS — G839 Paralytic syndrome, unspecified: Secondary | ICD-10-CM | POA: Diagnosis not present

## 2019-11-28 ENCOUNTER — Other Ambulatory Visit: Payer: Self-pay | Admitting: Pharmacist

## 2019-11-28 ENCOUNTER — Telehealth: Payer: Self-pay | Admitting: Internal Medicine

## 2019-11-28 DIAGNOSIS — R69 Illness, unspecified: Secondary | ICD-10-CM | POA: Diagnosis not present

## 2019-11-28 NOTE — Patient Outreach (Signed)
[facility])  [place] pharmacy referral received in January 2021 for medication assistance for Trelegy and insulin.     Communication sent to Dr. Derrel Nip regarding possible substitution from Novolin 70/30 however provider has not sent back therapy recommendations yet.    Patient had not yet met TROOP for Trelegy due to not having spent $600 in out-of-pocket co-pays on medications since Jan 1.  She may now qualify for Symbicort and Spiriva since [employer] waived TROOP and [place] does not require TROOP either.   Call placed to [facility] pharmacist Catie Darnelle Maffucci who is embedded in office.  Catie will discuss with Dr. Derrel Nip and reach out to patient afterwards regarding optimizing therapy and patient assistance program options.    Ralene Bathe, PharmD, [place] [phone]

## 2019-11-28 NOTE — Chronic Care Management (AMB) (Signed)
  Chronic Care Management   Note  11/28/2019 Name: Catherine [name] MRN: [id number] DOB: 09-Dec-1955  Catherine [name] is a 64 y.o. year old female who is a primary care patient of Derrel Nip, Aris Everts, MD. I reached out to Magda Paganini by phone today in response to a referral sent by Ms. Laureen Ochs PCP, Dr. Deborra Medina     Ms. Estabrooks was given information about Chronic Care Management services today including:  1. CCM service includes personalized support from designated clinical staff supervised by her physician, including individualized plan of care and coordination with other care providers 2. 24/7 contact phone numbers for assistance for urgent and routine care needs. 3. Service will only be billed when office clinical staff spend 20 minutes or more in a month to coordinate care. 4. Only one practitioner may furnish and bill the service in a calendar month. 5. The patient may stop CCM services at any time (effective at the end of the month) by phone call to the office staff. 6. The patient will be responsible for cost sharing (co-pay) of up to 20% of the service fee (after annual deductible is met).  Patient agreed to services and verbal consent obtained.   Follow up plan: Telephone appointment with care management team member scheduled for:12/19/2019  Glenna Durand, [place], [place] Management ??[email or web address] ??[phone]

## 2019-12-05 ENCOUNTER — Other Ambulatory Visit: Payer: Self-pay | Admitting: Internal Medicine

## 2019-12-17 ENCOUNTER — Other Ambulatory Visit: Payer: Self-pay | Admitting: Internal Medicine

## 2019-12-19 ENCOUNTER — Ambulatory Visit (INDEPENDENT_AMBULATORY_CARE_PROVIDER_SITE_OTHER): Payer: Medicare HMO | Admitting: Pharmacist

## 2019-12-19 DIAGNOSIS — J432 Centrilobular emphysema: Secondary | ICD-10-CM | POA: Diagnosis not present

## 2019-12-19 DIAGNOSIS — IMO0002 Reserved for concepts with insufficient information to code with codable children: Secondary | ICD-10-CM

## 2019-12-19 DIAGNOSIS — E1165 Type 2 diabetes mellitus with hyperglycemia: Secondary | ICD-10-CM | POA: Diagnosis not present

## 2019-12-19 DIAGNOSIS — E118 Type 2 diabetes mellitus with unspecified complications: Secondary | ICD-10-CM | POA: Diagnosis not present

## 2019-12-19 DIAGNOSIS — R69 Illness, unspecified: Secondary | ICD-10-CM | POA: Diagnosis not present

## 2019-12-19 MED ORDER — OZEMPIC (0.25 OR 0.5 MG/DOSE) 2 MG/1.5ML ~~LOC~~ SOPN: 0.5000 mg | PEN_INJECTOR | SUBCUTANEOUS | 0 refills | Status: DC

## 2019-12-19 MED ORDER — METFORMIN HCL 500 MG PO TABS: 500.0000 mg | ORAL_TABLET | Freq: Two times a day (BID) | ORAL | 1 refills | Status: DC

## 2019-12-19 NOTE — Chronic Care Management (AMB) (Addendum)
Chronic Care Management   Note  12/19/2019 Name: Catherine [name] MRN: [id number] DOB: 21-Nov-1955   Subjective:  Catherine [name] is a 64 y.o. year old female who is a primary care patient of [name], Aris Everts, MD. The CCM team was consulted for assistance with chronic disease management and care coordination needs.    Contacted patient for medication management review.  Review of patient status, including review of consultants reports, laboratory and other test data, was performed as part of comprehensive evaluation and provision of chronic care management services.   SDOH (Social Determinants of Health) assessments and interventions performed:  no  Objective:  Lab Results  Component Value Date   CREATININE 0.86 12/10/2018   CREATININE 1.05 03/23/2018   CREATININE 0.90 02/09/2017    Lab Results  Component Value Date   HGBA1C 8.2 (H) 12/10/2018       Component Value Date/Time   CHOL 139 12/10/2018 1447   CHOL 185 11/11/2011 0914   TRIG 177 (H) 12/10/2018 1447   TRIG 166 11/11/2011 0914   HDL 41 (L) 12/10/2018 1447   HDL 47 11/11/2011 0914   CHOLHDL 3.4 12/10/2018 1447   VLDL 37.6 03/23/2018 1640   VLDL 33 11/11/2011 0914   LDLCALC 73 12/10/2018 1447   LDLCALC 105 (H) 11/11/2011 0914   LDLDIRECT 70 12/10/2018 1447    Clinical ASCVD: No  The 10-year ASCVD risk score Mikey Bussing DC Jr., et al., 2013) is: 11.6%   Values used to calculate the score:     Age: 60 years     Sex: Female     Is Non-Hispanic African American: No     Diabetic: Yes     Tobacco smoker: No     Systolic Blood Pressure: 400 mmHg     Is BP treated: No     HDL Cholesterol: 41 mg/dL     Total Cholesterol: 139 mg/dL    BP Readings from Last 3 Encounters:  10/10/19 140/78  09/16/18 (!) 120/58  06/10/18 128/80    Allergies  Allergen Reactions  . Sulfa Antibiotics Swelling    Patient reports caused cellulitis Other reaction(s): Other (See Comments) cellulitis  . Hydroxychloroquine     Other  reaction(s): Other (See Comments) Gi upset  . Oxycodone-Acetaminophen   . Percocet [Oxycodone-Acetaminophen]   . Vicodin [Hydrocodone-Acetaminophen]   . Aspirin Other (See Comments)    Other reaction(s): Blood Disorder  . Ibuprofen     Other reaction(s): Blood Disorder    Medications Reviewed Today    Reviewed by De Hollingshead, [facility] (Pharmacist) on 12/19/19 at [place] List Status: <None>  Medication Order Taking? Sig Documenting Provider Last Dose Status Informant  acetaminophen (TYLENOL) 325 MG tablet [id number] Yes Take 650 mg by mouth every 6 (six) hours as needed. PRN headache [provider] Taking Active   albuterol (VENTOLIN HFA) 108 (90 Base) MCG/ACT inhaler [id number] Yes USE 2 PUFFS EVERY 6 HOURS AS NEEDED FOR WHEEZING Martyn Ehrich, NP Taking Active            Med Note ([place]   Mon Dec 19, 2019  3:13 PM) Using in between doses of Duoneb 3-4 times daily   aspirin 81 MG EC tablet [id number] Yes Take 81 mg by mouth daily.   [provider] Taking Active   Blood Glucose Monitoring Suppl (RELION CONFIRM GLUCOSE MONITOR) w/Device KIT [id number]  1 strip by Does not apply route daily. Check CBG once daily 2 hours after supper [provider]  Active   butalbital-acetaminophen-caffeine (FIORICET) 50-325-40 MG tablet [id number] Yes Take 1 tablet by mouth every 6 (six) hours as needed for headache. Crecencio Mc, MD Taking Active            Med Note Darnelle Maffucci, Waynette Buttery Dec 19, 2019  3:13 PM) Using 2-3 times weekly  Cholecalciferol (VITAMIN D3) 125 MCG (5000 UT) CAPS [id number] Yes Take 1,000 Units by mouth daily.  [provider] Taking Active   docusate sodium (COLACE) 100 MG capsule [id number] Yes Take 200 mg by mouth daily. Reported on 09/20/2015 [provider] Taking Active   glipiZIDE (GLUCOTROL) 5 MG tablet [id number] Yes TAKE 1 TABLET BY MOUTH BEFORE BREAKFAST AND 2 TABLETS BEFORE DINNER Crecencio Mc, MD Taking  Active   Insulin Syringe-Needle U-100 (RELION INSULIN SYRINGE) 31G X 15/64" 0.3 ML MISC [id number] Yes Use 2 syringes daily with isulin Crecencio Mc, MD Taking Active   ipratropium-albuterol (DUONEB) 0.5-2.5 (3) MG/3ML SOLN [id number] Yes USE 1 VIAL VIA NEBULIZER EVERY 6 HOURS AS NEEDED Crecencio Mc, MD Taking Active            Med Note Mayo Ao Dec 19, 2019  3:11 PM) Taking Q4H  Lactobacillus (PROBIOTIC ACIDOPHILUS PO) [id number] Yes Take 1 capsule by mouth daily. [provider] Taking Active   magnesium oxide (MAG-OX) 400 MG tablet [id number] Yes Take 400 mg by mouth every other day. [provider] Taking Active   Melatonin 5 MG TABS [id number] Yes Take 5 mg by mouth at bedtime. [provider] Taking Active   metFORMIN (GLUCOPHAGE) 500 MG tablet [id number] Yes TAKE 1 TABLET BY MOUTH EVERY DAY WITH BREAKFAST  Patient taking differently: daily with supper.    Crecencio Mc, MD Taking Active   NOVOLIN 70/30 RELION (70-30) 100 UNIT/ML injection [id number] Yes INJECT 15 UNITS SUBCUTANEOUSLY BEFORE BREAKFAST AND 20 UNITS BEFORE EVENING MEAL ADJUST WEEKLY PER MD Crecencio Mc, MD Taking Active            Med Note De Hollingshead   Mon Dec 19, 2019  3:14 PM) 18-20 QAM, 25 QPM  OXYGEN [id number]  Inhale into the lungs continuous. 4L continuous [provider]  Active   PARoxetine (PAXIL) 10 MG tablet [id number] Yes TAKE 1 TABLET(10 MG) BY MOUTH EVERY MORNING Crecencio Mc, MD Taking Active   Potassium 99 MG TABS [id number] Yes Take 99 mg by mouth daily. Reported on 09/20/2015 [provider] Taking Active            Med Note ([place], ASHLEY L   Thu Dec 06, 2015 10:41 AM)    promethazine (PHENERGAN) 12.5 MG tablet [id number] Yes TAKE 1 TABLET (12.5 MG TOTAL) BY MOUTH AT BEDTIME AS NEEDED FOR NAUSEA OR VOMITING. Crecencio Mc, MD Taking Active            Med Note Mayo Ao Dec 19, 2019  3:16 PM) Taking 2-3 times  weekly  rosuvastatin (CRESTOR) 40 MG tablet [id number] Yes TAKE 1 TABLET BY MOUTH EVERY DAY Crecencio Mc, MD Taking Active            Assessment:   Goals Addressed            This Visit's Progress     Patient Stated   . PharmD "I can't afford my medications" (pt-stated)       CARE PLAN ENTRY (see longtitudinal plan of care for  additional care plan information)  Current Barriers:  . Polypharmacy; complex patient with multiple comorbidities including COPD (hx tobacco use); CAD, HTN, T2DM . Financial concerns: Patient reports she is unable to afford brand name medications, including inhalers and diabetes medications.  o COPD: Follows w/ [facility]. Saw NP Volanda Napoleon 08/26/2019. Noted preference for triple therapy or LAMA/LABA therapy. Currently only able to afford Duonebs (prescribed Q6H but notes she is taking Q4H) and albuterol HFA PRN (notes taking 3-4 times daily in between nebulizer therapy) o T2DM: last A1c 8.2%. Metformin 500 mg daily (previously reported constipation after 1 dose so she stopped, but now denies having had problems with the medication); Novolin 70/30 18-20 units QAM, 25 mg PM; glipizide 5 mg QAM, 10 mg QPM. Denies hypoglycemia.  o ASCVD risk reduction: rosuvastatin 40 mg daily, last LDL at goal ~70; ASA 81 mg daily   Pharmacist Clinical Goal(s):  Marland Kitchen Over the next 90 days, patient will work with PharmD and provider towards optimized medication management  Interventions: . Comprehensive medication review performed; medication list updated in electronic medical record . Inter-disciplinary care team collaboration (see longitudinal plan of care) . Patient does not qualify for [place] assistance for Anoro/Advair/Ventolin. She would qualify for BI assistance for [employer] or [employer] for [employer]. She will qualify for Merck assistance for Proventil. Collaborated w/ NP Volanda Napoleon - will pursue patient assistance for Breztri through [place] and Proventil through [employer]. Will  collaborate w/ CPhT to mail patient portion of application to patient and fax/mail provider portion to NP [facility].  . Discussed goal to minimize burden of insulin and sulfonylureas to reduce risk of hypoglycemia and weight gain.  . Increase metformin to 500 mg BID.  Marland Kitchen Discussed addition of GLP1. Patient would qualify for assistance for [employer] products (including [place]). To determine tolerability, will provide sample of Ozempic - inject 0.25 mg once weekly for 4 weeks, then increase to 0.5 mg weekly. Will ask her to come into clinic for administration teaching next week, also to allow 1 week for her to determine tolerability of increased metformin dose prior to adding Ozempic.  . Moving forward, will plan to reduce Novolin 70/30 as patient tolerates Ozempic, and eventually switch to better basal insulin with [name], also through [employer].   Patient Self Care Activities:  . Patient will take medications as prescribed  Initial goal documentation        Plan: - Will collaborate w/ interdisciplinary team as above - Scheduled f/u call 01/19/20  Catie Darnelle Maffucci, PharmD, BCACP, [place] Pharmacist [place] [phone]

## 2019-12-19 NOTE — Patient Instructions (Addendum)
Ms. Newbold,   It was great talking to you today! We are working on a couple different things, so please don't hesitate to call with questions:   1) Increase metformin to 500 mg twice daily. I recommend you take this with a meal to help reduce your risk of stomach upset.  2) In a week, start Ozempic 0.25 mg weekly for 4 weeks, then increase to 0.5 mg weekly. The office staff should have called you to schedule a nurse visit for administration teaching.   3) I am going to collaborate w/ the Pulmonary doctor to discuss the best inhaler option to pursue for patient assistance for you. We do have options that you would qualify for patient assistance for, so we will discuss those. Once we get her OK, we'll mail you the pages of the applications we need you to fill out. Be on the look out for mail from "[employer]" - my Pharmacologist, Noreene Larsson, and I work there. We will also need a copy of your 2020 tax return as proof of your income.   Moving forward, we will work on changing you to a better insulin, using less insulin, and hopefully getting rid of the glipizide, all while using medications that you can afford.    Visit Information  Goals Addressed            This Visit's Progress     Patient Stated   . PharmD "I can't afford my medications" (pt-stated)       CARE PLAN ENTRY (see longtitudinal plan of care for additional care plan information)  Current Barriers:  . Polypharmacy; complex patient with multiple comorbidities including COPD (hx tobacco use); CAD, HTN, T2DM . Financial concerns: Patient reports she is unable to afford brand name medications, including inhalers and diabetes medications.  o COPD: Follows w/ [facility]. Saw NP Clent Ridges 08/26/2019. Noted preference for triple therapy or LAMA/LABA therapy. Currently only able to afford Duonebs (prescribed Q6H but notes she is taking Q4H) and albuterol HFA PRN (notes taking 3-4 times daily in between nebulizer  therapy) o T2DM: last A1c 8.2%. Metformin 500 mg daily (previously reported constipation after 1 dose so she stopped, but now denies having had problems with the medication); Novolin 70/30 18-20 units QAM, 25 mg PM; glipizide 5 mg QAM, 10 mg QPM. Denies hypoglycemia.  o ASCVD risk reduction: rosuvastatin 40 mg daily, last LDL at goal ~70; ASA 81 mg daily   Pharmacist Clinical Goal(s):  Marland Kitchen Over the next 90 days, patient will work with PharmD and provider towards optimized medication management  Interventions: . Comprehensive medication review performed; medication list updated in electronic medical record . Inter-disciplinary care team collaboration (see longitudinal plan of care) . Patient does not qualify for [employer] assistance for Anoro/Advair/Ventolin. She would qualify for BI assistance for [employer] or [employer] for [employer]. She will qualify for Merck assistance for Proventil. Collaborated w/ NP Clent Ridges - will pursue patient assistance for Breztri through [place] and Proventil through [employer]. Will collaborate w/ CPhT to mail patient portion of application to patient and fax/mail provider portion to NP [facility].  . Discussed goal to minimize burden of insulin and sulfonylureas to reduce risk of hypoglycemia and weight gain.  . Increase metformin to 500 mg BID.  Marland Kitchen Discussed addition of GLP1. Patient would qualify for assistance for [employer] products (including [place]). To determine tolerability, will provide sample of Ozempic - inject 0.25 mg once weekly for 4 weeks, then increase to 0.5  mg weekly. Will ask her to come into clinic for administration teaching next week, also to allow 1 week for her to determine tolerability of increased metformin dose prior to adding Ozempic.  . Moving forward, will plan to reduce Novolin 70/30 as patient tolerates Ozempic, and eventually switch to better basal insulin with [name], also through [employer].   Patient Self Care Activities:   . Patient will take medications as prescribed  Initial goal documentation        Patient verbalizes understanding of instructions provided today.   Plan: - Will collaborate w/ interdisciplinary team as above - Scheduled f/u call 01/19/20  Catie Darnelle Maffucci, PharmD, BCACP, [place] Pharmacist [place] [phone]

## 2019-12-21 ENCOUNTER — Other Ambulatory Visit: Payer: Self-pay | Admitting: Pharmacy Technician

## 2019-12-21 NOTE — Patient Outreach (Signed)
[facility]) Care Management  12/21/2019  Catherine [name] 06/26/1956 [id number]                                       Medication Assistance Referral  Referral From: [facility] Embedded RPh Catie T.   Medication/Company: Markus [employer]aft / [place] Patient application portion:  Mailed Provider application portion: Faxed  to Ames Dura, NP Provider address/fax verified via: Office website  Medication/Company: Proventil HFA / Merck Patient application portion:  Mining engineer portion: [employer] to Ames Dura, NP Provider address/fax verified via: Office website     Follow up:  Will follow up with patient in 10-15 business days to confirm application(s) have been received.  Marney Treloar P. Harm Jou, CPhT [employer]  [phone]

## 2019-12-24 ENCOUNTER — Other Ambulatory Visit: Payer: Self-pay | Admitting: Primary Care

## 2019-12-25 DIAGNOSIS — G839 Paralytic syndrome, unspecified: Secondary | ICD-10-CM | POA: Diagnosis not present

## 2019-12-25 DIAGNOSIS — J449 Chronic obstructive pulmonary disease, unspecified: Secondary | ICD-10-CM | POA: Diagnosis not present

## 2019-12-26 NOTE — Telephone Encounter (Signed)
atc no vmail set up. Pt needs to establish care with Dr. Jayme Cloud per BW on 08/26/19  Follow Up Instructions:  - FU on 3 months to establish care with Dr. Jayme Cloud

## 2020-01-03 ENCOUNTER — Other Ambulatory Visit: Payer: Self-pay

## 2020-01-03 MED ORDER — BREZTRI AEROSPHERE 160-9-4.8 MCG/ACT IN AERO: 2.0000 | INHALATION_SPRAY | Freq: Two times a day (BID) | RESPIRATORY_TRACT | 0 refills | Status: DC

## 2020-01-04 ENCOUNTER — Other Ambulatory Visit: Payer: Self-pay | Admitting: Pharmacy Technician

## 2020-01-04 NOTE — Patient Outreach (Signed)
[facility]) Care Management  01/04/2020  Catherine [name] 09/25/1955 [id number]    Successful call placed to patient regarding patient assistance application(s) for Breztri with [place] and Proventil HFA with Merck , HIPAA identifiers verified.   Patient informed she has received the applications but has not mailed them back yet. She informs she will try and get them copleted today and placed in mail. Confiremd patient had name and number if any questions.  Follow up:  Will route note to embedded pharamcist [name] for case closure if document(s) have not been received in the next 15 business days.  Catherine [name], CPhT [employer]  [phone]

## 2020-01-07 ENCOUNTER — Emergency Department: Payer: Medicare HMO

## 2020-01-07 ENCOUNTER — Observation Stay
Admission: EM | Admit: 2020-01-07 | Discharge: 2020-01-08 | Disposition: A | Discharge status: Home / Self Care | Payer: Medicare HMO | Attending: Family Medicine | Admitting: Family Medicine

## 2020-01-07 ENCOUNTER — Other Ambulatory Visit: Payer: Self-pay

## 2020-01-07 DIAGNOSIS — Z9981 Dependence on supplemental oxygen: Secondary | ICD-10-CM | POA: Insufficient documentation

## 2020-01-07 DIAGNOSIS — Z886 Allergy status to analgesic agent status: Secondary | ICD-10-CM | POA: Insufficient documentation

## 2020-01-07 DIAGNOSIS — E1165 Type 2 diabetes mellitus with hyperglycemia: Secondary | ICD-10-CM | POA: Insufficient documentation

## 2020-01-07 DIAGNOSIS — K529 Noninfective gastroenteritis and colitis, unspecified: Principal | ICD-10-CM | POA: Diagnosis present

## 2020-01-07 DIAGNOSIS — J432 Centrilobular emphysema: Secondary | ICD-10-CM

## 2020-01-07 DIAGNOSIS — I700 Atherosclerosis of aorta: Secondary | ICD-10-CM | POA: Diagnosis not present

## 2020-01-07 DIAGNOSIS — E785 Hyperlipidemia, unspecified: Secondary | ICD-10-CM | POA: Diagnosis not present

## 2020-01-07 DIAGNOSIS — K5641 Fecal impaction: Secondary | ICD-10-CM | POA: Insufficient documentation

## 2020-01-07 DIAGNOSIS — K760 Fatty (change of) liver, not elsewhere classified: Secondary | ICD-10-CM | POA: Insufficient documentation

## 2020-01-07 DIAGNOSIS — R109 Unspecified abdominal pain: Secondary | ICD-10-CM | POA: Diagnosis not present

## 2020-01-07 DIAGNOSIS — J439 Emphysema, unspecified: Secondary | ICD-10-CM | POA: Diagnosis present

## 2020-01-07 DIAGNOSIS — IMO0002 Reserved for concepts with insufficient information to code with codable children: Secondary | ICD-10-CM

## 2020-01-07 DIAGNOSIS — J449 Chronic obstructive pulmonary disease, unspecified: Secondary | ICD-10-CM | POA: Diagnosis not present

## 2020-01-07 DIAGNOSIS — Z7984 Long term (current) use of oral hypoglycemic drugs: Secondary | ICD-10-CM | POA: Insufficient documentation

## 2020-01-07 DIAGNOSIS — F329 Major depressive disorder, single episode, unspecified: Secondary | ICD-10-CM | POA: Insufficient documentation

## 2020-01-07 DIAGNOSIS — Z882 Allergy status to sulfonamides status: Secondary | ICD-10-CM | POA: Insufficient documentation

## 2020-01-07 DIAGNOSIS — E1169 Type 2 diabetes mellitus with other specified complication: Secondary | ICD-10-CM | POA: Insufficient documentation

## 2020-01-07 DIAGNOSIS — E118 Type 2 diabetes mellitus with unspecified complications: Secondary | ICD-10-CM | POA: Diagnosis not present

## 2020-01-07 DIAGNOSIS — Z20822 Contact with and (suspected) exposure to covid-19: Secondary | ICD-10-CM | POA: Diagnosis not present

## 2020-01-07 DIAGNOSIS — D72829 Elevated white blood cell count, unspecified: Secondary | ICD-10-CM | POA: Insufficient documentation

## 2020-01-07 DIAGNOSIS — E872 Acidosis: Secondary | ICD-10-CM | POA: Insufficient documentation

## 2020-01-07 DIAGNOSIS — J9611 Chronic respiratory failure with hypoxia: Secondary | ICD-10-CM | POA: Diagnosis not present

## 2020-01-07 DIAGNOSIS — A419 Sepsis, unspecified organism: Secondary | ICD-10-CM | POA: Diagnosis not present

## 2020-01-07 DIAGNOSIS — Z885 Allergy status to narcotic agent status: Secondary | ICD-10-CM | POA: Insufficient documentation

## 2020-01-07 DIAGNOSIS — I10 Essential (primary) hypertension: Secondary | ICD-10-CM | POA: Diagnosis not present

## 2020-01-07 DIAGNOSIS — Z79899 Other long term (current) drug therapy: Secondary | ICD-10-CM | POA: Diagnosis not present

## 2020-01-07 DIAGNOSIS — Z6841 Body Mass Index (BMI) 40.0 and over, adult: Secondary | ICD-10-CM | POA: Diagnosis not present

## 2020-01-07 DIAGNOSIS — Z03818 Encounter for observation for suspected exposure to other biological agents ruled out: Secondary | ICD-10-CM | POA: Diagnosis not present

## 2020-01-07 LAB — URINALYSIS, COMPLETE (UACMP) WITH MICROSCOPIC
Bacteria, UA: NONE SEEN
Bilirubin Urine: NEGATIVE
Glucose, UA: 50 mg/dL — AB
Hgb urine dipstick: NEGATIVE
Ketones, ur: 5 mg/dL — AB
Leukocytes,Ua: NEGATIVE
Nitrite: NEGATIVE
Protein, ur: NEGATIVE mg/dL
Specific Gravity, Urine: 1.011 (ref 1.005–1.030)
pH: 6 (ref 5.0–8.0)

## 2020-01-07 LAB — CBC WITH DIFFERENTIAL/PLATELET
Abs Immature Granulocytes: 0.05 10*3/uL (ref 0.00–0.07)
Basophils Absolute: 0.1 10*3/uL (ref 0.0–0.1)
Basophils Relative: 1 %
Eosinophils Absolute: 0.3 10*3/uL (ref 0.0–0.5)
Eosinophils Relative: 2 %
HCT: 42.3 % (ref 36.0–46.0)
Hemoglobin: 14 g/dL (ref 12.0–15.0)
Immature Granulocytes: 0 %
Lymphocytes Relative: 16 %
Lymphs Abs: 2.2 10*3/uL (ref 0.7–4.0)
MCH: 29 pg (ref 26.0–34.0)
MCHC: 33.1 g/dL (ref 30.0–36.0)
MCV: 87.6 fL (ref 80.0–100.0)
Monocytes Absolute: 0.8 10*3/uL (ref 0.1–1.0)
Monocytes Relative: 6 %
Neutro Abs: 9.8 10*3/uL — ABNORMAL HIGH (ref 1.7–7.7)
Neutrophils Relative %: 75 %
Platelets: 302 10*3/uL (ref 150–400)
RBC: 4.83 MIL/uL (ref 3.87–5.11)
RDW: 13 % (ref 11.5–15.5)
WBC: 13.2 10*3/uL — ABNORMAL HIGH (ref 4.0–10.5)
nRBC: 0 % (ref 0.0–0.2)

## 2020-01-07 LAB — COMPREHENSIVE METABOLIC PANEL
ALT: 28 U/L (ref 0–44)
AST: 39 U/L (ref 15–41)
Albumin: 3.8 g/dL (ref 3.5–5.0)
Alkaline Phosphatase: 96 U/L (ref 38–126)
Anion gap: 13 (ref 5–15)
BUN: 11 mg/dL (ref 8–23)
CO2: 25 mmol/L (ref 22–32)
Calcium: 8.8 mg/dL — ABNORMAL LOW (ref 8.9–10.3)
Chloride: 99 mmol/L (ref 98–111)
Creatinine, Ser: 0.8 mg/dL (ref 0.44–1.00)
GFR calc Af Amer: >60 mL/min (ref >60)
GFR calc non Af Amer: >60 mL/min (ref >60)
Glucose, Bld: 196 mg/dL — ABNORMAL HIGH (ref 70–99)
Potassium: 4 mmol/L (ref 3.5–5.1)
Sodium: 137 mmol/L (ref 135–145)
Total Bilirubin: 0.8 mg/dL (ref 0.3–1.2)
Total Protein: 7.6 g/dL (ref 6.5–8.1)

## 2020-01-07 LAB — LACTIC ACID, PLASMA
Lactic Acid, Venous: 3.2 mmol/L (ref 0.5–1.9)
Lactic Acid, Venous: 3.3 mmol/L (ref 0.5–1.9)
Lactic Acid, Venous: 1.7 mmol/L (ref 0.5–1.9)

## 2020-01-07 LAB — GLUCOSE, CAPILLARY
Glucose-Capillary: 138 mg/dL — ABNORMAL HIGH (ref 70–99)
Glucose-Capillary: 188 mg/dL — ABNORMAL HIGH (ref 70–99)

## 2020-01-07 LAB — RESPIRATORY PANEL BY RT PCR (FLU A&B, COVID)
Influenza A by PCR: NEGATIVE
Influenza B by PCR: NEGATIVE
SARS Coronavirus 2 by RT PCR: NEGATIVE

## 2020-01-07 MED ORDER — ACETAMINOPHEN 325 MG PO TABS
650.0000 mg | ORAL_TABLET | Freq: Four times a day (QID) | ORAL | Status: DC | PRN
  Administered 2020-01-07: 650 mg via ORAL
  Filled 2020-01-07: qty 2

## 2020-01-07 MED ORDER — SODIUM CHLORIDE 0.9 % IV SOLN
3.0000 g | Freq: Four times a day (QID) | INTRAVENOUS | Status: DC
  Administered 2020-01-07 – 2020-01-08 (×2): 3 g via INTRAVENOUS
  Filled 2020-01-07 (×3): qty 8
  Filled 2020-01-07: qty 3
  Filled 2020-01-07: qty 8
  Filled 2020-01-07: qty 3

## 2020-01-07 MED ORDER — LACTULOSE 10 GM/15ML PO SOLN
30.0000 g | Freq: Once | ORAL | Status: AC
  Administered 2020-01-07: 30 g via ORAL
  Filled 2020-01-07: qty 60

## 2020-01-07 MED ORDER — IPRATROPIUM-ALBUTEROL 0.5-2.5 (3) MG/3ML IN SOLN
3.0000 mL | Freq: Once | RESPIRATORY_TRACT | Status: AC
  Administered 2020-01-07: 3 mL via RESPIRATORY_TRACT
  Filled 2020-01-07: qty 3

## 2020-01-07 MED ORDER — POLYETHYLENE GLYCOL 3350 17 G PO PACK
17.0000 g | PACK | Freq: Two times a day (BID) | ORAL | Status: DC
  Administered 2020-01-07 – 2020-01-08 (×2): 17 g via ORAL
  Filled 2020-01-07 (×2): qty 1

## 2020-01-07 MED ORDER — BISACODYL 10 MG RE SUPP: 10.0000 mg | Freq: Every day | RECTAL | Status: DC | PRN

## 2020-01-07 MED ORDER — BUDESON-GLYCOPYRROL-FORMOTEROL 160-9-4.8 MCG/ACT IN AERO: 2.0000 | INHALATION_SPRAY | Freq: Two times a day (BID) | RESPIRATORY_TRACT | Status: DC

## 2020-01-07 MED ORDER — SODIUM CHLORIDE 0.9 % IV BOLUS
1000.0000 mL | Freq: Once | INTRAVENOUS | Status: AC
  Administered 2020-01-07: 13:00:00 1000 mL via INTRAVENOUS

## 2020-01-07 MED ORDER — INSULIN ASPART 100 UNIT/ML ~~LOC~~ SOLN
0.0000 [IU] | Freq: Three times a day (TID) | SUBCUTANEOUS | Status: DC
  Administered 2020-01-08: 3 [IU] via SUBCUTANEOUS
  Filled 2020-01-07: qty 1

## 2020-01-07 MED ORDER — IPRATROPIUM-ALBUTEROL 0.5-2.5 (3) MG/3ML IN SOLN
3.0000 mL | Freq: Four times a day (QID) | RESPIRATORY_TRACT | Status: DC
  Administered 2020-01-07 – 2020-01-08 (×2): 3 mL via RESPIRATORY_TRACT
  Filled 2020-01-07 (×2): qty 3

## 2020-01-07 MED ORDER — MORPHINE SULFATE (PF) 4 MG/ML IV SOLN
4.0000 mg | Freq: Once | INTRAVENOUS | Status: AC
  Administered 2020-01-07: 4 mg via INTRAVENOUS
  Filled 2020-01-07: qty 1

## 2020-01-07 MED ORDER — ACETAMINOPHEN 650 MG RE SUPP: 650.0000 mg | Freq: Four times a day (QID) | RECTAL | Status: DC | PRN

## 2020-01-07 MED ORDER — SODIUM CHLORIDE 0.9 % IV SOLN: INTRAVENOUS | Status: DC

## 2020-01-07 MED ORDER — PAROXETINE HCL 10 MG PO TABS
10.0000 mg | ORAL_TABLET | Freq: Every day | ORAL | Status: DC
  Administered 2020-01-08: 09:00:00 10 mg via ORAL
  Filled 2020-01-07: qty 1

## 2020-01-07 MED ORDER — MINERAL OIL RE ENEM
1.0000 | ENEMA | Freq: Once | RECTAL | Status: AC
  Administered 2020-01-07: 1 via RECTAL

## 2020-01-07 MED ORDER — SODIUM CHLORIDE 0.9% FLUSH
3.0000 mL | Freq: Two times a day (BID) | INTRAVENOUS | Status: DC
  Administered 2020-01-08: 09:00:00 3 mL via INTRAVENOUS

## 2020-01-07 MED ORDER — IOHEXOL 300 MG/ML  SOLN
125.0000 mL | Freq: Once | INTRAMUSCULAR | Status: AC | PRN
  Administered 2020-01-07: 12:00:00 125 mL via INTRAVENOUS

## 2020-01-07 MED ORDER — SORBITOL 70 % SOLN
960.0000 mL | TOPICAL_OIL | Freq: Once | ORAL | Status: AC
  Administered 2020-01-07: 960 mL via RECTAL
  Filled 2020-01-07: qty 240

## 2020-01-07 MED ORDER — DIPHENHYDRAMINE HCL 25 MG PO CAPS
25.0000 mg | ORAL_CAPSULE | Freq: Four times a day (QID) | ORAL | Status: DC | PRN
  Administered 2020-01-07: 25 mg via ORAL
  Filled 2020-01-07: qty 1

## 2020-01-07 MED ORDER — SODIUM CHLORIDE 0.9 % IV BOLUS
1000.0000 mL | Freq: Once | INTRAVENOUS | Status: AC
  Administered 2020-01-07: 15:00:00 1000 mL via INTRAVENOUS

## 2020-01-07 MED ORDER — INSULIN ASPART PROT & ASPART (70-30 MIX) 100 UNIT/ML ~~LOC~~ SUSP
10.0000 [IU] | Freq: Two times a day (BID) | SUBCUTANEOUS | Status: DC
  Administered 2020-01-07 – 2020-01-08 (×2): 10 [IU] via SUBCUTANEOUS
  Filled 2020-01-07 (×2): qty 10

## 2020-01-07 MED ORDER — FENTANYL CITRATE (PF) 100 MCG/2ML IJ SOLN
50.0000 ug | Freq: Once | INTRAMUSCULAR | Status: AC
  Administered 2020-01-07: 15:00:00 50 ug via INTRAVENOUS
  Filled 2020-01-07: qty 2

## 2020-01-07 MED ORDER — SODIUM CHLORIDE 0.9 % IV BOLUS
1000.0000 mL | Freq: Once | INTRAVENOUS | Status: AC
  Administered 2020-01-07: 11:00:00 1000 mL via INTRAVENOUS

## 2020-01-07 MED ORDER — LIDOCAINE HCL URETHRAL/MUCOSAL 2 % EX GEL
1.0000 "application " | Freq: Once | CUTANEOUS | Status: AC
  Administered 2020-01-07: 1 via TOPICAL
  Filled 2020-01-07: qty 10

## 2020-01-07 MED ORDER — PIPERACILLIN-TAZOBACTAM 3.375 G IVPB 30 MIN
3.3750 g | Freq: Once | INTRAVENOUS | Status: AC
  Administered 2020-01-07: 11:00:00 3.375 g via INTRAVENOUS
  Filled 2020-01-07: qty 50

## 2020-01-07 MED ORDER — ONDANSETRON HCL 4 MG/2ML IJ SOLN
4.0000 mg | Freq: Once | INTRAMUSCULAR | Status: AC
  Administered 2020-01-07: 11:00:00 4 mg via INTRAVENOUS
  Filled 2020-01-07: qty 2

## 2020-01-07 MED ORDER — PNEUMOCOCCAL VAC POLYVALENT 25 MCG/0.5ML IJ INJ: 0.5000 mL | INJECTION | INTRAMUSCULAR | Status: DC

## 2020-01-07 NOTE — H&P (Signed)
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  Vital stable but noted to have leukocytosis and lactic acidosis.  CT showed rectal wall significantly distended with impacted feces with mild thickening and small amount of fat stranding, possibly representing stercoral colitis.  She was treated with antibiotics, given enemas, seen by surgery and referred for admission.  Patient reports that she normally takes senna and stool softener and has periods of constipation in the past.  She has had instances of impaction but has not required hospitalization before.  She has been seen by GI in the past and had colonoscopy with removal of polyps by Dr. Mechele Collin but has been told in the future no further colonoscopy secondary to her health condition.  Last bowel movement was 4/26, since that time her appetite has been decreased and p.o. intake has decreased, she has continued to have increasing rectal pain, unrelieved, with no specific relieving factors.  Pain is severe.  She said no fever and no other new issues noted.  Chart review: . Colonoscopy 2012 "polyp"  ED Course: Treated with fentanyl, lactulose, mineral oil enema, morphine, Zosyn, IV fluids 3 L, smog enema. Dr. Erma Heritage reported to me that he spoke with Dr. Lady Gary who evaluated the patient and advised enemas and oral medications.  Review of Systems:  Fever, visual changes, sore throat, rash, new muscle aches, chest pain, worsening shortness of breath, bleeding, vomiting  Positive for mild abdominal pain, dry throat, discomfort with urination  PMH . COPD with chronic hypoxic respiratory failure on 4 L . Diabetes  mellitus type 2 . Morbid obesity BMI 48 . Remainder reviewed in Epic  PSH . Abdominal hysterectomy . Remainder reviewed in Epic  Family history includes: . Mother with heart disease with CABG x5 . Remainder reviewed in Epic  Social History No alcohol, no drugs, former smoker  Allergies . Sulfa, hydroxychloroquine, unspecified reactions to Percocet, Vicodin; aspirin and ibuprofen . Remainder reviewed in Epic  Meds include: . Albuterol, glipizide, Metformin, Paxil, Crestor, senna . Remainder reviewed in Epic  Physicial Exam   Vitals:  . Afebrile 98.0, respiratory rate 14, pulse 102, blood pressure 154/74, 96% on 4 L  Constitutional:   . Appears calm but uncomfortable, ill but not toxic Eyes:  . pupils and irises appear normal . Normal lids  ENMT:  . grossly normal hearing  . Lips appear normal Neck:  . neck appears normal . no thyromegaly Respiratory:  . CTA bilaterally, no w/r/r.  . Respiratory effort normal.  Cardiovascular:  . RRR, no m/r/g . No LE extremity edema   Abdomen:  . Obese, soft, nontender, nondistended . No hernias noted Musculoskeletal:  . RUE, LUE, RLE, LLE   o strength and tone normal, no atrophy, no abnormal movements o No tenderness, masses Skin:  . No rashes, lesions, ulcers . palpation of skin: no induration or nodules Neurologic:  . Grossly unremarkable Psychiatric:  . Mental status o Mood, affect appropriate . judgment and insight appear intact   I have personally reviewed following labs and imaging studies  Labs:  . CMP unremarkable.  WBC 13.2, remainder CBC unremarkable. . Lactic acid 3.2, 3.3, 1.7 . Urinalysis negative, blood cultures pending  Imaging studies:  Portable abdomen plain films dilated central left abdominal small bowel loops mild to moderate colonic stool volume  CT abdomen pelvis rectal vault significantly distended with impacted feces, possible stercoral colitis  Medical tests:   EKG independently  reviewed: Sinus rhythm, no acute changes  Significant Hospital Events   . 5/1 admitted for stercoral colitis, impaction, abdominal pain   Consults:  . General surgery per EDP   Procedures:  .   Significant Diagnostic Tests:  CT abdomen pelvis rectal vault significantly distended with impacted feces, possible stercoral colitis   Micro Data:  .    Antimicrobials:  . Unasyn   ASSESSMENT/PLAN  Stercoral colitis with rectal pain, significant stool impaction, abdominal pain, lactic acidosis noted, mild leukocytosis. --Hemodynamics are stable and she is afebrile.  Lactic acid has returned to normal.  Modest leukocytosis noted, significance unclear.  She does not appear septic or to be infected at this point, however given laboratory findings, will proceed with empiric antibiotics. --As per EDPs discussion with Dr. Celine Ahr, will proceed with oral and per rectal bowel regimen --Pain control  Oxygen dependent COPD, chronic hypoxic respiratory failures, home nasal cannula low-dose 4 L --Continue oxygen.  COPD appears stable.  Continue bronchodilators.  Diabetes mellitus type 2 --Hold oral agents.  We will continue insulin 70/30, and sliding scale insulin.  Random blood sugar was 196.  Anion gap 13.  DVT prophylaxis: SCDs Code Status: Full confirmed with patient Family Communication: husband at bedside Consults called: surgery per EDP    Time spent: 9 minutes  Murray Hodgkins, MD  [facility] Direct contact: see [email or web address]  7PM-7AM contact night coverage as below   1. Check the care team in [facility] and look for a) attending/consulting [facility] provider listed and b) the [facility] team listed 2. Log into [email or web address] and use [place]'s universal password to access. If you do not have the password, please contact the hospital operator. 3. Locate the [facility] provider you are looking for under [facility] and page to a number that you can be directly reached. 4. If you still have  difficulty reaching the provider, please page the [facility] (Director on Call) for the Hospitalists listed on amion for assistance.  Severity of Illness: The appropriate patient status for this patient is OBSERVATION. Observation status is judged to be reasonable and necessary in order to provide the required intensity of service to ensure the patient's safety. The patient's presenting symptoms, physical exam findings, and initial radiographic and laboratory data in the context of their medical condition is felt to place them at decreased risk for further clinical deterioration. Furthermore, it is anticipated that the patient will be medically stable for discharge from the hospital within 2 midnights of admission. The following factors support the patient status of observation.   " The patient's presenting symptoms include abdominal pain, constipation. " The physical exam findings include appears uncomfortable, impacted per EDP. " The initial radiographic and laboratory data are notable for leukocytosis, lactic acidosis, CT showed fecal impaction, stercoral colitis.   Status is: Observation  The patient remains OBS appropriate and will d/c before 2 midnights.  Dispo: The patient is from: Home              Anticipated d/c is to: Home              Anticipated d/c date is: 1 day              Patient currently is not medically stable to d/c.  01/07/2020, 4:45 PM   Principal Problem:   Impacted stool in rectum [facility]) Active Problems:   Diabetes mellitus type 2, uncontrolled, with complications (HCC)   COPD (chronic obstructive pulmonary disease) (HCC)   Chronic hypoxemic respiratory failure (HCC)   Colitis   Abdominal pain

## 2020-01-07 NOTE — ED Notes (Signed)
Transported to CT scan

## 2020-01-07 NOTE — Progress Notes (Signed)
Notified bedside nurse of need to draw repeat lactic acid around 2:30 pm to look for trend down.

## 2020-01-07 NOTE — Consult Note (Signed)
Pharmacy Antibiotic Note  Catherine [name] is a 64 y.o. female admitted on 01/07/2020 with Intra-abdominal Infection.  Pharmacy has been consulted for Unasyn dosing.  Patient had Zosyn 3.375 g last on 01/07/20 @ 1119   Plan: Unasyn 3g Q6H- first dose at 2000 today.   Height: 5\' 3"  (160 cm) Weight: 125.2 kg (276 lb) IBW/kg (Calculated) : 52.4  Temp (24hrs), Avg:98 F (36.7 C), Min:98 F (36.7 C), Max:98 F (36.7 C)  Recent Labs  Lab 01/07/20 1024 01/07/20 1228 01/07/20 1441  WBC 13.2*  --   --   CREATININE 0.80  --   --   LATICACIDVEN 3.2* 3.3* 1.7    Estimated Creatinine Clearance: 90.2 mL/min (by C-G formula based on SCr of 0.8 mg/dL).    Allergies  Allergen Reactions  . Sulfa Antibiotics Swelling    Patient reports caused cellulitis Other reaction(s): Other (See Comments) cellulitis  . Hydroxychloroquine     Other reaction(s): Other (See Comments) Gi upset  . Oxycodone-Acetaminophen   . Percocet [Oxycodone-Acetaminophen]   . Vicodin [Hydrocodone-Acetaminophen]   . Aspirin Other (See Comments)    Other reaction(s): Blood Disorder  . Ibuprofen     Other reaction(s): Blood Disorder     Thank you for allowing pharmacy to be a part of this patient's care.  03/08/20 01/07/2020 5:10 PM

## 2020-01-07 NOTE — Plan of Care (Signed)
Continuing with plan of care. 

## 2020-01-07 NOTE — Progress Notes (Signed)
CODE SEPSIS - PHARMACY COMMUNICATION  **Broad Spectrum Antibiotics should be administered within 1 hour of Sepsis diagnosis**  Time Code Sepsis Called/Page Received: 1108  Antibiotics Ordered: Zosyn  Time of 1st antibiotic administration: 1119  Additional action taken by pharmacy: n/a  If necessary, Name of Provider/Nurse Contacted: none    Albina Billet ,PharmD Clinical Pharmacist  01/07/2020  11:06 AM

## 2020-01-07 NOTE — ED Notes (Signed)
Pt with wet brief with smear of stool prior to disempaction.

## 2020-01-07 NOTE — ED Notes (Signed)
Pt changed, wet brief and small smear of stool, marble size. Pharmacy called about SMOG enema. Pharmacy will bring.

## 2020-01-07 NOTE — ED Triage Notes (Signed)
Pt states no BM in a week. States takes stool softeners every day. States took miralax yesterday. Can feel stool at end of rectum but nothing is coming out. States uncomfortable to sit on bottom. Pt is tachypnic, on 4 L [place] chronically.

## 2020-01-07 NOTE — ED Notes (Signed)
Per Dr Erma Heritage, one L of IVF at a time

## 2020-01-07 NOTE — ED Notes (Signed)
This RN with EDP during disempaction. Golfball size amount of medium soft brown stool disempacted from pt's rectum. Larger external hemerrhoids noted. Pt with discomfort during procedure. Pain meds given. Pt calm at present, pain decreased after procedure per pt.

## 2020-01-07 NOTE — ED Notes (Signed)
Pharmacy delivered enema, will send with pt to floor, floor nurse aware.

## 2020-01-07 NOTE — ED Notes (Signed)
Transported to xray 

## 2020-01-07 NOTE — ED Provider Notes (Signed)
[facility] Emergency Department Provider Note  ____________________________________________   First MD Initiated Contact with Patient 01/07/20 [phone]     (approximate)  I have reviewed the triage vital signs and the nursing notes.   HISTORY  Chief Complaint Constipation    HPI Catherine [name] is a 64 y.o. female with past medical history as below here with abdominal pain and constipation.  The patient reports a very long history of chronic constipation.  She takes stool softeners and senna daily.  She states that over the last week, she has had increasing difficulty having a bowel movement.  She states that over the last week, she has felt increasing swelling and pain in her rectal area along with sensation that she needs to go to the bathroom.  Whenever she tries, however, she feels like something gets stuck.  She is been unable to produce any movement since then.  She said nausea but no vomiting.  No fevers or chills.  No ongoing abdominal pain, only rectal pain when she tries to go to the bathroom.  No recent medication changes.  No history of infections in the past.  No history of obstructions.        Past Medical History:  Diagnosis Date  . Anxiety   . Asthma   . COPD (chronic obstructive pulmonary disease) (HCC)   . Depression   . Diabetes mellitus   . Gastritis and duodenitis June 2011   EGD  . GERD (gastroesophageal reflux disease)   . Hyperglycemia   . Hyperlipidemia   . Hypertension   . Leukocytosis   . Obesity (BMI 30-39.9)   . S/P cardiac catheterization March 2012   normal coronaries,  due to chest pain ([place])  . Screening for breast cancer Jul 28 2011   normal  . Screening for colon cancer June 2011   polyps found, next one due 2014 ([place])  . Tobacco abuse, in remission    quit oct during hospitalization     Patient Active Problem List   Diagnosis Date Noted  . Colitis 01/07/2020  . Abdominal pain 01/07/2020  . Impacted  stool in rectum (HCC) 01/07/2020  . Educated about COVID-19 virus infection 02/20/2019  . Microalbuminuria due to type 2 diabetes mellitus (HCC) 03/25/2018  . Bilateral chronic knee pain 12/20/2016  . Nausea in adult 12/20/2016  . Other specified nonscarring hair loss 12/20/2016  . Vitamin D deficiency 04/22/2016  . Tachycardia 11/21/2014  . Encounter for preventive health examination 09/26/2014  . Neck pain of over 3 months duration 09/26/2014  . Inflammatory arthritis 06/13/2014  . Elevated alkaline phosphatase measurement 10/09/2013  . Leg swelling 04/05/2013  . OSA (obstructive sleep apnea) 03/01/2013  . Allergic rhinitis 02/04/2013  . Pulmonary nodule 09/15/2012  . COPD exacerbation (HCC) 05/28/2012  . Chronic hypoxemic respiratory failure (HCC) 05/03/2012  . COPD (chronic obstructive pulmonary disease) (HCC) 04/28/2012  . Diabetes mellitus type 2, uncontrolled, with complications (HCC) 11/07/2011  . GERD (gastroesophageal reflux disease)   . Obesity (BMI 30-39.9)   . Anxiety   . Tobacco abuse, in remission   . Screening for colon cancer   . Screening for breast cancer   . CAD (coronary artery disease) 12/02/2010  . HTN (hypertension) 12/02/2010  . Hyperlipidemia 12/02/2010    Past Surgical History:  Procedure Laterality Date  . ABDOMINAL HYSTERECTOMY    . ABDOMINAL HYSTERECTOMY    . BILATERAL OOPHORECTOMY  1995   and TAH.  noncancerous reasons  . CARDIAC CATHETERIZATION  11/22/2010   no significant disease    Prior to Admission medications   Medication Sig Start Date End Date Taking? Authorizing Provider  acetaminophen (TYLENOL) 325 MG tablet Take 650 mg by mouth every 6 (six) hours as needed. PRN headache   Yes [provider]  albuterol (VENTOLIN HFA) 108 (90 Base) MCG/ACT inhaler USE 2 PUFFS EVERY 6 HOURS AS NEEDED FOR WHEEZING 08/26/19  Yes Glenford Bayley, NP  aspirin 81 MG EC tablet Take 81 mg by mouth daily.     Yes [provider]    Cholecalciferol (VITAMIN D3) 125 MCG (5000 UT) CAPS Take 1,000 Units by mouth daily.    Yes [provider]  docusate sodium (COLACE) 100 MG capsule Take 200 mg by mouth daily. Reported on 09/20/2015   Yes [provider]  glipiZIDE (GLUCOTROL) 5 MG tablet TAKE 1 TABLET BY MOUTH BEFORE BREAKFAST AND 2 TABLETS BEFORE DINNER 07/25/19  Yes Sherlene Shams, MD  ipratropium-albuterol (DUONEB) 0.5-2.5 (3) MG/3ML SOLN USE 1 VIAL VIA NEBULIZER EVERY 6 HOURS AS NEEDED 12/19/19  Yes Sherlene Shams, MD  Lactobacillus (PROBIOTIC ACIDOPHILUS PO) Take 1 capsule by mouth daily.   Yes [provider]  magnesium oxide (MAG-OX) 400 MG tablet Take 400 mg by mouth every other day.   Yes [provider]  Melatonin 5 MG TABS Take 5 mg by mouth at bedtime.   Yes [provider]  metFORMIN (GLUCOPHAGE) 500 MG tablet Take 1 tablet (500 mg total) by mouth 2 (two) times daily with a meal. 12/19/19  Yes Sherlene Shams, MD  PARoxetine (PAXIL) 10 MG tablet TAKE 1 TABLET(10 MG) BY MOUTH EVERY MORNING 09/23/19  Yes Sherlene Shams, MD  Potassium 99 MG TABS Take 99 mg by mouth daily. Reported on 09/20/2015   Yes [provider]  rosuvastatin (CRESTOR) 40 MG tablet TAKE 1 TABLET BY MOUTH EVERY DAY 10/11/19  Yes Sherlene Shams, MD  Budeson-Glycopyrrol-Formoterol (BREZTRI AEROSPHERE) 160-9-4.8 MCG/ACT AERO Inhale 2 puffs into the lungs 2 (two) times daily. 01/03/20   Glenford Bayley, NP  butalbital-acetaminophen-caffeine (FIORICET) [phone] MG tablet Take 1 tablet by mouth every 6 (six) hours as needed for headache. 10/10/19   Sherlene Shams, MD  NOVOLIN 70/30 RELION (70-30) 100 UNIT/ML injection INJECT 15 UNITS SUBCUTANEOUSLY BEFORE BREAKFAST AND 20 UNITS BEFORE EVENING MEAL ADJUST WEEKLY PER MD 08/03/19   Sherlene Shams, MD  promethazine (PHENERGAN) 12.5 MG tablet TAKE 1 TABLET (12.5 MG TOTAL) BY MOUTH AT BEDTIME AS NEEDED FOR NAUSEA OR VOMITING. 09/23/19   Sherlene Shams, MD   Semaglutide,0.25 or 0.5MG /DOS, (OZEMPIC, 0.25 OR 0.5 MG/DOSE,) 2 MG/1.5ML SOPN Inject 0.5 mg into the skin once a week. Inject 0.25 mg once weekly x 4 weeks, then increase to 0.5 mg weekly 12/19/19   Sherlene Shams, MD    Allergies Sulfa antibiotics, Hydroxychloroquine, Oxycodone-acetaminophen, Percocet [oxycodone-acetaminophen], Vicodin [hydrocodone-acetaminophen], Aspirin, and Ibuprofen  Family History  Problem Relation Age of Onset  . Coronary artery disease Mother 46  . Heart disease Mother        CABG x5  . COPD Mother 42  . Breast cancer Mother   . Kidney disease Mother   . Heart failure Father   . Angina Father   . Diabetes Father   . Depression Father   . Stroke Father   . Dementia Father   . Asthma Brother   . Asthma Brother   . Diabetes Brother   . Hypertension Brother   .  Cancer Sister        breast, lung, brain    Social History Social History   Tobacco Use  . Smoking status: Former Smoker    Packs/day: 1.00    Years: 40.00    Pack years: 40.00    Types: Cigarettes    Quit date: 06/27/2011    Years since quitting: 8.5  . Smokeless tobacco: Never Used  Substance Use Topics  . Alcohol use: No  . Drug use: No    Review of Systems  Review of Systems  Constitutional: Positive for fatigue. Negative for fever.  HENT: Negative for congestion and sore throat.   Eyes: Negative for visual disturbance.  Respiratory: Negative for cough and shortness of breath.   Cardiovascular: Negative for chest pain.  Gastrointestinal: Positive for abdominal distention, abdominal pain and nausea. Negative for diarrhea and vomiting.  Genitourinary: Negative for flank pain.  Musculoskeletal: Negative for back pain and neck pain.  Skin: Negative for rash and wound.  Neurological: Negative for weakness.  All other systems reviewed and are negative.    ____________________________________________  PHYSICAL EXAM:      VITAL SIGNS: ED Triage Vitals  Enc Vitals Group      BP 01/07/20 0925 (!) 147/104     Pulse Rate 01/07/20 0925 (!) 114     Resp 01/07/20 0925 (!) 22     Temp 01/07/20 0925 98 F (36.7 C)     Temp Source 01/07/20 0925 Oral     SpO2 01/07/20 0925 96 %     Weight 01/07/20 0926 276 lb (125.2 kg)     Height 01/07/20 0926 5\' 3"  (1.6 m)     Head Circumference --      Peak Flow --      Pain Score 01/07/20 0926 10     Pain Loc --      Pain Edu? --      Excl. in [place]? --      Physical Exam Vitals and nursing note reviewed.  Constitutional:      General: She is not in acute distress.    Appearance: She is well-developed.  HENT:     Head: Normocephalic and atraumatic.  Eyes:     Conjunctiva/sclera: Conjunctivae normal.  Cardiovascular:     Rate and Rhythm: Normal rate and regular rhythm.     Heart sounds: Normal heart sounds. No murmur. No friction rub.  Pulmonary:     Effort: Pulmonary effort is normal. No respiratory distress.     Breath sounds: Wheezing (At baseline) present. No rales.  Abdominal:     General: There is distension.     Palpations: Abdomen is soft.     Tenderness: There is abdominal tenderness.  Musculoskeletal:     Cervical back: Neck supple.  Skin:    General: Skin is warm.     Capillary Refill: Capillary refill takes less than 2 seconds.  Neurological:     Mental Status: She is alert and oriented to person, place, and time.     Motor: No abnormal muscle tone.       ____________________________________________   LABS (all labs ordered are listed, but only abnormal results are displayed)  Labs Reviewed  CBC WITH DIFFERENTIAL/PLATELET - Abnormal; Notable for the following components:      Result Value   WBC 13.2 (*)    Neutro Abs 9.8 (*)    All other components within normal limits  COMPREHENSIVE METABOLIC PANEL - Abnormal; Notable for the following components:  Glucose, Bld 196 (*)    Calcium 8.8 (*)    All other components within normal limits  URINALYSIS, COMPLETE (UACMP) WITH MICROSCOPIC -  Abnormal; Notable for the following components:   Color, Urine YELLOW (*)    APPearance CLEAR (*)    Glucose, UA 50 (*)    Ketones, ur 5 (*)    All other components within normal limits  LACTIC ACID, PLASMA - Abnormal; Notable for the following components:   Lactic Acid, Venous 3.2 (*)    All other components within normal limits  LACTIC ACID, PLASMA - Abnormal; Notable for the following components:   Lactic Acid, Venous 3.3 (*)    All other components within normal limits  GLUCOSE, CAPILLARY - Abnormal; Notable for the following components:   Glucose-Capillary 138 (*)    All other components within normal limits  RESPIRATORY PANEL BY RT PCR (FLU A&B, COVID)  CULTURE, BLOOD (ROUTINE X 2)  CULTURE, BLOOD (ROUTINE X 2)  URINE CULTURE  LACTIC ACID, PLASMA  HEMOGLOBIN A1C  HIV ANTIBODY (ROUTINE TESTING W REFLEX)  BASIC METABOLIC PANEL  CBC    ____________________________________________  EKG: Normal sinus rhythm, VR 87. QRS 95, QTc 436. Non-specific TW changes, no acute ST elevations or depressions. ________________________________________  RADIOLOGY All imaging, including plain films, CT scans, and ultrasounds, independently reviewed by me, and interpretations confirmed via formal radiology reads.  ED MD interpretation:   AAS: Dilated bowel loops, consider ileus or mid to distal SBO CT A/P: Fecal impaction with stercoral colitis, without perforation  Official radiology report(s): CT ABDOMEN PELVIS W CONTRAST  Result Date: 01/07/2020 CLINICAL DATA:  Constipation. Abdominal pain. EXAM: CT ABDOMEN AND PELVIS WITH CONTRAST TECHNIQUE: Multidetector CT imaging of the abdomen and pelvis was performed using the standard protocol following bolus administration of intravenous contrast. CONTRAST:  OMNIPAQUE IOHEXOL 300 MG/ML  SOLN COMPARISON:  CT abdomen pelvis 02/09/2017 FINDINGS: Lower chest: There are a few small pulmonary nodules in the left lung base measuring up to 0.8 cm, which  appear decreased compared to prior chest CT from 05/28/2018 Hepatobiliary: Diffuse fatty infiltration of the liver. No focal liver lesion. The gallbladder is unremarkable in appearance. No biliary dilatation. The Pancreas: Unremarkable. No pancreatic ductal dilatation or surrounding inflammatory changes. Spleen: Normal in size without focal abnormality. Adrenals/Urinary Tract: Adrenal glands are unremarkable. Kidneys are normal, without renal calculi, focal lesion, or hydronephrosis. Bladder is unremarkable. Stomach/Bowel: Stomach is within normal limits. Appendix appears normal. Scattered colonic diverticula without evidence of diverticulitis. The rectal vault is significantly distended with impacted feces. There is mild rectal wall thickening and small amount of fat stranding. The remainder of the bowel loops are normal in caliber without evidence of obstruction. Vascular/Lymphatic: Mild atherosclerotic calcification of the abdominal aorta without evidence of aneurysm. No lymphadenopathy. Reproductive: No adnexal masses. Other: No abdominal wall hernia or abnormality. No abdominopelvic ascites. Musculoskeletal: No acute or significant osseous findings. IMPRESSION: 1. The rectal vault is significantly distended with impacted feces, with mild rectal wall thickening and small amount of fat stranding. Findings may represent stercoral colitis. 2. Hepatic steatosis. 3. Scattered diverticula without evidence of diverticulitis. Aortic Atherosclerosis (ICD10-I70.0). Electronically Signed   By: Emmaline Kluver M.D.   On: 01/07/2020 13:34   DG Abd Portable 2 Views  Result Date: 01/07/2020 CLINICAL DATA:  Abdominal pain, no bowel movement for 1 week EXAM: PORTABLE ABDOMEN - 2 VIEW COMPARISON:  02/09/2017 CT abdomen/pelvis FINDINGS: Mildly dilated central left abdominal small bowel loops up to 3.1 cm diameter.  Mild-to-moderate colonic stool volume. No evidence of pneumatosis or pneumoperitoneum. No radiopaque  nephrolithiasis. Clear lung bases. IMPRESSION: Mildly dilated central left abdominal small bowel loops, cannot exclude mild ileus or partial mid to distal small-bowel obstruction. Mild-to-moderate colonic stool volume. Further evaluation CT abdomen/pelvis with oral and IV contrast may be obtained as clinically warranted. Electronically Signed   By: Delbert PhenixJason A Poff M.D.   On: 01/07/2020 11:40    ____________________________________________  PROCEDURES   Procedure(s) performed (including Critical Care):  .Critical Care Performed by: Shaune PollackIsaacs, Shuntay Everetts, MD Authorized by: Shaune PollackIsaacs, Azaya Goedde, MD   Critical care provider statement:    Critical care time (minutes):  35   Critical care time was exclusive of:  Separately billable procedures and treating other patients and teaching time   Critical care was necessary to treat or prevent imminent or life-threatening deterioration of the following conditions:  Cardiac failure, circulatory failure and respiratory failure   Critical care was time spent personally by me on the following activities:  Development of treatment plan with patient or surrogate, discussions with consultants, evaluation of patient's response to treatment, examination of patient, obtaining history from patient or surrogate, ordering and performing treatments and interventions, ordering and review of laboratory studies, ordering and review of radiographic studies, pulse oximetry, re-evaluation of patient's condition and review of old charts   I assumed direction of critical care for this patient from another provider in my specialty: no   .1-3 Lead EKG Interpretation Performed by: Shaune PollackIsaacs, Metztli Sachdev, MD Authorized by: Shaune PollackIsaacs, Skylen Danielsen, MD     Interpretation: normal     ECG rate:  90-110   ECG rate assessment: tachycardic     Rhythm: sinus rhythm     Ectopy: none     Conduction: normal   Comments:     Indication: Sepsis, abdominal pain Fecal disimpaction  Date/Time: 01/07/2020 6:43  PM Performed by: Shaune PollackIsaacs, Adamae Ricklefs, MD Authorized by: Shaune PollackIsaacs, Ferron Ishmael, MD  Consent: Verbal consent obtained. Risks and benefits: risks, benefits and alternatives were discussed Consent given by: patient Patient understanding: patient states understanding of the procedure being performed Patient consent: the patient's understanding of the procedure matches consent given Procedure consent: procedure consent matches procedure scheduled Relevant documents: relevant documents present and verified Test results: test results available and properly labeled Site marked: the operative site was marked Imaging studies: imaging studies available Required items: required blood products, implants, devices, and special equipment available Patient identity confirmed: arm band Time out: Immediately prior to procedure a "time out" was called to verify the correct patient, procedure, equipment, support staff and site/side marked as required. Local anesthesia used: no  Anesthesia: Local anesthesia used: no  Sedation: Patient sedated: no  Patient tolerance: patient tolerated the procedure well with no immediate complications     ____________________________________________  INITIAL IMPRESSION / MDM / ASSESSMENT AND PLAN / ED COURSE  As part of my medical decision making, I reviewed the following data within the electronic MEDICAL RECORD NUMBER Nursing notes reviewed and incorporated, Old chart reviewed, Notes from prior ED visits, and [place] Controlled Substance Database       *Elizbeth SquiresDeborah Wussow was evaluated in Emergency Department on 01/07/2020 for the symptoms described in the history of present illness. She was evaluated in the context of the global COVID-19 pandemic, which necessitated consideration that the patient might be at risk for infection with the SARS-CoV-2 virus that causes COVID-19. Institutional protocols and algorithms that pertain to the evaluation of patients at risk for COVID-19 are in a  state of rapid  change based on information released by regulatory bodies including the CDC and federal and state organizations. These policies and algorithms were followed during the patient's care in the ED.  Some ED evaluations and interventions may be delayed as a result of limited staffing during the pandemic.*  Clinical Course as of Jan 06 1845  Sat Jan 07, 2020  1793 64 year old female here with abdominal pain and constipation.  She has a history of chronic constipation and clinically appears to be impacted.  There was a significant amount of hard stool in the rectal vault with circumferential, swollen internal and external hemorrhoids.  Suspect there is some component of obstruction here.  Following attempt at manual disimpaction, will give analgesia, lidocaine, mineral oil, and reassess.  Awaiting for this, given her age and degree of tenderness, will obtain additional imaging.   [CI]    Clinical Course User Index [CI] Shaune Pollack, MD    Medical Decision Making:  CT, labs obtained and show moderate leukocytosis, lactic acidosis concerning for intra-abd pathology. IVF, Zosyn started with cautious fluids given her significantly elevated BMI and resp status. Cultures sent. CT scan obtained and shows stercoral colitis. Discussed with Dr. Lady Gary of General Surgery. Pt disimpacted as above without bleeding or perforation. Will admit to medicine for bowel regimen - OK for enema and PO meds, and monitor.   ____________________________________________  FINAL CLINICAL IMPRESSION(S) / ED DIAGNOSES  Final diagnoses:  Colitis  Sepsis without acute organ dysfunction, due to unspecified organism (HCC)     MEDICATIONS GIVEN DURING THIS VISIT:  Medications  PARoxetine (PAXIL) tablet 10 mg (has no administration in time range)  insulin aspart protamine- aspart (NOVOLOG MIX 70/30) injection 10 Units (10 Units Subcutaneous Given 01/07/20 1827)  ipratropium-albuterol (DUONEB) 0.5-2.5 (3) MG/3ML  nebulizer solution 3 mL (has no administration in time range)  insulin aspart (novoLOG) injection 0-15 Units (has no administration in time range)  sodium chloride flush (NS) 0.9 % injection 3 mL (has no administration in time range)  0.9 %  sodium chloride infusion ( Intravenous New Bag/Given 01/07/20 1803)  acetaminophen (TYLENOL) tablet 650 mg (has no administration in time range)    Or  acetaminophen (TYLENOL) suppository 650 mg (has no administration in time range)  polyethylene glycol (MIRALAX / GLYCOLAX) packet 17 g (has no administration in time range)  bisacodyl (DULCOLAX) suppository 10 mg (has no administration in time range)  diphenhydrAMINE (BENADRYL) capsule 25 mg (25 mg Oral Given 01/07/20 1758)  Ampicillin-Sulbactam (UNASYN) 3 g in sodium chloride 0.9 % 100 mL IVPB (has no administration in time range)  pneumococcal 23 valent vaccine (PNEUMOVAX-23) injection 0.5 mL (has no administration in time range)  morphine 4 MG/ML injection 4 mg (4 mg Intravenous Given 01/07/20 1049)  ondansetron (ZOFRAN) injection 4 mg (4 mg Intravenous Given 01/07/20 1049)  lidocaine (XYLOCAINE) 2 % jelly 1 application (1 application Topical Given 01/07/20 1050)  mineral oil enema 1 enema (1 enema Rectal Given 01/07/20 1119)  piperacillin-tazobactam (ZOSYN) IVPB 3.375 g (0 g Intravenous Stopped 01/07/20 1157)  sodium chloride 0.9 % bolus 1,000 mL (0 mLs Intravenous Stopped 01/07/20 1157)  iohexol (OMNIPAQUE) 300 MG/ML solution 125 mL (125 mLs Intravenous Contrast Given 01/07/20 1207)  morphine 4 MG/ML injection 4 mg (4 mg Intravenous Given 01/07/20 1228)  sodium chloride 0.9 % bolus 1,000 mL (0 mLs Intravenous Stopped 01/07/20 1641)  sodium chloride 0.9 % bolus 1,000 mL (0 mLs Intravenous Stopped 01/07/20 1501)  fentaNYL (SUBLIMAZE) injection 50 mcg (50 mcg Intravenous Given 01/07/20  1452)  ipratropium-albuterol (DUONEB) 0.5-2.5 (3) MG/3ML nebulizer solution 3 mL (3 mLs Nebulization Given 01/07/20 1543)  sorbitol, milk of mag,  mineral oil, glycerin (SMOG) enema (960 mLs Rectal Given 01/07/20 1828)  lactulose (CHRONULAC) 10 GM/15ML solution 30 g (30 g Oral Given 01/07/20 1541)  mineral oil enema 1 enema (1 enema Rectal Given 01/07/20 1827)     ED Discharge Orders    None       Note:  This document was prepared using Dragon voice recognition software and may include unintentional dictation errors.   Shaune Pollack, MD 01/07/20 [phone]

## 2020-01-07 NOTE — ED Notes (Signed)
Date and time results received: 01/07/20 1300 (use smartphrase ".now" to insert current time)  Test: Lactic  Critical Value: 3.3  Name of Provider Notified: Isaacs  Orders Received? Or Actions Taken?: Orders Received - See Orders for details

## 2020-01-08 DIAGNOSIS — E1165 Type 2 diabetes mellitus with hyperglycemia: Secondary | ICD-10-CM | POA: Diagnosis not present

## 2020-01-08 DIAGNOSIS — E118 Type 2 diabetes mellitus with unspecified complications: Secondary | ICD-10-CM | POA: Diagnosis not present

## 2020-01-08 DIAGNOSIS — J432 Centrilobular emphysema: Secondary | ICD-10-CM | POA: Diagnosis not present

## 2020-01-08 DIAGNOSIS — R109 Unspecified abdominal pain: Secondary | ICD-10-CM | POA: Diagnosis not present

## 2020-01-08 DIAGNOSIS — R69 Illness, unspecified: Secondary | ICD-10-CM | POA: Diagnosis not present

## 2020-01-08 DIAGNOSIS — K529 Noninfective gastroenteritis and colitis, unspecified: Secondary | ICD-10-CM | POA: Diagnosis not present

## 2020-01-08 DIAGNOSIS — K5641 Fecal impaction: Secondary | ICD-10-CM | POA: Diagnosis not present

## 2020-01-08 DIAGNOSIS — J9611 Chronic respiratory failure with hypoxia: Secondary | ICD-10-CM | POA: Diagnosis not present

## 2020-01-08 LAB — CBC
HCT: 38 % (ref 36.0–46.0)
Hemoglobin: 12.2 g/dL (ref 12.0–15.0)
MCH: 28.8 pg (ref 26.0–34.0)
MCHC: 32.1 g/dL (ref 30.0–36.0)
MCV: 89.6 fL (ref 80.0–100.0)
Platelets: 267 10*3/uL (ref 150–400)
RBC: 4.24 MIL/uL (ref 3.87–5.11)
RDW: 13.2 % (ref 11.5–15.5)
WBC: 12.8 10*3/uL — ABNORMAL HIGH (ref 4.0–10.5)
nRBC: 0 % (ref 0.0–0.2)

## 2020-01-08 LAB — BASIC METABOLIC PANEL
Anion gap: 5 (ref 5–15)
BUN: 9 mg/dL (ref 8–23)
CO2: 31 mmol/L (ref 22–32)
Calcium: 8.1 mg/dL — ABNORMAL LOW (ref 8.9–10.3)
Chloride: 105 mmol/L (ref 98–111)
Creatinine, Ser: 0.72 mg/dL (ref 0.44–1.00)
GFR calc Af Amer: >60 mL/min (ref >60)
GFR calc non Af Amer: >60 mL/min (ref >60)
Glucose, Bld: 173 mg/dL — ABNORMAL HIGH (ref 70–99)
Potassium: 4.2 mmol/L (ref 3.5–5.1)
Sodium: 141 mmol/L (ref 135–145)

## 2020-01-08 LAB — URINE CULTURE
Culture: NO GROWTH
Special Requests: NORMAL

## 2020-01-08 LAB — GLUCOSE, CAPILLARY: Glucose-Capillary: 154 mg/dL — ABNORMAL HIGH (ref 70–99)

## 2020-01-08 LAB — HIV ANTIBODY (ROUTINE TESTING W REFLEX): HIV Screen 4th Generation wRfx: NONREACTIVE

## 2020-01-08 LAB — HEMOGLOBIN A1C
Hgb A1c MFr Bld: 8.2 % — ABNORMAL HIGH (ref 4.8–5.6)
Mean Plasma Glucose: 188.64 mg/dL

## 2020-01-08 MED ORDER — IPRATROPIUM-ALBUTEROL 0.5-2.5 (3) MG/3ML IN SOLN: 3.0000 mL | Freq: Three times a day (TID) | RESPIRATORY_TRACT | Status: DC

## 2020-01-08 MED ORDER — IPRATROPIUM-ALBUTEROL 0.5-2.5 (3) MG/3ML IN SOLN
3.0000 mL | Freq: Once | RESPIRATORY_TRACT | Status: AC
  Administered 2020-01-08: 3 mL via RESPIRATORY_TRACT
  Filled 2020-01-08: qty 3

## 2020-01-08 MED ORDER — POLYETHYLENE GLYCOL 3350 17 G PO PACK: 17.0000 g | PACK | Freq: Two times a day (BID) | ORAL | Status: DC

## 2020-01-08 NOTE — Discharge Summary (Signed)
Physician Discharge Summary  Catherine [name] WVP:[id number] DOB: 1956/04/12 DOA: 01/07/2020  PCP: Sherlene Shams, MD  Admit date: 01/07/2020 Discharge date: 01/08/2020  Recommendations for Outpatient Follow-up:  1. Routine care, attention to bowel regimen  Follow-up Information    Sherlene Shams, MD Follow up.   Specialty: Internal Medicine Why: as needed Contact information: [address] [place] [address] [phone]            Discharge Diagnoses: Principal diagnosis is #1 1. Stercoral colitis with rectal pain, significant stool impaction, abdominal pain 2. Oxygen dependent COPD 3. Chronic hypoxic respiratory failure 4. Diabetes mellitus type 2  Discharge Condition: improved Disposition: home  Diet recommendation: diabetic diet  Filed Weights   01/07/20 0926  Weight: 125.2 kg    History of present illness:  64 year old woman PMH including oxygen dependent COPD on 4 L, diabetes mellitus type 2, morbid obesity, constipation, presented to the emergency department with rectal pain and severe constipation with last bowel movement 4/26.  Vital stable but noted to have leukocytosis and lactic acidosis.  CT showed rectal wall significantly distended with impacted feces with mild thickening and small amount of fat stranding, possibly representing stercoral colitis.  She was treated with antibiotics, given enemas, seen by surgery and referred for admission.  Hospital Course:  Patient treated with aggressive bowel regimen with resolution of pain and constipation with large bowel movement.  Hospitalization overnight unremarkable, afebrile, no indication for further antibiotics.  Discussed changed bowel regimen at home with patient, MiraLAX, continue senna, stop Colace.  Can use over-the-counter suppository as needed.:  At least every other day bowel movement.  If MiraLAX is ineffective, see PCP for discussion of alternative agents.  Patient expressed  understanding.  Oxygen dependent COPD and diabetes remained stable.  Today's assessment: S: Feels much better today.  Pain has resolved.  Very large bowel movement last night.  Breathing fine.  Ready to go home. O: Vitals:  Vitals:   01/08/20 0415 01/08/20 0750  BP: (!) 163/77   Pulse: (!) 106 95  Resp: 20 17  Temp: 98.2 F (36.8 C)   SpO2: 97% 95%    Constitutional:  . Appears calm and comfortable Respiratory:  . CTA bilaterally, no w/r/r.  . Respiratory effort normal.  Cardiovascular:  . RRR, no m/r/g Abdomen:  . Soft Psychiatric:  . judgement and insight appear normal . Mental status o Mood, affect appropriate  Basic metabolic panel unremarkable CBC unremarkable other than leukocytosis which is improved, down to 12.8.  Discharge Instructions  Discharge Instructions    Activity as tolerated - No restrictions   Complete by: As directed    Diet Carb Modified   Complete by: As directed    Discharge instructions   Complete by: As directed    Call your physician or seek immediate medical attention for abdominal pain, constipation, no bowel movement after 3 days, swelling, fever or worsening of condition.     Allergies as of 01/08/2020      Reactions   Sulfa Antibiotics Swelling   Patient reports caused cellulitis Other reaction(s): Other (See Comments) cellulitis   Hydroxychloroquine    Other reaction(s): Other (See Comments) Gi upset   Oxycodone-acetaminophen    Percocet [oxycodone-acetaminophen]    Vicodin [hydrocodone-acetaminophen]    Aspirin Other (See Comments)   Other reaction(s): Blood Disorder   Ibuprofen    Other reaction(s): Blood Disorder      Medication List    STOP taking these medications   Breztri  Aerosphere 160-9-4.8 MCG/ACT Aero Generic drug: Budeson-Glycopyrrol-Formoterol   docusate sodium 100 MG capsule Commonly known as: COLACE     TAKE these medications   acetaminophen 325 MG tablet Commonly known as: TYLENOL Take 650 mg  by mouth every 6 (six) hours as needed. PRN headache   albuterol 108 (90 Base) MCG/ACT inhaler Commonly known as: VENTOLIN HFA USE 2 PUFFS EVERY 6 HOURS AS NEEDED FOR WHEEZING   aspirin 81 MG EC tablet Take 81 mg by mouth daily.   butalbital-acetaminophen-caffeine 50-325-40 MG tablet Commonly known as: FIORICET Take 1 tablet by mouth every 6 (six) hours as needed for headache.   glipiZIDE 5 MG tablet Commonly known as: GLUCOTROL TAKE 1 TABLET BY MOUTH BEFORE BREAKFAST AND 2 TABLETS BEFORE DINNER   ipratropium-albuterol 0.5-2.5 (3) MG/3ML Soln Commonly known as: DUONEB USE 1 VIAL VIA NEBULIZER EVERY 6 HOURS AS NEEDED   magnesium oxide 400 MG tablet Commonly known as: MAG-OX Take 400 mg by mouth every other day.   melatonin 5 MG Tabs Take 5 mg by mouth at bedtime.   metFORMIN 500 MG tablet Commonly known as: GLUCOPHAGE Take 1 tablet (500 mg total) by mouth 2 (two) times daily with a meal.   NovoLIN 70/30 ReliOn (70-30) 100 UNIT/ML injection Generic drug: insulin NPH-regular Human INJECT 15 UNITS SUBCUTANEOUSLY BEFORE BREAKFAST AND 20 UNITS BEFORE EVENING MEAL ADJUST WEEKLY PER MD   Ozempic (0.25 or 0.5 MG/DOSE) 2 MG/1.5ML Sopn Generic drug: Semaglutide(0.25 or 0.5MG /DOS) Inject 0.5 mg into the skin once a week. Inject 0.25 mg once weekly x 4 weeks, then increase to 0.5 mg weekly   PARoxetine 10 MG tablet Commonly known as: PAXIL TAKE 1 TABLET(10 MG) BY MOUTH EVERY MORNING   polyethylene glycol 17 g packet Commonly known as: MIRALAX / GLYCOLAX Take 17 g by mouth 2 (two) times daily.   Potassium 99 MG Tabs Take 99 mg by mouth daily. Reported on 09/20/2015   PROBIOTIC ACIDOPHILUS PO Take 1 capsule by mouth daily.   promethazine 12.5 MG tablet Commonly known as: PHENERGAN TAKE 1 TABLET (12.5 MG TOTAL) BY MOUTH AT BEDTIME AS NEEDED FOR NAUSEA OR VOMITING.   rosuvastatin 40 MG tablet Commonly known as: CRESTOR TAKE 1 TABLET BY MOUTH EVERY DAY   Vitamin D3 125 MCG  (5000 UT) Caps Take 1,000 Units by mouth daily.      Allergies  Allergen Reactions  . Sulfa Antibiotics Swelling    Patient reports caused cellulitis Other reaction(s): Other (See Comments) cellulitis  . Hydroxychloroquine     Other reaction(s): Other (See Comments) Gi upset  . Oxycodone-Acetaminophen   . Percocet [Oxycodone-Acetaminophen]   . Vicodin [Hydrocodone-Acetaminophen]   . Aspirin Other (See Comments)    Other reaction(s): Blood Disorder  . Ibuprofen     Other reaction(s): Blood Disorder    The results of significant diagnostics from this hospitalization (including imaging, microbiology, ancillary and laboratory) are listed below for reference.    Significant Diagnostic Studies: CT ABDOMEN PELVIS W CONTRAST  Result Date: 01/07/2020 CLINICAL DATA:  Constipation. Abdominal pain. EXAM: CT ABDOMEN AND PELVIS WITH CONTRAST TECHNIQUE: Multidetector CT imaging of the abdomen and pelvis was performed using the standard protocol following bolus administration of intravenous contrast. CONTRAST:  OMNIPAQUE IOHEXOL 300 MG/ML  SOLN COMPARISON:  CT abdomen pelvis 02/09/2017 FINDINGS: Lower chest: There are a few small pulmonary nodules in the left lung base measuring up to 0.8 cm, which appear decreased compared to prior chest CT from 05/28/2018 Hepatobiliary: Diffuse fatty infiltration  of the liver. No focal liver lesion. The gallbladder is unremarkable in appearance. No biliary dilatation. The Pancreas: Unremarkable. No pancreatic ductal dilatation or surrounding inflammatory changes. Spleen: Normal in size without focal abnormality. Adrenals/Urinary Tract: Adrenal glands are unremarkable. Kidneys are normal, without renal calculi, focal lesion, or hydronephrosis. Bladder is unremarkable. Stomach/Bowel: Stomach is within normal limits. Appendix appears normal. Scattered colonic diverticula without evidence of diverticulitis. The rectal vault is significantly distended with impacted  feces. There is mild rectal wall thickening and small amount of fat stranding. The remainder of the bowel loops are normal in caliber without evidence of obstruction. Vascular/Lymphatic: Mild atherosclerotic calcification of the abdominal aorta without evidence of aneurysm. No lymphadenopathy. Reproductive: No adnexal masses. Other: No abdominal wall hernia or abnormality. No abdominopelvic ascites. Musculoskeletal: No acute or significant osseous findings. IMPRESSION: 1. The rectal vault is significantly distended with impacted feces, with mild rectal wall thickening and small amount of fat stranding. Findings may represent stercoral colitis. 2. Hepatic steatosis. 3. Scattered diverticula without evidence of diverticulitis. Aortic Atherosclerosis (ICD10-I70.0). Electronically Signed   By: Audie Pinto M.D.   On: 01/07/2020 13:34   DG Abd Portable 2 Views  Result Date: 01/07/2020 CLINICAL DATA:  Abdominal pain, no bowel movement for 1 week EXAM: PORTABLE ABDOMEN - 2 VIEW COMPARISON:  02/09/2017 CT abdomen/pelvis FINDINGS: Mildly dilated central left abdominal small bowel loops up to 3.1 cm diameter. Mild-to-moderate colonic stool volume. No evidence of pneumatosis or pneumoperitoneum. No radiopaque nephrolithiasis. Clear lung bases. IMPRESSION: Mildly dilated central left abdominal small bowel loops, cannot exclude mild ileus or partial mid to distal small-bowel obstruction. Mild-to-moderate colonic stool volume. Further evaluation CT abdomen/pelvis with oral and IV contrast may be obtained as clinically warranted. Electronically Signed   By: Ilona Sorrel M.D.   On: 01/07/2020 11:40    Microbiology: Recent Results (from the past 240 hour(s))  Blood culture (routine x 2)     Status: None (Preliminary result)   Collection Time: 01/07/20 11:15 AM   Specimen: BLOOD  Result Value Ref Range Status   Specimen Description BLOOD L H  Final   Special Requests   Final    BOTTLES DRAWN AEROBIC AND ANAEROBIC  Blood Culture adequate volume   Culture   Final    NO GROWTH < 24 HOURS Performed at [facility], [address], [place], [place] [address]    Report Status PENDING  Incomplete  Blood culture (routine x 2)     Status: None (Preliminary result)   Collection Time: 01/07/20 11:16 AM   Specimen: BLOOD  Result Value Ref Range Status   Specimen Description BLOOD R FA  Final   Special Requests   Final    BOTTLES DRAWN AEROBIC AND ANAEROBIC Blood Culture adequate volume   Culture   Final    NO GROWTH < 24 HOURS Performed at [facility], [address], [place], [place] [address]    Report Status PENDING  Incomplete  Respiratory Panel by RT PCR (Flu A&B, Covid) - Nasopharyngeal Swab     Status: None   Collection Time: 01/07/20  4:44 PM   Specimen: Nasopharyngeal Swab  Result Value Ref Range Status   SARS Coronavirus 2 by RT PCR NEGATIVE NEGATIVE Final    Comment: (NOTE) SARS-CoV-2 target nucleic acids are NOT DETECTED. The SARS-CoV-2 RNA is generally detectable in upper respiratoy specimens during the acute phase of infection. The lowest concentration of SARS-CoV-2 viral copies this assay can detect is 131 copies/mL. A negative  result does not preclude SARS-Cov-2 infection and should not be used as the sole basis for treatment or other patient management decisions. A negative result may occur with  improper specimen collection/handling, submission of specimen other than nasopharyngeal swab, presence of viral mutation(s) within the areas targeted by this assay, and inadequate number of viral copies (<131 copies/mL). A negative result must be combined with clinical observations, patient history, and epidemiological information. The expected result is Negative. Fact Sheet for Patients:  https://www.moore.com/ Fact Sheet for Healthcare Providers:  https://www.young.biz/ This test is not yet ap proved or cleared by the [place] FDA and  has been authorized for detection and/or diagnosis of SARS-CoV-2 by FDA under an Emergency Use Authorization (EUA). This EUA will remain  in effect (meaning this test can be used) for the duration of the COVID-19 declaration under Section 564(b)(1) of the Act, 21 U.S.C. section 360bbb-3(b)(1), unless the authorization is terminated or revoked sooner.    Influenza A by PCR NEGATIVE NEGATIVE Final   Influenza B by PCR NEGATIVE NEGATIVE Final    Comment: (NOTE) The Xpert Xpress SARS-CoV-2/FLU/RSV assay is intended as an aid in  the diagnosis of influenza from Nasopharyngeal swab specimens and  should not be used as a sole basis for treatment. Nasal washings and  aspirates are unacceptable for Xpert Xpress SARS-CoV-2/FLU/RSV  testing. Fact Sheet for Patients: https://www.moore.com/ Fact Sheet for Healthcare Providers: https://www.young.biz/ This test is not yet approved or cleared by the [place] FDA and  has been authorized for detection and/or diagnosis of SARS-CoV-2 by  FDA under an Emergency Use Authorization (EUA). This EUA will remain  in effect (meaning this test can be used) for the duration of the  Covid-19 declaration under Section 564(b)(1) of the Act, 21  U.S.C. section 360bbb-3(b)(1), unless the authorization is  terminated or revoked. Performed at [facility], [address] [place], [place] [address]      Labs: Basic Metabolic Panel: Recent Labs  Lab 01/07/20 1024 01/08/20 0632  NA 137 141  K 4.0 4.2  CL 99 105  CO2 25 31  GLUCOSE 196* 173*  BUN 11 9  CREATININE 0.80 0.72  CALCIUM 8.8* 8.1*   Liver Function Tests: Recent Labs  Lab 01/07/20 1024  AST 39  ALT 28  ALKPHOS 96  BILITOT 0.8  PROT 7.6  ALBUMIN 3.8  CBC: Recent Labs  Lab 01/07/20 1024 01/08/20 0632  WBC 13.2* 12.8*  NEUTROABS 9.8*  --   HGB 14.0 12.2  HCT 42.3 38.0  MCV 87.6 89.6  PLT 302 267    CBG: Recent  Labs  Lab 01/07/20 1733 01/07/20 2109  GLUCAP 138* 188*    Principal Problem:   Impacted stool in rectum [facility]) Active Problems:   Diabetes mellitus type 2, uncontrolled, with complications (HCC)   COPD (chronic obstructive pulmonary disease) (HCC)   Chronic hypoxemic respiratory failure (HCC)   Colitis   Abdominal pain   Time coordinating discharge: 20 minutes  Signed:  Brendia Sacks, MD  [facility]  01/08/2020, 9:54 AM

## 2020-01-08 NOTE — Plan of Care (Signed)
Discharge teaching completed with patient who verbalized understanding of teaching.  Patient is in stable condition and has all belongings. 

## 2020-01-09 ENCOUNTER — Telehealth: Payer: Self-pay

## 2020-01-09 NOTE — Telephone Encounter (Signed)
Transition Care Management Follow-up Telephone Call  Date of discharge and from where: 01/08/20 from [facility]  How have you been since you were released from the hospital? Appetite has not improved; eating mostly crackers/nabs. Nausea and lightheaded upon waking, better now. Denies emesis, ABD pain, swelling, or worsening of condition. Drinking more water, less soda.   Any questions or concerns? None at this time.   Items Reviewed:  Did the pt receive and understand the discharge instructions provided? Monitor diet. Take miralax BID.    Medications obtained and verified? Yes, no issues. Taking all scheduled medications as directed.   Any new allergies since your discharge? No.  Dietary orders reviewed? Yes, diabetic/carb modified diet.  Do you have support at home? Yes, husband and 2 sons.   Functional Questionnaire: (I = Independent and D = Dependent) ADLs: I  Bathing/Dressing- I  Meal Prep- I  Eating- I  Maintaining continence- I  Transferring/Ambulation- I  Managing Meds- I  Follow up appointments reviewed:   PCP Hospital f/u appt confirmed? Scheduled to see Dr. Darrick Huntsman on 01/20/20 @ 4:00.  Are transportation arrangements needed? No  If their condition worsens, is the pt aware to call PCP or go to the Emergency Dept.? Yes  Was the patient provided with contact information for the PCP's office or ED? Yes  Was to pt encouraged to call back with questions or concerns? Yes

## 2020-01-11 ENCOUNTER — Telehealth: Payer: Self-pay

## 2020-01-11 MED ORDER — LACTULOSE 20 GM/30ML PO SOLN: ORAL | 3 refills | Status: DC

## 2020-01-11 MED ORDER — FLUCONAZOLE 150 MG PO TABS
150.0000 mg | ORAL_TABLET | Freq: Every day | ORAL | 0 refills | Status: DC
Start: 2020-01-11 — End: 2020-01-19

## 2020-01-11 NOTE — Telephone Encounter (Signed)
Patient made aware.

## 2020-01-11 NOTE — Telephone Encounter (Signed)
Patient called stating she has had one small/minimal bowel movement since discharge and now has a headache. Taking tylenol for headache and Miralax BID with good water intake. Eating without issue. Denies emesis and all other symptoms. Hospital follow up rescheduled for tomorrow at 11:00. TCM on chart. Encouraged patient to go to urgent care today if symptoms worsen or she feels the need to be seen earlier than tomorrow.

## 2020-01-11 NOTE — Telephone Encounter (Signed)
Patient notified to pick up lactulose. States she has had one small BM since earlier conversation and feels better. Nausea is relieved. Took fioricet as directed for persistent headache and is relieved. Requests diflucan sent to local pharmacy for yeast infection. Encouraged to keep hfu as scheduled 5/6.

## 2020-01-11 NOTE — Telephone Encounter (Signed)
I sent rx for lactulose to pharmacy for relief of constipation

## 2020-01-12 ENCOUNTER — Other Ambulatory Visit: Payer: Self-pay

## 2020-01-12 ENCOUNTER — Encounter: Payer: Self-pay | Admitting: Internal Medicine

## 2020-01-12 ENCOUNTER — Ambulatory Visit (INDEPENDENT_AMBULATORY_CARE_PROVIDER_SITE_OTHER): Payer: Medicare HMO

## 2020-01-12 ENCOUNTER — Ambulatory Visit (INDEPENDENT_AMBULATORY_CARE_PROVIDER_SITE_OTHER): Payer: Medicare HMO | Admitting: Internal Medicine

## 2020-01-12 VITALS — BP 182/98 | HR 93 | Temp 97.4°F | Resp 17 | Ht 63.0 in | Wt 275.6 lb

## 2020-01-12 DIAGNOSIS — E1165 Type 2 diabetes mellitus with hyperglycemia: Secondary | ICD-10-CM | POA: Diagnosis not present

## 2020-01-12 DIAGNOSIS — E118 Type 2 diabetes mellitus with unspecified complications: Secondary | ICD-10-CM | POA: Diagnosis not present

## 2020-01-12 DIAGNOSIS — K5901 Slow transit constipation: Secondary | ICD-10-CM

## 2020-01-12 DIAGNOSIS — K5900 Constipation, unspecified: Secondary | ICD-10-CM | POA: Diagnosis not present

## 2020-01-12 DIAGNOSIS — Z09 Encounter for follow-up examination after completed treatment for conditions other than malignant neoplasm: Secondary | ICD-10-CM

## 2020-01-12 DIAGNOSIS — I10 Essential (primary) hypertension: Secondary | ICD-10-CM | POA: Diagnosis not present

## 2020-01-12 DIAGNOSIS — IMO0002 Reserved for concepts with insufficient information to code with codable children: Secondary | ICD-10-CM

## 2020-01-12 DIAGNOSIS — K5641 Fecal impaction: Secondary | ICD-10-CM | POA: Diagnosis not present

## 2020-01-12 LAB — COMPREHENSIVE METABOLIC PANEL
ALT: 25 U/L (ref 0–35)
AST: 36 U/L (ref 0–37)
Albumin: 3.7 g/dL (ref 3.5–5.2)
Alkaline Phosphatase: 95 U/L (ref 39–117)
BUN: 9 mg/dL (ref 6–23)
CO2: 34 mEq/L — ABNORMAL HIGH (ref 19–32)
Calcium: 8.9 mg/dL (ref 8.4–10.5)
Chloride: 101 mEq/L (ref 96–112)
Creatinine, Ser: 0.72 mg/dL (ref 0.40–1.20)
GFR: 81.18 mL/min (ref >60.00)
Glucose, Bld: 190 mg/dL — ABNORMAL HIGH (ref 70–99)
Potassium: 4.1 mEq/L (ref 3.5–5.1)
Sodium: 139 mEq/L (ref 135–145)
Total Bilirubin: 0.4 mg/dL (ref 0.2–1.2)
Total Protein: 6.7 g/dL (ref 6.0–8.3)

## 2020-01-12 LAB — CULTURE, BLOOD (ROUTINE X 2)
Culture: NO GROWTH
Culture: NO GROWTH
Special Requests: ADEQUATE
Special Requests: ADEQUATE

## 2020-01-12 LAB — TSH: TSH: 1.1 u[IU]/mL (ref 0.35–4.50)

## 2020-01-12 LAB — MAGNESIUM: Magnesium: 1.8 mg/dL (ref 1.5–2.5)

## 2020-01-12 MED ORDER — HYDRALAZINE HCL 25 MG PO TABS
25.0000 mg | ORAL_TABLET | Freq: Three times a day (TID) | ORAL | 0 refills | Status: DC
Start: 2020-01-12 — End: 2020-01-26

## 2020-01-12 MED ORDER — ONDANSETRON 4 MG PO TBDP
4.0000 mg | ORAL_TABLET | Freq: Three times a day (TID) | ORAL | 0 refills | Status: DC | PRN
Start: 2020-01-12 — End: 2021-10-23

## 2020-01-12 NOTE — Progress Notes (Addendum)
Subjective:  Patient ID: Catherine [name], female    DOB: [birth year]  Age: 64 y.o. MRN: [id number]  CC: The primary encounter diagnosis was Slow transit constipation. Diagnoses of Diabetes mellitus type 2, uncontrolled, with complications (HCC), Obstipation, Hospital discharge follow-up, and Essential hypertension were also pertinent to this visit.  HPI Catherine [name] presents for hospital follow up   Admitted to [facility] on April 30  with fecal impaction.  Had not had a BM in one week,  Despite adding fiber ,  Then stimulant laxative  . Er doc manually disimpacted,  But she was  incompletely evacuated,  Admitted for management.  Given multiple enemas and lactulose.  Complicated by hemorrhoids. Discharged on May 1   History leading up event:  Decreased water intake,  Lots of soda  Now drinking at least 32 ounces daily,  Bowels moved yesterday and today.  Nausea was better yesterday than today and headache,  Using butalbital and phenergran for nausea   Elevated blood pressure:  No prior history ,  Elevations noted during admission.  No meds started at discharge.    Morning sugars have  been 129 and 150  Nighttime   Below 180  Using 15 to 20 units in am and 20 to 25 at night   Lab Results  Component Value Date   HGBA1C 8.2 (H) 01/08/2020     Outpatient Medications Prior to Visit  Medication Sig Dispense Refill  . acetaminophen (TYLENOL) 325 MG tablet Take 650 mg by mouth every 6 (six) hours as needed. PRN headache    . albuterol (VENTOLIN HFA) 108 (90 Base) MCG/ACT inhaler USE 2 PUFFS EVERY 6 HOURS AS NEEDED FOR WHEEZING 18 g 3  . aspirin 81 MG EC tablet Take 81 mg by mouth daily.      . butalbital-acetaminophen-caffeine (FIORICET) 50-325-40 MG tablet Take 1 tablet by mouth every 6 (six) hours as needed for headache. 30 tablet 5  . Cholecalciferol (VITAMIN D3) 125 MCG (5000 UT) CAPS Take 1,000 Units by mouth daily.     . fluconazole (DIFLUCAN) 150 MG tablet Take 1 tablet (150 mg total)  by mouth daily. 2 tablet 0  . glipiZIDE (GLUCOTROL) 5 MG tablet TAKE 1 TABLET BY MOUTH BEFORE BREAKFAST AND 2 TABLETS BEFORE DINNER 270 tablet 2  . ipratropium-albuterol (DUONEB) 0.5-2.5 (3) MG/3ML SOLN USE 1 VIAL VIA NEBULIZER EVERY 6 HOURS AS NEEDED 270 mL 2  . Lactobacillus (PROBIOTIC ACIDOPHILUS PO) Take 1 capsule by mouth daily.    . Lactulose 20 GM/30ML SOLN 30 ml every 4 hours until constipation is relieved 236 mL 3  . magnesium oxide (MAG-OX) 400 MG tablet Take 400 mg by mouth every other day.    . Melatonin 5 MG TABS Take 5 mg by mouth at bedtime.    . metFORMIN (GLUCOPHAGE) 500 MG tablet Take 1 tablet (500 mg total) by mouth 2 (two) times daily with a meal. 180 tablet 1  . NOVOLIN 70/30 RELION (70-30) 100 UNIT/ML injection INJECT 15 UNITS SUBCUTANEOUSLY BEFORE BREAKFAST AND 20 UNITS BEFORE EVENING MEAL ADJUST WEEKLY PER MD 10 mL 0  . PARoxetine (PAXIL) 10 MG tablet TAKE 1 TABLET(10 MG) BY MOUTH EVERY MORNING 90 tablet 1  . polyethylene glycol (MIRALAX / GLYCOLAX) 17 g packet Take 17 g by mouth 2 (two) times daily.    . Potassium 99 MG TABS Take 99 mg by mouth daily. Reported on 09/20/2015    . promethazine (PHENERGAN) 12.5 MG tablet TAKE 1 TABLET (12.5 MG TOTAL)  BY MOUTH AT BEDTIME AS NEEDED FOR NAUSEA OR VOMITING. 30 tablet 0  . rosuvastatin (CRESTOR) 40 MG tablet TAKE 1 TABLET BY MOUTH EVERY DAY 90 tablet 3  . Semaglutide,0.25 or 0.5MG /DOS, (OZEMPIC, 0.25 OR 0.5 MG/DOSE,) 2 MG/1.5ML SOPN Inject 0.5 mg into the skin once a week. Inject 0.25 mg once weekly x 4 weeks, then increase to 0.5 mg weekly 1 pen 0   No facility-administered medications prior to visit.    Review of Systems;  Patient denies headache, fevers, malaise, unintentional weight loss, skin rash, eye pain, sinus congestion and sinus pain, sore throat, dysphagia,  hemoptysis , cough, dyspnea, wheezing, chest pain, palpitations, orthopnea, edema, abdominal pain, melena, diarrhea,  flank pain, dysuria, hematuria, urinary   Frequency, nocturia, numbness, tingling, seizures,  Focal weakness, Loss of consciousness,  Tremor, insomnia, depression, anxiety, and suicidal ideation.      Objective:  BP (!) 182/98 (BP Location: Left Arm, Patient Position: Sitting, Cuff Size: Large)   Pulse 93   Temp (!) 97.4 F (36.3 C) (Temporal)   Resp 17   Ht 5\' 3"  (1.6 m)   Wt 275 lb 9.6 oz (125 kg)   SpO2 96%   BMI 48.82 kg/m   BP Readings from Last 3 Encounters:  01/16/20 (!) 167/79  01/12/20 (!) 182/98  01/08/20 (!) 163/77    Wt Readings from Last 3 Encounters:  01/16/20 275 lb (124.7 kg)  01/12/20 275 lb 9.6 oz (125 kg)  01/07/20 276 lb (125.2 kg)    General appearance: alert, cooperative and appears stated age Ears: normal TM's and external ear canals both ears Throat: lips, mucosa, and tongue normal; teeth and gums normal Neck: no adenopathy, no carotid bruit, supple, symmetrical, trachea midline and thyroid not enlarged, symmetric, no tenderness/mass/nodules Back: symmetric, no curvature. ROM normal. No CVA tenderness. Lungs: clear to auscultation bilaterally Heart: regular rate and rhythm, S1, S2 normal, no murmur, click, rub or gallop Abdomen: soft, non-tender; bowel sounds normal; no masses,  no organomegaly Pulses: 2+ and symmetric Skin: Skin color, texture, turgor normal. No rashes or lesions Lymph nodes: Cervical, supraclavicular, and axillary nodes normal.  Lab Results  Component Value Date   HGBA1C 8.2 (H) 01/08/2020   HGBA1C 8.2 (H) 12/10/2018   HGBA1C 8.8 (H) 03/23/2018    Lab Results  Component Value Date   CREATININE 0.63 01/16/2020   CREATININE 0.72 01/12/2020   CREATININE 0.72 01/08/2020    Lab Results  Component Value Date   WBC 9.9 01/16/2020   HGB 13.5 01/16/2020   HCT 40.7 01/16/2020   PLT 316 01/16/2020   GLUCOSE 196 (H) 01/16/2020   CHOL 139 12/10/2018   TRIG 177 (H) 12/10/2018   HDL 41 (L) 12/10/2018   LDLDIRECT 70 12/10/2018   LDLCALC 73 12/10/2018   ALT 25  01/12/2020   AST 36 01/12/2020   NA 138 01/16/2020   K 4.1 01/16/2020   CL 101 01/16/2020   CREATININE 0.63 01/16/2020   BUN 12 01/16/2020   CO2 29 01/16/2020   TSH 1.10 01/12/2020   INR 0.9 07/22/2012   HGBA1C 8.2 (H) 01/08/2020   MICROALBUR 4.5 (H) 03/23/2018    CT ABDOMEN PELVIS W CONTRAST  Result Date: 01/07/2020 CLINICAL DATA:  Constipation. Abdominal pain. EXAM: CT ABDOMEN AND PELVIS WITH CONTRAST TECHNIQUE: Multidetector CT imaging of the abdomen and pelvis was performed using the standard protocol following bolus administration of intravenous contrast. CONTRAST:  03/08/2020 OMNIPAQUE IOHEXOL 300 MG/ML  SOLN COMPARISON:  CT abdomen pelvis 02/09/2017  FINDINGS: Lower chest: There are a few small pulmonary nodules in the left lung base measuring up to 0.8 cm, which appear decreased compared to prior chest CT from 05/28/2018 Hepatobiliary: Diffuse fatty infiltration of the liver. No focal liver lesion. The gallbladder is unremarkable in appearance. No biliary dilatation. The Pancreas: Unremarkable. No pancreatic ductal dilatation or surrounding inflammatory changes. Spleen: Normal in size without focal abnormality. Adrenals/Urinary Tract: Adrenal glands are unremarkable. Kidneys are normal, without renal calculi, focal lesion, or hydronephrosis. Bladder is unremarkable. Stomach/Bowel: Stomach is within normal limits. Appendix appears normal. Scattered colonic diverticula without evidence of diverticulitis. The rectal vault is significantly distended with impacted feces. There is mild rectal wall thickening and small amount of fat stranding. The remainder of the bowel loops are normal in caliber without evidence of obstruction. Vascular/Lymphatic: Mild atherosclerotic calcification of the abdominal aorta without evidence of aneurysm. No lymphadenopathy. Reproductive: No adnexal masses. Other: No abdominal wall hernia or abnormality. No abdominopelvic ascites. Musculoskeletal: No acute or significant  osseous findings. IMPRESSION: 1. The rectal vault is significantly distended with impacted feces, with mild rectal wall thickening and small amount of fat stranding. Findings may represent stercoral colitis. 2. Hepatic steatosis. 3. Scattered diverticula without evidence of diverticulitis. Aortic Atherosclerosis (ICD10-I70.0). Electronically Signed   By: Emmaline Kluver M.D.   On: 01/07/2020 13:34   DG Abd Portable 2 Views  Result Date: 01/07/2020 CLINICAL DATA:  Abdominal pain, no bowel movement for 1 week EXAM: PORTABLE ABDOMEN - 2 VIEW COMPARISON:  02/09/2017 CT abdomen/pelvis FINDINGS: Mildly dilated central left abdominal small bowel loops up to 3.1 cm diameter. Mild-to-moderate colonic stool volume. No evidence of pneumatosis or pneumoperitoneum. No radiopaque nephrolithiasis. Clear lung bases. IMPRESSION: Mildly dilated central left abdominal small bowel loops, cannot exclude mild ileus or partial mid to distal small-bowel obstruction. Mild-to-moderate colonic stool volume. Further evaluation CT abdomen/pelvis with oral and IV contrast may be obtained as clinically warranted. Electronically Signed   By: Delbert Phenix M.D.   On: 01/07/2020 11:40    Assessment & Plan:   Problem List Items Addressed This Visit      Unprioritized   Diabetes mellitus type 2, uncontrolled, with complications (HCC)    Advised to stop metformin due to persistent nausea.  Advised to continue evening insulin in smaller amounts rather than suspending completely      Hospital discharge follow-up    Patient is stable post discharge and has no new issues or questions about discharge plans at the visit today for hospital follow up. All labs , imaging studies and progress notes from admission were reviewed with patient today        HTN (hypertension)    Avoiding beta blockers given advanced COPD. Starting hydralazine 25 mg tid.   Lab Results  Component Value Date   MICROALBUR 4.5 (H) 03/23/2018   MICROALBUR <0.2  08/08/2016           Relevant Medications   hydrALAZINE (APRESOLINE) 25 MG tablet   Obstipation    Resulting in fecal impaction .  Continue lactulose, adding Golytely        Other Visit Diagnoses    Slow transit constipation    -  Primary   Relevant Orders   TSH (Completed)   Magnesium (Completed)   Comprehensive metabolic panel (Completed)   DG Abd 1 View (Completed)      I am having Catherine [name] start on ondansetron and hydrALAZINE. I am also having her maintain her aspirin, Potassium, glipiZIDE, NovoLIN 70/30 ReliOn,  Vitamin D3, albuterol, melatonin, Lactobacillus (PROBIOTIC ACIDOPHILUS PO), magnesium oxide, PARoxetine, promethazine, butalbital-acetaminophen-caffeine, rosuvastatin, ipratropium-albuterol, acetaminophen, metFORMIN, Ozempic (0.25 or 0.5 MG/DOSE), polyethylene glycol, Lactulose, and fluconazole.  Meds ordered this encounter  Medications  . ondansetron (ZOFRAN ODT) 4 MG disintegrating tablet    Sig: Take 1 tablet (4 mg total) by mouth every 8 (eight) hours as needed for nausea or vomiting.    Dispense:  20 tablet    Refill:  0  . hydrALAZINE (APRESOLINE) 25 MG tablet    Sig: Take 1 tablet (25 mg total) by mouth 3 (three) times daily.    Dispense:  90 tablet    Refill:  0    There are no discontinued medications.  Follow-up: No follow-ups on file.   Crecencio Mc, MD

## 2020-01-12 NOTE — Patient Instructions (Signed)
Take a dose of lactulose when you get home ,  Along with hydralazine to get your BP donw  zofran for daytime nausea  Goal with water is 48 ounces minimum daily  If there is still a lot of stool on x rays,  I'll call in the bowel prep to get you "cleaned out"   Suspend metformin if nausea not resolved in 48 hours  Don't skip insulin , take at least 5 units rather than skip

## 2020-01-14 DIAGNOSIS — Z09 Encounter for follow-up examination after completed treatment for conditions other than malignant neoplasm: Secondary | ICD-10-CM | POA: Insufficient documentation

## 2020-01-14 DIAGNOSIS — K5900 Constipation, unspecified: Secondary | ICD-10-CM | POA: Insufficient documentation

## 2020-01-14 NOTE — Assessment & Plan Note (Signed)
Patient is stable post discharge and has no new issues or questions about discharge plans at the visit today for hospital follow up. All labs , imaging studies and progress notes from admission were reviewed with patient today   

## 2020-01-14 NOTE — Assessment & Plan Note (Signed)
Advised to stop metformin due to persistent nausea.  Advised to continue evening insulin in smaller amounts rather than suspending completely

## 2020-01-14 NOTE — Assessment & Plan Note (Signed)
Resulting in fecal impaction .  Continue lactulose, adding Golytely

## 2020-01-16 ENCOUNTER — Telehealth: Payer: Self-pay | Admitting: Internal Medicine

## 2020-01-16 ENCOUNTER — Other Ambulatory Visit: Payer: Self-pay

## 2020-01-16 ENCOUNTER — Emergency Department
Admission: EM | Admit: 2020-01-16 | Discharge: 2020-01-16 | Disposition: A | Discharge status: Home / Self Care | Payer: Medicare HMO | Attending: Emergency Medicine | Admitting: Emergency Medicine

## 2020-01-16 DIAGNOSIS — R519 Headache, unspecified: Secondary | ICD-10-CM | POA: Insufficient documentation

## 2020-01-16 DIAGNOSIS — Z9981 Dependence on supplemental oxygen: Secondary | ICD-10-CM | POA: Diagnosis not present

## 2020-01-16 DIAGNOSIS — I10 Essential (primary) hypertension: Secondary | ICD-10-CM | POA: Diagnosis present

## 2020-01-16 DIAGNOSIS — Z5321 Procedure and treatment not carried out due to patient leaving prior to being seen by health care provider: Secondary | ICD-10-CM | POA: Diagnosis not present

## 2020-01-16 DIAGNOSIS — J449 Chronic obstructive pulmonary disease, unspecified: Secondary | ICD-10-CM | POA: Insufficient documentation

## 2020-01-16 LAB — CBC
HCT: 40.7 % (ref 36.0–46.0)
Hemoglobin: 13.5 g/dL (ref 12.0–15.0)
MCH: 29.2 pg (ref 26.0–34.0)
MCHC: 33.2 g/dL (ref 30.0–36.0)
MCV: 88.1 fL (ref 80.0–100.0)
Platelets: 316 10*3/uL (ref 150–400)
RBC: 4.62 MIL/uL (ref 3.87–5.11)
RDW: 13.8 % (ref 11.5–15.5)
WBC: 9.9 10*3/uL (ref 4.0–10.5)
nRBC: 0 % (ref 0.0–0.2)

## 2020-01-16 LAB — BASIC METABOLIC PANEL
Anion gap: 8 (ref 5–15)
BUN: 12 mg/dL (ref 8–23)
CO2: 29 mmol/L (ref 22–32)
Calcium: 8.9 mg/dL (ref 8.9–10.3)
Chloride: 101 mmol/L (ref 98–111)
Creatinine, Ser: 0.63 mg/dL (ref 0.44–1.00)
GFR calc Af Amer: >60 mL/min (ref >60)
GFR calc non Af Amer: >60 mL/min (ref >60)
Glucose, Bld: 196 mg/dL — ABNORMAL HIGH (ref 70–99)
Potassium: 4.1 mmol/L (ref 3.5–5.1)
Sodium: 138 mmol/L (ref 135–145)

## 2020-01-16 NOTE — ED Triage Notes (Addendum)
Pt has a headache and hypertension for several days. Pt started blood pressures meds last week.  Pt states blood pressure is still elevated.no chest pain or sob.    Pt alert  Speech clear. Pt on 4 liters oxygen .  Hx copd.

## 2020-01-16 NOTE — Telephone Encounter (Signed)
Pt was seen by Dr. Darrick Huntsman last week. Dr.[name] added another bp medication. Patient's readings; 5/7-198/117 in evening, 5/8-5am 175/108, 4:30 pm 111/91, 11pm 188/102, 5/9- 12pm 130/88, 10:42pm-235/145, 228/182 15 mins later. 5/10- 237/138, 235/127 later, 3:30pm 153/100, 4:30-148/80.

## 2020-01-16 NOTE — Telephone Encounter (Signed)
Spoken to patient, she stated she is having a very bad headache for the past couple days. Ever since she got out of the hospital her BP has been very elevated per patient. PCP added another BP medication last week. She stated it goes down but it just comes back up. Patient does not have blurry vision, chest pain, SOB, arm px, jaw pain, no slurred speech, and no droop on face. She stated she has very bad head pain and high BP that's all. Instructed patient to go to ED. Patient stated she will go after [place] kids are picked up from her house.

## 2020-01-17 ENCOUNTER — Other Ambulatory Visit: Payer: Self-pay | Admitting: Internal Medicine

## 2020-01-17 NOTE — Telephone Encounter (Signed)
Spoke with pt to let her know that she needs to continue taking the 25 mg dose TID. Pt gave a verbal understanding.

## 2020-01-17 NOTE — Telephone Encounter (Signed)
Called pt to let her know that she should increase her medication to 50 mg Tid. Pt stated that ever since she left the hospital yesterday her blood pressure has been good and her headache has gone. Pt stated that yesterday when she got home from the hospital it was 137/82, this morning was 135/78 and this afternoon was 127/65. Does pt still need to increase her hydralazine to 50 mg TID.

## 2020-01-17 NOTE — Telephone Encounter (Signed)
NO.  CONTINUE 25 MG DOSE

## 2020-01-17 NOTE — Telephone Encounter (Signed)
Patient was treated in ER yesterday  and sent home without BP meds.    I started her on a low dose of hydralazine last week at hospital follow up,  The  Dose  needs to be increased to 50 mg three times daily.   If she doesn't think she can continue this.  medication I can change her to losartan starting at 50 mg dose.  She will need to have a urinalysis and BMEt one week after sarting the losartan, however.

## 2020-01-17 NOTE — Assessment & Plan Note (Signed)
Avoiding beta blockers given advanced COPD. Starting hydralazine 25 mg tid.   Lab Results  Component Value Date   MICROALBUR 4.5 (H) 03/23/2018   MICROALBUR <0.2 08/08/2016

## 2020-01-19 ENCOUNTER — Emergency Department: Payer: Medicare HMO

## 2020-01-19 ENCOUNTER — Telehealth: Payer: Medicare HMO

## 2020-01-19 ENCOUNTER — Other Ambulatory Visit: Payer: Self-pay

## 2020-01-19 ENCOUNTER — Encounter: Payer: Self-pay | Admitting: *Deleted

## 2020-01-19 ENCOUNTER — Observation Stay (HOSPITAL_BASED_OUTPATIENT_CLINIC_OR_DEPARTMENT_OTHER)
Admit: 2020-01-19 | Discharge: 2020-01-19 | Disposition: A | Discharge status: Home / Self Care | Payer: Medicare HMO | Attending: Nurse Practitioner | Admitting: Nurse Practitioner

## 2020-01-19 ENCOUNTER — Observation Stay
Admission: EM | Admit: 2020-01-19 | Discharge: 2020-01-19 | Disposition: A | Discharge status: Home / Self Care | Payer: Medicare HMO | Attending: Internal Medicine | Admitting: Internal Medicine

## 2020-01-19 DIAGNOSIS — F419 Anxiety disorder, unspecified: Secondary | ICD-10-CM | POA: Insufficient documentation

## 2020-01-19 DIAGNOSIS — J439 Emphysema, unspecified: Secondary | ICD-10-CM | POA: Diagnosis present

## 2020-01-19 DIAGNOSIS — R918 Other nonspecific abnormal finding of lung field: Secondary | ICD-10-CM | POA: Insufficient documentation

## 2020-01-19 DIAGNOSIS — I420 Dilated cardiomyopathy: Secondary | ICD-10-CM | POA: Insufficient documentation

## 2020-01-19 DIAGNOSIS — I248 Other forms of acute ischemic heart disease: Secondary | ICD-10-CM

## 2020-01-19 DIAGNOSIS — F329 Major depressive disorder, single episode, unspecified: Secondary | ICD-10-CM | POA: Insufficient documentation

## 2020-01-19 DIAGNOSIS — I10 Essential (primary) hypertension: Secondary | ICD-10-CM | POA: Insufficient documentation

## 2020-01-19 DIAGNOSIS — J449 Chronic obstructive pulmonary disease, unspecified: Secondary | ICD-10-CM | POA: Diagnosis not present

## 2020-01-19 DIAGNOSIS — IMO0002 Reserved for concepts with insufficient information to code with codable children: Secondary | ICD-10-CM | POA: Diagnosis present

## 2020-01-19 DIAGNOSIS — Z20822 Contact with and (suspected) exposure to covid-19: Secondary | ICD-10-CM | POA: Diagnosis not present

## 2020-01-19 DIAGNOSIS — I2510 Atherosclerotic heart disease of native coronary artery without angina pectoris: Secondary | ICD-10-CM | POA: Insufficient documentation

## 2020-01-19 DIAGNOSIS — E669 Obesity, unspecified: Secondary | ICD-10-CM | POA: Diagnosis present

## 2020-01-19 DIAGNOSIS — Z6841 Body Mass Index (BMI) 40.0 and over, adult: Secondary | ICD-10-CM | POA: Diagnosis not present

## 2020-01-19 DIAGNOSIS — E1165 Type 2 diabetes mellitus with hyperglycemia: Secondary | ICD-10-CM

## 2020-01-19 DIAGNOSIS — K219 Gastro-esophageal reflux disease without esophagitis: Secondary | ICD-10-CM | POA: Diagnosis not present

## 2020-01-19 DIAGNOSIS — R0989 Other specified symptoms and signs involving the circulatory and respiratory systems: Secondary | ICD-10-CM | POA: Diagnosis not present

## 2020-01-19 DIAGNOSIS — Z794 Long term (current) use of insulin: Secondary | ICD-10-CM | POA: Insufficient documentation

## 2020-01-19 DIAGNOSIS — Z825 Family history of asthma and other chronic lower respiratory diseases: Secondary | ICD-10-CM | POA: Insufficient documentation

## 2020-01-19 DIAGNOSIS — T887XXA Unspecified adverse effect of drug or medicament, initial encounter: Secondary | ICD-10-CM

## 2020-01-19 DIAGNOSIS — T50905A Adverse effect of unspecified drugs, medicaments and biological substances, initial encounter: Secondary | ICD-10-CM | POA: Diagnosis not present

## 2020-01-19 DIAGNOSIS — Z882 Allergy status to sulfonamides status: Secondary | ICD-10-CM | POA: Diagnosis not present

## 2020-01-19 DIAGNOSIS — Z833 Family history of diabetes mellitus: Secondary | ICD-10-CM | POA: Insufficient documentation

## 2020-01-19 DIAGNOSIS — E785 Hyperlipidemia, unspecified: Secondary | ICD-10-CM | POA: Diagnosis not present

## 2020-01-19 DIAGNOSIS — Z886 Allergy status to analgesic agent status: Secondary | ICD-10-CM | POA: Insufficient documentation

## 2020-01-19 DIAGNOSIS — Z79899 Other long term (current) drug therapy: Secondary | ICD-10-CM | POA: Insufficient documentation

## 2020-01-19 DIAGNOSIS — Z888 Allergy status to other drugs, medicaments and biological substances status: Secondary | ICD-10-CM | POA: Insufficient documentation

## 2020-01-19 DIAGNOSIS — Z8249 Family history of ischemic heart disease and other diseases of the circulatory system: Secondary | ICD-10-CM | POA: Insufficient documentation

## 2020-01-19 DIAGNOSIS — E118 Type 2 diabetes mellitus with unspecified complications: Secondary | ICD-10-CM | POA: Diagnosis not present

## 2020-01-19 DIAGNOSIS — R000 Tachycardia, unspecified: Secondary | ICD-10-CM

## 2020-01-19 DIAGNOSIS — R0602 Shortness of breath: Secondary | ICD-10-CM | POA: Diagnosis not present

## 2020-01-19 DIAGNOSIS — J9611 Chronic respiratory failure with hypoxia: Secondary | ICD-10-CM | POA: Diagnosis not present

## 2020-01-19 DIAGNOSIS — Z87891 Personal history of nicotine dependence: Secondary | ICD-10-CM | POA: Insufficient documentation

## 2020-01-19 DIAGNOSIS — E119 Type 2 diabetes mellitus without complications: Secondary | ICD-10-CM | POA: Diagnosis not present

## 2020-01-19 DIAGNOSIS — R002 Palpitations: Principal | ICD-10-CM | POA: Insufficient documentation

## 2020-01-19 DIAGNOSIS — Z885 Allergy status to narcotic agent status: Secondary | ICD-10-CM | POA: Insufficient documentation

## 2020-01-19 DIAGNOSIS — Z7982 Long term (current) use of aspirin: Secondary | ICD-10-CM | POA: Diagnosis not present

## 2020-01-19 LAB — BASIC METABOLIC PANEL
Anion gap: 9 (ref 5–15)
BUN: 11 mg/dL (ref 8–23)
CO2: 28 mmol/L (ref 22–32)
Calcium: 9 mg/dL (ref 8.9–10.3)
Chloride: 103 mmol/L (ref 98–111)
Creatinine, Ser: 0.72 mg/dL (ref 0.44–1.00)
GFR calc Af Amer: >60 mL/min (ref >60)
GFR calc non Af Amer: >60 mL/min (ref >60)
Glucose, Bld: 218 mg/dL — ABNORMAL HIGH (ref 70–99)
Potassium: 3.9 mmol/L (ref 3.5–5.1)
Sodium: 140 mmol/L (ref 135–145)

## 2020-01-19 LAB — TROPONIN I (HIGH SENSITIVITY)
Troponin I (High Sensitivity): 25 ng/L — ABNORMAL HIGH (ref <18)
Troponin I (High Sensitivity): 32 ng/L — ABNORMAL HIGH (ref <18)
Troponin I (High Sensitivity): 29 ng/L — ABNORMAL HIGH (ref <18)

## 2020-01-19 LAB — CBC
HCT: 41.8 % (ref 36.0–46.0)
Hemoglobin: 13.6 g/dL (ref 12.0–15.0)
MCH: 29.2 pg (ref 26.0–34.0)
MCHC: 32.5 g/dL (ref 30.0–36.0)
MCV: 89.7 fL (ref 80.0–100.0)
Platelets: 293 10*3/uL (ref 150–400)
RBC: 4.66 MIL/uL (ref 3.87–5.11)
RDW: 13.4 % (ref 11.5–15.5)
WBC: 10.3 10*3/uL (ref 4.0–10.5)
nRBC: 0 % (ref 0.0–0.2)

## 2020-01-19 LAB — SARS CORONAVIRUS 2 BY RT PCR (HOSPITAL ORDER, PERFORMED IN ~~LOC~~ HOSPITAL LAB): SARS Coronavirus 2: NEGATIVE

## 2020-01-19 LAB — HEMOGLOBIN A1C
Hgb A1c MFr Bld: 7.9 % — ABNORMAL HIGH (ref 4.8–5.6)
Mean Plasma Glucose: 180.03 mg/dL

## 2020-01-19 LAB — GLUCOSE, CAPILLARY: Glucose-Capillary: 190 mg/dL — ABNORMAL HIGH (ref 70–99)

## 2020-01-19 MED ORDER — DIPHENHYDRAMINE HCL 50 MG/ML IJ SOLN
INTRAMUSCULAR | Status: AC
  Filled 2020-01-19: qty 1

## 2020-01-19 MED ORDER — LACTULOSE 10 GM/15ML PO SOLN
20.0000 g | Freq: Every day | ORAL | Status: DC
  Filled 2020-01-19: qty 30

## 2020-01-19 MED ORDER — MAGNESIUM OXIDE 400 (241.3 MG) MG PO TABS: 400.0000 mg | ORAL_TABLET | ORAL | Status: DC

## 2020-01-19 MED ORDER — BUTALBITAL-APAP-CAFFEINE 50-325-40 MG PO TABS: 1.0000 | ORAL_TABLET | Freq: Four times a day (QID) | ORAL | Status: DC | PRN

## 2020-01-19 MED ORDER — IPRATROPIUM-ALBUTEROL 0.5-2.5 (3) MG/3ML IN SOLN: 3.0000 mL | Freq: Once | RESPIRATORY_TRACT | Status: AC

## 2020-01-19 MED ORDER — LACTATED RINGERS IV BOLUS
500.0000 mL | Freq: Once | INTRAVENOUS | Status: AC
  Administered 2020-01-19: 500 mL via INTRAVENOUS

## 2020-01-19 MED ORDER — IPRATROPIUM-ALBUTEROL 0.5-2.5 (3) MG/3ML IN SOLN
RESPIRATORY_TRACT | Status: AC
  Administered 2020-01-19: 3 mL via RESPIRATORY_TRACT
  Filled 2020-01-19: qty 3

## 2020-01-19 MED ORDER — POLYETHYLENE GLYCOL 3350 17 G PO PACK: 17.0000 g | PACK | Freq: Two times a day (BID) | ORAL | Status: DC

## 2020-01-19 MED ORDER — ACETAMINOPHEN 325 MG PO TABS: 650.0000 mg | ORAL_TABLET | Freq: Four times a day (QID) | ORAL | Status: DC | PRN

## 2020-01-19 MED ORDER — PERFLUTREN LIPID MICROSPHERE
1.0000 mL | INTRAVENOUS | Status: AC | PRN
  Administered 2020-01-19: 3 mL via INTRAVENOUS
  Filled 2020-01-19: qty 10

## 2020-01-19 MED ORDER — ENOXAPARIN SODIUM 40 MG/0.4ML ~~LOC~~ SOLN: 40.0000 mg | Freq: Two times a day (BID) | SUBCUTANEOUS | Status: DC

## 2020-01-19 MED ORDER — METOPROLOL SUCCINATE ER 50 MG PO TB24
25.0000 mg | ORAL_TABLET | Freq: Every day | ORAL | Status: DC
  Administered 2020-01-19: 25 mg via ORAL
  Filled 2020-01-19: qty 1

## 2020-01-19 MED ORDER — ONDANSETRON HCL 4 MG PO TABS: 4.0000 mg | ORAL_TABLET | Freq: Four times a day (QID) | ORAL | Status: DC | PRN

## 2020-01-19 MED ORDER — POTASSIUM 99 MG PO TABS: 99.0000 mg | ORAL_TABLET | Freq: Every day | ORAL | Status: DC

## 2020-01-19 MED ORDER — VITAMIN D3 25 MCG (1000 UNIT) PO TABS
1000.0000 [IU] | ORAL_TABLET | Freq: Every day | ORAL | Status: DC
  Administered 2020-01-19: 1000 [IU] via ORAL
  Filled 2020-01-19 (×3): qty 1

## 2020-01-19 MED ORDER — INSULIN ASPART 100 UNIT/ML ~~LOC~~ SOLN
0.0000 [IU] | Freq: Three times a day (TID) | SUBCUTANEOUS | Status: DC
  Administered 2020-01-19: 4 [IU] via SUBCUTANEOUS
  Filled 2020-01-19: qty 1

## 2020-01-19 MED ORDER — PAROXETINE HCL 10 MG PO TABS
10.0000 mg | ORAL_TABLET | Freq: Every day | ORAL | Status: DC
  Filled 2020-01-19: qty 1

## 2020-01-19 MED ORDER — IOHEXOL 350 MG/ML SOLN
100.0000 mL | Freq: Once | INTRAVENOUS | Status: AC | PRN
  Administered 2020-01-19: 100 mL via INTRAVENOUS

## 2020-01-19 MED ORDER — ASPIRIN EC 81 MG PO TBEC
81.0000 mg | DELAYED_RELEASE_TABLET | Freq: Every day | ORAL | Status: DC
  Administered 2020-01-19: 81 mg via ORAL
  Filled 2020-01-19: qty 1

## 2020-01-19 MED ORDER — MELATONIN 5 MG PO TABS
5.0000 mg | ORAL_TABLET | Freq: Every day | ORAL | Status: DC
  Filled 2020-01-19: qty 1

## 2020-01-19 MED ORDER — DIPHENHYDRAMINE HCL 50 MG/ML IJ SOLN
50.0000 mg | Freq: Once | INTRAMUSCULAR | Status: AC
  Administered 2020-01-19: 50 mg via INTRAVENOUS

## 2020-01-19 MED ORDER — RISAQUAD PO CAPS
1.0000 | ORAL_CAPSULE | Freq: Every day | ORAL | Status: DC
  Administered 2020-01-19: 1 via ORAL
  Filled 2020-01-19 (×3): qty 1

## 2020-01-19 MED ORDER — METOPROLOL TARTRATE 5 MG/5ML IV SOLN
5.0000 mg | Freq: Once | INTRAVENOUS | Status: AC
  Administered 2020-01-19: 5 mg via INTRAVENOUS
  Filled 2020-01-19: qty 5

## 2020-01-19 MED ORDER — ONDANSETRON HCL 4 MG/2ML IJ SOLN: 4.0000 mg | Freq: Four times a day (QID) | INTRAMUSCULAR | Status: DC | PRN

## 2020-01-19 MED ORDER — IPRATROPIUM-ALBUTEROL 0.5-2.5 (3) MG/3ML IN SOLN
3.0000 mL | Freq: Four times a day (QID) | RESPIRATORY_TRACT | Status: DC | PRN
  Administered 2020-01-19 (×2): 3 mL via RESPIRATORY_TRACT
  Filled 2020-01-19 (×2): qty 3

## 2020-01-19 MED ORDER — SODIUM CHLORIDE 0.9 % IV SOLN: INTRAVENOUS | Status: DC

## 2020-01-19 MED ORDER — ROSUVASTATIN CALCIUM 20 MG PO TABS
40.0000 mg | ORAL_TABLET | Freq: Every day | ORAL | Status: DC
  Administered 2020-01-19: 40 mg via ORAL
  Filled 2020-01-19 (×2): qty 2

## 2020-01-19 NOTE — ED Provider Notes (Signed)
[facility] Emergency Department Provider Note  ____________________________________________  Time seen: Approximately 4:20 AM  I have reviewed the triage vital signs and the nursing notes.   HISTORY  Chief Complaint Palpitations and Shortness of Breath   HPI Catherine [name] is a 64 y.o. female with history of COPD, chronic respiratory failure  on 4 L [place], hypertension, hyperlipidemia, diabetes who presents for evaluation of palpitations.  Patient reports that her palpitations started yesterday at 2 PM.  Initially intermittent but this evening they became more prominent and patient had difficulty sleeping.  She denies any changes in her chronic shortness of breath or cough, fever, chills, vomiting, diarrhea.  Patient recently admitted to the hospital for constipation and impaction and discharged 10 days ago.  Patient is not on blood thinners.  DVT prophylaxis was done with SCDs only.  She denies any prior history of PE or DVT, no recent travel, no leg pain or swelling, no hemoptysis or exogenous hormones.  She is also complaining of pressure in the center of her chest associated with the palpitations.  She denies abdominal pain or back pain.  No fever or chills.  Of note patient was started on hydralazine 3 days ago for new hypertension.  Past Medical History:  Diagnosis Date  . Anxiety   . Asthma   . COPD (chronic obstructive pulmonary disease) (HCC)   . Depression   . Diabetes mellitus   . Gastritis and duodenitis June 2011   EGD  . GERD (gastroesophageal reflux disease)   . Hyperglycemia   . Hyperlipidemia   . Hypertension   . Leukocytosis   . Obesity (BMI 30-39.9)   . S/P cardiac catheterization March 2012   normal coronaries,  due to chest pain ([place])  . Screening for breast cancer Jul 28 2011   normal  . Screening for colon cancer June 2011   polyps found, next one due 2014 ([place])  . Tobacco abuse, in remission    quit oct during  hospitalization     Patient Active Problem List   Diagnosis Date Noted  . Obstipation 01/14/2020  . Hospital discharge follow-up 01/14/2020  . Colitis 01/07/2020  . Abdominal pain 01/07/2020  . Impacted stool in rectum (HCC) 01/07/2020  . Educated about COVID-19 virus infection 02/20/2019  . Microalbuminuria due to type 2 diabetes mellitus (HCC) 03/25/2018  . Bilateral chronic knee pain 12/20/2016  . Nausea in adult 12/20/2016  . Other specified nonscarring hair loss 12/20/2016  . Vitamin D deficiency 04/22/2016  . Tachycardia 11/21/2014  . Encounter for preventive health examination 09/26/2014  . Neck pain of over 3 months duration 09/26/2014  . Inflammatory arthritis 06/13/2014  . Elevated alkaline phosphatase measurement 10/09/2013  . Leg swelling 04/05/2013  . OSA (obstructive sleep apnea) 03/01/2013  . Allergic rhinitis 02/04/2013  . Pulmonary nodule 09/15/2012  . COPD exacerbation (HCC) 05/28/2012  . Chronic hypoxemic respiratory failure (HCC) 05/03/2012  . COPD (chronic obstructive pulmonary disease) (HCC) 04/28/2012  . Diabetes mellitus type 2, uncontrolled, with complications (HCC) 11/07/2011  . GERD (gastroesophageal reflux disease)   . Obesity (BMI 30-39.9)   . Anxiety   . Tobacco abuse, in remission   . Screening for colon cancer   . Screening for breast cancer   . CAD (coronary artery disease) 12/02/2010  . HTN (hypertension) 12/02/2010  . Hyperlipidemia 12/02/2010    Past Surgical History:  Procedure Laterality Date  . ABDOMINAL HYSTERECTOMY    . ABDOMINAL HYSTERECTOMY    . BILATERAL OOPHORECTOMY  1995   and TAH.  noncancerous reasons  . CARDIAC CATHETERIZATION  11/22/2010   no significant disease    Prior to Admission medications   Medication Sig Start Date End Date Taking? Authorizing Provider  acetaminophen (TYLENOL) 325 MG tablet Take 650 mg by mouth every 6 (six) hours as needed. PRN headache    [provider]  albuterol (VENTOLIN HFA)  108 (90 Base) MCG/ACT inhaler USE 2 PUFFS EVERY 6 HOURS AS NEEDED FOR WHEEZING 08/26/19   Glenford Bayley, NP  aspirin 81 MG EC tablet Take 81 mg by mouth daily.      [provider]  butalbital-acetaminophen-caffeine (FIORICET) 50-325-40 MG tablet Take 1 tablet by mouth every 6 (six) hours as needed for headache. 10/10/19   Sherlene Shams, MD  Cholecalciferol (VITAMIN D3) 125 MCG (5000 UT) CAPS Take 1,000 Units by mouth daily.     [provider]  fluconazole (DIFLUCAN) 150 MG tablet Take 1 tablet (150 mg total) by mouth daily. 01/11/20   Sherlene Shams, MD  glipiZIDE (GLUCOTROL) 5 MG tablet TAKE 1 TABLET BY MOUTH BEFORE BREAKFAST AND 2 TABLETS BEFORE DINNER 07/25/19   Sherlene Shams, MD  hydrALAZINE (APRESOLINE) 25 MG tablet Take 1 tablet (25 mg total) by mouth 3 (three) times daily. 01/12/20   Sherlene Shams, MD  ipratropium-albuterol (DUONEB) 0.5-2.5 (3) MG/3ML SOLN USE 1 VIAL VIA NEBULIZER EVERY 6 HOURS AS NEEDED 12/19/19   Sherlene Shams, MD  Lactobacillus (PROBIOTIC ACIDOPHILUS PO) Take 1 capsule by mouth daily.    [provider]  Lactulose 20 GM/30ML SOLN 30 ml every 4 hours until constipation is relieved 01/11/20   Sherlene Shams, MD  magnesium oxide (MAG-OX) 400 MG tablet Take 400 mg by mouth every other day.    [provider]  Melatonin 5 MG TABS Take 5 mg by mouth at bedtime.    [provider]  metFORMIN (GLUCOPHAGE) 500 MG tablet Take 1 tablet (500 mg total) by mouth 2 (two) times daily with a meal. 12/19/19   Sherlene Shams, MD  NOVOLIN 70/30 RELION (70-30) 100 UNIT/ML injection INJECT 15 UNITS SUBCUTANEOUSLY BEFORE BREAKFAST AND 20 UNITS BEFORE EVENING MEAL ADJUST WEEKLY PER MD 08/03/19   Sherlene Shams, MD  ondansetron (ZOFRAN ODT) 4 MG disintegrating tablet Take 1 tablet (4 mg total) by mouth every 8 (eight) hours as needed for nausea or vomiting. 01/12/20   Sherlene Shams, MD  PARoxetine (PAXIL) 10 MG tablet TAKE 1 TABLET(10 MG) BY  MOUTH EVERY MORNING 09/23/19   Sherlene Shams, MD  polyethylene glycol (MIRALAX / GLYCOLAX) 17 g packet Take 17 g by mouth 2 (two) times daily. 01/08/20   Standley Brooking, MD  Potassium 99 MG TABS Take 99 mg by mouth daily. Reported on 09/20/2015    [provider]  promethazine (PHENERGAN) 12.5 MG tablet TAKE 1 TABLET (12.5 MG TOTAL) BY MOUTH AT BEDTIME AS NEEDED FOR NAUSEA OR VOMITING. 09/23/19   Sherlene Shams, MD  rosuvastatin (CRESTOR) 40 MG tablet TAKE 1 TABLET BY MOUTH EVERY DAY 10/11/19   Sherlene Shams, MD  Semaglutide,0.25 or 0.5MG /DOS, (OZEMPIC, 0.25 OR 0.5 MG/DOSE,) 2 MG/1.5ML SOPN Inject 0.5 mg into the skin once a week. Inject 0.25 mg once weekly x 4 weeks, then increase to 0.5 mg weekly 12/19/19   Sherlene Shams, MD    Allergies Sulfa antibiotics, Hydroxychloroquine, Oxycodone-acetaminophen, Percocet [oxycodone-acetaminophen], Vicodin [hydrocodone-acetaminophen], Aspirin, Ibuprofen, and Omnipaque [iohexol]  Family History  Problem Relation Age of Onset  . Coronary artery disease Mother 105  . Heart disease Mother        CABG x5  . COPD Mother 32  . Breast cancer Mother   . Kidney disease Mother   . Heart failure Father   . Angina Father   . Diabetes Father   . Depression Father   . Stroke Father   . Dementia Father   . Asthma Brother   . Asthma Brother   . Diabetes Brother   . Hypertension Brother   . Cancer Sister        breast, lung, brain    Social History Social History   Tobacco Use  . Smoking status: Former Smoker    Packs/day: 1.00    Years: 40.00    Pack years: 40.00    Types: Cigarettes    Quit date: 06/27/2011    Years since quitting: 8.5  . Smokeless tobacco: Never Used  Substance Use Topics  . Alcohol use: No  . Drug use: No    Review of Systems  Constitutional: Negative for fever. Eyes: Negative for visual changes. ENT: Negative for sore throat. Neck: No neck pain  Cardiovascular: + chest pain and palpitations Respiratory:  Negative for shortness of breath. Gastrointestinal: Negative for abdominal pain, vomiting or diarrhea. Genitourinary: Negative for dysuria. Musculoskeletal: Negative for back pain. Skin: Negative for rash. Neurological: Negative for headaches, weakness or numbness. Psych: No SI or HI  ____________________________________________   PHYSICAL EXAM:  VITAL SIGNS: ED Triage Vitals  Enc Vitals Group     BP 01/19/20 0343 (!) 213/133     Pulse Rate 01/19/20 0343 (!) 141     Resp 01/19/20 0343 (!) 22     Temp 01/19/20 0343 97.9 F (36.6 C)     Temp Source 01/19/20 0343 Oral     SpO2 01/19/20 0342 99 %     Weight 01/19/20 0345 275 lb (124.7 kg)     Height 01/19/20 0345 5\' 3"  (1.6 m)     Head Circumference --      Peak Flow --      Pain Score --      Pain Loc --      Pain Edu? --      Excl. in [place]? --     Constitutional: Alert and oriented, in no acute apparent distress. HEENT:      Head: Normocephalic and atraumatic.         Eyes: Conjunctivae are normal. Sclera is non-icteric.       Mouth/Throat: Mucous membranes are moist.       Neck: Supple with no signs of meningismus. Cardiovascular: Tachycardic with regular rhythm. No murmurs, gallops, or rubs. 2+ symmetrical distal pulses are present in all extremities. No JVD. Respiratory: Tachypneic, satting 99% on her baseline O2 of 4L gastrointestinal: Obese, non tender, and non distended with positive bowel sounds. No rebound or guarding. Musculoskeletal: No edema, cyanosis, or erythema of extremities. Neurologic: Normal speech and language. Face is symmetric. Moving all extremities. No gross focal neurologic deficits are appreciated. Skin: Skin is warm, dry and intact. No rash noted. Psychiatric: Mood and affect are normal. Speech and behavior are normal.  ____________________________________________   LABS (all labs ordered are listed, but only abnormal results are displayed)  Labs Reviewed  BASIC METABOLIC PANEL - Abnormal;  Notable for the following components:      Result Value   Glucose, Bld 218 (*)    All other components within  normal limits  TROPONIN I (HIGH SENSITIVITY) - Abnormal; Notable for the following components:   Troponin I (High Sensitivity) 25 (*)    All other components within normal limits  TROPONIN I (HIGH SENSITIVITY) - Abnormal; Notable for the following components:   Troponin I (High Sensitivity) 32 (*)    All other components within normal limits  CBC   ____________________________________________  EKG   ED ECG REPORT I, Nita Sicklearolina Forbes Loll, the attending physician, personally viewed and interpreted this ECG.  Sinus tachycardia, rate of 141, normal intervals, normal axis, no ST elevations or depressions. ____________________________________________  RADIOLOGY  I have personally reviewed the images performed during this visit and I agree with the Radiologist's read.   Interpretation by Radiologist:  DG Chest Portable 1 View  Result Date: 01/19/2020 CLINICAL DATA:  Shortness of breath and chest palpitations EXAM: PORTABLE CHEST 1 VIEW COMPARISON:  01/02/2016 FINDINGS: Interstitial coarsening correlating with COPD history. No change from prior. There is no edema, consolidation, effusion, or pneumothorax. Normal heart size and mediastinal contours. IMPRESSION: No acute finding when compared to prior. Chronic bronchitic markings. Electronically Signed   By: Marnee SpringJonathon  Watts M.D.   On: 01/19/2020 04:19     ____________________________________________   PROCEDURES  Procedure(s) performed:yes .1-3 Lead EKG Interpretation Performed by: Nita SickleVeronese, Alexander, MD Authorized by: Nita SickleVeronese, [place], MD     ECG rate assessment: tachycardic     Rhythm: sinus tachycardia     Ectopy: none     Conduction: normal     Critical Care performed: yes  CRITICAL CARE Performed by: Nita Sicklearolina Brennyn Haisley  ?  Total critical care time: 40 min  Critical care time was exclusive of separately  billable procedures and treating other patients.  Critical care was necessary to treat or prevent imminent or life-threatening deterioration.  Critical care was time spent personally by me on the following activities: development of treatment plan with patient and/or surrogate as well as nursing, discussions with consultants, evaluation of patient's response to treatment, examination of patient, obtaining history from patient or surrogate, ordering and performing treatments and interventions, ordering and review of laboratory studies, ordering and review of radiographic studies, pulse oximetry and re-evaluation of patient's condition.  ____________________________________________   INITIAL IMPRESSION / ASSESSMENT AND PLAN / ED COURSE  64 y.o. female with history of COPD, chronic respiratory failure  on 4 L [place], hypertension, hyperlipidemia, diabetes who presents for evaluation of palpitations and chest discomfort. EKG showing sinus tachycardia with no evidence of ischemia. Patient sating well on home dose of O2, looks euvolemic. Old medical records reviewed. Patient placed on telemetry for close monitoring.   Ddx PE vs arrhythmia vs dehydration vs DKA vs infection versus medication side effect  Plan for labs, CXR followed by CTA.    _________________________ 6:26 AM on 01/19/2020 -----------------------------------------  Imaging studies visualized and interpreted by me with no evidence of pneumonia, edema, pulmonary embolism, or dissection.  Patient remains tachycardic with a pulse in the 130s.  Troponin is trending up with initial one of 25 and delta of 32, most likely demand ischemia in the setting of tachycardia.  At this time most logical explanation will be side effect to the new medication hydralazine that she was started by her primary care doctor.  Patient was given 500 cc of fluid with no changes in her heart rate.  Will give another 500 cc and 5 mg of IV metoprolol.  Will discuss with  the hospitalist for admission.  _____________________________________________ Please note:  Patient was evaluated in Emergency  Department today for the symptoms described in the history of present illness. Patient was evaluated in the context of the global COVID-19 pandemic, which necessitated consideration that the patient might be at risk for infection with the SARS-CoV-2 virus that causes COVID-19. Institutional protocols and algorithms that pertain to the evaluation of patients at risk for COVID-19 are in a state of rapid change based on information released by regulatory bodies including the CDC and federal and state organizations. These policies and algorithms were followed during the patient's care in the ED.  Some ED evaluations and interventions may be delayed as a result of limited staffing during the pandemic.   [place] Controlled Substance Database was reviewed by me. ____________________________________________   FINAL CLINICAL IMPRESSION(S) / ED DIAGNOSES   Final diagnoses:  Tachycardia  Demand ischemia (HCC)  Medication side effect      NEW MEDICATIONS STARTED DURING THIS VISIT:  ED Discharge Orders    None       Note:  This document was prepared using Dragon voice recognition software and may include unintentional dictation errors.    Don Perking, [place], MD 01/19/20 [phone]

## 2020-01-19 NOTE — Progress Notes (Signed)
PHARMACIST - PHYSICIAN ORDER COMMUNICATION  CONCERNING: P&T Medication Policy on Herbal Medications  DESCRIPTION:  This patient's order for: potassium 99mg  has been noted.  This product(s) is classified as an "herbal" or natural product. Due to a lack of definitive safety studies or FDA approval, nonstandard manufacturing practices, plus the potential risk of unknown drug-drug interactions while on inpatient medications, the Pharmacy and Therapeutics Committee does not permit the use of "herbal" or natural products of this type within [facility].   ACTION TAKEN: The pharmacy department is unable to verify this order at this time and your patient has been informed of this safety policy. Please reevaluate patient's clinical condition at discharge and address if the herbal or natural product(s) should be resumed at that time.

## 2020-01-19 NOTE — ED Notes (Signed)
Patient given ice chips per request.

## 2020-01-19 NOTE — ED Notes (Signed)
Patient states she has had IV contrast multiple times without any reaction. But she said on Jan 07, 2020 after injection she had terrible itching.  They gave her Benadryl which stopped it immediately.  Told Dr. Don Perking this and she elected to give 50 mg IV Benadryl before scan today 01-19-2020.  She had no reaction after injection today. It wasn't documented on 01-07-2020.

## 2020-01-19 NOTE — ED Triage Notes (Addendum)
Pt to ED from home reporting chest palpitations that started at 1400 yesterday. Pt also reporting SOB with exertion but reports she is always slightly SOB upon exertion. Hx of HTN but BP is 213/133 upon arrival. Pt is on 4L chronically at home.

## 2020-01-19 NOTE — Progress Notes (Signed)
*  PRELIMINARY RESULTS* Echocardiogram 2D Echocardiogram has been performed. Definity was administered at 12:36 p.m. RN present during administration w/out any adverse reaction.  Joanette Gula Miquel Lamson 01/19/2020, 12:53 PM

## 2020-01-19 NOTE — Progress Notes (Signed)
PHARMACIST - PHYSICIAN COMMUNICATION  CONCERNING:  Enoxaparin (Lovenox) for DVT Prophylaxis    RECOMMENDATION: Patient was prescribed enoxaprin 40mg  q24 hours for VTE prophylaxis.   Filed Weights   01/19/20 0345  Weight: 124.7 kg (275 lb)    Body mass index is 48.71 kg/m.  Estimated Creatinine Clearance: 90 mL/min (by C-G formula based on SCr of 0.72 mg/dL).   Based on [facility] policy patient is candidate for enoxaparin 40mg  every 12 hour dosing due to BMI being >40.   DESCRIPTION: Pharmacy has adjusted enoxaparin dose per [facility] policy.  Patient is now receiving enoxaparin 40mg  every 12 hours.    Lillianna Sabel, PharmD Clinical Pharmacist  01/19/2020 3:40 PM

## 2020-01-19 NOTE — ED Notes (Signed)
Patient given Nebulizer early due to patient stating she is having a hard time breathing due to it being hot in room, prior writer cut her lights low and decreased temperature, patient still complaining of hard time breathing. Patient says she gets her Nebulizer q 4 hrs at home

## 2020-01-19 NOTE — ED Notes (Signed)
Pt taken to CT at this time.

## 2020-01-19 NOTE — ED Notes (Signed)
Pt back from CT at this time and denies any adverse reactions from the CT contrast- Per CT tech, Pam

## 2020-01-19 NOTE — ED Notes (Signed)
Pt denies chest pain but states that she has a "sinking" feeling. When asked to describe she says that she feels like she is going to pass out. Pt denies dizziness and was able to get up to toilet.

## 2020-01-19 NOTE — ED Notes (Signed)
Patient is getting an Echocardiogram done at this time

## 2020-01-19 NOTE — H&P (Signed)
History and Physical    Wrenn Willcox QMG:[id number] DOB: 09/02/1956 DOA: 01/19/2020  PCP: Sherlene Shams, MD   Patient coming from: Home  I have personally briefly reviewed patient's old medical records in [facility]  Chief Complaint: Shortness of breath/palpitations  HPI: Gena Laski is a 64 y.o. female with medical history significant for morbid obesity, COPD, chronic respiratory failure on 4 L of oxygen, hypertension, and diabetes mellitus who presented to the emergency room for evaluation of palpitations.  Patient stated her symptoms started about 2 AM on the day of admission and was initially intermittent but has become persistent and interfered with her sleep. It lasted about 5 hours per patient. Palpitations was also associated with chest pressure mostly midsternal.  There is no radiation of the chest pressure.  She has had palpitations in the past which were transient. She denies any changes in her shortness of breath from baseline, she denies having any cough, fever, chills or any changes in her bowel habits. Chest x-ray showed no acute finding when compared to prior. Chronic bronchitic markings. 12 Lead EKG showed sinus tachycardia  ED Course: Patient seen in the emergency room for evaluation of palpitations and was tachycardic in the 130s.  She had a bump in her troponin most likely secondary to demand ischemia from tachycardia.  Patient received 1 L fluid bolus in the ER as well as 5 mg of IV metoprolol without any significant improvement in her heart rate.  She will be hospitalized for further evaluation.  Review of Systems: As per HPI otherwise 10 point review of systems negative.    Past Medical History:  Diagnosis Date  . Anxiety   . Asthma   . COPD (chronic obstructive pulmonary disease) (HCC)   . Depression   . Diabetes mellitus   . Gastritis and duodenitis June 2011   EGD  . GERD (gastroesophageal reflux disease)   . Hyperglycemia   .  Hyperlipidemia   . Hypertension   . Leukocytosis   . Obesity (BMI 30-39.9)   . S/P cardiac catheterization March 2012   normal coronaries,  due to chest pain ([place])  . Screening for breast cancer Jul 28 2011   normal  . Screening for colon cancer June 2011   polyps found, next one due 2014 ([place])  . Tobacco abuse, in remission    quit oct during hospitalization     Past Surgical History:  Procedure Laterality Date  . ABDOMINAL HYSTERECTOMY    . ABDOMINAL HYSTERECTOMY    . BILATERAL OOPHORECTOMY  1995   and TAH.  noncancerous reasons  . CARDIAC CATHETERIZATION  11/22/2010   no significant disease     reports that she quit smoking about 8 years ago. Her smoking use included cigarettes. She has a 40.00 pack-year smoking history. She has never used smokeless tobacco. She reports that she does not drink alcohol or use drugs.  Allergies  Allergen Reactions  . Sulfa Antibiotics Swelling    Patient reports caused cellulitis Other reaction(s): Other (See Comments) cellulitis  . Hydroxychloroquine     Other reaction(s): Other (See Comments) Gi upset  . Oxycodone-Acetaminophen   . Percocet [Oxycodone-Acetaminophen]   . Vicodin [Hydrocodone-Acetaminophen]   . Aspirin Other (See Comments)    Other reaction(s): Blood Disorder  . Ibuprofen     Other reaction(s): Blood Disorder  . Omnipaque [Iohexol] Itching    Patient states she has had IV contrast multiple times without any reaction. But she said on Jan 07, 2020 after  injection she had terrible itching.  They gave her Benadryl which stopped it immediately.  Told Dr. Alfred Levins this and she elected to give 50 mg IV Benadryl before scan today 01-19-2020.  She had no reaction after injection today. It wasn't documented on 01-07-2020.    Family History  Problem Relation Age of Onset  . Coronary artery disease Mother 35  . Heart disease Mother        CABG x5  . COPD Mother 73  . Breast cancer Mother   . Kidney disease Mother   .  Heart failure Father   . Angina Father   . Diabetes Father   . Depression Father   . Stroke Father   . Dementia Father   . Asthma Brother   . Asthma Brother   . Diabetes Brother   . Hypertension Brother   . Cancer Sister        breast, lung, brain     Prior to Admission medications   Medication Sig Start Date End Date Taking? Authorizing Provider  albuterol (VENTOLIN HFA) 108 (90 Base) MCG/ACT inhaler USE 2 PUFFS EVERY 6 HOURS AS NEEDED FOR WHEEZING 08/26/19  Yes Martyn Ehrich, NP  glipiZIDE (GLUCOTROL) 5 MG tablet TAKE 1 TABLET BY MOUTH BEFORE BREAKFAST AND 2 TABLETS BEFORE DINNER 07/25/19  Yes Crecencio Mc, MD  hydrALAZINE (APRESOLINE) 25 MG tablet Take 1 tablet (25 mg total) by mouth 3 (three) times daily. 01/12/20  Yes Crecencio Mc, MD  ipratropium-albuterol (DUONEB) 0.5-2.5 (3) MG/3ML SOLN USE 1 VIAL VIA NEBULIZER EVERY 6 HOURS AS NEEDED 12/19/19  Yes Crecencio Mc, MD  metFORMIN (GLUCOPHAGE) 500 MG tablet Take 1 tablet (500 mg total) by mouth 2 (two) times daily with a meal. 12/19/19  Yes Crecencio Mc, MD  ondansetron (ZOFRAN ODT) 4 MG disintegrating tablet Take 1 tablet (4 mg total) by mouth every 8 (eight) hours as needed for nausea or vomiting. 01/12/20  Yes Crecencio Mc, MD  PARoxetine (PAXIL) 10 MG tablet TAKE 1 TABLET(10 MG) BY MOUTH EVERY MORNING 09/23/19  Yes Crecencio Mc, MD  rosuvastatin (CRESTOR) 40 MG tablet TAKE 1 TABLET BY MOUTH EVERY DAY 10/11/19  Yes Crecencio Mc, MD  acetaminophen (TYLENOL) 325 MG tablet Take 650 mg by mouth every 6 (six) hours as needed. PRN headache    [provider]  aspirin 81 MG EC tablet Take 81 mg by mouth daily.      [provider]  butalbital-acetaminophen-caffeine (FIORICET) 50-325-40 MG tablet Take 1 tablet by mouth every 6 (six) hours as needed for headache. Patient not taking: Reported on 01/19/2020 10/10/19   Crecencio Mc, MD  Cholecalciferol (VITAMIN D3) 125 MCG (5000 UT) CAPS Take 1,000 Units by  mouth daily.     [provider]  Lactobacillus (PROBIOTIC ACIDOPHILUS PO) Take 1 capsule by mouth daily.    [provider]  Lactulose 20 GM/30ML SOLN 30 ml every 4 hours until constipation is relieved 01/11/20   Crecencio Mc, MD  magnesium oxide (MAG-OX) 400 MG tablet Take 400 mg by mouth every other day.    [provider]  Melatonin 5 MG TABS Take 5 mg by mouth at bedtime.    [provider]  NOVOLIN 70/30 RELION (70-30) 100 UNIT/ML injection INJECT 15 UNITS SUBCUTANEOUSLY BEFORE BREAKFAST AND 20 UNITS BEFORE EVENING MEAL ADJUST WEEKLY PER MD 08/03/19   Crecencio Mc, MD  polyethylene glycol (MIRALAX / GLYCOLAX) 17 g packet Take 17  g by mouth 2 (two) times daily. 01/08/20   Standley Brooking, MD  Potassium 99 MG TABS Take 99 mg by mouth daily. Reported on 09/20/2015    [provider]  promethazine (PHENERGAN) 12.5 MG tablet TAKE 1 TABLET (12.5 MG TOTAL) BY MOUTH AT BEDTIME AS NEEDED FOR NAUSEA OR VOMITING. 09/23/19   Sherlene Shams, MD  Semaglutide,0.25 or 0.5MG /DOS, (OZEMPIC, 0.25 OR 0.5 MG/DOSE,) 2 MG/1.5ML SOPN Inject 0.5 mg into the skin once a week. Inject 0.25 mg once weekly x 4 weeks, then increase to 0.5 mg weekly 12/19/19   Sherlene Shams, MD    Physical Exam: Vitals:   01/19/20 0915 01/19/20 0930 01/19/20 0945 01/19/20 1000  BP:  (!) 99/49  (!) 151/68  Pulse: 73 73 79 85  Resp: (!) 21 (!) 22 20 19   Temp:      TempSrc:      SpO2: 100% 100% 99% 97%  Weight:      Height:         Vitals:   01/19/20 0915 01/19/20 0930 01/19/20 0945 01/19/20 1000  BP:  (!) 99/49  (!) 151/68  Pulse: 73 73 79 85  Resp: (!) 21 (!) 22 20 19   Temp:      TempSrc:      SpO2: 100% 100% 99% 97%  Weight:      Height:        Constitutional: NAD, alert and oriented x 3. Appears comfortable and in no distress, Obese Eyes: PERRL, lids and conjunctivae normal ENMT: Mucous membranes are moist. Nasal canula in place Neck: normal, supple, no masses, no  thyromegaly Respiratory: clear to auscultation bilaterally, no wheezing, no crackles. Normal respiratory effort. No accessory muscle use.  Cardiovascular: Regular rate and rhythm, no murmurs / rubs / gallops. No extremity edema. 2+ pedal pulses. No carotid bruits.  Abdomen: no tenderness, no masses palpated. No hepatosplenomegaly. Bowel sounds positive.  Musculoskeletal: no clubbing / cyanosis. No joint deformity upper and lower extremities.  Skin: no rashes, lesions, ulcers.  Neurologic: No gross focal neurologic deficit. Psychiatric: Normal mood and affect.   Labs on Admission: I have personally reviewed following labs and imaging studies  CBC: Recent Labs  Lab 01/16/20 1936 01/19/20 0347  WBC 9.9 10.3  HGB 13.5 13.6  HCT 40.7 41.8  MCV 88.1 89.7  PLT 316 293   Basic Metabolic Panel: Recent Labs  Lab 01/12/20 1136 01/16/20 1936 01/19/20 0347  NA 139 138 140  K 4.1 4.1 3.9  CL 101 101 103  CO2 34* 29 28  GLUCOSE 190* 196* 218*  BUN 9 12 11   CREATININE 0.72 0.63 0.72  CALCIUM 8.9 8.9 9.0  MG 1.8  --   --    GFR: Estimated Creatinine Clearance: 90 mL/min (by C-G formula based on SCr of 0.72 mg/dL). Liver Function Tests: Recent Labs  Lab 01/12/20 1136  AST 36  ALT 25  ALKPHOS 95  BILITOT 0.4  PROT 6.7  ALBUMIN 3.7   No results for input(s): LIPASE, AMYLASE in the last 168 hours. No results for input(s): AMMONIA in the last 168 hours. Coagulation Profile: No results for input(s): INR, PROTIME in the last 168 hours. Cardiac Enzymes: No results for input(s): CKTOTAL, CKMB, CKMBINDEX, TROPONINI in the last 168 hours. BNP (last 3 results) No results for input(s): PROBNP in the last 8760 hours. HbA1C: No results for input(s): HGBA1C in the last 72 hours. CBG: No results for input(s): GLUCAP in the last 168 hours. Lipid  Profile: No results for input(s): CHOL, HDL, LDLCALC, TRIG, CHOLHDL, LDLDIRECT in the last 72 hours. Thyroid Function Tests: No results for  input(s): TSH, T4TOTAL, FREET4, T3FREE, THYROIDAB in the last 72 hours. Anemia Panel: No results for input(s): VITAMINB12, FOLATE, FERRITIN, TIBC, IRON, RETICCTPCT in the last 72 hours. Urine analysis:    Component Value Date/Time   COLORURINE YELLOW (A) 01/07/2020 1115   APPEARANCEUR CLEAR (A) 01/07/2020 1115   LABSPEC 1.011 01/07/2020 1115   PHURINE 6.0 01/07/2020 1115   GLUCOSEU 50 (A) 01/07/2020 1115   HGBUR NEGATIVE 01/07/2020 1115   BILIRUBINUR NEGATIVE 01/07/2020 1115   BILIRUBINUR negative 12/17/2016 1629   KETONESUR 5 (A) 01/07/2020 1115   PROTEINUR NEGATIVE 01/07/2020 1115   UROBILINOGEN 0.2 12/17/2016 1629   NITRITE NEGATIVE 01/07/2020 1115   LEUKOCYTESUR NEGATIVE 01/07/2020 1115    Radiological Exams on Admission: DG Chest Portable 1 View  Result Date: 01/19/2020 CLINICAL DATA:  Shortness of breath and chest palpitations EXAM: PORTABLE CHEST 1 VIEW COMPARISON:  01/02/2016 FINDINGS: Interstitial coarsening correlating with COPD history. No change from prior. There is no edema, consolidation, effusion, or pneumothorax. Normal heart size and mediastinal contours. IMPRESSION: No acute finding when compared to prior. Chronic bronchitic markings. Electronically Signed   By: Marnee Spring M.D.   On: 01/19/2020 04:19    EKG: Independently reviewed.  Sinus tachycardia  Assessment/Plan Principal Problem:   Heart palpitations Active Problems:   CAD (coronary artery disease)   HTN (hypertension)   Obesity (BMI 30-39.9)   Diabetes mellitus type 2, uncontrolled, with complications (HCC)   COPD (chronic obstructive pulmonary disease) (HCC)   Chronic hypoxemic respiratory failure (HCC)    Palpitations Etiology unclear Patient was tachycardic on arrival with heart rate in the 130s She received a dose of IV metoprolol with improvement in her heart rate She has a bump in her troponin most likely from demand ischemia related to tachycardia TSH is within normal limits Obtain  2D echocardiogram Consult cardiology   Morbid obesity (BMI 48.7 kg/m2) Complicates overall prognosis and care   Chronic respiratory failure Patient is a home oxygen 4 L continuous   Coronary artery disease she was Continue aspirin and statins   Hypertension Optimize blood pressure control  Diabetes mellitus Maintain consistent carbohydrate diet Sliding scale coverage   DVT prophylaxis: Lovenox  Code Status: Full Family Communication: Greater than 50% of time was spent discussing plan of care with patient at the bedside.  All questions and concerns have been addressed. Disposition Plan: Back to previous home environment Consults called: Cardiology    Demarie Uhlig MD [facility]     01/19/2020, 10:56 AM

## 2020-01-19 NOTE — ED Notes (Signed)
Patient IV removed  Per patient request, patient said the IV was becoming painful. Discussed with patient that she may have to get another IV and she said "I will, but not right now". Patient is also becoming frustrated due to unsure of her treatment plan, patient says if she is not discharged soon she will be leaving AMA.

## 2020-01-19 NOTE — Discharge Summary (Addendum)
  AMA discharge  Diagnosis: Palpitations  Catherine [name] is a 64 y.o. female with medical history significant for morbid obesity, COPD, chronic respiratory failure on 4 L of oxygen, hypertension, and diabetes mellitus who presented to the emergency room for evaluation of palpitations also associated with chest pressure mostly midsternal.    In the emergency room she was tachycardic in the 130s.  She had a bump in her troponin most likely secondary to demand ischemia from tachycardia.  Patient received 1 L fluid bolus in the ER as well as 5 mg of IV metoprolol without any significant improvement in her heart rate.  Her work-up was essentially unremarkable.  Hydralazine, started just 3 days prior to the episode was discontinued due to possible association.  She was evaluated by APP Bennie Hind on behalf of Dr. Welton Flakes, who ordered an echocardiogram to evaluate for structural abnormalities.  They recommended against any additional antihypertensives due to ' labile blood pressures'.  At 9:30 AM on the night of arrival, patient expressed a desire to sign out AGAINST MEDICAL ADVICE.  She stated that she was back to her baseline and felt like she was getting up to go home.  Additionally she did not like that she had no bed assignment that she has been in the emergency room for several hours.  I went through her records and explained to her that her echocardiogram had not yet been resulted and that I think that she will benefit from being seen by the cardiologist for clearance for what was mentioned as SVT in her record.  She stated that she would follow-up with her doctor the following day.  I did discuss the possibility that the palpitations might recur which might lead to adverse consequences.  She voiced understanding but would like she was well enough to go home.  Patient did look comfortable and did have normal vitals but I did not believe she needed to complete her cardiac clearance which I explained to her.  She  proceeded to sign out AMA.  She was advised to return to the emergency room or call 911 if symptoms recur. Vitals with BMI 01/19/2020 01/19/2020 01/19/2020  Height - - -  Weight - - -  BMI - - -  Systolic 142 - 142  Diastolic 95 - 123  Pulse 82 91 88   Physical Exam  Constitutional: She is oriented to person, place, and time. No distress.  HENT:  Head: Normocephalic.  Eyes: Pupils are equal, round, and reactive to light.  Neck: No JVD present.  Cardiovascular: Normal rate and normal heart sounds.  No murmur heard. Respiratory: Effort normal and breath sounds normal.  GI: Soft.  Musculoskeletal:        General: No edema.     Cervical back: Neck supple.  Neurological: She is alert and oriented to person, place, and time.  Skin: She is not diaphoretic.  Psychiatric: She has a normal mood and affect. Judgment and thought content normal.

## 2020-01-19 NOTE — Consult Note (Signed)
Catherine [name] is a 64 y.o. female  [id number]  Primary Cardiologist: Adrian Blackwater Reason for Consultation: Palpitations  HPI: Patient is a 64 year old Caucasian female with a past medical history of COPD requiring 4 L nasal cannula at baseline, hypertension, DMII, and obesity.  Patient states that she has had palpitations in the past but all would resolve within a couple minutes.  Patient states she was awoken last night due to her heart racing and when the palpitation continue to persist she presented to the emergency department.  Patient stated that there she had mild midsternal pressure at that time which is since resolved.  Patient was given a total of 1 L of fluid in the emergency department as well as 5 mg IV metoprolol with successful decrease in heart rate.  We have been consulted due to mild increase in troponin.   Review of Systems: Patient denies chest pain.  Patient attests to mild dyspnea on exertion which is her baseline.   Past Medical History:  Diagnosis Date  . Anxiety   . Asthma   . COPD (chronic obstructive pulmonary disease) (HCC)   . Depression   . Diabetes mellitus   . Gastritis and duodenitis June 2011   EGD  . GERD (gastroesophageal reflux disease)   . Hyperglycemia   . Hyperlipidemia   . Hypertension   . Leukocytosis   . Obesity (BMI 30-39.9)   . S/P cardiac catheterization March 2012   normal coronaries,  due to chest pain ([place])  . Screening for breast cancer Jul 28 2011   normal  . Screening for colon cancer June 2011   polyps found, next one due 2014 ([place])  . Tobacco abuse, in remission    quit oct during hospitalization     (Not in a hospital admission)    . aspirin  81 mg Oral Daily  . enoxaparin (LOVENOX) injection  40 mg Subcutaneous Q24H  . insulin aspart  0-20 Units Subcutaneous TID WC  . Lactulose  30 mL Oral Daily  . magnesium oxide  400 mg Oral QODAY  . melatonin  5 mg Oral QHS  . metoprolol succinate  25 mg Oral Daily   . PARoxetine  10 mg Oral Daily  . polyethylene glycol  17 g Oral BID  . Potassium  99 mg Oral Daily  . Probiotic Acidophilus BioBeads  1 capsule Oral Daily  . rosuvastatin  40 mg Oral Daily  . Vitamin D3  5,000 Units Oral Daily    Infusions: . sodium chloride 125 mL/hr at 01/19/20 1259    Allergies  Allergen Reactions  . Sulfa Antibiotics Swelling    Patient reports caused cellulitis Other reaction(s): Other (See Comments) cellulitis  . Hydroxychloroquine     Other reaction(s): Other (See Comments) Gi upset  . Oxycodone-Acetaminophen   . Percocet [Oxycodone-Acetaminophen]   . Vicodin [Hydrocodone-Acetaminophen]   . Aspirin Other (See Comments)    Other reaction(s): Blood Disorder  . Ibuprofen     Other reaction(s): Blood Disorder  . Omnipaque [Iohexol] Itching    Patient states she has had IV contrast multiple times without any reaction. But she said on Jan 07, 2020 after injection she had terrible itching.  They gave her Benadryl which stopped it immediately.  Told Dr. Don Perking this and she elected to give 50 mg IV Benadryl before scan today 01-19-2020.  She had no reaction after injection today. It wasn't documented on 01-07-2020.    Social History   Socioeconomic History  . Marital  status: Married    Spouse name: Not on file  . Number of children: 4  . Years of education: Not on file  . Highest education level: Not on file  Occupational History  . Occupation: Producer, television/film/video: [employer]  Tobacco Use  . Smoking status: Former Smoker    Packs/day: 1.00    Years: 40.00    Pack years: 40.00    Types: Cigarettes    Quit date: 06/27/2011    Years since quitting: 8.5  . Smokeless tobacco: Never Used  Substance and Sexual Activity  . Alcohol use: No  . Drug use: No  . Sexual activity: Never  Other Topics Concern  . Not on file  Social History Narrative  . Not on file   Social Determinants of Health   Financial Resource Strain:   . Difficulty of Paying Living  Expenses:   Food Insecurity: No Food Insecurity  . Worried About Programme researcher, broadcasting/film/video in the Last Year: Never true  . Ran Out of Food in the Last Year: Never true  Transportation Needs: No Transportation Needs  . Lack of Transportation (Medical): No  . Lack of Transportation (Non-Medical): No  Physical Activity:   . Days of Exercise per Week:   . Minutes of Exercise per Session:   Stress:   . Feeling of Stress :   Social Connections:   . Frequency of Communication with Friends and Family:   . Frequency of Social Gatherings with Friends and Family:   . Attends Religious Services:   . Active Member of Clubs or Organizations:   . Attends Banker Meetings:   Marland Kitchen Marital Status:   Intimate Partner Violence:   . Fear of Current or Ex-Partner:   . Emotionally Abused:   Marland Kitchen Physically Abused:   . Sexually Abused:     Family History  Problem Relation Age of Onset  . Coronary artery disease Mother 23  . Heart disease Mother        CABG x5  . COPD Mother 55  . Breast cancer Mother   . Kidney disease Mother   . Heart failure Father   . Angina Father   . Diabetes Father   . Depression Father   . Stroke Father   . Dementia Father   . Asthma Brother   . Asthma Brother   . Diabetes Brother   . Hypertension Brother   . Cancer Sister        breast, lung, brain    PHYSICAL EXAM: Vitals:   01/19/20 1130 01/19/20 1200  BP: (!) 147/68 (!) 172/95  Pulse: 83 86  Resp: 20 (!) 23  Temp:    SpO2: 96% 94%     Intake/Output Summary (Last 24 hours) at 01/19/2020 1358 Last data filed at 01/19/2020 1107 Gross per 24 hour  Intake 451.8 ml  Output 600 ml  Net -148.2 ml    General:  Well appearing. No respiratory difficulty HEENT: normal Neck: supple. no JVD. Carotids 2+ bilat; no bruits. No lymphadenopathy or thryomegaly appreciated. Cor: PMI nondisplaced. Regular rate & rhythm. No rubs, gallops or murmurs. Lungs: clear Abdomen: soft, nontender, nondistended. No  hepatosplenomegaly. No bruits or masses. Good bowel sounds. Extremities: no cyanosis, clubbing, rash, edema Neuro: alert & oriented x 3, cranial nerves grossly intact. moves all 4 extremities w/o difficulty. Affect pleasant.  ECG: Sinus tachycardia with evidence of possible old anterior infarct.  131/BPM.  Results for orders placed or performed during the hospital encounter  of 01/19/20 (from the past 24 hour(s))  Basic metabolic panel     Status: Abnormal   Collection Time: 01/19/20  3:47 AM  Result Value Ref Range   Sodium 140 135 - 145 mmol/L   Potassium 3.9 3.5 - 5.1 mmol/L   Chloride 103 98 - 111 mmol/L   CO2 28 22 - 32 mmol/L   Glucose, Bld 218 (H) 70 - 99 mg/dL   BUN 11 8 - 23 mg/dL   Creatinine, Ser 3.24 0.44 - 1.00 mg/dL   Calcium 9.0 8.9 - 40.1 mg/dL   GFR calc non Af Amer >60 >60 mL/min   GFR calc Af Amer >60 >60 mL/min   Anion gap 9 5 - 15  CBC     Status: None   Collection Time: 01/19/20  3:47 AM  Result Value Ref Range   WBC 10.3 4.0 - 10.5 K/uL   RBC 4.66 3.87 - 5.11 MIL/uL   Hemoglobin 13.6 12.0 - 15.0 g/dL   HCT 02.7 25.3 - 66.4 %   MCV 89.7 80.0 - 100.0 fL   MCH 29.2 26.0 - 34.0 pg   MCHC 32.5 30.0 - 36.0 g/dL   RDW 40.3 47.4 - 25.9 %   Platelets 293 150 - 400 K/uL   nRBC 0.0 0.0 - 0.2 %  Troponin I (High Sensitivity)     Status: Abnormal   Collection Time: 01/19/20  3:47 AM  Result Value Ref Range   Troponin I (High Sensitivity) 25 (H) <18 ng/L  Troponin I (High Sensitivity)     Status: Abnormal   Collection Time: 01/19/20  5:46 AM  Result Value Ref Range   Troponin I (High Sensitivity) 32 (H) <18 ng/L  SARS Coronavirus 2 by RT PCR (hospital order, performed in [facility] hospital lab) Nasopharyngeal Nasopharyngeal Swab     Status: None   Collection Time: 01/19/20  6:43 AM   Specimen: Nasopharyngeal Swab  Result Value Ref Range   SARS Coronavirus 2 NEGATIVE NEGATIVE  Troponin I (High Sensitivity)     Status: Abnormal   Collection Time: 01/19/20 12:40  PM  Result Value Ref Range   Troponin I (High Sensitivity) 29 (H) <18 ng/L  Glucose, capillary     Status: Abnormal   Collection Time: 01/19/20  1:05 PM  Result Value Ref Range   Glucose-Capillary 190 (H) 70 - 99 mg/dL   CT Angio Chest PE W and/or Wo Contrast  Result Date: 01/19/2020 CLINICAL DATA:  High probability for pulmonary embolism. Palpitations and shortness of breath EXAM: CT ANGIOGRAPHY CHEST WITH CONTRAST TECHNIQUE: Multidetector CT imaging of the chest was performed using the standard protocol during bolus administration of intravenous contrast. Multiplanar CT image reconstructions and MIPs were obtained to evaluate the vascular anatomy. CONTRAST:  OMNIPAQUE IOHEXOL 350 MG/ML SOLN COMPARISON:  Chest CT 06/03/2018 FINDINGS: Cardiovascular: Normal heart size. No pericardial effusion. No acute aortic finding. Suboptimal pulmonary artery opacification, 170 HU at the main pulmonary artery bifurcation, exacerbated by body habitus. No visible pulmonary embolism but very limited beyond the lobar to proximal segmental level. Mediastinum/Nodes: No adenopathy or mass. Lungs/Pleura: Mild centrilobular emphysema and airway thickening. Numerous pulmonary nodules measuring less than 1 cm. Most notable is clustered nodules at the left base. There has been no progression since 2019, at which time these nodules were also reported as stable. Mild atelectasis. Upper Abdomen: Negative Musculoskeletal: Thoracic spondylosis and degenerative disc narrowing Review of the MIP images confirms the above findings. IMPRESSION: 1. No evidence of pulmonary  embolism. Bolus timing and body habitus limits pulmonary artery visualization beyond the lobar to proximal segmental levels. 2. Numerous pulmonary nodules that are stable for years and considered benign. 3. COPD. Electronically Signed   By: Monte Fantasia M.D.   On: 01/19/2020 05:48   DG Chest Portable 1 View  Result Date: 01/19/2020 CLINICAL DATA:  Shortness of  breath and chest palpitations EXAM: PORTABLE CHEST 1 VIEW COMPARISON:  01/02/2016 FINDINGS: Interstitial coarsening correlating with COPD history. No change from prior. There is no edema, consolidation, effusion, or pneumothorax. Normal heart size and mediastinal contours. IMPRESSION: No acute finding when compared to prior. Chronic bronchitic markings. Electronically Signed   By: Monte Fantasia M.D.   On: 01/19/2020 04:19     ASSESSMENT AND PLAN: Patient presenting to the emergency department due to palpitations.  Patient was found to be in SVT with resolution after 1 L of fluid and 5 mg of IV metoprolol.  Currently remains in normal sinus rhythm with a rate of 80s to 90s.  Mild troponin most likely due to demand ischemia during her SVT.  Troponins have already begun downtrending with a peak in the 30s.  For palpitations plan will be to begin the patient on 25 mg metoprolol daily.  We will obtain an echocardiogram to look for any structural abnormalities.  Patient has had labile blood pressures and therefore will hold on adding any more antihypertensives at this time.  We will continue to follow the patient.  Adaline Sill NP-C

## 2020-01-20 ENCOUNTER — Other Ambulatory Visit: Payer: Self-pay | Admitting: Primary Care

## 2020-01-20 ENCOUNTER — Ambulatory Visit: Payer: Medicare HMO | Admitting: Internal Medicine

## 2020-01-20 ENCOUNTER — Telehealth: Payer: Self-pay | Admitting: Internal Medicine

## 2020-01-20 ENCOUNTER — Other Ambulatory Visit: Payer: Self-pay | Admitting: Internal Medicine

## 2020-01-20 LAB — ECHOCARDIOGRAM COMPLETE
Height: 63 in
Weight: 4400 oz

## 2020-01-20 MED ORDER — METOPROLOL SUCCINATE ER 25 MG PO TB24: 25.0000 mg | ORAL_TABLET | Freq: Every day | ORAL | 3 refills | Status: DC

## 2020-01-20 NOTE — Telephone Encounter (Signed)
Please call patient and find out

## 2020-01-20 NOTE — Telephone Encounter (Signed)
Pt stated that she was given metoprolol 25 mg in the ER last night and given IV metoprolol yesterday morning. Pt stated that they stopped her Hydralazine but did not give her a rx for the metoprolol when she left the hospital. Pt stated that since leaving the hospital last night her bp has been running 130s/70s and HR 83. Spoke with Dr. Darrick Huntsman verbally and she stated that she has sent in the metoprolol 25 mg to her pharmacy.

## 2020-01-20 NOTE — Telephone Encounter (Signed)
Don't see what medication she was changed to.

## 2020-01-20 NOTE — Telephone Encounter (Signed)
Pt is aware that medication has been sent in the [facility] in [place].

## 2020-01-20 NOTE — Telephone Encounter (Signed)
Pt was in ED last night due to HR being high. Pt states that they changed her bp medicine but didn't give her a prescription. Please call to advise.

## 2020-01-23 ENCOUNTER — Telehealth: Payer: Self-pay

## 2020-01-23 NOTE — Chronic Care Management (AMB) (Signed)
  Care Management   Note  01/23/2020 Name: Izzy Doubek MRN: [id number] DOB: September 23, 1955  Geoffrey Mankin is a 64 y.o. year old female who is a primary care patient of Darrick Huntsman, [name], MD and is actively engaged with the care management team. I reached out to [name] by phone today to assist with re-scheduling a follow up visit with the Pharmacist  Follow up plan: Telephone appointment with care management team member scheduled for:01/26/2020  Penne Lash, RMA Care Guide, Embedded Care Coordination [facility]  [place], [place] [address] Direct Dial: [phone] [email or web address]

## 2020-01-24 DIAGNOSIS — G839 Paralytic syndrome, unspecified: Secondary | ICD-10-CM | POA: Diagnosis not present

## 2020-01-24 DIAGNOSIS — J449 Chronic obstructive pulmonary disease, unspecified: Secondary | ICD-10-CM | POA: Diagnosis not present

## 2020-01-26 ENCOUNTER — Ambulatory Visit (INDEPENDENT_AMBULATORY_CARE_PROVIDER_SITE_OTHER): Payer: Medicare HMO | Admitting: Pharmacist

## 2020-01-26 ENCOUNTER — Telehealth: Payer: Self-pay | Admitting: Pharmacist

## 2020-01-26 DIAGNOSIS — E669 Obesity, unspecified: Secondary | ICD-10-CM | POA: Diagnosis not present

## 2020-01-26 DIAGNOSIS — E1165 Type 2 diabetes mellitus with hyperglycemia: Secondary | ICD-10-CM | POA: Diagnosis not present

## 2020-01-26 DIAGNOSIS — R609 Edema, unspecified: Secondary | ICD-10-CM | POA: Diagnosis not present

## 2020-01-26 DIAGNOSIS — E118 Type 2 diabetes mellitus with unspecified complications: Secondary | ICD-10-CM | POA: Diagnosis not present

## 2020-01-26 DIAGNOSIS — I10 Essential (primary) hypertension: Secondary | ICD-10-CM | POA: Diagnosis not present

## 2020-01-26 DIAGNOSIS — IMO0002 Reserved for concepts with insufficient information to code with codable children: Secondary | ICD-10-CM

## 2020-01-26 DIAGNOSIS — J449 Chronic obstructive pulmonary disease, unspecified: Secondary | ICD-10-CM | POA: Diagnosis not present

## 2020-01-26 DIAGNOSIS — R002 Palpitations: Secondary | ICD-10-CM | POA: Diagnosis not present

## 2020-01-26 DIAGNOSIS — G4739 Other sleep apnea: Secondary | ICD-10-CM | POA: Diagnosis not present

## 2020-01-26 MED ORDER — TRESIBA FLEXTOUCH 100 UNIT/ML ~~LOC~~ SOPN: 30.0000 [IU] | PEN_INJECTOR | Freq: Every day | SUBCUTANEOUS | 2 refills | Status: DC

## 2020-01-26 MED ORDER — INSULIN ASPART 100 UNIT/ML ~~LOC~~ SOLN: 6.0000 [IU] | Freq: Three times a day (TID) | SUBCUTANEOUS | 1 refills | Status: DC

## 2020-01-26 MED ORDER — OZEMPIC (0.25 OR 0.5 MG/DOSE) 2 MG/1.5ML ~~LOC~~ SOPN: 0.5000 mg | PEN_INJECTOR | SUBCUTANEOUS | 1 refills | Status: DC

## 2020-01-26 MED ORDER — GLIPIZIDE 5 MG PO TABS: 5.0000 mg | ORAL_TABLET | Freq: Two times a day (BID) | ORAL | 1 refills | Status: DC

## 2020-01-26 NOTE — Chronic Care Management (AMB) (Signed)
Chronic Care Management   Follow Up Note   01/26/2020 Name: Catherine [name] MRN: [id number] DOB: July 04, 1956  Referred by: Catherine Shams, MD Reason for referral : Chronic Care Management (Medication Management)   Catherine [name] is a 65 y.o. year old female who is a primary care patient of [name], [name], MD. The CCM team was consulted for assistance with chronic disease management and care coordination needs.    Contacted patient for medication management review.   Review of patient status, including review of consultants reports, relevant laboratory and other test results, and collaboration with appropriate care team members and the patient's provider was performed as part of comprehensive patient evaluation and provision of chronic care management services.    SDOH (Social Determinants of Health) assessments performed: Yes See Care Plan activities for detailed interventions related to Catherine [name])     Outpatient Encounter Medications as of 01/26/2020  Medication Sig Note  . glipiZIDE (GLUCOTROL) 5 MG tablet Take 1 tablet (5 mg total) by mouth 2 (two) times daily before a meal.   . metFORMIN (GLUCOPHAGE) 500 MG tablet Take 1 tablet (500 mg total) by mouth 2 (two) times daily with a meal.   . metoprolol succinate (TOPROL-XL) 25 MG 24 hr tablet Take 1 tablet (25 mg total) by mouth daily.   Catherine [name] Kitchen NOVOLIN 70/30 RELION (70-30) 100 UNIT/ML injection INJECT 15 UNITS SUBCUTANEOUSLY BEFORE BREAKFAST AND 20 UNITS BEFORE EVENING MEAL ADJUST WEEKLY PER MD 12/19/2019: 18-20 QAM, 25 QPM  . [DISCONTINUED] glipiZIDE (GLUCOTROL) 5 MG tablet TAKE 1 TABLET BY MOUTH BEFORE BREAKFAST AND 2 TABLETS BEFORE DINNER (Patient taking differently: Take 5 mg by mouth. One Tab in the morning and Two Tabs. At night.)   . acetaminophen (TYLENOL) 325 MG tablet Take 650 mg by mouth every 6 (six) hours as needed. PRN headache   . albuterol (VENTOLIN HFA) 108 (90 Base) MCG/ACT inhaler USE 2 PUFFS EVERY 6 HOURS AS NEEDED FOR  WHEEZING   . aspirin 81 MG EC tablet Take 81 mg by mouth daily.     . butalbital-acetaminophen-caffeine (FIORICET) 50-325-40 MG tablet Take 1 tablet by mouth every 6 (six) hours as needed for headache. 01/19/2020: Pt. Has stopped this medication within the past two weeks due to constipation  . Cholecalciferol (VITAMIN D3) 125 MCG (5000 UT) CAPS Take 1,000 Units by mouth daily.    . insulin aspart (NOVOLOG) 100 UNIT/ML injection Inject 6 Units into the skin 3 (three) times daily before meals.   . insulin degludec (TRESIBA FLEXTOUCH) 100 UNIT/ML FlexTouch Pen Inject 0.3 mLs (30 Units total) into the skin daily.   Catherine [name] Kitchen ipratropium-albuterol (DUONEB) 0.5-2.5 (3) MG/3ML SOLN USE 1 VIAL VIA NEBULIZER EVERY 6 HOURS AS NEEDED 12/19/2019: Taking Q4H  . Lactobacillus (PROBIOTIC ACIDOPHILUS PO) Take 1 capsule by mouth daily.   . Lactulose 20 GM/30ML SOLN 30 ml every 4 hours until constipation is relieved   . magnesium oxide (MAG-OX) 400 MG tablet Take 400 mg by mouth every other day.   . Melatonin 5 MG TABS Take 5 mg by mouth at bedtime.   . ondansetron (ZOFRAN ODT) 4 MG disintegrating tablet Take 1 tablet (4 mg total) by mouth every 8 (eight) hours as needed for nausea or vomiting.   Catherine [name] Kitchen PARoxetine (PAXIL) 10 MG tablet TAKE 1 TABLET(10 MG) BY MOUTH EVERY MORNING   . polyethylene glycol (MIRALAX / GLYCOLAX) 17 g packet Take 17 g by mouth 2 (two) times daily.   . Potassium 99 MG TABS Take 99 mg  by mouth daily. Reported on 09/20/2015   . promethazine (PHENERGAN) 12.5 MG tablet TAKE 1 TABLET (12.5 MG TOTAL) BY MOUTH AT BEDTIME AS NEEDED FOR NAUSEA OR VOMITING. 12/19/2019: Taking 2-3 times weekly  . rosuvastatin (CRESTOR) 40 MG tablet TAKE 1 TABLET BY MOUTH EVERY DAY   . Semaglutide,0.25 or 0.5MG /DOS, (OZEMPIC, 0.25 OR 0.5 MG/DOSE,) 2 MG/1.5ML SOPN Inject 0.5 mg into the skin once a week.   . [DISCONTINUED] hydrALAZINE (APRESOLINE) 25 MG tablet Take 1 tablet (25 mg total) by mouth 3 (three) times daily. 01/19/2020: Pt.  Will discontinue this medication today and start Metoprolol.   No facility-administered encounter medications on file as of 01/26/2020.     Objective:   Goals Addressed            This Visit's Progress     Patient Stated   . PharmD "I can't afford my medications" (pt-stated)       CARE PLAN ENTRY (see longtitudinal plan of care for additional care plan information)  Current Barriers:  . Polypharmacy; complex patient with multiple comorbidities including COPD (hx tobacco use); CAD, HTN, T2DM . Financial concerns: Patient reports she is unabe to afford brand name medications, including inhalers and diabetes medications.  . Has been taking Miralax daily, had one dose of lactulose; no issues with constipation  o COPD: Follows w/ Catherine [name]. Saw NP Catherine [name] 08/26/2019. Decided to apply for Catherine [name] assistance. Patient has not returned paperwork and income information yet, given recent issues requiring ED visits  o HTN: metformin succinate 25 mg daily - is seeing Catherine [name] today (cardiology) - 130s/75-80; HR 70-90s; one reading of 160/93 when she felt her "heart fluttering", and her HR was in 50-60s  o T2DM: last A1c 8.2%. Metformin 500 mg BID; Novolin 70/30 15-20 units QAM, 25-30 mg PM; glipizide 5 mg QAM, 10 mg QPM. Denies hypoglycemia. Denies issues w/ significant GI upset since increasing metformin dose - Fasting: 120s-180s, on average, <150 - After supper: 127-215, on average <180 o ASCVD risk reduction: rosuvastatin 40 mg daily, last LDL at goal ~70; ASA 81 mg daily   Pharmacist Clinical Goal(s):  Catherine [name] Kitchen Over the next 90 days, patient will work with PharmD and provider towards optimized medication management  Interventions: . Comprehensive medication review performed; medication list updated in electronic medical record . Inter-disciplinary care team collaboration (see longitudinal plan of care) . Continue metformin 500 mg BID. Start Ozempic 0.25 mg once weekly x 4 weeks, then increase  to 0.5 mg weekly. Will provide sample.  . Decrease glipizide to 5 mg BID to reduce risk of hypoglycemia w/ Ozempic start . Will pursue Novo patient assistance for Ozempic, as well as better basal/bolus insulin regimen. Will prepare provider portions for Dr. Derrel Nip to sign. When patient comes to pick up Ozempic sample, she will drop off [employer] paperwork, income information, and Development worker, community. Once all parts received, will pass to [employer], CPhT for submission and follow up. . Ideally, will be able to significantly reduce insulin and glipizide burden to reduce risk of weight gain and hypoglycemia.   Patient Self Care Activities:  . Patient will take medications as prescribed  Please see past updates related to this goal by clicking on the "Past Updates" button in the selected goal          Plan:  - Will collaborate w/ patient as above - Will outreach in ~ 3 weeks to f/u   Catie Darnelle Maffucci, PharmD, [place], [place]  [place] [phone]

## 2020-01-26 NOTE — Telephone Encounter (Signed)
Medication Samples have been pulled and labeled for the patient. Has been documented in Sample binder. In sample refrigerator for pick up  Drug: Ozempic Strength: 0.25/0.5  Qty: 1 pen LOT: [id number] Exp.Date: 01/2022 Dosing instructions: Inject 0.25 mg once weekly for 4 weeks, then increase to 0.5 mg weekly   Patient also has paperwork to complete at the front desk to sign/date and leave for me, and is bringing paperwork from home for me as well. See CCM note for details.

## 2020-01-26 NOTE — Patient Instructions (Addendum)
Catherine [name],   Here is our plan:   1) Start Ozempic 0.25 mg once weekly for 4 weeks, then increase to 0.5 mg weekly.  2) Decrease the evening dose of glipizide to 5 mg, so you are taking 5 mg twice daily. If you start to have hypoglycemia after meals, feel free to stop this medication.   Novolin 70/30 is a mix of long and short acting insulin. We are going to apply for patient assistance for separate long and short acting insulins, as we can better adjust doses to achieve better glucose control.   Based on your current insulin dosing, I am writing the prescriptions to [employer] patient assistance for:  - Tresiba 30 units daily (long acting) - Novolog 6 units three times daily with meals (short acting)  However, as the Ozempic kicks in, we likely will be able to reduce those doses, and possibly eliminate Novolog all together.   We need from you:  1) The enclosed [employer] applications (I've highlighted the spots you need to complete) 2) Copy of 2020 tax return 3) [employer] applications for the new inhaler, Breztri  I'll get the above sent into the drug companies!  Please call me with any questions or concerns.    Catherine [name], PharmD [id number]  Visit Information  Goals Addressed            This Visit's Progress     Patient Stated   . PharmD "I can't afford my medications" (pt-stated)       CARE PLAN ENTRY (see longtitudinal plan of care for additional care plan information)  Current Barriers:  . Polypharmacy; complex patient with multiple comorbidities including COPD (hx tobacco use); CAD, HTN, T2DM . Financial concerns: Patient reports she is unabe to afford brand name medications, including inhalers and diabetes medications.  . Has been taking Miralax daily, had one dose of lactulose; no issues with constipation  o COPD: Follows w/ [facility]. Saw NP Catherine [name] 08/26/2019. Decided to apply for [facility] assistance. Patient has not returned paperwork and income  information yet, given recent issues requiring ED visits  o HTN: metformin succinate 25 mg daily - is seeing Catherine [name] today (cardiology) - 130s/75-80; HR 70-90s; one reading of 160/93 when she felt her "heart fluttering", and her HR was in 50-60s  o T2DM: last A1c 8.2%. Metformin 500 mg BID; Novolin 70/30 15-20 units QAM, 25-30 mg PM; glipizide 5 mg QAM, 10 mg QPM. Denies hypoglycemia. Denies issues w/ significant GI upset since increasing metformin dose - Fasting: 120s-180s, on average, <150 - After supper: 127-215, on average <180 o ASCVD risk reduction: rosuvastatin 40 mg daily, last LDL at goal ~70; ASA 81 mg daily   Pharmacist Clinical Goal(s):  Marland Kitchen Over the next 90 days, patient will work with PharmD and provider towards optimized medication management  Interventions: . Comprehensive medication review performed; medication list updated in electronic medical record . Inter-disciplinary care team collaboration (see longitudinal plan of care) . Continue metformin 500 mg BID. Start Ozempic 0.25 mg once weekly x 4 weeks, then increase to 0.5 mg weekly. Will provide sample.  . Decrease glipizide to 5 mg BID to reduce risk of hypoglycemia w/ Ozempic start . Will pursue [employer] patient assistance for Ozempic, as well as better basal/bolus insulin regimen. Will prepare provider portions for Dr. Darrick [name] to sign. When patient comes to pick up Ozempic sample, she will drop off [name] paperwork, income information, and Midwife. Once all parts received, will pass  to Catherine [name], CPhT for submission and follow up. . Ideally, will be able to significantly reduce insulin and glipizide burden to reduce risk of weight gain and hypoglycemia.   Patient Self Care Activities:  . Patient will take medications as prescribed  Please see past updates related to this goal by clicking on the "Past Updates" button in the selected goal         Patient verbalizes understanding of instructions provided  today.    Plan:  - Will collaborate w/ patient as above  - Will outreach in ~ 4 weeks to f/u   Catherine Darnelle Maffucci, PharmD, [place], [place] Pharmacist  Catherine [name]  [phone]

## 2020-01-29 DIAGNOSIS — R69 Illness, unspecified: Secondary | ICD-10-CM | POA: Diagnosis not present

## 2020-02-01 NOTE — Telephone Encounter (Signed)
Pt has not picked up the sample yet. Reached out to pt and she stated that she is having trouble getting to the office to pick it up.

## 2020-02-01 NOTE — Telephone Encounter (Signed)
Pt called back needing to talk about the samples

## 2020-02-01 NOTE — Telephone Encounter (Signed)
Do you know if patient has picked up medication.

## 2020-02-02 NOTE — Telephone Encounter (Signed)
Patient is going to have her son or husband come pick up the sample and pick up the patient assistance paperwork to sign. They will take the paperwork back to her, she will sign, and have them bring it back up here to clinic for me to pass to CPhT  Let patient know they would need her DOB to pick up medication. Encouraged to ask for me, since I spoke with patient this morning and discussed them having permission to pick up her medication

## 2020-02-02 NOTE — Telephone Encounter (Signed)
Patient's son, Christiane Ha, picked up Ozempic sample. Counseled on storage.

## 2020-02-03 ENCOUNTER — Other Ambulatory Visit: Payer: Self-pay | Admitting: Internal Medicine

## 2020-02-03 NOTE — Telephone Encounter (Deleted)
Cologuard placed through [employer].

## 2020-02-07 ENCOUNTER — Other Ambulatory Visit: Payer: Self-pay | Admitting: Pharmacy Technician

## 2020-02-07 NOTE — Patient Outreach (Signed)
[facility]) Care Management  02/07/2020  Catherine [name] 1956/01/21 [id number]   Received both patient and provider portion(s) of patient assistance application(s) for Breztri and Proventil HFA. Faxed and mailed completed application and required documents into [employer] and [employer].  Will follow up with company(ies) in 5-14 business days to check status of application(s).  Catherine Winner P. Catherine [name], CPhT [facility] [employer]  [phone]

## 2020-02-08 DIAGNOSIS — G4700 Insomnia, unspecified: Secondary | ICD-10-CM | POA: Diagnosis not present

## 2020-02-08 DIAGNOSIS — Z794 Long term (current) use of insulin: Secondary | ICD-10-CM | POA: Diagnosis not present

## 2020-02-08 DIAGNOSIS — J9610 Chronic respiratory failure, unspecified whether with hypoxia or hypercapnia: Secondary | ICD-10-CM | POA: Diagnosis not present

## 2020-02-08 DIAGNOSIS — E11630 Type 2 diabetes mellitus with periodontal disease: Secondary | ICD-10-CM | POA: Diagnosis not present

## 2020-02-08 DIAGNOSIS — E785 Hyperlipidemia, unspecified: Secondary | ICD-10-CM | POA: Diagnosis not present

## 2020-02-08 DIAGNOSIS — E1165 Type 2 diabetes mellitus with hyperglycemia: Secondary | ICD-10-CM | POA: Diagnosis not present

## 2020-02-08 DIAGNOSIS — J439 Emphysema, unspecified: Secondary | ICD-10-CM | POA: Diagnosis not present

## 2020-02-08 DIAGNOSIS — R69 Illness, unspecified: Secondary | ICD-10-CM | POA: Diagnosis not present

## 2020-02-10 DIAGNOSIS — R002 Palpitations: Secondary | ICD-10-CM | POA: Diagnosis not present

## 2020-02-14 ENCOUNTER — Other Ambulatory Visit: Payer: Self-pay | Admitting: Pharmacy Technician

## 2020-02-14 DIAGNOSIS — I209 Angina pectoris, unspecified: Secondary | ICD-10-CM | POA: Diagnosis not present

## 2020-02-14 DIAGNOSIS — E782 Mixed hyperlipidemia: Secondary | ICD-10-CM | POA: Diagnosis not present

## 2020-02-14 DIAGNOSIS — I10 Essential (primary) hypertension: Secondary | ICD-10-CM | POA: Diagnosis not present

## 2020-02-14 DIAGNOSIS — R002 Palpitations: Secondary | ICD-10-CM | POA: Diagnosis not present

## 2020-02-14 NOTE — Patient Outreach (Signed)
[facility]) Care Management  02/14/2020  Catherine [name] 02/27/1956 [id number]  Received both patient and provider portion(s) of patient assistance application(s) for [place] and Novolog. Faxed completed application and required documents into [employer].  Will follow up with company(ies) in 3-5 business days to check status of application(s).  [name] P. [name], CPhT [facility] [employer]  [phone]

## 2020-02-15 ENCOUNTER — Other Ambulatory Visit: Payer: Self-pay | Admitting: Pharmacy Technician

## 2020-02-15 NOTE — Patient Outreach (Signed)
[facility]) Care Management  02/15/2020  Catherine [name] 1956-05-04 [id number]  Care coordination call placed to [employer] in regards to [facility] application.  Spoke to [place] who informed patient was APPROVED 02/08/2020-09/07/2020. She informed the tracking number is [id number] with [employer]. She informed the expected delivery date is 02/17/2020 by 7pm.   Will follow up with patient in 5-7 business days to confirmed medication was received and to discuss refill procedure.  Theon Sobotka P. Laneta Guerin, CPhT [facility] [employer]  [phone]

## 2020-02-16 ENCOUNTER — Other Ambulatory Visit: Payer: Self-pay | Admitting: Pharmacy Technician

## 2020-02-16 ENCOUNTER — Other Ambulatory Visit: Payer: Self-pay | Admitting: Internal Medicine

## 2020-02-16 DIAGNOSIS — R69 Illness, unspecified: Secondary | ICD-10-CM | POA: Diagnosis not present

## 2020-02-16 NOTE — Patient Outreach (Signed)
[facility]) Care Management  02/16/2020  Catherine [name] Jan 10, 1956 [id number]  Care coordination call placed to [employer] in regards to patient's application for Tresiba, Novolog and Ozempic.  Spoke to [place] who informed that the application was missing the sig on the [place] and Novolog portion of the application. She informed that would need to be corrected and faxed back in before the processing could continue.  Reached out to embedded [facility] RPh [name] who will update application and fax back to [employer] today.  Will follow up with [employer] in 2-3 business days to inquire if a determination has been made.  Aldred Mase P. Shirla Hodgkiss, CPhT [facility] [employer]  [phone]

## 2020-02-20 ENCOUNTER — Other Ambulatory Visit: Payer: Self-pay | Admitting: Pharmacy Technician

## 2020-02-20 DIAGNOSIS — I209 Angina pectoris, unspecified: Secondary | ICD-10-CM | POA: Diagnosis not present

## 2020-02-20 NOTE — Patient Outreach (Signed)
[facility]) Care Management  02/20/2020  Catherine [name] [birth year] [id number]  Care coordination call placed to [employer] in regards to  [place], Novolog and Ozempic application.  Spoke to [place] and patient is APPROVED 02/15/2020-08/07/2020. She informed the Ozempic order was sent to processing on 02/16/2020 and the [place] and Novolog orders were sent to processing today.  She informed typically it takes 10-14 business days to be delivered to the office but they are running behind in shipping medications out to provider's offices. The earliest refill for the Ozempic is 05/06/2020 and for the [place] and Novolog is is 05/10/2020.  Will follow up with patient in 14-21 business days to inquire if medications were received.  Feliciana Narayan P. Catherine [name], CPhT [facility] [employer]  [phone]

## 2020-02-21 ENCOUNTER — Ambulatory Visit (INDEPENDENT_AMBULATORY_CARE_PROVIDER_SITE_OTHER): Payer: Medicare HMO

## 2020-02-21 VITALS — Ht 63.0 in | Wt 275.0 lb

## 2020-02-21 DIAGNOSIS — Z0000 Encounter for general adult medical examination without abnormal findings: Secondary | ICD-10-CM | POA: Diagnosis not present

## 2020-02-21 DIAGNOSIS — Z780 Asymptomatic menopausal state: Secondary | ICD-10-CM

## 2020-02-21 DIAGNOSIS — Z1159 Encounter for screening for other viral diseases: Secondary | ICD-10-CM

## 2020-02-21 NOTE — Patient Instructions (Addendum)
Catherine [name] , Thank you for taking time to come for your Medicare Wellness Visit. I appreciate your ongoing commitment to your health goals. Please review the following plan we discussed and let me know if I can assist you in the future.   These are the goals we discussed: Goals      Patient Stated   .  DIET - INCREASE WATER INTAKE (pt-stated)      Add flavor packets Stay hydrated    .  Increase physical activity (pt-stated)      Chair exercises  Leg lifts Arm raises Repetitive motions    .  PharmD "I can't afford my medications" (pt-stated)      CARE PLAN ENTRY (see longtitudinal plan of care for additional care plan information)  Current Barriers:  . Polypharmacy; complex patient with multiple comorbidities including COPD (hx tobacco use); CAD, HTN, T2DM . Financial concerns: Patient reports she is unabe to afford brand name medications, including inhalers and diabetes medications.  . Has been taking Miralax daily, had one dose of lactulose; no issues with constipation  o COPD: Follows w/ [facility]. Saw NP Clent Ridges 08/26/2019. Decided to apply for [facility] assistance. Patient has not returned paperwork and income information yet, given recent issues requiring ED visits  o HTN: metformin succinate 25 mg daily - is seeing Dr. Milta Deiters today (cardiology) - 130s/75-80; HR 70-90s; one reading of 160/93 when she felt her "heart fluttering", and her HR was in 50-60s  o T2DM: last A1c 8.2%. Metformin 500 mg BID; Novolin 70/30 15-20 units QAM, 25-30 mg PM; glipizide 5 mg QAM, 10 mg QPM. Denies hypoglycemia. Denies issues w/ significant GI upset since increasing metformin dose - Fasting: 120s-180s, on average, <150 - After supper: 127-215, on average <180 o ASCVD risk reduction: rosuvastatin 40 mg daily, last LDL at goal ~70; ASA 81 mg daily   Pharmacist Clinical Goal(s):  Marland Kitchen Over the next 90 days, patient will work with PharmD and provider towards optimized medication  management  Interventions: . Comprehensive medication review performed; medication list updated in electronic medical record . Inter-disciplinary care team collaboration (see longitudinal plan of care) . Continue metformin 500 mg BID. Start Ozempic 0.25 mg once weekly x 4 weeks, then increase to 0.5 mg weekly. Will provide sample.  . Decrease glipizide to 5 mg BID to reduce risk of hypoglycemia w/ Ozempic start . Will pursue [employer] patient assistance for Ozempic, as well as better basal/bolus insulin regimen. Will prepare provider portions for Dr. Darrick Huntsman to sign. When patient comes to pick up Ozempic sample, she will drop off [employer] paperwork, income information, and Midwife. Once all parts received, will pass to [employer], CPhT for submission and follow up. . Ideally, will be able to significantly reduce insulin and glipizide burden to reduce risk of weight gain and hypoglycemia.   Patient Self Care Activities:  . Patient will take medications as prescribed  Please see past updates related to this goal by clicking on the "Past Updates" button in the selected goal         This is a list of the screening recommended for you and due dates:  Health Maintenance  Topic Date Due  .  Hepatitis C: One time screening is recommended by Center for Disease Control  (CDC) for  adults born from [birth year] through 1967.   Never done  . Tetanus Vaccine  12/23/2015  . Eye exam for diabetics  01/29/2017  . Mammogram  08/19/2018  . Urine  Protein Check  03/24/2019  . DEXA scan (bone density measurement)  Never done  . Pneumonia vaccines (2 of 2 - PPSV23) 07/31/2019  . COVID-19 Vaccine (1) 03/08/2020*  . Flu Shot  04/08/2020  . Hemoglobin A1C  07/21/2020  . Colon Cancer Screening  09/25/2020  . Complete foot exam   01/11/2021  . HIV Screening  Completed  *Topic was postponed. The date shown is not the original due date.

## 2020-02-21 NOTE — Progress Notes (Signed)
Subjective:   Catherine [name] is a 64 y.o. female who presents for Medicare Annual (Subsequent) preventive examination.  Review of Systems:  No ROS.  Medicare Wellness Virtual Visit.    Cardiac Risk Factors include: advanced age (>50men, >15 women);diabetes mellitus;hypertension     Objective:     Vitals: Ht 5\' 3"  (1.6 m)   Wt 275 lb (124.7 kg)   BMI 48.71 kg/m   Body mass index is 48.71 kg/m.  Advanced Directives 02/21/2020 01/19/2020 01/16/2020 01/07/2020 01/07/2020 09/14/2019 02/18/2019  Does Patient Have a Medical Advance Directive? No No No No No No No  Would patient like information on creating a medical advance directive? No - Patient declined No - Patient declined - No - Patient declined No - Patient declined Yes (MAU/Ambulatory/Procedural Areas - Information given) Yes (MAU/Ambulatory/Procedural Areas - Information given)    Tobacco Social History   Tobacco Use  Smoking Status Former Smoker  . Packs/day: 1.00  . Years: 40.00  . Pack years: 40.00  . Types: Cigarettes  . Quit date: 06/27/2011  . Years since quitting: 8.6  Smokeless Tobacco Never Used     Counseling given: Not Answered   Clinical Intake:  Pre-visit preparation completed: Yes           How often do you need to have someone help you when you read instructions, pamphlets, or other written materials from your doctor or pharmacy?: 1 - Never  Interpreter Needed?: No     Past Medical History:  Diagnosis Date  . Anxiety   . Asthma   . COPD (chronic obstructive pulmonary disease) (HCC)   . Depression   . Diabetes mellitus   . Gastritis and duodenitis June 2011   EGD  . GERD (gastroesophageal reflux disease)   . Hyperglycemia   . Hyperlipidemia   . Hypertension   . Leukocytosis   . Obesity (BMI 30-39.9)   . S/P cardiac catheterization March 2012   normal coronaries,  due to chest pain ([place])  . Screening for breast cancer Jul 28 2011   normal  . Screening for colon cancer June 2011    polyps found, next one due 2014 ([place])  . Tobacco abuse, in remission    quit oct during hospitalization    Past Surgical History:  Procedure Laterality Date  . ABDOMINAL HYSTERECTOMY    . ABDOMINAL HYSTERECTOMY    . BILATERAL OOPHORECTOMY  1995   and TAH.  noncancerous reasons  . CARDIAC CATHETERIZATION  11/22/2010   no significant disease   Family History  Problem Relation Age of Onset  . Coronary artery disease Mother 17  . Heart disease Mother        CABG x5  . COPD Mother 69  . Breast cancer Mother   . Kidney disease Mother   . Heart failure Father   . Angina Father   . Diabetes Father   . Depression Father   . Stroke Father   . Dementia Father   . Asthma Brother   . Asthma Brother   . Diabetes Brother   . Hypertension Brother   . Cancer Sister        breast, lung, brain   Social History   Socioeconomic History  . Marital status: Married    Spouse name: Not on file  . Number of children: 4  . Years of education: Not on file  . Highest education level: Not on file  Occupational History  . 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 Smoking status: Former Smoker    Packs/day: 1.00    Years: 40.00    Pack years: 40.00    Types: Cigarettes    Quit date: 06/27/2011    Years since quitting: 8.6  . Smokeless tobacco: Never Used  Vaping Use  . Vaping Use: Never used  Substance and Sexual Activity  . Alcohol use: No  . Drug use: No  . Sexual activity: Never  Other Topics Concern  . Not on file  Social History Narrative  . Not on file   Social Determinants of Health   Financial Resource Strain:   . Difficulty of Paying Living Expenses:   Food Insecurity: No Food Insecurity  . Worried About Programme researcher, broadcasting/film/video in the Last Year: Never true  . Ran Out of Food in the Last Year: Never true  Transportation Needs: No Transportation Needs  . Lack of Transportation (Medical): No  . Lack of Transportation (Non-Medical): No  Physical  Activity:   . Days of Exercise per Week:   . Minutes of Exercise per Session:   Stress:   . Feeling of Stress :   Social Connections:   . Frequency of Communication with Friends and Family:   . Frequency of Social Gatherings with Friends and Family:   . Attends Religious Services:   . Active Member of Clubs or Organizations:   . Attends Banker Meetings:   Marland Kitchen Marital Status:     Outpatient Encounter Medications as of 02/21/2020  Medication Sig  . acetaminophen (TYLENOL) 325 MG tablet Take 650 mg by mouth every 6 (six) hours as needed. PRN headache  . albuterol (VENTOLIN HFA) 108 (90 Base) MCG/ACT inhaler USE 2 PUFFS EVERY 6 HOURS AS NEEDED FOR WHEEZING  . aspirin 81 MG EC tablet Take 81 mg by mouth daily.    . Cholecalciferol (VITAMIN D3) 125 MCG (5000 UT) CAPS Take 1,000 Units by mouth daily.   Marland Kitchen glipiZIDE (GLUCOTROL) 5 MG tablet Take 1 tablet (5 mg total) by mouth 2 (two) times daily before a meal.  . insulin aspart (NOVOLOG) 100 UNIT/ML injection Inject 6 Units into the skin 3 (three) times daily before meals.  . insulin degludec (TRESIBA FLEXTOUCH) 100 UNIT/ML FlexTouch Pen Inject 0.3 mLs (30 Units total) into the skin daily.  Marland Kitchen ipratropium-albuterol (DUONEB) 0.5-2.5 (3) MG/3ML SOLN USE 1 VIAL VIA NEBULIZER EVERY 6 HOURS AS NEEDED  . Lactobacillus (PROBIOTIC ACIDOPHILUS PO) Take 1 capsule by mouth daily.  . Lactulose 20 GM/30ML SOLN 30 ml every 4 hours until constipation is relieved  . magnesium oxide (MAG-OX) 400 MG tablet Take 400 mg by mouth every other day.  . Melatonin 5 MG TABS Take 5 mg by mouth at bedtime.  . metFORMIN (GLUCOPHAGE) 500 MG tablet Take 1 tablet (500 mg total) by mouth 2 (two) times daily with a meal.  . metoprolol succinate (TOPROL-XL) 25 MG 24 hr tablet Take 1 tablet (25 mg total) by mouth daily.  Marland Kitchen NOVOLIN 70/30 RELION (70-30) 100 UNIT/ML injection INJECT 15 UNITS SUBCUTANEOUSLY BEFORE BREAKFAST AND 20 UNITS BEFORE EVENING MEAL ADJUST WEEKLY PER  MD  . ondansetron (ZOFRAN ODT) 4 MG disintegrating tablet Take 1 tablet (4 mg total) by mouth every 8 (eight) hours as needed for nausea or vomiting.  Marland Kitchen PARoxetine (PAXIL) 10 MG tablet TAKE 1 TABLET(10 MG) BY MOUTH EVERY MORNING  . polyethylene glycol (MIRALAX / GLYCOLAX) 17 g packet Take 17 g by mouth 2 (two) times daily.  Marland Kitchen  Potassium 99 MG TABS Take 99 mg by mouth daily. Reported on 09/20/2015  . promethazine (PHENERGAN) 12.5 MG tablet TAKE 1 TABLET (12.5 MG TOTAL) BY MOUTH AT BEDTIME AS NEEDED FOR NAUSEA OR VOMITING.  . rosuvastatin (CRESTOR) 40 MG tablet TAKE 1 TABLET BY MOUTH EVERY DAY  . Semaglutide,0.25 or 0.5MG /DOS, (OZEMPIC, 0.25 OR 0.5 MG/DOSE,) 2 MG/1.5ML SOPN Inject 0.5 mg into the skin once a week.  . butalbital-acetaminophen-caffeine (FIORICET) 50-325-40 MG tablet Take 1 tablet by mouth every 6 (six) hours as needed for headache.   No facility-administered encounter medications on file as of 02/21/2020.    Activities of Daily Living In your present state of health, do you have any difficulty performing the following activities: 02/21/2020 01/07/2020  Hearing? N N  Vision? N N  Difficulty concentrating or making decisions? N N  Walking or climbing stairs? N N  Dressing or bathing? N N  Doing errands, shopping? N N  Comment - -  Quarry manager and eating ? Y -  Comment Husband meal preps. Self feeds. -  Using the Toilet? N -  In the past six months, have you accidently leaked urine? Y -  Comment Stress incontinence. Managed withh daily pad. -  Do you have problems with loss of bowel control? N -  Managing your Medications? N -  Managing your Finances? N -  Housekeeping or managing your Housekeeping? Y -  Comment Pace self. -  Some recent data might be hidden    Patient Care Team: Sherlene Shams, MD as PCP - General (Internal Medicine) Lourena Simmonds, [facility] as Pharmacist (Pharmacist)    Assessment:   This is a routine wellness examination for Yumiko.  I connected  with Debbie today by telephone and verified that I am speaking with the correct person using two identifiers. Location patient: home Location provider: work Persons participating in the virtual visit: patient, Engineer, civil (consulting).    I discussed the limitations, risks, security and privacy concerns of performing an evaluation and management service by telephone and the availability of in person appointments. The patient expressed understanding and verbally consented to this telephonic visit.    Interactive audio and video telecommunications were attempted between this provider and patient, however failed, due to patient having technical difficulties OR patient did not have access to video capability.  We continued and completed visit with audio only.  Some vital signs may be absent or patient reported.   Health Maintenance Due: -PNA vaccine- discussed; to be completed with doctor in visit or local pharmacy.  -Tdap vaccine- discussed; to be completed with doctor in visit or local pharmacy.   -Hep C screening- consent given -Covid vaccine- deferred per patient preference -Dexa Scan- consent to order -Eye Exam- plans to schedule -Hgb A1c- 01/19/20 ( 7.9) See completed HM at the end of note.   Eye: Visual acuity not assessed. Virtual visit. Followed by their ophthalmologist. Retinopathy- none reported.  Dental: Visits every 6 months.    Hearing: Demonstrates normal hearing during visit.  Safety:  Patient feels safe at home- yes Patient does have smoke detectors at home- yes Patient does wear sunscreen or protective clothing when in direct sunlight - yes Patient does wear seat belt when in a moving vehicle - yes Patient drives- yes Adequate lighting in walkways free from debris- yes Grab bars and handrails used as appropriate- yes Ambulates with an assistive device- yes; walker  Cell phone on person when ambulating outside of the home-yes  Social: Alcohol intake -  no Smoking history-  former Smokers in home? none Illicit drug use? none  Medication: Taking as directed and without issues.  Pill box in use -yes  Self managed - yes  Oxygen in use 4L daily  Covid-19: Precautions and sickness symptoms discussed. Wears mask, social distancing, hand hygiene as appropriate.   Activities of Daily Living Patient denies needing assistance with: household chores, feeding themselves, getting from bed to chair, getting to the toilet, bathing/showering, dressing, managing money, or preparing meals.   Discussed the importance of a healthy diet, water intake and the benefits of aerobic exercise.   Physical activity- online chair exercises  Diet:  Low carb Water: she tries to drink  Caffeine: 2-3 soft drinks  Other Providers Patient Care Team: Sherlene Shams, MD as PCP - General (Internal Medicine) Lourena Simmonds, [facility] as Pharmacist (Pharmacist)  Exercise Activities and Dietary recommendations Current Exercise Habits: The patient does not participate in regular exercise at present  Goals      Patient Stated   .  DIET - INCREASE WATER INTAKE (pt-stated)      Add flavor packets Stay hydrated    .  Increase physical activity (pt-stated)      Chair exercises  Leg lifts Arm raises Repetitive motions    .  PharmD "I can't afford my medications" (pt-stated)      CARE PLAN ENTRY (see longtitudinal plan of care for additional care plan information)  Current Barriers:  . Polypharmacy; complex patient with multiple comorbidities including COPD (hx tobacco use); CAD, HTN, T2DM . Financial concerns: Patient reports she is unabe to afford brand name medications, including inhalers and diabetes medications.  . Has been taking Miralax daily, had one dose of lactulose; no issues with constipation  o COPD: Follows w/ [facility]. Saw NP Clent Ridges 08/26/2019. Decided to apply for [facility] assistance. Patient has not returned paperwork and income information yet, given recent  issues requiring ED visits  o HTN: metformin succinate 25 mg daily - is seeing Dr. Milta Deiters today (cardiology) - 130s/75-80; HR 70-90s; one reading of 160/93 when she felt her "heart fluttering", and her HR was in 50-60s  o T2DM: last A1c 8.2%. Metformin 500 mg BID; Novolin 70/30 15-20 units QAM, 25-30 mg PM; glipizide 5 mg QAM, 10 mg QPM. Denies hypoglycemia. Denies issues w/ significant GI upset since increasing metformin dose - Fasting: 120s-180s, on average, <150 - After supper: 127-215, on average <180 o ASCVD risk reduction: rosuvastatin 40 mg daily, last LDL at goal ~70; ASA 81 mg daily   Pharmacist Clinical Goal(s):  Marland Kitchen Over the next 90 days, patient will work with PharmD and provider towards optimized medication management  Interventions: . Comprehensive medication review performed; medication list updated in electronic medical record . Inter-disciplinary care team collaboration (see longitudinal plan of care) . Continue metformin 500 mg BID. Start Ozempic 0.25 mg once weekly x 4 weeks, then increase to 0.5 mg weekly. Will provide sample.  . Decrease glipizide to 5 mg BID to reduce risk of hypoglycemia w/ Ozempic start . Will pursue Novo patient assistance for Ozempic, as well as better basal/bolus insulin regimen. Will prepare provider portions for Dr. Darrick Huntsman to sign. When patient comes to pick up Ozempic sample, she will drop off [employer] paperwork, income information, and Midwife. Once all parts received, will pass to [employer], CPhT for submission and follow up. . Ideally, will be able to significantly reduce insulin and glipizide burden to reduce risk of weight gain  and hypoglycemia.   Patient Self Care Activities:  . Patient will take medications as prescribed  Please see past updates related to this goal by clicking on the "Past Updates" button in the selected goal         Fall Risk Fall Risk  02/21/2020 01/12/2020 10/10/2019 09/14/2019 02/18/2019  Falls in the past  year? 0 0 0 0 0  Number falls in past yr: 0 - - 0 -  Comment - - - No falls over last year reported -  Injury with Fall? - - - 0 -  Comment - - - N/A- no falls reported -  Risk for fall due to : - - - Medication side effect;Other (Comment) -  Risk for fall due to: Comment - - - continuous home O2 -  Follow up Falls evaluation completed Falls evaluation completed Falls evaluation completed Falls prevention discussed -   Is the patient's home free of loose throw rugs in walkways, pet beds, electrical cords, etc?   Yes       Handrails on the stairs?  Yes      Adequate lighting?   Yes  Timed Get Up and Go performed: No, virtual visit  Depression Screen PHQ 2/9 Scores 02/21/2020 09/14/2019 02/18/2019 02/16/2018  PHQ - 2 Score 0 0 0 0  PHQ- 9 Score - - - -     Cognitive Function MMSE - Mini Mental State Exam 02/16/2018  Orientation to time 5  Orientation to Place 5  Registration 3  Attention/ Calculation 5  Recall 3  Language- name 2 objects 2  Language- repeat 1  Language- follow 3 step command 3  Language- read & follow direction 1  Write a sentence 1  Copy design 1  Total score 30     6CIT Screen 02/21/2020 02/18/2019 08/08/2016  What Year? 0 points 0 points 0 points  What month? 0 points 0 points 0 points  What time? - 0 points 0 points  Count back from 20 - 0 points 0 points  Months in reverse 0 points 0 points 0 points  Repeat phrase 0 points - 0 points  Total Score - - 0    Immunization History  Administered Date(s) Administered  . Hepatitis B 01/21/2012  . Influenza Whole 07/10/2011, 08/31/2012  . Influenza,inj,Quad PF,6+ Mos 06/12/2014, 10/06/2016, 09/16/2018  . Pneumococcal Conjugate-13 06/12/2014  . Pneumococcal Polysaccharide-23 12/08/2011  . Tdap 12/22/2005   Screening Tests Health Maintenance  Topic Date Due  . Hepatitis C Screening  Never done  . TETANUS/TDAP  12/23/2015  . OPHTHALMOLOGY EXAM  01/29/2017  . MAMMOGRAM  08/19/2018  . URINE MICROALBUMIN   03/24/2019  . DEXA SCAN  Never done  . PNA vac Low Risk Adult (2 of 2 - PPSV23) 07/31/2019  . COVID-19 Vaccine (1) 03/08/2020 (Originally 07/30/1968)  . INFLUENZA VACCINE  04/08/2020  . HEMOGLOBIN A1C  07/21/2020  . COLONOSCOPY  09/25/2020  . FOOT EXAM  01/11/2021  . HIV Screening  Completed      Plan:   Keep all routine maintenance appointments.   Follow up with CCM 02/24/20 @ 10:00  Dexa Scan- consent given  Hep C Screening - consent given  Medicare Attestation I have personally reviewed: The patient's medical and social history Their use of alcohol, tobacco or illicit drugs Their current medications and supplements The patient's functional ability including ADLs,fall risks, home safety risks, cognitive, and hearing and visual impairment Diet and physical activities Evidence for depression   I have reviewed  and discussed with patient certain preventive protocols, quality metrics, and best practice recommendations.      Ashok PallOBrien-Blaney, Madyson Lukach L, LPN  1/61/09606/15/2021

## 2020-02-23 ENCOUNTER — Other Ambulatory Visit: Payer: Self-pay | Admitting: Pharmacy Technician

## 2020-02-23 DIAGNOSIS — I10 Essential (primary) hypertension: Secondary | ICD-10-CM | POA: Diagnosis not present

## 2020-02-23 DIAGNOSIS — E782 Mixed hyperlipidemia: Secondary | ICD-10-CM | POA: Diagnosis not present

## 2020-02-23 DIAGNOSIS — J449 Chronic obstructive pulmonary disease, unspecified: Secondary | ICD-10-CM | POA: Diagnosis not present

## 2020-02-23 DIAGNOSIS — R002 Palpitations: Secondary | ICD-10-CM | POA: Diagnosis not present

## 2020-02-23 DIAGNOSIS — I2510 Atherosclerotic heart disease of native coronary artery without angina pectoris: Secondary | ICD-10-CM | POA: Diagnosis not present

## 2020-02-23 NOTE — Patient Outreach (Signed)
[facility]) Care Management  02/23/2020  Cherylene Ferrufino Feb 13, 1956 [id number]  ADDENDUM   Successful call placed to patient regarding patient assistance medication delivery of Breztri from [employer], receipt of attestation form from [employer] for [employer], and update on [employer] delivery of Tresiba, Novolog & Ozempic, HIPAA identifiers verified.   Patient confirmed receiving the Breztri from the patient assistance company. Discussed refill procedure with patient which will require patient to call [employer] when she opens the last inhaler box. Informed patient where to find the phone number to call,  either in the paperwork that came with the package or on the pharmacy label on the inhaler box. Patient was able to locate the number while on the line.  Patient also informed receiving the attestation form that [employer] mailed to her but has not completed it because she thought she did not qualify. Informed patient that is how the letter seems but she has to complete the attestation form. Patient informed she would complete the attestation form and place in the mail this week.  Lastly, updated patient that her medications with Novo Nordisk will hopefully be on the way soon but that [employer] was having shipping delays currently.Patient verbalized understanding.  Will follow up with [employer] and patient in 10-14 business days to inquire on application status with [employer] and inquire if patient received the medications from [employer].  Quyen Cutsforth P. Makai Dumond, CPhT [facility]  [phone]

## 2020-02-23 NOTE — Patient Outreach (Signed)
[facility]) Care Management  02/23/2020  Catherine [name] [birth year] [id number]  Care coordination call placed to [employer] in regards to patient's Proventil HFA application.  Spoke to [place] who informed patient's application was received on 02/14/2020 and the attestation form was mailed to her on 02/15/2020.  Will follow up with patient to inquire if she has received the attestation form.  [name] P. Eluzer Howdeshell, CPhT [facility] [employer]  [phone]

## 2020-02-24 ENCOUNTER — Telehealth: Payer: Self-pay | Admitting: Pharmacist

## 2020-02-24 ENCOUNTER — Ambulatory Visit (INDEPENDENT_AMBULATORY_CARE_PROVIDER_SITE_OTHER): Payer: Medicare HMO | Admitting: Pharmacist

## 2020-02-24 DIAGNOSIS — E118 Type 2 diabetes mellitus with unspecified complications: Secondary | ICD-10-CM | POA: Diagnosis not present

## 2020-02-24 DIAGNOSIS — I10 Essential (primary) hypertension: Secondary | ICD-10-CM

## 2020-02-24 DIAGNOSIS — J449 Chronic obstructive pulmonary disease, unspecified: Secondary | ICD-10-CM | POA: Diagnosis not present

## 2020-02-24 DIAGNOSIS — J432 Centrilobular emphysema: Secondary | ICD-10-CM

## 2020-02-24 DIAGNOSIS — E1165 Type 2 diabetes mellitus with hyperglycemia: Secondary | ICD-10-CM | POA: Diagnosis not present

## 2020-02-24 DIAGNOSIS — IMO0002 Reserved for concepts with insufficient information to code with codable children: Secondary | ICD-10-CM

## 2020-02-24 DIAGNOSIS — G839 Paralytic syndrome, unspecified: Secondary | ICD-10-CM | POA: Diagnosis not present

## 2020-02-24 NOTE — Patient Instructions (Signed)
Visit Information  Goals Addressed              This Visit's Progress     Patient Stated   .  PharmD "I can't afford my medications" (pt-stated)        CARE PLAN ENTRY (see longtitudinal plan of care for additional care plan information)  Current Barriers:  . Polypharmacy; complex patient with multiple comorbidities including COPD (hx tobacco use); CAD, HTN, T2DM . Financial concerns: Patient reports she is unabe to afford brand name medications, including inhalers and diabetes medications. Working on resolving via Theatre manager o T2DM: last A1c 7.9%; metformin 500 mg BID, Ozempic 0.25 mg weekly (x3 weeks) - reports she has not needed any Novolin since starting Ozempic. Glipizide 5 mg QAM, 10 mg QPM. Reports she has already ost ~10 lbs since starting Ozempic - APPROVED for Tresiba, Novolog, and Ozempic assistance through 08/07/20 - Current glucose readings: [place] 120-150s (excursion to 200 when on prednisone); 2 hour post supper readings 120-180s o COPD: Breztri 2 puffs BID, started a few days ago. Endorses significant improvement in breathing, has required few doses of albuterol HFA and has not required a nebulizer tx - APPROVED for Breztri assistance through 09/07/20 - Working on [employer] assistance through [employer] o HTN: metformin succinate 25 mg daily, lisinopril 10 mg daily just added by Dr. Welton Flakes. ECHO showed LVEF 50-55%; Patient reports home readings SBP generally 130s, occasionally 160s o ASCVD risk reduction: rosuvastatin 40 mg daily, last LDL at goal ~70; ASA 81 mg daily   Pharmacist Clinical Goal(s):  Marland Kitchen Over the next 90 days, patient will work with PharmD and provider towards optimized medication management  Interventions: . Comprehensive medication review performed; medication list updated in electronic medical record . Inter-disciplinary care team collaboration (see longitudinal plan of care) . Reviewed Archivist for [employer]. Reviewed  refill procedure. Praised patient for improvement in breathing w/ addition of appropriate pharmacotherapy . Praised patient for significant improvement in diabetic control w/ Ozempic. Incredibly pleased that patient is not requiring insulin therapy and sugars are fairly well controlled; will improve with titration of Ozempic next week. Counseled to trial reduction/elimination of glipizide next, once Ozempic dose is increased. Likely won't require Novolog moving forward, will follow to see if Evaristo Bury is going to be required, pending tolerability of Ozempic titration . Reviewed [employer] approval for medications. Reviewed delay in shipping. Will prepare a sample of Ozempic in case her order does not arrive in the next 3 weeks.  . Discussed BP goals, and that continued weight loss will likely help BP as well.  Patient Self Care Activities:  . Patient will take medications as prescribed  Please see past updates related to this goal by clicking on the "Past Updates" button in the selected goal         The patient verbalized understanding of instructions provided today and declined a print copy of patient instruction materials.   Plan:  - Scheduled f/u call in ~ 6 weeks  [name], PharmD, [facility] Clinical Pharmacist [facility] [phone]

## 2020-02-24 NOTE — Chronic Care Management (AMB) (Signed)
Chronic Care Management   Follow Up Note   02/24/2020 Name: Catherine [name] MRN: [id number] DOB: 1956/04/09  Referred by: Sherlene Shams, MD Reason for referral : Chronic Care Management (Medication Management)   Catherine [name] is a 64 y.o. year old female who is a primary care patient of [name], [name], MD. The CCM team was consulted for assistance with chronic disease management and care coordination needs.    Review of patient status, including review of consultants reports, relevant laboratory and other test results, and collaboration with appropriate care team members and the patient's provider was performed as part of comprehensive patient evaluation and provision of chronic care management services.    SDOH (Social Determinants of Health) assessments performed: Yes See Care Plan activities for detailed interventions related to SDOH)  SDOH Interventions     Most Recent Value  SDOH Interventions  Financial Strain Interventions Other (Comment)  [paitent assistance applications]       Outpatient Encounter Medications as of 02/24/2020  Medication Sig Note  . aspirin 81 MG EC tablet Take 81 mg by mouth daily.     . Budeson-Glycopyrrol-Formoterol (BREZTRI AEROSPHERE) 160-9-4.8 MCG/ACT AERO Inhale 2 puffs into the lungs in the morning and at bedtime.   . Cholecalciferol (VITAMIN D3) 125 MCG (5000 UT) CAPS Take 1,000 Units by mouth daily.    Marland Kitchen glipiZIDE (GLUCOTROL) 5 MG tablet Take 1 tablet (5 mg total) by mouth 2 (two) times daily before a meal.   . ipratropium-albuterol (DUONEB) 0.5-2.5 (3) MG/3ML SOLN USE 1 VIAL VIA NEBULIZER EVERY 6 HOURS AS NEEDED   . Lactobacillus (PROBIOTIC ACIDOPHILUS PO) Take 1 capsule by mouth daily.   . Lactulose 20 GM/30ML SOLN 30 ml every 4 hours until constipation is relieved   . lisinopril (ZESTRIL) 10 MG tablet Take 10 mg by mouth daily.   . magnesium oxide (MAG-OX) 400 MG tablet Take 400 mg by mouth every other day.   . Melatonin 5 MG TABS  Take 5 mg by mouth at bedtime.   . metFORMIN (GLUCOPHAGE) 500 MG tablet Take 1 tablet (500 mg total) by mouth 2 (two) times daily with a meal.   . metoprolol succinate (TOPROL-XL) 25 MG 24 hr tablet Take 1 tablet (25 mg total) by mouth daily.   . ondansetron (ZOFRAN ODT) 4 MG disintegrating tablet Take 1 tablet (4 mg total) by mouth every 8 (eight) hours as needed for nausea or vomiting.   Marland Kitchen PARoxetine (PAXIL) 10 MG tablet TAKE 1 TABLET(10 MG) BY MOUTH EVERY MORNING   . polyethylene glycol (MIRALAX / GLYCOLAX) 17 g packet Take 17 g by mouth 2 (two) times daily.   . Potassium 99 MG TABS Take 99 mg by mouth daily. Reported on 09/20/2015   . promethazine (PHENERGAN) 12.5 MG tablet TAKE 1 TABLET (12.5 MG TOTAL) BY MOUTH AT BEDTIME AS NEEDED FOR NAUSEA OR VOMITING. 12/19/2019: Taking 2-3 times weekly  . rosuvastatin (CRESTOR) 40 MG tablet TAKE 1 TABLET BY MOUTH EVERY DAY   . Semaglutide,0.25 or 0.5MG /DOS, (OZEMPIC, 0.25 OR 0.5 MG/DOSE,) 2 MG/1.5ML SOPN Inject 0.5 mg into the skin once a week. 02/24/2020: 0.25 mg x3 weeks  . acetaminophen (TYLENOL) 325 MG tablet Take 650 mg by mouth every 6 (six) hours as needed. PRN headache   . albuterol (VENTOLIN HFA) 108 (90 Base) MCG/ACT inhaler USE 2 PUFFS EVERY 6 HOURS AS NEEDED FOR WHEEZING (Patient not taking: Reported on 02/24/2020)   . insulin aspart (NOVOLOG) 100 UNIT/ML injection Inject 6 Units into  the skin 3 (three) times daily before meals. (Patient not taking: Reported on 02/24/2020)   . insulin degludec (TRESIBA FLEXTOUCH) 100 UNIT/ML FlexTouch Pen Inject 0.3 mLs (30 Units total) into the skin daily. (Patient not taking: Reported on 02/24/2020)   . [DISCONTINUED] butalbital-acetaminophen-caffeine (FIORICET) 50-325-40 MG tablet Take 1 tablet by mouth every 6 (six) hours as needed for headache. (Patient not taking: Reported on 02/24/2020)   . [DISCONTINUED] NOVOLIN 70/30 RELION (70-30) 100 UNIT/ML injection INJECT 15 UNITS SUBCUTANEOUSLY BEFORE BREAKFAST AND 20  UNITS BEFORE EVENING MEAL ADJUST WEEKLY PER MD (Patient not taking: Reported on 02/24/2020)    No facility-administered encounter medications on file as of 02/24/2020.     Objective:   Goals Addressed              This Visit's Progress     Patient Stated   .  PharmD "I can't afford my medications" (pt-stated)        CARE PLAN ENTRY (see longtitudinal plan of care for additional care plan information)  Current Barriers:  . Polypharmacy; complex patient with multiple comorbidities including COPD (hx tobacco use); CAD, HTN, T2DM . Financial concerns: Patient reports she is unabe to afford brand name medications, including inhalers and diabetes medications. Working on resolving via Financial planner o [employer]: last A1c 7.9%; metformin 500 mg BID, Ozempic 0.25 mg weekly (x3 weeks) - reports she has not needed any Novolin since starting Ozempic. Glipizide 5 mg QAM, 10 mg QPM. Reports she has already ost ~10 lbs since starting Ozempic - APPROVED for Tresiba, Novolog, and Ozempic assistance through 08/07/20 - Current glucose readings: [place] 120-150s (excursion to 200 when on prednisone); 2 hour post supper readings 120-180s o COPD: Breztri 2 puffs BID, started a few days ago. Endorses significant improvement in breathing, has required few doses of albuterol HFA and has not required a nebulizer tx - APPROVED for Breztri assistance through 09/07/20 - Working on [employer] assistance through [employer] o HTN: metformin succinate 25 mg daily, lisinopril 10 mg daily just added by Dr. Humphrey Rolls. ECHO showed LVEF 50-55%; Patient reports home readings SBP generally 130s, occasionally 160s o ASCVD risk reduction: rosuvastatin 40 mg daily, last LDL at goal ~70; ASA 81 mg daily   Pharmacist Clinical Goal(s):  Marland Kitchen Over the next 90 days, patient will work with PharmD and provider towards optimized medication management  Interventions: . Comprehensive medication review performed; medication list updated  in electronic medical record . Inter-disciplinary care team collaboration (see longitudinal plan of care) . Reviewed Oncologist for [employer]. Reviewed refill procedure. Praised patient for improvement in breathing w/ addition of appropriate pharmacotherapy . Praised patient for significant improvement in diabetic control w/ Ozempic. Incredibly pleased that patient is not requiring insulin therapy and sugars are fairly well controlled; will improve with titration of Ozempic next week. Counseled to trial reduction/elimination of glipizide next, once Ozempic dose is increased. Likely won't require Novolog moving forward, will follow to see if [name] is going to be required, pending tolerability of Ozempic titration . Reviewed [employer] approval for medications. Reviewed delay in shipping. Will prepare a sample of Ozempic in case her order does not arrive in the next 3 weeks.  . Discussed BP goals, and that continued weight loss will likely help BP as well.  Patient Self Care Activities:  . Patient will take medications as prescribed  Please see past updates related to this goal by clicking on the "Past Updates" button in the selected goal  Plan:  - Scheduled f/u call in ~ 6 weeks  [name], PharmD, [facility] Clinical Pharmacist [facility] [phone]

## 2020-02-24 NOTE — Telephone Encounter (Signed)
Medication Samples have been pulled, labeled, and logged for the patient.  Drug: Ozempic Strength: 0.25/0.5 mg  Qty: 1 pen  LOT: [id number]  Exp.Date: 06/2022  Patient notified to come pick up at her convenience

## 2020-03-06 ENCOUNTER — Other Ambulatory Visit: Payer: Self-pay | Admitting: Pharmacy Technician

## 2020-03-06 ENCOUNTER — Other Ambulatory Visit: Payer: Self-pay | Admitting: Internal Medicine

## 2020-03-06 DIAGNOSIS — R69 Illness, unspecified: Secondary | ICD-10-CM | POA: Diagnosis not present

## 2020-03-06 NOTE — Patient Outreach (Signed)
[facility]) Care Management  03/06/2020  Catherine [name] 03-Feb-1956 [id number]   Care coordination call placed to [employer] in regards to patient's application for Proventil HFA.  Spoke to Graham who informed they have not received back the attestation form the patient informs she mailed back on 02/23/2020.  Will follow up with [employer] in 7-10 business days to confirm attestation form was received.  Shay Bartoli P. [name], CPhT [facility]  [phone]

## 2020-03-14 ENCOUNTER — Other Ambulatory Visit: Payer: Self-pay | Admitting: Pharmacy Technician

## 2020-03-14 ENCOUNTER — Telehealth: Payer: Self-pay

## 2020-03-14 NOTE — Patient Outreach (Signed)
[facility]) Care Management  03/14/2020  Catherine [name] August 08, 1956 [id number]  Care coordination call placed to [employer] in regards to delivery status of patient's Ozempic, Tresiba and Novolog.  Spoke to Catherine [name] who informed the Ozepmic was delivered today by [employer]. The tracking number is [phone]. He informed the other 2 medications have not shipped but were processed on the 14th of June.  He informs they are running between 5-10 business days behind the normal 10-14 business day turn around time. However, Catherine [name] was unable to give an exact date of when the medications may be delivered.  Will follow up with [employer] in 7-10 business days to inquire if medications have shipped/delivered.  Catherine [name] P. Oleva Koo, CPhT [facility]  [phone]

## 2020-03-14 NOTE — Telephone Encounter (Signed)
Received patient assistance Ozempic. Pt is aware and stated that she would try to get by here tomorrow to pick up.

## 2020-03-15 ENCOUNTER — Other Ambulatory Visit: Payer: Self-pay | Admitting: Pharmacy Technician

## 2020-03-15 NOTE — Telephone Encounter (Signed)
Sample returned to stock, as PAP arrived

## 2020-03-15 NOTE — Patient Outreach (Signed)
[facility]) Care Management  03/15/2020  Catherine [name] Feb 18, 1956 [id number]  Two care coordination calls placed to patient assistance companies.  First call placed to [employer] patient assistance for patient's Proventil application.  Spoke to Catherine [name] who informed they have not received back the attestation form that the patient reported as mailing back on 02/23/2020. Catherine [name] informed they were closed 1.5 days for the 4th of July holiday and therefore processing has been delayed. She suggested calling back next week.  Second care coordination call placed to the automated system at [employer] to check on patient's shipment of [place] and Novolog. Per the automated system, the medications are shipping today 03/15/2020 with a tracking number of [phone].  Will follow up with patient and [employer] in 5-7 business days to confirm medication/attestation form was received.  Estanislado Surgeon P. Maymie Brunke, CPhT [facility]  [phone]

## 2020-03-22 ENCOUNTER — Other Ambulatory Visit: Payer: Self-pay | Admitting: Pharmacy Technician

## 2020-03-22 NOTE — Patient Outreach (Signed)
[facility]) Care Management  03/22/2020  Jakelin Taussig May 02, 1956 [id number]  Successful outreach call placed to patient in regards to the delivery status of Tresiba and Novolog with [employer] and attestation form with [employer].  Spoke to patient, HIPAA identifiers verified.  Patient informs she has picked up the Ozempic but not the Novolog or [place] as she did not know they had been delivered to the office.  Informed patient based on the tracking number [employer] representative gave me on 03/14/2020, the medications were delivered on 03/16/2020. She informed she would have her son pick them up tomorrow.  Inquired about the attestation form and if patient had filled it out and mailed it back to [employer] for the Proventil HFA. Patient informed she has not mailed it back .She informed she had misplaced it but recently found it again. Discussed the form with the patient again and what needed to be sent back with the form to [employer]. Patient verbalized understanding and  informed she would complete the form today and place in mail tomorrow.  Will follow up with [employer] in 15-20 business days to confirm they received the attestation form.  Arseniy Toomey P. Phoenix Riesen, CPhT [facility]  [phone]

## 2020-03-23 NOTE — Telephone Encounter (Signed)
Pt's husband came by the office today and picked up all of pt's patient assistance medication.

## 2020-03-25 DIAGNOSIS — G839 Paralytic syndrome, unspecified: Secondary | ICD-10-CM | POA: Diagnosis not present

## 2020-03-25 DIAGNOSIS — J449 Chronic obstructive pulmonary disease, unspecified: Secondary | ICD-10-CM | POA: Diagnosis not present

## 2020-03-26 DIAGNOSIS — R69 Illness, unspecified: Secondary | ICD-10-CM | POA: Diagnosis not present

## 2020-03-29 ENCOUNTER — Other Ambulatory Visit: Payer: Self-pay | Admitting: Internal Medicine

## 2020-04-05 ENCOUNTER — Other Ambulatory Visit: Payer: Self-pay

## 2020-04-05 ENCOUNTER — Telehealth: Payer: Medicare HMO | Admitting: Internal Medicine

## 2020-04-05 ENCOUNTER — Telehealth: Payer: Self-pay | Admitting: Internal Medicine

## 2020-04-05 ENCOUNTER — Telehealth (INDEPENDENT_AMBULATORY_CARE_PROVIDER_SITE_OTHER): Payer: Medicare HMO | Admitting: Internal Medicine

## 2020-04-05 ENCOUNTER — Encounter: Payer: Self-pay | Admitting: Internal Medicine

## 2020-04-05 VITALS — Ht 63.0 in | Wt 268.0 lb

## 2020-04-05 DIAGNOSIS — R509 Fever, unspecified: Secondary | ICD-10-CM | POA: Diagnosis not present

## 2020-04-05 DIAGNOSIS — B373 Candidiasis of vulva and vagina: Secondary | ICD-10-CM | POA: Diagnosis not present

## 2020-04-05 DIAGNOSIS — J441 Chronic obstructive pulmonary disease with (acute) exacerbation: Secondary | ICD-10-CM | POA: Diagnosis not present

## 2020-04-05 DIAGNOSIS — B3731 Acute candidiasis of vulva and vagina: Secondary | ICD-10-CM

## 2020-04-05 MED ORDER — PREDNISONE 20 MG PO TABS: 40.0000 mg | ORAL_TABLET | Freq: Every day | ORAL | 0 refills | Status: DC

## 2020-04-05 MED ORDER — FLUCONAZOLE 150 MG PO TABS: 150.0000 mg | ORAL_TABLET | Freq: Once | ORAL | 0 refills | Status: AC

## 2020-04-05 MED ORDER — AMOXICILLIN-POT CLAVULANATE 875-125 MG PO TABS: 1.0000 | ORAL_TABLET | Freq: Two times a day (BID) | ORAL | 0 refills | Status: DC

## 2020-04-05 NOTE — Addendum Note (Signed)
Addended by: Quentin Ore on: 04/05/2020 03:06 PM   Modules accepted: Level of Service

## 2020-04-05 NOTE — Progress Notes (Signed)
Telephone Note  I connected with Catherine [name]  on 04/05/20 at  2:30 PM EDT by telephone and verified that I am speaking with the correct person using two identifiers.  Location patient: home Location provider:work or home office Persons participating in the virtual visit: patient, provider, lives with family   I discussed the limitations of evaluation and management by telemedicine and the availability of in person appointments. The patient expressed understanding and agreed to proceed.   HPI: COPD exacerbation. Family over the weekend went to [facility] and came back sick with URI sx's tested covid and negative. She ran fever 101 F on Tuesday x 1. Coughing clear to yellow tried lemon [name] and mucinex dm green label. She has copd on 4L continous o2 tried duoneb prn and today only done 1x b/c she has been asleep. Also has prn albuterol, duoneb and Bretzi 2 puffs bid.  Throat is sore due to coughing but no redness or white exudate  She is not a smoker and no second hand smoke    ROS: See pertinent positives and negatives per HPI.  Past Medical History:  Diagnosis Date  . Anxiety   . Asthma   . COPD (chronic obstructive pulmonary disease) (HCC)   . Depression   . Diabetes mellitus   . Gastritis and duodenitis June 2011   EGD  . GERD (gastroesophageal reflux disease)   . Hyperglycemia   . Hyperlipidemia   . Hypertension   . Leukocytosis   . Obesity (BMI 30-39.9)   . S/P cardiac catheterization March 2012   normal coronaries,  due to chest pain ([place])  . Screening for breast cancer Jul 28 2011   normal  . Screening for colon cancer June 2011   polyps found, next one due 2014 ([place])  . Tobacco abuse, in remission    quit oct during hospitalization     Past Surgical History:  Procedure Laterality Date  . ABDOMINAL HYSTERECTOMY    . ABDOMINAL HYSTERECTOMY    . BILATERAL OOPHORECTOMY  1995   and TAH.  noncancerous reasons  . CARDIAC CATHETERIZATION  11/22/2010   no  significant disease    Family History  Problem Relation Age of Onset  . Coronary artery disease Mother 68  . Heart disease Mother        CABG x5  . COPD Mother 75  . Breast cancer Mother   . Kidney disease Mother   . Heart failure Father   . Angina Father   . Diabetes Father   . Depression Father   . Stroke Father   . Dementia Father   . Asthma Brother   . Asthma Brother   . Diabetes Brother   . Hypertension Brother   . Cancer Sister        breast, lung, brain    SOCIAL HX: lives with family    Current Outpatient Medications:  .  acetaminophen (TYLENOL) 325 MG tablet, Take 650 mg by mouth every 6 (six) hours as needed. PRN headache, Disp: , Rfl:  .  albuterol (VENTOLIN HFA) 108 (90 Base) MCG/ACT inhaler, USE 2 PUFFS EVERY 6 HOURS AS NEEDED FOR WHEEZING, Disp: 8 g, Rfl: 3 .  aspirin 81 MG EC tablet, Take 81 mg by mouth daily.  , Disp: , Rfl:  .  Budeson-Glycopyrrol-Formoterol (BREZTRI AEROSPHERE) 160-9-4.8 MCG/ACT AERO, Inhale 2 puffs into the lungs in the morning and at bedtime., Disp: , Rfl:  .  Cholecalciferol (VITAMIN D3) 125 MCG (5000 UT) CAPS, Take 1,000  Units by mouth daily. , Disp: , Rfl:  .  glipiZIDE (GLUCOTROL) 5 MG tablet, Take 1 tablet (5 mg total) by mouth 2 (two) times daily before a meal., Disp: 60 tablet, Rfl: 1 .  insulin aspart (NOVOLOG) 100 UNIT/ML injection, Inject 6 Units into the skin 3 (three) times daily before meals., Disp: 22 mL, Rfl: 1 .  insulin degludec (TRESIBA FLEXTOUCH) 100 UNIT/ML FlexTouch Pen, Inject 0.3 mLs (30 Units total) into the skin daily., Disp: 36 mL, Rfl: 2 .  ipratropium-albuterol (DUONEB) 0.5-2.5 (3) MG/3ML SOLN, USE 1 VIAL VIA NEBULIZER EVERY 6 HOURS AS NEEDED, Disp: 270 mL, Rfl: 2 .  Lactobacillus (PROBIOTIC ACIDOPHILUS PO), Take 1 capsule by mouth daily., Disp: , Rfl:  .  lactulose (CHRONULAC) 10 GM/15ML solution, 30 ML EVERY 4 HOURS UNTIL CONSTIPATION IS RELIEVED, Disp: 473 mL, Rfl: 1 .  lisinopril (ZESTRIL) 10 MG tablet, Take  10 mg by mouth daily., Disp: , Rfl:  .  magnesium oxide (MAG-OX) 400 MG tablet, Take 400 mg by mouth every other day., Disp: , Rfl:  .  Melatonin 5 MG TABS, Take 5 mg by mouth at bedtime., Disp: , Rfl:  .  metFORMIN (GLUCOPHAGE) 500 MG tablet, Take 1 tablet (500 mg total) by mouth 2 (two) times daily with a meal., Disp: 180 tablet, Rfl: 1 .  metoprolol succinate (TOPROL-XL) 25 MG 24 hr tablet, Take 1 tablet (25 mg total) by mouth daily., Disp: 90 tablet, Rfl: 3 .  ondansetron (ZOFRAN ODT) 4 MG disintegrating tablet, Take 1 tablet (4 mg total) by mouth every 8 (eight) hours as needed for nausea or vomiting., Disp: 20 tablet, Rfl: 0 .  PARoxetine (PAXIL) 10 MG tablet, TAKE 1 TABLET(10 MG) BY MOUTH EVERY MORNING, Disp: 90 tablet, Rfl: 1 .  polyethylene glycol (MIRALAX / GLYCOLAX) 17 g packet, Take 17 g by mouth 2 (two) times daily., Disp: , Rfl:  .  Potassium 99 MG TABS, Take 99 mg by mouth daily. Reported on 09/20/2015, Disp: , Rfl:  .  promethazine (PHENERGAN) 12.5 MG tablet, TAKE 1 TABLET (12.5 MG TOTAL) BY MOUTH AT BEDTIME AS NEEDED FOR NAUSEA OR VOMITING., Disp: 30 tablet, Rfl: 0 .  rosuvastatin (CRESTOR) 40 MG tablet, TAKE 1 TABLET BY MOUTH EVERY DAY, Disp: 90 tablet, Rfl: 3 .  Semaglutide,0.25 or 0.5MG /DOS, (OZEMPIC, 0.25 OR 0.5 MG/DOSE,) 2 MG/1.5ML SOPN, Inject 0.5 mg into the skin once a week., Disp: 1 pen, Rfl: 1 .  amoxicillin-clavulanate (AUGMENTIN) 875-125 MG tablet, Take 1 tablet by mouth 2 (two) times daily. With food, Disp: 14 tablet, Rfl: 0 .  fluconazole (DIFLUCAN) 150 MG tablet, Take 1 tablet (150 mg total) by mouth once for 1 dose., Disp: 1 tablet, Rfl: 0 .  predniSONE (DELTASONE) 20 MG tablet, Take 2 tablets (40 mg total) by mouth daily with breakfast., Disp: 14 tablet, Rfl: 0  EXAM:  VITALS per patient if applicable:  GENERAL: alert, oriented, appears well and in no acute distress  LUNGS: +coughing on the phone, able to speak in full sentences no signs of respiratory distress,  breathing rate appears normal, no obvious gross SOB, gasping or wheezing  PSYCH/NEURO: pleasant and cooperative, no obvious depression or anxiety, speech and thought processing grossly intact  ASSESSMENT AND PLAN:  Discussed the following assessment and plan:  COPD exacerbation (HCC) - Plan: predniSONE (DELTASONE) 40 MG tablet qd x 1 week, amoxicillin-clavulanate (AUGMENTIN) 875-125 MG tablet bid x 1 week Prn duoneb up to tid  Prn albuterol  breztri 2  pf bid  Cont 4 L continuous  rec pulmonary consult if not improved as she is on continuous O2 for copd   Fever, unspecified fever cause resolved   Yeast vaginitis - Plan: fluconazole (DIFLUCAN) 150 MG tablet  -we discussed possible serious and likely etiologies, options for evaluation and workup, limitations of telemedicine visit vs in person visit, treatment, treatment risks and precautions. Pt prefers to treat via telemedicine empirically rather then risking or undertaking an in person visit at this moment. Patient agrees to seek prompt in person care if worsening, new symptoms arise, or if is not improving with treatment.   I discussed the assessment and treatment plan with the patient. The patient was provided an opportunity to ask questions and all were answered. The patient agreed with the plan and demonstrated an understanding of the instructions.   The patient was advised to call back or seek an in-person evaluation if the symptoms worsen or if the condition fails to improve as anticipated.  Time spent 30 minutes Bevelyn Buckles, MD

## 2020-04-05 NOTE — Progress Notes (Signed)
Patient presenting with cough, fever, and sore throat. Fever peaked at 101. Coughing up yellow phlegm. Pain with cough and swallowing.  Onset of symptoms last Saturday. Whole family has had these same symptoms. States the family went to [place] a few weeks ago.   Her family has tested negative for COVID-19.

## 2020-04-06 ENCOUNTER — Ambulatory Visit (INDEPENDENT_AMBULATORY_CARE_PROVIDER_SITE_OTHER): Payer: Medicare HMO | Admitting: Pharmacist

## 2020-04-06 DIAGNOSIS — J432 Centrilobular emphysema: Secondary | ICD-10-CM | POA: Diagnosis not present

## 2020-04-06 DIAGNOSIS — E118 Type 2 diabetes mellitus with unspecified complications: Secondary | ICD-10-CM | POA: Diagnosis not present

## 2020-04-06 DIAGNOSIS — E1165 Type 2 diabetes mellitus with hyperglycemia: Secondary | ICD-10-CM

## 2020-04-06 DIAGNOSIS — IMO0002 Reserved for concepts with insufficient information to code with codable children: Secondary | ICD-10-CM

## 2020-04-06 NOTE — Patient Instructions (Signed)
Visit Information  Goals Addressed              This Visit's Progress     Patient Stated   .  PharmD "I can't afford my medications" (pt-stated)        CARE PLAN ENTRY (see longtitudinal plan of care for additional care plan information)  Current Barriers:  . Social, financial, and community barriers: o Tired today, seen in office yesterday for COPD exacerbation.  . Polypharmacy; complex patient with multiple comorbidities including COPD (hx tobacco use); CAD, HTN, T2DM o T2DM: last A1c 7.9%; metformin 500 mg BID, Ozempic 0.5 mg weekly- notes that she misunderstood instructions and has been doing 0.5 mg x4 weeks, then 0.25 mg x 4 weeks; Tresiba 30 units daily, Novolog 6 units TID w/ meals, though has been using up to 10 for high carb meals; glipizide 5 mg QAM, 10 mg QPM - APPROVED for Tresiba, Novolog, and Ozempic assistance through 08/07/20 - DENIES any episodes of hypoglycemia  - Reports that she has not been checking glucose readings since her COPD excerbation o COPD: Breztri 2 puffs BID, Proventil HFA PRN - reports that prior to COPD exacerbation, she had not needed any PRN albuterol. Reports she just received 3 inhalers from [employer] assistance  - APPROVED for [employer] Vera assistance through 09/07/20 - APPROVED for Proventil HFA through 09/07/20 o HTN: metformin succinate 25 mg daily, lisinopril 10 mg daily just added by Dr. Welton Flakes. ECHO showed LVEF 50-55%; o ASCVD risk reduction: rosuvastatin 40 mg daily, last LDL at goal ~70; ASA 81 mg daily   Pharmacist Clinical Goal(s):  Marland Kitchen Over the next 90 days, patient will work with PharmD and provider towards optimized medication management  Interventions: . Comprehensive medication review performed; medication list updated in electronic medical record . Inter-disciplinary care team collaboration (see longitudinal plan of care) . Reviewed refill procedure for Proventil through [employer] assistance.  . Discussed risks of hypoglycemia w/ glipizide and  Novolog. After completion of prednisone for COPD exacerbation, discontinue glipizide. Patient verbalized understanding . Reviewed that she should continue Ozempic 0.5 mg indefinitely, Ozempic 0.25 mg was just the starter dose. She verbalized understanding.  . Encouraged to re-start checking SMBG TID. Patient verbalized understanding.   Patient Self Care Activities:  . Patient will take medications as prescribed  Please see past updates related to this goal by clicking on the "Past Updates" button in the selected goal         The patient verbalized understanding of instructions provided today and declined a print copy of patient instruction materials.   Plan:  - Scheduled f/u call in ~ 3 weeks  [name], PharmD, [place], CPP Clinical Pharmacist [facility] [employer] [phone]

## 2020-04-06 NOTE — Chronic Care Management (AMB) (Signed)
Chronic Care Management   Follow Up Note   04/06/2020 Name: Catherine [name] MRN: [id number] DOB: May 09, 1956  Referred by: Sherlene Shams, MD Reason for referral : Chronic Care Management (Medication Management)   Catherine [name] is a 64 y.o. year old female who is a primary care patient of [name], [name], MD. The CCM team was consulted for assistance with chronic disease management and care coordination needs.    Contacted patient for medication management review.   Review of patient status, including review of consultants reports, relevant laboratory and other test results, and collaboration with appropriate care team members and the patient's provider was performed as part of comprehensive patient evaluation and provision of chronic care management services.    SDOH (Social Determinants of Health) assessments performed: Yes See Care Plan activities for detailed interventions related to SDOH)  SDOH Interventions     Most Recent Value  SDOH Interventions  Financial Strain Interventions Other (Comment)  [medication assistance]       Outpatient Encounter Medications as of 04/06/2020  Medication Sig Note  . albuterol (VENTOLIN HFA) 108 (90 Base) MCG/ACT inhaler USE 2 PUFFS EVERY 6 HOURS AS NEEDED FOR WHEEZING   . amoxicillin-clavulanate (AUGMENTIN) 875-125 MG tablet Take 1 tablet by mouth 2 (two) times daily. With food   . aspirin 81 MG EC tablet Take 81 mg by mouth daily.     . Budeson-Glycopyrrol-Formoterol (BREZTRI AEROSPHERE) 160-9-4.8 MCG/ACT AERO Inhale 2 puffs into the lungs in the morning and at bedtime.   . Cholecalciferol (VITAMIN D3) 125 MCG (5000 UT) CAPS Take 1,000 Units by mouth daily.    Marland Kitchen glipiZIDE (GLUCOTROL) 5 MG tablet Take 1 tablet (5 mg total) by mouth 2 (two) times daily before a meal.   . insulin aspart (NOVOLOG) 100 UNIT/ML injection Inject 6 Units into the skin 3 (three) times daily before meals.   . insulin degludec (TRESIBA FLEXTOUCH) 100 UNIT/ML  FlexTouch Pen Inject 0.3 mLs (30 Units total) into the skin daily.   Marland Kitchen ipratropium-albuterol (DUONEB) 0.5-2.5 (3) MG/3ML SOLN USE 1 VIAL VIA NEBULIZER EVERY 6 HOURS AS NEEDED   . Lactobacillus (PROBIOTIC ACIDOPHILUS PO) Take 1 capsule by mouth daily.   Marland Kitchen lisinopril (ZESTRIL) 10 MG tablet Take 10 mg by mouth daily.   . magnesium oxide (MAG-OX) 400 MG tablet Take 400 mg by mouth every other day.   . Melatonin 5 MG TABS Take 5 mg by mouth at bedtime.   . metFORMIN (GLUCOPHAGE) 500 MG tablet Take 1 tablet (500 mg total) by mouth 2 (two) times daily with a meal.   . metoprolol succinate (TOPROL-XL) 25 MG 24 hr tablet Take 1 tablet (25 mg total) by mouth daily.   Marland Kitchen PARoxetine (PAXIL) 10 MG tablet TAKE 1 TABLET(10 MG) BY MOUTH EVERY MORNING   . Potassium 99 MG TABS Take 99 mg by mouth daily. Reported on 09/20/2015   . predniSONE (DELTASONE) 20 MG tablet Take 2 tablets (40 mg total) by mouth daily with breakfast.   . rosuvastatin (CRESTOR) 40 MG tablet TAKE 1 TABLET BY MOUTH EVERY DAY   . Semaglutide,0.25 or 0.5MG /DOS, (OZEMPIC, 0.25 OR 0.5 MG/DOSE,) 2 MG/1.5ML SOPN Inject 0.5 mg into the skin once a week.   Marland Kitchen acetaminophen (TYLENOL) 325 MG tablet Take 650 mg by mouth every 6 (six) hours as needed. PRN headache   . lactulose (CHRONULAC) 10 GM/15ML solution 30 ML EVERY 4 HOURS UNTIL CONSTIPATION IS RELIEVED   . ondansetron (ZOFRAN ODT) 4 MG disintegrating tablet Take  1 tablet (4 mg total) by mouth every 8 (eight) hours as needed for nausea or vomiting.   . polyethylene glycol (MIRALAX / GLYCOLAX) 17 g packet Take 17 g by mouth 2 (two) times daily.   . promethazine (PHENERGAN) 12.5 MG tablet TAKE 1 TABLET (12.5 MG TOTAL) BY MOUTH AT BEDTIME AS NEEDED FOR NAUSEA OR VOMITING. 12/19/2019: Taking 2-3 times weekly   No facility-administered encounter medications on file as of 04/06/2020.     Objective:   Goals Addressed              This Visit's Progress     Patient Stated   .  PharmD "I can't afford  my medications" (pt-stated)        CARE PLAN ENTRY (see longtitudinal plan of care for additional care plan information)  Current Barriers:  . Social, financial, and community barriers: o Tired today, seen in office yesterday for COPD exacerbation.  . Polypharmacy; complex patient with multiple comorbidities including COPD (hx tobacco use); CAD, HTN, T2DM o T2DM: last A1c 7.9%; metformin 500 mg BID, Ozempic 0.5 mg weekly- notes that she misunderstood instructions and has been doing 0.5 mg x4 weeks, then 0.25 mg x 4 weeks; Tresiba 30 units daily, Novolog 6 units TID w/ meals, though has been using up to 10 for high carb meals; glipizide 5 mg QAM, 10 mg QPM - APPROVED for Tresiba, Novolog, and Ozempic assistance through 08/07/20 - DENIES any episodes of hypoglycemia  - Reports that she has not been checking glucose readings since her COPD excerbation o COPD: Breztri 2 puffs BID, Proventil HFA PRN - reports that prior to COPD exacerbation, she had not needed any PRN albuterol. Reports she just received 3 inhalers from [employer] assistance  - APPROVED for [place] assistance through 09/07/20 - APPROVED for Proventil HFA through 09/07/20 o HTN: metformin succinate 25 mg daily, lisinopril 10 mg daily just added by Dr. Welton Flakes. ECHO showed LVEF 50-55%; o ASCVD risk reduction: rosuvastatin 40 mg daily, last LDL at goal ~70; ASA 81 mg daily   Pharmacist Clinical Goal(s):  Marland Kitchen Over the next 90 days, patient will work with PharmD and provider towards optimized medication management  Interventions: . Comprehensive medication review performed; medication list updated in electronic medical record . Inter-disciplinary care team collaboration (see longitudinal plan of care) . Reviewed refill procedure for Proventil through [employer] assistance.  . Discussed risks of hypoglycemia w/ glipizide and Novolog. After completion of prednisone for COPD exacerbation, discontinue glipizide. Patient verbalized  understanding . Reviewed that she should continue Ozempic 0.5 mg indefinitely, Ozempic 0.25 mg was just the starter dose. She verbalized understanding.  . Encouraged to re-start checking SMBG TID. Patient verbalized understanding.   Patient Self Care Activities:  . Patient will take medications as prescribed  Please see past updates related to this goal by clicking on the "Past Updates" button in the selected goal          Plan:  - Scheduled f/u call in ~ 3 weeks  [name], PharmD, [place], CPP Clinical Pharmacist [facility] [employer] [phone]

## 2020-04-11 ENCOUNTER — Other Ambulatory Visit: Payer: Self-pay | Admitting: Pharmacy Technician

## 2020-04-11 NOTE — Patient Outreach (Signed)
[facility]) Care Management  04/11/2020  Catherine [name] 26-Nov-1955 [id number]  Care coordination call placed to [employer] in regards to patient's application for Proventil HFA.  Spoke to Alecia Lemming who informed patient was APPROVED 04/02/2020-09/07/2020. He informed the medication was delivered on 04/05/2020 with tracking number [id number].  Will outreach patient to inquire if she has received the medication.  [name] P. Cung Masterson, CPhT [facility] [employer]  [phone]

## 2020-04-11 NOTE — Patient Outreach (Signed)
[facility]) Care Management  04/11/2020  Elizette Shek 1956-07-29 [id number]   ADDENDUM   Successful call placed to patient regarding patient assistance medication delivery of Proventil HFA with [employer], HIPAA identifiers verified.   Patient informs she received the medication. Discussed refill procedure with patient which will involve patient calling the pharmacy and the phone number is listed on the pharmacy label on the inhaler box. Patient verbalized understanding. Confirmed patient had name and number if any issues with obtaining any of the medications she is receiving through the various patient assistance foundations as patient has no other questions or concerns.  Follow up:  Will route note to embedded [facility] RPh [name] for case closure as patient assistance is complete.   Hedy Garro P. Lief Palmatier, CPhT [facility] [employer] [employer]  [phone]

## 2020-04-14 ENCOUNTER — Other Ambulatory Visit: Payer: Self-pay | Admitting: Internal Medicine

## 2020-04-16 DIAGNOSIS — R69 Illness, unspecified: Secondary | ICD-10-CM | POA: Diagnosis not present

## 2020-04-23 ENCOUNTER — Other Ambulatory Visit: Payer: Self-pay | Admitting: Internal Medicine

## 2020-04-24 ENCOUNTER — Ambulatory Visit (INDEPENDENT_AMBULATORY_CARE_PROVIDER_SITE_OTHER): Payer: Medicare HMO | Admitting: Pharmacist

## 2020-04-24 DIAGNOSIS — IMO0002 Reserved for concepts with insufficient information to code with codable children: Secondary | ICD-10-CM

## 2020-04-24 DIAGNOSIS — E1165 Type 2 diabetes mellitus with hyperglycemia: Secondary | ICD-10-CM

## 2020-04-24 DIAGNOSIS — I2510 Atherosclerotic heart disease of native coronary artery without angina pectoris: Secondary | ICD-10-CM | POA: Diagnosis not present

## 2020-04-24 DIAGNOSIS — E118 Type 2 diabetes mellitus with unspecified complications: Secondary | ICD-10-CM | POA: Diagnosis not present

## 2020-04-24 NOTE — Chronic Care Management (AMB) (Signed)
Chronic Care Management   Follow Up Note   04/24/2020 Name: Catherine [name] MRN: [id number] DOB: 02-25-1956  Referred by: Sherlene Shams, MD Reason for referral : Chronic Care Management (Medication Management)   Catherine [name] is a 64 y.o. year old female who is a primary care patient of [name], [name], MD. The CCM team was consulted for assistance with chronic disease management and care coordination needs.    Contacted patient for medication management review.   Review of patient status, including review of consultants reports, relevant laboratory and other test results, and collaboration with appropriate care team members and the patient's provider was performed as part of comprehensive patient evaluation and provision of chronic care management services.    SDOH (Social Determinants of Health) assessments performed: Yes See Care Plan activities for detailed interventions related to SDOH)  SDOH Interventions     Most Recent Value  SDOH Interventions  Financial Strain Interventions Other (Comment)  [medication assistance]       Outpatient Encounter Medications as of 04/24/2020  Medication Sig Note  . albuterol (VENTOLIN HFA) 108 (90 Base) MCG/ACT inhaler USE 2 PUFFS EVERY 6 HOURS AS NEEDED FOR WHEEZING   . aspirin 81 MG EC tablet Take 81 mg by mouth daily.     . Budeson-Glycopyrrol-Formoterol (BREZTRI AEROSPHERE) 160-9-4.8 MCG/ACT AERO Inhale 2 puffs into the lungs in the morning and at bedtime.   . Cholecalciferol (VITAMIN D3) 125 MCG (5000 UT) CAPS Take 1,000 Units by mouth daily.    . insulin aspart (NOVOLOG) 100 UNIT/ML injection Inject 6 Units into the skin 3 (three) times daily before meals.   . insulin degludec (TRESIBA FLEXTOUCH) 100 UNIT/ML FlexTouch Pen Inject 0.3 mLs (30 Units total) into the skin daily. 04/24/2020: Has only been taking 3-4 units, not 30  . ipratropium-albuterol (DUONEB) 0.5-2.5 (3) MG/3ML SOLN USE 1 VIAL VIA NEBULIZER EVERY 6 HOURS AS NEEDED  04/24/2020: 3 daily  . Lactobacillus (PROBIOTIC ACIDOPHILUS PO) Take 1 capsule by mouth daily.   Marland Kitchen lactulose (CHRONULAC) 10 GM/15ML solution 30 ML EVERY 4 HOURS UNTIL CONSTIPATION IS RELIEVED   . lisinopril (ZESTRIL) 10 MG tablet Take 10 mg by mouth daily.   . magnesium oxide (MAG-OX) 400 MG tablet Take 400 mg by mouth every other day.   . Melatonin 5 MG TABS Take 5 mg by mouth at bedtime.   . metFORMIN (GLUCOPHAGE) 500 MG tablet Take 1 tablet (500 mg total) by mouth 2 (two) times daily with a meal.   . metoprolol succinate (TOPROL-XL) 25 MG 24 hr tablet Take 1 tablet (25 mg total) by mouth daily.   Marland Kitchen PARoxetine (PAXIL) 10 MG tablet TAKE 1 TABLET(10 MG) BY MOUTH EVERY MORNING   . Potassium 99 MG TABS Take 99 mg by mouth daily. Reported on 09/20/2015   . rosuvastatin (CRESTOR) 40 MG tablet TAKE 1 TABLET BY MOUTH EVERY DAY   . Semaglutide,0.25 or 0.5MG /DOS, (OZEMPIC, 0.25 OR 0.5 MG/DOSE,) 2 MG/1.5ML SOPN Inject 0.5 mg into the skin once a week.   Marland Kitchen acetaminophen (TYLENOL) 325 MG tablet Take 650 mg by mouth every 6 (six) hours as needed. PRN headache   . ondansetron (ZOFRAN ODT) 4 MG disintegrating tablet Take 1 tablet (4 mg total) by mouth every 8 (eight) hours as needed for nausea or vomiting. (Patient not taking: Reported on 04/24/2020)   . polyethylene glycol (MIRALAX / GLYCOLAX) 17 g packet Take 17 g by mouth 2 (two) times daily.   . promethazine (PHENERGAN) 12.5 MG tablet  TAKE 1 TABLET (12.5 MG TOTAL) BY MOUTH AT BEDTIME AS NEEDED FOR NAUSEA OR VOMITING. (Patient not taking: Reported on 04/24/2020)   . [DISCONTINUED] amoxicillin-clavulanate (AUGMENTIN) 875-125 MG tablet Take 1 tablet by mouth 2 (two) times daily. With food   . [DISCONTINUED] predniSONE (DELTASONE) 20 MG tablet Take 2 tablets (40 mg total) by mouth daily with breakfast.    No facility-administered encounter medications on file as of 04/24/2020.     Objective:   Goals Addressed              This Visit's Progress      Patient Stated   .  PharmD "I can't afford my medications" (pt-stated)        CARE PLAN ENTRY (see longtitudinal plan of care for additional care plan information)  Current Barriers:  . Social, financial, and community barriers: o Reports that she is still hoarse after her last COPD exacerbation o Concerned about sugar readings. Reports they are remaining elevated since last medication changes  . Polypharmacy; complex patient with multiple comorbidities including COPD (hx tobacco use); CAD, HTN, T2DM o T2DM: last A1c 7.9%; metformin 500 mg BID, Ozempic 0.5 mg weekly, Tresiba 30 units daily- upon review today, we realize that she has been injecting 3 units instead of 30, Novolog 6-10 units TID w/ meals, still been taking glipizide 5 mg QAM, 10 mg QPM even though it was discontinued last phone call. - APPROVED for [place], and Ozempic assistance through 08/07/20 - Current glucose readings:  . Fastings: 250-200s . 2 hour after lunch: 170-240 . 2 hour after supper: 200s . Bedtime: 180-200s o COPD: Breztri 2 puffs BID, Proventil HFA PRN, Duonebs ~2-3 times daily - APPROVED for Breztri assistance through 09/07/20 - APPROVED for Proventil HFA through 09/07/20 o HTN: follows w/ Dr. Milta Deiters, metoprolol succinate 25 mg daily, lisinopril 10 mg. ECHO showed LVEF 50-55%; o ASCVD risk reduction: rosuvastatin 40 mg daily, last LDL at goal ~70; ASA 81 mg daily   Pharmacist Clinical Goal(s):  Marland Kitchen Over the next 90 days, patient will work with PharmD and provider towards optimized medication management  Interventions: . Comprehensive medication review performed; medication list updated in electronic medical record . Inter-disciplinary care team collaboration (see longitudinal plan of care) . Reviewed appropriate Tresiba dosing. Patient will start clicking up to 30 units daily instead of 3 units. Continue Novolog 10 units TID with meals, metformin 500 mg BID, Ozempic 0.5 mg daily. Encouraged d/c of  glipizide once she is on appropriate Tresiba dosing.  . Discussed that given frequency of glucose checks (3+ daily) and frequency of injections (3-4 daily), patient should qualify and would benefit from CGM therapy. Will send order to DME supplier today.   Patient Self Care Activities:  . Patient will take medications as prescribed  Please see past updates related to this goal by clicking on the "Past Updates" button in the selected goal          Plan:  - Scheduled f/u call in ~ 3-4 weeks  [name], PharmD, [place], CPP Clinical Pharmacist [facility] [employer] [phone]

## 2020-04-24 NOTE — Patient Instructions (Signed)
Visit Information  Goals Addressed              This Visit's Progress     Patient Stated     PharmD "I can't afford my medications" (pt-stated)        CARE PLAN ENTRY (see longtitudinal plan of care for additional care plan information)  Current Barriers:   Social, financial, and community barriers: o Reports that she is still hoarse after her last COPD exacerbation o Concerned about sugar readings. Reports they are remaining elevated since last medication changes   Polypharmacy; complex patient with multiple comorbidities including COPD (hx tobacco use); CAD, HTN, T2DM o T2DM: last A1c 7.9%; metformin 500 mg BID, Ozempic 0.5 mg weekly, Tresiba 30 units daily- upon review today, we realize that she has been injecting 3 units instead of 30, Novolog 6-10 units TID w/ meals, still been taking glipizide 5 mg QAM, 10 mg QPM even though it was discontinued last phone call. - APPROVED for [place], and Ozempic assistance through 08/07/20 - Current glucose readings:   Fastings: 250-200s  2 hour after lunch: 170-240  2 hour after supper: 200s  Bedtime: 180-200s o COPD: Breztri 2 puffs BID, Proventil HFA PRN, Duonebs ~2-3 times daily - APPROVED for Breztri assistance through 09/07/20 - APPROVED for Proventil HFA through 09/07/20 o HTN: follows w/ Dr. Milta Deiters, metoprolol succinate 25 mg daily, lisinopril 10 mg. ECHO showed LVEF 50-55%; o ASCVD risk reduction: rosuvastatin 40 mg daily, last LDL at goal ~70; ASA 81 mg daily   Pharmacist Clinical Goal(s):   Over the next 90 days, patient will work with PharmD and provider towards optimized medication management  Interventions:  Comprehensive medication review performed; medication list updated in electronic medical record  Inter-disciplinary care team collaboration (see longitudinal plan of care)  Reviewed appropriate Tresiba dosing. Patient will start clicking up to 30 units daily instead of 3 units. Continue Novolog 10  units TID with meals, metformin 500 mg BID, Ozempic 0.5 mg daily. Encouraged d/c of glipizide once she is on appropriate Tresiba dosing.   Discussed that given frequency of glucose checks (3+ daily) and frequency of injections (3-4 daily), patient should qualify and would benefit from CGM therapy. Will send order to DME supplier today.   Patient Self Care Activities:   Patient will take medications as prescribed  Please see past updates related to this goal by clicking on the "Past Updates" button in the selected goal         The patient verbalized understanding of instructions provided today and declined a print copy of patient instruction materials.   Plan:  - Scheduled f/u call in ~ 3-4 weeks  [name], PharmD, [place], CPP Clinical Pharmacist [facility] [employer] [phone]

## 2020-04-25 DIAGNOSIS — J449 Chronic obstructive pulmonary disease, unspecified: Secondary | ICD-10-CM | POA: Diagnosis not present

## 2020-04-25 DIAGNOSIS — G839 Paralytic syndrome, unspecified: Secondary | ICD-10-CM | POA: Diagnosis not present

## 2020-05-02 ENCOUNTER — Encounter: Payer: Self-pay | Admitting: Pharmacist

## 2020-05-02 ENCOUNTER — Ambulatory Visit: Payer: Medicare HMO | Admitting: Pharmacist

## 2020-05-02 DIAGNOSIS — IMO0002 Reserved for concepts with insufficient information to code with codable children: Secondary | ICD-10-CM

## 2020-05-02 NOTE — Chronic Care Management (AMB) (Signed)
Chronic Care Management   Follow Up Note   05/02/2020 Name: Catherine [name] MRN: [id number] DOB: [birth year]  Referred by: Sherlene Shams, MD Reason for referral : Chronic Care Management (Medication Management)   Catherine [name] is a 64 y.o. year old female who is a primary care patient of [name], [name], MD. The CCM team was consulted for assistance with chronic disease management and care coordination needs.    Contacted patient for medication management review.   Review of patient status, including review of consultants reports, relevant laboratory and other test results, and collaboration with appropriate care team members and the patient's provider was performed as part of comprehensive patient evaluation and provision of chronic care management services.    SDOH (Social Determinants of Health) assessments performed: No See Care Plan activities for detailed interventions related to [facility])     Outpatient Encounter Medications as of 05/02/2020  Medication Sig Note  . acetaminophen (TYLENOL) 325 MG tablet Take 650 mg by mouth every 6 (six) hours as needed. PRN headache   . albuterol (VENTOLIN HFA) 108 (90 Base) MCG/ACT inhaler USE 2 PUFFS EVERY 6 HOURS AS NEEDED FOR WHEEZING   . aspirin 81 MG EC tablet Take 81 mg by mouth daily.     . Budeson-Glycopyrrol-Formoterol (BREZTRI AEROSPHERE) 160-9-4.8 MCG/ACT AERO Inhale 2 puffs into the lungs in the morning and at bedtime.   . Cholecalciferol (VITAMIN D3) 125 MCG (5000 UT) CAPS Take 1,000 Units by mouth daily.    . insulin aspart (NOVOLOG) 100 UNIT/ML injection Inject 6 Units into the skin 3 (three) times daily before meals.   . insulin degludec (TRESIBA FLEXTOUCH) 100 UNIT/ML FlexTouch Pen Inject 0.3 mLs (30 Units total) into the skin daily. 04/24/2020: Has only been taking 3-4 units, not 30  . ipratropium-albuterol (DUONEB) 0.5-2.5 (3) MG/3ML SOLN USE 1 VIAL VIA NEBULIZER EVERY 6 HOURS AS NEEDED 04/24/2020: 3 daily  . Lactobacillus  (PROBIOTIC ACIDOPHILUS PO) Take 1 capsule by mouth daily.   Marland Kitchen lactulose (CHRONULAC) 10 GM/15ML solution 30 ML EVERY 4 HOURS UNTIL CONSTIPATION IS RELIEVED   . lisinopril (ZESTRIL) 10 MG tablet Take 10 mg by mouth daily.   . magnesium oxide (MAG-OX) 400 MG tablet Take 400 mg by mouth every other day.   . Melatonin 5 MG TABS Take 5 mg by mouth at bedtime.   . metFORMIN (GLUCOPHAGE) 500 MG tablet Take 1 tablet (500 mg total) by mouth 2 (two) times daily with a meal.   . metoprolol succinate (TOPROL-XL) 25 MG 24 hr tablet Take 1 tablet (25 mg total) by mouth daily.   . ondansetron (ZOFRAN ODT) 4 MG disintegrating tablet Take 1 tablet (4 mg total) by mouth every 8 (eight) hours as needed for nausea or vomiting. (Patient not taking: Reported on 04/24/2020)   . PARoxetine (PAXIL) 10 MG tablet TAKE 1 TABLET(10 MG) BY MOUTH EVERY MORNING   . polyethylene glycol (MIRALAX / GLYCOLAX) 17 g packet Take 17 g by mouth 2 (two) times daily.   . Potassium 99 MG TABS Take 99 mg by mouth daily. Reported on 09/20/2015   . promethazine (PHENERGAN) 12.5 MG tablet TAKE 1 TABLET (12.5 MG TOTAL) BY MOUTH AT BEDTIME AS NEEDED FOR NAUSEA OR VOMITING. (Patient not taking: Reported on 04/24/2020)   . rosuvastatin (CRESTOR) 40 MG tablet TAKE 1 TABLET BY MOUTH EVERY DAY   . Semaglutide,0.25 or 0.5MG /DOS, (OZEMPIC, 0.25 OR 0.5 MG/DOSE,) 2 MG/1.5ML SOPN Inject 0.5 mg into the skin once a week.  No facility-administered encounter medications on file as of 05/02/2020.     Objective:   Goals Addressed              This Visit's Progress     Patient Stated   .  PharmD "I can't afford my medications" (pt-stated)        CARE PLAN ENTRY (see longtitudinal plan of care for additional care plan information)  Current Barriers:  . Social, financial, and community barriers: o Received response from [facility] regarding CGM order. Patient was approved for FreeStyle Libre 2, copay would be $72.14 per 84 day supply. [facility] has been  unable to get in touch with the patient.  . Polypharmacy; complex patient with multiple comorbidities including COPD (hx tobacco use); CAD, HTN, T2DM o T2DM: last A1c 7.9%; metformin 500 mg BID, Ozempic 0.5 mg weekly, Tresiba 30 units daily, Novolog 6-10 units TID w/ meals, still been taking glipizide 5 mg QAM, 10 mg QPM even though it was discontinued last phone call. - APPROVED for [place], and Ozempic assistance through 08/07/20 o COPD: Breztri 2 puffs BID, Proventil HFA PRN, Duonebs ~2-3 times daily - APPROVED for Breztri assistance through 09/07/20 - APPROVED for Proventil HFA through 09/07/20 o HTN: follows w/ Dr. Milta Deiters, metoprolol succinate 25 mg daily, lisinopril 10 mg. ECHO showed LVEF 50-55%; o ASCVD risk reduction: rosuvastatin 40 mg daily, last LDL at goal ~70; ASA 81 mg daily   Pharmacist Clinical Goal(s):  Marland Kitchen Over the next 90 days, patient will work with PharmD and provider towards optimized medication management  Interventions: . Comprehensive medication review performed; medication list updated in electronic medical record . Inter-disciplinary care team collaboration (see longitudinal plan of care) . Contacted patient. She is going to consider the cost, and call [facility] back to either approve or deny the order [phone])  Patient Self Care Activities:  . Patient will take medications as prescribed  Please see past updates related to this goal by clicking on the "Past Updates" button in the selected goal          Plan:  - Will outreach patient as previously scheduled  [name], PharmD, [place], CPP Clinical Pharmacist [facility] [employer] [phone]

## 2020-05-02 NOTE — Patient Instructions (Signed)
Visit Information  Goals Addressed              This Visit's Progress     Patient Stated     PharmD "I can't afford my medications" (pt-stated)        CARE PLAN ENTRY (see longtitudinal plan of care for additional care plan information)  Current Barriers:   Social, financial, and community barriers: o Received response from [facility] regarding CGM order. Patient was approved for FreeStyle Libre 2, copay would be $72.14 per 84 day supply. [facility] has been unable to get in touch with the patient.   Polypharmacy; complex patient with multiple comorbidities including COPD (hx tobacco use); CAD, HTN, T2DM o T2DM: last A1c 7.9%; metformin 500 mg BID, Ozempic 0.5 mg weekly, Tresiba 30 units daily, Novolog 6-10 units TID w/ meals, still been taking glipizide 5 mg QAM, 10 mg QPM even though it was discontinued last phone call. - APPROVED for [place] Fillers, and Ozempic assistance through 08/07/20 o COPD: Breztri 2 puffs BID, Proventil HFA PRN, Duonebs ~2-3 times daily - APPROVED for Breztri assistance through 09/07/20 - APPROVED for Proventil HFA through 09/07/20 o HTN: follows w/ Dr. Milta Deiters, metoprolol succinate 25 mg daily, lisinopril 10 mg. ECHO showed LVEF 50-55%; o ASCVD risk reduction: rosuvastatin 40 mg daily, last LDL at goal ~70; ASA 81 mg daily   Pharmacist Clinical Goal(s):   Over the next 90 days, patient will work with PharmD and provider towards optimized medication management  Interventions:  Comprehensive medication review performed; medication list updated in electronic medical record  Inter-disciplinary care team collaboration (see longitudinal plan of care)  Contacted patient. She is going to consider the cost, and call [facility] back to either approve or deny the order [phone])  Patient Self Care Activities:   Patient will take medications as prescribed  Please see past updates related to this goal by clicking on the "Past Updates" button in the  selected goal         The patient verbalized understanding of instructions provided today and declined a print copy of patient instruction materials.   Plan:  - Will outreach patient as previously scheduled  [name], PharmD, [place], CPP Clinical Pharmacist [facility] [employer] [phone]

## 2020-05-12 DIAGNOSIS — R69 Illness, unspecified: Secondary | ICD-10-CM | POA: Diagnosis not present

## 2020-05-15 ENCOUNTER — Ambulatory Visit: Payer: Medicare HMO | Admitting: Pharmacist

## 2020-05-15 DIAGNOSIS — J432 Centrilobular emphysema: Secondary | ICD-10-CM

## 2020-05-15 DIAGNOSIS — I2510 Atherosclerotic heart disease of native coronary artery without angina pectoris: Secondary | ICD-10-CM

## 2020-05-15 DIAGNOSIS — IMO0002 Reserved for concepts with insufficient information to code with codable children: Secondary | ICD-10-CM

## 2020-05-15 NOTE — Chronic Care Management (AMB) (Signed)
Chronic Care Management   Follow Up Note   05/15/2020 Name: Catherine [name] MRN: [id number] DOB: 1956/08/20  Referred by: Catherine Shams, [name] Reason for referral : Chronic Care Management (Medication Management)   Catherine [name] is a 64 y.o. year old female who is a primary care patient of Catherine [name]. The CCM team was consulted for assistance with chronic disease management and care coordination needs.    Contacted patient for medication management review  Review of patient status, including review of consultants reports, relevant laboratory and other test results, and collaboration with appropriate care team members and the patient's provider was performed as part of comprehensive patient evaluation and provision of chronic care management services.    SDOH (Social Determinants of Health) assessments performed: Yes See Care Plan activities for detailed interventions related to SDOH)  SDOH Interventions     Most Recent Value  SDOH Interventions  Financial Strain Interventions Other (Comment)  [manufacturer assistance]       Outpatient Encounter Medications as of 05/15/2020  Medication Sig Note  . acetaminophen (TYLENOL) 325 MG tablet Take 650 mg by mouth every 6 (six) hours as needed. PRN headache   . aspirin 81 MG EC tablet Take 81 mg by mouth daily.     . Budeson-Glycopyrrol-Formoterol (BREZTRI AEROSPHERE) 160-9-4.8 MCG/ACT AERO Inhale 2 puffs into the lungs in the morning and at bedtime.   . Cholecalciferol (VITAMIN D3) 125 MCG (5000 UT) CAPS Take 1,000 Units by mouth daily.    . insulin aspart (NOVOLOG) 100 UNIT/ML injection Inject 6 Units into the skin 3 (three) times daily before meals.   . insulin degludec (TRESIBA FLEXTOUCH) 100 UNIT/ML FlexTouch Pen Inject 0.3 mLs (30 Units total) into the skin daily.   Catherine [name] (DUONEB) 0.5-2.5 (3) MG/3ML SOLN USE 1 VIAL VIA NEBULIZER EVERY 6 HOURS AS NEEDED 04/24/2020: 3 daily  . Lactobacillus (PROBIOTIC  ACIDOPHILUS PO) Take 1 capsule by mouth daily.   Catherine Kitchen lisinopril (ZESTRIL) 10 MG tablet Take 10 mg by mouth daily.   . magnesium oxide (MAG-OX) 400 MG tablet Take 400 mg by mouth every other day.   . Melatonin 5 MG TABS Take 5 mg by mouth at bedtime.   . metFORMIN (GLUCOPHAGE) 500 MG tablet Take 1 tablet (500 mg total) by mouth 2 (two) times daily with a meal.   . metoprolol succinate (TOPROL-XL) 25 MG 24 hr tablet Take 1 tablet (25 mg total) by mouth daily.   Catherine Kitchen PARoxetine (PAXIL) 10 MG tablet TAKE 1 TABLET(10 MG) BY MOUTH EVERY MORNING   . Potassium 99 MG TABS Take 99 mg by mouth daily. Reported on 09/20/2015   . promethazine (PHENERGAN) 12.5 MG tablet TAKE 1 TABLET (12.5 MG TOTAL) BY MOUTH AT BEDTIME AS NEEDED FOR NAUSEA OR VOMITING.   . rosuvastatin (CRESTOR) 40 MG tablet TAKE 1 TABLET BY MOUTH EVERY DAY   . Semaglutide,0.25 or 0.5MG /DOS, (OZEMPIC, 0.25 OR 0.5 MG/DOSE,) 2 MG/1.5ML SOPN Inject 0.5 mg into the skin once a week.   Catherine Kitchen albuterol (VENTOLIN HFA) 108 (90 Base) MCG/ACT inhaler USE 2 PUFFS EVERY 6 HOURS AS NEEDED FOR WHEEZING (Patient not taking: Reported on 05/15/2020)   . lactulose (CHRONULAC) 10 GM/15ML solution 30 ML EVERY 4 HOURS UNTIL CONSTIPATION IS RELIEVED (Patient not taking: Reported on 05/15/2020)   . ondansetron (ZOFRAN ODT) 4 MG disintegrating tablet Take 1 tablet (4 mg total) by mouth every 8 (eight) hours as needed for nausea or vomiting. (Patient not taking: Reported on 05/15/2020)   .  polyethylene glycol (MIRALAX / GLYCOLAX) 17 g packet Take 17 g by mouth 2 (two) times daily. (Patient not taking: Reported on 05/15/2020)    No facility-administered encounter medications on file as of 05/15/2020.     Objective:   Goals Addressed              This Visit's Progress     Patient Stated   .  PharmD "I can't afford my medications" (pt-stated)        CARE PLAN ENTRY (see longtitudinal plan of care for additional care plan information)  Current Barriers:  . Social, financial,  and community barriers: o Reports that she forgot to call [facility] regarding order for [place] 2. Notes that she often falls asleep and forgets to do finger sticks, so does feel like CGM would be beneficial for her.  . Polypharmacy; complex patient with multiple comorbidities including COPD (hx tobacco use); CAD, HTN, T2DM o T2DM: last A1c 7.9%; metformin 500 mg BID, Ozempic 0.5 mg weekly, Tresiba 30 units daily, Novolog 6-10 units TID w/ meals, also still taking glipizide 5 mg QAM, 10 mg QPM as she would like to complete this supply before stopping.  - Denies episodes of hypoglycemia - Reports fastings ~130-150s, all readings <180 - Does note some nausea after meals. Notes that it is improved by reducing fat intake and reducing portion size. Denies that this symptom coincided with increasing metformin or increasing Ozempic.  - APPROVED for Catherine [name], and Ozempic assistance through 08/07/20 o COPD: Breztri 2 puffs BID, Proventil HFA PRN- has not needed in several weeks, Duonebs ~2-3 times daily - APPROVED for Catherine [name] assistance through 09/07/20 - APPROVED for Proventil HFA through 09/07/20 - Wonders about portable oxygen, would like to discuss this with Catherine [name] o HTN: follows w/ Catherine [name], metoprolol succinate 25 mg daily, lisinopril 10 mg. ECHO showed LVEF 50-55%;  o ASCVD risk reduction: rosuvastatin 40 mg daily, last LDL at goal ~70; ASA 81 mg daily   Pharmacist Clinical Goal(s):  Catherine Kitchen Over the next 90 days, patient will work with PharmD and provider towards optimized medication management  Interventions: . Comprehensive medication review performed; medication list updated in electronic medical record . Inter-disciplinary care team collaboration (see longitudinal plan of care) . Reviewed cost of CGM w/ insurance. Patient notes she has an iphone, so libre 2 sensor is not needed. Patient in agreement that she wants to try CGM. She will call [facility] to approve order. Scheduled for  in-office nurse visit for [facility] teaching.  . Patient overdue for f/u with PCP and labwork. Will collaborate w/ Catherine [name] to determine when she would like patient scheduled for lab work and office visit to address chronic medical conditions, as well as discuss order for portable oxygen . Given patient reports of GI upset, will avoid titrating Ozempic or metformin at this time. Could consider moving forward. Will await patient implementing CGM before adjusting insulin doses. Patient verbalizes understanding. Reviewed that use of glipizide + Novolog increases risk for hypoglycemia. Patient agrees to d/c glipizide if she develops hypoglycemia. Reviewed that if she wishes to complete glipizide supply first, we will likely need to increase Novolog (or titrate metformin/Ozempic) to better target post-prandials w/ glipizide discontinuation. She verbalized understanding.  Patient Self Care Activities:  . Patient will take medications as prescribed  Please see past updates related to this goal by clicking on the "Past Updates" button in the selected goal  Plan:  - Scheduled f/u call in ~ 5-6 weeks  [name], PharmD, [place], CPP Clinical Pharmacist [facility] [employer] [phone]

## 2020-05-15 NOTE — Patient Instructions (Signed)
Visit Information  Goals Addressed              This Visit's Progress     Patient Stated   .  PharmD "I can't afford my medications" (pt-stated)        CARE PLAN ENTRY (see longtitudinal plan of care for additional care plan information)  Current Barriers:  . Social, financial, and community barriers: o Reports that she forgot to call [facility] regarding order for [place] 2. Notes that she often falls asleep and forgets to do finger sticks, so does feel like CGM would be beneficial for her.  . Polypharmacy; complex patient with multiple comorbidities including COPD (hx tobacco use); CAD, HTN, T2DM o T2DM: last A1c 7.9%; metformin 500 mg BID, Ozempic 0.5 mg weekly, Tresiba 30 units daily, Novolog 6-10 units TID w/ meals, also still taking glipizide 5 mg QAM, 10 mg QPM as she would like to complete this supply before stopping.  - Denies episodes of hypoglycemia - Reports fastings ~130-150s, all readings <180 - Does note some nausea after meals. Notes that it is improved by reducing fat intake and reducing portion size. Denies that this symptom coincided with increasing metformin or increasing Ozempic.  - APPROVED for [place] Fillers, and Ozempic assistance through 08/07/20 o COPD: Breztri 2 puffs BID, Proventil HFA PRN- has not needed in several weeks, Duonebs ~2-3 times daily - APPROVED for [place] assistance through 09/07/20 - APPROVED for Proventil HFA through 09/07/20 - Wonders about portable oxygen, would like to discuss this with Dr. Darrick Huntsman o HTN: follows w/ Dr. Milta Deiters, metoprolol succinate 25 mg daily, lisinopril 10 mg. ECHO showed LVEF 50-55%;  o ASCVD risk reduction: rosuvastatin 40 mg daily, last LDL at goal ~70; ASA 81 mg daily   Pharmacist Clinical Goal(s):  [place] Kitchen Over the next 90 days, patient will work with PharmD and provider towards optimized medication management  Interventions: . Comprehensive medication review performed; medication list updated in electronic medical  record . Inter-disciplinary care team collaboration (see longitudinal plan of care) . Reviewed cost of CGM w/ insurance. Patient notes she has an iphone, so libre 2 sensor is not needed. Patient in agreement that she wants to try CGM. She will call [facility] to approve order. Scheduled for in-office nurse visit for [facility] teaching.  . Patient overdue for f/u with PCP and labwork. Will collaborate w/ Dr. Darrick Huntsman to determine when she would like patient scheduled for lab work and office visit to address chronic medical conditions, as well as discuss order for portable oxygen . Given patient reports of GI upset, will avoid titrating Ozempic or metformin at this time. Could consider moving forward. Will await patient implementing CGM before adjusting insulin doses. Patient verbalizes understanding. Reviewed that use of glipizide + Novolog increases risk for hypoglycemia. Patient agrees to d/c glipizide if she develops hypoglycemia. Reviewed that if she wishes to complete glipizide supply first, we will likely need to increase Novolog (or titrate metformin/Ozempic) to better target post-prandials w/ glipizide discontinuation. She verbalized understanding.  Patient Self Care Activities:  . Patient will take medications as prescribed  Please see past updates related to this goal by clicking on the "Past Updates" button in the selected goal         The patient verbalized understanding of instructions provided today and declined a print copy of patient instruction materials.   Plan:  - Scheduled f/u call in ~ 5-6 weeks  [name], PharmD, [place], CPP Clinical Pharmacist [facility] [phone]

## 2020-05-16 DIAGNOSIS — E1165 Type 2 diabetes mellitus with hyperglycemia: Secondary | ICD-10-CM | POA: Diagnosis not present

## 2020-05-26 DIAGNOSIS — J449 Chronic obstructive pulmonary disease, unspecified: Secondary | ICD-10-CM | POA: Diagnosis not present

## 2020-05-26 DIAGNOSIS — G839 Paralytic syndrome, unspecified: Secondary | ICD-10-CM | POA: Diagnosis not present

## 2020-05-30 ENCOUNTER — Ambulatory Visit (INDEPENDENT_AMBULATORY_CARE_PROVIDER_SITE_OTHER): Payer: Medicare HMO

## 2020-05-30 ENCOUNTER — Other Ambulatory Visit: Payer: Self-pay

## 2020-05-30 DIAGNOSIS — E118 Type 2 diabetes mellitus with unspecified complications: Secondary | ICD-10-CM

## 2020-05-30 DIAGNOSIS — E1165 Type 2 diabetes mellitus with hyperglycemia: Secondary | ICD-10-CM | POA: Diagnosis not present

## 2020-05-30 DIAGNOSIS — IMO0002 Reserved for concepts with insufficient information to code with codable children: Secondary | ICD-10-CM

## 2020-05-30 NOTE — Progress Notes (Signed)
Patient presented for Freestyle libre placement and instruction. Patient voiced no concerns nor showed signs of distress during placement. Also instructed patient on how to record and give access to libre view so we can have accurate monitoring system. Instructed patient that if further questions, they can call clinic to be given further direction. 

## 2020-05-31 ENCOUNTER — Telehealth: Payer: Self-pay | Admitting: Internal Medicine

## 2020-05-31 NOTE — Telephone Encounter (Signed)
Patient calling in as her Libre sensor in her arm was not working. States she received an error message and was told to place a new sensor.   Patient states that once she got home yesterday the sensor started to come up a bit. She was able to secure this back in her arm. I informed the Patient that this could be why she was getting the error message.   Informed the Patient to click through the options on her meter that she had replaced her sensor and let it run the initializing process again. This takes 60 minutes. I walked the patient through the steps of applying a new sensor as if this does not work she will need to place a new one.   Patient verbalized understanding and states she will call back in if she continues to have trouble.

## 2020-06-06 ENCOUNTER — Ambulatory Visit: Payer: Medicare HMO | Admitting: Internal Medicine

## 2020-06-10 DIAGNOSIS — R69 Illness, unspecified: Secondary | ICD-10-CM | POA: Diagnosis not present

## 2020-06-11 ENCOUNTER — Other Ambulatory Visit: Payer: Self-pay | Admitting: Internal Medicine

## 2020-06-11 DIAGNOSIS — IMO0002 Reserved for concepts with insufficient information to code with codable children: Secondary | ICD-10-CM

## 2020-06-21 ENCOUNTER — Ambulatory Visit (INDEPENDENT_AMBULATORY_CARE_PROVIDER_SITE_OTHER): Payer: Medicare HMO | Admitting: Pharmacist

## 2020-06-21 DIAGNOSIS — I2510 Atherosclerotic heart disease of native coronary artery without angina pectoris: Secondary | ICD-10-CM

## 2020-06-21 DIAGNOSIS — J432 Centrilobular emphysema: Secondary | ICD-10-CM | POA: Diagnosis not present

## 2020-06-21 DIAGNOSIS — E1165 Type 2 diabetes mellitus with hyperglycemia: Secondary | ICD-10-CM | POA: Diagnosis not present

## 2020-06-21 DIAGNOSIS — I10 Essential (primary) hypertension: Secondary | ICD-10-CM

## 2020-06-21 DIAGNOSIS — E118 Type 2 diabetes mellitus with unspecified complications: Secondary | ICD-10-CM | POA: Diagnosis not present

## 2020-06-21 DIAGNOSIS — IMO0002 Reserved for concepts with insufficient information to code with codable children: Secondary | ICD-10-CM

## 2020-06-21 NOTE — Patient Instructions (Signed)
Catherine [name],   Bring your [place] supplies to the office for your appointment with Dr. Darrick Huntsman. We can help you place a new sensor.   Increase metformin to 1000 mg twice daily. I recommend you increase to 1 tablet after breakfast, 2 tablets after supper for about a week, then increase to 2 tablets after breakfast, 2 tablets after supper. Taking metformin with food on your stomach helps reduce the risk of stomach upset. Let us know if you have increased nausea or diarrhea. We can consider the extended release metformin formulation instead.   As always, call me with any questions or concerns!  Catherine [name], PharmD [phone]  Visit Information  Goals Addressed              This Visit's Progress     Patient Stated     PharmD "I can't afford my medications" (pt-stated)        CARE PLAN ENTRY (see longtitudinal plan of care for additional care plan information)  Current Barriers:   Social, financial, and community barriers: o Reports that [place] sensor fell off and she forgot how to place a new one.  o Reports that she has been needing to take lactulose every 3 days due to constipation o Does indicate more shortness of breath in the past month, requiring more PRN albuterol or Duonebs. Notes this is still less frequent use than prior to starting on Breztri. Denies increased lower extremity edema.  Polypharmacy; complex patient with multiple comorbidities including COPD (hx tobacco use); CAD, HTN, T2DM o T2DM: last A1c 7.9%; metformin 500 mg BID, Ozempic 0.5 mg weekly, Tresiba 30 units daily, Novolog 6-10 units TID w/ meals; does indicate some nausea around meals. Reports she is taking metformin before a meal - Denies episodes of hypoglycemia - Reports fastings ~140s, but post prandials ranging 120-200s - APPROVED for [place] Fillers, and Ozempic assistance through 08/07/20 o COPD: Breztri 2 puffs BID, Proventil HFA PRN, Duonebs ~2-3 times daily - APPROVED for [place] assistance  through 09/07/20 - APPROVED for Proventil HFA through 09/07/20 - Wonders about portable oxygen, would like to discuss this with Dr. Darrick Huntsman o HTN: follows w/ Dr. Milta Deiters, metoprolol succinate 25 mg daily, lisinopril 10 mg. ECHO showed LVEF 50-55%; has not been checking home BP readings lately, but reports last BP reading was ~140/78 o ASCVD risk reduction: rosuvastatin 40 mg daily, last LDL at goal ~70; ASA 81 mg daily   Pharmacist Clinical Goal(s):   Over the next 90 days, patient will work with PharmD and provider towards optimized medication management  Interventions:  Comprehensive medication review performed; medication list updated in electronic medical record  Inter-disciplinary care team collaboration (see longitudinal plan of care)  Discussed taking metformin with food on her stomach to reduce risk of GI upset. Discussed maximizing metformin to improve fasting and post prandial glucose readings. Patient amenable. Increased metformin to 1000 mg BID - counseled to titrate slowly. Discussed that if GI upset worsens we can switch to extended release metformin  Continue Ozempic 0.5 mg weekly, Tresiba 30 units daily, Novolog up to 12 units TID with meals.   Encouraged to bring CGM supplies to upcoming appointment with Dr. Darrick Huntsman for re-education.   Encouraged to discuss increased frequency of SOB w/ PCP. Encourage to discuss her request for portable oxygen with PCP.   Patient Self Care Activities:   Patient will take medications as prescribed  Patient will attend upcoming PCP appt as scheduled  Please see past updates related to this  goal by clicking on the "Past Updates" button in the selected goal         The patient verbalized understanding of instructions provided today and agreed to receive a mailed copy of patient instruction and/or educational materials.  Plan:  - Scheduled f/u call in~ 8 weeks  Catherine [name], PharmD, [place], CPP Clinical Pharmacist [facility] [employer] [phone]

## 2020-06-21 NOTE — Chronic Care Management (AMB) (Signed)
Chronic Care Management   Follow Up Note   06/21/2020 Name: Catherine [name] MRN: [id number] DOB: [birth year]  Referred by: Sherlene Shams, MD Reason for referral : Chronic Care Management (Medication Management)   Catherine [name] is a 64 y.o. year old female who is a primary care patient of [name], [name], MD. The CCM team was consulted for assistance with chronic disease management and care coordination needs.    Contacted patient for medication management review.  Review of patient status, including review of consultants reports, relevant laboratory and other test results, and collaboration with appropriate care team members and the patient's provider was performed as part of comprehensive patient evaluation and provision of chronic care management services.    SDOH (Social Determinants of Health) assessments performed: Yes See Care Plan activities for detailed interventions related to SDOH)  SDOH Interventions     Most Recent Value  SDOH Interventions  Financial Strain Interventions Other (Comment)  [manufacturer assistance]       Outpatient Encounter Medications as of 06/21/2020  Medication Sig Note  . acetaminophen (TYLENOL) 325 MG tablet Take 650 mg by mouth every 6 (six) hours as needed. PRN headache   . albuterol (VENTOLIN HFA) 108 (90 Base) MCG/ACT inhaler USE 2 PUFFS EVERY 6 HOURS AS NEEDED FOR WHEEZING   . aspirin 81 MG EC tablet Take 81 mg by mouth daily.     . Budeson-Glycopyrrol-Formoterol (BREZTRI AEROSPHERE) 160-9-4.8 MCG/ACT AERO Inhale 2 puffs into the lungs in the morning and at bedtime.   . Cholecalciferol (VITAMIN D3) 125 MCG (5000 UT) CAPS Take 1,000 Units by mouth daily.    . insulin aspart (NOVOLOG) 100 UNIT/ML injection Inject 6 Units into the skin 3 (three) times daily before meals.   . insulin degludec (TRESIBA FLEXTOUCH) 100 UNIT/ML FlexTouch Pen Inject 0.3 mLs (30 Units total) into the skin daily.   Marland Kitchen ipratropium-albuterol (DUONEB) 0.5-2.5 (3)  MG/3ML SOLN USE 1 VIAL VIA NEBULIZER EVERY 6 HOURS AS NEEDED 04/24/2020: 3 daily  . Lactobacillus (PROBIOTIC ACIDOPHILUS PO) Take 1 capsule by mouth daily.   Marland Kitchen lactulose (CHRONULAC) 10 GM/15ML solution 30 ML EVERY 4 HOURS UNTIL CONSTIPATION IS RELIEVED   . lisinopril (ZESTRIL) 10 MG tablet Take 10 mg by mouth daily.   . magnesium oxide (MAG-OX) 400 MG tablet Take 400 mg by mouth every other day.   . Melatonin 5 MG TABS Take 5 mg by mouth at bedtime.   . metFORMIN (GLUCOPHAGE) 500 MG tablet TAKE 1 TABLET (500 MG TOTAL) BY MOUTH 2 (TWO) TIMES DAILY WITH A MEAL.   . metoprolol succinate (TOPROL-XL) 25 MG 24 hr tablet Take 1 tablet (25 mg total) by mouth daily.   . ondansetron (ZOFRAN ODT) 4 MG disintegrating tablet Take 1 tablet (4 mg total) by mouth every 8 (eight) hours as needed for nausea or vomiting.   Marland Kitchen PARoxetine (PAXIL) 10 MG tablet TAKE 1 TABLET(10 MG) BY MOUTH EVERY MORNING   . polyethylene glycol (MIRALAX / GLYCOLAX) 17 g packet Take 17 g by mouth 2 (two) times daily.   . Potassium 99 MG TABS Take 99 mg by mouth daily. Reported on 09/20/2015   . promethazine (PHENERGAN) 12.5 MG tablet TAKE 1 TABLET (12.5 MG TOTAL) BY MOUTH AT BEDTIME AS NEEDED FOR NAUSEA OR VOMITING.   . rosuvastatin (CRESTOR) 40 MG tablet TAKE 1 TABLET BY MOUTH EVERY DAY   . Semaglutide,0.25 or 0.5MG /DOS, (OZEMPIC, 0.25 OR 0.5 MG/DOSE,) 2 MG/1.5ML SOPN Inject 0.5 mg into the skin once a  week.    No facility-administered encounter medications on file as of 06/21/2020.     Objective:   Goals Addressed              This Visit's Progress     Patient Stated   .  PharmD "I can't afford my medications" (pt-stated)        CARE PLAN ENTRY (see longtitudinal plan of care for additional care plan information)  Current Barriers:  . Social, financial, and community barriers: o Reports that [place] sensor fell off and she forgot how to place a new one.  o Reports that she has been needing to take lactulose every 3 days  due to constipation o Does indicate more shortness of breath in the past month, requiring more PRN albuterol or Duonebs. Notes this is still less frequent use than prior to starting on Breztri. Denies increased lower extremity edema. . Polypharmacy; complex patient with multiple comorbidities including COPD (hx tobacco use); CAD, HTN, T2DM o T2DM: last A1c 7.9%; metformin 500 mg BID, Ozempic 0.5 mg weekly, Tresiba 30 units daily, Novolog 6-10 units TID w/ meals; does indicate some nausea around meals. Reports she is taking metformin before a meal - Denies episodes of hypoglycemia - Reports fastings ~140s, but post prandials ranging 120-200s - APPROVED for [place] Fillers, and Ozempic assistance through 08/07/20 o COPD: Breztri 2 puffs BID, Proventil HFA PRN, Duonebs ~2-3 times daily - APPROVED for [place] assistance through 09/07/20 - APPROVED for Proventil HFA through 09/07/20 - Wonders about portable oxygen, would like to discuss this with Dr. Darrick Huntsman o HTN: follows w/ Dr. Milta Deiters, metoprolol succinate 25 mg daily, lisinopril 10 mg. ECHO showed LVEF 50-55%; has not been checking home BP readings lately, but reports last BP reading was ~140/78 o ASCVD risk reduction: rosuvastatin 40 mg daily, last LDL at goal ~70; ASA 81 mg daily   Pharmacist Clinical Goal(s):  Marland Kitchen Over the next 90 days, patient will work with PharmD and provider towards optimized medication management  Interventions: . Comprehensive medication review performed; medication list updated in electronic medical record . Inter-disciplinary care team collaboration (see longitudinal plan of care) . Discussed taking metformin with food on her stomach to reduce risk of GI upset. Discussed maximizing metformin to improve fasting and post prandial glucose readings. Patient amenable. Increased metformin to 1000 mg BID - counseled to titrate slowly. Discussed that if GI upset worsens we can switch to extended release metformin . Continue  Ozempic 0.5 mg weekly, Tresiba 30 units daily, Novolog up to 12 units TID with meals.  . Encouraged to bring CGM supplies to upcoming appointment with Dr. Darrick Huntsman for re-education.  . Encouraged to discuss increased frequency of SOB w/ PCP. Encourage to discuss her request for portable oxygen with PCP.   Patient Self Care Activities:  . Patient will take medications as prescribed . Patient will attend upcoming PCP appt as scheduled  Please see past updates related to this goal by clicking on the "Past Updates" button in the selected goal          Plan:  - Scheduled f/u call in~ 8 weeks  [name], PharmD, [place], CPP Clinical Pharmacist [facility] [employer] [phone]

## 2020-06-25 DIAGNOSIS — G839 Paralytic syndrome, unspecified: Secondary | ICD-10-CM | POA: Diagnosis not present

## 2020-06-25 DIAGNOSIS — J449 Chronic obstructive pulmonary disease, unspecified: Secondary | ICD-10-CM | POA: Diagnosis not present

## 2020-06-26 ENCOUNTER — Ambulatory Visit (INDEPENDENT_AMBULATORY_CARE_PROVIDER_SITE_OTHER): Payer: Medicare HMO | Admitting: Internal Medicine

## 2020-06-26 ENCOUNTER — Other Ambulatory Visit: Payer: Self-pay

## 2020-06-26 ENCOUNTER — Encounter: Payer: Self-pay | Admitting: Internal Medicine

## 2020-06-26 VITALS — BP 164/84 | HR 120 | Temp 98.1°F | Resp 17 | Ht 63.0 in | Wt 264.4 lb

## 2020-06-26 DIAGNOSIS — J9611 Chronic respiratory failure with hypoxia: Secondary | ICD-10-CM | POA: Diagnosis not present

## 2020-06-26 DIAGNOSIS — E782 Mixed hyperlipidemia: Secondary | ICD-10-CM

## 2020-06-26 DIAGNOSIS — E118 Type 2 diabetes mellitus with unspecified complications: Secondary | ICD-10-CM

## 2020-06-26 DIAGNOSIS — Z23 Encounter for immunization: Secondary | ICD-10-CM

## 2020-06-26 DIAGNOSIS — E538 Deficiency of other specified B group vitamins: Secondary | ICD-10-CM | POA: Diagnosis not present

## 2020-06-26 DIAGNOSIS — R809 Proteinuria, unspecified: Secondary | ICD-10-CM

## 2020-06-26 DIAGNOSIS — IMO0002 Reserved for concepts with insufficient information to code with codable children: Secondary | ICD-10-CM

## 2020-06-26 DIAGNOSIS — G629 Polyneuropathy, unspecified: Secondary | ICD-10-CM

## 2020-06-26 DIAGNOSIS — E1165 Type 2 diabetes mellitus with hyperglycemia: Secondary | ICD-10-CM | POA: Diagnosis not present

## 2020-06-26 DIAGNOSIS — Z1159 Encounter for screening for other viral diseases: Secondary | ICD-10-CM | POA: Diagnosis not present

## 2020-06-26 DIAGNOSIS — E1129 Type 2 diabetes mellitus with other diabetic kidney complication: Secondary | ICD-10-CM

## 2020-06-26 LAB — POCT GLYCOSYLATED HEMOGLOBIN (HGB A1C): Hemoglobin A1C: 7.2 % — AB (ref 4.0–5.6)

## 2020-06-26 MED ORDER — LISINOPRIL 20 MG PO TABS
20.0000 mg | ORAL_TABLET | Freq: Every day | ORAL | 1 refills | Status: DC
Start: 2020-06-26 — End: 2020-11-29

## 2020-06-26 NOTE — Assessment & Plan Note (Signed)
LDL and triglycerides are at goal on current medications. She has no side effects and liver enzymes are normal. No changes today  Lab Results  Component Value Date   CHOL 139 12/10/2018   HDL 41 (L) 12/10/2018   LDLCALC 73 12/10/2018   LDLDIRECT 70 12/10/2018   TRIG 177 (H) 12/10/2018   CHOLHDL 3.4 12/10/2018

## 2020-06-26 NOTE — Assessment & Plan Note (Addendum)
ARB recommended in the past but deferred secondary to normal blood pressure.  She is now taking an ACE Inhibitor . Given her elevated readings will increase  Dose

## 2020-06-26 NOTE — Assessment & Plan Note (Signed)
She requires 24 hour use of oxygen at4L.  She is requesting a lighter weight tank to improve her mobility

## 2020-06-26 NOTE — Progress Notes (Addendum)
Subjective:  Patient ID: Catherine [name], female    DOB: [birth year]  Age: 64 y.o. MRN: [id number]  CC: The primary encounter diagnosis was Need for immunization against influenza. Diagnoses of Diabetes mellitus type 2, uncontrolled, with complications (HCC), Neuropathy, Chronic hypoxemic respiratory failure (HCC), Mixed hyperlipidemia, Microalbuminuria due to type 2 diabetes mellitus (HCC), and B12 deficiency were also pertinent to this visit.  HPI Catherine [name] presents for  Follow up on type 2 DM, insulin dependent  This visit occurred during the SARS-CoV-2 public health emergency.  Safety protocols were in place, including screening questions prior to the visit, additional usage of staff PPE, and extensive cleaning of exam room while observing appropriate contact time as indicated for disinfecting solutions.    T2DM:  She  feels generally unwell due to chronic dyspnea and pain.  She is not  exercising regularly or trying to lose weight. Checking  blood sugars 1-2 times daily at variable times, usually only if she feels she may be having a hypoglycemic event. .  BS have been under 130 fasting and < 150 post prandially.  Denies any recent hypoglyemic events.  Taking   medications as directed. Following a carbohydrate modified diet 6 days per week. Denies numbness, burning and tingling of extremities. Appetite is good.   Some itching to toes  (neuropathy) . Using 10 to 14 units of Lispro with meals.   Last eye exam  Overdue  Last one 2017 .. 9 lb wt loss   Appetite smaller and bowels less constipated Due to increased metformin dose   Sleeping a lot.  Bored, due to inactivity.  Not walking due to shortness of breath and having to carry the heavy portable oxygen. Crochets several hours daily , stopped due to left wrist hurting   COPD wants to get INogen portable at 4 L /min  Current portable tank is too heavy.      Outpatient Medications Prior to Visit  Medication Sig Dispense Refill  .  acetaminophen (TYLENOL) 325 MG tablet Take 650 mg by mouth every 6 (six) hours as needed. PRN headache    . albuterol (VENTOLIN HFA) 108 (90 Base) MCG/ACT inhaler USE 2 PUFFS EVERY 6 HOURS AS NEEDED FOR WHEEZING 8 g 3  . aspirin 81 MG EC tablet Take 81 mg by mouth daily.      . Budeson-Glycopyrrol-Formoterol (BREZTRI AEROSPHERE) 160-9-4.8 MCG/ACT AERO Inhale 2 puffs into the lungs in the morning and at bedtime.    . Cholecalciferol (VITAMIN D3) 125 MCG (5000 UT) CAPS Take 1,000 Units by mouth daily.     . insulin aspart (NOVOLOG) 100 UNIT/ML injection Inject 6 Units into the skin 3 (three) times daily before meals. 22 mL 1  . insulin degludec (TRESIBA FLEXTOUCH) 100 UNIT/ML FlexTouch Pen Inject 0.3 mLs (30 Units total) into the skin daily. 36 mL 2  . ipratropium-albuterol (DUONEB) 0.5-2.5 (3) MG/3ML SOLN USE 1 VIAL VIA NEBULIZER EVERY 6 HOURS AS NEEDED 270 mL 2  . Lactobacillus (PROBIOTIC ACIDOPHILUS PO) Take 1 capsule by mouth daily.    Marland Kitchen lactulose (CHRONULAC) 10 GM/15ML solution 30 ML EVERY 4 HOURS UNTIL CONSTIPATION IS RELIEVED 473 mL 1  . magnesium oxide (MAG-OX) 400 MG tablet Take 400 mg by mouth every other day.    . Melatonin 5 MG TABS Take 5 mg by mouth at bedtime.    . metFORMIN (GLUCOPHAGE) 500 MG tablet TAKE 1 TABLET (500 MG TOTAL) BY MOUTH 2 (TWO) TIMES DAILY WITH A MEAL. (Patient taking differently:  Take 1,000 mg by mouth 2 (two) times daily with a meal. ) 180 tablet 1  . metoprolol succinate (TOPROL-XL) 25 MG 24 hr tablet Take 1 tablet (25 mg total) by mouth daily. 90 tablet 3  . ondansetron (ZOFRAN ODT) 4 MG disintegrating tablet Take 1 tablet (4 mg total) by mouth every 8 (eight) hours as needed for nausea or vomiting. 20 tablet 0  . PARoxetine (PAXIL) 10 MG tablet TAKE 1 TABLET(10 MG) BY MOUTH EVERY MORNING 90 tablet 1  . polyethylene glycol (MIRALAX / GLYCOLAX) 17 g packet Take 17 g by mouth 2 (two) times daily.    . Potassium 99 MG TABS Take 99 mg by mouth daily. Reported on  09/20/2015    . promethazine (PHENERGAN) 12.5 MG tablet TAKE 1 TABLET (12.5 MG TOTAL) BY MOUTH AT BEDTIME AS NEEDED FOR NAUSEA OR VOMITING. 30 tablet 0  . rosuvastatin (CRESTOR) 40 MG tablet TAKE 1 TABLET BY MOUTH EVERY DAY 90 tablet 3  . Semaglutide,0.25 or 0.5MG /DOS, (OZEMPIC, 0.25 OR 0.5 MG/DOSE,) 2 MG/1.5ML SOPN Inject 0.5 mg into the skin once a week. 1 pen 1  . lisinopril (ZESTRIL) 10 MG tablet Take 10 mg by mouth daily.     No facility-administered medications prior to visit.    Review of Systems;  Patient denies headache, fevers, malaise, unintentional weight loss, skin rash, eye pain, sinus congestion and sinus pain, sore throat, dysphagia,  hemoptysis , cough, dyspnea, wheezing, chest pain, palpitations, orthopnea, edema, abdominal pain, nausea, melena, diarrhea, constipation, flank pain, dysuria, hematuria, urinary  Frequency, nocturia, numbness, tingling, seizures,  Focal weakness, Loss of consciousness,  Tremor, insomnia, depression, anxiety, and suicidal ideation.      Objective:  BP (!) 164/84 (BP Location: Left Arm, Patient Position: Sitting, Cuff Size: Large)   Pulse (!) 120   Temp 98.1 F (36.7 C) (Oral)   Resp 17   Ht 5\' 3"  (1.6 m)   Wt 264 lb 6.4 oz (119.9 kg)   SpO2 96%   BMI 46.84 kg/m   BP Readings from Last 3 Encounters:  06/26/20 (!) 164/84  01/19/20 (!) 142/95  01/16/20 (!) 167/79    Wt Readings from Last 3 Encounters:  06/26/20 264 lb 6.4 oz (119.9 kg)  04/05/20 (!) 268 lb (121.6 kg)  02/21/20 275 lb (124.7 kg)    General appearance: alert, cooperative and appears stated age Ears: normal TM's and external ear canals both ears Throat: lips, mucosa, and tongue normal; teeth and gums normal Neck: no adenopathy, no carotid bruit, supple, symmetrical, trachea midline and thyroid not enlarged, symmetric, no tenderness/mass/nodules Back: symmetric, no curvature. ROM normal. No CVA tenderness. Lungs: clear to auscultation bilaterally Heart: regular rate  and rhythm, S1, S2 normal, no murmur, click, rub or gallop Abdomen: soft, non-tender; bowel sounds normal; no masses,  no organomegaly Pulses: 2+ and symmetric Skin: Skin color, texture, turgor normal. No rashes or lesions Lymph nodes: Cervical, supraclavicular, and axillary nodes normal.  Lab Results  Component Value Date   HGBA1C 7.2 (A) 06/26/2020   HGBA1C 7.9 (H) 01/19/2020   HGBA1C 8.2 (H) 01/08/2020    Lab Results  Component Value Date   CREATININE 0.78 06/26/2020   CREATININE 0.72 01/19/2020   CREATININE 0.63 01/16/2020    Lab Results  Component Value Date   WBC 10.3 01/19/2020   HGB 13.6 01/19/2020   HCT 41.8 01/19/2020   PLT 293 01/19/2020   GLUCOSE 140 (H) 06/26/2020   CHOL 141 06/26/2020   TRIG 211.0 (H)  06/26/2020   HDL 39.10 06/26/2020   LDLDIRECT 74.0 06/26/2020   LDLCALC 73 12/10/2018   ALT 19 06/26/2020   AST 35 06/26/2020   NA 140 06/26/2020   K 4.5 06/26/2020   CL 98 06/26/2020   CREATININE 0.78 06/26/2020   BUN 11 06/26/2020   CO2 33 (H) 06/26/2020   TSH 1.10 01/12/2020   INR 0.9 07/22/2012   HGBA1C 7.2 (A) 06/26/2020   MICROALBUR 4.5 (H) 03/23/2018       Assessment & Plan:   Problem List Items Addressed This Visit      Unprioritized   B12 deficiency    b12 deficiency diagnosed. patient asked to return for B12 injections and additional labs       Relevant Orders   Folate RBC   Intrinsic Factor Antibodies   Methylmalonic acid, serum   Chronic hypoxemic respiratory failure (HCC)    She requires 24 hour use of oxygen at4L.  She is requesting a lighter weight tank to improve her mobility      Diabetes mellitus type 2, uncontrolled, with complications (HCC)    Improved with higher metformin dose, Ozempic,  Tresiba and  mealtime insulin. She has lost weight as well.  Lab Results  Component Value Date   HGBA1C 7.2 (A) 06/26/2020   Lab Results  Component Value Date   MICROALBUR 4.5 (H) 03/23/2018   MICROALBUR <0.2 08/08/2016             Relevant Medications   lisinopril (ZESTRIL) 20 MG tablet   Other Relevant Orders   POCT HgB A1C (Completed)   Lipid panel (Completed)   Comprehensive metabolic panel (Completed)   Hyperlipidemia    LDL and triglycerides are at goal on current medications. She has no side effects and liver enzymes are normal. No changes today  Lab Results  Component Value Date   CHOL 139 12/10/2018   HDL 41 (L) 12/10/2018   LDLCALC 73 12/10/2018   LDLDIRECT 70 12/10/2018   TRIG 177 (H) 12/10/2018   CHOLHDL 3.4 12/10/2018         Relevant Medications   lisinopril (ZESTRIL) 20 MG tablet   Microalbuminuria due to type 2 diabetes mellitus (HCC)    ARB recommended in the past but deferred secondary to normal blood pressure.  She is now taking an ACE Inhibitor . Given her elevated readings will increase  Dose       Relevant Medications   lisinopril (ZESTRIL) 20 MG tablet    Other Visit Diagnoses    Need for immunization against influenza    -  Primary   Relevant Orders   Flu Vaccine QUAD High Dose(Fluad) (Completed)   Neuropathy       Relevant Orders   Vitamin B12 (Completed)     I provided  30 minutes of  face-to-face time during this encounter reviewing patient's current problems and past surgeries, labs and imaging studies, providing counseling on the above mentioned problems , and coordination  of care .  I have changed Catherine [name] "Debbie"'s lisinopril. I am also having her maintain her aspirin, Potassium, Vitamin D3, melatonin, Lactobacillus (PROBIOTIC ACIDOPHILUS PO), magnesium oxide, promethazine, rosuvastatin, acetaminophen, polyethylene glycol, ondansetron, metoprolol succinate, albuterol, Ozempic (0.25 or 0.5 MG/DOSE), Tresiba FlexTouch, insulin aspart, Breztri Aerosphere, PARoxetine, lactulose, ipratropium-albuterol, and metFORMIN.  Meds ordered this encounter  Medications  . lisinopril (ZESTRIL) 20 MG tablet    Sig: Take 1 tablet (20 mg total) by mouth  daily.    Dispense:  90 tablet  Refill:  1    NOTE DOSE INCREASE    Medications Discontinued During This Encounter  Medication Reason  . lisinopril (ZESTRIL) 10 MG tablet     Follow-up: Return in about 3 months (around 09/26/2020) for follow up diabetes.   Sherlene Shamseresa L Hyrum Shaneyfelt, MD

## 2020-06-26 NOTE — Patient Instructions (Addendum)
Your DEXA scan and your annual  mammogram has been ordered.  You are encouraged (required) to call [facility] to schedule your appointments;  [phone]

## 2020-06-26 NOTE — Assessment & Plan Note (Addendum)
Improved with higher metformin dose, Ozempic,  Tresiba and  mealtime insulin. She has lost weight as well.  Lab Results  Component Value Date   HGBA1C 7.2 (A) 06/26/2020   Lab Results  Component Value Date   MICROALBUR 4.5 (H) 03/23/2018   MICROALBUR <0.2 08/08/2016

## 2020-06-27 LAB — COMPREHENSIVE METABOLIC PANEL
ALT: 19 U/L (ref 0–35)
AST: 35 U/L (ref 0–37)
Albumin: 4 g/dL (ref 3.5–5.2)
Alkaline Phosphatase: 95 U/L (ref 39–117)
BUN: 11 mg/dL (ref 6–23)
CO2: 33 mEq/L — ABNORMAL HIGH (ref 19–32)
Calcium: 9.4 mg/dL (ref 8.4–10.5)
Chloride: 98 mEq/L (ref 96–112)
Creatinine, Ser: 0.78 mg/dL (ref 0.40–1.20)
GFR: 79.26 mL/min (ref >60.00)
Glucose, Bld: 140 mg/dL — ABNORMAL HIGH (ref 70–99)
Potassium: 4.5 mEq/L (ref 3.5–5.1)
Sodium: 140 mEq/L (ref 135–145)
Total Bilirubin: 0.3 mg/dL (ref 0.2–1.2)
Total Protein: 6.9 g/dL (ref 6.0–8.3)

## 2020-06-27 LAB — LIPID PANEL
Cholesterol: 141 mg/dL (ref 0–200)
HDL: 39.1 mg/dL (ref >39.00)
NonHDL: 102.23
Total CHOL/HDL Ratio: 4
Triglycerides: 211 mg/dL — ABNORMAL HIGH (ref 0.0–149.0)
VLDL: 42.2 mg/dL — ABNORMAL HIGH (ref 0.0–40.0)

## 2020-06-27 LAB — VITAMIN B12: Vitamin B-12: 101 pg/mL — ABNORMAL LOW (ref 211–911)

## 2020-06-27 LAB — LDL CHOLESTEROL, DIRECT: Direct LDL: 74 mg/dL

## 2020-06-30 DIAGNOSIS — E538 Deficiency of other specified B group vitamins: Secondary | ICD-10-CM | POA: Insufficient documentation

## 2020-06-30 NOTE — Progress Notes (Signed)
Labs are all fine,  except your Your B12 is low.   Untreated B12 deficiency can lead to irreversible dementia and neuropathy so they are  very important to treat   Please make an RN appt for a  b12 injection to be done weekly for a total of 4 weeks, you will need additional labs during one of your nurse visits

## 2020-06-30 NOTE — Assessment & Plan Note (Signed)
b12 deficiency diagnosed. patient asked to return for B12 injections and additional labs

## 2020-06-30 NOTE — Addendum Note (Signed)
Addended by: Sherlene Shams on: 06/30/2020 04:55 PM   Modules accepted: Orders

## 2020-07-05 ENCOUNTER — Other Ambulatory Visit: Payer: Self-pay

## 2020-07-05 ENCOUNTER — Other Ambulatory Visit: Payer: Medicare HMO

## 2020-07-05 ENCOUNTER — Ambulatory Visit (INDEPENDENT_AMBULATORY_CARE_PROVIDER_SITE_OTHER): Payer: Medicare HMO

## 2020-07-05 DIAGNOSIS — E538 Deficiency of other specified B group vitamins: Secondary | ICD-10-CM

## 2020-07-05 MED ORDER — CYANOCOBALAMIN 1000 MCG/ML IJ SOLN
1000.0000 ug | Freq: Once | INTRAMUSCULAR | Status: AC
  Administered 2020-07-05: 1000 ug via INTRAMUSCULAR

## 2020-07-05 NOTE — Progress Notes (Signed)
Patient presented for B 12 injection to left deltoid, patient voiced no concerns nor showed any signs of distress during injection. 

## 2020-07-08 ENCOUNTER — Other Ambulatory Visit: Payer: Self-pay | Admitting: Internal Medicine

## 2020-07-10 DIAGNOSIS — R69 Illness, unspecified: Secondary | ICD-10-CM | POA: Diagnosis not present

## 2020-07-11 ENCOUNTER — Other Ambulatory Visit: Payer: Self-pay

## 2020-07-11 ENCOUNTER — Ambulatory Visit (INDEPENDENT_AMBULATORY_CARE_PROVIDER_SITE_OTHER): Payer: Medicare HMO

## 2020-07-11 ENCOUNTER — Other Ambulatory Visit (INDEPENDENT_AMBULATORY_CARE_PROVIDER_SITE_OTHER): Payer: Medicare HMO

## 2020-07-11 DIAGNOSIS — R5383 Other fatigue: Secondary | ICD-10-CM

## 2020-07-11 DIAGNOSIS — Z1159 Encounter for screening for other viral diseases: Secondary | ICD-10-CM

## 2020-07-11 DIAGNOSIS — IMO0002 Reserved for concepts with insufficient information to code with codable children: Secondary | ICD-10-CM

## 2020-07-11 DIAGNOSIS — E538 Deficiency of other specified B group vitamins: Secondary | ICD-10-CM | POA: Diagnosis not present

## 2020-07-11 DIAGNOSIS — E1165 Type 2 diabetes mellitus with hyperglycemia: Secondary | ICD-10-CM

## 2020-07-11 MED ORDER — CYANOCOBALAMIN 1000 MCG/ML IJ SOLN
1000.0000 ug | Freq: Once | INTRAMUSCULAR | Status: AC
  Administered 2020-07-11: 1000 ug via INTRAMUSCULAR

## 2020-07-11 NOTE — Progress Notes (Signed)
Patient presented for B 12 injection to left deltoid, patient voiced no concerns nor showed any signs of distress during injection. 

## 2020-07-15 LAB — INTRINSIC FACTOR ANTIBODIES: Intrinsic Factor: NEGATIVE

## 2020-07-15 LAB — FOLATE RBC: RBC Folate: 532 ng/mL RBC (ref >280)

## 2020-07-15 LAB — HEPATITIS C ANTIBODY
Hepatitis C Ab: NONREACTIVE
SIGNAL TO CUT-OFF: 0.15 (ref <1.00)

## 2020-07-15 LAB — METHYLMALONIC ACID, SERUM: Methylmalonic Acid, Quant: 236 nmol/L (ref 87–318)

## 2020-07-15 NOTE — Progress Notes (Signed)
Your intrinisic factor antibody was negative,  So that means you will be able to continue treating your B12 deficiency  with oral tablets if you prefer to switch from monthly injections  (after you have had 4 weekly injections)  The usual dose is 1000 mcg daily

## 2020-07-16 NOTE — Addendum Note (Signed)
Addended by: W[place] Fillers on: 07/16/2020 08:08 AM   Modules accepted: Orders

## 2020-07-18 ENCOUNTER — Other Ambulatory Visit: Payer: Self-pay

## 2020-07-18 ENCOUNTER — Ambulatory Visit (INDEPENDENT_AMBULATORY_CARE_PROVIDER_SITE_OTHER): Payer: Medicare HMO

## 2020-07-18 DIAGNOSIS — E538 Deficiency of other specified B group vitamins: Secondary | ICD-10-CM

## 2020-07-18 MED ORDER — CYANOCOBALAMIN 1000 MCG/ML IJ SOLN
1000.0000 ug | Freq: Once | INTRAMUSCULAR | Status: AC
  Administered 2020-07-18: 1000 ug via INTRAMUSCULAR

## 2020-07-18 NOTE — Progress Notes (Signed)
Patient presented for B 12 injection to right deltoid, patient voiced no concerns nor showed any signs of distress during injection. 

## 2020-07-26 ENCOUNTER — Ambulatory Visit (INDEPENDENT_AMBULATORY_CARE_PROVIDER_SITE_OTHER): Payer: Medicare HMO

## 2020-07-26 ENCOUNTER — Other Ambulatory Visit: Payer: Self-pay

## 2020-07-26 DIAGNOSIS — E538 Deficiency of other specified B group vitamins: Secondary | ICD-10-CM | POA: Diagnosis not present

## 2020-07-26 DIAGNOSIS — J449 Chronic obstructive pulmonary disease, unspecified: Secondary | ICD-10-CM | POA: Diagnosis not present

## 2020-07-26 DIAGNOSIS — G839 Paralytic syndrome, unspecified: Secondary | ICD-10-CM | POA: Diagnosis not present

## 2020-07-26 MED ORDER — CYANOCOBALAMIN 1000 MCG/ML IJ SOLN
1000.0000 ug | Freq: Once | INTRAMUSCULAR | Status: AC
  Administered 2020-07-26: 1000 ug via INTRAMUSCULAR

## 2020-07-26 NOTE — Progress Notes (Addendum)
Patient presented for B 12 injection to left deltoid, patient voiced no concerns nor showed any signs of distress during injection.  Reviewed.  Dr Scott 

## 2020-07-27 ENCOUNTER — Other Ambulatory Visit: Payer: Self-pay | Admitting: Internal Medicine

## 2020-08-01 ENCOUNTER — Ambulatory Visit: Payer: Medicare HMO | Admitting: Pharmacist

## 2020-08-01 DIAGNOSIS — J9611 Chronic respiratory failure with hypoxia: Secondary | ICD-10-CM

## 2020-08-01 DIAGNOSIS — I10 Essential (primary) hypertension: Secondary | ICD-10-CM

## 2020-08-01 DIAGNOSIS — E1165 Type 2 diabetes mellitus with hyperglycemia: Secondary | ICD-10-CM

## 2020-08-01 DIAGNOSIS — I2510 Atherosclerotic heart disease of native coronary artery without angina pectoris: Secondary | ICD-10-CM

## 2020-08-01 DIAGNOSIS — IMO0002 Reserved for concepts with insufficient information to code with codable children: Secondary | ICD-10-CM

## 2020-08-01 DIAGNOSIS — J432 Centrilobular emphysema: Secondary | ICD-10-CM

## 2020-08-01 MED ORDER — ALBUTEROL SULFATE HFA 108 (90 BASE) MCG/ACT IN AERS: 1.0000 | INHALATION_SPRAY | Freq: Four times a day (QID) | RESPIRATORY_TRACT | 3 refills | Status: DC | PRN

## 2020-08-01 MED ORDER — OZEMPIC (1 MG/DOSE) 4 MG/3ML ~~LOC~~ SOPN: 1.0000 mg | PEN_INJECTOR | SUBCUTANEOUS | 3 refills | Status: DC

## 2020-08-01 NOTE — Chronic Care Management (AMB) (Signed)
Chronic Care Management   Pharmacy Note  08/01/2020 Name: Catherine [name] MRN: [id number] DOB: 03-19-1956   Subjective:  Catherine [name] is a 64 y.o. year old female who is a primary care patient of [name], [name], MD. The CCM team was consulted for assistance with chronic disease management and care coordination needs.    Engaged with patient by telephone for follow up visit in response to provider referral for pharmacy case management and/or care coordination services.   Consent to Services:  @M @ @LNAME @ was given information about Chronic Care Management services, agreed to services, and gave verbal consent prior to initiation of services on 09/15/2019. Please see initial visit note for detailed documentation.   SDOH (Social Determinants of Health) assessments and interventions performed:  SDOH Interventions     Most Recent Value  SDOH Interventions  Financial Strain Interventions Other (Comment)  [manufacturer assistance]       Objective:  Lab Results  Component Value Date   CREATININE 0.78 06/26/2020   CREATININE 0.72 01/19/2020   CREATININE 0.63 01/16/2020    Lab Results  Component Value Date   HGBA1C 7.2 (A) 06/26/2020       Component Value Date/Time   CHOL 141 06/26/2020 1441   CHOL 185 11/11/2011 0914   TRIG 211.0 (H) 06/26/2020 1441   TRIG 166 11/11/2011 0914   HDL 39.10 06/26/2020 1441   HDL 47 11/11/2011 0914   CHOLHDL 4 06/26/2020 1441   VLDL 42.2 (H) 06/26/2020 1441   VLDL 33 11/11/2011 0914   LDLCALC 73 12/10/2018 1447   LDLCALC 105 (H) 11/11/2011 0914   LDLDIRECT 74.0 06/26/2020 1441    BP Readings from Last 3 Encounters:  06/26/20 (!) 164/84  01/19/20 (!) 142/95  01/16/20 (!) 167/79    Assessment/Interventions: Review of patient past medical history, allergies, medications, health status, including review of consultants reports, laboratory and other test data, was performed as part of comprehensive evaluation and provision of chronic  care management services.   Allergies  Allergen Reactions  . Sulfa Antibiotics Swelling    Patient reports caused cellulitis Other reaction(s): Other (See Comments) cellulitis  . Hydroxychloroquine     Other reaction(s): Other (See Comments) Gi upset  . Oxycodone-Acetaminophen   . Percocet [Oxycodone-Acetaminophen]   . Vicodin [Hydrocodone-Acetaminophen]   . Aspirin Other (See Comments)    Other reaction(s): Blood Disorder  . Ibuprofen     Other reaction(s): Blood Disorder  . Omnipaque [Iohexol] Itching    Patient states she has had IV contrast multiple times without any reaction. But she said on Jan 07, 2020 after injection she had terrible itching.  They gave her Benadryl which stopped it immediately.  Told Dr. 03/17/20 this and she elected to give 50 mg IV Benadryl before scan today 01-19-2020.  She had no reaction after injection today. It wasn't documented on 01-07-2020.    Medications Reviewed Today    Reviewed by 01-21-2020, RPH-CPP (Pharmacist) on 08/01/20 at 1424  Med List Status: <None>  Medication Order Taking? Sig Documenting Provider Last Dose Status Informant  acetaminophen (TYLENOL) 325 MG tablet Lourena Simmonds Yes Take 650 mg by mouth every 6 (six) hours as needed. PRN headache [provider] Taking Active Self  albuterol (VENTOLIN HFA) 108 (90 Base) MCG/ACT inhaler 08/03/20 Yes USE 2 PUFFS EVERY 6 HOURS AS NEEDED FOR WHEEZING [id number], NP Taking Active   aspirin 81 MG EC tablet [id number] Yes Take 81 mg by mouth daily.   [provider] Taking Active  Self  Budeson-Glycopyrrol-Formoterol (BREZTRI AEROSPHERE) 160-9-4.8 MCG/ACT AERO [id number] Yes Inhale 2 puffs into the lungs in the morning and at bedtime. [provider] Taking Active   Cholecalciferol (VITAMIN D3) 125 MCG (5000 UT) CAPS [id number] Yes Take 1,000 Units by mouth daily.  [provider] Taking Active Self  insulin aspart (NOVOLOG) 100 UNIT/ML injection [id number]  Yes Inject 6 Units into the skin 3 (three) times daily before meals. Sherlene Shams, MD Taking Active            Med Note [name], [name]   Wed Aug 01, 2020  2:17 PM) 10-14 units before meals  insulin degludec (TRESIBA FLEXTOUCH) 100 UNIT/ML FlexTouch Pen [id number] Yes Inject 0.3 mLs (30 Units total) into the skin daily. Sherlene Shams, MD Taking Active            Med Note [name] May 15, 2020  3:47 PM)    ipratropium-albuterol (DUONEB) 0.5-2.5 (3) MG/3ML SOLN [id number] Yes USE 1 VIAL VIA NEBULIZER EVERY 6 HOURS AS NEEDED Sherlene Shams, MD Taking Active   Lactobacillus (PROBIOTIC ACIDOPHILUS PO) [id number] Yes Take 1 capsule by mouth daily. [provider] Taking Active Self  lactulose (CHRONULAC) 10 GM/15ML solution [id number] Yes 30 ML EVERY 4 HOURS UNTIL CONSTIPATION IS RELIEVED Sherlene Shams, MD Taking Active   lisinopril (ZESTRIL) 20 MG tablet [id number] Yes Take 1 tablet (20 mg total) by mouth daily. Sherlene Shams, MD Taking Active   magnesium oxide (MAG-OX) 400 MG tablet [id number] Yes Take 400 mg by mouth every other day. [provider] Taking Active Self  Melatonin 5 MG TABS [id number] Yes Take 5 mg by mouth at bedtime. [provider] Taking Active Self  metFORMIN (GLUCOPHAGE) 500 MG tablet [id number] Yes TAKE 1 TABLET (500 MG TOTAL) BY MOUTH 2 (TWO) TIMES DAILY WITH A MEAL.  Patient taking differently: Take 1,000 mg by mouth 2 (two) times daily with a meal.    Sherlene Shams, MD Taking Active   metoprolol succinate (TOPROL-XL) 25 MG 24 hr tablet [id number] Yes Take 1 tablet (25 mg total) by mouth daily. Sherlene Shams, MD Taking Active   ondansetron (ZOFRAN ODT) 4 MG disintegrating tablet [id number] Yes Take 1 tablet (4 mg total) by mouth every 8 (eight) hours as needed for nausea or vomiting. Sherlene Shams, MD Taking Active Self  PARoxetine (PAXIL) 10 MG tablet [id number] Yes TAKE 1 TABLET(10 MG) BY MOUTH EVERY MORNING Sherlene Shams, MD Taking Active   polyethylene glycol (MIRALAX / GLYCOLAX) 17 g packet [id number] Yes Take 17 g by mouth 2 (two) times daily. Standley Brooking, MD Taking Active Self  Potassium 99 MG TABS [id number] Yes Take 99 mg by mouth daily. Reported on 09/20/2015 [provider] Taking Active Self           Med Note (CAULFIELD, ASHLEY L   Thu Dec 06, 2015 10:41 AM)    promethazine (PHENERGAN) 12.5 MG tablet [id number] Yes TAKE 1 TABLET (12.5 MG TOTAL) BY MOUTH AT BEDTIME AS NEEDED FOR NAUSEA OR VOMITING. Sherlene Shams, MD Taking Active Self           Med Note [name] Apr 24, 2020  3:54 PM)    rosuvastatin (CRESTOR) 40 MG tablet [id number] Yes TAKE 1 TABLET BY MOUTH EVERY DAY Sherlene Shams, MD Taking Active Self  Semaglutide,0.25 or 0.5MG /DOS, (OZEMPIC, 0.25 OR 0.5 MG/DOSE,) 2 MG/1.5ML SOPN [id number]  Yes Inject 0.5 mg into the skin once a week. Sherlene Shams, MD Taking Active            Med Note Lourena Simmonds   Fri Apr 06, 2020  1:59 PM)            Patient Active Problem List   Diagnosis Date Noted  . B12 deficiency 06/30/2020  . Heart palpitations 01/19/2020  . Palpitations 01/19/2020  . Obstipation 01/14/2020  . Hospital discharge follow-up 01/14/2020  . Colitis 01/07/2020  . Abdominal pain 01/07/2020  . Educated about COVID-19 virus infection 02/20/2019  . Microalbuminuria due to type 2 diabetes mellitus (HCC) 03/25/2018  . Bilateral chronic knee pain 12/20/2016  . Nausea in adult 12/20/2016  . Other specified nonscarring hair loss 12/20/2016  . Vitamin D deficiency 04/22/2016  . Tachycardia 11/21/2014  . Encounter for preventive health examination 09/26/2014  . Neck pain of over 3 months duration 09/26/2014  . Inflammatory arthritis 06/13/2014  . Elevated alkaline phosphatase measurement 10/09/2013  . Leg swelling 04/05/2013  . OSA (obstructive sleep apnea) 03/01/2013  . Allergic rhinitis 02/04/2013  . Pulmonary nodule 09/15/2012  . Chronic  hypoxemic respiratory failure (HCC) 05/03/2012  . COPD (chronic obstructive pulmonary disease) (HCC) 04/28/2012  . Diabetes mellitus type 2, uncontrolled, with complications (HCC) 11/07/2011  . GERD (gastroesophageal reflux disease)   . Obesity (BMI 30-39.9)   . Anxiety   . Tobacco abuse, in remission   . Screening for colon cancer   . Screening for breast cancer   . CAD (coronary artery disease) 12/02/2010  . HTN (hypertension) 12/02/2010  . Hyperlipidemia 12/02/2010    Medication Assistance: Application for Novo ([place], [place], and [place]) and [place] medication assistance program in process. Anticipated assistance start date 09/2020. See plan of care below for additional detail.   Patient Care Plan: Medication Management    Problem Identified: Diabetes, COPD, CHF     Long-Range Goal: Disease Progression Prevention   Note:   Current Barriers:  . Unable to independently afford treatment regimen . Unable to achieve control of diabetes   Pharmacist Clinical Goal(s):  Marland Kitchen Over the next 90 days, patient will verbalize ability to afford treatment regimen. . Over the next 90 days, patient will achieve control of diabetes as evidenced by improvement in A1c through collaboration with PharmD and provider.   Interventions: . Inter-disciplinary care team collaboration (see longitudinal plan of care) . Comprehensive medication review performed; medication list updated in electronic medical record  Diabetes: . Uncontrolled; current treatment: metformin 1000 mg BID, Ozempic 0.5 mg weekly, Tresiba 30 units daily, Novolog 10-14 units with meals  . Current glucose readings: fasting glucose: not checking as she immediately eats when she wakes up, post prandial glucose: 150-180s. Not using CGM as she forgot how to place it. Brought CGM with her to Dr. Melina Schools appointment, but then forgot to ask about how to use it.  . Scheduled RN visit for CGM teaching.  . Discussed increasing Ozempic to 1  mg weekly to aid in further weight loss, insulin reduction. Patient amenable. Discussed increased dose may increase sensation of fullness; encouraged to let me know if this becomes negatively impactful on her quality of life. Increase Ozempic to 1 mg weekly. [employer] refill form in now, will apply for increased dose with 2022 reapplication . Will collaborate w/ CPhT and provider on 2022 Novo reapplication  Hypertension: . Uncontrolled but possibly improved d/t medication adjustment; current treatment: lisinopril 20 mg daily (recently increased  by PCP), metoprolol succinate 25 mg daily, potassium 99 mg daily; follows w/ Dr. Milta DeitersS Khan . Current home readings: not checking. Reports she will start . Continue current regimen at this time. Will have BP checked when she comes in for CGM teaching in ~ 2 weeks, along with patient performing weekly BP home checks  Hyperlipidemia and ASCVD Risk Reduction . Controlled; current treatment: rosuvastatin 40 mg daily   . Antiplatelet regimen: aspirin 81 mg daily  . Recommended to continue current regimen at this tiem  Chronic Obstructive Pulmonary Disease: . Controlled; current treatment: Breztri 2 puffs BID, albuterol nebulizer ~3 times daily or albuterol HFA if she is out.  . Discussed that Proventil is no longer available from that patient assistance program. Will send albuterol refill today to her pharmacy. Completed online reapplication for [employer] assistance for [employer]. Will collaborate w/ CPhT for follow up . Wonders about order for portable oxygen. Encouraged her to call [employer] to follow up.  Depression/Anxiety: . Moderately well controlled at this time; current regimen: paroxetine 10 mg daily . Recommend to continue current regimen at this time. If concerns with control moving forward, would consider choosing an option w/ less anticholinergic activity.   Constipation: . Managed on current regimen. Denies worsening since being on Ozempic.  Lactulose Q3 days, Miralax PRN . Encouraged to continue current regimen with hydration. Encouraged to let me know if higher dose of Ozempic increases constipation.   Patient Goals/Self-Care Activities . Over the next 90 days, patient will:  - take medications as prescribed check blood glucose using CGM TID, document, and provide at future appointments check blood pressure weekly, document, and provide at future appointments collaborate with provider on medication access solutions  Follow Up Plan: Telephone follow up appointment with care management team member scheduled for:~ 6 weeks        Plan: Telephone follow up appointment with care management team member scheduled for: ~6 weeks  Catie Feliz Beamravis, PharmD, [place]ACP, CPP Clinical Pharmacist [facility] [phone]

## 2020-08-01 NOTE — Patient Instructions (Addendum)
Ms. Abrell,   It was great talking to you today!  I've submitted the order for the increased dose of Ozempic, 1 mg weekly. When you increase this, we may be able to reduce your insulin doses. Call me if you have issues with low blood sugars.   Keep an eye out for a packet from [employer] with the reapplication for Novo assistance for 2022.   We completed the online reapplication for Breztri from [employer] today.   Albuterol HFA is no longer available from assistance programs. I sent a refill to your pharmacy.    As always, call me with any questions or concerns! See below for the nurse visit to check your blood pressure and to re-teach on the [place]. Please bring the reader and sensors with you to that appointment.   Thanks!  [name], PharmD [phone]    Visit Information  Patient Care Plan: Medication Management    Problem Identified: Diabetes, COPD, CHF     Long-Range Goal: Disease Progression Prevention   Note:   Current Barriers:  . Unable to independently afford treatment regimen . Unable to achieve control of diabetes   Pharmacist Clinical Goal(s):  Marland Kitchen Over the next 90 days, patient will verbalize ability to afford treatment regimen. . Over the next 90 days, patient will achieve control of diabetes as evidenced by improvement in A1c through collaboration with PharmD and provider.   Interventions: . Inter-disciplinary care team collaboration (see longitudinal plan of care) . Comprehensive medication review performed; medication list updated in electronic medical record  Diabetes: . Uncontrolled; current treatment: metformin 1000 mg BID, Ozempic 0.5 mg weekly, Tresiba 30 units daily, Novolog 10-14 units with meals  . Current glucose readings: fasting glucose: not checking as she immediately eats when she wakes up, post prandial glucose: 150-180s. Not using CGM as she forgot how to place it. Brought CGM with her to Dr. Melina Schools appointment, but then forgot to ask  about how to use it.  . Scheduled RN visit for CGM teaching.  . Discussed increasing Ozempic to 1 mg weekly to aid in further weight loss, insulin reduction. Patient amenable. Discussed increased dose may increase sensation of fullness; encouraged to let me know if this becomes negatively impactful on her quality of life. Increase Ozempic to 1 mg weekly. [employer] Nord[employer] refill form in now, will apply for increased dose with 2022 reapplication . Will collaborate w/ CPhT and provider on 2022 Novo reapplication  Hypertension: . Uncontrolled but possibly improved d/t medication adjustment; current treatment: lisinopril 20 mg daily (recently increased by PCP), metoprolol succinate 25 mg daily, potassium 99 mg daily; follows w/ Dr. Milta Deiters . Current home readings: not checking. Reports she will start . Continue current regimen at this time. Will have BP checked when she comes in for CGM teaching in ~ 2 weeks, along with patient performing weekly BP home checks  Hyperlipidemia and ASCVD Risk Reduction . Controlled; current treatment: rosuvastatin 40 mg daily   . Antiplatelet regimen: aspirin 81 mg daily  . Recommended to continue current regimen at this tiem  Chronic Obstructive Pulmonary Disease: . Controlled; current treatment: Breztri 2 puffs BID, albuterol nebulizer ~3 times daily or albuterol HFA if she is out.  . Discussed that Proventil is no longer available from that patient assistance program. Will send albuterol refill today to her pharmacy. Completed online reapplication for [employer] assistance for [employer]. Will collaborate w/ CPhT for follow up . Wonders about order for portable oxygen. Encouraged her to  call [employer] to follow up.  Depression/Anxiety: . Moderately well controlled at this time; current regimen: paroxetine 10 mg daily . Recommend to continue current regimen at this time. If concerns with control moving forward, would consider choosing an option w/ less anticholinergic  activity.   Constipation: . Managed on current regimen. Denies worsening since being on Ozempic. Lactulose Q3 days, Miralax PRN . Encouraged to continue current regimen with hydration. Encouraged to let me know if higher dose of Ozempic increases constipation.   Patient Goals/Self-Care Activities . Over the next 90 days, patient will:  - take medications as prescribed check blood glucose using CGM TID, document, and provide at future appointments check blood pressure weekly, document, and provide at future appointments collaborate with provider on medication access solutions  Follow Up Plan: Telephone follow up appointment with care management team member scheduled for:~ 6 weeks       The patient verbalized understanding of instructions, educational materials, and care plan provided today and declined offer to receive copy of patient instructions, educational materials, and care plan.   Plan: Telephone follow up appointment with care management team member scheduled for: ~6 weeks  [name], PharmD, [place], CPP Clinical Pharmacist [facility] [employer] [phone]

## 2020-08-07 ENCOUNTER — Telehealth: Payer: Self-pay

## 2020-08-07 NOTE — Telephone Encounter (Signed)
Spoke with Lennox Solders and she stated that she needed a copy of pt's office note stating that she does use oxygen. Called pt and verified that she was aware of this company and she gave the permission to fax office note. Office note has been faxed to number given by [employer] [employer]. [phone]

## 2020-08-07 NOTE — Telephone Encounter (Signed)
Martia with [employer] called to ask if patient is on oxygen? She said pt has put in a request to get a travel unit for oxygen and before they can start to process the order they need to know if it is necessary. She would like a call back.

## 2020-08-08 ENCOUNTER — Telehealth: Payer: Self-pay | Admitting: Pharmacist

## 2020-08-08 ENCOUNTER — Ambulatory Visit: Payer: Medicare HMO | Admitting: Pharmacist

## 2020-08-08 DIAGNOSIS — I10 Essential (primary) hypertension: Secondary | ICD-10-CM

## 2020-08-08 DIAGNOSIS — R69 Illness, unspecified: Secondary | ICD-10-CM | POA: Diagnosis not present

## 2020-08-08 DIAGNOSIS — J432 Centrilobular emphysema: Secondary | ICD-10-CM

## 2020-08-08 DIAGNOSIS — I2510 Atherosclerotic heart disease of native coronary artery without angina pectoris: Secondary | ICD-10-CM

## 2020-08-08 NOTE — Patient Instructions (Signed)
Visit Information  Patient Care Plan: Medication Management    Problem Identified: Diabetes, COPD, CHF     Long-Range Goal: Disease Progression Prevention   This Visit's Progress: On track  Priority: High  Note:   Current Barriers:   Unable to independently afford treatment regimen  Unable to achieve control of diabetes   Pharmacist Clinical Goal(s):   Over the next 90 days, patient will verbalize ability to afford treatment regimen.  Over the next 90 days, patient will achieve control of diabetes as evidenced by improvement in A1c through collaboration with PharmD and provider.   Interventions:  Inter-disciplinary care team collaboration (see longitudinal plan of care)  Comprehensive medication review performed; medication list updated in electronic medical record  Diabetes:  Uncontrolled; current treatment: metformin 1000 mg BID, Ozempic 0.5 mg weekly, Tresiba 30 units daily, Novolog 10-14 units with meals   Current glucose readings: fasting glucose: not checking as she immediately eats when she wakes up, post prandial glucose: 150-180s.   Contacted [employer] to follow up on Ozempic 1 mg weekly order. Processed today, will ship in 10-14 business days. Will f/u on or after 08/29/20 if we have not received medication yet.   Hypertension:  Uncontrolled but possibly improved d/t medication adjustment; current treatment: lisinopril 20 mg daily (recently increased by PCP), metoprolol succinate 25 mg daily, potassium 99 mg daily; follows w/ Dr. Milta Deiters  Continue current regimen at this time.  Hyperlipidemia and ASCVD Risk Reduction  Controlled; current treatment: rosuvastatin 40 mg daily    Antiplatelet regimen: aspirin 81 mg daily   Recommended to continue current regimen at this tiem  Chronic Obstructive Pulmonary Disease:  Controlled; current treatment: Breztri 2 puffs BID, albuterol nebulizer ~3 times daily or albuterol HFA if she is out.   Continue current  regimen at this tie  Depression/Anxiety:  Moderately well controlled at this time; current regimen: paroxetine 10 mg daily  Recommend to continue current regimen at this time. If concerns with control moving forward, would consider choosing an option w/ less anticholinergic activity.   Constipation:  Managed on current regimen. Denies worsening since being on Ozempic. Lactulose Q3 days, Miralax PRN  Encouraged to continue current regimen with hydration.   Patient Goals/Self-Care Activities  Over the next 90 days, patient will:  - take medications as prescribed check blood glucose using CGM TID, document, and provide at future appointments check blood pressure weekly, document, and provide at future appointments collaborate with provider on medication access solutions  Follow Up Plan: Telephone follow up appointment with care management team member scheduled for:~ 5 weeks       The patient verbalized understanding of instructions, educational materials, and care plan provided today and declined offer to receive copy of patient instructions, educational materials, and care plan.   Plan: Telephone follow up appointment with care management team member scheduled for: ~ 5 weeks as previously scheduled  [name], PharmD, [place], CPP Clinical Pharmacist [facility] [employer] [phone]

## 2020-08-08 NOTE — Chronic Care Management (AMB) (Signed)
Chronic Care Management   Pharmacy Note  08/08/2020 Name: Catherine [name] MRN: [id number] DOB: 1955-10-17   Subjective:  Catherine [name] is a 64 y.o. year old female who is a primary care patient of [name], [name], MD. The CCM team was consulted for assistance with chronic disease management and care coordination needs.    Care coordination completed today in response to provider referral for pharmacy case management and/or care coordination services.   Consent to Services:  [name] was given information about Chronic Care Management services, agreed to services, and gave verbal consent prior to initiation of services on 09/15/19. Please see initial visit note for detailed documentation.   SDOH (Social Determinants of Health) assessments and interventions performed:  SDOH Interventions     Most Recent Value  SDOH Interventions  Financial Strain Interventions Other (Comment)  [manufacturer assistance]       Objective:  Lab Results  Component Value Date   CREATININE 0.78 06/26/2020   CREATININE 0.72 01/19/2020   CREATININE 0.63 01/16/2020    Lab Results  Component Value Date   HGBA1C 7.2 (A) 06/26/2020       Component Value Date/Time   CHOL 141 06/26/2020 1441   CHOL 185 11/11/2011 0914   TRIG 211.0 (H) 06/26/2020 1441   TRIG 166 11/11/2011 0914   HDL 39.10 06/26/2020 1441   HDL 47 11/11/2011 0914   CHOLHDL 4 06/26/2020 1441   VLDL 42.2 (H) 06/26/2020 1441   VLDL 33 11/11/2011 0914   LDLCALC 73 12/10/2018 1447   LDLCALC 105 (H) 11/11/2011 0914   LDLDIRECT 74.0 06/26/2020 1441    BP Readings from Last 3 Encounters:  06/26/20 (!) 164/84  01/19/20 (!) 142/95  01/16/20 (!) 167/79    Assessment/Interventions: Review of patient past medical history, allergies, medications, health status, including review of consultants reports, laboratory and other test data, was performed as part of comprehensive evaluation and provision of chronic care management  services.   Allergies  Allergen Reactions   Sulfa Antibiotics Swelling    Patient reports caused cellulitis Other reaction(s): Other (See Comments) cellulitis   Hydroxychloroquine     Other reaction(s): Other (See Comments) Gi upset   Oxycodone-Acetaminophen    Percocet [Oxycodone-Acetaminophen]    Vicodin [Hydrocodone-Acetaminophen]    Aspirin Other (See Comments)    Other reaction(s): Blood Disorder   Ibuprofen     Other reaction(s): Blood Disorder   Omnipaque [Iohexol] Itching    Patient states she has had IV contrast multiple times without any reaction. But she said on Jan 07, 2020 after injection she had terrible itching.  They gave her Benadryl which stopped it immediately.  Told Dr. Don Perking this and she elected to give 50 mg IV Benadryl before scan today 01-19-2020.  She had no reaction after injection today. It wasn't documented on 01-07-2020.    Medications Reviewed Today    Reviewed by Lourena Simmonds, RPH-CPP (Pharmacist) on 08/01/20 at 1424  Med List Status: <None>  Medication Order Taking? Sig Documenting Provider Last Dose Status Informant  acetaminophen (TYLENOL) 325 MG tablet [id number] Yes Take 650 mg by mouth every 6 (six) hours as needed. PRN headache [provider] Taking Active Self  albuterol (VENTOLIN HFA) 108 (90 Base) MCG/ACT inhaler [id number] Yes USE 2 PUFFS EVERY 6 HOURS AS NEEDED FOR WHEEZING Glenford Bayley, NP Taking Active   aspirin 81 MG EC tablet [id number] Yes Take 81 mg by mouth daily.   [provider] Taking Active Self  Budeson-Glycopyrrol-Formoterol (BREZTRI AEROSPHERE)  160-9-4.8 MCG/ACT AERO [id number] Yes Inhale 2 puffs into the lungs in the morning and at bedtime. [provider] Taking Active   Cholecalciferol (VITAMIN D3) 125 MCG (5000 UT) CAPS [id number] Yes Take 1,000 Units by mouth daily.  [provider] Taking Active Self  insulin aspart (NOVOLOG) 100 UNIT/ML injection [id number] Yes Inject 6  Units into the skin 3 (three) times daily before meals. Sherlene Shams, MD Taking Active            Med Note [name], [name]   Wed Aug 01, 2020  2:17 PM) 10-14 units before meals  insulin degludec (TRESIBA FLEXTOUCH) 100 UNIT/ML FlexTouch Pen [id number] Yes Inject 0.3 mLs (30 Units total) into the skin daily. Sherlene Shams, MD Taking Active            Med Note [name] May 15, 2020  3:47 PM)    ipratropium-albuterol (DUONEB) 0.5-2.5 (3) MG/3ML SOLN [id number] Yes USE 1 VIAL VIA NEBULIZER EVERY 6 HOURS AS NEEDED Sherlene Shams, MD Taking Active   Lactobacillus (PROBIOTIC ACIDOPHILUS PO) [id number] Yes Take 1 capsule by mouth daily. [provider] Taking Active Self  lactulose (CHRONULAC) 10 GM/15ML solution [id number] Yes 30 ML EVERY 4 HOURS UNTIL CONSTIPATION IS RELIEVED Sherlene Shams, MD Taking Active   lisinopril (ZESTRIL) 20 MG tablet [id number] Yes Take 1 tablet (20 mg total) by mouth daily. Sherlene Shams, MD Taking Active   magnesium oxide (MAG-OX) 400 MG tablet [id number] Yes Take 400 mg by mouth every other day. [provider] Taking Active Self  Melatonin 5 MG TABS [id number] Yes Take 5 mg by mouth at bedtime. [provider] Taking Active Self  metFORMIN (GLUCOPHAGE) 500 MG tablet [id number] Yes TAKE 1 TABLET (500 MG TOTAL) BY MOUTH 2 (TWO) TIMES DAILY WITH A MEAL.  Patient taking differently: Take 1,000 mg by mouth 2 (two) times daily with a meal.    Sherlene Shams, MD Taking Active   metoprolol succinate (TOPROL-XL) 25 MG 24 hr tablet [id number] Yes Take 1 tablet (25 mg total) by mouth daily. Sherlene Shams, MD Taking Active   ondansetron (ZOFRAN ODT) 4 MG disintegrating tablet [id number] Yes Take 1 tablet (4 mg total) by mouth every 8 (eight) hours as needed for nausea or vomiting. Sherlene Shams, MD Taking Active Self  PARoxetine (PAXIL) 10 MG tablet [id number] Yes TAKE 1 TABLET(10 MG) BY MOUTH EVERY MORNING Sherlene Shams, MD Taking  Active   polyethylene glycol (MIRALAX / GLYCOLAX) 17 g packet [id number] Yes Take 17 g by mouth 2 (two) times daily. Standley Brooking, MD Taking Active Self  Potassium 99 MG TABS [id number] Yes Take 99 mg by mouth daily. Reported on 09/20/2015 [provider] Taking Active Self           Med Note (CAULFIELD, ASHLEY L   Thu Dec 06, 2015 10:41 AM)    promethazine (PHENERGAN) 12.5 MG tablet [id number] Yes TAKE 1 TABLET (12.5 MG TOTAL) BY MOUTH AT BEDTIME AS NEEDED FOR NAUSEA OR VOMITING. Sherlene Shams, MD Taking Active Self           Med Note [name] Apr 24, 2020  3:54 PM)    rosuvastatin (CRESTOR) 40 MG tablet [id number] Yes TAKE 1 TABLET BY MOUTH EVERY DAY Sherlene Shams, MD Taking Active Self  Semaglutide,0.25 or 0.5MG /DOS, (OZEMPIC, 0.25 OR 0.5 MG/DOSE,) 2 MG/1.5ML SOPN [id number] Yes Inject 0.5 mg into  the skin once a week. Sherlene Shams, MD Taking Active            Med Note Lourena Simmonds   Fri Apr 06, 2020  1:59 PM)            Patient Active Problem List   Diagnosis Date Noted   B12 deficiency 06/30/2020   Heart palpitations 01/19/2020   Palpitations 01/19/2020   Obstipation 01/14/2020   Hospital discharge follow-up 01/14/2020   Colitis 01/07/2020   Abdominal pain 01/07/2020   Educated about COVID-19 virus infection 02/20/2019   Microalbuminuria due to type 2 diabetes mellitus (HCC) 03/25/2018   Bilateral chronic knee pain 12/20/2016   Nausea in adult 12/20/2016   Other specified nonscarring hair loss 12/20/2016   Vitamin D deficiency 04/22/2016   Tachycardia 11/21/2014   Encounter for preventive health examination 09/26/2014   Neck pain of over 3 months duration 09/26/2014   Inflammatory arthritis 06/13/2014   Elevated alkaline phosphatase measurement 10/09/2013   Leg swelling 04/05/2013   OSA (obstructive sleep apnea) 03/01/2013   Allergic rhinitis 02/04/2013   Pulmonary nodule 09/15/2012   Chronic hypoxemic  respiratory failure (HCC) 05/03/2012   COPD (chronic obstructive pulmonary disease) (HCC) 04/28/2012   Diabetes mellitus type 2, uncontrolled, with complications (HCC) 11/07/2011   GERD (gastroesophageal reflux disease)    Obesity (BMI 30-39.9)    Anxiety    Tobacco abuse, in remission    Screening for colon cancer    Screening for breast cancer    CAD (coronary artery disease) 12/02/2010   HTN (hypertension) 12/02/2010   Hyperlipidemia 12/02/2010    Medication Assistance: Application for Suzie Portela, Tresiba medication assistance program in process. Anticipated assistance start date TBD. See plan of care below for additional detail.   Patient Care Plan: Medication Management    Problem Identified: Diabetes, COPD, CHF     Long-Range Goal: Disease Progression Prevention   This Visit's Progress: On track  Priority: High  Note:   Current Barriers:   Unable to independently afford treatment regimen  Unable to achieve control of diabetes   Pharmacist Clinical Goal(s):   Over the next 90 days, patient will verbalize ability to afford treatment regimen.  Over the next 90 days, patient will achieve control of diabetes as evidenced by improvement in A1c through collaboration with PharmD and provider.   Interventions:  Inter-disciplinary care team collaboration (see longitudinal plan of care)  Comprehensive medication review performed; medication list updated in electronic medical record  Diabetes:  Uncontrolled; current treatment: metformin 1000 mg BID, Ozempic 0.5 mg weekly, Tresiba 30 units daily, Novolog 10-14 units with meals   Current glucose readings: fasting glucose: not checking as she immediately eats when she wakes up, post prandial glucose: 150-180s.   Contacted [facility] to follow up on Ozempic 1 mg weekly order. Processed today, will ship in 10-14 business days. Will f/u on or after 08/29/20 if we have not received medication yet.    Hypertension:  Uncontrolled but possibly improved d/t medication adjustment; current treatment: lisinopril 20 mg daily (recently increased by PCP), metoprolol succinate 25 mg daily, potassium 99 mg daily; follows w/ Dr. Milta Deiters  Continue current regimen at this time.  Hyperlipidemia and ASCVD Risk Reduction  Controlled; current treatment: rosuvastatin 40 mg daily    Antiplatelet regimen: aspirin 81 mg daily   Recommended to continue current regimen at this tiem  Chronic Obstructive Pulmonary Disease:  Controlled; current treatment: Breztri 2 puffs BID, albuterol nebulizer ~3 times daily or  albuterol HFA if she is out.   Continue current regimen at this tie  Depression/Anxiety:  Moderately well controlled at this time; current regimen: paroxetine 10 mg daily  Recommend to continue current regimen at this time. If concerns with control moving forward, would consider choosing an option w/ less anticholinergic activity.   Constipation:  Managed on current regimen. Denies worsening since being on Ozempic. Lactulose Q3 days, Miralax PRN  Encouraged to continue current regimen with hydration.   Patient Goals/Self-Care Activities  Over the next 90 days, patient will:  - take medications as prescribed check blood glucose using CGM TID, document, and provide at future appointments check blood pressure weekly, document, and provide at future appointments collaborate with provider on medication access solutions  Follow Up Plan: Telephone follow up appointment with care management team member scheduled for:~ 5 weeks        Plan: Telephone follow up appointment with care management team member scheduled for: ~ 5 weeks as previously scheduled  Catie Feliz Beamravis, PharmD, [place]BCACP, CPP Clinical Pharmacist [facility] [place] [facility] [phone]

## 2020-08-08 NOTE — Telephone Encounter (Signed)
error 

## 2020-08-15 ENCOUNTER — Ambulatory Visit: Payer: Medicare HMO

## 2020-08-15 ENCOUNTER — Other Ambulatory Visit: Payer: Self-pay

## 2020-08-15 VITALS — BP 154/78 | HR 91

## 2020-08-15 DIAGNOSIS — I10 Essential (primary) hypertension: Secondary | ICD-10-CM

## 2020-08-15 DIAGNOSIS — E1165 Type 2 diabetes mellitus with hyperglycemia: Secondary | ICD-10-CM

## 2020-08-15 DIAGNOSIS — IMO0002 Reserved for concepts with insufficient information to code with codable children: Secondary | ICD-10-CM

## 2020-08-15 NOTE — Progress Notes (Signed)
Patient is here for a BP check due to bp being high at last visit, as per patient.  Currently patients BP is 154/78 and BPM is 91.   Patient has no complaints of headaches, blurry vision, chest pain, arm pain, light headedness, dizziness, and nor jaw pain. Please see previous note for order.   Patient presented for [facility] placement and instruction. Patient voiced no concerns nor showed signs of distress during placement. Also instructed patient on how to record and give access to [place] [facility] so we can have accurate monitoring system. Instructed patient that if further questions, they can call clinic to be given further direction.

## 2020-08-21 ENCOUNTER — Telehealth: Payer: Self-pay

## 2020-08-21 DIAGNOSIS — E1165 Type 2 diabetes mellitus with hyperglycemia: Secondary | ICD-10-CM | POA: Diagnosis not present

## 2020-08-21 NOTE — Telephone Encounter (Signed)
Received 4 boxes of patients Ozempic and 1 box of patients NovoLog Flexpen. I have called and spoke to [facility] and she is informed that we have her medication. She states that she will be by to pick it up on 08/22/2020.

## 2020-08-22 ENCOUNTER — Ambulatory Visit: Payer: Medicare HMO | Admitting: Pharmacist

## 2020-08-22 DIAGNOSIS — J432 Centrilobular emphysema: Secondary | ICD-10-CM

## 2020-08-22 DIAGNOSIS — I2510 Atherosclerotic heart disease of native coronary artery without angina pectoris: Secondary | ICD-10-CM

## 2020-08-22 DIAGNOSIS — I10 Essential (primary) hypertension: Secondary | ICD-10-CM

## 2020-08-22 DIAGNOSIS — IMO0002 Reserved for concepts with insufficient information to code with codable children: Secondary | ICD-10-CM

## 2020-08-22 NOTE — Chronic Care Management (AMB) (Signed)
Chronic Care Management   Pharmacy Note  08/22/2020 Name: Catherine [name] MRN: [id number] DOB: [birth year]  Subjective:  Catherine [name] is a 64 y.o. year old female who is a primary care patient of [name], [name], MD. The CCM team was consulted for assistance with chronic disease management and care coordination needs.    Engaged with patient face to face for follow up visit in response to provider referral for pharmacy case management and/or care coordination services.   Consent to Services:  [name] was given information about Chronic Care Management services, agreed to services, and gave verbal consent prior to initiation of services on 09/15/19. Please see initial visit note for detailed documentation.   Objective:  Lab Results  Component Value Date   CREATININE 0.78 06/26/2020   CREATININE 0.72 01/19/2020   CREATININE 0.63 01/16/2020    Lab Results  Component Value Date   HGBA1C 7.2 (A) 06/26/2020       Component Value Date/Time   CHOL 141 06/26/2020 1441   CHOL 185 11/11/2011 0914   TRIG 211.0 (H) 06/26/2020 1441   TRIG 166 11/11/2011 0914   HDL 39.10 06/26/2020 1441   HDL 47 11/11/2011 0914   CHOLHDL 4 06/26/2020 1441   VLDL 42.2 (H) 06/26/2020 1441   VLDL 33 11/11/2011 0914   LDLCALC 73 12/10/2018 1447   LDLCALC 105 (H) 11/11/2011 0914   LDLDIRECT 74.0 06/26/2020 1441     BP Readings from Last 3 Encounters:  08/15/20 (!) 154/78  06/26/20 (!) 164/84  01/19/20 (!) 142/95    Assessment/Interventions: Review of patient past medical history, allergies, medications, health status, including review of consultants reports, laboratory and other test data, was performed as part of comprehensive evaluation and provision of chronic care management services.   SDOH (Social Determinants of Health) assessments and interventions performed:  SDOH Interventions   Flowsheet Row Most Recent Value  SDOH Interventions   Financial Strain Interventions Other  (Comment)  [manufacturer assistance]       CCM Care Plan  Allergies  Allergen Reactions  . Sulfa Antibiotics Swelling    Patient reports caused cellulitis Other reaction(s): Other (See Comments) cellulitis  . Hydroxychloroquine     Other reaction(s): Other (See Comments) Gi upset  . Oxycodone-Acetaminophen   . Percocet [Oxycodone-Acetaminophen]   . Vicodin [Hydrocodone-Acetaminophen]   . Aspirin Other (See Comments)    Other reaction(s): Blood Disorder  . Ibuprofen     Other reaction(s): Blood Disorder  . Omnipaque [Iohexol] Itching    Patient states she has had IV contrast multiple times without any reaction. But she said on Jan 07, 2020 after injection she had terrible itching.  They gave her Benadryl which stopped it immediately.  Told Dr. Don Perking this and she elected to give 50 mg IV Benadryl before scan today 01-19-2020.  She had no reaction after injection today. It wasn't documented on 01-07-2020.    Medications Reviewed Today    Reviewed by Lourena Simmonds, RPH-CPP (Pharmacist) on 08/01/20 at 1424  Med List Status: <None>  Medication Order Taking? Sig Documenting Provider Last Dose Status Informant  acetaminophen (TYLENOL) 325 MG tablet [id number] Yes Take 650 mg by mouth every 6 (six) hours as needed. PRN headache [provider] Taking Active Self  albuterol (VENTOLIN HFA) 108 (90 Base) MCG/ACT inhaler [id number] Yes USE 2 PUFFS EVERY 6 HOURS AS NEEDED FOR WHEEZING Glenford Bayley, NP Taking Active   aspirin 81 MG EC tablet [id number] Yes Take 81 mg by mouth daily.  [provider] Taking Active Self  Budeson-Glycopyrrol-Formoterol (BREZTRI AEROSPHERE) 160-9-4.8 MCG/ACT AERO [id number] Yes Inhale 2 puffs into the lungs in the morning and at bedtime. [provider] Taking Active   Cholecalciferol (VITAMIN D3) 125 MCG (5000 UT) CAPS [id number] Yes Take 1,000 Units by mouth daily.  [provider] Taking Active Self  insulin aspart  (NOVOLOG) 100 UNIT/ML injection [id number] Yes Inject 6 Units into the skin 3 (three) times daily before meals. Sherlene Shams, MD Taking Active            Med Note [name], [name]   Wed Aug 01, 2020  2:17 PM) 10-14 units before meals  insulin degludec (TRESIBA FLEXTOUCH) 100 UNIT/ML FlexTouch Pen [id number] Yes Inject 0.3 mLs (30 Units total) into the skin daily. Sherlene Shams, MD Taking Active            Med Note [name] May 15, 2020  3:47 PM)    ipratropium-albuterol (DUONEB) 0.5-2.5 (3) MG/3ML SOLN [id number] Yes USE 1 VIAL VIA NEBULIZER EVERY 6 HOURS AS NEEDED Sherlene Shams, MD Taking Active   Lactobacillus (PROBIOTIC ACIDOPHILUS PO) [id number] Yes Take 1 capsule by mouth daily. [provider] Taking Active Self  lactulose (CHRONULAC) 10 GM/15ML solution [id number] Yes 30 ML EVERY 4 HOURS UNTIL CONSTIPATION IS RELIEVED Sherlene Shams, MD Taking Active   lisinopril (ZESTRIL) 20 MG tablet [id number] Yes Take 1 tablet (20 mg total) by mouth daily. Sherlene Shams, MD Taking Active   magnesium oxide (MAG-OX) 400 MG tablet [id number] Yes Take 400 mg by mouth every other day. [provider] Taking Active Self  Melatonin 5 MG TABS [id number] Yes Take 5 mg by mouth at bedtime. [provider] Taking Active Self  metFORMIN (GLUCOPHAGE) 500 MG tablet [id number] Yes TAKE 1 TABLET (500 MG TOTAL) BY MOUTH 2 (TWO) TIMES DAILY WITH A MEAL.  Patient taking differently: Take 1,000 mg by mouth 2 (two) times daily with a meal.    Sherlene Shams, MD Taking Active   metoprolol succinate (TOPROL-XL) 25 MG 24 hr tablet [id number] Yes Take 1 tablet (25 mg total) by mouth daily. Sherlene Shams, MD Taking Active   ondansetron (ZOFRAN ODT) 4 MG disintegrating tablet [id number] Yes Take 1 tablet (4 mg total) by mouth every 8 (eight) hours as needed for nausea or vomiting. Sherlene Shams, MD Taking Active Self  PARoxetine (PAXIL) 10 MG tablet [id number] Yes TAKE 1 TABLET(10  MG) BY MOUTH EVERY MORNING Sherlene Shams, MD Taking Active   polyethylene glycol (MIRALAX / GLYCOLAX) 17 g packet [id number] Yes Take 17 g by mouth 2 (two) times daily. Standley Brooking, MD Taking Active Self  Potassium 99 MG TABS [id number] Yes Take 99 mg by mouth daily. Reported on 09/20/2015 [provider] Taking Active Self           Med Note (CAULFIELD, ASHLEY L   Thu Dec 06, 2015 10:41 AM)    promethazine (PHENERGAN) 12.5 MG tablet [id number] Yes TAKE 1 TABLET (12.5 MG TOTAL) BY MOUTH AT BEDTIME AS NEEDED FOR NAUSEA OR VOMITING. Sherlene Shams, MD Taking Active Self           Med Note [name] Apr 24, 2020  3:54 PM)    rosuvastatin (CRESTOR) 40 MG tablet [id number] Yes TAKE 1 TABLET BY MOUTH EVERY DAY Sherlene Shams, MD Taking Active Self  Semaglutide,0.25 or 0.5MG /DOS, (OZEMPIC, 0.25 OR 0.5  MG/DOSE,) 2 MG/1.5ML SOPN [id number] Yes Inject 0.5 mg into the skin once a week. Sherlene Shams, MD Taking Active            Med Note Lourena Simmonds   Fri Apr 06, 2020  1:59 PM)            Patient Active Problem List   Diagnosis Date Noted  . B12 deficiency 06/30/2020  . Heart palpitations 01/19/2020  . Palpitations 01/19/2020  . Obstipation 01/14/2020  . Hospital discharge follow-up 01/14/2020  . Colitis 01/07/2020  . Abdominal pain 01/07/2020  . Educated about COVID-19 virus infection 02/20/2019  . Microalbuminuria due to type 2 diabetes mellitus (HCC) 03/25/2018  . Bilateral chronic knee pain 12/20/2016  . Nausea in adult 12/20/2016  . Other specified nonscarring hair loss 12/20/2016  . Vitamin D deficiency 04/22/2016  . Tachycardia 11/21/2014  . Encounter for preventive health examination 09/26/2014  . Neck pain of over 3 months duration 09/26/2014  . Inflammatory arthritis 06/13/2014  . Elevated alkaline phosphatase measurement 10/09/2013  . Leg swelling 04/05/2013  . OSA (obstructive sleep apnea) 03/01/2013  . Allergic rhinitis 02/04/2013  .  Pulmonary nodule 09/15/2012  . Chronic hypoxemic respiratory failure (HCC) 05/03/2012  . COPD (chronic obstructive pulmonary disease) (HCC) 04/28/2012  . Diabetes mellitus type 2, uncontrolled, with complications (HCC) 11/07/2011  . GERD (gastroesophageal reflux disease)   . Obesity (BMI 30-39.9)   . Anxiety   . Tobacco abuse, in remission   . Screening for colon cancer   . Screening for breast cancer   . CAD (coronary artery disease) 12/02/2010  . HTN (hypertension) 12/02/2010  . Hyperlipidemia 12/02/2010    Conditions to be addressed/monitored per PCP order: HTN, HLD, COPD and DMII  Patient Care Plan: Medication Management    Problem Identified: Diabetes, COPD, CHF     Long-Range Goal: Disease Progression Prevention   Recent Progress: On track  Priority: High  Note:   Current Barriers:  . Unable to independently afford treatment regimen . Unable to achieve control of diabetes   Pharmacist Clinical Goal(s):  Marland Kitchen Over the next 90 days, patient will verbalize ability to afford treatment regimen. . Over the next 90 days, patient will achieve control of diabetes as evidenced by improvement in A1c through collaboration with PharmD and provider.   Interventions: . Inter-disciplinary care team collaboration (see longitudinal plan of care) . Comprehensive medication review performed; medication list updated in electronic medical record  Diabetes: . Uncontrolled; current treatment: metformin 1000 mg BID, Ozempic 0.5 mg weekly, Tresiba 30 units daily, Novolog 10-14 units with meals  . Current glucose readings: fasting glucose: not checking as she immediately eats when she wakes up, post prandial glucose: 150-180s. . Due to reapply for patient assistance for Novo for 2022. Received patient and provider portions. Submitted on behalf of patient today. Will collaborate w/ CPhT for follow up  Hypertension: . Uncontrolled but possibly improved d/t medication adjustment; current treatment:  lisinopril 20 mg daily (recently increased by PCP), metoprolol succinate 25 mg daily, potassium 99 mg daily; follows w/ Dr. Milta Deiters . Continue current regimen at this time.  Hyperlipidemia and ASCVD Risk Reduction . Controlled; current treatment: rosuvastatin 40 mg daily   . Antiplatelet regimen: aspirin 81 mg daily  . Recommended to continue current regimen at this tiem  Chronic Obstructive Pulmonary Disease: . Controlled; current treatment: Breztri 2 puffs BID, albuterol nebulizer ~3 times daily or albuterol HFA if she is out.  . Continue current regimen at  this tie  Depression/Anxiety: . Moderately well controlled at this time; current regimen: paroxetine 10 mg daily . Recommend to continue current regimen at this time. If concerns with control moving forward, would consider choosing an option w/ less anticholinergic activity.   Constipation: . Managed on current regimen. Denies worsening since being on Ozempic. Lactulose Q3 days, Miralax PRN . Encouraged to continue current regimen with hydration.   Patient Goals/Self-Care Activities . Over the next 90 days, patient will:  - take medications as prescribed check blood glucose using CGM TID, document, and provide at future appointments check blood pressure weekly, document, and provide at future appointments collaborate with provider on medication access solutions  Follow Up Plan: Telephone follow up appointment with care management team member scheduled for:~ 3 weeks       Medication Assistance: Application for Novo ([place]Ozempic, [place], [place]olog) medication assistance program in process. Anticipated assistance start date TBD. See plan of care above for additional detail.   Plan: Telephone follow up appointment with care management team member scheduled for:~ 3 weeks   Catie Feliz Beamravis, PharmD, [place]BCACP, CPP Clinical Pharmacist [facility] [place] [facility] [phone]

## 2020-08-22 NOTE — Patient Instructions (Signed)
Visit Information  Patient Care Plan: Medication Management    Problem Identified: Diabetes, COPD, CHF     Long-Range Goal: Disease Progression Prevention   Recent Progress: On track  Priority: High  Note:   Current Barriers:  . Unable to independently afford treatment regimen . Unable to achieve control of diabetes   Pharmacist Clinical Goal(s):  Marland Kitchen Over the next 90 days, patient will verbalize ability to afford treatment regimen. . Over the next 90 days, patient will achieve control of diabetes as evidenced by improvement in A1c through collaboration with PharmD and provider.   Interventions: . Inter-disciplinary care team collaboration (see longitudinal plan of care) . Comprehensive medication review performed; medication list updated in electronic medical record  Diabetes: . Uncontrolled; current treatment: metformin 1000 mg BID, Ozempic 0.5 mg weekly, Tresiba 30 units daily, Novolog 10-14 units with meals  . Current glucose readings: fasting glucose: not checking as she immediately eats when she wakes up, post prandial glucose: 150-180s. . Due to reapply for patient assistance for Novo for 2022. Received patient and provider portions. Submitted on behalf of patient today. Will collaborate w/ CPhT for follow up  Hypertension: . Uncontrolled but possibly improved d/t medication adjustment; current treatment: lisinopril 20 mg daily (recently increased by PCP), metoprolol succinate 25 mg daily, potassium 99 mg daily; follows w/ Dr. Milta Deiters . Continue current regimen at this time.  Hyperlipidemia and ASCVD Risk Reduction . Controlled; current treatment: rosuvastatin 40 mg daily   . Antiplatelet regimen: aspirin 81 mg daily  . Recommended to continue current regimen at this tiem  Chronic Obstructive Pulmonary Disease: . Controlled; current treatment: Breztri 2 puffs BID, albuterol nebulizer ~3 times daily or albuterol HFA if she is out.  . Continue current regimen at this  tie  Depression/Anxiety: . Moderately well controlled at this time; current regimen: paroxetine 10 mg daily . Recommend to continue current regimen at this time. If concerns with control moving forward, would consider choosing an option w/ less anticholinergic activity.   Constipation: . Managed on current regimen. Denies worsening since being on Ozempic. Lactulose Q3 days, Miralax PRN . Encouraged to continue current regimen with hydration.   Patient Goals/Self-Care Activities . Over the next 90 days, patient will:  - take medications as prescribed check blood glucose using CGM TID, document, and provide at future appointments check blood pressure weekly, document, and provide at future appointments collaborate with provider on medication access solutions  Follow Up Plan: Telephone follow up appointment with care management team member scheduled for:~ 3 weeks        The patient verbalized understanding of instructions, educational materials, and care plan provided today and declined offer to receive copy of patient instructions, educational materials, and care plan.   Plan: Telephone follow up appointment with care management team member scheduled for:~ 3 weeks   [name], PharmD, [place], CPP Clinical Pharmacist [facility] at [employer] [facility] [phone]

## 2020-08-23 ENCOUNTER — Telehealth: Payer: Self-pay | Admitting: Internal Medicine

## 2020-08-23 NOTE — Telephone Encounter (Signed)
Pt was advised that she needs to be evaluated at [facility] because Dr. Darrick Huntsman is not of the office until next week and no other provider in the office has any appts for tomorrow. Pt gave a verbal understanding.

## 2020-08-23 NOTE — Telephone Encounter (Signed)
Patient called wanted to let Dr.[name] know her husband tested positive for covid and she is sick

## 2020-08-24 NOTE — Telephone Encounter (Signed)
Pt wanted to let you know that she is taking Corcidin, Mucinex, and Tylenol for fever. She will call back if any new symptoms comes up. FYI

## 2020-08-25 DIAGNOSIS — J449 Chronic obstructive pulmonary disease, unspecified: Secondary | ICD-10-CM | POA: Diagnosis not present

## 2020-08-25 DIAGNOSIS — G839 Paralytic syndrome, unspecified: Secondary | ICD-10-CM | POA: Diagnosis not present

## 2020-08-26 ENCOUNTER — Other Ambulatory Visit: Payer: Self-pay | Admitting: Internal Medicine

## 2020-08-27 ENCOUNTER — Telehealth: Payer: Medicare HMO

## 2020-09-05 DIAGNOSIS — R69 Illness, unspecified: Secondary | ICD-10-CM | POA: Diagnosis not present

## 2020-09-14 ENCOUNTER — Ambulatory Visit: Payer: Medicare HMO | Admitting: Pharmacist

## 2020-09-14 DIAGNOSIS — I2510 Atherosclerotic heart disease of native coronary artery without angina pectoris: Secondary | ICD-10-CM

## 2020-09-14 DIAGNOSIS — E1165 Type 2 diabetes mellitus with hyperglycemia: Secondary | ICD-10-CM

## 2020-09-14 DIAGNOSIS — IMO0002 Reserved for concepts with insufficient information to code with codable children: Secondary | ICD-10-CM

## 2020-09-14 DIAGNOSIS — J432 Centrilobular emphysema: Secondary | ICD-10-CM

## 2020-09-14 DIAGNOSIS — I10 Essential (primary) hypertension: Secondary | ICD-10-CM

## 2020-09-14 MED ORDER — BREZTRI AEROSPHERE 160-9-4.8 MCG/ACT IN AERO: 2.0000 | INHALATION_SPRAY | Freq: Two times a day (BID) | RESPIRATORY_TRACT | 3 refills | Status: AC

## 2020-09-14 NOTE — Chronic Care Management (AMB) (Signed)
Chronic Care Management   Pharmacy Note  09/14/2020 Name: Catherine [name] MRN: [id number] DOB: 1955-10-28  Subjective:  Catherine [name] is a 65 y.o. year old female who is a primary care patient of [name], Aris Everts, MD. The CCM team was consulted for assistance with chronic disease management and care coordination needs.    Engaged with patient by telephone for follow up visit in response to provider referral for pharmacy case management and/or care coordination services.   Consent to Services:  Patient was given information about Chronic Care Management services, agreed to services, and gave verbal consent prior to initiation of services on 09/15/19. Please see initial visit note for detailed documentation.   Objective:  Lab Results  Component Value Date   CREATININE 0.78 06/26/2020   CREATININE 0.72 01/19/2020   CREATININE 0.63 01/16/2020    Lab Results  Component Value Date   HGBA1C 7.2 (A) 06/26/2020       Component Value Date/Time   CHOL 141 06/26/2020 1441   CHOL 185 11/11/2011 0914   TRIG 211.0 (H) 06/26/2020 1441   TRIG 166 11/11/2011 0914   HDL 39.10 06/26/2020 1441   HDL 47 11/11/2011 0914   CHOLHDL 4 06/26/2020 1441   VLDL 42.2 (H) 06/26/2020 1441   VLDL 33 11/11/2011 0914   LDLCALC 73 12/10/2018 1447   LDLCALC 105 (H) 11/11/2011 0914   LDLDIRECT 74.0 06/26/2020 1441     BP Readings from Last 3 Encounters:  08/15/20 (!) 154/78  06/26/20 (!) 164/84  01/19/20 (!) 142/95     Assessment/Interventions: Review of patient past medical history, allergies, medications, health status, including review of consultants reports, laboratory and other test data, was performed as part of comprehensive evaluation and provision of chronic care management services.   SDOH (Social Determinants of Health) assessments and interventions performed:  SDOH Interventions   Flowsheet Row Most Recent Value  SDOH Interventions   Financial Strain Interventions Other (Comment)   [manufacturer assistance]       CCM Care Plan  Allergies  Allergen Reactions  . Sulfa Antibiotics Swelling    Patient reports caused cellulitis Other reaction(s): Other (See Comments) cellulitis  . Hydroxychloroquine     Other reaction(s): Other (See Comments) Gi upset  . Oxycodone-Acetaminophen   . Percocet [Oxycodone-Acetaminophen]   . Vicodin [Hydrocodone-Acetaminophen]   . Aspirin Other (See Comments)    Other reaction(s): Blood Disorder  . Ibuprofen     Other reaction(s): Blood Disorder  . Omnipaque [Iohexol] Itching    Patient states she has had IV contrast multiple times without any reaction. But she said on Jan 07, 2020 after injection she had terrible itching.  They gave her Benadryl which stopped it immediately.  Told Dr. Alfred Levins this and she elected to give 50 mg IV Benadryl before scan today 01-19-2020.  She had no reaction after injection today. It wasn't documented on 01-07-2020.    Medications Reviewed Today    Reviewed by De Hollingshead, RPH-CPP (Pharmacist) on 09/14/20 at 12  Med List Status: <None>  Medication Order Taking? Sig Documenting Provider Last Dose Status Informant  acetaminophen (TYLENOL) 325 MG tablet [id number]  Take 650 mg by mouth every 6 (six) hours as needed. PRN headache [provider]  Active Self  albuterol (VENTOLIN HFA) 108 (90 Base) MCG/ACT inhaler [id number] No Inhale 1-2 puffs into the lungs every 6 (six) hours as needed for wheezing or shortness of breath.  Patient not taking: Reported on 09/14/2020   Crecencio Mc, MD Not Taking  Active   aspirin 81 MG EC tablet [id number] Yes Take 81 mg by mouth daily. [provider] Taking Active Self  Budeson-Glycopyrrol-Formoterol (BREZTRI AEROSPHERE) 160-9-4.8 MCG/ACT AERO [id number] Yes Inhale 2 puffs into the lungs in the morning and at bedtime. [provider] Taking Active   Cholecalciferol (VITAMIN D3) 125 MCG (5000 UT) CAPS [id number] Yes Take 1,000 Units by mouth  daily.  [provider] Taking Active Self  insulin aspart (NOVOLOG) 100 UNIT/ML injection [id number] Yes Inject 6 Units into the skin 3 (three) times daily before meals. Sherlene Shams, MD Taking Active            Med Note [name], [name]   Wed Aug 01, 2020  2:17 PM) 10-14 units before meals  insulin degludec (TRESIBA FLEXTOUCH) 100 UNIT/ML FlexTouch Pen [id number] Yes Inject 0.3 mLs (30 Units total) into the skin daily. Sherlene Shams, MD Taking Active            Med Note [name] May 15, 2020  3:47 PM)    ipratropium-albuterol (DUONEB) 0.5-2.5 (3) MG/3ML SOLN [id number] Yes USE 1 VIAL VIA NEBULIZER EVERY 6 HOURS AS NEEDED Sherlene Shams, MD Taking Active   Lactobacillus (PROBIOTIC ACIDOPHILUS PO) [id number] Yes Take 1 capsule by mouth daily. [provider] Taking Active Self  lactulose (CHRONULAC) 10 GM/15ML solution [id number] Yes 30 ML EVERY 4 HOURS UNTIL CONSTIPATION IS RELIEVED Sherlene Shams, MD Taking Active   lisinopril (ZESTRIL) 20 MG tablet [id number] Yes Take 1 tablet (20 mg total) by mouth daily. Sherlene Shams, MD Taking Active   magnesium oxide (MAG-OX) 400 MG tablet [id number] Yes Take 400 mg by mouth every other day. [provider] Taking Active Self  Melatonin 5 MG TABS [id number] Yes Take 5 mg by mouth at bedtime. [provider] Taking Active Self  metFORMIN (GLUCOPHAGE) 500 MG tablet [id number] Yes TAKE 1 TABLET (500 MG TOTAL) BY MOUTH 2 (TWO) TIMES DAILY WITH A MEAL. Sherlene Shams, MD Taking Active            Med Note [name], [name]   Fri Sep 14, 2020  2:13 PM) 500 mg QAM, 1000 mg QPM  metoprolol succinate (TOPROL-XL) 25 MG 24 hr tablet [id number] Yes Take 1 tablet (25 mg total) by mouth daily. Sherlene Shams, MD Taking Active   ondansetron (ZOFRAN ODT) 4 MG disintegrating tablet [id number] Yes Take 1 tablet (4 mg total) by mouth every 8 (eight) hours as needed for nausea or vomiting. Sherlene Shams, MD Taking  Active Self  PARoxetine (PAXIL) 10 MG tablet [id number] Yes TAKE 1 TABLET(10 MG) BY MOUTH EVERY MORNING Sherlene Shams, MD Taking Active   polyethylene glycol (MIRALAX / GLYCOLAX) 17 g packet [id number] Yes Take 17 g by mouth 2 (two) times daily. Standley Brooking, MD Taking Active Self  Potassium 99 MG TABS [id number] Yes Take 99 mg by mouth daily. Reported on 09/20/2015 [provider] Taking Active Self           Med Note (CAULFIELD, ASHLEY L   Thu Dec 06, 2015 10:41 AM)    promethazine (PHENERGAN) 12.5 MG tablet [id number] Yes TAKE 1 TABLET (12.5 MG TOTAL) BY MOUTH AT BEDTIME AS NEEDED FOR NAUSEA OR VOMITING. Sherlene Shams, MD Taking Active Self           Med Note [name] Apr 24, 2020  3:54 PM)    rosuvastatin (CRESTOR)  40 MG tablet [id number] Yes TAKE 1 TABLET BY MOUTH EVERY DAY Sherlene Shams, MD Taking Active Self  Semaglutide, 1 MG/DOSE, (OZEMPIC, 1 MG/DOSE,) 4 MG/3ML SOPN [id number] Yes Inject 1 mg into the skin once a week. Sherlene Shams, MD Taking Active           Patient Active Problem List   Diagnosis Date Noted  . B12 deficiency 06/30/2020  . Heart palpitations 01/19/2020  . Palpitations 01/19/2020  . Obstipation 01/14/2020  . Hospital discharge follow-up 01/14/2020  . Colitis 01/07/2020  . Abdominal pain 01/07/2020  . Educated about COVID-19 virus infection 02/20/2019  . Microalbuminuria due to type 2 diabetes mellitus (HCC) 03/25/2018  . Bilateral chronic knee pain 12/20/2016  . Nausea in adult 12/20/2016  . Other specified nonscarring hair loss 12/20/2016  . Vitamin D deficiency 04/22/2016  . Tachycardia 11/21/2014  . Encounter for preventive health examination 09/26/2014  . Neck pain of over 3 months duration 09/26/2014  . Inflammatory arthritis 06/13/2014  . Elevated alkaline phosphatase measurement 10/09/2013  . Leg swelling 04/05/2013  . OSA (obstructive sleep apnea) 03/01/2013  . Allergic rhinitis 02/04/2013  . Pulmonary nodule  09/15/2012  . Chronic hypoxemic respiratory failure (HCC) 05/03/2012  . COPD (chronic obstructive pulmonary disease) (HCC) 04/28/2012  . Diabetes mellitus type 2, uncontrolled, with complications (HCC) 11/07/2011  . GERD (gastroesophageal reflux disease)   . Obesity (BMI 30-39.9)   . Anxiety   . Tobacco abuse, in remission   . Screening for colon cancer   . Screening for breast cancer   . CAD (coronary artery disease) 12/02/2010  . HTN (hypertension) 12/02/2010  . Hyperlipidemia 12/02/2010    Conditions to be addressed/monitored: HTN, HLD, COPD and DM  Patient Care Plan: Medication Management    Problem Identified: Diabetes, COPD, CHF     Long-Range Goal: Disease Progression Prevention   This Visit's Progress: On track  Recent Progress: On track  Priority: High  Note:   Current Barriers:  . Unable to independently afford treatment regimen . Unable to achieve control of diabetes   Pharmacist Clinical Goal(s):  Marland Kitchen Over the next 90 days, patient will verbalize ability to afford treatment regimen. . Over the next 90 days, patient will achieve control of diabetes as evidenced by improvement in A1c through collaboration with PharmD and provider.   Interventions: . 1:1 collaboration with Sherlene Shams, MD regarding development and update of comprehensive plan of care as evidenced by provider attestation and co-signature . Inter-disciplinary care team collaboration (see longitudinal plan of care) . Comprehensive medication review performed; medication list updated in electronic medical record  Health Management: . Reports she tested positive for COVID on 08/23/20. Feeling better now, still struggling with congestion. Notes she continues to use Afrin daily. Reviewed rebound congestion with Afrin, suggested nasal saline for irrigation and/or intranasal steroids for management of congestion, rather than Afrin for any more than 3-5 days. Patient verbalized understanding . Reviewed to  pursue COVID vaccination 90 days after COVID recovery  Diabetes: . Uncontrolled; current treatment: metformin 1000 mg BID- though has been taking 500 mg QAM, 1000 mg BID (wasn't sure if she should take 2 tabs of metformin with Evaristo Bury with Novolog dose), Ozempic 1 mg weekly, Tresiba 30 units daily, Novolog 10-14 units with meals  . Current glucose readings: fasting glucose:  Date of Download: 09/01/20-09/14/20 % Time CGM is active: 73% Average Glucose: 155 mg/dL Glucose Management Indicator: 7.0  Glucose Variability: 23.4 (goal <36%) Time in Goal:  - Time  in range 70-180: 73% - Time above range: 27% - Time below range: 0% Observed patterns: a few episodes of post meal hypoglycemia . Patient notes she is taking Novolog with her meal, sometimes after. Reviewed to try to dose 15-20 minutes before the meal. . Reviewed that there is no harm in taking 1000 mg metformin w/ 2 doses of insulin at the same time given pharmacokinetic parameters. Encouraged to take 2 tab BID. Continue Ozempic 1 mg weekly, Tresiba 30 units daily, and Novolog 10-14 units up to TID with meals based on pre-meal readings. Due for PCP appt and lab work in ~ 2-4 weeks. Will collaborate with office staff to schedule this . Discussed possible addition of SGLT2 in the future for CV benefits, as well as reduced need for prandial insulin. Patient would qualify for patient assistance   Hypertension: . Uncontrolled at last office visit, but lisinopril dose increased since that time; current treatment: lisinopril 20 mg daily, metoprolol succinate 25 mg daily, potassium 99 mg daily; follows w/ Dr. Milta Deiters . Continue current regimen at this time. PCP f/u in the next month  Hyperlipidemia and ASCVD Risk Reduction . Controlled; current treatment: rosuvastatin 40 mg daily   . Antiplatelet regimen: aspirin 81 mg daily  . Recommended to continue current regimen at this tiem  Chronic Obstructive Pulmonary Disease: . Controlled; current  treatment: Breztri 2 puffs BID, albuterol nebulizer ~3 times daily or albuterol HFA if she is out.  Marland Kitchen Approved for Breztri assistance through 09/07/21. Faxing in refill to [employer] today . Continue current regimen at this time  Depression/Anxiety: . Moderately well controlled at this time; current regimen: paroxetine 10 mg daily . Recommend to continue current regimen at this time. If concerns with control moving forward, would consider choosing an option w/ less anticholinergic activity. She does endorse dry mouth at this time, but could be related to anticongestants  Constipation: . Managed on current regimen. Lactulose ~Q3 days, Miralax PRN . Encouraged to continue current regimen with hydration.   Patient Goals/Self-Care Activities . Over the next 90 days, patient will:  - take medications as prescribed check blood glucose using CGM TID, document, and provide at future appointments check blood pressure weekly, document, and provide at future appointments collaborate with provider on medication access solutions  Follow Up Plan: Telephone follow up appointment with care management team member scheduled for:~ 6 weeks       Medication Assistance: Breztri obtained through [employer] medication assistance program through 09/07/21. Novo ([place], Evaristo Bury, [place]) application pending  Plan: Telephone follow up appointment with care management team member scheduled for:~ 6 weeks  [name], PharmD, [place], CPP Veterinary surgeon at [employer] [phone]

## 2020-09-14 NOTE — Patient Instructions (Signed)
Visit Information  Patient Care Plan: Medication Management    Problem Identified: Diabetes, COPD, CHF     Long-Range Goal: Disease Progression Prevention   This Visit's Progress: On track  Recent Progress: On track  Priority: High  Note:   Current Barriers:  . Unable to independently afford treatment regimen . Unable to achieve control of diabetes   Pharmacist Clinical Goal(s):  Marland Kitchen Over the next 90 days, patient will verbalize ability to afford treatment regimen. . Over the next 90 days, patient will achieve control of diabetes as evidenced by improvement in A1c through collaboration with PharmD and provider.   Interventions: . 1:1 collaboration with Sherlene Shams, MD regarding development and update of comprehensive plan of care as evidenced by provider attestation and co-signature . Inter-disciplinary care team collaboration (see longitudinal plan of care) . Comprehensive medication review performed; medication list updated in electronic medical record  Health Management: . Reports she tested positive for COVID on 08/23/20. Feeling better now, still struggling with congestion. Notes she continues to use Afrin daily. Reviewed rebound congestion with Afrin, suggested nasal saline for irrigation and/or intranasal steroids for management of congestion, rather than Afrin for any more than 3-5 days. Patient verbalized understanding . Reviewed to pursue COVID vaccination 90 days after COVID recovery  Diabetes: . Uncontrolled; current treatment: metformin 1000 mg BID- though has been taking 500 mg QAM, 1000 mg BID (wasn't sure if she should take 2 tabs of metformin with Evaristo Bury with Novolog dose), Ozempic 1 mg weekly, Tresiba 30 units daily, Novolog 10-14 units with meals  . Current glucose readings: fasting glucose:  Date of Download: 09/01/20-09/14/20 % Time CGM is active: 73% Average Glucose: 155 mg/dL Glucose Management Indicator: 7.0  Glucose Variability: 23.4 (goal <36%) Time in  Goal:  - Time in range 70-180: 73% - Time above range: 27% - Time below range: 0% Observed patterns: a few episodes of post meal hypoglycemia . Patient notes she is taking Novolog with her meal, sometimes after. Reviewed to try to dose 15-20 minutes before the meal. . Reviewed that there is no harm in taking 1000 mg metformin w/ 2 doses of insulin at the same time given pharmacokinetic parameters. Encouraged to take 2 tab BID. Continue Ozempic 1 mg weekly, Tresiba 30 units daily, and Novolog 10-14 units up to TID with meals based on pre-meal readings. Due for PCP appt and lab work in ~ 2-4 weeks. Will collaborate with office staff to schedule this . Discussed possible addition of SGLT2 in the future for CV benefits, as well as reduced need for prandial insulin. Patient would qualify for patient assistance   Hypertension: . Uncontrolled at last office visit, but lisinopril dose increased since that time; current treatment: lisinopril 20 mg daily, metoprolol succinate 25 mg daily, potassium 99 mg daily; follows w/ Dr. Milta Deiters . Continue current regimen at this time. PCP f/u in the next month  Hyperlipidemia and ASCVD Risk Reduction . Controlled; current treatment: rosuvastatin 40 mg daily   . Antiplatelet regimen: aspirin 81 mg daily  . Recommended to continue current regimen at this tiem  Chronic Obstructive Pulmonary Disease: . Controlled; current treatment: Breztri 2 puffs BID, albuterol nebulizer ~3 times daily or albuterol HFA if she is out.  Marland Kitchen Approved for Breztri assistance through 09/07/21. Faxing in refill to [employer] today . Continue current regimen at this time  Depression/Anxiety: . Moderately well controlled at this time; current regimen: paroxetine 10 mg daily . Recommend to continue current regimen at  this time. If concerns with control moving forward, would consider choosing an option w/ less anticholinergic activity. She does endorse dry mouth at this time, but could be  related to anticongestants  Constipation: . Managed on current regimen. Lactulose ~Q3 days, Miralax PRN . Encouraged to continue current regimen with hydration.   Patient Goals/Self-Care Activities . Over the next 90 days, patient will:  - take medications as prescribed check blood glucose using CGM TID, document, and provide at future appointments check blood pressure weekly, document, and provide at future appointments collaborate with provider on medication access solutions  Follow Up Plan: Telephone follow up appointment with care management team member scheduled for:~ 6 weeks       The patient verbalized understanding of instructions, educational materials, and care plan provided today and declined offer to receive copy of patient instructions, educational materials, and care plan.   Plan: Telephone follow up appointment with care management team member scheduled for:~ 6 weeks  [name], PharmD, [place], CPP Clinical Pharmacist [facility] at [employer] [employer] [phone]

## 2020-09-21 DIAGNOSIS — E1165 Type 2 diabetes mellitus with hyperglycemia: Secondary | ICD-10-CM | POA: Diagnosis not present

## 2020-09-25 DIAGNOSIS — G839 Paralytic syndrome, unspecified: Secondary | ICD-10-CM | POA: Diagnosis not present

## 2020-09-25 DIAGNOSIS — J449 Chronic obstructive pulmonary disease, unspecified: Secondary | ICD-10-CM | POA: Diagnosis not present

## 2020-09-30 ENCOUNTER — Other Ambulatory Visit: Payer: Self-pay | Admitting: Internal Medicine

## 2020-10-01 DIAGNOSIS — I25119 Atherosclerotic heart disease of native coronary artery with unspecified angina pectoris: Secondary | ICD-10-CM | POA: Diagnosis not present

## 2020-10-01 DIAGNOSIS — J9610 Chronic respiratory failure, unspecified whether with hypoxia or hypercapnia: Secondary | ICD-10-CM | POA: Diagnosis not present

## 2020-10-01 DIAGNOSIS — G4700 Insomnia, unspecified: Secondary | ICD-10-CM | POA: Diagnosis not present

## 2020-10-01 DIAGNOSIS — E1165 Type 2 diabetes mellitus with hyperglycemia: Secondary | ICD-10-CM | POA: Diagnosis not present

## 2020-10-01 DIAGNOSIS — E785 Hyperlipidemia, unspecified: Secondary | ICD-10-CM | POA: Diagnosis not present

## 2020-10-01 DIAGNOSIS — E876 Hypokalemia: Secondary | ICD-10-CM | POA: Diagnosis not present

## 2020-10-01 DIAGNOSIS — R69 Illness, unspecified: Secondary | ICD-10-CM | POA: Diagnosis not present

## 2020-10-01 DIAGNOSIS — Z794 Long term (current) use of insulin: Secondary | ICD-10-CM | POA: Diagnosis not present

## 2020-10-01 DIAGNOSIS — J439 Emphysema, unspecified: Secondary | ICD-10-CM | POA: Diagnosis not present

## 2020-10-03 ENCOUNTER — Telehealth: Payer: Self-pay

## 2020-10-03 ENCOUNTER — Telehealth: Payer: Self-pay | Admitting: Pharmacist

## 2020-10-03 ENCOUNTER — Other Ambulatory Visit: Payer: Self-pay | Admitting: Internal Medicine

## 2020-10-03 NOTE — Telephone Encounter (Signed)
Patient called to note that she is out of [place] and has 1 pen of Novolog remaining.   Discussed w/ CPhT, she called Novo - per their automated line, the refills are processing.   Contacted patient, she denies being able to afford to fill a 1 month supply of [place].    [name] - can we pull a sample of 1 pen of Tresiba and 1 of Novolog for her?

## 2020-10-03 NOTE — Telephone Encounter (Signed)
Done

## 2020-10-03 NOTE — Telephone Encounter (Signed)
Thanks! I will talk to [employer] rep about Novolog samples as well. Just label Catherine [name] for patient at this time

## 2020-10-03 NOTE — Telephone Encounter (Signed)
I have pulled the tresiba but we do not have any novolog. We have Basaglar and Humalog.

## 2020-10-03 NOTE — Telephone Encounter (Signed)
Samples of Evaristo Bury were given to the patient, quantity 1, Lot Number [id number], Expiration: 01/2022.   Pt is aware that medication is ready to be picked up.

## 2020-10-16 ENCOUNTER — Telehealth: Payer: Self-pay

## 2020-10-16 NOTE — Telephone Encounter (Signed)
Medicine has been stored in the sample fridge for patient pick up.

## 2020-10-16 NOTE — Telephone Encounter (Signed)
Received 2 boxes of [place] for [place]. Called and spoke to [place] and she stated she will let her son come and pick up the medicine as she currently feels sick.

## 2020-10-22 ENCOUNTER — Telehealth: Payer: Self-pay | Admitting: Internal Medicine

## 2020-10-22 DIAGNOSIS — E1165 Type 2 diabetes mellitus with hyperglycemia: Secondary | ICD-10-CM | POA: Diagnosis not present

## 2020-10-22 DIAGNOSIS — J432 Centrilobular emphysema: Secondary | ICD-10-CM

## 2020-10-22 NOTE — Addendum Note (Signed)
Addended by: Dennie Bible on: 10/22/2020 12:25 PM   Modules accepted: Orders

## 2020-10-22 NOTE — Telephone Encounter (Signed)
Pt needs [place] to call [employer] to get her a new oxygen concentrator hers is going out ever 30 minutes

## 2020-10-22 NOTE — Telephone Encounter (Signed)
Spoke with Catherine [name] the [employer] he will have a Representative call , to assist patient with New concentrator and if any assistance or information is needed from office Catherine [name] will notify us. Advised patient she has an Account with [employer] and they are furnishing her with 02 that she has any problems she should call them immediately in the future even on weekends they have assistance.

## 2020-10-22 NOTE — Telephone Encounter (Signed)
Order forms have been faxed.

## 2020-10-23 ENCOUNTER — Other Ambulatory Visit: Payer: Self-pay | Admitting: Internal Medicine

## 2020-10-24 ENCOUNTER — Ambulatory Visit (INDEPENDENT_AMBULATORY_CARE_PROVIDER_SITE_OTHER): Payer: Medicare HMO | Admitting: Pharmacist

## 2020-10-24 DIAGNOSIS — J432 Centrilobular emphysema: Secondary | ICD-10-CM | POA: Diagnosis not present

## 2020-10-24 DIAGNOSIS — IMO0002 Reserved for concepts with insufficient information to code with codable children: Secondary | ICD-10-CM

## 2020-10-24 DIAGNOSIS — I2510 Atherosclerotic heart disease of native coronary artery without angina pectoris: Secondary | ICD-10-CM

## 2020-10-24 DIAGNOSIS — E1165 Type 2 diabetes mellitus with hyperglycemia: Secondary | ICD-10-CM | POA: Diagnosis not present

## 2020-10-24 DIAGNOSIS — E118 Type 2 diabetes mellitus with unspecified complications: Secondary | ICD-10-CM | POA: Diagnosis not present

## 2020-10-24 DIAGNOSIS — E782 Mixed hyperlipidemia: Secondary | ICD-10-CM

## 2020-10-24 DIAGNOSIS — I10 Essential (primary) hypertension: Secondary | ICD-10-CM

## 2020-10-24 MED ORDER — INSULIN ASPART 100 UNIT/ML ~~LOC~~ SOLN: SUBCUTANEOUS | 0 refills | Status: DC

## 2020-10-24 NOTE — Chronic Care Management (AMB) (Signed)
Chronic Care Management Pharmacy Note  10/24/2020 Name:  Catherine [name] MRN:  [id number] DOB:  December 22, 1955  Subjective: Catherine [name] is an 65 y.o. year old female who is a primary patient of Darrick Huntsman, [name], MD.  The CCM team was consulted for assistance with disease management and care coordination needs.    Engaged with patient by telephone for follow up visit in response to provider referral for pharmacy case management and/or care coordination services.   Consent to Services:  The patient was given information about Chronic Care Management services, agreed to services, and gave verbal consent prior to initiation of services.  Please see initial visit note for detailed documentation.   Objective:  Lab Results  Component Value Date   CREATININE 0.78 06/26/2020   CREATININE 0.72 01/19/2020   CREATININE 0.63 01/16/2020    Lab Results  Component Value Date   HGBA1C 7.2 (A) 06/26/2020       Component Value Date/Time   CHOL 141 06/26/2020 1441   CHOL 185 11/11/2011 0914   TRIG 211.0 (H) 06/26/2020 1441   TRIG 166 11/11/2011 0914   HDL 39.10 06/26/2020 1441   HDL 47 11/11/2011 0914   CHOLHDL 4 06/26/2020 1441   VLDL 42.2 (H) 06/26/2020 1441   VLDL 33 11/11/2011 0914   LDLCALC 73 12/10/2018 1447   LDLCALC 105 (H) 11/11/2011 0914   LDLDIRECT 74.0 06/26/2020 1441    BP Readings from Last 3 Encounters:  08/15/20 (!) 154/78  06/26/20 (!) 164/84  01/19/20 (!) 142/95    Assessment: Review of patient past medical history, allergies, medications, health status, including review of consultants reports, laboratory and other test data, was performed as part of comprehensive evaluation and provision of chronic care management services.   SDOH:  (Social Determinants of Health) assessments and interventions performed:  SDOH Interventions   Flowsheet Row Most Recent Value  SDOH Interventions   Financial Strain Interventions Other (Comment)  [manufacturer assistance]       CCM Care Plan  Allergies  Allergen Reactions  . Sulfa Antibiotics Swelling    Patient reports caused cellulitis Other reaction(s): Other (See Comments) cellulitis  . Hydroxychloroquine     Other reaction(s): Other (See Comments) Gi upset  . Oxycodone-Acetaminophen   . Percocet [Oxycodone-Acetaminophen]   . Vicodin [Hydrocodone-Acetaminophen]   . Aspirin Other (See Comments)    Other reaction(s): Blood Disorder  . Ibuprofen     Other reaction(s): Blood Disorder  . Omnipaque [Iohexol] Itching    Patient states she has had IV contrast multiple times without any reaction. But she said on Jan 07, 2020 after injection she had terrible itching.  They gave her Benadryl which stopped it immediately.  Told Dr. Don Perking this and she elected to give 50 mg IV Benadryl before scan today 01-19-2020.  She had no reaction after injection today. It wasn't documented on 01-07-2020.    Medications Reviewed Today    Reviewed by Lourena Simmonds, RPH-CPP (Pharmacist) on 10/24/20 at 1502  Med List Status: <None>  Medication Order Taking? Sig Documenting Provider Last Dose Status Informant  acetaminophen (TYLENOL) 325 MG tablet [id number]  Take 650 mg by mouth every 6 (six) hours as needed. PRN headache [provider]  Active Self  albuterol (VENTOLIN HFA) 108 (90 Base) MCG/ACT inhaler [id number]  Inhale 1-2 puffs into the lungs every 6 (six) hours as needed for wheezing or shortness of breath. Sherlene Shams, MD  Active   aspirin 81 MG EC tablet [id number]  Take 81  mg by mouth daily. [provider]  Active Self  Budeson-Glycopyrrol-Formoterol (BREZTRI AEROSPHERE) 160-9-4.8 MCG/ACT AERO [id number]  Inhale 2 puffs into the lungs 2 (two) times daily. Sherlene Shamsullo, Teresa L, MD  Active   Cholecalciferol (VITAMIN D3) 125 MCG (5000 UT) CAPS [id number]  Take 1,000 Units by mouth daily.  [provider]  Active Self  insulin aspart (NOVOLOG) 100 UNIT/ML injection [id number] Yes Inject 6 Units  into the skin 3 (three) times daily before meals. Sherlene Shamsullo, Teresa L, MD Taking Active            Med Note [name](TRAVIS, Arie SabinaCATHERINE E   Wed Aug 01, 2020  2:17 PM) 10-14 units before meals  insulin degludec (TRESIBA FLEXTOUCH) 100 UNIT/ML FlexTouch Pen [id number] Yes Inject 0.3 mLs (30 Units total) into the skin daily. Sherlene Shamsullo, Teresa L, MD Taking Active            Med Note [name](TRAVIS, CATHERINE E   Tue May 15, 2020  3:47 PM)    ipratropium-albuterol (DUONEB) 0.5-2.5 (3) MG/3ML SOLN [id number]  USE 1 VIAL VIA NEBULIZER EVERY 6 HOURS AS NEEDED Sherlene Shamsullo, Teresa L, MD  Active   Lactobacillus (PROBIOTIC ACIDOPHILUS PO) [id number]  Take 1 capsule by mouth daily. [provider]  Active Self  lactulose (CHRONULAC) 10 GM/15ML solution [id number]  30 ML EVERY 4 HOURS UNTIL CONSTIPATION IS RELIEVED Sherlene Shamsullo, Teresa L, MD  Active   lisinopril (ZESTRIL) 20 MG tablet [id number]  Take 1 tablet (20 mg total) by mouth daily. Sherlene Shamsullo, Teresa L, MD  Active   magnesium oxide (MAG-OX) 400 MG tablet [id number]  Take 400 mg by mouth every other day. [provider]  Active Self  Melatonin 5 MG TABS [id number]  Take 5 mg by mouth at bedtime. [provider]  Active Self  metFORMIN (GLUCOPHAGE) 500 MG tablet [id number] Yes TAKE 1 TABLET (500 MG TOTAL) BY MOUTH 2 (TWO) TIMES DAILY WITH A MEAL. Sherlene Shamsullo, Teresa L, MD Taking Active            Med Note [name](TRAVIS, Arie SabinaCATHERINE E   Fri Sep 14, 2020  2:13 PM) 500 mg QAM, 1000 mg QPM  metoprolol succinate (TOPROL-XL) 25 MG 24 hr tablet [id number]  Take 1 tablet (25 mg total) by mouth daily. Sherlene Shamsullo, Teresa L, MD  Active   ondansetron (ZOFRAN ODT) 4 MG disintegrating tablet [id number]  Take 1 tablet (4 mg total) by mouth every 8 (eight) hours as needed for nausea or vomiting. Sherlene Shamsullo, Teresa L, MD  Active Self  PARoxetine (PAXIL) 10 MG tablet [id number]  TAKE 1 TABLET(10 MG) BY MOUTH EVERY MORNING Sherlene Shamsullo, Teresa L, MD  Active   polyethylene glycol (MIRALAX / GLYCOLAX) 17 g packet [id number]  Take 17 g  by mouth 2 (two) times daily. Standley BrookingGoodrich, Daniel P, MD  Active Self  Potassium 99 MG TABS [id number]  Take 99 mg by mouth daily. Reported on 09/20/2015 [provider]  Active Self           Med Note (CAULFIELD, ASHLEY L   Thu Dec 06, 2015 10:41 AM)    promethazine (PHENERGAN) 12.5 MG tablet [id number]  TAKE 1 TABLET (12.5 MG TOTAL) BY MOUTH AT BEDTIME AS NEEDED FOR NAUSEA OR VOMITING. Sherlene Shamsullo, Teresa L, MD  Active   rosuvastatin (CRESTOR) 40 MG tablet [id number]  TAKE 1 TABLET BY MOUTH EVERY DAY Sherlene Shamsullo, Teresa L, MD  Active   Semaglutide, 1 MG/DOSE, (OZEMPIC, 1 MG/DOSE,) 4 MG/3ML SOPN [id number] Yes Inject 1 mg into the skin once a  week. Sherlene Shams, MD Taking Active           Patient Active Problem List   Diagnosis Date Noted  . B12 deficiency 06/30/2020  . Heart palpitations 01/19/2020  . Palpitations 01/19/2020  . Obstipation 01/14/2020  . Hospital discharge follow-up 01/14/2020  . Colitis 01/07/2020  . Abdominal pain 01/07/2020  . Educated about COVID-19 virus infection 02/20/2019  . Microalbuminuria due to type 2 diabetes mellitus (HCC) 03/25/2018  . Bilateral chronic knee pain 12/20/2016  . Nausea in adult 12/20/2016  . Other specified nonscarring hair loss 12/20/2016  . Vitamin D deficiency 04/22/2016  . Tachycardia 11/21/2014  . Encounter for preventive health examination 09/26/2014  . Neck pain of over 3 months duration 09/26/2014  . Inflammatory arthritis 06/13/2014  . Elevated alkaline phosphatase measurement 10/09/2013  . Leg swelling 04/05/2013  . OSA (obstructive sleep apnea) 03/01/2013  . Allergic rhinitis 02/04/2013  . Pulmonary nodule 09/15/2012  . Chronic hypoxemic respiratory failure (HCC) 05/03/2012  . COPD (chronic obstructive pulmonary disease) (HCC) 04/28/2012  . Diabetes mellitus type 2, uncontrolled, with complications (HCC) 11/07/2011  . GERD (gastroesophageal reflux disease)   . Obesity (BMI 30-39.9)   . Anxiety   . Tobacco abuse, in remission    . Screening for colon cancer   . Screening for breast cancer   . CAD (coronary artery disease) 12/02/2010  . HTN (hypertension) 12/02/2010  . Hyperlipidemia 12/02/2010    Conditions to be addressed/monitored: CHF, HTN, HLD, COPD and DMII  Care Plan : Medication Management  Updates made by Lourena Simmonds, RPH-CPP since 10/24/2020 12:00 AM    Problem: Diabetes, COPD, CHF     Long-Range Goal: Disease Progression Prevention   This Visit's Progress: On track  Recent Progress: On track  Priority: High  Note:   Current Barriers:  . Unable to independently afford treatment regimen . Unable to achieve control of diabetes   Pharmacist Clinical Goal(s):  Marland Kitchen Over the next 90 days, patient will verbalize ability to afford treatment regimen. . Over the next 90 days, patient will achieve control of diabetes as evidenced by improvement in A1c through collaboration with PharmD and provider.   Interventions: . 1:1 collaboration with Sherlene Shams, MD regarding development and update of comprehensive plan of care as evidenced by provider attestation and co-signature . Inter-disciplinary care team collaboration (see longitudinal plan of care) . Comprehensive medication review performed; medication list updated in electronic medical record  Health Maintenance: . Overdue for follow up with PCP. Will collaborate w/ office staff to outreach patient to schedule f/u.  Diabetes: . Uncontrolled; current treatment: metformin 1000 mg BID- though has been taking 500 mg QAM, 1000 mg BID, Ozempic 1 mg weekly, Tresiba 30 units daily, Novolog 10-14 units with meals - reports that she ran out of Novolog (did not notify me of this). Purchased OTC Novolin R and has been taking at mealtimes . Current glucose readings: fasting glucose:  Date of Download: 10/10/20-10/23/2020 % Time CGM is active: 89% Average Glucose: 154 mg/dL Glucose Management Indicator: 7.0  Glucose Variability: 23.1 (goal <36%) Time in  Goal:  - Time in range 70-180: 77% - Time above range: 23% - Time below range: 0% Observed patterns: occasional post prandial hypoglycemia . Approved for patient assistance from [employer] through 09/07/21.  Marland Kitchen Discussed options of maximizing metformin dose vs addition of SGLT2. Patient would prefer to increase metformin at this time. Increase metformin to 1000 mg BID using 500 mg tablets. Follow for diarrhea/GI  upset. Can change to extended release metformin if IR at maximum dose is not tolerated.  . Discussed CV and renal benefits of SGLT2. Continue to follow for addition of therapy. Patient would qualify for patient assistance.  . Reviewed documentation. 2021 script for Novolog was for 6 units TID, which is what her supply that was shipped and picked up on 08/21/20 was for. Per that, patient is not due to have her refill released until 10/27/20, which will still take 10-14 business days to ship. Appears [facility] did not process the increased dose order that was faxed with her 2022 reapplication (max of 42 units Novolog daily). [facility] [employer]. They provided me with voucher information for a free 30 day supply of Novolog to be covered at her local pharmacy. Called this information to the pharmacy [facility]: [phone], PCN: [id number], Group: [id number], ID: [id number]). Pharmacy confirmed that this ran for a $0 copay.  Berneda Rose Tresiba 30 units daily, Ozempic 1 mg weekly.   Hypertension: . Uncontrolled at last office visit, but lisinopril dose increased since that time; current treatment: lisinopril 20 mg daily, metoprolol succinate 25 mg daily, potassium 99 mg daily; follows w/ Dr. Milta Deiters . Has not been checking home BP readings at this time . Continue current regimen at this time. PCP f/u due in the next several weeks.   Hyperlipidemia and ASCVD Risk Reduction . Controlled per last lipid panel; current treatment: rosuvastatin 40 mg daily   . Antiplatelet regimen: aspirin 81 mg daily   . Recommended to continue current regimen at this time  Chronic Obstructive Pulmonary Disease: . Controlled; current treatment: Breztri 2 puffs BID, albuterol nebulizer ~3 times daily or albuterol HFA if she is out.  Marland Kitchen Approved for Breztri assistance through 09/07/21.  Marland Kitchen Continue current regimen at this time.  Depression/Anxiety: . Moderately well controlled at this time; current regimen: paroxetine 10 mg daily . Recommend to continue current regimen at this time. Monitor for any increased risk of anticholinergic side effects.   Constipation: . Managed on current regimen. Lactulose ~Q3 days, Miralax PRN . Encouraged to continue current regimen with hydration.   Patient Goals/Self-Care Activities . Over the next 90 days, patient will:  - take medications as prescribed check blood glucose using CGM TID, document, and provide at future appointments check blood pressure weekly, document, and provide at future appointments collaborate with provider on medication access solutions  Follow Up Plan: Telephone follow up appointment with care management team member scheduled for:~ 6 weeks       Medication Assistance: Kathalene Frames, Novolog obtained through [employer] medication assistance program.  Enrollment ends 08/07/21  Breztri obtained through [employer] medication assistance program.  Enrollment ends 09/07/21  Follow Up:  Patient agrees to Care Plan and Follow-up.  Plan: Telephone follow up appointment with care management team member scheduled for:  ~ 6 weeks  [name], PharmD, [place], CPP Clinical Pharmacist [facility] [employer] [facility] [phone]

## 2020-10-24 NOTE — Telephone Encounter (Signed)
Last OV 10/21 last fill Jan 2021 ok to fill?

## 2020-10-24 NOTE — Patient Instructions (Signed)
Visit Information  PATIENT GOALS: Goals Addressed              This Visit's Progress     Patient Stated   .  Medication Management (pt-stated)         Patient Goals/Self-Care Activities . Over the next 90 days, patient will:  - take medications as prescribed check blood glucose using CGM TID, document, and provide at future appointments check blood pressure weekly, document, and provide at future appointments collaborate with provider on medication access solutions        Patient verbalizes understanding of instructions provided today and agrees to view in MyChart.    Plan: Telephone follow up appointment with care management team member scheduled for:  ~ 6 weeks  [name], PharmD, [place], CPP Clinical Pharmacist [facility] [employer] [phone]

## 2020-10-26 DIAGNOSIS — G839 Paralytic syndrome, unspecified: Secondary | ICD-10-CM | POA: Diagnosis not present

## 2020-10-26 DIAGNOSIS — J449 Chronic obstructive pulmonary disease, unspecified: Secondary | ICD-10-CM | POA: Diagnosis not present

## 2020-11-09 ENCOUNTER — Ambulatory Visit: Payer: Medicare HMO | Admitting: Internal Medicine

## 2020-11-14 ENCOUNTER — Other Ambulatory Visit: Payer: Self-pay | Admitting: Internal Medicine

## 2020-11-14 DIAGNOSIS — J9611 Chronic respiratory failure with hypoxia: Secondary | ICD-10-CM

## 2020-11-14 DIAGNOSIS — J432 Centrilobular emphysema: Secondary | ICD-10-CM

## 2020-11-19 DIAGNOSIS — E1165 Type 2 diabetes mellitus with hyperglycemia: Secondary | ICD-10-CM | POA: Diagnosis not present

## 2020-11-20 ENCOUNTER — Other Ambulatory Visit: Payer: Self-pay | Admitting: Internal Medicine

## 2020-11-20 ENCOUNTER — Ambulatory Visit: Payer: Medicare HMO | Admitting: Pharmacist

## 2020-11-20 DIAGNOSIS — I10 Essential (primary) hypertension: Secondary | ICD-10-CM

## 2020-11-20 DIAGNOSIS — I2510 Atherosclerotic heart disease of native coronary artery without angina pectoris: Secondary | ICD-10-CM

## 2020-11-20 DIAGNOSIS — J432 Centrilobular emphysema: Secondary | ICD-10-CM

## 2020-11-20 DIAGNOSIS — E1165 Type 2 diabetes mellitus with hyperglycemia: Secondary | ICD-10-CM

## 2020-11-20 DIAGNOSIS — E782 Mixed hyperlipidemia: Secondary | ICD-10-CM

## 2020-11-20 DIAGNOSIS — IMO0002 Reserved for concepts with insufficient information to code with codable children: Secondary | ICD-10-CM

## 2020-11-20 NOTE — Chronic Care Management (AMB) (Signed)
Chronic Care Management Pharmacy Note  11/20/2020 Name:  Catherine [name] MRN:  [id number] DOB:  1956/07/11  Subjective: Catherine [name] is an 65 y.o. year old female who is a primary patient of Derrel Nip, Aris Everts, MD.  The CCM team was consulted for assistance with disease management and care coordination needs.    Care coordination for medication access in response to provider referral for pharmacy case management and/or care coordination services.   Consent to Services:  The patient was given information about Chronic Care Management services, agreed to services, and gave verbal consent prior to initiation of services.  Please see initial visit note for detailed documentation.   Patient Care Team: Crecencio Mc, MD as PCP - General (Internal Medicine) De Hollingshead, RPH-CPP as Pharmacist (Pharmacist)  Recent office visits: None since our last call  Recent consult visits: None since our last call  Hospital visits: None in previous 6 months  Objective:  Lab Results  Component Value Date   CREATININE 0.78 06/26/2020   BUN 11 06/26/2020   GFR 79.26 06/26/2020   GFRNONAA >60 01/19/2020   GFRAA >60 01/19/2020   NA 140 06/26/2020   K 4.5 06/26/2020   CALCIUM 9.4 06/26/2020   CO2 33 (H) 06/26/2020    Lab Results  Component Value Date/Time   HGBA1C 7.2 (A) 06/26/2020 02:20 PM   HGBA1C 7.9 (H) 01/19/2020 03:47 AM   HGBA1C 8.2 (H) 01/08/2020 06:32 AM   HGBA1C 7.1 (H) 08/29/2012 04:52 AM   GFR 79.26 06/26/2020 02:41 PM   GFR 81.18 01/12/2020 11:36 AM   MICROALBUR 4.5 (H) 03/23/2018 04:40 PM   MICROALBUR <0.2 08/08/2016 04:44 PM    Last diabetic Eye exam:  Lab Results  Component Value Date/Time   HMDIABEYEEXA No Retinopathy 01/30/2016 08:09 AM    Last diabetic Foot exam:  Lab Results  Component Value Date/Time   HMDIABFOOTEX normal 10/07/2013 12:00 AM     Lab Results  Component Value Date   CHOL 141 06/26/2020   HDL 39.10 06/26/2020   LDLCALC 73  12/10/2018   LDLDIRECT 74.0 06/26/2020   TRIG 211.0 (H) 06/26/2020   CHOLHDL 4 06/26/2020    Hepatic Function Latest Ref Rng & Units 06/26/2020 01/12/2020 01/07/2020  Total Protein 6.0 - 8.3 g/dL 6.9 6.7 7.6  Albumin 3.5 - 5.2 g/dL 4.0 3.7 3.8  AST 0 - 37 U/L 35 36 39  ALT 0 - 35 U/L '19 25 28  ' Alk Phosphatase 39 - 117 U/L 95 95 96  Total Bilirubin 0.2 - 1.2 mg/dL 0.3 0.4 0.8  Bilirubin, Direct 0.0 - 0.3 mg/dL - - -    Lab Results  Component Value Date/Time   TSH 1.10 01/12/2020 11:36 AM   TSH 1.05 12/10/2018 02:47 PM    CBC Latest Ref Rng & Units 01/19/2020 01/16/2020 01/08/2020  WBC 4.0 - 10.5 K/uL 10.3 9.9 12.8(H)  Hemoglobin 12.0 - 15.0 g/dL 13.6 13.5 12.2  Hematocrit 36.0 - 46.0 % 41.8 40.7 38.0  Platelets 150 - 400 K/uL 293 316 267    Lab Results  Component Value Date/Time   VD25OH 37 12/10/2018 02:47 PM   VD25OH 31.92 03/23/2018 04:40 PM   VD25OH 31 08/08/2016 04:44 PM   VD25OH 3.48 (L) 04/21/2016 10:20 AM    Clinical ASCVD: No  The 10-year ASCVD risk score Mikey Bussing DC Jr., et al., 2013) is: 20.8%   Values used to calculate the score:     Age: 64 years     Sex: Female  Is Non-Hispanic African American: No     Diabetic: Yes     Tobacco smoker: No     Systolic Blood Pressure: 286 mmHg     Is BP treated: Yes     HDL Cholesterol: 39.1 mg/dL     Total Cholesterol: 141 mg/dL    Depression screen [facility] 2/9 02/21/2020 09/14/2019 02/18/2019  Decreased Interest 0 0 0  Down, Depressed, Hopeless 0 0 0  PHQ - 2 Score 0 0 0  Some recent data might be hidden       Social History   Tobacco Use  Smoking Status Former Smoker   Packs/day: 1.00   Years: 40.00   Pack years: 40.00   Types: Cigarettes   Quit date: 06/27/2011   Years since quitting: 9.4  Smokeless Tobacco Never Used   BP Readings from Last 3 Encounters:  08/15/20 (!) 154/78  06/26/20 (!) 164/84  01/19/20 (!) 142/95   Pulse Readings from Last 3 Encounters:  08/15/20 91  06/26/20 (!) 120  01/19/20 82    Wt Readings from Last 3 Encounters:  06/26/20 264 lb 6.4 oz (119.9 kg)  04/05/20 (!) 268 lb (121.6 kg)  02/21/20 275 lb (124.7 kg)    Assessment/Interventions: Review of patient past medical history, allergies, medications, health status, including review of consultants reports, laboratory and other test data, was performed as part of comprehensive evaluation and provision of chronic care management services.   SDOH:  (Social Determinants of Health) assessments and interventions performed: No   CCM Care Plan  Allergies  Allergen Reactions   Sulfa Antibiotics Swelling    Patient reports caused cellulitis Other reaction(s): Other (See Comments) cellulitis   Hydroxychloroquine     Other reaction(s): Other (See Comments) Gi upset   Oxycodone-Acetaminophen    Percocet [Oxycodone-Acetaminophen]    Vicodin [Hydrocodone-Acetaminophen]    Aspirin Other (See Comments)    Other reaction(s): Blood Disorder   Ibuprofen     Other reaction(s): Blood Disorder   Omnipaque [Iohexol] Itching    Patient states she has had IV contrast multiple times without any reaction. But she said on Jan 07, 2020 after injection she had terrible itching.  They gave her Benadryl which stopped it immediately.  Told Dr. Alfred Levins this and she elected to give 50 mg IV Benadryl before scan today 01-19-2020.  She had no reaction after injection today. It wasn't documented on 01-07-2020.    Medications Reviewed Today    Reviewed by De Hollingshead, RPH-CPP (Pharmacist) on 10/24/20 at 1502  Med List Status: <None>  Medication Order Taking? Sig Documenting Provider Last Dose Status Informant  acetaminophen (TYLENOL) 325 MG tablet [id number]  Take 650 mg by mouth every 6 (six) hours as needed. PRN headache [provider]  Active Self  albuterol (VENTOLIN HFA) 108 (90 Base) MCG/ACT inhaler [id number]  Inhale 1-2 puffs into the lungs every 6 (six) hours as needed for wheezing or shortness of breath. Crecencio Mc, MD  Active   aspirin 81 MG EC tablet [id number]  Take 81 mg by mouth daily. [provider]  Active Self  Budeson-Glycopyrrol-Formoterol (BREZTRI AEROSPHERE) 160-9-4.8 MCG/ACT AERO [id number]  Inhale 2 puffs into the lungs 2 (two) times daily. Crecencio Mc, MD  Active   Cholecalciferol (VITAMIN D3) 125 MCG (5000 UT) CAPS [id number]  Take 1,000 Units by mouth daily.  [provider]  Active Self  insulin aspart (NOVOLOG) 100 UNIT/ML injection [id number] Yes Inject 6 Units into the skin 3 (three) times daily  before meals. Crecencio Mc, MD Taking Active            Med Note ([place]   Wed Aug 01, 2020  2:17 PM) 10-14 units before meals  insulin degludec (TRESIBA FLEXTOUCH) 100 UNIT/ML FlexTouch Pen [id number] Yes Inject 0.3 mLs (30 Units total) into the skin daily. Crecencio Mc, MD Taking Active            Med Note [name] May 15, 2020  3:47 PM)    ipratropium-albuterol (DUONEB) 0.5-2.5 (3) MG/3ML SOLN [id number]  USE 1 VIAL VIA NEBULIZER EVERY 6 HOURS AS NEEDED Crecencio Mc, MD  Active   Lactobacillus (PROBIOTIC ACIDOPHILUS PO) [id number]  Take 1 capsule by mouth daily. [provider]  Active Self  lactulose (CHRONULAC) 10 GM/15ML solution [id number]  30 ML EVERY 4 HOURS UNTIL CONSTIPATION IS RELIEVED Crecencio Mc, MD  Active   lisinopril (ZESTRIL) 20 MG tablet [id number]  Take 1 tablet (20 mg total) by mouth daily. Crecencio Mc, MD  Active   magnesium oxide (MAG-OX) 400 MG tablet [id number]  Take 400 mg by mouth every other day. [provider]  Active Self  Melatonin 5 MG TABS [id number]  Take 5 mg by mouth at bedtime. [provider]  Active Self  metFORMIN (GLUCOPHAGE) 500 MG tablet [id number] Yes TAKE 1 TABLET (500 MG TOTAL) BY MOUTH 2 (TWO) TIMES DAILY WITH A MEAL. Crecencio Mc, MD Taking Active            Med Note Darnelle Maffucci, [place] Lime   Fri Sep 14, 2020  2:13 PM) 500 mg QAM, 1000 mg QPM  metoprolol  succinate (TOPROL-XL) 25 MG 24 hr tablet [id number]  Take 1 tablet (25 mg total) by mouth daily. Crecencio Mc, MD  Active   ondansetron (ZOFRAN ODT) 4 MG disintegrating tablet [id number]  Take 1 tablet (4 mg total) by mouth every 8 (eight) hours as needed for nausea or vomiting. Crecencio Mc, MD  Active Self  PARoxetine (PAXIL) 10 MG tablet [id number]  TAKE 1 TABLET(10 MG) BY MOUTH EVERY MORNING Crecencio Mc, MD  Active   polyethylene glycol (MIRALAX / GLYCOLAX) 17 g packet [id number]  Take 17 g by mouth 2 (two) times daily. Samuella Cota, MD  Active Self  Potassium 99 MG TABS [id number]  Take 99 mg by mouth daily. Reported on 09/20/2015 [provider]  Active Self           Med Note ([place], ASHLEY L   Thu Dec 06, 2015 10:41 AM)    promethazine (PHENERGAN) 12.5 MG tablet [id number]  TAKE 1 TABLET (12.5 MG TOTAL) BY MOUTH AT BEDTIME AS NEEDED FOR NAUSEA OR VOMITING. Crecencio Mc, MD  Active   rosuvastatin (CRESTOR) 40 MG tablet [id number]  TAKE 1 TABLET BY MOUTH EVERY DAY Crecencio Mc, MD  Active   Semaglutide, 1 MG/DOSE, (OZEMPIC, 1 MG/DOSE,) 4 MG/3ML SOPN [id number] Yes Inject 1 mg into the skin once a week. Crecencio Mc, MD Taking Active           Patient Active Problem List   Diagnosis Date Noted   B12 deficiency 06/30/2020   Heart palpitations 01/19/2020   Palpitations 01/19/2020   Obstipation 01/14/2020   Hospital discharge follow-up 01/14/2020   Colitis 01/07/2020   Abdominal pain 01/07/2020   Educated about COVID-19 virus infection 02/20/2019   Microalbuminuria due to type 2 diabetes mellitus ([place]) 03/25/2018  Bilateral chronic knee pain 12/20/2016   Nausea in adult 12/20/2016   Other specified nonscarring hair loss 12/20/2016   Vitamin D deficiency 04/22/2016   Tachycardia 11/21/2014   Encounter for preventive health examination 09/26/2014   Neck pain of over 3 months duration 09/26/2014   Inflammatory arthritis 06/13/2014    Elevated alkaline phosphatase measurement 10/09/2013   Leg swelling 04/05/2013   OSA (obstructive sleep apnea) 03/01/2013   Allergic rhinitis 02/04/2013   Pulmonary nodule 09/15/2012   Chronic hypoxemic respiratory failure ([place]) 05/03/2012   COPD (chronic obstructive pulmonary disease) ([place]) 04/28/2012   Diabetes mellitus type 2, uncontrolled, with complications ([place]) 82/95/6213   GERD (gastroesophageal reflux disease)    Obesity (BMI 30-39.9)    Anxiety    Tobacco abuse, in remission    Screening for colon cancer    Screening for breast cancer    CAD (coronary artery disease) 12/02/2010   HTN (hypertension) 12/02/2010   Hyperlipidemia 12/02/2010    Immunization History  Administered Date(s) Administered   Fluad Quad(high Dose 65+) 06/26/2020   Hepatitis B 01/21/2012   Influenza Whole 07/10/2011, 08/31/2012   Influenza,inj,Quad PF,6+ Mos 06/12/2014, 10/06/2016, 09/16/2018   Pneumococcal Conjugate-13 06/12/2014   Pneumococcal Polysaccharide-23 12/08/2011   Tdap 12/22/2005    Conditions to be addressed/monitored:  Diabetes, Heart Failure and COPD  Care Plan : Medication Management  Updates made by De Hollingshead, RPH-CPP since 11/20/2020 12:00 AM    Problem: Diabetes, COPD, CHF     Long-Range Goal: Disease Progression Prevention   Recent Progress: On track  Priority: High  Note:   Current Barriers:   Unable to independently afford treatment regimen  Unable to achieve control of diabetes   Pharmacist Clinical Goal(s):   Over the next 90 days, patient will verbalize ability to afford treatment regimen.  Over the next 90 days, patient will achieve control of diabetes as evidenced by improvement in A1c through collaboration with PharmD and provider.   Interventions:  1:1 collaboration with Crecencio Mc, MD regarding development and update of comprehensive plan of care as evidenced by provider attestation and  co-signature  Inter-disciplinary care team collaboration (see longitudinal plan of care)  Comprehensive medication review performed; medication list updated in electronic medical record   Diabetes:  Uncontrolled; current treatment: metformin 1000 mg BID, Ozempic 1 mg weekly, Tresiba 30 units daily, Novolog 10-14 units with meals   Received voicemail from patient requested updated metformin script be sent to reflect our conversation last week to increase to 1000 mg BID. Appears this was already sent via Refill pool.   Attempted to contact patient to discuss. Her phone is not accepting voicemails at this time.   Hypertension:  Uncontrolled at last office visit, but lisinopril dose increased since that time; current treatment: lisinopril 20 mg daily, metoprolol succinate 25 mg daily, potassium 99 mg daily; follows w/ Dr. Laurelyn Sickle  Previously recommended to continue current regimen at this time. PCP f/u due in the next several weeks.   Hyperlipidemia and ASCVD Risk Reduction  Controlled per last lipid panel; current treatment: rosuvastatin 40 mg daily    Antiplatelet regimen: aspirin 81 mg daily   Previously recommended to continue current regimen at this time  Chronic Obstructive Pulmonary Disease:  Controlled; current treatment: Breztri 2 puffs BID, albuterol nebulizer ~3 times daily or albuterol HFA if she is out.   Approved for Breztri assistance through 09/07/21.   Previously recommended to continue current regimen at this time.  Depression/Anxiety:  Moderately well controlled  at this time; current regimen: paroxetine 10 mg daily  Previously recommended to continue current regimen at this time. Monitor for any increased risk of anticholinergic side effects.   Constipation:  Managed on current regimen. Lactulose ~Q3 days, Miralax PRN  Previously recommended  to continue current regimen with hydration.   Patient Goals/Self-Care Activities  Over the next 90 days,  patient will:  - take medications as prescribed check blood glucose using CGM three times daily, document, and provide at future appointments check blood pressure weekly, document, and provide at future appointments collaborate with provider on medication access solutions  Follow Up Plan: Telephone follow up appointment with care management team member scheduled for:~ 6 weeks as previously scheduled         Medication Assistance: Joni Reining, Novolog obtained through [employer] medication assistance program.  Enrollment ends 08/07/21. Breztri  obtained through [employer] medication assistance program.  Enrollment ends 09/07/21  Patient's preferred pharmacy is:  [id number] [id number]: [phone]: [phone]  Care Plan and Follow Up Patient Decision:  Patient agrees to Care Plan and Follow-up.  Plan: Telephone follow up appointment with care management team member scheduled for:  ~ 6 weeks as previously scheduled  Catie TDarnelle Maffucci PharmD, BPurdin CFrewsburgClinical Pharmacist LOccidental Petrole[facility] BJohnson [employer] [phone]

## 2020-11-20 NOTE — Patient Instructions (Signed)
Visit Information  PATIENT GOALS: Goals Addressed              This Visit's Progress     Patient Stated   .  Medication Management (pt-stated)        Patient Goals/Self-Care Activities . Over the next 90 days, patient will:  - take medications as prescribed check blood glucose using CGM three times daily, document, and provide at future appointments check blood pressure weekly, document, and provide at future appointments collaborate with provider on medication access solutions        The patient verbalized understanding of instructions, educational materials, and care plan provided today and declined offer to receive copy of patient instructions, educational materials, and care plan.    Plan: Telephone follow up appointment with care management team member scheduled for:  ~ 6 weeks as previously scheduled  [name], PharmD, [place], CPP Clinical Pharmacist [employer] [facility] [employer] [phone]

## 2020-11-21 ENCOUNTER — Telehealth: Payer: Self-pay

## 2020-11-21 NOTE — Telephone Encounter (Signed)
I called and let patient know that her Novolog Qt. 4 boxes, Ozempic Qt. 5 boxes & Novofine pen needles Qt. 4 boxes all came in from patient assistance. She stated that her son would be by tomorrow to pick up.

## 2020-11-22 ENCOUNTER — Telehealth: Payer: Self-pay

## 2020-11-22 NOTE — Telephone Encounter (Signed)
Pt's son picked up patience assistance medication today at 4:40p.

## 2020-11-23 DIAGNOSIS — G839 Paralytic syndrome, unspecified: Secondary | ICD-10-CM | POA: Diagnosis not present

## 2020-11-23 DIAGNOSIS — J449 Chronic obstructive pulmonary disease, unspecified: Secondary | ICD-10-CM | POA: Diagnosis not present

## 2020-11-27 ENCOUNTER — Other Ambulatory Visit: Payer: Self-pay | Admitting: Internal Medicine

## 2020-11-29 ENCOUNTER — Other Ambulatory Visit: Payer: Self-pay | Admitting: Internal Medicine

## 2020-11-30 ENCOUNTER — Ambulatory Visit: Payer: Medicare HMO | Admitting: Internal Medicine

## 2020-12-04 ENCOUNTER — Other Ambulatory Visit: Payer: Self-pay | Admitting: Internal Medicine

## 2020-12-05 ENCOUNTER — Telehealth: Payer: Medicare HMO

## 2020-12-13 ENCOUNTER — Ambulatory Visit (INDEPENDENT_AMBULATORY_CARE_PROVIDER_SITE_OTHER): Payer: Medicare HMO | Admitting: Internal Medicine

## 2020-12-13 ENCOUNTER — Encounter: Payer: Self-pay | Admitting: Internal Medicine

## 2020-12-13 ENCOUNTER — Other Ambulatory Visit: Payer: Self-pay

## 2020-12-13 VITALS — BP 162/96 | HR 95 | Temp 98.1°F | Resp 17 | Ht 63.0 in | Wt 263.4 lb

## 2020-12-13 DIAGNOSIS — E1165 Type 2 diabetes mellitus with hyperglycemia: Secondary | ICD-10-CM | POA: Diagnosis not present

## 2020-12-13 DIAGNOSIS — B3789 Other sites of candidiasis: Secondary | ICD-10-CM

## 2020-12-13 DIAGNOSIS — E118 Type 2 diabetes mellitus with unspecified complications: Secondary | ICD-10-CM

## 2020-12-13 DIAGNOSIS — R69 Illness, unspecified: Secondary | ICD-10-CM | POA: Diagnosis not present

## 2020-12-13 DIAGNOSIS — IMO0002 Reserved for concepts with insufficient information to code with codable children: Secondary | ICD-10-CM

## 2020-12-13 DIAGNOSIS — L0390 Cellulitis, unspecified: Secondary | ICD-10-CM | POA: Diagnosis not present

## 2020-12-13 DIAGNOSIS — J9611 Chronic respiratory failure with hypoxia: Secondary | ICD-10-CM | POA: Diagnosis not present

## 2020-12-13 DIAGNOSIS — F330 Major depressive disorder, recurrent, mild: Secondary | ICD-10-CM

## 2020-12-13 LAB — COMPREHENSIVE METABOLIC PANEL
ALT: 18 U/L (ref 0–35)
AST: 28 U/L (ref 0–37)
Albumin: 4 g/dL (ref 3.5–5.2)
Alkaline Phosphatase: 87 U/L (ref 39–117)
BUN: 12 mg/dL (ref 6–23)
CO2: 34 mEq/L — ABNORMAL HIGH (ref 19–32)
Calcium: 9.4 mg/dL (ref 8.4–10.5)
Chloride: 101 mEq/L (ref 96–112)
Creatinine, Ser: 0.84 mg/dL (ref 0.40–1.20)
GFR: 72.44 mL/min (ref >60.00)
Glucose, Bld: 128 mg/dL — ABNORMAL HIGH (ref 70–99)
Potassium: 4.2 mEq/L (ref 3.5–5.1)
Sodium: 142 mEq/L (ref 135–145)
Total Bilirubin: 0.5 mg/dL (ref 0.2–1.2)
Total Protein: 6.9 g/dL (ref 6.0–8.3)

## 2020-12-13 LAB — LIPID PANEL
Cholesterol: 112 mg/dL (ref 0–200)
HDL: 39.3 mg/dL (ref >39.00)
LDL Cholesterol: 45 mg/dL (ref 0–99)
NonHDL: 72.62
Total CHOL/HDL Ratio: 3
Triglycerides: 136 mg/dL (ref 0.0–149.0)
VLDL: 27.2 mg/dL (ref 0.0–40.0)

## 2020-12-13 LAB — MICROALBUMIN / CREATININE URINE RATIO
Creatinine,U: 41.5 mg/dL
Microalb Creat Ratio: 10.7 mg/g (ref 0.0–30.0)
Microalb, Ur: 4.4 mg/dL — ABNORMAL HIGH (ref 0.0–1.9)

## 2020-12-13 LAB — HEMOGLOBIN A1C: Hgb A1c MFr Bld: 7 % — ABNORMAL HIGH (ref 4.6–6.5)

## 2020-12-13 MED ORDER — PAROXETINE HCL 20 MG PO TABS: ORAL_TABLET | ORAL | 1 refills | Status: DC

## 2020-12-13 MED ORDER — DOXYCYCLINE HYCLATE 100 MG PO TABS: 100.0000 mg | ORAL_TABLET | Freq: Two times a day (BID) | ORAL | 0 refills | Status: DC

## 2020-12-13 MED ORDER — NYSTATIN 100000 UNIT/GM EX POWD: 1.0000 "application " | Freq: Two times a day (BID) | CUTANEOUS | 0 refills | Status: DC

## 2020-12-13 NOTE — Progress Notes (Signed)
Subjective:  Patient ID: Catherine [name], female    DOB: 08/19/1956  Age: 65 y.o. MRN: [id number]  CC: The primary encounter diagnosis was Diabetes mellitus type 2, uncontrolled, with complications (HCC). Diagnoses of Recurrent cellulitis, Chronic hypoxemic respiratory failure (HCC), Candidiasis of breast, and Mild episode of recurrent major depressive disorder (HCC) were also pertinent to this visit.  HPI Catherine SquiresDeborah [name] presents for follow up on chronic issues  This visit occurred during the SARS-CoV-2 public health emergency.  Safety protocols were in place, including screening questions prior to the visit, additional usage of staff PPE, and extensive cleaning of exam room while observing appropriate contact time as indicated for disinfecting solutions.   She reports that she is "getting over a cold."  She is  unvaccinated.  Had a Covid Infection in December.  Currently reporting Runny nose, congestion, cough productive of clear sputum.  She denies sore throat, fevers,  No taste change   Chronic hypoxic resp failure: She relies on portable oxygen,  Using < 4 L/min  due to "battery issues"  Company is [employer] and was told that using 4 L/min  wears the battery down.  She takes a extra battery with her.  Type 2 DM:  She  Feels at her baseline,  But is not  exercising regularly due to her severe COPD.  She is checking her  blood sugars less than once daily at variable times,  BS have been under 130 fasting and < 150 post prandially.  Denies any recent hypoglyemic events.  Taking   medications as directed. Following a carbohydrate modified diet 6 days per week. Denies numbness, burning and tingling of extremities. Appetite is good.    She has recurrent episodes of a boil on the medial side of her left breast that starts with a tender subcutaneous nodule that becomes red and enlarged. It has been treated with antibiotics in the past without need for I & D   Outpatient Medications Prior to  Visit  Medication Sig Dispense Refill  . acetaminophen (TYLENOL) 325 MG tablet Take 650 mg by mouth every 6 (six) hours as needed. PRN headache    . albuterol (VENTOLIN HFA) 108 (90 Base) MCG/ACT inhaler INHALE 1-2 PUFFS BY MOUTH EVERY 6 HOURS AS NEEDED FOR WHEEZE OR SHORTNESS OF BREATH 8.5 each 3  . aspirin 81 MG EC tablet Take 81 mg by mouth daily.    . Budeson-Glycopyrrol-Formoterol (BREZTRI AEROSPHERE) 160-9-4.8 MCG/ACT AERO Inhale 2 puffs into the lungs 2 (two) times daily. 32.1 g 3  . Cholecalciferol (VITAMIN D3) 125 MCG (5000 UT) CAPS Take 1,000 Units by mouth daily.     . insulin aspart (NOVOLOG) 100 UNIT/ML injection Inject up to 15 units TID with meals 15 mL 0  . insulin degludec (TRESIBA FLEXTOUCH) 100 UNIT/ML FlexTouch Pen Inject 0.3 mLs (30 Units total) into the skin daily. 36 mL 2  . ipratropium-albuterol (DUONEB) 0.5-2.5 (3) MG/3ML SOLN USE 1 VIAL VIA NEBULIZER EVERY 6 HOURS AS NEEDED 270 mL 2  . Lactobacillus (PROBIOTIC ACIDOPHILUS PO) Take 1 capsule by mouth daily.    Marland Kitchen. lactulose (CHRONULAC) 10 GM/15ML solution 30 ML EVERY 4 HOURS UNTIL CONSTIPATION IS RELIEVED 473 mL 1  . lisinopril (ZESTRIL) 20 MG tablet TAKE 1 TABLET BY MOUTH EVERY DAY 90 tablet 1  . magnesium oxide (MAG-OX) 400 MG tablet Take 400 mg by mouth every other day.    . Melatonin 5 MG TABS Take 5 mg by mouth at bedtime.    .Marland Kitchen  metFORMIN (GLUCOPHAGE) 1000 MG tablet Take 1 tablet (1,000 mg total) by mouth 2 (two) times daily with a meal. 180 tablet 1  . metoprolol succinate (TOPROL-XL) 25 MG 24 hr tablet Take 1 tablet (25 mg total) by mouth daily. 90 tablet 3  . ondansetron (ZOFRAN ODT) 4 MG disintegrating tablet Take 1 tablet (4 mg total) by mouth every 8 (eight) hours as needed for nausea or vomiting. 20 tablet 0  . polyethylene glycol (MIRALAX / GLYCOLAX) 17 g packet Take 17 g by mouth 2 (two) times daily.    . Potassium 99 MG TABS Take 99 mg by mouth daily. Reported on 09/20/2015    . promethazine (PHENERGAN) 12.5 MG  tablet TAKE 1 TABLET (12.5 MG TOTAL) BY MOUTH AT BEDTIME AS NEEDED FOR NAUSEA OR VOMITING. 30 tablet 0  . rosuvastatin (CRESTOR) 40 MG tablet TAKE 1 TABLET BY MOUTH EVERY DAY 90 tablet 3  . Semaglutide, 1 MG/DOSE, (OZEMPIC, 1 MG/DOSE,) 4 MG/3ML SOPN Inject 1 mg into the skin once a week. 9 mL 3  . PARoxetine (PAXIL) 10 MG tablet TAKE 1 TABLET(10 MG) BY MOUTH EVERY MORNING 90 tablet 1   No facility-administered medications prior to visit.    Review of Systems;  Patient denies headache, fevers, malaise, unintentional weight loss, skin rash, eye pain, sinus congestion and sinus pain, sore throat, dysphagia,  hemoptysis , cough, dyspnea, wheezing, chest pain, palpitations, orthopnea, edema, abdominal pain, nausea, melena, diarrhea, constipation, flank pain, dysuria, hematuria, urinary  Frequency, nocturia, numbness, tingling, seizures,  Focal weakness, Loss of consciousness,  Tremor, insomnia, depression, anxiety, and suicidal ideation.      Objective:  BP (!) 162/96 (BP Location: Left Arm, Patient Position: Sitting, Cuff Size: Large)   Pulse 95   Temp 98.1 F (36.7 C) (Oral)   Resp 17   Ht 5\' 3"  (1.6 m)   Wt 263 lb 6.4 oz (119.5 kg)   SpO2 95% Comment: 2.5 L o2  BMI 46.66 kg/m   BP Readings from Last 3 Encounters:  12/13/20 (!) 162/96  08/15/20 (!) 154/78  06/26/20 (!) 164/84    Wt Readings from Last 3 Encounters:  12/13/20 263 lb 6.4 oz (119.5 kg)  06/26/20 264 lb 6.4 oz (119.9 kg)  04/05/20 (!) 268 lb (121.6 kg)    General appearance: alert, cooperative and appears stated age Ears: normal TM's and external ear canals both ears Throat: lips, mucosa, and tongue normal; teeth and gums normal Neck: no adenopathy, no carotid bruit, supple, symmetrical, trachea midline and thyroid not enlarged, symmetric, no tenderness/mass/nodules Back: symmetric, no curvature. ROM normal. No CVA tenderness. Lungs: clear to auscultation bilaterally Heart: regular rate and rhythm, S1, S2 normal,  no murmur, click, rub or gallop Abdomen: soft, non-tender; bowel sounds normal; no masses,  no organomegaly Pulses: 2+ and symmetric Skin: Skin color, texture, turgor normal. No rashes or lesions Lymph nodes: Cervical, supraclavicular, and axillary nodes normal.  Lab Results  Component Value Date   HGBA1C 7.0 (H) 12/13/2020   HGBA1C 7.2 (A) 06/26/2020   HGBA1C 7.9 (H) 01/19/2020    Lab Results  Component Value Date   CREATININE 0.84 12/13/2020   CREATININE 0.78 06/26/2020   CREATININE 0.72 01/19/2020    Lab Results  Component Value Date   WBC 10.3 01/19/2020   HGB 13.6 01/19/2020   HCT 41.8 01/19/2020   PLT 293 01/19/2020   GLUCOSE 128 (H) 12/13/2020   CHOL 112 12/13/2020   TRIG 136.0 12/13/2020   HDL 39.30 12/13/2020  LDLDIRECT 74.0 06/26/2020   LDLCALC 45 12/13/2020   ALT 18 12/13/2020   AST 28 12/13/2020   NA 142 12/13/2020   K 4.2 12/13/2020   CL 101 12/13/2020   CREATININE 0.84 12/13/2020   BUN 12 12/13/2020   CO2 34 (H) 12/13/2020   TSH 1.10 01/12/2020   INR 0.9 07/22/2012   HGBA1C 7.0 (H) 12/13/2020   MICROALBUR 4.4 (H) 12/13/2020    CT Angio Chest PE W and/or Wo Contrast  Result Date: 01/19/2020 CLINICAL DATA:  High probability for pulmonary embolism. Palpitations and shortness of breath EXAM: CT ANGIOGRAPHY CHEST WITH CONTRAST TECHNIQUE: Multidetector CT imaging of the chest was performed using the standard protocol during bolus administration of intravenous contrast. Multiplanar CT image reconstructions and MIPs were obtained to evaluate the vascular anatomy. CONTRAST:  OMNIPAQUE IOHEXOL 350 MG/ML SOLN COMPARISON:  Chest CT 06/03/2018 FINDINGS: Cardiovascular: Normal heart size. No pericardial effusion. No acute aortic finding. Suboptimal pulmonary artery opacification, 170 HU at the main pulmonary artery bifurcation, exacerbated by body habitus. No visible pulmonary embolism but very limited beyond the lobar to proximal segmental level.  Mediastinum/Nodes: No adenopathy or mass. Lungs/Pleura: Mild centrilobular emphysema and airway thickening. Numerous pulmonary nodules measuring less than 1 cm. Most notable is clustered nodules at the left base. There has been no progression since 2019, at which time these nodules were also reported as stable. Mild atelectasis. Upper Abdomen: Negative Musculoskeletal: Thoracic spondylosis and degenerative disc narrowing Review of the MIP images confirms the above findings. IMPRESSION: 1. No evidence of pulmonary embolism. Bolus timing and body habitus limits pulmonary artery visualization beyond the lobar to proximal segmental levels. 2. Numerous pulmonary nodules that are stable for years and considered benign. 3. COPD. Electronically Signed   By: Marnee Spring M.D.   On: 01/19/2020 05:48   DG Chest Portable 1 View  Result Date: 01/19/2020 CLINICAL DATA:  Shortness of breath and chest palpitations EXAM: PORTABLE CHEST 1 VIEW COMPARISON:  01/02/2016 FINDINGS: Interstitial coarsening correlating with COPD history. No change from prior. There is no edema, consolidation, effusion, or pneumothorax. Normal heart size and mediastinal contours. IMPRESSION: No acute finding when compared to prior. Chronic bronchitic markings. Electronically Signed   By: Marnee Spring M.D.   On: 01/19/2020 04:19   ECHOCARDIOGRAM COMPLETE  Result Date: 01/20/2020    ECHOCARDIOGRAM REPORT   Patient Name:   [facility] Date of Exam: 01/19/2020 Medical Rec #:  [id number]         Height:       63.0 in Accession #:    [id number]        Weight:       275.0 lb Date of Birth:  [birth year]        BSA:          2.214 m Patient Age:    63 years          BP:           172/95 mmHg Patient Gender: F                 HR:           81 bpm. Exam Location:  [facility] Procedure: 2D Echo, Color Doppler, Cardiac Doppler and Intracardiac            Opacification Agent Indications:     R00.2 Palpitations  History:         Patient has no prior history of  Echocardiogram examinations.  COPD; Risk Factors:Hypertension, Dyslipidemia, Diabetes and                  Former Smoker.  Sonographer:     Humphrey Rolls RDCS (AE) Referring Phys:  [id number] Caryn Bee PACKER Diagnosing Phys: Adrian Blackwater MD  Sonographer Comments: Technically difficult study due to poor echo windows. Image acquisition challenging due to patient body habitus and Image acquisition challenging due to COPD. IMPRESSIONS  1. Left ventricular ejection fraction, by estimation, is 55 to 60%. The left ventricle has normal function. The left ventricle has no regional wall motion abnormalities. Left ventricular diastolic parameters are consistent with Grade I diastolic dysfunction (impaired relaxation).  2. Right ventricular systolic function is normal. The right ventricular size is normal.  3. Left atrial size was mildly dilated.  4. Right atrial size was mildly dilated.  5. The mitral valve was not assessed. No evidence of mitral valve regurgitation. No evidence of mitral stenosis.  6. The aortic valve is normal in structure. Aortic valve regurgitation is not visualized. No aortic stenosis is present.  7. The inferior vena cava is normal in size with greater than 50% respiratory variability, suggesting right atrial pressure of 3 mmHg. FINDINGS  Left Ventricle: Left ventricular ejection fraction, by estimation, is 55 to 60%. The left ventricle has normal function. The left ventricle has no regional wall motion abnormalities. Definity contrast agent was given IV to delineate the left ventricular  endocardial borders. The left ventricular internal cavity size was normal in size. There is no left ventricular hypertrophy. Left ventricular diastolic parameters are consistent with Grade I diastolic dysfunction (impaired relaxation). Right Ventricle: The right ventricular size is normal. No increase in right ventricular wall thickness. Right ventricular systolic function is normal. Left Atrium: Left atrial  size was mildly dilated. Right Atrium: Right atrial size was mildly dilated. Pericardium: There is no evidence of pericardial effusion. Mitral Valve: The mitral valve was not assessed. Normal mobility of the mitral valve leaflets. No evidence of mitral valve regurgitation. No evidence of mitral valve stenosis. MV peak gradient, 5.9 mmHg. The mean mitral valve gradient is 2.0 mmHg. Tricuspid Valve: The tricuspid valve is not assessed. Tricuspid valve regurgitation is not demonstrated. No evidence of tricuspid stenosis. Aortic Valve: The aortic valve is normal in structure. Aortic valve regurgitation is not visualized. No aortic stenosis is present. Aortic valve mean gradient measures 4.0 mmHg. Aortic valve peak gradient measures 8.6 mmHg. Aortic valve area, by VTI measures 3.88 cm. Pulmonic Valve: The pulmonic valve was not assessed. Pulmonic valve regurgitation is not visualized. No evidence of pulmonic stenosis. Aorta: The aortic root is normal in size and structure. Venous: The inferior vena cava is normal in size with greater than 50% respiratory variability, suggesting right atrial pressure of 3 mmHg. IAS/Shunts: No atrial level shunt detected by color flow Doppler.  LEFT VENTRICLE PLAX 2D LVIDd:         4.39 cm  Diastology LVIDs:         3.47 cm  LV e' lateral:   6.53 cm/s LV PW:         1.15 cm  LV E/e' lateral: 13.0 LV IVS:        1.07 cm  LV e' medial:    7.72 cm/s LVOT diam:     2.30 cm  LV E/e' medial:  11.0 LV SV:         87 LV SV Index:   39 LVOT Area:     4.15 cm  LEFT ATRIUM           Index LA diam:      4.10 cm 1.85 cm/m LA Vol (A4C): 26.6 ml 12.02 ml/m  AORTIC VALVE                   PULMONIC VALVE AV Area (Vmax):    3.02 cm    PV Vmax:       0.96 m/s AV Area (Vmean):   3.33 cm    PV Vmean:      59.800 cm/s AV Area (VTI):     3.88 cm    PV VTI:        0.138 m AV Vmax:           147.00 cm/s PV Peak grad:  3.7 mmHg AV Vmean:          97.600 cm/s PV Mean grad:  2.0 mmHg AV VTI:            0.224 m  AV Peak Grad:      8.6 mmHg AV Mean Grad:      4.0 mmHg LVOT Vmax:         107.00 cm/s LVOT Vmean:        78.200 cm/s LVOT VTI:          0.209 m LVOT/AV VTI ratio: 0.93  AORTA Ao Root diam: 3.30 cm MITRAL VALVE MV Area (PHT): 4.76 cm    SHUNTS MV Peak grad:  5.9 mmHg    Systemic VTI:  0.21 m MV Mean grad:  2.0 mmHg    Systemic Diam: 2.30 cm MV Vmax:       1.21 m/s MV Vmean:      68.5 cm/s MV Decel Time: 159 msec MV E velocity: 85.13 cm/s MV A velocity: 91.83 cm/s MV E/A ratio:  0.93 Adrian Blackwater MD Electronically signed by Adrian Blackwater MD Signature Date/Time: 01/20/2020/12:47:16 PM    Final     Assessment & Plan:   Problem List Items Addressed This Visit      Unprioritized   Candidiasis of breast    Recurrent during warm weather.  Refilling nystatin for prn use      Relevant Medications   nystatin (MYCOSTATIN/NYSTOP) powder   Chronic hypoxemic respiratory failure (HCC)    She is chronically oxygen dependent , requiring 4 L/min, which requires her to travel with additional battery packs to maintain equipment.       Diabetes mellitus type 2, uncontrolled, with complications (HCC) - Primary    Now controlled with higher metformin dose, Ozempic,  Tresiba and  mealtime insulin. She has lost weight as well.  Lab Results  Component Value Date   HGBA1C 7.0 (H) 12/13/2020   Lab Results  Component Value Date   MICROALBUR 4.4 (H) 12/13/2020   MICROALBUR 4.5 (H) 03/23/2018            Relevant Orders   Hemoglobin A1c (Completed)   Comprehensive metabolic panel (Completed)   Lipid panel (Completed)   Microalbumin / creatinine urine ratio (Completed)   Major depressive disorder with current active episode    discussed increasing dose of paroxetine to 20 mg daily to manage symptoms of anhedonia and anxiety      Relevant Medications   PARoxetine (PAXIL) 20 MG tablet   Recurrent cellulitis    Involving medial lower quadrant of left breast.  Refilling doxycycline for prn use/           I provided  30 minutes  of  face-to-face time during this encounter reviewing patient's current problems and past surgeries, labs and imaging studies, providing counseling on the above mentioned problems , and coordination  of care .   I have changed Catherine [name] "Debbie"'s PARoxetine. I am also having her start on doxycycline and nystatin. Additionally, I am having her maintain her aspirin, Potassium, Vitamin D3, melatonin, Lactobacillus (PROBIOTIC ACIDOPHILUS PO), magnesium oxide, acetaminophen, polyethylene glycol, ondansetron, metoprolol succinate, Evaristo Bury FlexTouch, Ozempic (1 MG/DOSE), [employer], rosuvastatin, lactulose, insulin aspart, albuterol, metFORMIN, promethazine, lisinopril, and ipratropium-albuterol.  Meds ordered this encounter  Medications  . doxycycline (VIBRA-TABS) 100 MG tablet    Sig: Take 1 tablet (100 mg total) by mouth 2 (two) times daily.    Dispense:  20 tablet    Refill:  0  . nystatin (MYCOSTATIN/NYSTOP) powder    Sig: Apply 1 application topically 2 (two) times daily. To rash under breasts until resolved.    Dispense:  15 g    Refill:  0  . PARoxetine (PAXIL) 20 MG tablet    Sig: TAKE 1 TABLET(10 MG) BY MOUTH EVERY MORNING    Dispense:  90 tablet    Refill:  1    Medications Discontinued During This Encounter  Medication Reason  . PARoxetine (PAXIL) 10 MG tablet     Follow-up: No follow-ups on file.   Sherlene Shams, MD

## 2020-12-13 NOTE — Patient Instructions (Addendum)
  Increase your  claritin  To every 12 hours for allergies   Increase your paxil to 20  Mg daily to improve your depression    Doxycycline sent to pharmacy;  USE FOR CELLULITIS OF BREAST   NYSTATIN POWDER  SENT: USE FOR BREAST RASH (FROM YEAST)   USE GOLD BOND MEDICATED POWDER TO PREVENT YEAST INFECTIONS DUE TO STAYING MOIST

## 2020-12-14 ENCOUNTER — Ambulatory Visit: Payer: Medicare HMO | Admitting: Internal Medicine

## 2020-12-16 DIAGNOSIS — B3789 Other sites of candidiasis: Secondary | ICD-10-CM | POA: Insufficient documentation

## 2020-12-16 NOTE — Assessment & Plan Note (Addendum)
Involving medial lower quadrant of left breast.  Refilling doxycycline for prn use/

## 2020-12-16 NOTE — Assessment & Plan Note (Signed)
Recurrent during warm weather.  Refilling nystatin for prn use

## 2020-12-16 NOTE — Assessment & Plan Note (Signed)
Now controlled with higher metformin dose, Ozempic,  Tresiba and  mealtime insulin. She has lost weight as well.  Lab Results  Component Value Date   HGBA1C 7.0 (H) 12/13/2020   Lab Results  Component Value Date   MICROALBUR 4.4 (H) 12/13/2020   MICROALBUR 4.5 (H) 03/23/2018

## 2020-12-16 NOTE — Assessment & Plan Note (Signed)
discussed increasing dose of paroxetine to 20 mg daily to manage symptoms of anhedonia and anxiety

## 2020-12-16 NOTE — Assessment & Plan Note (Signed)
She is chronically oxygen dependent , requiring 4 L/min, which requires her to travel with additional battery packs to maintain equipment.  

## 2020-12-19 ENCOUNTER — Telehealth: Payer: Self-pay

## 2020-12-19 NOTE — Telephone Encounter (Addendum)
Received pt's Catherine [name] from [employer] today.   Catherine [name]: 2 boxes  Pt is aware medication has arrived and is ready to be picked up.

## 2020-12-20 ENCOUNTER — Ambulatory Visit (INDEPENDENT_AMBULATORY_CARE_PROVIDER_SITE_OTHER): Payer: Medicare HMO | Admitting: Pharmacist

## 2020-12-20 DIAGNOSIS — E1165 Type 2 diabetes mellitus with hyperglycemia: Secondary | ICD-10-CM

## 2020-12-20 DIAGNOSIS — J432 Centrilobular emphysema: Secondary | ICD-10-CM

## 2020-12-20 DIAGNOSIS — I2510 Atherosclerotic heart disease of native coronary artery without angina pectoris: Secondary | ICD-10-CM | POA: Diagnosis not present

## 2020-12-20 DIAGNOSIS — J9611 Chronic respiratory failure with hypoxia: Secondary | ICD-10-CM

## 2020-12-20 DIAGNOSIS — I10 Essential (primary) hypertension: Secondary | ICD-10-CM

## 2020-12-20 DIAGNOSIS — E118 Type 2 diabetes mellitus with unspecified complications: Secondary | ICD-10-CM

## 2020-12-20 DIAGNOSIS — IMO0002 Reserved for concepts with insufficient information to code with codable children: Secondary | ICD-10-CM

## 2020-12-20 DIAGNOSIS — E782 Mixed hyperlipidemia: Secondary | ICD-10-CM

## 2020-12-20 NOTE — Patient Instructions (Addendum)
Catherine [name],   It was great talking with you today!  Here is your "homework"  1) Schedule COVID vaccination. You can call your local pharmacy or call the [facility]ine line to set up an appointment at [phone]. This is a top priority.  2) Schedule your yearly diabetic eye exam. Have them fax those results to Dr. Darrick Huntsman at [phone]. 3) Schedule in office follow up with Dr. Darrick Huntsman in July/August (as long as after July 7th).  4) Eventually, we recommend you get a Prevnar 20 vaccination (pneumonia) and a Tdap booster (tetanus, diptheria, and pertussis - whooping cough). Tdap needs to be gotten at your local pharmacy. We can administer Prevnar at our office.  5) Call [employer] to report the [employer] inhaler that doesn't work.  6) If you need troubleshooting help, call [facility] Customer Service at [phone] 7 Days a Week from 8AM to 8PM  7) Eventually, you need to schedule a mammogram.   Call me with any questions or concerns!  Catherine [name], PharmD [phone]  Visit Information  PATIENT GOALS: Goals Addressed              This Visit's Progress     Patient Stated   .  Medication Management (pt-stated)        Patient Goals/Self-Care Activities . Over the next 90 days, patient will:  - take medications as prescribed check blood glucose using CGM three times daily, document, and provide at future appointments check blood pressure weekly, document, and provide at future appointments collaborate with provider on medication access solutions       The patient verbalized understanding of instructions, educational materials, and care plan provided today and agreed to receive a mailed copy of patient instructions, educational materials, and care plan.   Plan: Telephone follow up appointment with care management team member scheduled for:  ~ 12 weeks  Catherine [name], PharmD, [place], CPP Clinical Pharmacist [employer] at [employer] [phone]

## 2020-12-20 NOTE — Chronic Care Management (AMB) (Signed)
**Note Catherine-Identified via Obfuscation** Chronic Care Management Pharmacy Note  12/20/2020 Name:  Catherine [name] MRN:  [id number] DOB:  [birth year]  Subjective: Catherine [name] is an 65 y.o. year old female who is a primary patient of Catherine [name], Catherine Everts, MD.  The CCM team was consulted for assistance with disease management and care coordination needs.    Engaged with patient by telephone for follow up visit in response to provider referral for pharmacy case management and/or care coordination services.   Consent to Services:  The patient was given information about Chronic Care Management services, agreed to services, and gave verbal consent prior to initiation of services.  Please see initial visit note for detailed documentation.   Patient Care Team: Catherine Mc, MD as PCP - General (Internal Medicine) Catherine [name], RPH-CPP as Pharmacist (Pharmacist)  Recent office visits: 4/7 - PCP f/u. Lab work at goal. Increased paroxetine.  Recent consult visits: None since our last call  Hospital visits: None in previous 6 months  Objective:  Lab Results  Component Value Date   CREATININE 0.84 12/13/2020   CREATININE 0.78 06/26/2020   CREATININE 0.72 01/19/2020    Lab Results  Component Value Date   HGBA1C 7.0 (H) 12/13/2020   Last diabetic Eye exam:  Lab Results  Component Value Date/Time   HMDIABEYEEXA No Retinopathy 01/30/2016 08:09 AM    Last diabetic Foot exam:  Lab Results  Component Value Date/Time   HMDIABFOOTEX normal 10/07/2013 12:00 AM        Component Value Date/Time   CHOL 112 12/13/2020 1203   CHOL 185 11/11/2011 0914   TRIG 136.0 12/13/2020 1203   TRIG 166 11/11/2011 0914   HDL 39.30 12/13/2020 1203   HDL 47 11/11/2011 0914   CHOLHDL 3 12/13/2020 1203   VLDL 27.2 12/13/2020 1203   VLDL 33 11/11/2011 0914   LDLCALC 45 12/13/2020 1203   LDLCALC 73 12/10/2018 1447   LDLCALC 105 (H) 11/11/2011 0914   LDLDIRECT 74.0 06/26/2020 1441    Hepatic Function Latest Ref Rng &  Units 12/13/2020 06/26/2020 01/12/2020  Total Protein 6.0 - 8.3 g/dL 6.9 6.9 6.7  Albumin 3.5 - 5.2 g/dL 4.0 4.0 3.7  AST 0 - 37 U/L 28 35 36  ALT 0 - 35 U/L '18 19 25  ' Alk Phosphatase 39 - 117 U/L 87 95 95  Total Bilirubin 0.2 - 1.2 mg/dL 0.5 0.3 0.4  Bilirubin, Direct 0.0 - 0.3 mg/dL - - -    Lab Results  Component Value Date/Time   TSH 1.10 01/12/2020 11:36 AM   TSH 1.05 12/10/2018 02:47 PM    CBC Latest Ref Rng & Units 01/19/2020 01/16/2020 01/08/2020  WBC 4.0 - 10.5 K/uL 10.3 9.9 12.8(H)  Hemoglobin 12.0 - 15.0 g/dL 13.6 13.5 12.2  Hematocrit 36.0 - 46.0 % 41.8 40.7 38.0  Platelets 150 - 400 K/uL 293 316 267    Lab Results  Component Value Date/Time   VD25OH 37 12/10/2018 02:47 PM   VD25OH 31.92 03/23/2018 04:40 PM   VD25OH 31 08/08/2016 04:44 PM   VD25OH 3.48 (L) 04/21/2016 10:20 AM    Clinical ASCVD: Yes    Social History   Tobacco Use  Smoking Status Former Smoker  . Packs/day: 1.00  . Years: 40.00  . Pack years: 40.00  . Types: Cigarettes  . Quit date: 06/27/2011  . Years since quitting: 9.4  Smokeless Tobacco Never Used   BP Readings from Last 3 Encounters:  12/13/20 (!) 162/96  08/15/20 (!) 154/78  06/26/20 (!) 164/84   Pulse Readings from Last 3 Encounters:  12/13/20 95  08/15/20 91  06/26/20 (!) 120   Wt Readings from Last 3 Encounters:  12/13/20 263 lb 6.4 oz (119.5 kg)  06/26/20 264 lb 6.4 oz (119.9 kg)  04/05/20 (!) 268 lb (121.6 kg)    Assessment: Review of patient past medical history, allergies, medications, health status, including review of consultants reports, laboratory and other test data, was performed as part of comprehensive evaluation and provision of chronic care management services.   SDOH:  (Social Determinants of Health) assessments and interventions performed:  SDOH Interventions   Flowsheet Row Most Recent Value  SDOH Interventions   Financial Strain Interventions Other (Comment)  [manufacturer assistance]      CCM Care  Plan  Allergies  Allergen Reactions  . Sulfa Antibiotics Swelling    Patient reports caused cellulitis Other reaction(s): Other (See Comments) cellulitis  . Hydroxychloroquine     Other reaction(s): Other (See Comments) Gi upset  . Oxycodone-Acetaminophen   . Percocet [Oxycodone-Acetaminophen]   . Vicodin [Hydrocodone-Acetaminophen]   . Aspirin Other (See Comments)    Other reaction(s): Blood Disorder  . Ibuprofen     Other reaction(s): Blood Disorder  . Omnipaque [Iohexol] Itching    Patient states she has had IV contrast multiple times without any reaction. But she said on Jan 07, 2020 after injection she had terrible itching.  They gave her Benadryl which stopped it immediately.  Told Dr. Alfred Levins this and she elected to give 50 mg IV Benadryl before scan today 01-19-2020.  She had no reaction after injection today. It wasn't documented on 01-07-2020.    Medications Reviewed Today    Reviewed by Catherine [name], RPH-CPP (Pharmacist) on 12/20/20 at 1359  Med List Status: <None>  Medication Order Taking? Sig Documenting Provider Last Dose Status Informant  acetaminophen (TYLENOL) 325 MG tablet [id number]  Take 650 mg by mouth every 6 (six) hours as needed. PRN headache [provider]  Active Self  albuterol (VENTOLIN HFA) 108 (90 Base) MCG/ACT inhaler [id number] Yes INHALE 1-2 PUFFS BY MOUTH EVERY 6 HOURS AS NEEDED FOR WHEEZE OR SHORTNESS OF BREATH Catherine Mc, MD Taking Active   aspirin 81 MG EC tablet [id number] Yes Take 81 mg by mouth daily. [provider] Taking Active Self  Budeson-Glycopyrrol-Formoterol (BREZTRI AEROSPHERE) 160-9-4.8 MCG/ACT AERO [id number] Yes Inhale 2 puffs into the lungs 2 (two) times daily. Catherine Mc, MD Taking Active   Cholecalciferol (VITAMIN D3) 125 MCG (5000 UT) CAPS [id number] Yes Take 1,000 Units by mouth daily.  [provider] Taking Active Self  insulin aspart (NOVOLOG) 100 UNIT/ML injection [id number] Yes Inject up to  15 units TID with meals Catherine Mc, MD Taking Active   insulin degludec (TRESIBA FLEXTOUCH) 100 UNIT/ML FlexTouch Pen [id number] Yes Inject 0.3 mLs (30 Units total) into the skin daily. Catherine Mc, MD Taking Active            Med Note Catherine [name] May 15, 2020  3:47 PM)    ipratropium-albuterol (DUONEB) 0.5-2.5 (3) MG/3ML SOLN [id number] Yes USE 1 VIAL VIA NEBULIZER EVERY 6 HOURS AS NEEDED Catherine Mc, MD Taking Active   Lactobacillus (PROBIOTIC ACIDOPHILUS PO) [id number] Yes Take 1 capsule by mouth daily. [provider] Taking Active Self  lactulose (CHRONULAC) 10 GM/15ML solution [id number] Yes [place] CONSTIPATION IS RELIEVED Catherine Mc, MD Taking Active   lisinopril (ZESTRIL)  20 MG tablet [id number] Yes TAKE 1 TABLET BY MOUTH EVERY DAY Catherine Mc, MD Taking Active   magnesium oxide (MAG-OX) 400 MG tablet [id number] Yes Take 400 mg by mouth every other day. [provider] Taking Active Self  Melatonin 5 MG TABS [id number] Yes Take 5 mg by mouth at bedtime. [provider] Taking Active Self  metFORMIN (GLUCOPHAGE) 1000 MG tablet [id number] Yes Take 1 tablet (1,000 mg total) by mouth 2 (two) times daily with a meal. Catherine Mc, MD Taking Active   metoprolol succinate (TOPROL-XL) 25 MG 24 hr tablet [id number] Yes Take 1 tablet (25 mg total) by mouth daily. Catherine Mc, MD Taking Active   nystatin (MYCOSTATIN/NYSTOP) powder [id number] No Apply 1 application topically 2 (two) times daily. To rash under breasts until resolved.  Patient not taking: Reported on 12/20/2020   Catherine Mc, MD Not Taking Active   ondansetron (ZOFRAN ODT) 4 MG disintegrating tablet [id number] No Take 1 tablet (4 mg total) by mouth every 8 (eight) hours as needed for nausea or vomiting.  Patient not taking: Reported on 12/20/2020   Catherine Mc, MD Not Taking Active Self  PARoxetine (PAXIL) 20 MG tablet [id number] Yes TAKE 1 TABLET(10 MG)  BY MOUTH EVERY MORNING Catherine Mc, MD Taking Active   Potassium 99 MG TABS [id number] Yes Take 99 mg by mouth daily. Reported on 09/20/2015 [provider] Taking Active Self           Med Note (CAULFIELD, ASHLEY L   Thu Dec 06, 2015 10:41 AM)    promethazine (PHENERGAN) 12.5 MG tablet [id number] No TAKE 1 TABLET (12.5 MG TOTAL) BY MOUTH AT BEDTIME AS NEEDED FOR NAUSEA OR VOMITING.  Patient not taking: Reported on 12/20/2020   Catherine Mc, MD Not Taking Active   rosuvastatin (CRESTOR) 40 MG tablet [id number] Yes TAKE 1 TABLET BY MOUTH EVERY DAY Catherine Mc, MD Taking Active   Semaglutide, 1 MG/DOSE, (OZEMPIC, 1 MG/DOSE,) 4 MG/3ML SOPN [id number] Yes Inject 1 mg into the skin once a week. Catherine Mc, MD Taking Active           Patient Active Problem List   Diagnosis Date Noted  . Candidiasis of breast 12/16/2020  . Recurrent cellulitis 12/13/2020  . B12 deficiency 06/30/2020  . Heart palpitations 01/19/2020  . Palpitations 01/19/2020  . Obstipation 01/14/2020  . Hospital discharge follow-up 01/14/2020  . Colitis 01/07/2020  . Abdominal pain 01/07/2020  . Educated about COVID-19 virus infection 02/20/2019  . Microalbuminuria due to type 2 diabetes mellitus ([place]) 03/25/2018  . Bilateral chronic knee pain 12/20/2016  . Nausea in adult 12/20/2016  . Other specified nonscarring hair loss 12/20/2016  . Boil, breast 08/08/2016  . Vitamin D deficiency 04/22/2016  . Tachycardia 11/21/2014  . Encounter for preventive health examination 09/26/2014  . Neck pain of over 3 months duration 09/26/2014  . Inflammatory arthritis 06/13/2014  . Elevated alkaline phosphatase measurement 10/09/2013  . Leg swelling 04/05/2013  . OSA (obstructive sleep apnea) 03/01/2013  . Allergic rhinitis 02/04/2013  . Pulmonary nodule 09/15/2012  . Chronic hypoxemic respiratory failure ([place]) 05/03/2012  . COPD (chronic obstructive pulmonary disease) ([place]) 04/28/2012  . Diabetes mellitus type  2, uncontrolled, with complications ([place]) 28/76/8115  . GERD (gastroesophageal reflux disease)   . Obesity (BMI 30-39.9)   . Major depressive disorder with current active episode   . Anxiety   . Tobacco abuse, in remission   . Screening for colon  cancer   . Screening for breast cancer   . CAD (coronary artery disease) 12/02/2010  . HTN (hypertension) 12/02/2010  . Hyperlipidemia 12/02/2010    Immunization History  Administered Date(s) Administered  . Fluad Quad(high Dose 65+) 06/26/2020  . Hepatitis B 01/21/2012  . Influenza Whole 07/10/2011, 08/31/2012  . Influenza,inj,Quad PF,6+ Mos 06/12/2014, 10/06/2016, 09/16/2018  . Pneumococcal Conjugate-13 06/12/2014  . Pneumococcal Polysaccharide-23 12/08/2011  . Tdap 12/22/2005    Conditions to be addressed/monitored: CHF, HTN, HLD and DMII  Care Plan : Medication Management  Updates made by Catherine [name], RPH-CPP since 12/20/2020 12:00 AM    Problem: Diabetes, COPD, CHF     Long-Range Goal: Disease Progression Prevention   This Visit's Progress: On track  Recent Progress: On track  Priority: High  Note:   Current Barriers:  . Unable to independently afford treatment regimen . Unable to achieve control of diabetes   Pharmacist Clinical Goal(s):  Marland Kitchen Over the next 90 days, patient will verbalize ability to afford treatment regimen. . Over the next 90 days, patient will achieve control of diabetes as evidenced by improvement in A1c through collaboration with PharmD and provider.   Interventions: . 1:1 collaboration with Catherine Mc, MD regarding development and update of comprehensive plan of care as evidenced by provider attestation and co-signature . Inter-disciplinary care team collaboration (see longitudinal plan of care) . Comprehensive medication review performed; medication list updated in electronic medical record  Health Maintenance: . Eye exam - due, patient is aware she needs to schedule . Mammogram -  reports that she and Dr. Derrel [name] discussed deferring given recent cold symptoms/pulmonary infection . Colonoscopy - reports that she is not a candidate for anesthesia . COVID vaccinations - reports that she and Dr. Derrel [name] discussed pursuing sometime in May . Pneumonia - recommend PCV20. Recommend prioritizing COVID vaccination . Tdap - recommend getting at local pharmacy. Recommend prioritizing COVID vaccination  Diabetes: . A1c goal A1c 7%; current treatment: metformin 1000 mg BID, Ozempic 1 mg weekly, Tresiba 30 units daily, Novolog 14-15 units with meals  o No hx SGLT2 per chart review. Will consider moving forward for renal protection . Approved for Joni Reining, Novolog assistance through 2022 . Current glucose readings- using Libre 2 CGM. Reports that it has been alarming more frequently but not for highs or lows. She cuts her phone off to make it  Date of Download: 3/31-4/13/22 % Time CGM is active: 28% (<&0% limits accuracy of interpretation) Average Glucose:  156 mg/dL Glucose Management Indicator: n/a  Glucose Variability: n/a (goal <36%) Time in Goal:  - Time in range 70-180: 77% - Time above range: 23% - Time below range: 0% . Reviewed CGM download. Appears patient's phone is beeping at her because alarms aren't connected. May be that bluetooth is off. She will play around with system settings and call [employer] help desk if she can't fix the issue herself.  . Continue current regimen at this time. Likely plan to increase Ozempic to 2 mg weekly when this dose is available from the manufacturer.   Hypertension: . Elevated at last office visit, has not been checking as consistently at home; current treatment: lisinopril 20 mg daily, metoprolol succinate 25 mg daily, potassium 99 mg daily; follows w/ Dr. Laurelyn Sickle . Asked what microalbumin is. Discussed urinary microalbumin/creatitine ratio and significance.  . Could consider SGLT2 moving forward for renal protection . Recommended to  continue current regimen at this time. Encouraged periodic home BP checks.  Hyperlipidemia and ASCVD Risk Reduction . Controlled per last lipid panel; current treatment: rosuvastatin 40 mg daily   . Antiplatelet regimen: aspirin 81 mg daily  . Praised for improvement in TG with improvement in glycemic control, focus on dietary choices. Encouraged continued adherence. . Recommended to continue current regimen at this time  Chronic Obstructive Pulmonary Disease: . Controlled; current treatment: Breztri 2 puffs BID, albuterol nebulizer ~3 times daily or albuterol HFA if she is out; reports more frequent use over the past several weeks due to cold.  Marland Kitchen Approved for Breztri assistance through 09/07/21.  . Reports she had a Breztri inhaler that "stopped working". Advised she call manufacturer assistance program to report and see if they would send her a replacement or help troubleshoot. She will do so . Recommended to continue current regimen at this time. Continue weight loss efforts to improve breathing  Depression/Anxiety: . Moderately well controlled at this time; current regimen: paroxetine 20 mg daily - has not increased yet, plans to start next week . Continue plan to increase paroxetine per PCP. Monitor for any increased incidence of anticholinergic side effects.   Constipation: . Managed on current regimen. Lactulose ~Q3 days if she has not had a bowel movement . Recommended to continue current regimen with focus on hydration, fiber. Constipation may limit use of higher Ozempic doses. Will follow.   Patient Goals/Self-Care Activities . Over the next 90 days, patient will:  - take medications as prescribed check blood glucose using CGM three times daily, document, and provide at future appointments check blood pressure weekly, document, and provide at future appointments collaborate with provider on medication access solutions  Follow Up Plan: Telephone follow up appointment with care  management team member scheduled for:~ 12 weeks      Medication Assistance: Joni Reining, Novolog obtained through [employer] medication assistance program.  Enrollment ends 08/07/21. Breztri  obtained through [employer] medication assistance program.  Enrollment ends 09/07/21  Patient's preferred pharmacy is:  [id number] [place] [id number]: [phone]: [phone]   Follow Up:  Patient agrees to Care Plan and Follow-up.  Plan: Telephone follow up appointment with care management team member scheduled for:  ~ 12 weeks  Catie TDarnelle Maffucci PharmD, BLa [place]cond[place] CAquebogueClinical Pharmacist LOccidental Petroleumat BJohnson & [phone]

## 2020-12-24 DIAGNOSIS — G839 Paralytic syndrome, unspecified: Secondary | ICD-10-CM | POA: Diagnosis not present

## 2020-12-24 DIAGNOSIS — J449 Chronic obstructive pulmonary disease, unspecified: Secondary | ICD-10-CM | POA: Diagnosis not present

## 2020-12-25 NOTE — Telephone Encounter (Signed)
Pt's son has picked up medication  

## 2021-01-05 ENCOUNTER — Other Ambulatory Visit: Payer: Self-pay | Admitting: Internal Medicine

## 2021-01-13 ENCOUNTER — Other Ambulatory Visit: Payer: Self-pay | Admitting: Internal Medicine

## 2021-01-18 ENCOUNTER — Other Ambulatory Visit: Payer: Self-pay | Admitting: Internal Medicine

## 2021-01-22 DIAGNOSIS — E1165 Type 2 diabetes mellitus with hyperglycemia: Secondary | ICD-10-CM | POA: Diagnosis not present

## 2021-01-23 DIAGNOSIS — G839 Paralytic syndrome, unspecified: Secondary | ICD-10-CM | POA: Diagnosis not present

## 2021-01-23 DIAGNOSIS — J449 Chronic obstructive pulmonary disease, unspecified: Secondary | ICD-10-CM | POA: Diagnosis not present

## 2021-02-05 ENCOUNTER — Encounter: Payer: Self-pay | Admitting: Adult Health

## 2021-02-05 ENCOUNTER — Telehealth (INDEPENDENT_AMBULATORY_CARE_PROVIDER_SITE_OTHER): Payer: Medicare HMO | Admitting: Adult Health

## 2021-02-05 VITALS — Ht 63.0 in | Wt 264.0 lb

## 2021-02-05 DIAGNOSIS — B9689 Other specified bacterial agents as the cause of diseases classified elsewhere: Secondary | ICD-10-CM

## 2021-02-05 DIAGNOSIS — J441 Chronic obstructive pulmonary disease with (acute) exacerbation: Secondary | ICD-10-CM | POA: Diagnosis not present

## 2021-02-05 DIAGNOSIS — J069 Acute upper respiratory infection, unspecified: Secondary | ICD-10-CM | POA: Diagnosis not present

## 2021-02-05 MED ORDER — BENZONATATE 100 MG PO CAPS: 100.0000 mg | ORAL_CAPSULE | Freq: Two times a day (BID) | ORAL | 0 refills | Status: DC | PRN

## 2021-02-05 MED ORDER — PREDNISONE 10 MG (21) PO TBPK: ORAL_TABLET | ORAL | 0 refills | Status: DC

## 2021-02-05 MED ORDER — AMOXICILLIN-POT CLAVULANATE 875-125 MG PO TABS
1.0000 | ORAL_TABLET | Freq: Two times a day (BID) | ORAL | 0 refills | Status: DC
Start: 2021-02-05 — End: 2021-10-17

## 2021-02-05 NOTE — Patient Instructions (Signed)
Chronic Obstructive Pulmonary Disease Exacerbation Chronic obstructive pulmonary disease (COPD) is a long-term (chronic) lung problem. In COPD, the flow of air from the lungs is limited. COPD exacerbations are times that breathing gets worse and you need more than your normal treatment. Without treatment, they can be life threatening. If they happen often, your lungs can become more damaged. If your COPD gets worse, your doctor may treat you with:  Medicines.  Oxygen.  Different ways to clear your airway, such as using a mask. Follow these instructions at home: Medicines  Take over-the-counter and prescription medicines only as told by your doctor.  If you take an antibiotic or steroid medicine, do not stop taking the medicine even if you start to feel better.  Keep up with shots (vaccinations) as told by your doctor. Be sure to get a yearly (annual) flu shot. Lifestyle  Do not smoke. If you need help quitting, ask your doctor.  Eat healthy foods.  Exercise regularly.  Get plenty of sleep.  Avoid tobacco smoke and other things that can bother your lungs.  Wash your hands often with soap and water. This will help keep you from getting an infection. If you cannot use soap and water, use hand sanitizer.  During flu season, avoid areas that are crowded with people. General instructions  Drink enough fluid to keep your pee (urine) clear or pale yellow. Do not do this if your doctor has told you not to.  Use a cool mist machine (vaporizer).  If you use oxygen or a machine that turns medicine into a mist (nebulizer), continue to use it as told.  Follow all instructions for rehabilitation. These are steps you can take to make your body work better.  Keep all follow-up visits as told by your doctor. This is important. Contact a doctor if:  Your COPD symptoms get worse than normal. Get help right away if:  You are short of breath and it gets worse.  You have trouble  talking.  You have chest pain.  You cough up blood.  You have a fever.  You keep throwing up (vomiting).  You feel weak or you pass out (faint).  You feel confused.  You are not able to sleep because of your symptoms.  You are not able to do daily activities. Summary  COPD exacerbations are times that breathing gets worse and you need more treatment than normal.  COPD exacerbations can be very serious and may cause your lungs to become more damaged.  Do not smoke. If you need help quitting, ask your doctor.  Stay up-to-date on your shots. Get a flu shot every year. This information is not intended to replace advice given to you by your health care provider. Make sure you discuss any questions you have with your health care provider. Document Revised: 08/07/2017 Document Reviewed: 09/29/2016 Elsevier Patient Education  2021 Elsevier Inc. Upper Respiratory Infection, Adult An upper respiratory infection (URI) affects the nose, throat, and upper air passages. URIs are caused by germs (viruses). The most common type of URI is often called "the common cold." Medicines cannot cure URIs, but you can do things at home to relieve your symptoms. URIs usually get better within 7-10 days. Follow these instructions at home: Activity  Rest as needed.  If you have a fever, stay home from work or school until your fever is gone, or until your doctor says you may return to work or school. ? You should stay home until you cannot spread  the infection anymore (you are not contagious). ? Your doctor may have you wear a face mask so you have less risk of spreading the infection. Relieving symptoms  Gargle with a salt-water mixture 3-4 times a day or as needed. To make a salt-water mixture, completely dissolve -1 tsp of salt in 1 cup of warm water.  Use a cool-mist humidifier to add moisture to the air. This can help you breathe more easily. Eating and drinking  Drink enough fluid to keep  your pee (urine) pale yellow.  Eat soups and other clear broths.   General instructions  Take over-the-counter and prescription medicines only as told by your doctor. These include cold medicines, fever reducers, and cough suppressants.  Do not use any products that contain nicotine or tobacco. These include cigarettes and e-cigarettes. If you need help quitting, ask your doctor.  Avoid being where people are smoking (avoid secondhand smoke).  Make sure you get regular shots and get the flu shot every year.  Keep all follow-up visits as told by your doctor. This is important.   How to avoid spreading infection to others  Wash your hands often with soap and water. If you do not have soap and water, use hand sanitizer.  Avoid touching your mouth, face, eyes, or nose.  Cough or sneeze into a tissue or your sleeve or elbow. Do not cough or sneeze into your hand or into the air.   Contact a doctor if:  You are getting worse, not better.  You have any of these: ? A fever. ? Chills. ? Brown or red mucus in your nose. ? Yellow or brown fluid (discharge)coming from your nose. ? Pain in your face, especially when you bend forward. ? Swollen neck glands. ? Pain with swallowing. ? White areas in the back of your throat. Get help right away if:  You have shortness of breath that gets worse.  You have very bad or constant: ? Headache. ? Ear pain. ? Pain in your forehead, behind your eyes, and over your cheekbones (sinus pain). ? Chest pain.  You have long-lasting (chronic) lung disease along with any of these: ? Wheezing. ? Long-lasting cough. ? Coughing up blood. ? A change in your usual mucus.  You have a stiff neck.  You have changes in your: ? Vision. ? Hearing. ? Thinking. ? Mood. Summary  An upper respiratory infection (URI) is caused by a germ called a virus. The most common type of URI is often called "the common cold."  URIs usually get better within 7-10  days.  Take over-the-counter and prescription medicines only as told by your doctor. This information is not intended to replace advice given to you by your health care provider. Make sure you discuss any questions you have with your health care provider. Document Revised: 05/03/2020 Document Reviewed: 05/03/2020 Elsevier Patient Education  2021 [employer].

## 2021-02-05 NOTE — Progress Notes (Signed)
Virtual Visit via Video Note  I connected with Catherine [name] on 02/05/21 at  4:30 PM EDT by a video enabled telemedicine application and verified that I am speaking with the correct person using two identifiers.  Parties involved in visit as below:   Location: Patient: at home Provider: Provider: Provider's office at  [facility], [place].      I discussed the limitations of evaluation and management by telemedicine and the availability of in person appointments. The patient expressed understanding and agreed to proceed.  History of Present Illness: Patient is a 65 year old female in no acute distress, who reports she has been coughing x 3 weeks. Chest congestion, productive sputum.   Denies any new edema.  Coughing day and night. Fever started the first week and then started to go away for week and returned this week. Temperature was 97.4 this morning temporal.   She is chronically on 4 Liters of oxygen nasal cannula day and night. She lost her pulse oximetry. Heart rate 98 and oxygen saturation 96 %.   Denies chest pain.  Chest tightness with congestion.  History of bronchitis, no historfy of pneumonia. No ill contacts. Covid test was negative.  Patient  denies any fever, body aches,chills, rash, chest pain, shortness of breath, nausea, vomiting, or diarrhea.   Observations/Objective:  Video Visit.   Patient is alert and oriented and responsive to questions Engages in conversation with provider. Speaks in full sentences without any pauses without any shortness of breath or distress.   Assessment/ Plan  1. COPD exacerbation (HCC)  - amoxicillin-clavulanate (AUGMENTIN) 875-125 MG tablet; Take 1 tablet by mouth 2 (two) times daily. With food  Dispense: 20 tablet; Refill: 0 - predniSONE (STERAPRED UNI-PAK 21 TAB) 10 MG (21) TBPK tablet; PO: Take 6 tablets on day 1:Take 5 tablets day 2:Take 4 tablets day 3: Take 3 tablets day 4:Take 2 tablets day  five: 5 Take 1 tablet day 6  Dispense: 21 tablet; Refill: 0 - benzonatate (TESSALON) 100 MG capsule; Take 1 capsule (100 mg total) by mouth 2 (two) times daily as needed for cough.  Dispense: 20 capsule; Refill: 0  2. Bacterial upper respiratory infection - amoxicillin-clavulanate (AUGMENTIN) 875-125 MG tablet; Take 1 tablet by mouth 2 (two) times daily. With food  Dispense: 20 tablet; Refill: 0 - predniSONE (STERAPRED UNI-PAK 21 TAB) 10 MG (21) TBPK tablet; PO: Take 6 tablets on day 1:Take 5 tablets day 2:Take 4 tablets day 3: Take 3 tablets day 4:Take 2 tablets day five: 5 Take 1 tablet day 6  Dispense: 21 tablet; Refill: 0 - benzonatate (TESSALON) 100 MG capsule; Take 1 capsule (100 mg total) by mouth 2 (two) times daily as needed for cough.  Dispense: 20 capsule; Refill: 0  Declined recommended x ray and labs at [facility].  Covid test advised. Declined at this time.  Follow Up Instructions:  In person evaluation advised immediately in the emergency room/ urgent care if not improving at anytime.   Advised in person evaluation at anytime is advised if any symptoms do not improve, worsen or change at any given time.  Red Flags discussed. The patient was given clear instructions to go to ER or return to medical center if any red flags develop, symptoms do not improve, worsen or new problems develop. They verbalized understanding.   I discussed the assessment and treatment plan with the patient. The patient was provided an opportunity to ask questions and all were answered. The patient  agreed with the plan and demonstrated an understanding of the instructions.   The patient was advised to call back or seek an in-person evaluation if the symptoms worsen or if the condition fails to improve as anticipated.    Jairo Ben, FNP

## 2021-02-07 DIAGNOSIS — B9689 Other specified bacterial agents as the cause of diseases classified elsewhere: Secondary | ICD-10-CM | POA: Insufficient documentation

## 2021-02-12 ENCOUNTER — Telehealth: Payer: Self-pay

## 2021-02-12 NOTE — Telephone Encounter (Signed)
When I called pt to let her know that medications were here and ready for pick up she stated that she has a yeast infection. Pt has recently finished a round of antibiotics for a COPD exacerbation prescribed by Marcelino Duster, NP on 02/05/2021.

## 2021-02-12 NOTE — Telephone Encounter (Signed)
Received pt assistance medications. Pt is aware they are ready for pick up.   Ozempic: 4 boxes Novolog: 4 boxes Pen needles: 4 boxes

## 2021-02-12 NOTE — Telephone Encounter (Signed)
error 

## 2021-02-13 ENCOUNTER — Other Ambulatory Visit: Payer: Self-pay | Admitting: Adult Health

## 2021-02-13 DIAGNOSIS — T3695XA Adverse effect of unspecified systemic antibiotic, initial encounter: Secondary | ICD-10-CM

## 2021-02-13 DIAGNOSIS — B379 Candidiasis, unspecified: Secondary | ICD-10-CM

## 2021-02-13 MED ORDER — FLUCONAZOLE 150 MG PO TABS: 150.0000 mg | ORAL_TABLET | Freq: Once | ORAL | 0 refills | Status: AC

## 2021-02-13 NOTE — Telephone Encounter (Signed)
Spoke with pt to let her know that the medication has been sent in.

## 2021-02-13 NOTE — Telephone Encounter (Signed)
Meds ordered this encounter  Medications   fluconazole (DIFLUCAN) 150 MG tablet    Sig: Take 1 tablet (150 mg total) by mouth once for 1 dose.    Dispense:  1 tablet    Refill:  0  Sent diflucan follow up if any symptoms persist.  Dx Antibiotic induced yeast infection.

## 2021-02-13 NOTE — Progress Notes (Signed)
Meds ordered this encounter  Medications   fluconazole (DIFLUCAN) 150 MG tablet    Sig: Take 1 tablet (150 mg total) by mouth once for 1 dose.    Dispense:  1 tablet    Refill:  0    

## 2021-02-14 NOTE — Telephone Encounter (Signed)
Pt's son has picked up medication  

## 2021-02-21 ENCOUNTER — Telehealth: Payer: Self-pay

## 2021-02-21 ENCOUNTER — Ambulatory Visit: Payer: Medicare HMO

## 2021-02-21 NOTE — Telephone Encounter (Signed)
Unsuccessful attempt to reach patient for scheduled awv on preferred number. Unable to leave voice mail. Reschedule as appropriate.

## 2021-02-22 DIAGNOSIS — E1165 Type 2 diabetes mellitus with hyperglycemia: Secondary | ICD-10-CM | POA: Diagnosis not present

## 2021-02-23 DIAGNOSIS — J449 Chronic obstructive pulmonary disease, unspecified: Secondary | ICD-10-CM | POA: Diagnosis not present

## 2021-02-23 DIAGNOSIS — G839 Paralytic syndrome, unspecified: Secondary | ICD-10-CM | POA: Diagnosis not present

## 2021-02-27 ENCOUNTER — Other Ambulatory Visit: Payer: Self-pay | Admitting: Internal Medicine

## 2021-03-06 ENCOUNTER — Other Ambulatory Visit: Payer: Self-pay | Admitting: Internal Medicine

## 2021-03-13 ENCOUNTER — Ambulatory Visit: Payer: Medicare HMO | Admitting: Pharmacist

## 2021-03-13 DIAGNOSIS — I10 Essential (primary) hypertension: Secondary | ICD-10-CM

## 2021-03-13 DIAGNOSIS — IMO0002 Reserved for concepts with insufficient information to code with codable children: Secondary | ICD-10-CM

## 2021-03-13 DIAGNOSIS — J432 Centrilobular emphysema: Secondary | ICD-10-CM

## 2021-03-13 DIAGNOSIS — I2510 Atherosclerotic heart disease of native coronary artery without angina pectoris: Secondary | ICD-10-CM

## 2021-03-13 NOTE — Chronic Care Management (AMB) (Signed)
Chronic Care Management Pharmacy Note  03/13/2021 Name:  Catherine [name] MRN:  [id number] DOB:  01-03-1956   Subjective: Catherine [name] is an 65 y.o. year old female who is a primary patient of Derrel Nip, Aris Everts, MD.  The CCM team was consulted for assistance with disease management and care coordination needs.    Care coordination  for  device access  in response to provider referral for pharmacy case management and/or care coordination services.   Consent to Services:  The patient was given information about Chronic Care Management services, agreed to services, and gave verbal consent prior to initiation of services.  Please see initial visit note for detailed documentation.   Patient Care Team: Crecencio Mc, MD as PCP - General (Internal Medicine) De Hollingshead, RPH-CPP as Pharmacist (Pharmacist)  Objective:  Lab Results  Component Value Date   CREATININE 0.84 12/13/2020   CREATININE 0.78 06/26/2020   CREATININE 0.72 01/19/2020    Lab Results  Component Value Date   HGBA1C 7.0 (H) 12/13/2020   Last diabetic Eye exam:  Lab Results  Component Value Date/Time   HMDIABEYEEXA No Retinopathy 01/30/2016 08:09 AM    Last diabetic Foot exam:  Lab Results  Component Value Date/Time   HMDIABFOOTEX normal 10/07/2013 12:00 AM        Component Value Date/Time   CHOL 112 12/13/2020 1203   CHOL 185 11/11/2011 0914   TRIG 136.0 12/13/2020 1203   TRIG 166 11/11/2011 0914   HDL 39.30 12/13/2020 1203   HDL 47 11/11/2011 0914   CHOLHDL 3 12/13/2020 1203   VLDL 27.2 12/13/2020 1203   VLDL 33 11/11/2011 0914   LDLCALC 45 12/13/2020 1203   LDLCALC 73 12/10/2018 1447   LDLCALC 105 (H) 11/11/2011 0914   LDLDIRECT 74.0 06/26/2020 1441    Hepatic Function Latest Ref Rng & Units 12/13/2020 06/26/2020 01/12/2020  Total Protein 6.0 - 8.3 g/dL 6.9 6.9 6.7  Albumin 3.5 - 5.2 g/dL 4.0 4.0 3.7  AST 0 - 37 U/L 28 35 36  ALT 0 - 35 U/L '18 19 25  ' Alk Phosphatase 39 - 117 U/L  87 95 95  Total Bilirubin 0.2 - 1.2 mg/dL 0.5 0.3 0.4  Bilirubin, Direct 0.0 - 0.3 mg/dL - - -    Lab Results  Component Value Date/Time   TSH 1.10 01/12/2020 11:36 AM   TSH 1.05 12/10/2018 02:47 PM    CBC Latest Ref Rng & Units 01/19/2020 01/16/2020 01/08/2020  WBC 4.0 - 10.5 K/uL 10.3 9.9 12.8(H)  Hemoglobin 12.0 - 15.0 g/dL 13.6 13.5 12.2  Hematocrit 36.0 - 46.0 % 41.8 40.7 38.0  Platelets 150 - 400 K/uL 293 316 267    Lab Results  Component Value Date/Time   VD25OH 37 12/10/2018 02:47 PM   VD25OH 31.92 03/23/2018 04:40 PM   VD25OH 31 08/08/2016 04:44 PM   VD25OH 3.48 (L) 04/21/2016 10:20 AM    Clinical ASCVD: No  The ASCVD Risk score Mikey Bussing DC Jr., et al., 2013) failed to calculate for the following reasons:   The valid total cholesterol range is 130 to 320 mg/dL      Social History   Tobacco Use  Smoking Status Former   Packs/day: 1.00   Years: 40.00   Pack years: 40.00   Types: Cigarettes   Quit date: 06/27/2011   Years since quitting: 9.7  Smokeless Tobacco Never   BP Readings from Last 3 Encounters:  12/13/20 (!) 162/96  08/15/20 (!) 154/78  06/26/20 Marland Kitchen)  164/84   Pulse Readings from Last 3 Encounters:  12/13/20 95  08/15/20 91  06/26/20 (!) 120   Wt Readings from Last 3 Encounters:  02/05/21 264 lb (119.7 kg)  12/13/20 263 lb 6.4 oz (119.5 kg)  06/26/20 264 lb 6.4 oz (119.9 kg)    Assessment: Review of patient past medical history, allergies, medications, health status, including review of consultants reports, laboratory and other test data, was performed as part of comprehensive evaluation and provision of chronic care management services.   SDOH:  (Social Determinants of Health) assessments and interventions performed:    CCM Care Plan  Allergies  Allergen Reactions   Sulfa Antibiotics Swelling    Patient reports caused cellulitis Other reaction(s): Other (See Comments) cellulitis   Hydroxychloroquine     Other reaction(s): Other (See  Comments) Gi upset   Oxycodone-Acetaminophen    Percocet [Oxycodone-Acetaminophen]    Vicodin [Hydrocodone-Acetaminophen]    Aspirin Other (See Comments)    Other reaction(s): Blood Disorder   Ibuprofen     Other reaction(s): Blood Disorder   Omnipaque [Iohexol] Itching    Patient states she has had IV contrast multiple times without any reaction. But she said on Jan 07, 2020 after injection she had terrible itching.  They gave her Benadryl which stopped it immediately.  Told Dr. Alfred Levins this and she elected to give 50 mg IV Benadryl before scan today 01-19-2020.  She had no reaction after injection today. It wasn't documented on 01-07-2020.    Medications Reviewed Today     Reviewed by Doreen Beam, FNP (Family Nurse Practitioner) on 02/05/21 at 1634  Med List Status: <None>   Medication Order Taking? Sig Documenting Provider Last Dose Status Informant  acetaminophen (TYLENOL) 325 MG tablet [id number] Yes Take 650 mg by mouth every 6 (six) hours as needed. PRN headache [provider] Taking Active Self  albuterol (VENTOLIN HFA) 108 (90 Base) MCG/ACT inhaler [id number] Yes INHALE 1-2 PUFFS BY MOUTH EVERY 6 HOURS AS NEEDED FOR WHEEZE OR SHORTNESS OF BREATH Crecencio Mc, MD Taking Active   amoxicillin-clavulanate (AUGMENTIN) 875-125 MG tablet [id number]  Take 1 tablet by mouth 2 (two) times daily. With food Doreen Beam, FNP  Active   aspirin 81 MG EC tablet [id number] Yes Take 81 mg by mouth daily. [provider] Taking Active Self  benzonatate (TESSALON) 100 MG capsule [id number] Yes Take 1 capsule (100 mg total) by mouth 2 (two) times daily as needed for cough. Flinchum, Kelby Aline, FNP  Active   Budeson-Glycopyrrol-Formoterol (BREZTRI AEROSPHERE) 160-9-4.8 MCG/ACT Hollie Salk [id number] Yes Inhale 2 puffs into the lungs 2 (two) times daily. Crecencio Mc, MD Taking Active   Cholecalciferol (VITAMIN D3) 125 MCG (5000 UT) CAPS [id number] Yes Take 1,000 Units by mouth  daily.  [provider] Taking Active Self  insulin aspart (NOVOLOG) 100 UNIT/ML injection [id number] Yes Inject up to 15 units TID with meals Crecencio Mc, MD Taking Active   insulin degludec (TRESIBA FLEXTOUCH) 100 UNIT/ML FlexTouch Pen [id number] Yes Inject 0.3 mLs (30 Units total) into the skin daily. Crecencio Mc, MD Taking Active            Med Note [name] May 15, 2020  3:47 PM)    ipratropium-albuterol (DUONEB) 0.5-2.5 (3) MG/3ML SOLN [id number] Yes USE 1 VIAL VIA NEBULIZER EVERY 6 HOURS AS NEEDED Crecencio Mc, MD Taking Active   Lactobacillus (PROBIOTIC ACIDOPHILUS PO) [id number] Yes Take 1 capsule by mouth daily.  [provider] Taking Active Self  lactulose (CHRONULAC) 10 GM/15ML solution [id number] Yes TAKE 30 ML EVERY 4 HOURS UNTIL CONSTIPATION IS RELIEVED Crecencio Mc, MD Taking Active   lisinopril (ZESTRIL) 20 MG tablet [id number] Yes TAKE 1 TABLET BY MOUTH EVERY DAY Crecencio Mc, MD Taking Active   magnesium oxide (MAG-OX) 400 MG tablet [id number] Yes Take 400 mg by mouth every other day. [provider] Taking Active Self  Melatonin 5 MG TABS [id number] Yes Take 5 mg by mouth at bedtime. [provider] Taking Active Self  metFORMIN (GLUCOPHAGE) 1000 MG tablet [id number] Yes Take 1 tablet (1,000 mg total) by mouth 2 (two) times daily with a meal. Crecencio Mc, MD Taking Active   metoprolol succinate (TOPROL-XL) 25 MG 24 hr tablet [id number] Yes TAKE 1 TABLET BY MOUTH EVERY DAY Crecencio Mc, MD Taking Active   nystatin (MYCOSTATIN/NYSTOP) powder [id number] Yes Apply 1 application topically 2 (two) times daily. To rash under breasts until resolved. Crecencio Mc, MD Taking Active   ondansetron (ZOFRAN ODT) 4 MG disintegrating tablet [id number] Yes Take 1 tablet (4 mg total) by mouth every 8 (eight) hours as needed for nausea or vomiting. Crecencio Mc, MD Taking Active   PARoxetine (PAXIL) 10 MG tablet [id number] Yes TAKE  1 TABLET(10 MG) BY MOUTH EVERY MORNING Crecencio Mc, MD Taking Active   PARoxetine (PAXIL) 20 MG tablet [id number] Yes TAKE 1 TABLET(10 MG) BY MOUTH EVERY MORNING Crecencio Mc, MD Taking Active   Potassium 99 MG TABS [id number] Yes Take 99 mg by mouth daily. Reported on 09/20/2015 [provider] Taking Active Self           Med Note (CAULFIELD, ASHLEY L   Thu Dec 06, 2015 10:41 AM)    predniSONE (STERAPRED UNI-PAK 21 TAB) 10 MG (21) TBPK tablet [id number] Yes PO: Take 6 tablets on day 1:Take 5 tablets day 2:Take 4 tablets day 3: Take 3 tablets day 4:Take 2 tablets day five: 5 Take 1 tablet day 6 Flinchum, Kelby Aline, FNP  Active   promethazine (PHENERGAN) 12.5 MG tablet [id number] Yes TAKE 1 TABLET (12.5 MG TOTAL) BY MOUTH AT BEDTIME AS NEEDED FOR NAUSEA OR VOMITING. Crecencio Mc, MD Taking Active   rosuvastatin (CRESTOR) 40 MG tablet [id number] Yes TAKE 1 TABLET BY MOUTH EVERY DAY Crecencio Mc, MD Taking Active   Semaglutide, 1 MG/DOSE, (OZEMPIC, 1 MG/DOSE,) 4 MG/3ML SOPN [id number] Yes Inject 1 mg into the skin once a week. 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   B12 deficiency 06/30/2020   Heart palpitations 01/19/2020   Palpitations 01/19/2020   Obstipation 01/14/2020   Hospital discharge follow-up 01/14/2020   Colitis 01/07/2020   Abdominal pain 01/07/2020   Educated about COVID-19 virus infection 02/20/2019   Microalbuminuria due to type 2 diabetes mellitus ([place]) 03/25/2018   Bilateral chronic knee pain 12/20/2016   Nausea in adult 12/20/2016   Other specified nonscarring hair loss 12/20/2016   Boil, breast 08/08/2016   Vitamin D deficiency 04/22/2016   Tachycardia 11/21/2014   Encounter for preventive health examination 09/26/2014   Neck pain of over 3 months duration 09/26/2014    Inflammatory arthritis 06/13/2014   Elevated alkaline phosphatase measurement 10/09/2013   Leg swelling 04/05/2013  OSA (obstructive sleep apnea) 03/01/2013   Allergic rhinitis 02/04/2013   Pulmonary nodule 09/15/2012   Chronic hypoxemic respiratory failure ([place]) 05/03/2012   COPD (chronic obstructive pulmonary disease) ([place]) 04/28/2012   Diabetes mellitus type 2, uncontrolled, with complications ([place]) 24/58/0998   COPD exacerbation ([place]) 08/10/2011   GERD (gastroesophageal reflux disease)    Obesity (BMI 30-39.9)    Major depressive disorder with current active episode    Anxiety    Tobacco abuse, in remission    Screening for colon cancer    Screening for breast cancer    CAD (coronary artery disease) 12/02/2010   HTN (hypertension) 12/02/2010   Hyperlipidemia 12/02/2010    Immunization History  Administered Date(s) Administered   Fluad Quad(high Dose 65+) 06/26/2020   Hepatitis B 01/21/2012   Influenza Whole 07/10/2011, 08/31/2012   Influenza,inj,Quad PF,6+ Mos 06/12/2014, 10/06/2016, 09/16/2018   Pneumococcal Conjugate-13 06/12/2014   Pneumococcal Polysaccharide-23 12/08/2011   Tdap 12/22/2005    Conditions to be addressed/monitored: COPD and DMII  Care Plan : Medication Management  Updates made by De Hollingshead, RPH-CPP since 03/13/2021 12:00 AM     Problem: Diabetes, COPD, HTN      Long-Range Goal: Disease Progression Prevention   This Visit's Progress: On track  Recent Progress: On track  Priority: High  Note:   Current Barriers:  Unable to independently afford treatment regimen Unable to achieve control of diabetes   Pharmacist Clinical Goal(s):  Over the next 90 days, patient will verbalize ability to afford treatment regimen. Over the next 90 days, patient will achieve control of diabetes as evidenced by improvement in A1c through collaboration with PharmD and provider.   Interventions: 1:1 collaboration with Crecencio Mc, MD regarding  development and update of comprehensive plan of care as evidenced by provider attestation and co-signature Inter-disciplinary care team collaboration (see longitudinal plan of care) Comprehensive medication review performed; medication list updated in electronic medical record  Health Maintenance: Eye exam - due, previously discussed that patient needs to schedule Mammogram - previously reported that she and Dr. Derrel Nip discussed deferring given recent cold symptoms/pulmonary infection Colonoscopy - previously reported that she is not a candidate for anesthesia COVID vaccinations - previously reported that she and Dr. Derrel Nip discussed pursuing sometime in May, will follow up moving forward Pneumonia - recommend PCV20. Recommend prioritizing COVID vaccination Tdap - recommend getting at local pharmacy. Recommend prioritizing COVID vaccination  Diabetes: A1c goal A1c 7%; current treatment: metformin 1000 mg BID, Ozempic 1 mg weekly, Tresiba 30 units daily, Novolog 14-15 units with meals  No hx SGLT2 per chart review. Will consider moving forward for renal protection Approved for Joni Reining, Novolog assistance through 2022 Completed [place] 2 sensor reorder through [employer] to [employer]. Will follow.   Hypertension, Impaired Diastolic Function (LVEF 33-82%): Elevated at last office visit, has not been checking as consistently at home; current treatment: lisinopril 20 mg daily, metoprolol succinate 25 mg daily, potassium 99 mg daily; follows w/ Dr. Laurelyn Sickle Could consider SGLT2 moving forward for renal protection Previously recommended to continue current regimen at this time. Encouraged periodic home BP checks.   Hyperlipidemia and ASCVD Risk Reduction Controlled per last lipid panel; current treatment: rosuvastatin 40 mg daily   Antiplatelet regimen: aspirin 81 mg daily  Previously recommended to continue current regimen at this time  Chronic Obstructive Pulmonary  Disease: Controlled; current treatment: Breztri 2 puffs BID, albuterol nebulizer ~3 times daily or albuterol HFA if she is out; reports more frequent  use over the past several weeks due to cold.  Approved for Breztri assistance through 09/07/21.  Previously recommended to continue current regimen at this time. Continue weight loss efforts to improve breathing  Depression/Anxiety: Moderately well controlled at this time; current regimen: paroxetine 20 mg daily - has not increased yet, plans to start next week Previously recommended to continue plan to increase paroxetine per PCP.   Constipation: Managed on current regimen. Lactulose ~Q3 days if she has not had a bowel movement Previously recommended to continue current regimen with focus on hydration, fiber. Constipation may limit use of higher Ozempic doses. Will follow.   Patient Goals/Self-Care Activities Over the next 90 days, patient will:  - take medications as prescribed check blood glucose using CGM three times daily, document, and provide at future appointments check blood pressure weekly, document, and provide at future appointments collaborate with provider on medication access solutions  Follow Up Plan: Telephone follow up appointment with care management team member scheduled for:~ 8 weeks as previously scheduled      Medication Assistance: Joni Reining, Novolog obtained through [employer] medication assistance program.  Enrollment ends 08/07/21. Breztri  obtained through [employer] medication assistance program.  Enrollment ends 09/07/21  Patient's preferred pharmacy is:  [id number] [place], [id number]: [phone]: [phone]  Follow Up:  Patient agrees to Care Plan and Follow-up.  Plan: Telephone follow up appointment with care management team member scheduled for:  ~ 8 weeks  Catie TDarnelle Maffucci PharmD, BCordele CNorth CorbinClinical Pharmacist LPhelps [employer]at [phone]

## 2021-03-13 NOTE — Patient Instructions (Signed)
Visit Information  PATIENT GOALS:  Goals Addressed               This Visit's Progress     Patient Stated     Medication Management (pt-stated)        Patient Goals/Self-Care Activities Over the next 90 days, patient will:  - take medications as prescribed check blood glucose using CGM three times daily, document, and provide at future appointments check blood pressure weekly, document, and provide at future appointments collaborate with provider on medication access solutions          Patient verbalizes understanding of instructions provided today and agrees to view in MyChart.   Plan: Telephone follow up appointment with care management team member scheduled for:  ~ 8 weeks  [name], PharmD, [place], CPP Clinical Pharmacist [employer] at [employer] [phone]

## 2021-03-14 ENCOUNTER — Other Ambulatory Visit: Payer: Self-pay | Admitting: Internal Medicine

## 2021-03-15 ENCOUNTER — Telehealth: Payer: Self-pay

## 2021-03-15 NOTE — Telephone Encounter (Signed)
Attempted to call pt. No answer no voicemail. Need to let pt know that her pt assistance medication has been received and ready to be picked up.   Catherine [name]: 2 boxes

## 2021-03-21 ENCOUNTER — Ambulatory Visit (INDEPENDENT_AMBULATORY_CARE_PROVIDER_SITE_OTHER): Payer: Medicare HMO | Admitting: Pharmacist

## 2021-03-21 DIAGNOSIS — E118 Type 2 diabetes mellitus with unspecified complications: Secondary | ICD-10-CM

## 2021-03-21 DIAGNOSIS — E1165 Type 2 diabetes mellitus with hyperglycemia: Secondary | ICD-10-CM

## 2021-03-21 DIAGNOSIS — R809 Proteinuria, unspecified: Secondary | ICD-10-CM | POA: Diagnosis not present

## 2021-03-21 DIAGNOSIS — I2510 Atherosclerotic heart disease of native coronary artery without angina pectoris: Secondary | ICD-10-CM

## 2021-03-21 DIAGNOSIS — IMO0002 Reserved for concepts with insufficient information to code with codable children: Secondary | ICD-10-CM

## 2021-03-21 DIAGNOSIS — I10 Essential (primary) hypertension: Secondary | ICD-10-CM

## 2021-03-21 DIAGNOSIS — J432 Centrilobular emphysema: Secondary | ICD-10-CM

## 2021-03-21 DIAGNOSIS — E1129 Type 2 diabetes mellitus with other diabetic kidney complication: Secondary | ICD-10-CM

## 2021-03-21 NOTE — Patient Instructions (Signed)
Visit Information  PATIENT GOALS:  Goals Addressed               This Visit's Progress     Patient Stated     Medication Management (pt-stated)        Patient Goals/Self-Care Activities Over the next 90 days, patient will:  - take medications as prescribed check blood glucose using CGM three times daily, document, and provide at future appointments check blood pressure weekly, document, and provide at future appointments collaborate with provider on medication access solutions          Patient verbalizes understanding of instructions provided today and agrees to view in MyChart.   Plan: Telephone follow up appointment with care management team member scheduled for:  ~ 8 weeks  Catie Devesh [place]e, PharmD, BCACP, CPP Clinical Pharmacist [facility] at  Station [phone] 

## 2021-03-21 NOTE — Chronic Care Management (AMB) (Signed)
Chronic Care Management Pharmacy Note  03/21/2021 Name:  Catherine [name] MRN:  [id number] DOB:  02/23/1956   Subjective: Catherine [name] is an 65 y.o. year old female who is a primary patient of Derrel Nip, Aris Everts, MD.  The CCM team was consulted for assistance with disease management and care coordination needs.  She didn't feel up to talking for very long today  Engaged with patient by telephone for follow up visit in response to provider referral for pharmacy case management and/or care coordination services.   Consent to Services:  The patient was given information about Chronic Care Management services, agreed to services, and gave verbal consent prior to initiation of services.  Please see initial visit note for detailed documentation.   Patient Care Team: Crecencio Mc, MD as PCP - General (Internal Medicine) De Hollingshead, RPH-CPP as Pharmacist (Pharmacist)   Objective:  Lab Results  Component Value Date   CREATININE 0.84 12/13/2020   CREATININE 0.78 06/26/2020   CREATININE 0.72 01/19/2020    Lab Results  Component Value Date   HGBA1C 7.0 (H) 12/13/2020   Last diabetic Eye exam:  Lab Results  Component Value Date/Time   HMDIABEYEEXA No Retinopathy 01/30/2016 08:09 AM    Last diabetic Foot exam:  Lab Results  Component Value Date/Time   HMDIABFOOTEX normal 10/07/2013 12:00 AM        Component Value Date/Time   CHOL 112 12/13/2020 1203   CHOL 185 11/11/2011 0914   TRIG 136.0 12/13/2020 1203   TRIG 166 11/11/2011 0914   HDL 39.30 12/13/2020 1203   HDL 47 11/11/2011 0914   CHOLHDL 3 12/13/2020 1203   VLDL 27.2 12/13/2020 1203   VLDL 33 11/11/2011 0914   LDLCALC 45 12/13/2020 1203   LDLCALC 73 12/10/2018 1447   LDLCALC 105 (H) 11/11/2011 0914   LDLDIRECT 74.0 06/26/2020 1441    Hepatic Function Latest Ref Rng & Units 12/13/2020 06/26/2020 01/12/2020  Total Protein 6.0 - 8.3 g/dL 6.9 6.9 6.7  Albumin 3.5 - 5.2 g/dL 4.0 4.0 3.7  AST 0 - 37 U/L  28 35 36  ALT 0 - 35 U/L '18 19 25  ' Alk Phosphatase 39 - 117 U/L 87 95 95  Total Bilirubin 0.2 - 1.2 mg/dL 0.5 0.3 0.4  Bilirubin, Direct 0.0 - 0.3 mg/dL - - -    Lab Results  Component Value Date/Time   TSH 1.10 01/12/2020 11:36 AM   TSH 1.05 12/10/2018 02:47 PM    CBC Latest Ref Rng & Units 01/19/2020 01/16/2020 01/08/2020  WBC 4.0 - 10.5 K/uL 10.3 9.9 12.8(H)  Hemoglobin 12.0 - 15.0 g/dL 13.6 13.5 12.2  Hematocrit 36.0 - 46.0 % 41.8 40.7 38.0  Platelets 150 - 400 K/uL 293 316 267    Lab Results  Component Value Date/Time   VD25OH 37 12/10/2018 02:47 PM   VD25OH 31.92 03/23/2018 04:40 PM   VD25OH 31 08/08/2016 04:44 PM   VD25OH 3.48 (L) 04/21/2016 10:20 AM    Clinical ASCVD: No  The ASCVD Risk score Mikey Bussing DC Jr., et al., 2013) failed to calculate for the following reasons:   The valid total cholesterol range is 130 to 320 mg/dL      Social History   Tobacco Use  Smoking Status Former   Packs/day: 1.00   Years: 40.00   Pack years: 40.00   Types: Cigarettes   Quit date: 06/27/2011   Years since quitting: 9.7  Smokeless Tobacco Never   BP Readings from Last 3  Encounters:  12/13/20 (!) 162/96  08/15/20 (!) 154/78  06/26/20 (!) 164/84   Pulse Readings from Last 3 Encounters:  12/13/20 95  08/15/20 91  06/26/20 (!) 120   Wt Readings from Last 3 Encounters:  02/05/21 264 lb (119.7 kg)  12/13/20 263 lb 6.4 oz (119.5 kg)  06/26/20 264 lb 6.4 oz (119.9 kg)    Assessment: Review of patient past medical history, allergies, medications, health status, including review of consultants reports, laboratory and other test data, was performed as part of comprehensive evaluation and provision of chronic care management services.   SDOH:  (Social Determinants of Health) assessments and interventions performed:    CCM Care Plan  Allergies  Allergen Reactions   Sulfa Antibiotics Swelling    Patient reports caused cellulitis Other reaction(s): Other (See  Comments) cellulitis   Hydroxychloroquine     Other reaction(s): Other (See Comments) Gi upset   Oxycodone-Acetaminophen    Percocet [Oxycodone-Acetaminophen]    Vicodin [Hydrocodone-Acetaminophen]    Aspirin Other (See Comments)    Other reaction(s): Blood Disorder   Ibuprofen     Other reaction(s): Blood Disorder   Omnipaque [Iohexol] Itching    Patient states she has had IV contrast multiple times without any reaction. But she said on Jan 07, 2020 after injection she had terrible itching.  They gave her Benadryl which stopped it immediately.  Told Dr. Alfred Levins this and she elected to give 50 mg IV Benadryl before scan today 01-19-2020.  She had no reaction after injection today. It wasn't documented on 01-07-2020.    Medications Reviewed Today     Reviewed by Doreen Beam, FNP (Family Nurse Practitioner) on 02/05/21 at 1634  Med List Status: <None>   Medication Order Taking? Sig Documenting Provider Last Dose Status Informant  acetaminophen (TYLENOL) 325 MG tablet [id number] Yes Take 650 mg by mouth every 6 (six) hours as needed. PRN headache [provider] Taking Active Self  albuterol (VENTOLIN HFA) 108 (90 Base) MCG/ACT inhaler [id number] Yes INHALE 1-2 PUFFS BY MOUTH EVERY 6 HOURS AS NEEDED FOR WHEEZE OR SHORTNESS OF BREATH Crecencio Mc, MD Taking Active   amoxicillin-clavulanate (AUGMENTIN) 875-125 MG tablet [id number]  Take 1 tablet by mouth 2 (two) times daily. With food Doreen Beam, FNP  Active   aspirin 81 MG EC tablet [id number] Yes Take 81 mg by mouth daily. [provider] Taking Active Self  benzonatate (TESSALON) 100 MG capsule [id number] Yes Take 1 capsule (100 mg total) by mouth 2 (two) times daily as needed for cough. Flinchum, Kelby Aline, FNP  Active   Budeson-Glycopyrrol-Formoterol (BREZTRI AEROSPHERE) 160-9-4.8 MCG/ACT Hollie Salk [id number] Yes Inhale 2 puffs into the lungs 2 (two) times daily. Crecencio Mc, MD Taking Active   Cholecalciferol  (VITAMIN D3) 125 MCG (5000 UT) CAPS [id number] Yes Take 1,000 Units by mouth daily.  [provider] Taking Active Self  insulin aspart (NOVOLOG) 100 UNIT/ML injection [id number] Yes Inject up to 15 units TID with meals Crecencio Mc, MD Taking Active   insulin degludec (TRESIBA FLEXTOUCH) 100 UNIT/ML FlexTouch Pen [id number] Yes Inject 0.3 mLs (30 Units total) into the skin daily. Crecencio Mc, MD Taking Active            Med Note ([place] May 15, 2020  3:47 PM)    ipratropium-albuterol (DUONEB) 0.5-2.5 (3) MG/3ML SOLN [id number] Yes USE 1 VIAL VIA NEBULIZER EVERY 6 HOURS AS NEEDED Crecencio Mc, MD Taking Active  Lactobacillus (PROBIOTIC ACIDOPHILUS PO) [id number] Yes Take 1 capsule by mouth daily. [provider] Taking Active Self  lactulose (CHRONULAC) 10 GM/15ML solution [id number] Yes TAKE 30 ML EVERY 4 HOURS UNTIL CONSTIPATION IS RELIEVED Crecencio Mc, MD Taking Active   lisinopril (ZESTRIL) 20 MG tablet [id number] Yes TAKE 1 TABLET BY MOUTH EVERY DAY Crecencio Mc, MD Taking Active   magnesium oxide (MAG-OX) 400 MG tablet [id number] Yes Take 400 mg by mouth every other day. [provider] Taking Active Self  Melatonin 5 MG TABS [id number] Yes Take 5 mg by mouth at bedtime. [provider] Taking Active Self  metFORMIN (GLUCOPHAGE) 1000 MG tablet [id number] Yes Take 1 tablet (1,000 mg total) by mouth 2 (two) times daily with a meal. Crecencio Mc, MD Taking Active   metoprolol succinate (TOPROL-XL) 25 MG 24 hr tablet [id number] Yes TAKE 1 TABLET BY MOUTH EVERY DAY Crecencio Mc, MD Taking Active   nystatin (MYCOSTATIN/NYSTOP) powder [id number] Yes Apply 1 application topically 2 (two) times daily. To rash under breasts until resolved. Crecencio Mc, MD Taking Active   ondansetron (ZOFRAN ODT) 4 MG disintegrating tablet [id number] Yes Take 1 tablet (4 mg total) by mouth every 8 (eight) hours as needed for nausea or vomiting. Crecencio Mc, MD Taking Active   PARoxetine (PAXIL) 10 MG tablet [id number] Yes TAKE 1 TABLET(10 MG) BY MOUTH EVERY MORNING Crecencio Mc, MD Taking Active   PARoxetine (PAXIL) 20 MG tablet [id number] Yes TAKE 1 TABLET(10 MG) BY MOUTH EVERY MORNING Crecencio Mc, MD Taking Active   Potassium 99 MG TABS [id number] Yes Take 99 mg by mouth daily. Reported on 09/20/2015 [provider] Taking Active Self           Med Note (CAULFIELD, ASHLEY L   Thu Dec 06, 2015 10:41 AM)    predniSONE (STERAPRED UNI-PAK 21 TAB) 10 MG (21) TBPK tablet [id number] Yes PO: Take 6 tablets on day 1:Take 5 tablets day 2:Take 4 tablets day 3: Take 3 tablets day 4:Take 2 tablets day five: 5 Take 1 tablet day 6 Flinchum, Kelby Aline, FNP  Active   promethazine (PHENERGAN) 12.5 MG tablet [id number] Yes TAKE 1 TABLET (12.5 MG TOTAL) BY MOUTH AT BEDTIME AS NEEDED FOR NAUSEA OR VOMITING. Crecencio Mc, MD Taking Active   rosuvastatin (CRESTOR) 40 MG tablet [id number] Yes TAKE 1 TABLET BY MOUTH EVERY DAY Crecencio Mc, MD Taking Active   Semaglutide, 1 MG/DOSE, (OZEMPIC, 1 MG/DOSE,) 4 MG/3ML SOPN [id number] Yes Inject 1 mg into the skin once a week. 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   B12 deficiency 06/30/2020   Heart palpitations 01/19/2020   Palpitations 01/19/2020   Obstipation 01/14/2020   Hospital discharge follow-up 01/14/2020   Colitis 01/07/2020   Abdominal pain 01/07/2020   Educated about COVID-19 virus infection 02/20/2019   Microalbuminuria due to type 2 diabetes mellitus ([place]) 03/25/2018   Bilateral chronic knee pain 12/20/2016   Nausea in adult 12/20/2016   Other specified nonscarring hair loss 12/20/2016   Boil, breast 08/08/2016   Vitamin D deficiency 04/22/2016   Tachycardia 11/21/2014   Encounter for preventive  health examination 09/26/2014   Neck pain of over 3 months duration 09/26/2014   Inflammatory arthritis 06/13/2014  Elevated alkaline phosphatase measurement 10/09/2013   Leg swelling 04/05/2013   OSA (obstructive sleep apnea) 03/01/2013   Allergic rhinitis 02/04/2013   Pulmonary nodule 09/15/2012   Chronic hypoxemic respiratory failure ([place]) 05/03/2012   COPD (chronic obstructive pulmonary disease) ([place]) 04/28/2012   Diabetes mellitus type 2, uncontrolled, with complications ([place]) 66/02/3015   COPD exacerbation ([place]) 08/10/2011   GERD (gastroesophageal reflux disease)    Obesity (BMI 30-39.9)    Major depressive disorder with current active episode    Anxiety    Tobacco abuse, in remission    Screening for colon cancer    Screening for breast cancer    CAD (coronary artery disease) 12/02/2010   HTN (hypertension) 12/02/2010   Hyperlipidemia 12/02/2010    Immunization History  Administered Date(s) Administered   Fluad Quad(high Dose 65+) 06/26/2020   Hepatitis B 01/21/2012   Influenza Whole 07/10/2011, 08/31/2012   Influenza,inj,Quad PF,6+ Mos 06/12/2014, 10/06/2016, 09/16/2018   Pneumococcal Conjugate-13 06/12/2014   Pneumococcal Polysaccharide-23 12/08/2011   Tdap 12/22/2005    Conditions to be addressed/monitored: HTN, COPD, and DMII  Care Plan : Medication Management  Updates made by De Hollingshead, RPH-CPP since 03/21/2021 12:00 AM     Problem: Diabetes, COPD, HTN      Long-Range Goal: Disease Progression Prevention   This Visit's Progress: On track  Recent Progress: On track  Priority: High  Note:   Current Barriers:  Unable to independently afford treatment regimen Unable to achieve control of diabetes   Pharmacist Clinical Goal(s):  Over the next 90 days, patient will verbalize ability to afford treatment regimen. Over the next 90 days, patient will achieve control of diabetes as evidenced by improvement in A1c through collaboration with PharmD and  provider.   Interventions: 1:1 collaboration with Crecencio Mc, MD regarding development and update of comprehensive plan of care as evidenced by provider attestation and co-signature Inter-disciplinary care team collaboration (see longitudinal plan of care) Comprehensive medication review performed; medication list updated in electronic medical record  Health Maintenance: Eye exam - due, previously discussed that patient needs to schedule Mammogram - previously reported that she and Dr. Derrel Nip discussed deferring given recent cold symptoms/pulmonary infection Colonoscopy - previously reported that she is not a candidate for anesthesia COVID vaccinations - previously reported that she and Dr. Derrel Nip discussed pursuing sometime in May, will follow up moving forward Pneumonia - recommend PCV20. Recommend prioritizing COVID vaccination Tdap - recommend getting at local pharmacy. Recommend prioritizing COVID vaccination  Diabetes: A1c goal A1c 7%; current treatment: metformin 1000 mg BID, Ozempic 1 mg weekly, Tresiba 30 units daily, Novolog 14-15 units with meals  No hx SGLT2 per chart review. Will consider moving forward for renal protection Approved for Joni Reining, Novolog assistance through 2022 Little use of CGM recently due to feeling poorly Continue current regimen at this time  Hypertension, Impaired Diastolic Function (LVEF 01-09%): Elevated at last office visit, has not been checking as consistently at home; current treatment: lisinopril 20 mg daily, metoprolol succinate 25 mg daily, potassium 99 mg daily; follows w/ Dr. Laurelyn Sickle Could consider SGLT2 moving forward for renal protection Previously recommended to continue current regimen at this time. Encouraged periodic home BP checks.   Hyperlipidemia and ASCVD Risk Reduction Controlled per last lipid panel; current treatment: rosuvastatin 40 mg daily   Antiplatelet regimen: aspirin 81 mg daily  Previously recommended to  continue current regimen at this time  Chronic Obstructive Pulmonary Disease: Uncontrolled, reports more SOB recently; current treatment: Breztri 2  puffs BID - reports she has been out of Breztri for several weeks because she has not received any from [employer] assistance, albuterol nebulizer ~3 times daily or albuterol HFA if she is out;  Reports she attempted to call [place] [facility] but the connection was bad.  Approved for Breztri assistance through 09/07/21.  Provided Breztri sample to tide patient over until order arrives.  [place] patient assistance. Provided verbal refill. Requested they expedite refill, patient will call in a few hours to set up shipping.   Depression/Anxiety: Moderately well controlled at this time; current regimen: paroxetine 20 mg daily  Previously recommended to continue current regimen  Constipation: Managed on current regimen. Lactulose ~Q3 days if she has not had a bowel movement Previously recommended to continue current regimen with focus on hydration, fiber. Constipation may limit use of higher Ozempic doses. Will follow.   Patient Goals/Self-Care Activities Over the next 90 days, patient will:  - take medications as prescribed check blood glucose using CGM three times daily, document, and provide at future appointments check blood pressure weekly, document, and provide at future appointments collaborate with provider on medication access solutions  Follow Up Plan: Telephone follow up appointment with care management team member scheduled for:~ 8 weeks      Medication Assistance: Joni Reining, Novolog obtained through [employer] medication assistance program.  Enrollment ends 08/07/21. Breztri  obtained through [employer] medication assistance program.  Enrollment ends 09/07/21  Patient's preferred pharmacy is:  [id number] [place] [place]Phone: [phone]:  [phone] [id number] [place], [place] [id number]: [phone]: [phone]  Follow Up:  Patient agrees to Care Plan and Follow-up.  Plan: Telephone follow up appointment with care management team member scheduled for:  ~ 8 weeks  Catie TrDarnelle MaffucciPharmD, BCNewportCPMagnolialinical Pharmacist [phone]

## 2021-03-25 DIAGNOSIS — E1165 Type 2 diabetes mellitus with hyperglycemia: Secondary | ICD-10-CM | POA: Diagnosis not present

## 2021-03-25 DIAGNOSIS — G839 Paralytic syndrome, unspecified: Secondary | ICD-10-CM | POA: Diagnosis not present

## 2021-03-25 DIAGNOSIS — J449 Chronic obstructive pulmonary disease, unspecified: Secondary | ICD-10-CM | POA: Diagnosis not present

## 2021-04-05 ENCOUNTER — Telehealth: Payer: Self-pay | Admitting: Internal Medicine

## 2021-04-05 NOTE — Telephone Encounter (Signed)
I attempted to leave message for patient to call back and schedule Medicare Annual Wellness Visit (AWV) in office. Patient's doesn't have voice mail and no answer at home.  If not able to come in office, please offer to do virtually or by telephone.   Last AWV:02/21/2020  Please schedule at anytime with Nurse Health Advisor.

## 2021-04-06 ENCOUNTER — Other Ambulatory Visit: Payer: Self-pay | Admitting: Internal Medicine

## 2021-04-08 NOTE — Telephone Encounter (Signed)
Patient says she has been having Nausea and Vomiting since starting insulin says she has advised Pharm-D , requesting refill of Phenergan last OV PCP 01/02/21

## 2021-04-15 DIAGNOSIS — J439 Emphysema, unspecified: Secondary | ICD-10-CM | POA: Diagnosis not present

## 2021-04-25 DIAGNOSIS — E1165 Type 2 diabetes mellitus with hyperglycemia: Secondary | ICD-10-CM | POA: Diagnosis not present

## 2021-04-25 DIAGNOSIS — G839 Paralytic syndrome, unspecified: Secondary | ICD-10-CM | POA: Diagnosis not present

## 2021-04-25 DIAGNOSIS — J449 Chronic obstructive pulmonary disease, unspecified: Secondary | ICD-10-CM | POA: Diagnosis not present

## 2021-05-16 ENCOUNTER — Other Ambulatory Visit: Payer: Self-pay | Admitting: Internal Medicine

## 2021-05-16 DIAGNOSIS — IMO0002 Reserved for concepts with insufficient information to code with codable children: Secondary | ICD-10-CM

## 2021-05-16 DIAGNOSIS — E1165 Type 2 diabetes mellitus with hyperglycemia: Secondary | ICD-10-CM

## 2021-05-21 NOTE — Telephone Encounter (Signed)
Spoke with patient, informed that [place] patient assistance supply is here for pick up. She notes someone will come by today or tomorrow to pick up.

## 2021-05-22 NOTE — Telephone Encounter (Signed)
Patient's son picked up [place] patient assistance supply

## 2021-05-26 DIAGNOSIS — J449 Chronic obstructive pulmonary disease, unspecified: Secondary | ICD-10-CM | POA: Diagnosis not present

## 2021-05-26 DIAGNOSIS — G839 Paralytic syndrome, unspecified: Secondary | ICD-10-CM | POA: Diagnosis not present

## 2021-05-27 DIAGNOSIS — E1165 Type 2 diabetes mellitus with hyperglycemia: Secondary | ICD-10-CM | POA: Diagnosis not present

## 2021-05-29 ENCOUNTER — Telehealth: Payer: Self-pay | Admitting: Pharmacist

## 2021-05-29 NOTE — Telephone Encounter (Signed)
Received order [employer] patient assistance order for   Ozempic 4 mg/3 mL: 4 pens Novolog FlexPen U100, 5 x 3 mL pen box, 4 boxes NovoFine pen needles, 100 per box, 4 boxes.   Contacted patient, advised that medication was ready for pick up. She notes her son will come pick this up

## 2021-05-30 NOTE — Telephone Encounter (Signed)
Patient's son picked up medication supply.

## 2021-06-05 ENCOUNTER — Other Ambulatory Visit: Payer: Self-pay | Admitting: Internal Medicine

## 2021-06-19 ENCOUNTER — Ambulatory Visit: Payer: Medicare HMO

## 2021-06-25 DIAGNOSIS — J449 Chronic obstructive pulmonary disease, unspecified: Secondary | ICD-10-CM | POA: Diagnosis not present

## 2021-06-25 DIAGNOSIS — G839 Paralytic syndrome, unspecified: Secondary | ICD-10-CM | POA: Diagnosis not present

## 2021-06-26 DIAGNOSIS — E1165 Type 2 diabetes mellitus with hyperglycemia: Secondary | ICD-10-CM | POA: Diagnosis not present

## 2021-06-29 ENCOUNTER — Other Ambulatory Visit: Payer: Self-pay | Admitting: Internal Medicine

## 2021-07-03 ENCOUNTER — Other Ambulatory Visit: Payer: Self-pay | Admitting: Internal Medicine

## 2021-07-26 DIAGNOSIS — G839 Paralytic syndrome, unspecified: Secondary | ICD-10-CM | POA: Diagnosis not present

## 2021-07-26 DIAGNOSIS — J449 Chronic obstructive pulmonary disease, unspecified: Secondary | ICD-10-CM | POA: Diagnosis not present

## 2021-07-27 DIAGNOSIS — E1165 Type 2 diabetes mellitus with hyperglycemia: Secondary | ICD-10-CM | POA: Diagnosis not present

## 2021-07-31 ENCOUNTER — Ambulatory Visit: Payer: Medicare HMO

## 2021-08-07 ENCOUNTER — Other Ambulatory Visit: Payer: Self-pay | Admitting: Internal Medicine

## 2021-08-16 ENCOUNTER — Other Ambulatory Visit: Payer: Self-pay | Admitting: Internal Medicine

## 2021-08-25 DIAGNOSIS — J449 Chronic obstructive pulmonary disease, unspecified: Secondary | ICD-10-CM | POA: Diagnosis not present

## 2021-08-25 DIAGNOSIS — G839 Paralytic syndrome, unspecified: Secondary | ICD-10-CM | POA: Diagnosis not present

## 2021-08-26 DIAGNOSIS — E1165 Type 2 diabetes mellitus with hyperglycemia: Secondary | ICD-10-CM | POA: Diagnosis not present

## 2021-09-18 ENCOUNTER — Other Ambulatory Visit: Payer: Self-pay | Admitting: Internal Medicine

## 2021-09-24 ENCOUNTER — Ambulatory Visit: Payer: Medicare HMO

## 2021-09-25 DIAGNOSIS — J449 Chronic obstructive pulmonary disease, unspecified: Secondary | ICD-10-CM | POA: Diagnosis not present

## 2021-09-25 DIAGNOSIS — G839 Paralytic syndrome, unspecified: Secondary | ICD-10-CM | POA: Diagnosis not present

## 2021-09-30 ENCOUNTER — Other Ambulatory Visit: Payer: Self-pay | Admitting: Internal Medicine

## 2021-10-04 ENCOUNTER — Other Ambulatory Visit: Payer: Self-pay | Admitting: Internal Medicine

## 2021-10-04 DIAGNOSIS — J432 Centrilobular emphysema: Secondary | ICD-10-CM

## 2021-10-04 DIAGNOSIS — J9611 Chronic respiratory failure with hypoxia: Secondary | ICD-10-CM

## 2021-10-17 ENCOUNTER — Telehealth (INDEPENDENT_AMBULATORY_CARE_PROVIDER_SITE_OTHER): Payer: Medicare HMO | Admitting: Family Medicine

## 2021-10-17 DIAGNOSIS — B3731 Acute candidiasis of vulva and vagina: Secondary | ICD-10-CM | POA: Diagnosis not present

## 2021-10-17 DIAGNOSIS — U071 COVID-19: Secondary | ICD-10-CM

## 2021-10-17 MED ORDER — FLUCONAZOLE 150 MG PO TABS: 150.0000 mg | ORAL_TABLET | Freq: Every day | ORAL | 0 refills | Status: DC

## 2021-10-17 MED ORDER — DOXYCYCLINE HYCLATE 100 MG PO TABS: 100.0000 mg | ORAL_TABLET | Freq: Two times a day (BID) | ORAL | 0 refills | Status: DC

## 2021-10-17 NOTE — Patient Instructions (Addendum)
°  HOME CARE TIPS:  -I sent the medication(s) we discussed to your pharmacy: Meds ordered this encounter  Medications   doxycycline (VIBRA-TABS) 100 MG tablet    Sig: Take 1 tablet (100 mg total) by mouth 2 (two) times daily.    Dispense:  14 tablet    Refill:  0   fluconazole (DIFLUCAN) 150 MG tablet    Sig: Take 1 tablet (150 mg total) by mouth daily.    Dispense:  1 tablet    Refill:  0     -can use tylenol if needed for fevers, aches and pains per instructions  -can use nasal saline a few times per day if you have nasal congestion  -stay hydrated, drink plenty of fluids and eat small healthy meals - avoid dairy  -follow up with your doctor in 2-3 days unless improving and feeling better  -stay home while sick, except to seek medical care. If you have COVID19, you will likely be contagious for 7-10 days. Flu or Influenza is likely contagious for about 7 days. Other respiratory viral infections remain contagious for 5-10+ days depending on the virus and many other factors. Wear a good mask that fits snugly (such as N95 or KN95) if around others to reduce the risk of transmission.  It was nice to meet you today, and I really hope you are feeling better soon. I help [facility] out with telemedicine visits on Tuesdays and Thursdays and am happy to help if you need a follow up virtual visit on those days. Otherwise, if you have any concerns or questions following this visit please schedule a follow up visit with your Primary Care doctor or seek care at a local urgent care clinic to avoid delays in care.    Seek in person care or schedule a follow up video visit promptly if your symptoms worsen, new concerns arise or you are not improving with treatment. Call 911 and/or seek emergency care if your symptoms are severe or life threatening.

## 2021-10-17 NOTE — Progress Notes (Signed)
Virtual Visit via Video Note  I connected with Catherine [name]  on 10/17/21 at  3:00 PM EST by a video enabled telemedicine application and verified that I am speaking with the correct person using two identifiers.  Location patient: [place] Location provider:work or home office Persons participating in the virtual visit: patient, provider  I discussed the limitations and requested verbal permission for telemedicine visit. The patient expressed understanding and agreed to proceed.   HPI:  Acute telemedicine visit for : -Onset: about a week ago ago, tested positive for covid 4 days ago -son and husband had it last week too -Symptoms include:sore throat, feels hot - but no fever (temp 98.3), headache of and on, nasal congestion, upset stomach at times, cough - now today is seeing more green thick mucus -Denies: CP, SOB above or O2 requirement beyond normal, reports her breathing has been good - suprisingly better than usual per her report, vomiting, diarrhea -drinking fluids and able to get up down -Has tried:has not required increased albuterol during this illness -Pertinent past medical history: see below; had covid about 1 year ago -Pertinent medication allergies: Allergies  Allergen Reactions   Sulfa Antibiotics Swelling    Patient reports caused cellulitis Other reaction(s): Other (See Comments) cellulitis   Hydroxychloroquine     Other reaction(s): Other (See Comments) Gi upset   Oxycodone-Acetaminophen    Percocet [Oxycodone-Acetaminophen]    Vicodin [Hydrocodone-Acetaminophen]    Aspirin Other (See Comments)    Other reaction(s): Blood Disorder   Ibuprofen     Other reaction(s): Blood Disorder   Omnipaque [Iohexol] Itching    Patient states she has had IV contrast multiple times without any reaction. But she said on Jan 07, 2020 after injection she had terrible itching.  They gave her Benadryl which stopped it immediately.  Told Dr. Don Perking this and she elected to give 50 mg IV  Benadryl before scan today 01-19-2020.  She had no reaction after injection today. It wasn't documented on 01-07-2020.  -COVID-19 vaccine status: has not had any vaccines Immunization History  Administered Date(s) Administered   Fluad Quad(high Dose 65+) 06/26/2020   Hepatitis B 01/21/2012   Influenza Whole 07/10/2011, 08/31/2012   Influenza,inj,Quad PF,6+ Mos 06/12/2014, 10/06/2016, 09/16/2018   Pneumococcal Conjugate-13 06/12/2014   Pneumococcal Polysaccharide-23 12/08/2011   Tdap 12/22/2005     ROS: See pertinent positives and negatives per HPI.  Past Medical History:  Diagnosis Date   Anxiety    Asthma    COPD (chronic obstructive pulmonary disease) (HCC)    Depression    Diabetes mellitus    Gastritis and duodenitis June 2011   EGD   GERD (gastroesophageal reflux disease)    Hyperglycemia    Hyperlipidemia    Hypertension    Leukocytosis    Obesity (BMI 30-39.9)    S/P cardiac catheterization March 2012   normal coronaries,  due to chest pain Mariah Milling)   Screening for breast cancer Jul 28 2011   normal   Screening for colon cancer June 2011   polyps found, next one due 2014 ([facility])   Tobacco abuse, in remission    quit oct during hospitalization     Past Surgical History:  Procedure Laterality Date   ABDOMINAL HYSTERECTOMY     ABDOMINAL HYSTERECTOMY     BILATERAL OOPHORECTOMY  1995   and [facility].  noncancerous reasons   CARDIAC CATHETERIZATION  11/22/2010   no significant disease     Current Outpatient Medications:    doxycycline (VIBRA-TABS) 100 MG tablet,  Take 1 tablet (100 mg total) by mouth 2 (two) times daily., Disp: 14 tablet, Rfl: 0   fluconazole (DIFLUCAN) 150 MG tablet, Take 1 tablet (150 mg total) by mouth daily., Disp: 1 tablet, Rfl: 0   acetaminophen (TYLENOL) 325 MG tablet, Take 650 mg by mouth every 6 (six) hours as needed. PRN headache, Disp: , Rfl:    albuterol (VENTOLIN HFA) 108 (90 Base) MCG/ACT inhaler, INHALE 1-2 PUFFS BY MOUTH EVERY 6 HOURS  AS NEEDED FOR WHEEZE OR SHORTNESS OF BREATH, Disp: 8.5 each, Rfl: 3   aspirin 81 MG EC tablet, Take 81 mg by mouth daily., Disp: , Rfl:    benzonatate (TESSALON) 100 MG capsule, Take 1 capsule (100 mg total) by mouth 2 (two) times daily as needed for cough., Disp: 20 capsule, Rfl: 0   Budeson-Glycopyrrol-Formoterol (BREZTRI AEROSPHERE) 160-9-4.8 MCG/ACT AERO, Inhale 2 puffs into the lungs 2 (two) times daily., Disp: 32.1 g, Rfl: 3   Cholecalciferol (VITAMIN D3) 125 MCG (5000 UT) CAPS, Take 1,000 Units by mouth daily. , Disp: , Rfl:    insulin aspart (NOVOLOG) 100 UNIT/ML injection, Inject up to 15 units TID with meals, Disp: 15 mL, Rfl: 0   insulin degludec (TRESIBA FLEXTOUCH) 100 UNIT/ML FlexTouch Pen, Inject 0.3 mLs (30 Units total) into the skin daily., Disp: 36 mL, Rfl: 2   ipratropium-albuterol (DUONEB) 0.5-2.5 (3) MG/3ML SOLN, USE 1 VIAL BY NEBULIZER EVERY 6 HOURS AS NEEDED, Disp: 270 mL, Rfl: 2   Lactobacillus (PROBIOTIC ACIDOPHILUS PO), Take 1 capsule by mouth daily., Disp: , Rfl:    lactulose (CHRONULAC) 10 GM/15ML solution, TAKE 30 ML EVERY 4 HOURS UNTIL CONSTIPATION IS RELIEVED, Disp: 473 mL, Rfl: 1   lisinopril (ZESTRIL) 20 MG tablet, TAKE 1 TABLET BY MOUTH EVERY DAY, Disp: 90 tablet, Rfl: 1   magnesium oxide (MAG-OX) 400 MG tablet, Take 400 mg by mouth every other day., Disp: , Rfl:    Melatonin 5 MG TABS, Take 5 mg by mouth at bedtime., Disp: , Rfl:    metFORMIN (GLUCOPHAGE) 1000 MG tablet, TAKE 1 TABLET (1,000 MG TOTAL) BY MOUTH 2 (TWO) TIMES DAILY WITH A MEAL., Disp: 180 tablet, Rfl: 1   metoprolol succinate (TOPROL-XL) 25 MG 24 hr tablet, TAKE 1 TABLET BY MOUTH EVERY DAY, Disp: 90 tablet, Rfl: 3   nystatin (MYCOSTATIN/NYSTOP) powder, Apply 1 application topically 2 (two) times daily. To rash under breasts until resolved., Disp: 15 g, Rfl: 0   ondansetron (ZOFRAN ODT) 4 MG disintegrating tablet, Take 1 tablet (4 mg total) by mouth every 8 (eight) hours as needed for nausea or  vomiting., Disp: 20 tablet, Rfl: 0   PARoxetine (PAXIL) 10 MG tablet, TAKE 1 TABLET(10 MG) BY MOUTH EVERY MORNING, Disp: 90 tablet, Rfl: 1   PARoxetine (PAXIL) 20 MG tablet, TAKE 1 TABLET BY MOUTH EVERY MORNING, Disp: 90 tablet, Rfl: 1   Potassium 99 MG TABS, Take 99 mg by mouth daily. Reported on 09/20/2015, Disp: , Rfl:    predniSONE (STERAPRED UNI-PAK 21 TAB) 10 MG (21) TBPK tablet, PO: Take 6 tablets on day 1:Take 5 tablets day 2:Take 4 tablets day 3: Take 3 tablets day 4:Take 2 tablets day five: 5 Take 1 tablet day 6, Disp: 21 tablet, Rfl: 0   promethazine (PHENERGAN) 12.5 MG tablet, TAKE 1 TABLET (12.5 MG TOTAL) BY MOUTH AT BEDTIME AS NEEDED FOR NAUSEA OR VOMITING., Disp: 30 tablet, Rfl: 0   rosuvastatin (CRESTOR) 40 MG tablet, TAKE 1 TABLET BY MOUTH EVERY DAY, Disp:  90 tablet, Rfl: 3   Semaglutide, 1 MG/DOSE, (OZEMPIC, 1 MG/DOSE,) 4 MG/3ML SOPN, Inject 1 mg into the skin once a week., Disp: 9 mL, Rfl: 3  EXAM:  VITALS per patient if applicable:  GENERAL: alert, oriented, appears well and in no acute distress  HEENT: atraumatic, conjunttiva clear, no obvious abnormalities on inspection of external nose and ears  NECK: normal movements of the head and neck  LUNGS: on inspection no signs of respiratory distress, breathing rate appears normal, no obvious gross SOB, gasping or wheezing  CV: no obvious cyanosis  MS: moves all visible extremities without noticeable abnormality  PSYCH/NEURO: pleasant and cooperative, no obvious depression or anxiety, speech and thought processing grossly intact  ASSESSMENT AND PLAN:  Discussed the following assessment and plan:  COVID-19  Yeast vaginitis   Discussed treatment options and risk of drug interactions, ideal treatment window, potential complications, isolation and precautions for COVID-19.  She reports she is not worried about the covid but is worried because of the green mucus that she needs an abx. She is higher risk for 2ndary  bacterial infections, though at this point the discolored mucus may simply be from the covid. She reports over all does not feel bad and feels her breathing is better than usual. Opted for trial nasal saline sinus rinses, she is using OTC cough meds and feels those are working and initiation of doxy 100mg  bid x 7 days if worsening or not improving. She requests that diflucan be sent as well as she unfortunately suffers from yeast infections when takes abx. Other symptomatic care measures summarized in patient instructions. Advised to seek prompt virtual visit or in person care if worsening, new symptoms arise, or if is not improving with treatment as expected per our conversation of expected course. Discussed options for follow up care. Did let this patient know that I do telemedicine on Tuesdays and Thursdays for [facility] and those are the days I am logged into the system. Advised to schedule follow up visit with PCP, [facility] virtual visits or [facility] if any further questions or concerns to avoid delays in care.   I discussed the assessment and treatment plan with the patient. The patient was provided an opportunity to ask questions and all were answered. The patient agreed with the plan and demonstrated an understanding of the instructions.     02-08-1982, DO

## 2021-10-21 ENCOUNTER — Other Ambulatory Visit: Payer: Self-pay | Admitting: Internal Medicine

## 2021-10-21 DIAGNOSIS — E118 Type 2 diabetes mellitus with unspecified complications: Secondary | ICD-10-CM

## 2021-10-23 ENCOUNTER — Telehealth: Payer: Self-pay | Admitting: Pharmacist

## 2021-10-23 ENCOUNTER — Ambulatory Visit (INDEPENDENT_AMBULATORY_CARE_PROVIDER_SITE_OTHER): Payer: Medicare HMO

## 2021-10-23 VITALS — Temp 98.1°F | Ht 63.0 in | Wt 264.0 lb

## 2021-10-23 DIAGNOSIS — E1129 Type 2 diabetes mellitus with other diabetic kidney complication: Secondary | ICD-10-CM

## 2021-10-23 DIAGNOSIS — Z0000 Encounter for general adult medical examination without abnormal findings: Secondary | ICD-10-CM | POA: Diagnosis not present

## 2021-10-23 DIAGNOSIS — I2510 Atherosclerotic heart disease of native coronary artery without angina pectoris: Secondary | ICD-10-CM

## 2021-10-23 DIAGNOSIS — R809 Proteinuria, unspecified: Secondary | ICD-10-CM

## 2021-10-23 NOTE — Patient Instructions (Addendum)
[name] , Thank you for taking time to come for your Medicare Wellness Visit. I appreciate your ongoing commitment to your health goals. Please review the following plan we discussed and let me know if I can assist you in the future.   These are the goals we discussed:  Goals       Patient Stated     DIET - INCREASE WATER INTAKE (pt-stated)      Add flavor packets Stay hydrated      Increase physical activity (pt-stated)      Chair exercises  Leg lifts Arm raises Range of motion exercises      Medication Management (pt-stated)      Patient Goals/Self-Care Activities Over the next 90 days, patient will:  - take medications as prescribed check blood glucose using CGM three times daily, document, and provide at future appointments check blood pressure weekly, document, and provide at future appointments collaborate with provider on medication access solutions          This is a list of the screening recommended for you and due dates:  Health Maintenance  Topic Date Due   Hemoglobin A1C  06/14/2021   Eye exam for diabetics  10/23/2021*   COVID-19 Vaccine (1) 11/08/2021*   Flu Shot  12/06/2021*   Zoster (Shingles) Vaccine (1 of 2) 01/20/2022*   Pneumonia Vaccine (3 - PPSV23 if available, else PCV20) 10/23/2022*   Mammogram  10/23/2022*   DEXA scan (bone density measurement)  10/23/2022*   Colon Cancer Screening  10/23/2022*   Tetanus Vaccine  10/23/2022*   Complete foot exam   12/13/2021   Hepatitis C Screening: USPSTF Recommendation to screen - Ages 18-79 yo.  Completed   HPV Vaccine  Aged Out  *Topic was postponed. The date shown is not the original due date.    Advanced directives: not yet completed  Follow up in one year for your annual wellness visit    Preventive Care 65 Years and Older, Female Preventive care refers to lifestyle choices and visits with your health care provider that can promote health and wellness. What does preventive care  include? A yearly physical exam. This is also called an annual well check. Dental exams once or twice a year. Routine eye exams. Ask your health care provider how often you should have your eyes checked. Personal lifestyle choices, including: Daily care of your teeth and gums. Regular physical activity. Eating a healthy diet. Avoiding tobacco and drug use. Limiting alcohol use. Practicing safe sex. Taking low-dose aspirin every day. Taking vitamin and mineral supplements as recommended by your health care provider. What happens during an annual well check? The services and screenings done by your health care provider during your annual well check will depend on your age, overall health, lifestyle risk factors, and family history of disease. Counseling  Your health care provider may ask you questions about your: Alcohol use. Tobacco use. Drug use. Emotional well-being. Home and relationship well-being. Sexual activity. Eating habits. History of falls. Memory and ability to understand (cognition). Work and work Statistician. Reproductive health. Screening  You may have the following tests or measurements: Height, weight, and BMI. Blood pressure. Lipid and cholesterol levels. These may be checked every 5 years, or more frequently if you are over 24 years old. Skin check. Lung cancer screening. You may have this screening every year starting at age 63 if you have a 30-pack-year history of smoking and currently smoke or have quit within the past 15 years. Fecal  occult blood test (FOBT) of the stool. You may have this test every year starting at age 21. Flexible sigmoidoscopy or colonoscopy. You may have a sigmoidoscopy every 5 years or a colonoscopy every 10 years starting at age 74. Hepatitis C blood test. Hepatitis B blood test. Sexually transmitted disease (STD) testing. Diabetes screening. This is done by checking your blood sugar (glucose) after you have not eaten for a while  (fasting). You may have this done every 1-3 years. Bone density scan. This is done to screen for osteoporosis. You may have this done starting at age 27. Mammogram. This may be done every 1-2 years. Talk to your health care provider about how often you should have regular mammograms. Talk with your health care provider about your test results, treatment options, and if necessary, the need for more tests. Vaccines  Your health care provider may recommend certain vaccines, such as: Influenza vaccine. This is recommended every year. Tetanus, diphtheria, and acellular pertussis (Tdap, Td) vaccine. You may need a Td booster every 10 years. Zoster vaccine. You may need this after age 69. Pneumococcal 13-valent conjugate (PCV13) vaccine. One dose is recommended after age 50. Pneumococcal polysaccharide (PPSV23) vaccine. One dose is recommended after age 63. Talk to your health care provider about which screenings and vaccines you need and how often you need them. This information is not intended to replace advice given to you by your health care provider. Make sure you discuss any questions you have with your health care provider. Document Released: 09/21/2015 Document Revised: 05/14/2016 Document Reviewed: 06/26/2015 Elsevier Interactive Patient Education  2017 [employer] Prevention in the Home Falls can cause injuries. They can happen to people of all ages. There are many things you can do to make your home safe and to help prevent falls. What can I do on the outside of my home? Regularly fix the edges of walkways and driveways and fix any cracks. Remove anything that might make you trip as you walk through a door, such as a raised step or threshold. Trim any bushes or trees on the path to your home. Use bright outdoor lighting. Clear any walking paths of anything that might make someone trip, such as rocks or tools. Regularly check to see if handrails are loose or broken. Make sure that  both sides of any steps have handrails. Any raised decks and porches should have guardrails on the edges. Have any leaves, snow, or ice cleared regularly. Use sand or salt on walking paths during winter. Clean up any spills in your garage right away. This includes oil or grease spills. What can I do in the bathroom? Use night lights. Install grab bars by the toilet and in the tub and shower. Do not use towel bars as grab bars. Use non-skid mats or decals in the tub or shower. If you need to sit down in the shower, use a plastic, non-slip stool. Keep the floor dry. Clean up any water that spills on the floor as soon as it happens. Remove soap buildup in the tub or shower regularly. Attach bath mats securely with double-sided non-slip rug tape. Do not have throw rugs and other things on the floor that can make you trip. What can I do in the bedroom? Use night lights. Make sure that you have a light by your bed that is easy to reach. Do not use any sheets or blankets that are too big for your bed. They should not hang down onto the  floor. Have a firm chair that has side arms. You can use this for support while you get dressed. Do not have throw rugs and other things on the floor that can make you trip. What can I do in the kitchen? Clean up any spills right away. Avoid walking on wet floors. Keep items that you use a lot in easy-to-reach places. If you need to reach something above you, use a strong step stool that has a grab bar. Keep electrical cords out of the way. Do not use floor polish or wax that makes floors slippery. If you must use wax, use non-skid floor wax. Do not have throw rugs and other things on the floor that can make you trip. What can I do with my stairs? Do not leave any items on the stairs. Make sure that there are handrails on both sides of the stairs and use them. Fix handrails that are broken or loose. Make sure that handrails are as long as the stairways. Check  any carpeting to make sure that it is firmly attached to the stairs. Fix any carpet that is loose or worn. Avoid having throw rugs at the top or bottom of the stairs. If you do have throw rugs, attach them to the floor with carpet tape. Make sure that you have a light switch at the top of the stairs and the bottom of the stairs. If you do not have them, ask someone to add them for you. What else can I do to help prevent falls? Wear shoes that: Do not have high heels. Have rubber bottoms. Are comfortable and fit you well. Are closed at the toe. Do not wear sandals. If you use a stepladder: Make sure that it is fully opened. Do not climb a closed stepladder. Make sure that both sides of the stepladder are locked into place. Ask someone to hold it for you, if possible. Clearly mark and make sure that you can see: Any grab bars or handrails. First and last steps. Where the edge of each step is. Use tools that help you move around (mobility aids) if they are needed. These include: Canes. Walkers. Scooters. Crutches. Turn on the lights when you go into a dark area. Replace any light bulbs as soon as they burn out. Set up your furniture so you have a clear path. Avoid moving your furniture around. If any of your floors are uneven, fix them. If there are any pets around you, be aware of where they are. Review your medicines with your doctor. Some medicines can make you feel dizzy. This can increase your chance of falling. Ask your doctor what other things that you can do to help prevent falls. This information is not intended to replace advice given to you by your health care provider. Make sure you discuss any questions you have with your health care provider. Document Released: 06/21/2009 Document Revised: 01/31/2016 Document Reviewed: 09/29/2014 Elsevier Interactive Patient Education  2017 [employer].

## 2021-10-23 NOTE — Progress Notes (Signed)
Subjective:   Catherine [name] is a 66 y.o. female who presents for Medicare Annual (Subsequent) preventive examination.  Review of Systems    No ROS.  Medicare Wellness Virtual Visit.  Visual/audio telehealth visit, UTA vital signs.   See social history for additional risk factors.         Objective:    Today's Vitals   10/23/21 1532  Temp: 98.1 F (36.7 C)  Weight: 264 lb (119.7 kg)  Height: 5\' 3"  (1.6 m)   Body mass index is 46.77 kg/m.  Advanced Directives 10/23/2021 02/21/2020 01/19/2020 01/16/2020 01/07/2020 01/07/2020 09/14/2019  Does Patient Have a Medical Advance Directive? No No No No No No No  Would patient like information on creating a medical advance directive? No - Patient declined No - Patient declined No - Patient declined - No - Patient declined No - Patient declined Yes (MAU/Ambulatory/Procedural Areas - Information given)   Current Medications (verified) Outpatient Encounter Medications as of 10/23/2021  Medication Sig   acetaminophen (TYLENOL) 325 MG tablet Take 650 mg by mouth every 6 (six) hours as needed. PRN headache   albuterol (VENTOLIN HFA) 108 (90 Base) MCG/ACT inhaler INHALE 1-2 PUFFS BY MOUTH EVERY 6 HOURS AS NEEDED FOR WHEEZE OR SHORTNESS OF BREATH   Budeson-Glycopyrrol-Formoterol (BREZTRI AEROSPHERE) 160-9-4.8 MCG/ACT AERO Inhale 2 puffs into the lungs 2 (two) times daily.   Cholecalciferol (VITAMIN D3) 125 MCG (5000 UT) CAPS Take 1,000 Units by mouth daily.    insulin aspart (NOVOLOG) 100 UNIT/ML injection Inject up to 15 units TID with meals   insulin degludec (TRESIBA FLEXTOUCH) 100 UNIT/ML FlexTouch Pen Inject 0.3 mLs (30 Units total) into the skin daily.   ipratropium-albuterol (DUONEB) 0.5-2.5 (3) MG/3ML SOLN USE 1 VIAL BY NEBULIZER EVERY 6 HOURS AS NEEDED   Lactobacillus (PROBIOTIC ACIDOPHILUS PO) Take 1 capsule by mouth daily.   lactulose (CHRONULAC) 10 GM/15ML solution TAKE 30 ML EVERY 4 HOURS UNTIL CONSTIPATION IS RELIEVED   lisinopril  (ZESTRIL) 20 MG tablet TAKE 1 TABLET BY MOUTH EVERY DAY   magnesium oxide (MAG-OX) 400 MG tablet Take 400 mg by mouth every other day.   Melatonin 5 MG TABS Take 5 mg by mouth at bedtime.   metFORMIN (GLUCOPHAGE) 1000 MG tablet TAKE 1 TABLET (1,000 MG TOTAL) BY MOUTH 2 (TWO) TIMES DAILY WITH A MEAL.   metoprolol succinate (TOPROL-XL) 25 MG 24 hr tablet TAKE 1 TABLET BY MOUTH EVERY DAY   PARoxetine (PAXIL) 10 MG tablet TAKE 1 TABLET(10 MG) BY MOUTH EVERY MORNING   PARoxetine (PAXIL) 20 MG tablet TAKE 1 TABLET BY MOUTH EVERY DAY IN THE MORNING   Potassium 99 MG TABS Take 99 mg by mouth daily. Reported on 09/20/2015   promethazine (PHENERGAN) 12.5 MG tablet TAKE 1 TABLET (12.5 MG TOTAL) BY MOUTH AT BEDTIME AS NEEDED FOR NAUSEA OR VOMITING.   rosuvastatin (CRESTOR) 40 MG tablet TAKE 1 TABLET BY MOUTH EVERY DAY   Semaglutide, 1 MG/DOSE, (OZEMPIC, 1 MG/DOSE,) 4 MG/3ML SOPN Inject 1 mg into the skin once a week.   aspirin 81 MG EC tablet Take 81 mg by mouth daily.   [DISCONTINUED] benzonatate (TESSALON) 100 MG capsule Take 1 capsule (100 mg total) by mouth 2 (two) times daily as needed for cough.   [DISCONTINUED] doxycycline (VIBRA-TABS) 100 MG tablet Take 1 tablet (100 mg total) by mouth 2 (two) times daily.   [DISCONTINUED] fluconazole (DIFLUCAN) 150 MG tablet Take 1 tablet (150 mg total) by mouth daily.   [DISCONTINUED] nystatin (MYCOSTATIN/NYSTOP)  powder Apply 1 application topically 2 (two) times daily. To rash under breasts until resolved.   [DISCONTINUED] ondansetron (ZOFRAN ODT) 4 MG disintegrating tablet Take 1 tablet (4 mg total) by mouth every 8 (eight) hours as needed for nausea or vomiting.   [DISCONTINUED] predniSONE (STERAPRED UNI-PAK 21 TAB) 10 MG (21) TBPK tablet PO: Take 6 tablets on day 1:Take 5 tablets day 2:Take 4 tablets day 3: Take 3 tablets day 4:Take 2 tablets day five: 5 Take 1 tablet day 6   No facility-administered encounter medications on file as of 10/23/2021.    Allergies  (verified) Sulfa antibiotics, Hydroxychloroquine, Oxycodone-acetaminophen, Percocet [oxycodone-acetaminophen], Vicodin [hydrocodone-acetaminophen], Aspirin, Ibuprofen, and Omnipaque [iohexol]   History: Past Medical History:  Diagnosis Date   Anxiety    Asthma    COPD (chronic obstructive pulmonary disease) (HCC)    Depression    Diabetes mellitus    Gastritis and duodenitis June 2011   EGD   GERD (gastroesophageal reflux disease)    Hyperglycemia    Hyperlipidemia    Hypertension    Leukocytosis    Obesity (BMI 30-39.9)    S/P cardiac catheterization March 2012   normal coronaries,  due to chest pain Mariah Milling)   Screening for breast cancer Jul 28 2011   normal   Screening for colon cancer June 2011   polyps found, next one due 2014 ([facility])   Tobacco abuse, in remission    quit oct during hospitalization    Past Surgical History:  Procedure Laterality Date   ABDOMINAL HYSTERECTOMY     ABDOMINAL HYSTERECTOMY     BILATERAL OOPHORECTOMY  1995   and TAH.  noncancerous reasons   CARDIAC CATHETERIZATION  11/22/2010   no significant disease   Family History  Problem Relation Age of Onset   Coronary artery disease Mother 31   Heart disease Mother        CABG x5   COPD Mother 86   Breast cancer Mother    Kidney disease Mother    Heart failure Father    Angina Father    Diabetes Father    Depression Father    Stroke Father    Dementia Father    Asthma Brother    Asthma Brother    Diabetes Brother    Hypertension Brother    Cancer Sister        breast, lung, brain   Social History   Socioeconomic History   Marital status: Married    Spouse name: Not on file   Number of children: 4   Years of education: Not on file   Highest education level: Not on file  Occupational History   Occupation: Producer, television/film/video: [employer]  Tobacco Use   Smoking status: Former    Packs/day: 1.00    Years: 40.00    Pack years: 40.00    Types: Cigarettes    Quit date: 06/27/2011     Years since quitting: 10.3   Smokeless tobacco: Never  Vaping Use   Vaping Use: Never used  Substance and Sexual Activity   Alcohol use: No   Drug use: No   Sexual activity: Never  Other Topics Concern   Not on file  Social History Narrative   Not on file   Social Determinants of Health   Financial Resource Strain: Medium Risk   Difficulty of Paying Living Expenses: Somewhat hard  Food Insecurity: No Food Insecurity   Worried About Running Out of Food in the Last Year: Never true  Ran Out of Food in the Last Year: Never true  Transportation Needs: No Transportation Needs   Lack of Transportation (Medical): No   Lack of Transportation (Non-Medical): No  Physical Activity: Not on file  Stress: No Stress Concern Present   Feeling of Stress : Not at all  Social Connections: Unknown   Frequency of Communication with Friends and Family: Not on file   Frequency of Social Gatherings with Friends and Family: Not on file   Attends Religious Services: Not on Scientist, clinical (histocompatibility and immunogenetics)file   Active Member of Clubs or Organizations: Not on file   Attends BankerClub or Organization Meetings: Not on file   Marital Status: Married    Tobacco Counseling Counseling given: Not Answered   Clinical Intake:  Pre-visit preparation completed: Yes        Diabetes: Yes (Followed by PCP and CCM)  How often do you need to have someone help you when you read instructions, pamphlets, or other written materials from your doctor or pharmacy?: 1 - Never    Interpreter Needed?: No    Activities of Daily Living No flowsheet data found.  Patient Care Team: Sherlene Shamsullo, Teresa L, MD as PCP - General (Internal Medicine) Lourena Simmondsravis, Catherine E, RPH-CPP as Pharmacist (Pharmacist)  Indicate any recent Medical Services you may have received from other than [facility] providers in the past year (date may be approximate).     Assessment:   This is a routine wellness examination for Gavin PoundDeborah.  Virtual Visit via Telephone Note  I  connected with  Elizbeth Squireseborah Grine on 10/23/21 at  3:30 PM EST by telephone and verified that I am speaking with the correct person using two identifiers.  Persons participating in the virtual visit: patient/Nurse Health Advisor   I discussed the limitations, risks, security and privacy concerns of performing an evaluation and management service by telephone and the availability of in person appointments. The patient expressed understanding and agreed to proceed.  Interactive audio and video telecommunications were attempted between this nurse and patient, however failed, due to patient having technical difficulties OR patient did not have access to video capability.  We continued and completed visit with audio only.  Some vital signs may be absent or patient reported.   Hearing/Vision screen Hearing Screening - Comments:: Patient is able to hear conversational tones without difficulty. No issues reported.  Dietary issues and exercise activities discussed:   Low carb diet Good water intake   Goals Addressed               This Visit's Progress     Patient Stated     Increase physical activity (pt-stated)        Chair exercises  Leg lifts Arm raises Range of motion exercises       Depression Screen PHQ 2/9 Scores 10/23/2021 02/21/2020 09/14/2019 02/18/2019 02/16/2018 08/08/2016 09/20/2015  PHQ - 2 Score 0 0 0 0 0 0 0  PHQ- 9 Score - - - - - - -    Fall Risk Fall Risk  12/13/2020 06/26/2020 04/05/2020 02/21/2020 01/12/2020  Falls in the past year? 0 1 0 0 0  Number falls in past yr: - 0 0 0 -  Comment - - - - -  Injury with Fall? - 0 0 - -  Comment - - - - -  Risk for fall due to : - - - - -  Risk for fall due to: Comment - - - - -  Follow up Falls evaluation completed Falls evaluation completed  Falls evaluation completed Falls evaluation completed Falls evaluation completed   FALL RISK PREVENTION PERTAINING TO THE HOME: Home free of loose throw rugs in walkways, pet beds,  electrical cords, etc? Yes  Adequate lighting in your home to reduce risk of falls? Yes   ASSISTIVE DEVICES UTILIZED TO PREVENT FALLS: Life alert? No  Use of a cane, walker or w/c? No  Grab bars in the bathroom? Yes Shower chair or bench in shower? No  Elevated toilet seat or a handicapped toilet? No   TIMED UP AND GO: Was the test performed? No .   Cognitive Function: Patient is alert and oriented x3.  MMSE - Mini Mental State Exam 02/16/2018  Orientation to time 5  Orientation to Place 5  Registration 3  Attention/ Calculation 5  Recall 3  Language- name 2 objects 2  Language- repeat 1  Language- follow 3 step command 3  Language- read & follow direction 1  Write a sentence 1  Copy design 1  Total score 30     6CIT Screen 02/21/2020 02/18/2019 08/08/2016  What Year? 0 points 0 points 0 points  What month? 0 points 0 points 0 points  What time? - 0 points 0 points  Count back from 20 - 0 points 0 points  Months in reverse 0 points 0 points 0 points  Repeat phrase 0 points - 0 points  Total Score - - 0    Immunizations Immunization History  Administered Date(s) Administered   Fluad Quad(high Dose 65+) 06/26/2020   Hepatitis B 01/21/2012   Influenza Whole 07/10/2011, 08/31/2012   Influenza,inj,Quad PF,6+ Mos 06/12/2014, 10/06/2016, 09/16/2018   Pneumococcal Conjugate-13 06/12/2014   Pneumococcal Polysaccharide-23 12/08/2011   Tdap 12/22/2005   TDAP status: Due, Education has been provided regarding the importance of this vaccine. Advised may receive this vaccine at local pharmacy or Health Dept. Aware to provide a copy of the vaccination record if obtained from local pharmacy or Health Dept. Verbalized acceptance and understanding. Deferred.  Pneumococcal vaccine status: Due, Education has been provided regarding the importance of this vaccine. Advised may receive this vaccine at local pharmacy or Health Dept. Aware to provide a copy of the vaccination record if  obtained from local pharmacy or Health Dept. Verbalized acceptance and understanding.Deferred.   Covid-19 vaccine status: Declined, Education has been provided regarding the importance of this vaccine but patient still declined. Advised may receive this vaccine at local pharmacy or Health Dept.or vaccine clinic. Aware to provide a copy of the vaccination record if obtained from local pharmacy or Health Dept. Verbalized acceptance and understanding.  Shingrix Completed?: No.    Education has been provided regarding the importance of this vaccine. Patient has been advised to call insurance company to determine out of pocket expense if they have not yet received this vaccine. Advised may also receive vaccine at local pharmacy or Health Dept. Verbalized acceptance and understanding.  Screening Tests Health Maintenance  Topic Date Due   HEMOGLOBIN A1C  06/14/2021   OPHTHALMOLOGY EXAM  10/23/2021 (Originally 01/29/2017)   COVID-19 Vaccine (1) 11/08/2021 (Originally 01/28/1957)   INFLUENZA VACCINE  12/06/2021 (Originally 04/08/2021)   Zoster Vaccines- Shingrix (1 of 2) 01/20/2022 (Originally 07/30/2004)   Pneumonia Vaccine 14+ Years old (3 - PPSV23 if available, else PCV20) 10/23/2022 (Originally 07/31/2019)   MAMMOGRAM  10/23/2022 (Originally 08/19/2018)   DEXA SCAN  10/23/2022 (Originally 07/31/2019)   COLONOSCOPY (Pts 45-21yrs Insurance coverage will need to be confirmed)  10/23/2022 (Originally 09/25/2020)   TETANUS/TDAP  10/23/2022 (Originally 12/23/2015)   FOOT EXAM  12/13/2021   Hepatitis C Screening  Completed   HPV VACCINES  Aged Out   Health Maintenance Health Maintenance Due  Topic Date Due   HEMOGLOBIN A1C  06/14/2021   Colonoscopy- deferred per patient.   Dexa Scan- deferred per patient.   Mammogram- deferred per patient.   Lung Cancer Screening: (Low Dose CT Chest recommended if Age 90-80 years, 30 pack-year currently smoking OR have quit w/in 15years.) does not qualify.   Vision  Screening: Recommended annual ophthalmology exams for early detection of glaucoma and other disorders of the eye. Plans to schedule eye exam.   Dental Screening: Recommended annual dental exams for proper oral hygiene.   Community Resource Referral / Chronic Care Management: CRR required this visit?  No   CCM required this visit?  No      Plan:   Keep all routine maintenance appointments.   I have personally reviewed and noted the following in the patients chart:   Medical and social history Use of alcohol, tobacco or illicit drugs  Current medications and supplements including opioid prescriptions.  Functional ability and status Nutritional status Physical activity Advanced directives List of other physicians Hospitalizations, surgeries, and ER visits in previous 12 months Vitals Screenings to include cognitive, depression, and falls Referrals and appointments  In addition, I have reviewed and discussed with patient certain preventive protocols, quality metrics, and best practice recommendations. A written personalized care plan for preventive services as well as general preventive health recommendations were provided to patient via mychart.     Ashok PallOBrien-Blaney, Francesa Eugenio L, LPN   4/09/81192/15/2023

## 2021-10-23 NOTE — Telephone Encounter (Signed)
-----   Message from Varney Biles, LPN sent at 11/06/6008  4:14 PM EST ----- Regarding: LIBRE follow up Hi Catie,  Just finished AWV with patient and she remarked needing libre. Reported she was already on it some time ago-and was told by you that after her telephone visit- it can be ordered. I think she mistook the AWV for CCM follow up. She could not bring any clarity to it at all. Last visit with you was 03/2021. Can you peek at her chart and see what we need to do? Let me know if I need to do anything different.   Thank you,  Denisa

## 2021-10-23 NOTE — Telephone Encounter (Signed)
Patient lost to CCM follow up. Placed order for [place] 2 sensors, though unsure if her insurance will cover since she has not had a visit in the past 6 months to evaluate diabetic control.   Will have patient outreached to schedule follow up with PCP.

## 2021-10-24 NOTE — Telephone Encounter (Signed)
Appointment made for a follow up and labs too. Please order labs, thanks

## 2021-10-24 NOTE — Telephone Encounter (Signed)
Future order placed for A1c, CMP, lipids. I was mistaken, not due for UACR yet.   Dr. Derrel Nip, let me know if you would like anything else placed.

## 2021-10-24 NOTE — Addendum Note (Signed)
Addended by: Lourena Simmonds on: 10/24/2021 11:03 AM   Modules accepted: Orders

## 2021-10-26 DIAGNOSIS — J449 Chronic obstructive pulmonary disease, unspecified: Secondary | ICD-10-CM | POA: Diagnosis not present

## 2021-10-26 DIAGNOSIS — G839 Paralytic syndrome, unspecified: Secondary | ICD-10-CM | POA: Diagnosis not present

## 2021-10-29 DIAGNOSIS — E1165 Type 2 diabetes mellitus with hyperglycemia: Secondary | ICD-10-CM | POA: Diagnosis not present

## 2021-11-12 ENCOUNTER — Ambulatory Visit: Payer: Self-pay | Admitting: Pharmacist

## 2021-11-12 NOTE — Chronic Care Management (AMB) (Signed)
?  Chronic Care Management  ? ?Note ? ?11/12/2021 ?Name: Catherine [name] MRN: [id number] DOB: Apr 18, 1956 ? ? ? ?Closing pharmacy CCM case at this time. Will collaborate with Care Guide to outreach to schedule follow up with RN CM. Patient has clinic contact information for future questions or concerns.  ? ?[name], PharmD, [place], CPP ?Clinical Pharmacist ?Nature conservation officer at [employer] ?[phone] ? ?

## 2021-11-20 ENCOUNTER — Other Ambulatory Visit: Payer: Self-pay | Admitting: Internal Medicine

## 2021-11-23 DIAGNOSIS — J449 Chronic obstructive pulmonary disease, unspecified: Secondary | ICD-10-CM | POA: Diagnosis not present

## 2021-11-23 DIAGNOSIS — G839 Paralytic syndrome, unspecified: Secondary | ICD-10-CM | POA: Diagnosis not present

## 2021-11-26 DIAGNOSIS — E1165 Type 2 diabetes mellitus with hyperglycemia: Secondary | ICD-10-CM | POA: Diagnosis not present

## 2021-11-28 ENCOUNTER — Other Ambulatory Visit: Payer: Medicare HMO

## 2021-12-02 ENCOUNTER — Ambulatory Visit: Payer: Medicare HMO | Admitting: Internal Medicine

## 2021-12-04 ENCOUNTER — Telehealth: Payer: Self-pay

## 2021-12-04 NOTE — Chronic Care Management (AMB) (Signed)
?  Chronic Care Management  ? ?Note ? ?12/04/2021 ?Name: Catherine [name] MRN: [id number] DOB: [birth year] ? ?Catherine [name] is a 66 y.o. year old female who is a primary care patient of Darrick Huntsman, [name], MD. Catherine [name] is currently enrolled in care management services. An additional referral for RNCM was placed.  ? ?Follow up plan: ?Unsuccessful telephone outreach attempt made. The care management team will reach out to the patient again over the next 7 days.  ?If patient returns call to provider office, please advise to call Embedded Care Management Care Guide Penne Lash  at [phone] ? ?Penne Lash, RMA ?Care Guide, Embedded Care Coordination ?[facility]  ?[place], [place] [address] ?Direct Dial: [phone] ?[email or web address] ?Website: [email or web address]  ? ?

## 2021-12-24 DIAGNOSIS — G839 Paralytic syndrome, unspecified: Secondary | ICD-10-CM | POA: Diagnosis not present

## 2021-12-24 DIAGNOSIS — J449 Chronic obstructive pulmonary disease, unspecified: Secondary | ICD-10-CM | POA: Diagnosis not present

## 2021-12-27 DIAGNOSIS — E1165 Type 2 diabetes mellitus with hyperglycemia: Secondary | ICD-10-CM | POA: Diagnosis not present

## 2022-01-06 ENCOUNTER — Other Ambulatory Visit: Payer: Self-pay | Admitting: Family

## 2022-01-06 ENCOUNTER — Other Ambulatory Visit: Payer: Self-pay | Admitting: Internal Medicine

## 2022-01-23 DIAGNOSIS — G839 Paralytic syndrome, unspecified: Secondary | ICD-10-CM | POA: Diagnosis not present

## 2022-01-23 DIAGNOSIS — J449 Chronic obstructive pulmonary disease, unspecified: Secondary | ICD-10-CM | POA: Diagnosis not present

## 2022-01-27 DIAGNOSIS — E1165 Type 2 diabetes mellitus with hyperglycemia: Secondary | ICD-10-CM | POA: Diagnosis not present

## 2022-02-14 ENCOUNTER — Telehealth: Payer: Self-pay | Admitting: Internal Medicine

## 2022-02-14 MED ORDER — IPRATROPIUM-ALBUTEROL 0.5-2.5 (3) MG/3ML IN SOLN: RESPIRATORY_TRACT | 2 refills | Status: DC

## 2022-02-14 NOTE — Telephone Encounter (Signed)
Patient is requesting a refill on her ipratropium-albuterol (DUONEB) 0.5-2.5 (3) MG/3ML SOLN, please send to [facility] in [place].

## 2022-02-14 NOTE — Addendum Note (Signed)
Addended by: Warden Fillers on: 02/14/2022 04:24 PM   Modules accepted: Orders

## 2022-02-14 NOTE — Telephone Encounter (Signed)
Rx refill sent to [facility] as requested.

## 2022-02-23 DIAGNOSIS — G839 Paralytic syndrome, unspecified: Secondary | ICD-10-CM | POA: Diagnosis not present

## 2022-02-23 DIAGNOSIS — J449 Chronic obstructive pulmonary disease, unspecified: Secondary | ICD-10-CM | POA: Diagnosis not present

## 2022-02-27 DIAGNOSIS — E1165 Type 2 diabetes mellitus with hyperglycemia: Secondary | ICD-10-CM | POA: Diagnosis not present

## 2022-02-28 ENCOUNTER — Other Ambulatory Visit (INDEPENDENT_AMBULATORY_CARE_PROVIDER_SITE_OTHER): Payer: Medicare HMO

## 2022-02-28 DIAGNOSIS — I2510 Atherosclerotic heart disease of native coronary artery without angina pectoris: Secondary | ICD-10-CM | POA: Diagnosis not present

## 2022-02-28 DIAGNOSIS — R809 Proteinuria, unspecified: Secondary | ICD-10-CM | POA: Diagnosis not present

## 2022-02-28 DIAGNOSIS — E1129 Type 2 diabetes mellitus with other diabetic kidney complication: Secondary | ICD-10-CM

## 2022-02-28 LAB — COMPREHENSIVE METABOLIC PANEL
ALT: 13 U/L (ref 0–35)
AST: 28 U/L (ref 0–37)
Albumin: 3.8 g/dL (ref 3.5–5.2)
Alkaline Phosphatase: 92 U/L (ref 39–117)
BUN: 10 mg/dL (ref 6–23)
CO2: 35 mEq/L — ABNORMAL HIGH (ref 19–32)
Calcium: 9.3 mg/dL (ref 8.4–10.5)
Chloride: 98 mEq/L (ref 96–112)
Creatinine, Ser: 0.74 mg/dL (ref 0.40–1.20)
GFR: 83.62 mL/min (ref >60.00)
Glucose, Bld: 160 mg/dL — ABNORMAL HIGH (ref 70–99)
Potassium: 4 mEq/L (ref 3.5–5.1)
Sodium: 142 mEq/L (ref 135–145)
Total Bilirubin: 0.4 mg/dL (ref 0.2–1.2)
Total Protein: 7.4 g/dL (ref 6.0–8.3)

## 2022-02-28 LAB — LIPID PANEL
Cholesterol: 118 mg/dL (ref 0–200)
HDL: 34.4 mg/dL — ABNORMAL LOW (ref >39.00)
NonHDL: 83.68
Total CHOL/HDL Ratio: 3
Triglycerides: 204 mg/dL — ABNORMAL HIGH (ref 0.0–149.0)
VLDL: 40.8 mg/dL — ABNORMAL HIGH (ref 0.0–40.0)

## 2022-02-28 LAB — HEMOGLOBIN A1C: Hgb A1c MFr Bld: 6.8 % — ABNORMAL HIGH (ref 4.6–6.5)

## 2022-02-28 LAB — LDL CHOLESTEROL, DIRECT: Direct LDL: 58 mg/dL

## 2022-03-04 ENCOUNTER — Other Ambulatory Visit: Payer: Self-pay | Admitting: Internal Medicine

## 2022-03-04 ENCOUNTER — Encounter: Payer: Self-pay | Admitting: Internal Medicine

## 2022-03-04 ENCOUNTER — Ambulatory Visit (INDEPENDENT_AMBULATORY_CARE_PROVIDER_SITE_OTHER): Payer: Medicare HMO | Admitting: Internal Medicine

## 2022-03-04 VITALS — BP 122/88 | HR 83 | Temp 97.8°F | Ht 61.0 in | Wt 244.6 lb

## 2022-03-04 DIAGNOSIS — I700 Atherosclerosis of aorta: Secondary | ICD-10-CM | POA: Diagnosis not present

## 2022-03-04 DIAGNOSIS — E1129 Type 2 diabetes mellitus with other diabetic kidney complication: Secondary | ICD-10-CM

## 2022-03-04 DIAGNOSIS — E782 Mixed hyperlipidemia: Secondary | ICD-10-CM | POA: Diagnosis not present

## 2022-03-04 DIAGNOSIS — J9611 Chronic respiratory failure with hypoxia: Secondary | ICD-10-CM | POA: Diagnosis not present

## 2022-03-04 DIAGNOSIS — R809 Proteinuria, unspecified: Secondary | ICD-10-CM | POA: Diagnosis not present

## 2022-03-04 DIAGNOSIS — J432 Centrilobular emphysema: Secondary | ICD-10-CM

## 2022-03-04 DIAGNOSIS — I10 Essential (primary) hypertension: Secondary | ICD-10-CM

## 2022-03-04 LAB — MICROALBUMIN / CREATININE URINE RATIO
Creatinine,U: 100.1 mg/dL
Microalb Creat Ratio: 6 mg/g (ref 0.0–30.0)
Microalb, Ur: 6 mg/dL — ABNORMAL HIGH (ref 0.0–1.9)

## 2022-03-04 MED ORDER — ALPRAZOLAM 0.25 MG PO TABS: 0.2500 mg | ORAL_TABLET | Freq: Every evening | ORAL | 0 refills | Status: DC | PRN

## 2022-03-04 MED ORDER — IPRATROPIUM-ALBUTEROL 0.5-2.5 (3) MG/3ML IN SOLN: RESPIRATORY_TRACT | 2 refills | Status: DC

## 2022-03-04 MED ORDER — BUPROPION HCL ER (XL) 150 MG PO TB24: 150.0000 mg | ORAL_TABLET | Freq: Every day | ORAL | 1 refills | Status: DC

## 2022-03-04 NOTE — Progress Notes (Signed)
Subjective:  Patient ID: Catherine [name], female    DOB: Oct 10, 1955  Age: 66 y.o. MRN: [id number]  CC: The primary encounter diagnosis was Primary hypertension. Diagnoses of Microalbuminuria due to type 2 diabetes mellitus (HCC), Mixed hyperlipidemia, Chronic hypoxemic respiratory failure (HCC), Centrilobular emphysema (HCC), and Aortic atherosclerosis (HCC) were also pertinent to this visit.   HPI Catherine [name] presents for follow up on type 2 DM, hypertension, LAST VISIT APRIL 2022  Chief Complaint  Patient presents with   Follow-up    Follow up on diabetes, hypertension    Grief:  her 62 yr old son  died in 2022-12-05 etiology of liver failure was unclear , autopsy done.  She is grieving and confused. Liver and kidney failure. No alcohol or drug use. She was unable to visit him at [facility] due to her health issues.   Not sleeping,  crying most days.  Underlying depression ; doesn't want to leave her home.  No suicidal .  Lives with youngest  son and husband   COPD using duoneb 4 times daily   Dm:  USING NOVOLOG 16 UNITS  QAC ,  1 MG OZEMPIC,  METFORMIN , AND NO TRESIBA   HTN: taking lisinopril and metoprolol.  Not checking unless having a headache.  Last night had a headache and BP was 190/72   Aortic atherosclerosis : reviewed prior CT .  Taking Crestor.    Outpatient Medications Prior to Visit  Medication Sig Dispense Refill   albuterol (VENTOLIN HFA) 108 (90 Base) MCG/ACT inhaler INHALE 1-2 PUFFS BY MOUTH EVERY 6 HOURS AS NEEDED FOR WHEEZE OR SHORTNESS OF BREATH 8.5 each 3   aspirin 81 MG EC tablet Take 81 mg by mouth daily.     Budeson-Glycopyrrol-Formoterol (BREZTRI AEROSPHERE) 160-9-4.8 MCG/ACT AERO Inhale 2 puffs into the lungs 2 (two) times daily. 32.1 g 3   Cholecalciferol (VITAMIN D3) 125 MCG (5000 UT) CAPS Take 1,000 Units by mouth daily.      insulin aspart (NOVOLOG) 100 UNIT/ML injection Inject up to 15 units TID with meals 15 mL 0   Lactobacillus (PROBIOTIC  ACIDOPHILUS PO) Take 1 capsule by mouth daily.     lisinopril (ZESTRIL) 20 MG tablet TAKE 1 TABLET BY MOUTH EVERY DAY 90 tablet 1   MAGNESIUM CITRATE PO Take 1 tablet by mouth daily.     Melatonin 5 MG TABS Take 5 mg by mouth at bedtime.     metFORMIN (GLUCOPHAGE) 1000 MG tablet TAKE 1 TABLET (1,000 MG TOTAL) BY MOUTH 2 (TWO) TIMES DAILY WITH A MEAL. 180 tablet 1   metoprolol succinate (TOPROL-XL) 25 MG 24 hr tablet TAKE 1 TABLET BY MOUTH EVERY DAY 90 tablet 3   PARoxetine (PAXIL) 20 MG tablet TAKE 1 TABLET BY MOUTH EVERY DAY IN THE MORNING 90 tablet 1   Potassium 99 MG TABS Take 99 mg by mouth daily. Reported on 09/20/2015     promethazine (PHENERGAN) 12.5 MG tablet TAKE 1 TABLET (12.5 MG TOTAL) BY MOUTH AT BEDTIME AS NEEDED FOR NAUSEA OR VOMITING. 30 tablet 0   rosuvastatin (CRESTOR) 40 MG tablet TAKE 1 TABLET BY MOUTH EVERY DAY 90 tablet 3   insulin degludec (TRESIBA FLEXTOUCH) 100 UNIT/ML FlexTouch Pen Inject 0.3 mLs (30 Units total) into the skin daily. 36 mL 2   ipratropium-albuterol (DUONEB) 0.5-2.5 (3) MG/3ML SOLN USE 1 VIAL BY NEBULIZER EVERY 6 HOURS AS NEEDED 270 mL 2   lactulose (CHRONULAC) 10 GM/15ML solution TAKE 30 ML EVERY 4 HOURS UNTIL CONSTIPATION  IS RELIEVED 473 mL 1   Semaglutide, 1 MG/DOSE, (OZEMPIC, 1 MG/DOSE,) 4 MG/3ML SOPN Inject 1 mg into the skin once a week. (Patient not taking: Reported on 03/04/2022) 9 mL 3   acetaminophen (TYLENOL) 325 MG tablet Take 650 mg by mouth every 6 (six) hours as needed. PRN headache     magnesium oxide (MAG-OX) 400 MG tablet Take 400 mg by mouth every other day. (Patient not taking: Reported on 03/04/2022)     PARoxetine (PAXIL) 10 MG tablet TAKE 1 TABLET(10 MG) BY MOUTH EVERY MORNING 90 tablet 1   No facility-administered medications prior to visit.    Review of Systems;  Patient denies headache, fevers, malaise, unintentional weight loss, skin rash, eye pain, sinus congestion and sinus pain, sore throat, dysphagia,  hemoptysis , cough,  dyspnea, wheezing, chest pain, palpitations, orthopnea, edema, abdominal pain, nausea, melena, diarrhea, constipation, flank pain, dysuria, hematuria, urinary  Frequency, nocturia, numbness, tingling, seizures,  Focal weakness, Loss of consciousness,  Tremor, insomnia, depression, anxiety, and suicidal ideation.      Objective:  BP 122/88 (BP Location: Left Arm, Patient Position: Sitting, Cuff Size: Normal)   Pulse 83   Temp 97.8 F (36.6 C) (Oral)   Ht 5\' 1"  (1.549 m)   Wt 244 lb 9.6 oz (110.9 kg)   SpO2 96%   BMI 46.22 kg/m   BP Readings from Last 3 Encounters:  03/04/22 122/88  12/13/20 (!) 162/96  08/15/20 (!) 154/78    Wt Readings from Last 3 Encounters:  03/04/22 244 lb 9.6 oz (110.9 kg)  10/23/21 264 lb (119.7 kg)  02/05/21 264 lb (119.7 kg)    General appearance: alert, cooperative and appears stated age Ears: normal TM's and external ear canals both ears Throat: lips, mucosa, and tongue normal; teeth and gums normal Neck: no adenopathy, no carotid bruit, supple, symmetrical, trachea midline and thyroid not enlarged, symmetric, no tenderness/mass/nodules Back: symmetric, no curvature. ROM normal. No CVA tenderness. Lungs: clear to auscultation bilaterally Heart: regular rate and rhythm, S1, S2 normal, no murmur, click, rub or gallop Abdomen: soft, non-tender; bowel sounds normal; no masses,  no organomegaly Pulses: 2+ and symmetric Skin: Skin color, texture, turgor normal. No rashes or lesions Lymph nodes: Cervical, supraclavicular, and axillary nodes normal.  Lab Results  Component Value Date   HGBA1C 6.8 (H) 02/28/2022   HGBA1C 7.0 (H) 12/13/2020   HGBA1C 7.2 (A) 06/26/2020    Lab Results  Component Value Date   CREATININE 0.74 02/28/2022   CREATININE 0.84 12/13/2020   CREATININE 0.78 06/26/2020    Lab Results  Component Value Date   WBC 10.3 01/19/2020   HGB 13.6 01/19/2020   HCT 41.8 01/19/2020   PLT 293 01/19/2020   GLUCOSE 160 (H) 02/28/2022    CHOL 118 02/28/2022   TRIG 204.0 (H) 02/28/2022   HDL 34.40 (L) 02/28/2022   LDLDIRECT 58.0 02/28/2022   LDLCALC 45 12/13/2020   ALT 13 02/28/2022   AST 28 02/28/2022   NA 142 02/28/2022   K 4.0 02/28/2022   CL 98 02/28/2022   CREATININE 0.74 02/28/2022   BUN 10 02/28/2022   CO2 35 (H) 02/28/2022   TSH 1.10 01/12/2020   INR 0.9 07/22/2012   HGBA1C 6.8 (H) 02/28/2022   MICROALBUR 6.0 (H) 03/04/2022     Assessment & Plan:   Problem List Items Addressed This Visit     Microalbuminuria due to type 2 diabetes mellitus (HCC)     She is taking an ACE Inhibitor .  Lab Results  Component Value Date   MICROALBUR 6.0 (H) 03/04/2022   MICROALBUR 4.4 (H) 12/13/2020    '      Relevant Orders   Comprehensive metabolic panel   Hemoglobin A1c   Urine microalbumin-creatinine with uACR (Completed)   Hyperlipidemia   Relevant Orders   Lipid panel   LDL cholesterol, direct   HTN (hypertension) - Primary    Well controlled on current regimen. Renal function stable, no changes today.      Relevant Orders   Comprehensive metabolic panel   Aortic atherosclerosis (HCC)    Reviewed findings of prior CT scan today..  Patient is tolerating high potency statin therapy with crestor 40 mg daily       Emphysema of lung (HCC)    She is severely limited in activities due to chronic hypoxia .  She is using Duoneb at home qid  . She no longer sees Pulmonary by choice       Relevant Medications   ipratropium-albuterol (DUONEB) 0.5-2.5 (3) MG/3ML SOLN   Chronic hypoxemic respiratory failure (HCC)    She is chronically oxygen dependent , requiring 4 L/min, which requires her to travel with additional battery packs to maintain equipment.        I spent a total of  30  minutes with this patient in a face to face visit on the date of this encounter reviewing the last office visit with me in 2021,  most recent with patient's cardiologist, patient'ss diet and eating habits, home blood pressure  readings ,  most recent imaging study ,   and post visit ordering of testing and therapeutics.    Follow-up: Return in about 3 months (around 06/04/2022).   Sherlene Shams, MD

## 2022-03-04 NOTE — Assessment & Plan Note (Addendum)
She is severely limited in activities due to chronic hypoxia .  She is using Duoneb at home qid  . She no longer sees Pulmonary by choice

## 2022-03-04 NOTE — Assessment & Plan Note (Signed)
Reviewed findings of prior CT scan today..  Patient is tolerating high potency statin therapy with crestor 40 mg daily

## 2022-03-05 ENCOUNTER — Encounter: Payer: Self-pay | Admitting: Internal Medicine

## 2022-03-05 NOTE — Assessment & Plan Note (Signed)
Remains controlled with higher metformin dose, Ozempic,  Tresiba and  mealtime insulin. She has lost weight as well.  Lab Results  Component Value Date   HGBA1C 6.8 (H) 02/28/2022   Lab Results  Component Value Date   MICROALBUR 6.0 (H) 03/04/2022   MICROALBUR 4.4 (H) 12/13/2020

## 2022-03-05 NOTE — Assessment & Plan Note (Addendum)
She is taking an ACE Inhibitor .  Lab Results  Component Value Date   MICROALBUR 6.0 (H) 03/04/2022   MICROALBUR 4.4 (H) 12/13/2020    '

## 2022-03-05 NOTE — Assessment & Plan Note (Signed)
Well controlled on current regimen. Renal function stable, no changes today. 

## 2022-03-05 NOTE — Assessment & Plan Note (Signed)
She is chronically oxygen dependent , requiring 4 L/min, which requires her to travel with additional battery packs to maintain equipment.

## 2022-03-25 DIAGNOSIS — G839 Paralytic syndrome, unspecified: Secondary | ICD-10-CM | POA: Diagnosis not present

## 2022-03-25 DIAGNOSIS — J449 Chronic obstructive pulmonary disease, unspecified: Secondary | ICD-10-CM | POA: Diagnosis not present

## 2022-03-28 ENCOUNTER — Other Ambulatory Visit: Payer: Self-pay | Admitting: Internal Medicine

## 2022-03-29 DIAGNOSIS — E1165 Type 2 diabetes mellitus with hyperglycemia: Secondary | ICD-10-CM | POA: Diagnosis not present

## 2022-04-08 ENCOUNTER — Other Ambulatory Visit: Payer: Self-pay | Admitting: Family

## 2022-04-17 ENCOUNTER — Other Ambulatory Visit: Payer: Self-pay | Admitting: Internal Medicine

## 2022-04-17 DIAGNOSIS — E118 Type 2 diabetes mellitus with unspecified complications: Secondary | ICD-10-CM

## 2022-04-25 DIAGNOSIS — J449 Chronic obstructive pulmonary disease, unspecified: Secondary | ICD-10-CM | POA: Diagnosis not present

## 2022-04-25 DIAGNOSIS — G839 Paralytic syndrome, unspecified: Secondary | ICD-10-CM | POA: Diagnosis not present

## 2022-04-29 ENCOUNTER — Other Ambulatory Visit: Payer: Self-pay

## 2022-04-29 DIAGNOSIS — J432 Centrilobular emphysema: Secondary | ICD-10-CM

## 2022-04-29 DIAGNOSIS — J9611 Chronic respiratory failure with hypoxia: Secondary | ICD-10-CM

## 2022-04-29 DIAGNOSIS — E1165 Type 2 diabetes mellitus with hyperglycemia: Secondary | ICD-10-CM | POA: Diagnosis not present

## 2022-04-29 MED ORDER — PROMETHAZINE HCL 12.5 MG PO TABS: 12.5000 mg | ORAL_TABLET | Freq: Every evening | ORAL | 0 refills | Status: DC | PRN

## 2022-04-29 MED ORDER — ALBUTEROL SULFATE HFA 108 (90 BASE) MCG/ACT IN AERS: 1.0000 | INHALATION_SPRAY | Freq: Four times a day (QID) | RESPIRATORY_TRACT | 3 refills | Status: DC | PRN

## 2022-04-29 NOTE — Telephone Encounter (Signed)
Patient states [facility] [place] sent [place] a refill request and they have not received it yet for the following:   promethazine (PHENERGAN) 12.5 MG tablet (patient states she is out of this medication)  albuterol (VENTOLIN HFA) 108 (90 Base) MCG/ACT inhaler (patient states she is out of this medication)  ALPRAZolam (XANAX) 0.25 MG tablet (patient states she has three more left)

## 2022-04-29 NOTE — Telephone Encounter (Signed)
Refilled: 03/04/2022 Last OV: 03/04/2022 Next OV: not scheduled

## 2022-04-30 ENCOUNTER — Telehealth: Payer: Self-pay | Admitting: Internal Medicine

## 2022-04-30 MED ORDER — ALPRAZOLAM 0.25 MG PO TABS: 0.2500 mg | ORAL_TABLET | Freq: Every evening | ORAL | 0 refills | Status: DC | PRN

## 2022-04-30 NOTE — Telephone Encounter (Signed)
Pt need refill on ipratropium-albuterol sent to [facility] in [place]

## 2022-05-01 MED ORDER — IPRATROPIUM-ALBUTEROL 0.5-2.5 (3) MG/3ML IN SOLN: RESPIRATORY_TRACT | 2 refills | Status: DC

## 2022-05-01 NOTE — Telephone Encounter (Signed)
Medication has been refilled.

## 2022-05-14 ENCOUNTER — Other Ambulatory Visit: Payer: Self-pay | Admitting: Internal Medicine

## 2022-05-26 DIAGNOSIS — G839 Paralytic syndrome, unspecified: Secondary | ICD-10-CM | POA: Diagnosis not present

## 2022-05-26 DIAGNOSIS — J449 Chronic obstructive pulmonary disease, unspecified: Secondary | ICD-10-CM | POA: Diagnosis not present

## 2022-05-30 DIAGNOSIS — E1165 Type 2 diabetes mellitus with hyperglycemia: Secondary | ICD-10-CM | POA: Diagnosis not present

## 2022-06-04 ENCOUNTER — Other Ambulatory Visit: Payer: Self-pay | Admitting: Internal Medicine

## 2022-06-05 NOTE — Telephone Encounter (Signed)
Refilled: 04/29/2022 Last OV: 03/04/2022 Next OV: not scheduled

## 2022-06-19 ENCOUNTER — Emergency Department
Admission: EM | Admit: 2022-06-19 | Discharge: 2022-06-19 | Disposition: A | Discharge status: Home / Self Care | Payer: Medicare HMO | Attending: Emergency Medicine | Admitting: Emergency Medicine

## 2022-06-19 ENCOUNTER — Emergency Department: Payer: Medicare HMO

## 2022-06-19 ENCOUNTER — Other Ambulatory Visit: Payer: Self-pay

## 2022-06-19 ENCOUNTER — Encounter: Payer: Self-pay | Admitting: Emergency Medicine

## 2022-06-19 DIAGNOSIS — I10 Essential (primary) hypertension: Secondary | ICD-10-CM | POA: Insufficient documentation

## 2022-06-19 DIAGNOSIS — I2510 Atherosclerotic heart disease of native coronary artery without angina pectoris: Secondary | ICD-10-CM | POA: Diagnosis not present

## 2022-06-19 DIAGNOSIS — R0689 Other abnormalities of breathing: Secondary | ICD-10-CM | POA: Diagnosis not present

## 2022-06-19 DIAGNOSIS — J441 Chronic obstructive pulmonary disease with (acute) exacerbation: Secondary | ICD-10-CM | POA: Diagnosis not present

## 2022-06-19 DIAGNOSIS — J80 Acute respiratory distress syndrome: Secondary | ICD-10-CM | POA: Diagnosis not present

## 2022-06-19 DIAGNOSIS — R0602 Shortness of breath: Secondary | ICD-10-CM | POA: Diagnosis not present

## 2022-06-19 DIAGNOSIS — R062 Wheezing: Secondary | ICD-10-CM | POA: Diagnosis not present

## 2022-06-19 LAB — CBC WITH DIFFERENTIAL/PLATELET
Abs Immature Granulocytes: 0.03 10*3/uL (ref 0.00–0.07)
Basophils Absolute: 0.1 10*3/uL (ref 0.0–0.1)
Basophils Relative: 1 %
Eosinophils Absolute: 0.5 10*3/uL (ref 0.0–0.5)
Eosinophils Relative: 4 %
HCT: 43.1 % (ref 36.0–46.0)
Hemoglobin: 13.7 g/dL (ref 12.0–15.0)
Immature Granulocytes: 0 %
Lymphocytes Relative: 34 %
Lymphs Abs: 4.3 10*3/uL — ABNORMAL HIGH (ref 0.7–4.0)
MCH: 28 pg (ref 26.0–34.0)
MCHC: 31.8 g/dL (ref 30.0–36.0)
MCV: 88.1 fL (ref 80.0–100.0)
Monocytes Absolute: 0.7 10*3/uL (ref 0.1–1.0)
Monocytes Relative: 6 %
Neutro Abs: 7.2 10*3/uL (ref 1.7–7.7)
Neutrophils Relative %: 55 %
Platelets: 283 10*3/uL (ref 150–400)
RBC: 4.89 MIL/uL (ref 3.87–5.11)
RDW: 12.8 % (ref 11.5–15.5)
WBC: 12.9 10*3/uL — ABNORMAL HIGH (ref 4.0–10.5)
nRBC: 0 % (ref 0.0–0.2)

## 2022-06-19 LAB — BASIC METABOLIC PANEL
Anion gap: 13 (ref 5–15)
BUN: 10 mg/dL (ref 8–23)
CO2: 31 mmol/L (ref 22–32)
Calcium: 9.1 mg/dL (ref 8.9–10.3)
Chloride: 100 mmol/L (ref 98–111)
Creatinine, Ser: 0.93 mg/dL (ref 0.44–1.00)
GFR, Estimated: >60 mL/min (ref >60)
Glucose, Bld: 203 mg/dL — ABNORMAL HIGH (ref 70–99)
Potassium: 3.1 mmol/L — ABNORMAL LOW (ref 3.5–5.1)
Sodium: 144 mmol/L (ref 135–145)

## 2022-06-19 MED ORDER — DOXYCYCLINE HYCLATE 100 MG PO CAPS: 100.0000 mg | ORAL_CAPSULE | Freq: Two times a day (BID) | ORAL | 0 refills | Status: DC

## 2022-06-19 MED ORDER — PREDNISONE 20 MG PO TABS: 40.0000 mg | ORAL_TABLET | Freq: Every day | ORAL | 0 refills | Status: AC

## 2022-06-19 MED ORDER — ALBUTEROL SULFATE (2.5 MG/3ML) 0.083% IN NEBU
5.0000 mg | INHALATION_SOLUTION | Freq: Once | RESPIRATORY_TRACT | Status: AC
  Administered 2022-06-19: 5 mg via RESPIRATORY_TRACT
  Filled 2022-06-19: qty 6

## 2022-06-19 NOTE — ED Provider Notes (Signed)
[facility] Provider Note    Event Date/Time   First MD Initiated Contact with Patient 06/19/22 [phone]     (approximate)   History   Chief Complaint: Shortness of Breath   HPI  Catherine [name] is a 66 y.o. female with a history of CAD, hypertension, GERD, obesity, COPD on 3 L nasal cannula at all times, diabetes who comes ED complaining of shortness of breath that she noticed when she woke up this morning.  No chest pain.  No fever or increasing cough above her baseline chronic nonproductive cough.  Tried using her home nebulizer treatment without relief.  EMS gave IV Solu-Medrol and 2 nebs en route and she is starting to feel better.      Physical Exam   Triage Vital Signs: ED Triage Vitals  Enc Vitals Group     BP 06/19/22 0937 (!) 186/82     Pulse Rate 06/19/22 0937 89     Resp 06/19/22 0940 (!) 21     Temp 06/19/22 0940 98 F (36.7 C)     Temp src --      SpO2 --      Weight 06/19/22 0938 244 lb 7.8 oz (110.9 kg)     Height 06/19/22 0938 5\' 1"  (1.549 m)     Head Circumference --      Peak Flow --      Pain Score 06/19/22 0938 0     Pain Loc --      Pain Edu? --      Excl. in [place]? --     Most recent vital signs: Vitals:   06/19/22 0940 06/19/22 1100  BP:  (!) 144/73  Pulse:  88  Resp: (!) 21 (!) 22  Temp: 98 F (36.7 C)   SpO2:  96%    General: Awake, no distress.  CV:  Good peripheral perfusion.  Regular rate and rhythm Resp:  Normal effort.  Symmetric breath sounds bilaterally.  Slight expiratory wheezing.  Normal expiratory phase. Abd:  No distention.  Soft nontender Other:  No lower extremity edema or calf tenderness.   ED Results / Procedures / Treatments   Labs (all labs ordered are listed, but only abnormal results are displayed) Labs Reviewed  BASIC METABOLIC PANEL - Abnormal; Notable for the following components:      Result Value   Potassium 3.1 (*)    Glucose, Bld 203 (*)    All other components within normal  limits  CBC WITH DIFFERENTIAL/PLATELET - Abnormal; Notable for the following components:   WBC 12.9 (*)    Lymphs Abs 4.3 (*)    All other components within normal limits  BLOOD GAS, VENOUS - Abnormal; Notable for the following components:   pO2, Ven 55 (*)    Bicarbonate 32.8 (*)    Acid-Base Excess 6.5 (*)    All other components within normal limits     EKG Interpreted by me Sinus rhythm rate of 88.  Normal axis and intervals.  Poor R wave progression.  Normal ST segments and T waves.   RADIOLOGY Chest x-ray interpreted by me, appears unremarkable.  Radiology report reviewed.   PROCEDURES:  Procedures   MEDICATIONS ORDERED IN ED: Medications  albuterol (PROVENTIL) (2.5 MG/3ML) 0.083% nebulizer solution 5 mg (5 mg Nebulization Given 06/19/22 1001)     IMPRESSION / MDM / ASSESSMENT AND PLAN / ED COURSE  I reviewed the triage vital signs and the nursing notes.  Differential diagnosis includes, but is not limited to, COPD exacerbation, pneumonia, pulmonary edema, pleural effusion, pneumothorax, AKI, electrolyte abnormality, anemia  Patient's presentation is most consistent with severe exacerbation of chronic illness.  Pt p/w sob, wheezing c/w COPD exac. No chest pain. Sx imroved after solumedrol and 2 nebs by ems. Will give additional nebs while obtaining cxr and labs. If workup is reassuring, I think she is stable for dc with prednisone course and abx ppx.   ----------------------------------------- 12:22 PM on 06/19/2022 ----------------------------------------- Feeling better, wheezing resolved.  Work-up unremarkable.  Will discharge with prescription for doxycycline and prednisone, continue bronchodilators at home.  Follow-up with primary care.       FINAL CLINICAL IMPRESSION(S) / ED DIAGNOSES   Final diagnoses:  COPD exacerbation ([place])     Rx / DC Orders   ED Discharge Orders          Ordered    doxycycline (VIBRAMYCIN)  100 MG capsule  2 times daily        06/19/22 1220    predniSONE (DELTASONE) 20 MG tablet  Daily with breakfast        06/19/22 1220             Note:  This document was prepared using Dragon voice recognition software and may include unintentional dictation errors.   Carrie Mew, MD 06/19/22 1222

## 2022-06-19 NOTE — ED Notes (Signed)
Pt on 4L [place] chronically, pt did not bring O2 from home, family member contacting someone to bring pt's O2 so pt can be discharged.

## 2022-06-19 NOTE — ED Triage Notes (Signed)
Pt to ER via EMS from home with c/o [facility] sudden onset this AM upon rising.  Pt states normally has some SHOB in AM, but it was much worse this AM.  Pt alert and oriented, warm and dry.  Talking in complete sentences at present.Pt given duoneb, and 125mg  Solumedrol en route.

## 2022-06-19 NOTE — ED Notes (Signed)
Pt in [place]  waiting for ride, pt on O2 4l, o2 tank checked.

## 2022-06-19 NOTE — ED Notes (Signed)
Pt placed in subwait to wait for family to bring O2 tank, pt on O2 4l [place] in subwait, first nurse notified.

## 2022-06-19 NOTE — ED Notes (Signed)
Right AC IV removed.  

## 2022-06-25 DIAGNOSIS — G839 Paralytic syndrome, unspecified: Secondary | ICD-10-CM | POA: Diagnosis not present

## 2022-06-25 DIAGNOSIS — J449 Chronic obstructive pulmonary disease, unspecified: Secondary | ICD-10-CM | POA: Diagnosis not present

## 2022-06-25 LAB — BLOOD GAS, VENOUS
Acid-Base Excess: 6.5 mmol/L — ABNORMAL HIGH (ref 0.0–2.0)
Bicarbonate: 32.8 mmol/L — ABNORMAL HIGH (ref 20.0–28.0)
O2 Saturation: 88.1 %
Patient temperature: 37
pCO2, Ven: 53 mmHg (ref 44–60)
pH, Ven: 7.4 (ref 7.25–7.43)
pO2, Ven: 55 mmHg — ABNORMAL HIGH (ref 32–45)

## 2022-06-30 DIAGNOSIS — E1165 Type 2 diabetes mellitus with hyperglycemia: Secondary | ICD-10-CM | POA: Diagnosis not present

## 2022-07-01 ENCOUNTER — Other Ambulatory Visit: Payer: Self-pay | Admitting: Family

## 2022-07-01 ENCOUNTER — Other Ambulatory Visit: Payer: Self-pay | Admitting: Internal Medicine

## 2022-07-02 NOTE — Telephone Encounter (Signed)
Refilled: 06/06/2022 Last OV: 03/04/2022 Next OV: not scheduled

## 2022-07-04 NOTE — Telephone Encounter (Signed)
Pt need a [facility]ill [facility] and pt states she need a stronger xanax because the current dose is not working. Pt also states the Wellbutrin is causing weakness in her legs

## 2022-07-07 NOTE — Telephone Encounter (Signed)
I have refilled the lactulose. Pt is scheduled for a follow up on Thursday.

## 2022-07-11 ENCOUNTER — Encounter: Payer: Self-pay | Admitting: Internal Medicine

## 2022-07-11 ENCOUNTER — Ambulatory Visit (INDEPENDENT_AMBULATORY_CARE_PROVIDER_SITE_OTHER): Payer: Medicare HMO | Admitting: Internal Medicine

## 2022-07-11 VITALS — BP 158/86 | Temp 98.0°F | Ht 61.0 in | Wt 237.8 lb

## 2022-07-11 DIAGNOSIS — J9611 Chronic respiratory failure with hypoxia: Secondary | ICD-10-CM

## 2022-07-11 DIAGNOSIS — E1129 Type 2 diabetes mellitus with other diabetic kidney complication: Secondary | ICD-10-CM | POA: Diagnosis not present

## 2022-07-11 DIAGNOSIS — J440 Chronic obstructive pulmonary disease with acute lower respiratory infection: Secondary | ICD-10-CM | POA: Diagnosis not present

## 2022-07-11 DIAGNOSIS — I10 Essential (primary) hypertension: Secondary | ICD-10-CM

## 2022-07-11 DIAGNOSIS — R809 Proteinuria, unspecified: Secondary | ICD-10-CM

## 2022-07-11 DIAGNOSIS — E782 Mixed hyperlipidemia: Secondary | ICD-10-CM | POA: Diagnosis not present

## 2022-07-11 DIAGNOSIS — F419 Anxiety disorder, unspecified: Secondary | ICD-10-CM | POA: Diagnosis not present

## 2022-07-11 DIAGNOSIS — R69 Illness, unspecified: Secondary | ICD-10-CM | POA: Diagnosis not present

## 2022-07-11 DIAGNOSIS — J209 Acute bronchitis, unspecified: Secondary | ICD-10-CM | POA: Diagnosis not present

## 2022-07-11 LAB — COMPREHENSIVE METABOLIC PANEL
ALT: 14 U/L (ref 0–35)
AST: 28 U/L (ref 0–37)
Albumin: 3.8 g/dL (ref 3.5–5.2)
Alkaline Phosphatase: 99 U/L (ref 39–117)
BUN: 11 mg/dL (ref 6–23)
CO2: 37 mEq/L — ABNORMAL HIGH (ref 19–32)
Calcium: 9.3 mg/dL (ref 8.4–10.5)
Chloride: 98 mEq/L (ref 96–112)
Creatinine, Ser: 0.76 mg/dL (ref 0.40–1.20)
GFR: 80.78 mL/min (ref >60.00)
Glucose, Bld: 165 mg/dL — ABNORMAL HIGH (ref 70–99)
Potassium: 4 mEq/L (ref 3.5–5.1)
Sodium: 143 mEq/L (ref 135–145)
Total Bilirubin: 0.4 mg/dL (ref 0.2–1.2)
Total Protein: 6.7 g/dL (ref 6.0–8.3)

## 2022-07-11 LAB — LIPID PANEL
Cholesterol: 127 mg/dL (ref 0–200)
HDL: 43.5 mg/dL (ref >39.00)
NonHDL: 83.99
Total CHOL/HDL Ratio: 3
Triglycerides: 233 mg/dL — ABNORMAL HIGH (ref 0.0–149.0)
VLDL: 46.6 mg/dL — ABNORMAL HIGH (ref 0.0–40.0)

## 2022-07-11 LAB — LDL CHOLESTEROL, DIRECT: Direct LDL: 52 mg/dL

## 2022-07-11 LAB — HEMOGLOBIN A1C: Hgb A1c MFr Bld: 8.2 % — ABNORMAL HIGH (ref 4.6–6.5)

## 2022-07-11 MED ORDER — LOSARTAN POTASSIUM 100 MG PO TABS: 100.0000 mg | ORAL_TABLET | Freq: Every day | ORAL | 1 refills | Status: DC

## 2022-07-11 MED ORDER — PREDNISONE 10 MG PO TABS: ORAL_TABLET | ORAL | 0 refills | Status: DC

## 2022-07-11 MED ORDER — ALPRAZOLAM 0.25 MG PO TABS: 0.2500 mg | ORAL_TABLET | Freq: Every evening | ORAL | 5 refills | Status: DC | PRN

## 2022-07-11 NOTE — Progress Notes (Addendum)
Subjective:  Patient ID: Catherine [name], female    DOB: 1956-03-21  Age: 66 y.o. MRN: [id number]  CC: The primary encounter diagnosis was Acute bronchitis with COPD (HCC). Diagnoses of Mixed hyperlipidemia, Microalbuminuria due to type 2 diabetes mellitus (HCC), Primary hypertension, Anxiety, and Chronic hypoxemic respiratory failure (HCC) were also pertinent to this visit.   HPI Catherine [name] presents for folllow up on ER visit Chief Complaint  Patient presents with   Follow-up    ER follow up    1) 66 yr old female with chronic respiratory failure , secondary to advanced  COPD,  on  4 L 02  chronically ,  (not 3 L as reported by ER physician ) , present after an ER visit for suffered acute on chronic hypoxic respiratory failure.  On October 12 she noted increased dyspnea without  infectious symptoms:    Home sats on 4 L dropped to 70's and did not improve with home albuterol nebs.  She denies any recent URI or LRI symptoms: No cough or body aches  She was taken to ER by EMS and given solumedrol and nebulized albuterol  en route.    treated in ER on  Oct 12 for "COPD Exacerbation. "  (No 02 sats recorded in ED physician's notes except a resting sat after receiving treatment,  96% on 3 L .)  chest x ray done, no infiltrates.  . Given nebs and  solumedrol,  and sent home with prednisone and doxycycline .   She remains short of breath with minimal exertion ,  and desaturates  despite use of 4 L oxygen She has been  unable to leave the house without assistance .   She is unable to shower without the oxygen.   She has had several falls while standing and twisting,  due to losing her her balance.  2 falls occurred while asleep  in bed.  Her bed has no guard rails.  Has a hospital bed that elevates the head of but it is currently not able to be repositioned because it is broken.     it is  66-75 years old.   She has a bathtub/shower with no straddle seat.   She has no security bar next to the  commode she does have a security bar in the tub  .  Does not drive anymore. .daughter Verlon Au is 55 mintues away.  Sister and brother live 5 minutes away.does not have medic alert.  Uses the phone to contact family when in trouble.  They know that if she can't speak when she calls that she is in trouble.  Husband is home  by 3 pm  but she is alone during the day and sleeps most of the day  to avoid feeling alone. .  She is unable to cook  uses [employer]   or frozen dinners.  Has not had meals on wheel s.  .  Having trouble sleeping,  using alprazolam for insomnia only.  Not during the day   Based on her current condition , Catherine [name]  has mobility limitation due to  severe chronic respiratory failure that prevents her from cooking,  Cleaning and getting dressed .Marland Kitchen This cannot be resolved by use of a fitted cane or walker. This patient does not have sufficient lung  function, propel a wheelchair to perform mobility related ADL's. This patient has  stability of torso to remain sitting up without back support , therefore  A power  scooter would work.  She can safely transfer to and from a power scooter  and operate the controls.   He is Not getting home health currently.  [facility] has not  delivered oxygen in a while.   Ambulatory sats in the office today on 4 L/min note a drop from resting sat of 95% to 83%  , with restoration to 95% after 2 minutes of rest and deep breathing      Outpatient Medications Prior to Visit  Medication Sig Dispense Refill   albuterol (VENTOLIN HFA) 108 (90 Base) MCG/ACT inhaler Inhale 1-2 puffs into the lungs every 6 (six) hours as needed for wheezing or shortness of breath. 18 each 3   aspirin 81 MG EC tablet Take 81 mg by mouth daily.     Budeson-Glycopyrrol-Formoterol (BREZTRI AEROSPHERE) 160-9-4.8 MCG/ACT AERO Inhale 2 puffs into the lungs 2 (two) times daily. 32.1 g 3   buPROPion (WELLBUTRIN XL) 150 MG 24 hr tablet TAKE 1 TABLET BY MOUTH EVERY DAY 90 tablet 1    Cholecalciferol (VITAMIN D3) 125 MCG (5000 UT) CAPS Take 1,000 Units by mouth daily.      insulin aspart (NOVOLOG) 100 UNIT/ML injection Inject up to 15 units TID with meals 15 mL 0   ipratropium-albuterol (DUONEB) 0.5-2.5 (3) MG/3ML SOLN USE 1 VIAL BY NEBULIZER EVERY 6 HOURS AS NEEDED 360 mL 2   Lactobacillus (PROBIOTIC ACIDOPHILUS PO) Take 1 capsule by mouth daily.     lactulose (CHRONULAC) 10 GM/15ML solution TAKE 30 ML EVERY 4 HOURS UNTIL CONSTIPATION IS RELIEVED 473 mL 1   MAGNESIUM CITRATE PO Take 1 tablet by mouth daily.     Melatonin 5 MG TABS Take 5 mg by mouth at bedtime.     metFORMIN (GLUCOPHAGE) 1000 MG tablet TAKE 1 TABLET (1,000 MG TOTAL) BY MOUTH TWICE A DAY WITH FOOD 180 tablet 1   metoprolol succinate (TOPROL-XL) 25 MG 24 hr tablet TAKE 1 TABLET BY MOUTH EVERY DAY 90 tablet 3   PARoxetine (PAXIL) 20 MG tablet TAKE 1 TABLET BY MOUTH EVERY DAY IN THE MORNING 90 tablet 1   Potassium 99 MG TABS Take 99 mg by mouth daily. Reported on 09/20/2015     promethazine (PHENERGAN) 12.5 MG tablet TAKE 1 TABLET (12.5 MG TOTAL) BY MOUTH AT BEDTIME AS NEEDED FOR NAUSEA OR VOMITING. 30 tablet 0   rosuvastatin (CRESTOR) 40 MG tablet TAKE 1 TABLET BY MOUTH EVERY DAY 90 tablet 3   Semaglutide, 1 MG/DOSE, (OZEMPIC, 1 MG/DOSE,) 4 MG/3ML SOPN Inject 1 mg into the skin once a week. 9 mL 3   ALPRAZolam (XANAX) 0.25 MG tablet Take 1 tablet (0.25 mg total) by mouth at bedtime as needed for anxiety. 20 tablet 0   lisinopril (ZESTRIL) 20 MG tablet TAKE 1 TABLET BY MOUTH EVERY DAY 90 tablet 1   doxycycline (VIBRAMYCIN) 100 MG capsule Take 1 capsule (100 mg total) by mouth 2 (two) times daily. (Patient not taking: Reported on 07/11/2022) 28 capsule 0   No facility-administered medications prior to visit.    Review of Systems;  Patient denies headache, fevers, malaise, unintentional weight loss, skin rash, eye pain, sinus congestion and sinus pain, sore throat, dysphagia,  hemoptysis , cough, dyspnea,  wheezing, chest pain, palpitations, orthopnea, edema, abdominal pain, nausea, melena, diarrhea, constipation, flank pain, dysuria, hematuria, urinary  Frequency, nocturia, numbness, tingling, seizures,  Focal weakness, Loss of consciousness,  Tremor, insomnia, depression, anxiety, and suicidal ideation.      Objective:  BP (!) 158/86 (BP Location: Left Arm,  Patient Position: Sitting, Cuff Size: Large)   Temp 98 F (36.7 C) (Oral)   Ht 5\' 1"  (1.549 m)   Wt 237 lb 12.8 oz (107.9 kg)   SpO2 95% Comment: 4 L O2  BMI 44.93 kg/m   BP Readings from Last 3 Encounters:  07/11/22 (!) 158/86  03/04/22 122/88  12/13/20 (!) 162/96    Wt Readings from Last 3 Encounters:  07/11/22 237 lb 12.8 oz (107.9 kg)  03/04/22 244 lb 9.6 oz (110.9 kg)  10/23/21 264 lb (119.7 kg)    General appearance: alert, cooperative and appears stated age Ears: normal TM's and external ear canals both ears Throat: lips, mucosa, and tongue normal; teeth and gums normal Neck: no adenopathy, no carotid bruit, supple, symmetrical, trachea midline and thyroid not enlarged, symmetric, no tenderness/mass/nodules Back: symmetric, no curvature. ROM normal. No CVA tenderness. Lungs: clear to auscultation bilaterally Heart: regular rate and rhythm, S1, S2 normal, no murmur, click, rub or gallop Abdomen: soft, non-tender; bowel sounds normal; no masses,  no organomegaly Pulses: 2+ and symmetric Skin: Skin color, texture, turgor normal. No rashes or lesions Lymph nodes: Cervical, supraclavicular, and axillary nodes normal. Neuro:  awake and interactive with normal mood and affect. Higher cortical functions are normal. Speech is clear without word-finding difficulty or dysarthria. Extraocular movements are intact. Visual fields of both eyes are grossly intact. Sensation to light touch is grossly intact bilaterally of upper and lower extremities. Motor examination shows 4+/5 symmetric hand grip and upper extremity and 5/5 lower  extremity strength. There is no pronation or drift. Gait is non-ataxic   Lab Results  Component Value Date   HGBA1C 8.2 (H) 07/11/2022   HGBA1C 6.8 (H) 02/28/2022   HGBA1C 7.0 (H) 12/13/2020    Lab Results  Component Value Date   CREATININE 0.76 07/11/2022   CREATININE 0.93 06/19/2022   CREATININE 0.74 02/28/2022    Lab Results  Component Value Date   WBC 12.9 (H) 06/19/2022   HGB 13.7 06/19/2022   HCT 43.1 06/19/2022   PLT 283 06/19/2022   GLUCOSE 165 (H) 07/11/2022   CHOL 127 07/11/2022   TRIG 233.0 (H) 07/11/2022   HDL 43.50 07/11/2022   LDLDIRECT 52.0 07/11/2022   LDLCALC 45 12/13/2020   ALT 14 07/11/2022   AST 28 07/11/2022   NA 143 07/11/2022   K 4.0 07/11/2022   CL 98 07/11/2022   CREATININE 0.76 07/11/2022   BUN 11 07/11/2022   CO2 37 (H) 07/11/2022   TSH 1.10 01/12/2020   INR 0.9 07/22/2012   HGBA1C 8.2 (H) 07/11/2022   MICROALBUR 6.0 (H) 03/04/2022    ECHOCARDIOGRAM COMPLETE  Result Date: 01/20/2020    ECHOCARDIOGRAM REPORT   Patient Name:   [facility] PsDEBOR[facility] Date of Exam: 01/19/2020 Medical Rec #:  [id number]         Height:       63.0 in Accession #:    [id number]        Weight:       275.0 lb Date of Birth:  [birth year]        BSA:          2.214 m Patient Age:    63 years          BP:           172/95 mmHg Patient Gender: F                 HR:  81 bpm. Exam Location:  [facility] Procedure: 2D Echo, Color Doppler, Cardiac Doppler and Intracardiac            Opacification Agent Indications:     R00.2 Palpitations  History:         Patient has no prior history of Echocardiogram examinations.                  COPD; Risk Factors:Hypertension, Dyslipidemia, Diabetes and                  Former Smoker.  Sonographer:     Charmayne Sheer RDCS (AE) Referring Phys:  [id number] [place] Diagnosing Phys: Neoma Laming MD  Sonographer Comments: Technically difficult study due to poor echo windows. Image acquisition challenging due to patient body habitus and Image  acquisition challenging due to COPD. IMPRESSIONS  1. Left ventricular ejection fraction, by estimation, is 55 to 60%. The left ventricle has normal function. The left ventricle has no regional wall motion abnormalities. Left ventricular diastolic parameters are consistent with Grade I diastolic dysfunction (impaired relaxation).  2. Right ventricular systolic function is normal. The right ventricular size is normal.  3. Left atrial size was mildly dilated.  4. Right atrial size was mildly dilated.  5. The mitral valve was not assessed. No evidence of mitral valve regurgitation. No evidence of mitral stenosis.  6. The aortic valve is normal in structure. Aortic valve regurgitation is not visualized. No aortic stenosis is present.  7. The inferior vena cava is normal in size with greater than 50% respiratory variability, suggesting right atrial pressure of 3 mmHg. FINDINGS  Left Ventricle: Left ventricular ejection fraction, by estimation, is 55 to 60%. The left ventricle has normal function. The left ventricle has no regional wall motion abnormalities. Definity contrast agent was given IV to delineate the left ventricular  endocardial borders. The left ventricular internal cavity size was normal in size. There is no left ventricular hypertrophy. Left ventricular diastolic parameters are consistent with Grade I diastolic dysfunction (impaired relaxation). Right Ventricle: The right ventricular size is normal. No increase in right ventricular wall thickness. Right ventricular systolic function is normal. Left Atrium: Left atrial size was mildly dilated. Right Atrium: Right atrial size was mildly dilated. Pericardium: There is no evidence of pericardial effusion. Mitral Valve: The mitral valve was not assessed. Normal mobility of the mitral valve leaflets. No evidence of mitral valve regurgitation. No evidence of mitral valve stenosis. MV peak gradient, 5.9 mmHg. The mean mitral valve gradient is 2.0 mmHg. Tricuspid  Valve: The tricuspid valve is not assessed. Tricuspid valve regurgitation is not demonstrated. No evidence of tricuspid stenosis. Aortic Valve: The aortic valve is normal in structure. Aortic valve regurgitation is not visualized. No aortic stenosis is present. Aortic valve mean gradient measures 4.0 mmHg. Aortic valve peak gradient measures 8.6 mmHg. Aortic valve area, by VTI measures 3.88 cm. Pulmonic Valve: The pulmonic valve was not assessed. Pulmonic valve regurgitation is not visualized. No evidence of pulmonic stenosis. Aorta: The aortic root is normal in size and structure. Venous: The inferior vena cava is normal in size with greater than 50% respiratory variability, suggesting right atrial pressure of 3 mmHg. IAS/Shunts: No atrial level shunt detected by color flow Doppler.  LEFT VENTRICLE PLAX 2D LVIDd:         4.39 cm  Diastology LVIDs:         3.47 cm  LV e' lateral:   6.53 cm/s LV PW:  1.15 cm  LV E/e' lateral: 13.0 LV IVS:        1.07 cm  LV e' medial:    7.72 cm/s LVOT diam:     2.30 cm  LV E/e' medial:  11.0 LV SV:         87 LV SV Index:   39 LVOT Area:     4.15 cm  LEFT ATRIUM           Index LA diam:      4.10 cm 1.85 cm/m LA Vol (A4C): 26.6 ml 12.02 ml/m  AORTIC VALVE                   PULMONIC VALVE AV Area (Vmax):    3.02 cm    PV Vmax:       0.96 m/s AV Area (Vmean):   3.33 cm    PV Vmean:      59.800 cm/s AV Area (VTI):     3.88 cm    PV VTI:        0.138 m AV Vmax:           147.00 cm/s PV Peak grad:  3.7 mmHg AV Vmean:          97.600 cm/s PV Mean grad:  2.0 mmHg AV VTI:            0.224 m AV Peak Grad:      8.6 mmHg AV Mean Grad:      4.0 mmHg LVOT Vmax:         107.00 cm/s LVOT Vmean:        78.200 cm/s LVOT VTI:          0.209 m LVOT/AV VTI ratio: 0.93  AORTA Ao Root diam: 3.30 cm MITRAL VALVE MV Area (PHT): 4.76 cm    SHUNTS MV Peak grad:  5.9 mmHg    Systemic VTI:  0.21 m MV Mean grad:  2.0 mmHg    Systemic Diam: 2.30 cm MV Vmax:       1.21 m/s MV Vmean:      68.5 cm/s  MV Decel Time: 159 msec MV E velocity: 85.13 cm/s MV A velocity: 91.83 cm/s MV E/A ratio:  0.93 Adrian Blackwater MD Electronically signed by Adrian Blackwater MD Signature Date/Time: 01/20/2020/12:47:16 PM    Final    CT Angio Chest PE W and/or Wo Contrast  Result Date: 01/19/2020 CLINICAL DATA:  High probability for pulmonary embolism. Palpitations and shortness of breath EXAM: CT ANGIOGRAPHY CHEST WITH CONTRAST TECHNIQUE: Multidetector CT imaging of the chest was performed using the standard protocol during bolus administration of intravenous contrast. Multiplanar CT image reconstructions and MIPs were obtained to evaluate the vascular anatomy. CONTRAST:  OMNIPAQUE IOHEXOL 350 MG/ML SOLN COMPARISON:  Chest CT 06/03/2018 FINDINGS: Cardiovascular: Normal heart size. No pericardial effusion. No acute aortic finding. Suboptimal pulmonary artery opacification, 170 HU at the main pulmonary artery bifurcation, exacerbated by body habitus. No visible pulmonary embolism but very limited beyond the lobar to proximal segmental level. Mediastinum/Nodes: No adenopathy or mass. Lungs/Pleura: Mild centrilobular emphysema and airway thickening. Numerous pulmonary nodules measuring less than 1 cm. Most notable is clustered nodules at the left base. There has been no progression since 2019, at which time these nodules were also reported as stable. Mild atelectasis. Upper Abdomen: Negative Musculoskeletal: Thoracic spondylosis and degenerative disc narrowing Review of the MIP images confirms the above findings. IMPRESSION: 1. No evidence of pulmonary embolism. Bolus timing and body habitus  limits pulmonary artery visualization beyond the lobar to proximal segmental levels. 2. Numerous pulmonary nodules that are stable for years and considered benign. 3. COPD. Electronically Signed   By: Marnee Spring M.D.   On: 01/19/2020 05:48   DG Chest Portable 1 View  Result Date: 01/19/2020 CLINICAL DATA:  Shortness of breath and chest  palpitations EXAM: PORTABLE CHEST 1 VIEW COMPARISON:  01/02/2016 FINDINGS: Interstitial coarsening correlating with COPD history. No change from prior. There is no edema, consolidation, effusion, or pneumothorax. Normal heart size and mediastinal contours. IMPRESSION: No acute finding when compared to prior. Chronic bronchitic markings. Electronically Signed   By: Marnee Spring M.D.   On: 01/19/2020 04:19    Assessment & Plan:   Problem List Items Addressed This Visit     HTN (hypertension)   Relevant Medications   losartan (COZAAR) 100 MG tablet   Hyperlipidemia   Relevant Medications   losartan (COZAAR) 100 MG tablet   Anxiety    Managed with paxil  And prn alprazolam for prn use . The risks and benefits of benzodiazepine use were reviewed with patient today including excessive sedation leading to respiratory depression,  impaired thinking/driving, and addiction.  Patient was advised to avoid concurrent use with alcohol, to use medication only as needed and not to share with others  .       Relevant Medications   ALPRAZolam (XANAX) 0.25 MG tablet   Chronic hypoxemic respiratory failure (HCC)    She has become dyspneic and hypoxic with minimal exertion despite use of 4 L./min supplemental oxygen. She now has mobility limitation due to   chronic hypoxia that prevents aher from ccomplishing ADL's. This cannot be resolved by use of a fitted cane or walker. This patient does not have sufficient  lung function to propel a wheelchair to perform mobility related ADL's. This patient could benefit from use of a motorized scooter  and can safely transfer to and from a scooter and operate the controls.       Microalbuminuria due to type 2 diabetes mellitus (HCC)   Relevant Medications   losartan (COZAAR) 100 MG tablet   Other Visit Diagnoses     Acute bronchitis with COPD (HCC)    -  Primary   Relevant Medications   predniSONE (DELTASONE) 10 MG tablet   Other Relevant Orders   Ambulatory  referral to Home Health       I spent a total of 40   minutes with this patient in a face to face visit on the date of this encounter reviewing the last office visit with me, her recent ER visit including labs and imaging studies ,   and post visit ordering of testing and therapeutics.    Follow-up: No follow-ups on file.   Sherlene Shams, MD

## 2022-07-11 NOTE — Patient Instructions (Addendum)
You can stop the wellbutrin if you think it is not helping.  It may be making you more anxious  Continue the paxil  I an changing lisinopril to losartan to improve your blood pressure   I am making a referral for home health to help you make your home a little safer,  I am going to  try to obtain authorization for a motorized scooter   The highest you can go on the alprazolam is 1.5 tablets,  higher doses may reduce your breathing

## 2022-07-12 NOTE — Assessment & Plan Note (Signed)
She has become dyspneic and hypoxic with minimal exertion despite use of 4 L./min supplemental oxygen. She now has mobility limitation due to   chronic hypoxia that prevents aher from ccomplishing ADL's. This cannot be resolved by use of a fitted cane or walker. This patient does not have sufficient  lung function to propel a wheelchair to perform mobility related ADL's. This patient could benefit from use of a motorized scooter  and can safely transfer to and from a scooter and operate the controls.

## 2022-07-12 NOTE — Assessment & Plan Note (Signed)
Managed with paxil  And prn alprazolam for prn use . The risks and benefits of benzodiazepine use were reviewed with patient today including excessive sedation leading to respiratory depression,  impaired thinking/driving, and addiction.  Patient was advised to avoid concurrent use with alcohol, to use medication only as needed and not to share with others  .

## 2022-07-13 DIAGNOSIS — R809 Proteinuria, unspecified: Secondary | ICD-10-CM | POA: Diagnosis not present

## 2022-07-13 DIAGNOSIS — J9621 Acute and chronic respiratory failure with hypoxia: Secondary | ICD-10-CM | POA: Diagnosis not present

## 2022-07-13 DIAGNOSIS — G4700 Insomnia, unspecified: Secondary | ICD-10-CM | POA: Diagnosis not present

## 2022-07-13 DIAGNOSIS — E1169 Type 2 diabetes mellitus with other specified complication: Secondary | ICD-10-CM | POA: Diagnosis not present

## 2022-07-13 DIAGNOSIS — D649 Anemia, unspecified: Secondary | ICD-10-CM | POA: Diagnosis not present

## 2022-07-13 DIAGNOSIS — Z8701 Personal history of pneumonia (recurrent): Secondary | ICD-10-CM | POA: Diagnosis not present

## 2022-07-13 DIAGNOSIS — J449 Chronic obstructive pulmonary disease, unspecified: Secondary | ICD-10-CM | POA: Diagnosis not present

## 2022-07-13 DIAGNOSIS — I2510 Atherosclerotic heart disease of native coronary artery without angina pectoris: Secondary | ICD-10-CM | POA: Diagnosis not present

## 2022-07-13 DIAGNOSIS — Z6841 Body Mass Index (BMI) 40.0 and over, adult: Secondary | ICD-10-CM | POA: Diagnosis not present

## 2022-07-13 DIAGNOSIS — K219 Gastro-esophageal reflux disease without esophagitis: Secondary | ICD-10-CM | POA: Diagnosis not present

## 2022-07-13 DIAGNOSIS — I10 Essential (primary) hypertension: Secondary | ICD-10-CM | POA: Diagnosis not present

## 2022-07-13 DIAGNOSIS — Z9181 History of falling: Secondary | ICD-10-CM | POA: Diagnosis not present

## 2022-07-13 DIAGNOSIS — E782 Mixed hyperlipidemia: Secondary | ICD-10-CM | POA: Diagnosis not present

## 2022-07-13 DIAGNOSIS — Z9981 Dependence on supplemental oxygen: Secondary | ICD-10-CM | POA: Diagnosis not present

## 2022-07-13 DIAGNOSIS — R296 Repeated falls: Secondary | ICD-10-CM | POA: Diagnosis not present

## 2022-07-14 ENCOUNTER — Other Ambulatory Visit: Payer: Self-pay | Admitting: Internal Medicine

## 2022-07-14 DIAGNOSIS — E782 Mixed hyperlipidemia: Secondary | ICD-10-CM | POA: Diagnosis not present

## 2022-07-14 DIAGNOSIS — I2510 Atherosclerotic heart disease of native coronary artery without angina pectoris: Secondary | ICD-10-CM | POA: Diagnosis not present

## 2022-07-14 DIAGNOSIS — Z9981 Dependence on supplemental oxygen: Secondary | ICD-10-CM | POA: Diagnosis not present

## 2022-07-14 DIAGNOSIS — Z9181 History of falling: Secondary | ICD-10-CM | POA: Diagnosis not present

## 2022-07-14 DIAGNOSIS — R296 Repeated falls: Secondary | ICD-10-CM | POA: Diagnosis not present

## 2022-07-14 DIAGNOSIS — I10 Essential (primary) hypertension: Secondary | ICD-10-CM | POA: Diagnosis not present

## 2022-07-14 DIAGNOSIS — D649 Anemia, unspecified: Secondary | ICD-10-CM | POA: Diagnosis not present

## 2022-07-14 DIAGNOSIS — J449 Chronic obstructive pulmonary disease, unspecified: Secondary | ICD-10-CM | POA: Diagnosis not present

## 2022-07-14 DIAGNOSIS — G4700 Insomnia, unspecified: Secondary | ICD-10-CM | POA: Diagnosis not present

## 2022-07-14 DIAGNOSIS — Z8701 Personal history of pneumonia (recurrent): Secondary | ICD-10-CM | POA: Diagnosis not present

## 2022-07-14 DIAGNOSIS — K219 Gastro-esophageal reflux disease without esophagitis: Secondary | ICD-10-CM | POA: Diagnosis not present

## 2022-07-14 DIAGNOSIS — J9621 Acute and chronic respiratory failure with hypoxia: Secondary | ICD-10-CM | POA: Diagnosis not present

## 2022-07-14 DIAGNOSIS — Z6841 Body Mass Index (BMI) 40.0 and over, adult: Secondary | ICD-10-CM | POA: Diagnosis not present

## 2022-07-14 DIAGNOSIS — R809 Proteinuria, unspecified: Secondary | ICD-10-CM | POA: Diagnosis not present

## 2022-07-14 DIAGNOSIS — E1169 Type 2 diabetes mellitus with other specified complication: Secondary | ICD-10-CM | POA: Diagnosis not present

## 2022-07-16 DIAGNOSIS — J449 Chronic obstructive pulmonary disease, unspecified: Secondary | ICD-10-CM | POA: Diagnosis not present

## 2022-07-16 DIAGNOSIS — Z8701 Personal history of pneumonia (recurrent): Secondary | ICD-10-CM | POA: Diagnosis not present

## 2022-07-16 DIAGNOSIS — R809 Proteinuria, unspecified: Secondary | ICD-10-CM | POA: Diagnosis not present

## 2022-07-16 DIAGNOSIS — R296 Repeated falls: Secondary | ICD-10-CM | POA: Diagnosis not present

## 2022-07-16 DIAGNOSIS — Z6841 Body Mass Index (BMI) 40.0 and over, adult: Secondary | ICD-10-CM | POA: Diagnosis not present

## 2022-07-16 DIAGNOSIS — G4700 Insomnia, unspecified: Secondary | ICD-10-CM | POA: Diagnosis not present

## 2022-07-16 DIAGNOSIS — E1169 Type 2 diabetes mellitus with other specified complication: Secondary | ICD-10-CM | POA: Diagnosis not present

## 2022-07-16 DIAGNOSIS — K219 Gastro-esophageal reflux disease without esophagitis: Secondary | ICD-10-CM | POA: Diagnosis not present

## 2022-07-16 DIAGNOSIS — E782 Mixed hyperlipidemia: Secondary | ICD-10-CM | POA: Diagnosis not present

## 2022-07-16 DIAGNOSIS — Z9181 History of falling: Secondary | ICD-10-CM | POA: Diagnosis not present

## 2022-07-16 DIAGNOSIS — I2510 Atherosclerotic heart disease of native coronary artery without angina pectoris: Secondary | ICD-10-CM | POA: Diagnosis not present

## 2022-07-16 DIAGNOSIS — Z9981 Dependence on supplemental oxygen: Secondary | ICD-10-CM | POA: Diagnosis not present

## 2022-07-16 DIAGNOSIS — I10 Essential (primary) hypertension: Secondary | ICD-10-CM | POA: Diagnosis not present

## 2022-07-16 DIAGNOSIS — J9621 Acute and chronic respiratory failure with hypoxia: Secondary | ICD-10-CM | POA: Diagnosis not present

## 2022-07-16 DIAGNOSIS — D649 Anemia, unspecified: Secondary | ICD-10-CM | POA: Diagnosis not present

## 2022-07-17 DIAGNOSIS — E782 Mixed hyperlipidemia: Secondary | ICD-10-CM | POA: Diagnosis not present

## 2022-07-17 DIAGNOSIS — J449 Chronic obstructive pulmonary disease, unspecified: Secondary | ICD-10-CM | POA: Diagnosis not present

## 2022-07-17 DIAGNOSIS — D649 Anemia, unspecified: Secondary | ICD-10-CM | POA: Diagnosis not present

## 2022-07-17 DIAGNOSIS — Z8701 Personal history of pneumonia (recurrent): Secondary | ICD-10-CM | POA: Diagnosis not present

## 2022-07-17 DIAGNOSIS — E1169 Type 2 diabetes mellitus with other specified complication: Secondary | ICD-10-CM | POA: Diagnosis not present

## 2022-07-17 DIAGNOSIS — Z9181 History of falling: Secondary | ICD-10-CM | POA: Diagnosis not present

## 2022-07-17 DIAGNOSIS — K219 Gastro-esophageal reflux disease without esophagitis: Secondary | ICD-10-CM | POA: Diagnosis not present

## 2022-07-17 DIAGNOSIS — R296 Repeated falls: Secondary | ICD-10-CM | POA: Diagnosis not present

## 2022-07-17 DIAGNOSIS — J9621 Acute and chronic respiratory failure with hypoxia: Secondary | ICD-10-CM | POA: Diagnosis not present

## 2022-07-17 DIAGNOSIS — Z6841 Body Mass Index (BMI) 40.0 and over, adult: Secondary | ICD-10-CM | POA: Diagnosis not present

## 2022-07-17 DIAGNOSIS — I10 Essential (primary) hypertension: Secondary | ICD-10-CM | POA: Diagnosis not present

## 2022-07-17 DIAGNOSIS — G4700 Insomnia, unspecified: Secondary | ICD-10-CM | POA: Diagnosis not present

## 2022-07-17 DIAGNOSIS — R809 Proteinuria, unspecified: Secondary | ICD-10-CM | POA: Diagnosis not present

## 2022-07-17 DIAGNOSIS — I2510 Atherosclerotic heart disease of native coronary artery without angina pectoris: Secondary | ICD-10-CM | POA: Diagnosis not present

## 2022-07-17 DIAGNOSIS — Z9981 Dependence on supplemental oxygen: Secondary | ICD-10-CM | POA: Diagnosis not present

## 2022-07-21 DIAGNOSIS — D649 Anemia, unspecified: Secondary | ICD-10-CM | POA: Diagnosis not present

## 2022-07-21 DIAGNOSIS — Z9981 Dependence on supplemental oxygen: Secondary | ICD-10-CM | POA: Diagnosis not present

## 2022-07-21 DIAGNOSIS — R809 Proteinuria, unspecified: Secondary | ICD-10-CM | POA: Diagnosis not present

## 2022-07-21 DIAGNOSIS — J449 Chronic obstructive pulmonary disease, unspecified: Secondary | ICD-10-CM | POA: Diagnosis not present

## 2022-07-21 DIAGNOSIS — Z6841 Body Mass Index (BMI) 40.0 and over, adult: Secondary | ICD-10-CM | POA: Diagnosis not present

## 2022-07-21 DIAGNOSIS — I2510 Atherosclerotic heart disease of native coronary artery without angina pectoris: Secondary | ICD-10-CM | POA: Diagnosis not present

## 2022-07-21 DIAGNOSIS — R296 Repeated falls: Secondary | ICD-10-CM | POA: Diagnosis not present

## 2022-07-21 DIAGNOSIS — K219 Gastro-esophageal reflux disease without esophagitis: Secondary | ICD-10-CM | POA: Diagnosis not present

## 2022-07-21 DIAGNOSIS — Z8701 Personal history of pneumonia (recurrent): Secondary | ICD-10-CM | POA: Diagnosis not present

## 2022-07-21 DIAGNOSIS — G4700 Insomnia, unspecified: Secondary | ICD-10-CM | POA: Diagnosis not present

## 2022-07-21 DIAGNOSIS — J9621 Acute and chronic respiratory failure with hypoxia: Secondary | ICD-10-CM | POA: Diagnosis not present

## 2022-07-21 DIAGNOSIS — E782 Mixed hyperlipidemia: Secondary | ICD-10-CM | POA: Diagnosis not present

## 2022-07-21 DIAGNOSIS — E1169 Type 2 diabetes mellitus with other specified complication: Secondary | ICD-10-CM | POA: Diagnosis not present

## 2022-07-21 DIAGNOSIS — Z9181 History of falling: Secondary | ICD-10-CM | POA: Diagnosis not present

## 2022-07-21 DIAGNOSIS — I10 Essential (primary) hypertension: Secondary | ICD-10-CM | POA: Diagnosis not present

## 2022-07-22 ENCOUNTER — Telehealth: Payer: Self-pay

## 2022-07-22 DIAGNOSIS — I2510 Atherosclerotic heart disease of native coronary artery without angina pectoris: Secondary | ICD-10-CM | POA: Diagnosis not present

## 2022-07-22 DIAGNOSIS — G4700 Insomnia, unspecified: Secondary | ICD-10-CM | POA: Diagnosis not present

## 2022-07-22 DIAGNOSIS — Z9181 History of falling: Secondary | ICD-10-CM | POA: Diagnosis not present

## 2022-07-22 DIAGNOSIS — Z6841 Body Mass Index (BMI) 40.0 and over, adult: Secondary | ICD-10-CM | POA: Diagnosis not present

## 2022-07-22 DIAGNOSIS — J449 Chronic obstructive pulmonary disease, unspecified: Secondary | ICD-10-CM | POA: Diagnosis not present

## 2022-07-22 DIAGNOSIS — Z8701 Personal history of pneumonia (recurrent): Secondary | ICD-10-CM | POA: Diagnosis not present

## 2022-07-22 DIAGNOSIS — J9621 Acute and chronic respiratory failure with hypoxia: Secondary | ICD-10-CM | POA: Diagnosis not present

## 2022-07-22 DIAGNOSIS — R296 Repeated falls: Secondary | ICD-10-CM | POA: Diagnosis not present

## 2022-07-22 DIAGNOSIS — E782 Mixed hyperlipidemia: Secondary | ICD-10-CM | POA: Diagnosis not present

## 2022-07-22 DIAGNOSIS — I10 Essential (primary) hypertension: Secondary | ICD-10-CM | POA: Diagnosis not present

## 2022-07-22 DIAGNOSIS — Z9981 Dependence on supplemental oxygen: Secondary | ICD-10-CM | POA: Diagnosis not present

## 2022-07-22 DIAGNOSIS — R809 Proteinuria, unspecified: Secondary | ICD-10-CM | POA: Diagnosis not present

## 2022-07-22 DIAGNOSIS — K219 Gastro-esophageal reflux disease without esophagitis: Secondary | ICD-10-CM | POA: Diagnosis not present

## 2022-07-22 DIAGNOSIS — D649 Anemia, unspecified: Secondary | ICD-10-CM | POA: Diagnosis not present

## 2022-07-22 DIAGNOSIS — E1169 Type 2 diabetes mellitus with other specified complication: Secondary | ICD-10-CM | POA: Diagnosis not present

## 2022-07-22 MED ORDER — HYDROCHLOROTHIAZIDE 25 MG PO TABS: 25.0000 mg | ORAL_TABLET | Freq: Every day | ORAL | 3 refills | Status: DC

## 2022-07-22 NOTE — Telephone Encounter (Signed)
Catherine [name] called from [facility] to state she saw patient today and her blood pressure was 162/96 (outside of their parameters).  Darl Pikes states if we would like to change the parameters, then just send her an order.  Darl Pikes states she will be willing to help with evaluation for power wheelchair if we would like.  Darl Pikes states she would like to know if patient has had her shingles vaccine, and if so, can we fax the documentation to her at [phone].  Darl Pikes states patient's oxygen equipment has not been serviced recently and she asked her to call [facility] to have someone come service it.  Darl Pikes states patient has empty tanks on front porch with spider webbs and the concentrator patient was using was dirty.

## 2022-07-22 NOTE — Addendum Note (Signed)
Addended by: Sherlene Shams on: 07/22/2022 05:54 PM   Modules accepted: Orders

## 2022-07-23 NOTE — Telephone Encounter (Signed)
Pt and home health nurse are aware of the medication that has been added to the bp regimen. They gave verbal understanding.

## 2022-07-24 DIAGNOSIS — Z9981 Dependence on supplemental oxygen: Secondary | ICD-10-CM

## 2022-07-24 DIAGNOSIS — E1169 Type 2 diabetes mellitus with other specified complication: Secondary | ICD-10-CM

## 2022-07-24 DIAGNOSIS — J449 Chronic obstructive pulmonary disease, unspecified: Secondary | ICD-10-CM

## 2022-07-24 DIAGNOSIS — D649 Anemia, unspecified: Secondary | ICD-10-CM

## 2022-07-24 DIAGNOSIS — J9621 Acute and chronic respiratory failure with hypoxia: Secondary | ICD-10-CM

## 2022-07-24 DIAGNOSIS — K219 Gastro-esophageal reflux disease without esophagitis: Secondary | ICD-10-CM

## 2022-07-24 DIAGNOSIS — Z6841 Body Mass Index (BMI) 40.0 and over, adult: Secondary | ICD-10-CM

## 2022-07-24 DIAGNOSIS — I10 Essential (primary) hypertension: Secondary | ICD-10-CM

## 2022-07-24 DIAGNOSIS — R296 Repeated falls: Secondary | ICD-10-CM

## 2022-07-24 DIAGNOSIS — Z8701 Personal history of pneumonia (recurrent): Secondary | ICD-10-CM

## 2022-07-24 DIAGNOSIS — E782 Mixed hyperlipidemia: Secondary | ICD-10-CM

## 2022-07-24 DIAGNOSIS — Z9181 History of falling: Secondary | ICD-10-CM

## 2022-07-24 DIAGNOSIS — G4700 Insomnia, unspecified: Secondary | ICD-10-CM

## 2022-07-24 DIAGNOSIS — I2510 Atherosclerotic heart disease of native coronary artery without angina pectoris: Secondary | ICD-10-CM

## 2022-07-24 DIAGNOSIS — R809 Proteinuria, unspecified: Secondary | ICD-10-CM

## 2022-07-25 ENCOUNTER — Telehealth: Payer: Self-pay

## 2022-07-25 DIAGNOSIS — R809 Proteinuria, unspecified: Secondary | ICD-10-CM | POA: Diagnosis not present

## 2022-07-25 DIAGNOSIS — Z6841 Body Mass Index (BMI) 40.0 and over, adult: Secondary | ICD-10-CM | POA: Diagnosis not present

## 2022-07-25 DIAGNOSIS — I2510 Atherosclerotic heart disease of native coronary artery without angina pectoris: Secondary | ICD-10-CM | POA: Diagnosis not present

## 2022-07-25 DIAGNOSIS — J9621 Acute and chronic respiratory failure with hypoxia: Secondary | ICD-10-CM | POA: Diagnosis not present

## 2022-07-25 DIAGNOSIS — I10 Essential (primary) hypertension: Secondary | ICD-10-CM | POA: Diagnosis not present

## 2022-07-25 DIAGNOSIS — J449 Chronic obstructive pulmonary disease, unspecified: Secondary | ICD-10-CM | POA: Diagnosis not present

## 2022-07-25 DIAGNOSIS — G4700 Insomnia, unspecified: Secondary | ICD-10-CM | POA: Diagnosis not present

## 2022-07-25 DIAGNOSIS — E782 Mixed hyperlipidemia: Secondary | ICD-10-CM | POA: Diagnosis not present

## 2022-07-25 DIAGNOSIS — Z8701 Personal history of pneumonia (recurrent): Secondary | ICD-10-CM | POA: Diagnosis not present

## 2022-07-25 DIAGNOSIS — D649 Anemia, unspecified: Secondary | ICD-10-CM | POA: Diagnosis not present

## 2022-07-25 DIAGNOSIS — R296 Repeated falls: Secondary | ICD-10-CM | POA: Diagnosis not present

## 2022-07-25 DIAGNOSIS — Z9181 History of falling: Secondary | ICD-10-CM | POA: Diagnosis not present

## 2022-07-25 DIAGNOSIS — E1169 Type 2 diabetes mellitus with other specified complication: Secondary | ICD-10-CM | POA: Diagnosis not present

## 2022-07-25 DIAGNOSIS — Z9981 Dependence on supplemental oxygen: Secondary | ICD-10-CM | POA: Diagnosis not present

## 2022-07-25 DIAGNOSIS — K219 Gastro-esophageal reflux disease without esophagitis: Secondary | ICD-10-CM | POA: Diagnosis not present

## 2022-07-25 NOTE — Telephone Encounter (Signed)
Catherine [name] called from [facility] to state patient's blood pressure is still elevated.  Darl Pikes states it is 168/84 just now.  Darl Pikes states patient started hydrochlorothiazide (HYDRODIURIL) 25 MG tablet yesterday or this morning.

## 2022-07-26 DIAGNOSIS — J449 Chronic obstructive pulmonary disease, unspecified: Secondary | ICD-10-CM | POA: Diagnosis not present

## 2022-07-26 DIAGNOSIS — G839 Paralytic syndrome, unspecified: Secondary | ICD-10-CM | POA: Diagnosis not present

## 2022-07-28 DIAGNOSIS — D649 Anemia, unspecified: Secondary | ICD-10-CM | POA: Diagnosis not present

## 2022-07-28 DIAGNOSIS — K219 Gastro-esophageal reflux disease without esophagitis: Secondary | ICD-10-CM | POA: Diagnosis not present

## 2022-07-28 DIAGNOSIS — Z8701 Personal history of pneumonia (recurrent): Secondary | ICD-10-CM | POA: Diagnosis not present

## 2022-07-28 DIAGNOSIS — Z6841 Body Mass Index (BMI) 40.0 and over, adult: Secondary | ICD-10-CM | POA: Diagnosis not present

## 2022-07-28 DIAGNOSIS — I10 Essential (primary) hypertension: Secondary | ICD-10-CM | POA: Diagnosis not present

## 2022-07-28 DIAGNOSIS — J449 Chronic obstructive pulmonary disease, unspecified: Secondary | ICD-10-CM | POA: Diagnosis not present

## 2022-07-28 DIAGNOSIS — E782 Mixed hyperlipidemia: Secondary | ICD-10-CM | POA: Diagnosis not present

## 2022-07-28 DIAGNOSIS — R296 Repeated falls: Secondary | ICD-10-CM | POA: Diagnosis not present

## 2022-07-28 DIAGNOSIS — E1169 Type 2 diabetes mellitus with other specified complication: Secondary | ICD-10-CM | POA: Diagnosis not present

## 2022-07-28 DIAGNOSIS — Z9981 Dependence on supplemental oxygen: Secondary | ICD-10-CM | POA: Diagnosis not present

## 2022-07-28 DIAGNOSIS — Z9181 History of falling: Secondary | ICD-10-CM | POA: Diagnosis not present

## 2022-07-28 DIAGNOSIS — G4700 Insomnia, unspecified: Secondary | ICD-10-CM | POA: Diagnosis not present

## 2022-07-28 DIAGNOSIS — I2510 Atherosclerotic heart disease of native coronary artery without angina pectoris: Secondary | ICD-10-CM | POA: Diagnosis not present

## 2022-07-28 DIAGNOSIS — R809 Proteinuria, unspecified: Secondary | ICD-10-CM | POA: Diagnosis not present

## 2022-07-28 DIAGNOSIS — J9621 Acute and chronic respiratory failure with hypoxia: Secondary | ICD-10-CM | POA: Diagnosis not present

## 2022-07-28 NOTE — Telephone Encounter (Signed)
Catherine [name] is aware that Dr. Darrick Huntsman would like a reading a week after starting the new bp medication. However the office will be closed on Friday so is it okay for them to call on Monday with the reading?

## 2022-07-29 NOTE — Telephone Encounter (Signed)
Darl Pikes, home health nurse is aware.

## 2022-07-30 ENCOUNTER — Telehealth: Payer: Self-pay | Admitting: Internal Medicine

## 2022-07-30 NOTE — Telephone Encounter (Signed)
She was prescribed prednisone taper on Nov 3, so they have probably been high for the past 2 weeks,  not days!   It would be nice if the access nurse would provide more information  Tell Catherine [name] to increase her novolog mealtime doses by 3  units until her 2 hour post prandials are < 180 , then resume her usual 16 units.

## 2022-07-30 NOTE — Telephone Encounter (Signed)
FYI

## 2022-07-30 NOTE — Telephone Encounter (Signed)
[place] called from Access Nurse called back with recommendation for patient to call her PCP now.  Patient is on insulin, she took novalog 16 units at 2:45pm.  Access Nurse states patient's last blood sugar was 324 at 3:16pm.  I spoke with Sandy Salaam, CMA, and she states Dr. Duncan Dull is in with a patient at the moment.  [name] states she will call patient back when Dr. Darrick Huntsman is available.  I relayed message to patient.

## 2022-07-30 NOTE — Telephone Encounter (Signed)
Pt called stating her blood sugar has been over 300 for the past two days. Sent to access nurse

## 2022-07-30 NOTE — Telephone Encounter (Signed)
Spoke with pt and informed her of the message below. Pt stated that she is currently taking 18 units of novolog TID. Pt gave a verbal understanding to all instructions.

## 2022-07-30 NOTE — Telephone Encounter (Signed)
Darl Pikes from [facility] called stating pt declined visit due to her being nauseated. Pt stated her BP is better today

## 2022-08-01 DIAGNOSIS — E1165 Type 2 diabetes mellitus with hyperglycemia: Secondary | ICD-10-CM | POA: Diagnosis not present

## 2022-08-05 ENCOUNTER — Telehealth: Payer: Self-pay

## 2022-08-05 NOTE — Telephone Encounter (Signed)
Patient states her blood sugar is still high and she would like to know what to do.

## 2022-08-06 ENCOUNTER — Other Ambulatory Visit: Payer: Self-pay | Admitting: Internal Medicine

## 2022-08-06 MED ORDER — INSULIN ASPART 100 UNIT/ML IJ SOLN: 24.0000 [IU] | Freq: Three times a day (TID) | INTRAMUSCULAR | 99 refills | Status: DC

## 2022-08-06 NOTE — Telephone Encounter (Signed)
Pt is aware to increase to 24 units with meals and scheduled a 1 week follow up.

## 2022-08-06 NOTE — Telephone Encounter (Signed)
Spoke with pt and she stated that since increasing her insulin to 21 units TID her sugars have come down but are still in the 200s. Pt stated that she completed the prednisone about 1 week ago.

## 2022-08-07 ENCOUNTER — Telehealth: Payer: Self-pay

## 2022-08-07 DIAGNOSIS — J9621 Acute and chronic respiratory failure with hypoxia: Secondary | ICD-10-CM | POA: Diagnosis not present

## 2022-08-07 DIAGNOSIS — R296 Repeated falls: Secondary | ICD-10-CM | POA: Diagnosis not present

## 2022-08-07 DIAGNOSIS — G4700 Insomnia, unspecified: Secondary | ICD-10-CM | POA: Diagnosis not present

## 2022-08-07 DIAGNOSIS — E1169 Type 2 diabetes mellitus with other specified complication: Secondary | ICD-10-CM | POA: Diagnosis not present

## 2022-08-07 DIAGNOSIS — Z6841 Body Mass Index (BMI) 40.0 and over, adult: Secondary | ICD-10-CM | POA: Diagnosis not present

## 2022-08-07 DIAGNOSIS — R809 Proteinuria, unspecified: Secondary | ICD-10-CM | POA: Diagnosis not present

## 2022-08-07 DIAGNOSIS — Z9181 History of falling: Secondary | ICD-10-CM | POA: Diagnosis not present

## 2022-08-07 DIAGNOSIS — E782 Mixed hyperlipidemia: Secondary | ICD-10-CM | POA: Diagnosis not present

## 2022-08-07 DIAGNOSIS — K219 Gastro-esophageal reflux disease without esophagitis: Secondary | ICD-10-CM | POA: Diagnosis not present

## 2022-08-07 DIAGNOSIS — D649 Anemia, unspecified: Secondary | ICD-10-CM | POA: Diagnosis not present

## 2022-08-07 DIAGNOSIS — Z9981 Dependence on supplemental oxygen: Secondary | ICD-10-CM | POA: Diagnosis not present

## 2022-08-07 DIAGNOSIS — Z8701 Personal history of pneumonia (recurrent): Secondary | ICD-10-CM | POA: Diagnosis not present

## 2022-08-07 DIAGNOSIS — I10 Essential (primary) hypertension: Secondary | ICD-10-CM | POA: Diagnosis not present

## 2022-08-07 DIAGNOSIS — J449 Chronic obstructive pulmonary disease, unspecified: Secondary | ICD-10-CM | POA: Diagnosis not present

## 2022-08-07 DIAGNOSIS — I2510 Atherosclerotic heart disease of native coronary artery without angina pectoris: Secondary | ICD-10-CM | POA: Diagnosis not present

## 2022-08-07 NOTE — Telephone Encounter (Signed)
I discovered that we should contact Doc of the Day instead of Access Nurse, so I spoke with Charlyne Mom, CMA, who is with Dr. Dana Allan, who is our Doc of the Day.  Coralee North states she will speak with Dr. Clent Ridges and call Luther Parody, Occupational Therapist from [facility], at [phone].  Coralee North asked me to call Luther Parody to let her know she will be in touch with her.  I spoke with Luther Parody and let her know that Coralee North will be calling her after she speaks with Dr. Clent Ridges.  Luther Parody states she just found out that patient did not take her metoprolol succinate (TOPROL-XL) 25 MG 24 hr tablet today.  I did call Coralee North to let her know this new piece of information.

## 2022-08-07 NOTE — Telephone Encounter (Signed)
Caitlin, Acupuncturist from [facility], is calling to state that she is with patient right now and patient's resting heart rate is 114 and it's flagging Caitlin to call us to see what she should do.  I transferred call to Access Nurse.

## 2022-08-08 ENCOUNTER — Other Ambulatory Visit: Payer: Self-pay

## 2022-08-08 DIAGNOSIS — E1140 Type 2 diabetes mellitus with diabetic neuropathy, unspecified: Secondary | ICD-10-CM

## 2022-08-08 MED ORDER — TRESIBA FLEXTOUCH 100 UNIT/ML ~~LOC~~ SOPN: 145.0000 [IU] | PEN_INJECTOR | Freq: Every day | SUBCUTANEOUS | 11 refills | Status: DC

## 2022-08-08 NOTE — Telephone Encounter (Signed)
I ordered the tresiba.  Annlouise Gerety,cma

## 2022-08-08 NOTE — Telephone Encounter (Signed)
Called pt she checked it while we were on the phone and her heart rate was 80 and O2 is 94 pt states she has taken her metoprolol. Pt also states her sugar is still up she checked it while on the phone as well and it was 266.

## 2022-08-08 NOTE — Telephone Encounter (Signed)
Pt has been informed to take 29 units of the Novolog until Catie can get the pts [place]. Pt states she has not been taking tresiba or ozempic since it ran out. Once Pt gets with Catie and gets the tresiba pt will resume taking 24 units of the novolog and 15 units of the tresiba   [place] has been pended needed to know the frequency)

## 2022-08-11 DIAGNOSIS — Z9181 History of falling: Secondary | ICD-10-CM | POA: Diagnosis not present

## 2022-08-11 DIAGNOSIS — I2510 Atherosclerotic heart disease of native coronary artery without angina pectoris: Secondary | ICD-10-CM | POA: Diagnosis not present

## 2022-08-11 DIAGNOSIS — J449 Chronic obstructive pulmonary disease, unspecified: Secondary | ICD-10-CM | POA: Diagnosis not present

## 2022-08-11 DIAGNOSIS — Z9981 Dependence on supplemental oxygen: Secondary | ICD-10-CM | POA: Diagnosis not present

## 2022-08-11 DIAGNOSIS — E1169 Type 2 diabetes mellitus with other specified complication: Secondary | ICD-10-CM | POA: Diagnosis not present

## 2022-08-11 DIAGNOSIS — E782 Mixed hyperlipidemia: Secondary | ICD-10-CM | POA: Diagnosis not present

## 2022-08-11 DIAGNOSIS — I10 Essential (primary) hypertension: Secondary | ICD-10-CM | POA: Diagnosis not present

## 2022-08-11 DIAGNOSIS — G4700 Insomnia, unspecified: Secondary | ICD-10-CM | POA: Diagnosis not present

## 2022-08-11 DIAGNOSIS — Z8701 Personal history of pneumonia (recurrent): Secondary | ICD-10-CM | POA: Diagnosis not present

## 2022-08-11 DIAGNOSIS — R809 Proteinuria, unspecified: Secondary | ICD-10-CM | POA: Diagnosis not present

## 2022-08-11 DIAGNOSIS — R296 Repeated falls: Secondary | ICD-10-CM | POA: Diagnosis not present

## 2022-08-11 DIAGNOSIS — K219 Gastro-esophageal reflux disease without esophagitis: Secondary | ICD-10-CM | POA: Diagnosis not present

## 2022-08-11 DIAGNOSIS — Z6841 Body Mass Index (BMI) 40.0 and over, adult: Secondary | ICD-10-CM | POA: Diagnosis not present

## 2022-08-11 DIAGNOSIS — D649 Anemia, unspecified: Secondary | ICD-10-CM | POA: Diagnosis not present

## 2022-08-11 DIAGNOSIS — J9621 Acute and chronic respiratory failure with hypoxia: Secondary | ICD-10-CM | POA: Diagnosis not present

## 2022-08-12 DIAGNOSIS — E782 Mixed hyperlipidemia: Secondary | ICD-10-CM | POA: Diagnosis not present

## 2022-08-12 DIAGNOSIS — D649 Anemia, unspecified: Secondary | ICD-10-CM | POA: Diagnosis not present

## 2022-08-12 DIAGNOSIS — Z9981 Dependence on supplemental oxygen: Secondary | ICD-10-CM | POA: Diagnosis not present

## 2022-08-12 DIAGNOSIS — J9621 Acute and chronic respiratory failure with hypoxia: Secondary | ICD-10-CM | POA: Diagnosis not present

## 2022-08-12 DIAGNOSIS — I10 Essential (primary) hypertension: Secondary | ICD-10-CM | POA: Diagnosis not present

## 2022-08-12 DIAGNOSIS — I2510 Atherosclerotic heart disease of native coronary artery without angina pectoris: Secondary | ICD-10-CM | POA: Diagnosis not present

## 2022-08-12 DIAGNOSIS — R296 Repeated falls: Secondary | ICD-10-CM | POA: Diagnosis not present

## 2022-08-12 DIAGNOSIS — Z9181 History of falling: Secondary | ICD-10-CM | POA: Diagnosis not present

## 2022-08-12 DIAGNOSIS — J449 Chronic obstructive pulmonary disease, unspecified: Secondary | ICD-10-CM | POA: Diagnosis not present

## 2022-08-12 DIAGNOSIS — Z6841 Body Mass Index (BMI) 40.0 and over, adult: Secondary | ICD-10-CM | POA: Diagnosis not present

## 2022-08-12 DIAGNOSIS — R809 Proteinuria, unspecified: Secondary | ICD-10-CM | POA: Diagnosis not present

## 2022-08-12 DIAGNOSIS — G4700 Insomnia, unspecified: Secondary | ICD-10-CM | POA: Diagnosis not present

## 2022-08-12 DIAGNOSIS — E1169 Type 2 diabetes mellitus with other specified complication: Secondary | ICD-10-CM | POA: Diagnosis not present

## 2022-08-12 DIAGNOSIS — K219 Gastro-esophageal reflux disease without esophagitis: Secondary | ICD-10-CM | POA: Diagnosis not present

## 2022-08-12 DIAGNOSIS — Z8701 Personal history of pneumonia (recurrent): Secondary | ICD-10-CM | POA: Diagnosis not present

## 2022-08-13 ENCOUNTER — Encounter: Payer: Self-pay | Admitting: Internal Medicine

## 2022-08-13 ENCOUNTER — Ambulatory Visit (INDEPENDENT_AMBULATORY_CARE_PROVIDER_SITE_OTHER): Payer: Medicare HMO | Admitting: Internal Medicine

## 2022-08-13 VITALS — Ht 61.0 in | Wt 238.0 lb

## 2022-08-13 DIAGNOSIS — J9611 Chronic respiratory failure with hypoxia: Secondary | ICD-10-CM | POA: Diagnosis not present

## 2022-08-13 DIAGNOSIS — G8929 Other chronic pain: Secondary | ICD-10-CM | POA: Diagnosis not present

## 2022-08-13 DIAGNOSIS — K219 Gastro-esophageal reflux disease without esophagitis: Secondary | ICD-10-CM | POA: Diagnosis not present

## 2022-08-13 DIAGNOSIS — Z9981 Dependence on supplemental oxygen: Secondary | ICD-10-CM | POA: Diagnosis not present

## 2022-08-13 DIAGNOSIS — E1169 Type 2 diabetes mellitus with other specified complication: Secondary | ICD-10-CM | POA: Diagnosis not present

## 2022-08-13 DIAGNOSIS — I2510 Atherosclerotic heart disease of native coronary artery without angina pectoris: Secondary | ICD-10-CM | POA: Diagnosis not present

## 2022-08-13 DIAGNOSIS — I10 Essential (primary) hypertension: Secondary | ICD-10-CM | POA: Diagnosis not present

## 2022-08-13 DIAGNOSIS — J449 Chronic obstructive pulmonary disease, unspecified: Secondary | ICD-10-CM | POA: Diagnosis not present

## 2022-08-13 DIAGNOSIS — R809 Proteinuria, unspecified: Secondary | ICD-10-CM | POA: Diagnosis not present

## 2022-08-13 DIAGNOSIS — Z6841 Body Mass Index (BMI) 40.0 and over, adult: Secondary | ICD-10-CM | POA: Diagnosis not present

## 2022-08-13 DIAGNOSIS — E782 Mixed hyperlipidemia: Secondary | ICD-10-CM | POA: Diagnosis not present

## 2022-08-13 DIAGNOSIS — M545 Low back pain, unspecified: Secondary | ICD-10-CM | POA: Diagnosis not present

## 2022-08-13 DIAGNOSIS — G4700 Insomnia, unspecified: Secondary | ICD-10-CM | POA: Diagnosis not present

## 2022-08-13 DIAGNOSIS — R296 Repeated falls: Secondary | ICD-10-CM | POA: Diagnosis not present

## 2022-08-13 DIAGNOSIS — D649 Anemia, unspecified: Secondary | ICD-10-CM | POA: Diagnosis not present

## 2022-08-13 DIAGNOSIS — Z9181 History of falling: Secondary | ICD-10-CM | POA: Diagnosis not present

## 2022-08-13 DIAGNOSIS — Z8701 Personal history of pneumonia (recurrent): Secondary | ICD-10-CM | POA: Diagnosis not present

## 2022-08-13 DIAGNOSIS — J9621 Acute and chronic respiratory failure with hypoxia: Secondary | ICD-10-CM | POA: Diagnosis not present

## 2022-08-13 MED ORDER — TIZANIDINE HCL 4 MG PO TABS: 4.0000 mg | ORAL_TABLET | Freq: Four times a day (QID) | ORAL | 0 refills | Status: DC | PRN

## 2022-08-13 MED ORDER — INSULIN ASPART 100 UNIT/ML IJ SOLN: 50.0000 [IU] | Freq: Three times a day (TID) | INTRAMUSCULAR | 99 refills | Status: DC

## 2022-08-13 NOTE — Assessment & Plan Note (Signed)
She is having frequent spasms of paraspinus muscles bought on by walking .tizanidine prescribed along with plain films of lumbar spine

## 2022-08-13 NOTE — Progress Notes (Signed)
Virtual Visit converted to Telephone  Note    This format is felt to be most appropriate for this patient at this time.  All issues noted in this document were discussed and addressed.  No physical exam was performed (except for noted visual exam findings with Video Visits).   I attempted to connect with Catherine [name]  on 08/13/22 at  4:30 PM EST by a video enabled telemedicine application  and verified that I am speaking with the correct person using two identifiers. Location patient: home Location provider: work or home office Persons participating in the virtual visit: patient, provider  I discussed the limitations, risks, security and privacy concerns of performing an evaluation and management service by telephone and the availability of in person appointments. I also discussed with the patient that there may be a patient responsible charge related to this service. The patient expressed understanding and agreed to proceed.  Interactive audio and video telecommunications were attempted between this provider and patient, however failed, due to patient having technical difficultie We continued and completed visit with audio only.   Reason for visit: elevated blood sugars   HPI:  Seen on Nov 3  for COPD exacerbation , at which time her  A1c was 8.2     Her blood sugars have been elevated for several weeks.  It is not clear when she ran out o [place] and [place] ,  she was previously taking 149 units of [place] ; howev she has been unable to afford it and has been gradually increasing  her mealtime insulin and is currently taking 29 units q meal .  She takes a second dose 2 hrs post prandially for cgb over  241 s.    Has had  to do this most meals ( 3 times daily ).    Finished prednisone on nov 11 sugars still elevated   2 Back pain: chronic, non radiating  but prone to back spasm    ROS: See pertinent positives and negatives per HPI.  Past Medical History:  Diagnosis Date   Anxiety     Asthma    COPD (chronic obstructive pulmonary disease) (HCC)    COPD exacerbation (HCC) 08/10/2011   Exacerbation 12/2011 June 2013 mMRC 2 July PFT Ratio 50%, FEV1 0.91 L, 41% pred, DLCO 51% pred, TLC 3.39 L 72% pred Exacerbation 04/2012, admitted [facility]   Depression    Diabetes mellitus    Gastritis and duodenitis June 2011   EGD   GERD (gastroesophageal reflux disease)    Hyperglycemia    Hyperlipidemia    Hypertension    Leukocytosis    Obesity (BMI 30-39.9)    S/P cardiac catheterization March 2012   normal coronaries,  due to chest pain Mariah Milling)   Screening for breast cancer Jul 28 2011   normal   Screening for colon cancer June 2011   polyps found, next one due 2014 ([facility])   Tobacco abuse, in remission    quit oct during hospitalization     Past Surgical History:  Procedure Laterality Date   ABDOMINAL HYSTERECTOMY     ABDOMINAL HYSTERECTOMY     BILATERAL OOPHORECTOMY  1995   and TAH.  noncancerous reasons   CARDIAC CATHETERIZATION  11/22/2010   no significant disease    Family History  Problem Relation Age of Onset   Coronary artery disease Mother 30   Heart disease Mother        CABG x5   COPD Mother 47   Breast cancer  Mother    Kidney disease Mother    Heart failure Father    Angina Father    Diabetes Father    Depression Father    Stroke Father    Dementia Father    Asthma Brother    Asthma Brother    Diabetes Brother    Hypertension Brother    Cancer Sister        breast, lung, brain    SOCIAL HX:  reports that she quit smoking about 11 years ago. Her smoking use included cigarettes. She has a 40.00 pack-year smoking history. She has never used smokeless tobacco. She reports that she does not drink alcohol and does not use drugs.    Current Outpatient Medications:    albuterol (VENTOLIN HFA) 108 (90 Base) MCG/ACT inhaler, Inhale 1-2 puffs into the lungs every 6 (six) hours as needed for wheezing or shortness of breath., Disp: 18 each, Rfl: 3    ALPRAZolam (XANAX) 0.25 MG tablet, Take 1 tablet (0.25 mg total) by mouth at bedtime as needed for anxiety., Disp: 30 tablet, Rfl: 5   aspirin 81 MG EC tablet, Take 81 mg by mouth daily., Disp: , Rfl:    Budeson-Glycopyrrol-Formoterol (BREZTRI AEROSPHERE) 160-9-4.8 MCG/ACT AERO, Inhale 2 puffs into the lungs 2 (two) times daily., Disp: 32.1 g, Rfl: 3   buPROPion (WELLBUTRIN XL) 150 MG 24 hr tablet, TAKE 1 TABLET BY MOUTH EVERY DAY, Disp: 90 tablet, Rfl: 1   Cholecalciferol (VITAMIN D3) 125 MCG (5000 UT) CAPS, Take 1,000 Units by mouth daily. , Disp: , Rfl:    hydrochlorothiazide (HYDRODIURIL) 25 MG tablet, Take 1 tablet (25 mg total) by mouth daily., Disp: 90 tablet, Rfl: 3   insulin degludec (TRESIBA FLEXTOUCH) 100 UNIT/ML FlexTouch Pen, Inject 145 Units into the skin daily. (Patient not taking: Reported on 08/13/2022), Disp: 43.5 mL, Rfl: 11   ipratropium-albuterol (DUONEB) 0.5-2.5 (3) MG/3ML SOLN, USE 1 VIAL BY NEBULIZER EVERY 6 HOURS AS NEEDED, Disp: 360 mL, Rfl: 2   Lactobacillus (PROBIOTIC ACIDOPHILUS PO), Take 1 capsule by mouth daily., Disp: , Rfl:    lactulose (CHRONULAC) 10 GM/15ML solution, TAKE 30 ML EVERY 4 HOURS UNTIL CONSTIPATION IS RELIEVED, Disp: 473 mL, Rfl: 1   losartan (COZAAR) 100 MG tablet, Take 1 tablet (100 mg total) by mouth daily., Disp: 90 tablet, Rfl: 1   MAGNESIUM CITRATE PO, Take 1 tablet by mouth daily., Disp: , Rfl:    Melatonin 5 MG TABS, Take 5 mg by mouth at bedtime., Disp: , Rfl:    metFORMIN (GLUCOPHAGE) 1000 MG tablet, TAKE 1 TABLET (1,000 MG TOTAL) BY MOUTH TWICE A DAY WITH FOOD, Disp: 180 tablet, Rfl: 1   metoprolol succinate (TOPROL-XL) 25 MG 24 hr tablet, TAKE 1 TABLET BY MOUTH EVERY DAY, Disp: 90 tablet, Rfl: 3   PARoxetine (PAXIL) 20 MG tablet, TAKE 1 TABLET BY MOUTH EVERY DAY IN THE MORNING, Disp: 90 tablet, Rfl: 1   Potassium 99 MG TABS, Take 99 mg by mouth daily. Reported on 09/20/2015, Disp: , Rfl:    promethazine (PHENERGAN) 12.5 MG tablet, TAKE 1  TABLET (12.5 MG TOTAL) BY MOUTH AT BEDTIME AS NEEDED FOR NAUSEA OR VOMITING., Disp: 30 tablet, Rfl: 0   rosuvastatin (CRESTOR) 40 MG tablet, TAKE 1 TABLET BY MOUTH EVERY DAY, Disp: 90 tablet, Rfl: 3   tiZANidine (ZANAFLEX) 4 MG tablet, Take 1 tablet (4 mg total) by mouth every 6 (six) hours as needed for muscle spasms., Disp: 30 tablet, Rfl: 0   insulin  aspart (NOVOLOG) 100 UNIT/ML injection, Inject 50 Units into the skin 3 (three) times daily before meals., Disp: 30 mL, Rfl: PRN   Semaglutide, 1 MG/DOSE, (OZEMPIC, 1 MG/DOSE,) 4 MG/3ML SOPN, Inject 1 mg into the skin once a week. (Patient not taking: Reported on 08/13/2022), Disp: 9 mL, Rfl: 3  EXAM:  VITALS per patient if applicable:  GENERAL: alert, oriented, appears well and in no acute distress  HEENT: atraumatic, conjunttiva clear, no obvious abnormalities on inspection of external nose and ears  NECK: normal movements of the head and neck  LUNGS: on inspection no signs of respiratory distress, breathing rate appears normal, no obvious gross SOB, gasping or wheezing  CV: no obvious cyanosis  MS: moves all visible extremities without noticeable abnormality  PSYCH/NEURO: pleasant and cooperative, no obvious depression or anxiety, speech and thought processing grossly intact  ASSESSMENT AND PLAN:  Discussed the following assessment and plan:  Chronic right-sided low back pain without sciatica - Plan: DG Lumbar Spine Complete  Chronic hypoxemic respiratory failure (HCC)  Diabetes mellitus type 2, uncontrolled, with complications (HCC) Secondary to lapse in basal insulin (Tresiba 140 units) due to cost.  Continue mealtime insulin at higher doses (50 units ) until [place] is recevied   Chronic hypoxemic respiratory failure (HCC) She has become dyspneic and hypoxic with minimal exertion despite use of 4 L./min supplemental oxygen. She now has mobility limitation due to   chronic hypoxia that prevents aher from ccomplishing ADL's.  This cannot be resolved by use of a fitted cane or walker. This patient does not have sufficient  lung function to propel a wheelchair to perform mobility related ADL's. This patient could benefit from use of a motorized scooter  and can safely transfer to and from a scooter and operate the controls.     I discussed the assessment and treatment plan with the patient. The patient was provided an opportunity to ask questions and all were answered. The patient agreed with the plan and demonstrated an understanding of the instructions.   The patient was advised to call back or seek an in-person evaluation if the symptoms worsen or if the condition fails to improve as anticipated.   I spent 30 minutes dedicated to the care of this patient on the date of this encounter to include pre-visit review of his medical history,  non Face-to-face time with the patient , and post visit ordering of testing and therapeutics.    Sherlene Shams, MD

## 2022-08-13 NOTE — Patient Instructions (Signed)
Increase the novolog insulin to 50 units before each meal.

## 2022-08-13 NOTE — Assessment & Plan Note (Signed)
Secondary to lapse in basal insulin (Tresiba 140 units) due to cost.  Continue mealtime insulin at higher doses (50 units ) until [place] is recevied

## 2022-08-13 NOTE — Assessment & Plan Note (Signed)
She has become dyspneic and hypoxic with minimal exertion despite use of 4 L./min supplemental oxygen. She now has mobility limitation due to   chronic hypoxia that prevents aher from ccomplishing ADL's. This cannot be resolved by use of a fitted cane or walker. This patient does not have sufficient  lung function to propel a wheelchair to perform mobility related ADL's. This patient could benefit from use of a motorized scooter  and can safely transfer to and from a scooter and operate the controls.

## 2022-08-17 DIAGNOSIS — I10 Essential (primary) hypertension: Secondary | ICD-10-CM | POA: Diagnosis not present

## 2022-08-17 DIAGNOSIS — F419 Anxiety disorder, unspecified: Secondary | ICD-10-CM | POA: Diagnosis not present

## 2022-08-17 DIAGNOSIS — R32 Unspecified urinary incontinence: Secondary | ICD-10-CM | POA: Diagnosis not present

## 2022-08-17 DIAGNOSIS — J9610 Chronic respiratory failure, unspecified whether with hypoxia or hypercapnia: Secondary | ICD-10-CM | POA: Diagnosis not present

## 2022-08-17 DIAGNOSIS — J439 Emphysema, unspecified: Secondary | ICD-10-CM | POA: Diagnosis not present

## 2022-08-17 DIAGNOSIS — M1990 Unspecified osteoarthritis, unspecified site: Secondary | ICD-10-CM | POA: Diagnosis not present

## 2022-08-17 DIAGNOSIS — R69 Illness, unspecified: Secondary | ICD-10-CM | POA: Diagnosis not present

## 2022-08-17 DIAGNOSIS — Z008 Encounter for other general examination: Secondary | ICD-10-CM | POA: Diagnosis not present

## 2022-08-17 DIAGNOSIS — D6869 Other thrombophilia: Secondary | ICD-10-CM | POA: Diagnosis not present

## 2022-08-17 DIAGNOSIS — Z794 Long term (current) use of insulin: Secondary | ICD-10-CM | POA: Diagnosis not present

## 2022-08-17 DIAGNOSIS — I4891 Unspecified atrial fibrillation: Secondary | ICD-10-CM | POA: Diagnosis not present

## 2022-08-17 DIAGNOSIS — K5900 Constipation, unspecified: Secondary | ICD-10-CM | POA: Diagnosis not present

## 2022-08-18 ENCOUNTER — Telehealth: Payer: Self-pay | Admitting: Pharmacist

## 2022-08-18 ENCOUNTER — Other Ambulatory Visit: Payer: Medicare HMO | Admitting: Pharmacist

## 2022-08-18 ENCOUNTER — Other Ambulatory Visit: Payer: Self-pay

## 2022-08-18 MED ORDER — OZEMPIC (0.25 OR 0.5 MG/DOSE) 2 MG/3ML ~~LOC~~ SOPN: PEN_INJECTOR | SUBCUTANEOUS | 0 refills | Status: DC

## 2022-08-18 MED ORDER — IPRATROPIUM-ALBUTEROL 0.5-2.5 (3) MG/3ML IN SOLN
3.0000 mL | Freq: Four times a day (QID) | RESPIRATORY_TRACT | 2 refills | Status: DC | PRN
  Filled 2022-08-18 – 2022-08-20 (×2): qty 360, 30d supply, fill #0
  Filled 2022-09-09: qty 360, 28d supply, fill #0

## 2022-08-18 NOTE — Progress Notes (Addendum)
08/18/2022 Name: Catherine [name] MRN: [id number] DOB: [birth year]  Chief Complaint  Patient presents with   Medication Management   Diabetes   Hyperlipidemia   Hypertension    Catherine [name] is a 66 y.o. year old female who presented for a telephone visit.   They were referred to the pharmacist by their PCP for assistance in managing diabetes and hypertension.    Subjective:  Care Team: Primary Care Provider: Sherlene Shams, MD ; Next Scheduled Visit: not scheduled   Medication Access/Adherence  Current Pharmacy:  [phone] [place] - [address] [place] [address] Phone: [phone] Fax: [phone]  [id number] [place], [place] - [address] [place] [address] Phone: [phone] Fax: [phone]  [id number],  - [address] AT [facility] [address] [facility] [place] [address] Phone: [phone] Fax: [phone]  [facility] - [facility] [address] [place] [address] Phone: [phone] Fax: [phone]   Patient reports affordability concerns with their medications: Yes  Patient reports access/transportation concerns to their pharmacy: Yes  Patient reports adherence concerns with their medications:  Yes    Patient lost to follow up at the beginning of 2023 so patient assistance ran out. Patient has been without [place] or Ozempic for several months now   Diabetes:  Current medications: metformin 1000 mg twice daily, Novolog 30 units three times daily; has been out of Ozempic 1 mg and [place] for a while  Date of Download: 11/28-12/11/23 % Time CGM is active: 88% Average Glucose: 208 mg/dL Glucose Management Indicator: 8.3  Glucose Variability: 26 (goal <36%) Time in Goal:  - Time in range 70-180: 28% - Time above range: 71% - Time below range: 1%  Needs to be re-enrolled for patient  assistance  Hypertension:  Current medications: losartan 100 mg daily, HCTZ 25 mg daily, metoprolol succinate 25 mg daily  Patient has an automated, upper arm home BP cuff Current blood pressure readings readings: 120-130s/70-80s  Denies dizziness, lightheadedness  Hyperlipidemia/ASCVD Risk Reduction  Current lipid lowering medications: rosuvastatin 40 mg daily  Antiplatelet regimen: aspirin 81 mg daily  COPD:  Current medications: stopped Breztri due to perceived lack of benefit; currently taking ipratropium/albuterol 4 times daily with albuterol HFA PRN  Reports 1 exacerbations in the past year  Current medication access support: needs to reapply for PAP  Health Maintenance  Health Maintenance Due  Topic Date Due   DTaP/Tdap/Td (2 - Td or Tdap) 12/23/2015   OPHTHALMOLOGY EXAM  01/29/2017   Lung Cancer Screening  01/18/2021   FOOT EXAM  12/13/2021     Objective: Lab Results  Component Value Date   HGBA1C 8.2 (H) 07/11/2022    Lab Results  Component Value Date   CREATININE 0.76 07/11/2022   BUN 11 07/11/2022   NA 143 07/11/2022   K 4.0 07/11/2022   CL 98 07/11/2022   CO2 37 (H) 07/11/2022    Lab Results  Component Value Date   CHOL 127 07/11/2022   HDL 43.50 07/11/2022   LDLCALC 45 12/13/2020   LDLDIRECT 52.0 07/11/2022   TRIG 233.0 (H) 07/11/2022   CHOLHDL 3 07/11/2022    Medications Reviewed Today     Reviewed by Alden Hipp, RPH-CPP (Pharmacist) on 08/19/22 at 1346  Med List Status: <None>   Medication Order Taking? Sig Documenting Provider Last Dose Status  Informant  albuterol (VENTOLIN HFA) 108 (90 Base) MCG/ACT inhaler [id number] Yes Inhale 1-2 puffs into the lungs every 6 (six) hours as needed for wheezing or shortness of breath. Sherlene Shams, MD Taking Active   ALPRAZolam Prudy Feeler) 0.25 MG tablet [id number] Yes Take 1 tablet (0.25 mg total) by mouth at bedtime as needed for anxiety. Sherlene Shams, MD Taking Active   aspirin 81 MG EC  tablet [id number] Yes Take 81 mg by mouth daily. [provider] Taking Active Self  Budeson-Glycopyrrol-Formoterol (BREZTRI AEROSPHERE) 160-9-4.8 MCG/ACT AERO [id number] No Inhale 2 puffs into the lungs 2 (two) times daily.  Patient not taking: Reported on 08/18/2022   Sherlene Shams, MD Not Taking Active            Med Note Raeanne Gathers Aug 18, 2022  2:05 PM)    buPROPion (WELLBUTRIN XL) 150 MG 24 hr tablet [id number] Yes TAKE 1 TABLET BY MOUTH EVERY DAY Sherlene Shams, MD Taking Active   Cholecalciferol (VITAMIN D3) 125 MCG (5000 UT) CAPS [id number] Yes Take 1,000 Units by mouth daily.  [provider] Taking Active Self  hydrochlorothiazide (HYDRODIURIL) 25 MG tablet [id number] Yes Take 1 tablet (25 mg total) by mouth daily. Sherlene Shams, MD Taking Active   insulin aspart (NOVOLOG) 100 UNIT/ML injection [id number] Yes Inject 50 Units into the skin 3 (three) times daily before meals. Sherlene Shams, MD Taking Active   insulin degludec (TRESIBA FLEXTOUCH) 100 UNIT/ML FlexTouch Pen [id number]  Inject 145 Units into the skin daily.  Patient not taking: Reported on 08/13/2022   Sherlene Shams, MD  Active   ipratropium-albuterol (DUONEB) 0.5-2.5 (3) MG/3ML SOLN [id number]  Take 3 mLs by nebulization every 6 (six) hours as needed. Sherlene Shams, MD  Active   Lactobacillus (PROBIOTIC ACIDOPHILUS PO) [id number] Yes Take 1 capsule by mouth daily. [provider] Taking Active Self  lactulose (CHRONULAC) 10 GM/15ML solution [id number] Yes TAKE 30 ML EVERY 4 HOURS UNTIL CONSTIPATION IS RELIEVED Sherlene Shams, MD Taking Active   losartan (COZAAR) 100 MG tablet [id number] Yes Take 1 tablet (100 mg total) by mouth daily. Sherlene Shams, MD Taking Active   MAGNESIUM CITRATE PO [id number] Yes Take 1 tablet by mouth daily. [provider] Taking Active   Melatonin 5 MG TABS [id number] Yes Take 5 mg by mouth at bedtime. [provider] Taking Active Self   metFORMIN (GLUCOPHAGE) 1000 MG tablet [id number] Yes TAKE 1 TABLET (1,000 MG TOTAL) BY MOUTH TWICE A DAY WITH FOOD Sherlene Shams, MD Taking Active   metoprolol succinate (TOPROL-XL) 25 MG 24 hr tablet [id number] Yes TAKE 1 TABLET BY MOUTH EVERY DAY Sherlene Shams, MD Taking Active   PARoxetine (PAXIL) 20 MG tablet [id number] Yes TAKE 1 TABLET BY MOUTH EVERY DAY IN THE MORNING Sherlene Shams, MD Taking Active   Potassium 99 MG TABS [id number] Yes Take 99 mg by mouth daily. Reported on 09/20/2015 [provider] Taking Active Self           Med Note (CAULFIELD, ASHLEY L   Thu Dec 06, 2015 10:41 AM)    promethazine (PHENERGAN) 12.5 MG tablet [id number] Yes TAKE 1 TABLET (12.5 MG TOTAL) BY MOUTH AT BEDTIME AS NEEDED FOR NAUSEA OR VOMITING. Sherlene Shams, MD Taking Active   rosuvastatin (CRESTOR) 40 MG tablet [id number] Yes TAKE 1 TABLET BY MOUTH EVERY DAY Sherlene Shams, MD Taking Active   Semaglutide, 1 MG/DOSE, (  OZEMPIC, 1 MG/DOSE,) 4 MG/3ML SOPN [id number] No Inject 1 mg into the skin once a week.  Patient not taking: Reported on 08/13/2022   Sherlene Shams, MD Not Taking Active   Semaglutide,0.25 or 0.5MG /DOS, (OZEMPIC, 0.25 OR 0.5 MG/DOSE,) 2 MG/3ML SOPN [id number]  Inject 0.25 mg weekly for 4 weeks then increase to 0.5 mg weekly Sherlene Shams, MD  Active   tiZANidine (ZANAFLEX) 4 MG tablet [id number] Yes Take 1 tablet (4 mg total) by mouth every 6 (six) hours as needed for muscle spasms. Sherlene Shams, MD Taking Active               Assessment/Plan:   Diabetes: - Currently uncontrolled - Collaborated with office staff to provide sample of started dose Ozempic. Restart Ozempic at 0.25 mg weekly for 4 weeks, then increase to 0.5 mg weekly. Will work on procuring [employer].  - Continue Novolog 30 units with meals for now. Patient using CGM, aware to contact me if development of hypoglycemia to adjust regimen.  - Meets financial criteria for [employer] patient assistance  program. Will collaborate with provider, CPhT, and patient to pursue assistance.   Hypertension: - Currently controlled - Recommend to continue current regimen  Hyperlipidemia/ASCVD Risk Reduction: - Currently controlled.  - Recommend to continue current regimen at this time   COPD: - Currently uncontrolled.  - Recommend to restart Breztri. Counseled on benefit of reduction in COPD exacerbations with treatment - Duoneb prescription sent to [facility] for access. Patient aware of location - Meets financial criteria for [facility] patient assistance program through [employer]. Will collaborate with provider, CPhT, and patient to pursue assistance.    Follow Up Plan: phone call in 2 weeks  Catie Eppie Gibson, PharmD, [facility] [phone]   I have reviewed the above information and agree with above.   Duncan Dull, MD

## 2022-08-18 NOTE — Telephone Encounter (Signed)
Medication Samples have been provided to the patient.  Drug name: Ozempic       Strength: 0.25 mg        Qty: 1 box  LOT: [id number]  Exp.Date: 03/07/2024  Dosing instructions: Inject 0.25 mg into skin once weekly for 4 weeks and then increase to 0.5 mg once weekly .  The patient has been instructed regarding the correct time, dose, and frequency of taking this medication, including desired effects and most common side effects.   Catherine [name] 5:38 PM 08/18/2022

## 2022-08-18 NOTE — Telephone Encounter (Signed)
Is pt aware that she can can pick up the Ozempic or do I need to call her?

## 2022-08-18 NOTE — Telephone Encounter (Signed)
Please prepare 1 box of Ozempic 0.25/0.5 (red box) for this patient.   Inject 0.25 mg weekly for 4 weeks, then increase to 0.5 mg weekly.    Will work on Patient Assistance for 1 mg dose as well as [place] for patient assistance enrollment for 2024.

## 2022-08-19 NOTE — Telephone Encounter (Signed)
noted 

## 2022-08-19 NOTE — Telephone Encounter (Signed)
MyChart messgae sent to patient. 

## 2022-08-19 NOTE — Patient Instructions (Signed)
Hi Debbie,   Restart Ozempic at 0.25 mg weekly for 4 weeks, then increase to 0.5 mg weekly. We will pursue assistance for Ozempic 1 mg, Tresiba, and Novolog and the [place] from the patient assistance companies.   Please call me if any questions or concerns. Thanks!  Catie Eppie Gibson, PharmD, [facility] [phone]

## 2022-08-20 ENCOUNTER — Telehealth: Payer: Self-pay

## 2022-08-20 ENCOUNTER — Other Ambulatory Visit: Payer: Self-pay

## 2022-08-20 NOTE — Telephone Encounter (Addendum)
Submitted application for OZEMPIC, TRESIBA,NOVOLOG, to NOVO [place]  and BREZTRI  to [place] for [place].   [place] Phone: [phone] Pat Kocher [place]: [phone]     Mailed renewal application to patient home for Breztri([place]) and Ozempic, Tresiba, Novolog (NOVO NORDISK).  Patient will be returning to office.   Upon patient returning Please have office staff fax pt portion to [phone] ATTENTION Johany Hansman.   Georga Bora Rx Patient Advocate

## 2022-08-20 NOTE — Telephone Encounter (Signed)
Patient came in and picked up her sample of Ozempic tofay.  Bonna Steury,cma

## 2022-08-21 ENCOUNTER — Other Ambulatory Visit: Payer: Self-pay | Admitting: Internal Medicine

## 2022-08-21 DIAGNOSIS — J432 Centrilobular emphysema: Secondary | ICD-10-CM

## 2022-08-21 DIAGNOSIS — J9611 Chronic respiratory failure with hypoxia: Secondary | ICD-10-CM

## 2022-08-22 ENCOUNTER — Telehealth: Payer: Self-pay

## 2022-08-22 NOTE — Telephone Encounter (Signed)
Pervis Hocking from pts PT called stating she just wanted to let Dr. Darrick Huntsman know they were going to miss today's visit with the pt because she has not been feeling well for over a week.

## 2022-08-22 NOTE — Telephone Encounter (Signed)
FYI

## 2022-08-25 DIAGNOSIS — G839 Paralytic syndrome, unspecified: Secondary | ICD-10-CM | POA: Diagnosis not present

## 2022-08-25 DIAGNOSIS — J449 Chronic obstructive pulmonary disease, unspecified: Secondary | ICD-10-CM | POA: Diagnosis not present

## 2022-08-30 ENCOUNTER — Other Ambulatory Visit: Payer: Self-pay | Admitting: Internal Medicine

## 2022-09-02 DIAGNOSIS — E1165 Type 2 diabetes mellitus with hyperglycemia: Secondary | ICD-10-CM | POA: Diagnosis not present

## 2022-09-03 ENCOUNTER — Telehealth: Payer: Self-pay | Admitting: Internal Medicine

## 2022-09-03 ENCOUNTER — Other Ambulatory Visit: Payer: Medicare HMO | Admitting: Pharmacist

## 2022-09-03 NOTE — Telephone Encounter (Signed)
Pt called stating she is lightheaded, has a fever, and congested. Sent to access nurse

## 2022-09-03 NOTE — Telephone Encounter (Signed)
Pt advised needs to be evaluated due to lightheadedness, fever, and congestion. Pt agreeable to go to [facility] across the street from the office for eval.  Stated will go over now.

## 2022-09-09 ENCOUNTER — Other Ambulatory Visit: Payer: Self-pay

## 2022-09-09 ENCOUNTER — Other Ambulatory Visit: Payer: Medicare HMO | Admitting: Pharmacist

## 2022-09-09 ENCOUNTER — Telehealth: Payer: Self-pay | Admitting: Internal Medicine

## 2022-09-09 DIAGNOSIS — E1140 Type 2 diabetes mellitus with diabetic neuropathy, unspecified: Secondary | ICD-10-CM

## 2022-09-09 MED ORDER — TRESIBA FLEXTOUCH 200 UNIT/ML ~~LOC~~ SOPN: 20.0000 [IU] | PEN_INJECTOR | Freq: Every day | SUBCUTANEOUS | 0 refills | Status: DC

## 2022-09-09 MED ORDER — NOVOLOG FLEXPEN 100 UNIT/ML ~~LOC~~ SOPN: 16.0000 [IU] | PEN_INJECTOR | Freq: Three times a day (TID) | SUBCUTANEOUS | 11 refills | Status: DC

## 2022-09-09 NOTE — Progress Notes (Signed)
09/09/2022 Name: Catherine [name] MRN: [id number] DOB: 04-Aug-1956  Chief Complaint  Patient presents with   Medication Management   Diabetes    Catherine [name] is a 67 y.o. year old female who presented for a telephone visit.   They were referred to the pharmacist by their PCP for assistance in managing diabetes.   Subjective:  Care Team: Primary Care Provider: Crecencio Mc, MD ; Next Scheduled Visit: not scheduled  Medication Access/Adherence  Current Pharmacy:  [id number] - [place], [place] [address] Phone: [phone] Fax: [phone]  [id number] - [place], [place] [address] [place] [address] Phone: [phone] Fax: [place], [place] [address] [place] [facility] [place] [address] Phone: [phone] Fax: [place] [address] Phone: [phone] Fax: [phone]   Patient reports affordability concerns with their medications: Yes  Patient reports access/transportation concerns to their pharmacy: No  Patient reports adherence concerns with their medications:  Yes     Diabetes:  Current medications: Ozempic 0.25 mg weekly, Novolog 25 units with meals- though occasional hypoglycemia after taking this dose, so has reduced to ~20-24 units with meals; has been out of [place] for several weeks  Date of Download: 08/27/22-09/09/22 % Time CGM is active: 47% Average Glucose: 221 mg/dL Glucose Management Indicator: 8.6  Glucose Variability: 23.3 (goal <36%) Time in Goal:  - Time in range 70-180: 82% - Time above range: 16% - Time below range: 2% Observed patterns:  COPD:  Current medications: Breztri 2 puffs twice daily; has been struggling with getting Duonebs at [facility] or [facility] due to back orders    Health  Maintenance  Health Maintenance Due  Topic Date Due   COVID-19 Vaccine (1) Never done   DTaP/Tdap/Td (2 - Td or Tdap) 12/23/2015   OPHTHALMOLOGY EXAM  01/29/2017   Lung Cancer Screening  01/18/2021   FOOT EXAM  12/13/2021   Medicare Annual Wellness (AWV)  10/23/2022     Objective: Lab Results  Component Value Date   HGBA1C 8.2 (H) 07/11/2022    Lab Results  Component Value Date   CREATININE 0.76 07/11/2022   BUN 11 07/11/2022   NA 143 07/11/2022   K 4.0 07/11/2022   CL 98 07/11/2022   CO2 37 (H) 07/11/2022    Lab Results  Component Value Date   CHOL 127 07/11/2022   HDL 43.50 07/11/2022   LDLCALC 45 12/13/2020   LDLDIRECT 52.0 07/11/2022   TRIG 233.0 (H) 07/11/2022   CHOLHDL 3 07/11/2022    Medications Reviewed Today     Reviewed by Osker Mason, RPH-CPP (Pharmacist) on 09/09/22 at 41  Med List Status: <None>   Medication Order Taking? Sig Documenting Provider Last Dose Status Informant  albuterol (VENTOLIN HFA) 108 (90 Base) MCG/ACT inhaler [id number] Yes INHALE 1-2 PUFFS BY MOUTH EVERY 6 HOURS AS NEEDED FOR WHEEZE OR SHORTNESS OF BREATH Crecencio Mc, MD Taking Active   ALPRAZolam Duanne Moron) 0.25 MG tablet [id number]  Take 1 tablet (0.25 mg total) by mouth at bedtime as needed for anxiety. Crecencio Mc, MD  Active   aspirin 81 MG EC tablet [id number]  Take 81 mg by mouth daily. [provider]  Active Self  Budeson-Glycopyrrol-Formoterol (BREZTRI AEROSPHERE) 160-9-4.8 MCG/ACT AERO [id number]  Yes Inhale 2 puffs into the lungs 2 (two) times daily. Crecencio Mc, MD Taking Active            Med Note Cleopatra Cedar Aug 18, 2022  2:05 PM)    buPROPion (WELLBUTRIN XL) 150 MG 24 hr tablet [id number] Yes TAKE 1 TABLET BY MOUTH EVERY DAY Crecencio Mc, MD Taking Active   Cholecalciferol (VITAMIN D3) 125 MCG (5000 UT) CAPS [id number] Yes Take 1,000 Units by mouth daily.  [provider] Taking Active Self  hydrochlorothiazide  (HYDRODIURIL) 25 MG tablet [id number] Yes Take 1 tablet (25 mg total) by mouth daily. Crecencio Mc, MD Taking Active   insulin aspart (NOVOLOG) 100 UNIT/ML injection [id number] Yes Inject 50 Units into the skin 3 (three) times daily before meals. Crecencio Mc, MD Taking Active            Med Note Jodi Mourning, Loraine Grip Sep 09, 2022  4:03 PM) Taking 25 units   insulin degludec (TRESIBA FLEXTOUCH) 100 UNIT/ML FlexTouch Pen [id number] No Inject 145 Units into the skin daily.  Patient not taking: Reported on 08/13/2022   Crecencio Mc, MD Not Taking Active   ipratropium-albuterol (DUONEB) 0.5-2.5 (3) MG/3ML SOLN [id number] Yes Take 3 mLs by nebulization every 6 (six) hours as needed. Crecencio Mc, MD Taking Active   Lactobacillus (PROBIOTIC ACIDOPHILUS PO) [id number]  Take 1 capsule by mouth daily. [provider]  Active Self  lactulose (CHRONULAC) 10 GM/15ML solution [id number]  TAKE 30 ML EVERY 4 HOURS UNTIL CONSTIPATION IS RELIEVED Crecencio Mc, MD  Active   losartan (COZAAR) 100 MG tablet [id number] Yes Take 1 tablet (100 mg total) by mouth daily. Crecencio Mc, MD Taking Active   MAGNESIUM CITRATE PO [id number]  Take 1 tablet by mouth daily. [provider]  Active   Melatonin 5 MG TABS [id number]  Take 5 mg by mouth at bedtime. [provider]  Active Self  metFORMIN (GLUCOPHAGE) 1000 MG tablet [id number] Yes TAKE 1 TABLET (1,000 MG TOTAL) BY MOUTH TWICE A DAY WITH FOOD Crecencio Mc, MD Taking Active   metoprolol succinate (TOPROL-XL) 25 MG 24 hr tablet [id number] Yes TAKE 1 TABLET BY MOUTH EVERY DAY Crecencio Mc, MD Taking Active   PARoxetine (PAXIL) 20 MG tablet [id number]  TAKE 1 TABLET BY MOUTH EVERY DAY IN THE MORNING Crecencio Mc, MD  Active   Potassium 99 MG TABS [id number]  Take 99 mg by mouth daily. Reported on 09/20/2015 [provider]  Active Self           Med Note (CAULFIELD, ASHLEY L   Thu Dec 06, 2015 10:41 AM)    promethazine  (PHENERGAN) 12.5 MG tablet [id number]  TAKE 1 TABLET (12.5 MG TOTAL) BY MOUTH AT BEDTIME AS NEEDED FOR NAUSEA OR VOMITING. Crecencio Mc, MD  Active   rosuvastatin (CRESTOR) 40 MG tablet [id number] Yes TAKE 1 TABLET BY MOUTH EVERY DAY Crecencio Mc, MD Taking Active   Semaglutide, 1 MG/DOSE, (OZEMPIC, 1 MG/DOSE,) 4 MG/3ML SOPN [id number] No Inject 1 mg into the skin once a week.  Patient not taking: Reported on 08/13/2022   Crecencio Mc, MD Not Taking Active   Semaglutide,0.25 or 0.5MG /DOS, (OZEMPIC, 0.25 OR 0.5 MG/DOSE,) 2 MG/3ML SOPN [id number] Yes Inject 0.25 mg weekly for 4 weeks then increase to 0.5 mg weekly Crecencio Mc, MD Taking Active   tiZANidine (ZANAFLEX) 4 MG tablet [id number]  Yes Take 1 tablet (4 mg total) by mouth every 6 (six) hours as needed for muscle spasms. Crecencio Mc, MD Taking Active               Assessment/Plan:   Diabetes: - Currently uncontrolled due to medication access barriers - Patient received her portions of [place] application for patient assistance. She will complete and return to the office as per CPhT's instructions. Will collaborate with CPhT to have provider portion faxed to Dr. Derrel Nip for completion.  - Continue sample of Ozempic 0.25 mg weekly. Reduce Novolog to 16 units with meals. Working with clinic to obtain [place] sample while working on patient assistance.   COPD: - Currently uncontrolled.  - Collaborated with pharmacy regarding refill for DuoNebs      Follow Up Plan: phone call in 2 weeks  Catie TJodi Mourning, PharmD, [place], [facility] [phone]

## 2022-09-09 NOTE — Telephone Encounter (Signed)
Manuela Schwartz from [facility] called stating pt missed two home health PT appointments due to her being sick and Manuela Schwartz believe nursing is going to discharge the pt. Manuela Schwartz does not have any more orders. Manuela Schwartz stated the provider can refer her again if they feel a reason to do so

## 2022-09-09 NOTE — Telephone Encounter (Signed)
FYI

## 2022-09-10 ENCOUNTER — Other Ambulatory Visit: Payer: Self-pay

## 2022-09-10 NOTE — Telephone Encounter (Signed)
FAXED PCP page(s) to office. For [employer]. Please complete ALL SECTION FOR PRESCRIBER, SIGN and FAX Upon patient returning Please have office staff fax pt portion and prescriber portion to [phone] ATTENTION Ommie Degeorge.   Sandre [facility] Patient Advocate

## 2022-09-12 ENCOUNTER — Telehealth: Payer: Self-pay

## 2022-09-12 ENCOUNTER — Telehealth: Payer: Self-pay | Admitting: Internal Medicine

## 2022-09-12 NOTE — Telephone Encounter (Signed)
Pt called stating she takes 20 units daily of tresiba

## 2022-09-12 NOTE — Telephone Encounter (Signed)
noted 

## 2022-09-12 NOTE — Telephone Encounter (Signed)
Patient came into office to pick up pt assistance medication Tresiba, 1 box on 09/12/22 @ 11:40am

## 2022-09-23 ENCOUNTER — Other Ambulatory Visit: Payer: Self-pay | Admitting: Internal Medicine

## 2022-09-23 DIAGNOSIS — E118 Type 2 diabetes mellitus with unspecified complications: Secondary | ICD-10-CM

## 2022-09-24 NOTE — Telephone Encounter (Signed)
Yes I did and she said she was going to check with her husband to see if he know where they are.

## 2022-09-24 NOTE — Telephone Encounter (Signed)
I called and spoke to patient to find out had filled out paper work for pap Novo nodisk she states that everything was the same, but I told her we stilled need her to send back sign application and she stated she was waiting on her taxes .

## 2022-09-25 DIAGNOSIS — G839 Paralytic syndrome, unspecified: Secondary | ICD-10-CM | POA: Diagnosis not present

## 2022-09-25 DIAGNOSIS — J449 Chronic obstructive pulmonary disease, unspecified: Secondary | ICD-10-CM | POA: Diagnosis not present

## 2022-09-30 ENCOUNTER — Telehealth: Payer: Self-pay | Admitting: Internal Medicine

## 2022-09-30 NOTE — Telephone Encounter (Signed)
Pt husband came into the office to drop off patient assistance form to be faxed to [employer]

## 2022-09-30 NOTE — Telephone Encounter (Signed)
Received; forwarded to Anette Riedel.

## 2022-10-02 ENCOUNTER — Other Ambulatory Visit (HOSPITAL_COMMUNITY): Payer: Self-pay

## 2022-10-03 DIAGNOSIS — E1165 Type 2 diabetes mellitus with hyperglycemia: Secondary | ICD-10-CM | POA: Diagnosis not present

## 2022-10-12 ENCOUNTER — Telehealth: Payer: Self-pay | Admitting: Internal Medicine

## 2022-10-13 ENCOUNTER — Other Ambulatory Visit: Payer: Self-pay | Admitting: Internal Medicine

## 2022-10-14 MED ORDER — LACTULOSE 10 GM/15ML PO SOLN: ORAL | 1 refills | Status: DC

## 2022-10-14 NOTE — Telephone Encounter (Signed)
refilled 

## 2022-10-14 NOTE — Addendum Note (Signed)
Addended by: Adair Laundry on: 10/14/2022 10:42 AM   Modules accepted: Orders

## 2022-10-14 NOTE — Telephone Encounter (Signed)
Pt need a refill on lactulose sent to [facility]

## 2022-10-20 ENCOUNTER — Other Ambulatory Visit: Payer: Self-pay | Admitting: Internal Medicine

## 2022-10-21 NOTE — Telephone Encounter (Signed)
Submitted application for TRESIBA, NOVOLOG, OZEMPIC to [place] for patient assistance.   Phone: [phone]  PT CALLED WAS CHECKING ON STATUS OF PAP FOR NOVO [place] I CALLED COMPANY AND STATUS STILL PENDING DUE TO MISSING PROVIDER STATE LICENSE NUMBER ,SO I PUT IT ON APP AND RE SENT TO COMPANY    PLEASE NOT THAT NOVO [place] NOTES SAYING IT WILL TAKE LONGER THE NORMAL SHOULD BE ABOUT about 4-6 weeks for a decision and shipment from [place].   Sandre Kitty Rx Patient Advocate

## 2022-10-21 NOTE — Telephone Encounter (Signed)
noted 

## 2022-10-26 DIAGNOSIS — G839 Paralytic syndrome, unspecified: Secondary | ICD-10-CM | POA: Diagnosis not present

## 2022-10-26 DIAGNOSIS — J449 Chronic obstructive pulmonary disease, unspecified: Secondary | ICD-10-CM | POA: Diagnosis not present

## 2022-10-27 ENCOUNTER — Ambulatory Visit (INDEPENDENT_AMBULATORY_CARE_PROVIDER_SITE_OTHER): Payer: Medicare HMO

## 2022-10-27 VITALS — Ht 61.0 in | Wt 238.0 lb

## 2022-10-27 DIAGNOSIS — Z0000 Encounter for general adult medical examination without abnormal findings: Secondary | ICD-10-CM

## 2022-10-27 NOTE — Patient Instructions (Addendum)
[name] , Thank you for taking time to come for your Medicare Wellness Visit. I appreciate your ongoing commitment to your health goals. Please review the following plan we discussed and let me know if I can assist you in the future.   These are the goals we discussed:  Goals       Patient Stated     DIET - INCREASE WATER INTAKE (pt-stated)      Add flavor packets Stay hydrated      Increase physical activity (pt-stated)      Chair exercises  Leg lifts Arm raises Range of motion exercises        This is a list of the screening recommended for you and due dates:  Health Maintenance  Topic Date Due   DTaP/Tdap/Td vaccine (2 - Td or Tdap) 12/23/2015   Complete foot exam   12/13/2021   COVID-19 Vaccine (1) 11/12/2022*   Flu Shot  12/07/2022*   Mammogram  12/08/2022*   DEXA scan (bone density measurement)  12/08/2022*   Zoster (Shingles) Vaccine (1 of 2) 01/25/2023*   Screening for Lung Cancer  02/07/2023*   Pneumonia Vaccine (3 of 3 - PPSV23 or PCV20) 02/07/2023*   Eye exam for diabetics  02/07/2023*   Colon Cancer Screening  02/07/2023*   Hemoglobin A1C  01/09/2023   Yearly kidney health urinalysis for diabetes  03/05/2023   Yearly kidney function blood test for diabetes  07/12/2023   Medicare Annual Wellness Visit  10/28/2023   Hepatitis C Screening: USPSTF Recommendation to screen - Ages 18-79 yo.  Completed   HPV Vaccine  Aged Out  *Topic was postponed. The date shown is not the original due date.    Advanced directives: not yet completed.  Conditions/risks identified: none new.  Next appointment: Follow up in one year for your annual wellness visit    Preventive Care 65 Years and Older, Female Preventive care refers to lifestyle choices and visits with your health care provider that can promote health and wellness. What does preventive care include? A yearly physical exam. This is also called an annual well check. Dental exams once or twice a  year. Routine eye exams. Ask your health care provider how often you should have your eyes checked. Personal lifestyle choices, including: Daily care of your teeth and gums. Regular physical activity. Eating a healthy diet. Avoiding tobacco and drug use. Limiting alcohol use. Practicing safe sex. Taking low-dose aspirin every day. Taking vitamin and mineral supplements as recommended by your health care provider. What happens during an annual well check? The services and screenings done by your health care provider during your annual well check will depend on your age, overall health, lifestyle risk factors, and family history of disease. Counseling  Your health care provider may ask you questions about your: Alcohol use. Tobacco use. Drug use. Emotional well-being. Home and relationship well-being. Sexual activity. Eating habits. History of falls. Memory and ability to understand (cognition). Work and work Statistician. Reproductive health. Screening  You may have the following tests or measurements: Height, weight, and BMI. Blood pressure. Lipid and cholesterol levels. These may be checked every 5 years, or more frequently if you are over 37 years old. Skin check. Lung cancer screening. You may have this screening every year starting at age 50 if you have a 30-pack-year history of smoking and currently smoke or have quit within the past 15 years. Fecal occult blood test (FOBT) of the stool. You may have this test every year  starting at age 70. Flexible sigmoidoscopy or colonoscopy. You may have a sigmoidoscopy every 5 years or a colonoscopy every 10 years starting at age 8. Hepatitis C blood test. Hepatitis B blood test. Sexually transmitted disease (STD) testing. Diabetes screening. This is done by checking your blood sugar (glucose) after you have not eaten for a while (fasting). You may have this done every 1-3 years. Bone density scan. This is done to screen for  osteoporosis. You may have this done starting at age 25. Mammogram. This may be done every 1-2 years. Talk to your health care provider about how often you should have regular mammograms. Talk with your health care provider about your test results, treatment options, and if necessary, the need for more tests. Vaccines  Your health care provider may recommend certain vaccines, such as: Influenza vaccine. This is recommended every year. Tetanus, diphtheria, and acellular pertussis (Tdap, Td) vaccine. You may need a Td booster every 10 years. Zoster vaccine. You may need this after age 24. Pneumococcal 13-valent conjugate (PCV13) vaccine. One dose is recommended after age 4. Pneumococcal polysaccharide (PPSV23) vaccine. One dose is recommended after age 45. Talk to your health care provider about which screenings and vaccines you need and how often you need them. This information is not intended to replace advice given to you by your health care provider. Make sure you discuss any questions you have with your health care provider. Document Released: 09/21/2015 Document Revised: 05/14/2016 Document Reviewed: 06/26/2015 Elsevier Interactive Patient Education  2017 [employer] Prevention in the Home Falls can cause injuries. They can happen to people of all ages. There are many things you can do to make your home safe and to help prevent falls. What can I do on the outside of my home? Regularly fix the edges of walkways and driveways and fix any cracks. Remove anything that might make you trip as you walk through a door, such as a raised step or threshold. Trim any bushes or trees on the path to your home. Use bright outdoor lighting. Clear any walking paths of anything that might make someone trip, such as rocks or tools. Regularly check to see if handrails are loose or broken. Make sure that both sides of any steps have handrails. Any raised decks and porches should have guardrails on  the edges. Have any leaves, snow, or ice cleared regularly. Use sand or salt on walking paths during winter. Clean up any spills in your garage right away. This includes oil or grease spills. What can I do in the bathroom? Use night lights. Install grab bars by the toilet and in the tub and shower. Do not use towel bars as grab bars. Use non-skid mats or decals in the tub or shower. If you need to sit down in the shower, use a plastic, non-slip stool. Keep the floor dry. Clean up any water that spills on the floor as soon as it happens. Remove soap buildup in the tub or shower regularly. Attach bath mats securely with double-sided non-slip rug tape. Do not have throw rugs and other things on the floor that can make you trip. What can I do in the bedroom? Use night lights. Make sure that you have a light by your bed that is easy to reach. Do not use any sheets or blankets that are too big for your bed. They should not hang down onto the floor. Have a firm chair that has side arms. You can use this for  support while you get dressed. Do not have throw rugs and other things on the floor that can make you trip. What can I do in the kitchen? Clean up any spills right away. Avoid walking on wet floors. Keep items that you use a lot in easy-to-reach places. If you need to reach something above you, use a strong step stool that has a grab bar. Keep electrical cords out of the way. Do not use floor polish or wax that makes floors slippery. If you must use wax, use non-skid floor wax. Do not have throw rugs and other things on the floor that can make you trip. What can I do with my stairs? Do not leave any items on the stairs. Make sure that there are handrails on both sides of the stairs and use them. Fix handrails that are broken or loose. Make sure that handrails are as long as the stairways. Check any carpeting to make sure that it is firmly attached to the stairs. Fix any carpet that is loose  or worn. Avoid having throw rugs at the top or bottom of the stairs. If you do have throw rugs, attach them to the floor with carpet tape. Make sure that you have a light switch at the top of the stairs and the bottom of the stairs. If you do not have them, ask someone to add them for you. What else can I do to help prevent falls? Wear shoes that: Do not have high heels. Have rubber bottoms. Are comfortable and fit you well. Are closed at the toe. Do not wear sandals. If you use a stepladder: Make sure that it is fully opened. Do not climb a closed stepladder. Make sure that both sides of the stepladder are locked into place. Ask someone to hold it for you, if possible. Clearly mark and make sure that you can see: Any grab bars or handrails. First and last steps. Where the edge of each step is. Use tools that help you move around (mobility aids) if they are needed. These include: Canes. Walkers. Scooters. Crutches. Turn on the lights when you go into a dark area. Replace any light bulbs as soon as they burn out. Set up your furniture so you have a clear path. Avoid moving your furniture around. If any of your floors are uneven, fix them. If there are any pets around you, be aware of where they are. Review your medicines with your doctor. Some medicines can make you feel dizzy. This can increase your chance of falling. Ask your doctor what other things that you can do to help prevent falls. This information is not intended to replace advice given to you by your health care provider. Make sure you discuss any questions you have with your health care provider. Document Released: 06/21/2009 Document Revised: 01/31/2016 Document Reviewed: 09/29/2014 Elsevier Interactive Patient Education  2017 [employer].

## 2022-10-27 NOTE — Progress Notes (Addendum)
Subjective:   Catherine [name] is a 67 y.o. female who presents for Medicare Annual (Subsequent) preventive examination.  Review of Systems    No ROS.  Medicare Wellness Virtual Visit.  Visual/audio telehealth visit, UTA vital signs.   See social history for additional risk factors.   Cardiac Risk Factors include: advanced age (>59mn, >>7women)     Objective:    Today's Vitals   10/27/22 1542  Weight: 238 lb (108 kg)  Height: '5\' 1"'$  (1.549 m)   Body mass index is 44.97 kg/m.     10/27/2022    3:48 PM 06/19/2022    9:39 AM 10/23/2021    3:40 PM 02/21/2020   10:02 AM 01/19/2020    3:47 AM 01/16/2020    7:36 PM 01/07/2020    5:00 PM  Advanced Directives  Does Patient Have a Medical Advance Directive? No No No No No No No  Would patient like information on creating a medical advance directive? No - Patient declined No - Patient declined No - Patient declined No - Patient declined No - Patient declined  No - Patient declined    Current Medications (verified) Outpatient Encounter Medications as of 10/27/2022  Medication Sig   albuterol (VENTOLIN HFA) 108 (90 Base) MCG/ACT inhaler INHALE 1-2 PUFFS BY MOUTH EVERY 6 HOURS AS NEEDED FOR WHEEZE OR SHORTNESS OF BREATH   ALPRAZolam (XANAX) 0.25 MG tablet Take 1 tablet (0.25 mg total) by mouth at bedtime as needed for anxiety.   aspirin 81 MG EC tablet Take 81 mg by mouth daily.   Budeson-Glycopyrrol-Formoterol (BREZTRI AEROSPHERE) 160-9-4.8 MCG/ACT AERO Inhale 2 puffs into the lungs 2 (two) times daily.   buPROPion (WELLBUTRIN XL) 150 MG 24 hr tablet TAKE 1 TABLET BY MOUTH EVERY DAY   Cholecalciferol (VITAMIN D3) 125 MCG (5000 UT) CAPS Take 1,000 Units by mouth daily.    hydrochlorothiazide (HYDRODIURIL) 25 MG tablet Take 1 tablet (25 mg total) by mouth daily.   insulin aspart (NOVOLOG FLEXPEN) 100 UNIT/ML FlexPen Inject 16 Units into the skin 3 (three) times daily with meals.   insulin degludec (TRESIBA FLEXTOUCH) 200 UNIT/ML  FlexTouch Pen Inject 20 Units into the skin daily.   ipratropium-albuterol (DUONEB) 0.5-2.5 (3) MG/3ML SOLN Take 3 mLs by nebulization every 6 (six) hours as needed.   Lactobacillus (PROBIOTIC ACIDOPHILUS PO) Take 1 capsule by mouth daily.   lactulose (CHRONULAC) 10 GM/15ML solution TAKE 30 ML EVERY 4 HOURS UNTIL CONSTIPATION IS RELIEVED   losartan (COZAAR) 100 MG tablet Take 1 tablet (100 mg total) by mouth daily.   MAGNESIUM CITRATE PO Take 1 tablet by mouth daily.   Melatonin 5 MG TABS Take 5 mg by mouth at bedtime.   metFORMIN (GLUCOPHAGE) 1000 MG tablet TAKE 1 TABLET (1,000 MG TOTAL) BY MOUTH TWICE A DAY WITH FOOD   metoprolol succinate (TOPROL-XL) 25 MG 24 hr tablet TAKE 1 TABLET BY MOUTH EVERY DAY   PARoxetine (PAXIL) 20 MG tablet TAKE 1 TABLET BY MOUTH EVERY DAY IN THE MORNING   Potassium 99 MG TABS Take 99 mg by mouth daily. Reported on 09/20/2015   promethazine (PHENERGAN) 12.5 MG tablet TAKE 1 TABLET (12.5 MG TOTAL) BY MOUTH AT BEDTIME AS NEEDED FOR NAUSEA OR VOMITING.   rosuvastatin (CRESTOR) 40 MG tablet TAKE 1 TABLET BY MOUTH EVERY DAY   Semaglutide, 1 MG/DOSE, (OZEMPIC, 1 MG/DOSE,) 4 MG/3ML SOPN Inject 1 mg into the skin once a week. (Patient not taking: Reported on 08/13/2022)   Semaglutide,0.25 or 0.'5MG'$ /DOS, (  OZEMPIC, 0.25 OR 0.5 MG/DOSE,) 2 MG/3ML SOPN Inject 0.25 mg weekly for 4 weeks then increase to 0.5 mg weekly   tiZANidine (ZANAFLEX) 4 MG tablet Take 1 tablet (4 mg total) by mouth every 6 (six) hours as needed for muscle spasms.   No facility-administered encounter medications on file as of 10/27/2022.    Allergies (verified) Sulfa antibiotics, Hydroxychloroquine, Oxycodone-acetaminophen, Percocet [oxycodone-acetaminophen], Vicodin [hydrocodone-acetaminophen], Aspirin, Ibuprofen, and Omnipaque [iohexol]   History: Past Medical History:  Diagnosis Date   Anxiety    Asthma    COPD (chronic obstructive pulmonary disease) ([place])    COPD exacerbation ([place]) 08/10/2011    Exacerbation 12/2011 June 2013 mMRC 2 July PFT Ratio 50%, FEV1 0.91 L, 41% pred, DLCO 51% pred, TLC 3.39 L 72% pred Exacerbation 04/2012, admitted [facility]   Depression    Diabetes mellitus    Gastritis and duodenitis June 2011   EGD   GERD (gastroesophageal reflux disease)    Hyperglycemia    Hyperlipidemia    Hypertension    Leukocytosis    Obesity (BMI 30-39.9)    S/P cardiac catheterization March 2012   normal coronaries,  due to chest pain [place])   Screening for breast cancer Jul 28 2011   normal   Screening for colon cancer June 2011   polyps found, next one due 2014 ([place])   Tobacco abuse, in remission    quit oct during hospitalization    Past Surgical History:  Procedure Laterality Date   [place]   and TAH.  noncancerous reasons   CARDIAC CATHETERIZATION  11/22/2010   no significant disease   Family History  Problem Relation Age of Onset   Coronary artery disease Mother 1   Heart disease Mother        CABG x5   COPD Mother 72   Breast cancer Mother    Kidney disease Mother    Heart failure Father    Angina Father    Diabetes Father    Depression Father    Stroke Father    Dementia Father    Cancer Sister        breast, lung, brain   Asthma Brother    Asthma Brother    Diabetes Brother    Hypertension Brother    Social History   Socioeconomic History   Marital status: Married    Spouse name: Not on file   Number of children: 4   Years of education: Not on file   Highest education level: Not on file  Occupational History   Occupation: Producer, television/film/video: [employer]  Tobacco Use   Smoking status: Former    Packs/day: 1.00    Years: 40.00    Total pack years: 40.00    Types: Cigarettes    Quit date: 06/27/2011    Years since quitting: 11.3   Smokeless tobacco: Never  Vaping Use   Vaping Use: Never used  Substance and Sexual Activity   Alcohol use: No   Drug use: No    Sexual activity: Never  Other Topics Concern   Not on file  Social History Narrative   Not on file   Social Determinants of Health   Financial Resource Strain: Low Risk  (10/27/2022)   Overall Financial Resource Strain (CARDIA)    Difficulty of Paying Living Expenses: Not very hard  Food Insecurity: No Food Insecurity (10/27/2022)   Hunger Vital Sign  Worried About Charity fundraiser in the Last Year: Never true    [place] in the Last Year: Never true  Transportation Needs: No Transportation Needs (10/27/2022)   PRAPARE - Hydrologist (Medical): No    Lack of Transportation (Non-Medical): No  Physical Activity: Insufficiently Active (10/27/2022)   Exercise Vital Sign    Days of Exercise per Week: 3 days    Minutes of Exercise per Session: 10 min  Stress: No Stress Concern Present (10/27/2022)   [place]    Feeling of Stress : Not at all  Social Connections: Unknown (10/27/2022)   Social Connection and Isolation Panel [NHANES]    Frequency of Communication with Friends and Family: Not on file    Frequency of Social Gatherings with Friends and Family: Not on file    Attends Religious Services: Not on file    Active Member of Clubs or Organizations: Not on file    Attends Archivist Meetings: Not on file    Marital Status: Married    Tobacco Counseling Counseling given: Not Answered   Clinical Intake:  Pre-visit preparation completed: Yes        Diabetes: Yes (Followed by pcp)  How often do you need to have someone help you when you read instructions, pamphlets, or other written materials from your doctor or pharmacy?: 1 - Never  Nutrition Risk Assessment: Has the patient had any N/V/D within the last 2 months?  No  Does the patient have any non-healing wounds?  No  Has the patient had any unintentional weight loss or weight gain?  No   Diabetes: Is the  patient diabetic?  Yes  If diabetic, was a CBG obtained today?  Yes , FBS 89 Did the patient bring in their glucometer from home?  No  How often do you monitor your CBG's? 3-4 daily.   Financial Strains and Diabetes Management: Are you having any financial strains with the device, your supplies or your medication? No .  Does the patient want to be seen by Chronic Care Management for management of their diabetes?  No  Would the patient like to be referred to a Nutritionist or for Diabetic Management?  No     Interpreter Needed?: No      Activities of Daily Living    10/27/2022    3:49 PM  In your present state of health, do you have any difficulty performing the following activities:  Hearing? 0  Vision? 0  Difficulty concentrating or making decisions? 0  Walking or climbing stairs? 0  Dressing or bathing? 0  Doing errands, shopping? 0  Preparing Food and eating ? N  Comment Husband assist with meal prep. Self feeds.  Using the Toilet? N  In the past six months, have you accidently leaked urine? N  Do you have problems with loss of bowel control? N  Managing your Medications? N  Managing your Finances? N  Housekeeping or managing your Housekeeping? N  Comment Husband assist.    Patient Care Team: Crecencio Mc, MD as PCP - General (Internal Medicine) Osker Mason, RPH-CPP (Pharmacist)  Indicate any recent Medical Services you may have received from other than [facility] providers in the past year (date may be approximate).     Assessment:   This is a routine wellness examination for Catherine [name].  I connected with  Catherine [name] on 10/27/22 by a audio enabled telemedicine  application and verified that I am speaking with the correct person using two identifiers.  Patient Location: Home  Provider Location: Office/Clinic  I discussed the limitations of evaluation and management by telemedicine. The patient expressed understanding and agreed to proceed.    Hearing/Vision screen Hearing Screening - Comments:: Patient is able to hear conversational tones without difficulty.  No issues reported.   Vision Screening - Comments:: Followed by [facility]  Wears corrective lenses  Diabetic eye exam Glaucoma suspect  No retinopathy reported   Dietary issues and exercise activities discussed: Current Exercise Habits: Home exercise routine, Type of exercise: stretching;Other - see comments (Home care exercises. Chair exercises.), Time (Minutes): 10, Frequency (Times/Week): 3, Weekly Exercise (Minutes/Week): 30, Intensity: Mild   Goals Addressed               This Visit's Progress     Patient Stated     Increase physical activity (pt-stated)   On track     Chair exercises  Leg lifts Arm raises Range of motion exercises       Depression Screen    10/27/2022    3:49 PM 08/13/2022    4:39 PM 07/11/2022    1:32 PM 03/04/2022    1:18 PM 10/23/2021    3:39 PM 02/21/2020    9:54 AM 09/14/2019    2:50 PM  PHQ 2/9 Scores  PHQ - 2 Score 0 0 6 2 0 0 0  PHQ- 9 Score  0 15 2       Fall Risk    10/27/2022    3:49 PM 08/13/2022    4:39 PM 07/11/2022   11:18 AM 03/04/2022    1:18 PM 12/13/2020   11:29 AM  [place] in the past year? 0 1 1 0 0  Number falls in past yr: 0 1 0 0   Injury with Fall? 0 0 0 0   Risk for fall due to :  History of fall(s) History of fall(s) No Fall Risks   Follow up Falls evaluation completed;Falls prevention discussed Falls evaluation completed Falls evaluation completed Falls evaluation completed Falls evaluation completed    FALL RISK PREVENTION PERTAINING TO THE HOME: Home free of loose throw rugs in walkways, pet beds, electrical cords, etc? Yes  Adequate lighting in your home to reduce risk of falls? Yes   ASSISTIVE DEVICES UTILIZED TO PREVENT FALLS: Life alert? No  Use of a cane, walker or w/c? No  Grab bars in the bathroom? Yes  Shower chair or bench in shower? No  Elevated toilet seat  or a handicapped toilet? No   TIMED UP AND GO: Was the test performed? No .   Cognitive Function:    02/16/2018    4:53 PM  MMSE - Mini Mental State Exam  Orientation to time 5  Orientation to Place 5  Registration 3  Attention/ Calculation 5  Recall 3  Language- name 2 objects 2  Language- repeat 1  Language- follow 3 step command 3  Language- read & follow direction 1  Write a sentence 1  Copy design 1  Total score 30        10/27/2022    3:53 PM 02/21/2020   10:03 AM 02/18/2019    9:46 AM 08/08/2016    4:33 PM  6CIT Screen  What Year? 0 points 0 points 0 points 0 points  What month? 0 points 0 points 0 points 0 points  What time? 0 points  0 points 0 points  Count back from 20 0 points  0 points 0 points  Months in reverse 0 points 0 points 0 points 0 points  Repeat phrase 0 points 0 points  0 points  Total Score 0 points   0 points    Immunizations Immunization History  Administered Date(s) Administered   Fluad Quad(high Dose 65+) 06/26/2020   Hepatitis B 01/21/2012   Influenza Whole 07/10/2011, 08/31/2012   Influenza,inj,Quad PF,6+ Mos 06/12/2014, 10/06/2016, 09/16/2018   Pneumococcal Conjugate-13 06/12/2014   Pneumococcal Polysaccharide-23 12/08/2011   Tdap 12/22/2005    TDAP status: Due, Education has been provided regarding the importance of this vaccine. Advised may receive this vaccine at local pharmacy or Health Dept. Aware to provide a copy of the vaccination record if obtained from local pharmacy or Health Dept. Verbalized acceptance and understanding.  Pneumococcal vaccine status: Due, Education has been provided regarding the importance of this vaccine. Advised may receive this vaccine at local pharmacy or Health Dept. Aware to provide a copy of the vaccination record if obtained from local pharmacy or Health Dept. Verbalized acceptance and understanding.  Covid-19 vaccine status: Declined, Education has been provided regarding the importance of this  vaccine but patient still declined. Advised may receive this vaccine at local pharmacy or Health Dept.or vaccine clinic. Aware to provide a copy of the vaccination record if obtained from local pharmacy or Health Dept. Verbalized acceptance and understanding.    Shingrix Completed?: No.    Education has been provided regarding the importance of this vaccine. Patient has been advised to call insurance company to determine out of pocket expense if they have not yet received this vaccine. Advised may also receive vaccine at local pharmacy or Health Dept. Verbalized acceptance and understanding.  Screening Tests Health Maintenance  Topic Date Due   DTaP/Tdap/Td (2 - Td or Tdap) 12/23/2015   FOOT EXAM  12/13/2021   COVID-19 Vaccine (1) 11/12/2022 (Originally 01/28/1957)   INFLUENZA VACCINE  12/07/2022 (Originally 04/08/2022)   MAMMOGRAM  12/08/2022 (Originally 08/19/2018)   DEXA SCAN  12/08/2022 (Originally 07/31/2019)   Zoster Vaccines- Shingrix (1 of 2) 01/25/2023 (Originally 07/30/2004)   Lung Cancer Screening  02/07/2023 (Originally 01/18/2021)   Pneumonia Vaccine 7+ Years old (29 of 3 - PPSV23 or PCV20) 02/07/2023 (Originally 07/31/2019)   OPHTHALMOLOGY EXAM  02/07/2023 (Originally 01/29/2017)   COLONOSCOPY (Pts 45-15yr Insurance coverage will need to be confirmed)  02/07/2023 (Originally 09/25/2020)   HEMOGLOBIN A1C  01/09/2023   Diabetic kidney evaluation - Urine ACR  03/05/2023   Diabetic kidney evaluation - eGFR measurement  07/12/2023   Medicare Annual Wellness (AWV)  10/28/2023   Hepatitis C Screening  Completed   HPV VACCINES  Aged Out    Health Maintenance  Health Maintenance Due  Topic Date Due   DTaP/Tdap/Td (2 - Td or Tdap) 12/23/2015   FOOT EXAM  12/13/2021   Colonoscopy- declined.  Mammogram- declined.   Bone density- declined.   Lung Cancer Screening: declined per patient.   Hepatitis C Screening: Completed 07/11/20.   Vision Screening: Recommended annual  ophthalmology exams for early detection of glaucoma and other disorders of the eye. Agrees to schedule eye exam and update pcp once completed.   Dental Screening: Recommended annual dental exams for proper oral hygiene  Community Resource Referral / Chronic Care Management: CRR required this visit?  No   CCM required this visit?  No      Plan:   Agrees to schedule routine maintenance follow up  visit with pcp.   I have personally reviewed and noted the following in the patient's chart:   Medical and social history Use of alcohol, tobacco or illicit drugs  Current medications and supplements including opioid prescriptions. Patient is not currently taking opioid prescriptions. Functional ability and status Nutritional status Physical activity Advanced directives List of other physicians Hospitalizations, surgeries, and ER visits in previous 12 months Vitals Screenings to include cognitive, depression, and falls Referrals and appointments  In addition, I have reviewed and discussed with patient certain preventive protocols, quality metrics, and best practice recommendations. A written personalized care plan for preventive services as well as general preventive health recommendations were provided to patient.     [place], LPN   [id number]     I have reviewed the above information and agree with above.   Deborra Medina, MD

## 2022-11-05 DIAGNOSIS — E1165 Type 2 diabetes mellitus with hyperglycemia: Secondary | ICD-10-CM | POA: Diagnosis not present

## 2022-11-13 ENCOUNTER — Other Ambulatory Visit: Payer: Self-pay | Admitting: Internal Medicine

## 2022-11-13 NOTE — Telephone Encounter (Signed)
Received notification from [facility] regarding STATUS for TRESIBA, NOVOLOG, [place] PENDING. Provider did not sign page 4. I have sign on provider behalf and resent back to company for processing.  [facility] Patient Advocate [phone]

## 2022-11-16 ENCOUNTER — Other Ambulatory Visit: Payer: Self-pay | Admitting: Internal Medicine

## 2022-11-17 ENCOUNTER — Other Ambulatory Visit: Payer: Self-pay

## 2022-11-17 ENCOUNTER — Telehealth: Payer: Self-pay | Admitting: Internal Medicine

## 2022-11-17 MED ORDER — IPRATROPIUM-ALBUTEROL 0.5-2.5 (3) MG/3ML IN SOLN: 3.0000 mL | Freq: Four times a day (QID) | RESPIRATORY_TRACT | 2 refills | Status: DC | PRN

## 2022-11-17 NOTE — Telephone Encounter (Signed)
sent 

## 2022-11-17 NOTE — Telephone Encounter (Signed)
Prescription Request  11/17/2022  LOV: 08/13/2022  What is the name of the medication or equipment? ipratropium-albuterol (DUONEB) 0.5-2.5 (3) MG/3ML SOLN  Have you contacted your pharmacy to request a refill? Yes   Which pharmacy would you like this sent to?    [facility] [place], [place] [address] AT [place] [address] Phone: [phone] Fax: [phone]   Patient notified that their request is being sent to the clinical staff for review and that they should receive a response within 2 business days.   Please advise at [facility] [phone]

## 2022-11-17 NOTE — Telephone Encounter (Signed)
Patient called and wanted Dr Derrel Nip to know that patient had to call the ambulance a week ago, because she took a muscle relaxer and her bp dropped, and kept on dropping.

## 2022-11-17 NOTE — Telephone Encounter (Signed)
Pt sched to discuss episode 3/18

## 2022-11-17 NOTE — Telephone Encounter (Addendum)
Pt called to check on the the status of TRESIBA AND OZEPIC PT STATED SHE IS RUNNING OUT OF MEDS. and her sugar levels was up...( I told pt to give office a call to see if they had samples) I called [facility] to see if they had receive the documents that sent on 11/13/2022 and that is when they informed me that her insurance card was missing, so I have sent in for 2nd time. They said to call Friday to follow up 11/21/2022. I made pt aware.   [facility] Patient Advocate [phone]

## 2022-11-24 ENCOUNTER — Encounter: Payer: Self-pay | Admitting: Internal Medicine

## 2022-11-24 ENCOUNTER — Telehealth: Payer: Self-pay

## 2022-11-24 ENCOUNTER — Ambulatory Visit (INDEPENDENT_AMBULATORY_CARE_PROVIDER_SITE_OTHER): Payer: Medicare HMO | Admitting: Internal Medicine

## 2022-11-24 VITALS — BP 136/70 | HR 95 | Temp 97.8°F | Ht 61.0 in | Wt 240.2 lb

## 2022-11-24 DIAGNOSIS — E1140 Type 2 diabetes mellitus with diabetic neuropathy, unspecified: Secondary | ICD-10-CM | POA: Diagnosis not present

## 2022-11-24 DIAGNOSIS — K5900 Constipation, unspecified: Secondary | ICD-10-CM

## 2022-11-24 DIAGNOSIS — R29898 Other symptoms and signs involving the musculoskeletal system: Secondary | ICD-10-CM | POA: Diagnosis not present

## 2022-11-24 DIAGNOSIS — E782 Mixed hyperlipidemia: Secondary | ICD-10-CM

## 2022-11-24 DIAGNOSIS — E669 Obesity, unspecified: Secondary | ICD-10-CM

## 2022-11-24 DIAGNOSIS — C4359 Malignant melanoma of other part of trunk: Secondary | ICD-10-CM | POA: Insufficient documentation

## 2022-11-24 DIAGNOSIS — E1129 Type 2 diabetes mellitus with other diabetic kidney complication: Secondary | ICD-10-CM

## 2022-11-24 DIAGNOSIS — R809 Proteinuria, unspecified: Secondary | ICD-10-CM | POA: Diagnosis not present

## 2022-11-24 DIAGNOSIS — I10 Essential (primary) hypertension: Secondary | ICD-10-CM | POA: Diagnosis not present

## 2022-11-24 DIAGNOSIS — D492 Neoplasm of unspecified behavior of bone, soft tissue, and skin: Secondary | ICD-10-CM | POA: Diagnosis not present

## 2022-11-24 DIAGNOSIS — F330 Major depressive disorder, recurrent, mild: Secondary | ICD-10-CM

## 2022-11-24 DIAGNOSIS — G839 Paralytic syndrome, unspecified: Secondary | ICD-10-CM | POA: Diagnosis not present

## 2022-11-24 DIAGNOSIS — J449 Chronic obstructive pulmonary disease, unspecified: Secondary | ICD-10-CM | POA: Diagnosis not present

## 2022-11-24 LAB — COMPREHENSIVE METABOLIC PANEL
ALT: 14 U/L (ref 0–35)
AST: 29 U/L (ref 0–37)
Albumin: 3.8 g/dL (ref 3.5–5.2)
Alkaline Phosphatase: 79 U/L (ref 39–117)
BUN: 13 mg/dL (ref 6–23)
CO2: 36 mEq/L — ABNORMAL HIGH (ref 19–32)
Calcium: 9.5 mg/dL (ref 8.4–10.5)
Chloride: 92 mEq/L — ABNORMAL LOW (ref 96–112)
Creatinine, Ser: 0.89 mg/dL (ref 0.40–1.20)
GFR: 66.66 mL/min (ref >60.00)
Glucose, Bld: 162 mg/dL — ABNORMAL HIGH (ref 70–99)
Potassium: 3.8 mEq/L (ref 3.5–5.1)
Sodium: 139 mEq/L (ref 135–145)
Total Bilirubin: 0.4 mg/dL (ref 0.2–1.2)
Total Protein: 7.4 g/dL (ref 6.0–8.3)

## 2022-11-24 LAB — LIPID PANEL
Cholesterol: 149 mg/dL (ref 0–200)
HDL: 46.9 mg/dL (ref >39.00)
NonHDL: 101.68
Total CHOL/HDL Ratio: 3
Triglycerides: 215 mg/dL — ABNORMAL HIGH (ref 0.0–149.0)
VLDL: 43 mg/dL — ABNORMAL HIGH (ref 0.0–40.0)

## 2022-11-24 LAB — MICROALBUMIN / CREATININE URINE RATIO
Creatinine,U: 87.8 mg/dL
Microalb Creat Ratio: 2.9 mg/g (ref 0.0–30.0)
Microalb, Ur: 2.5 mg/dL — ABNORMAL HIGH (ref 0.0–1.9)

## 2022-11-24 LAB — POCT GLYCOSYLATED HEMOGLOBIN (HGB A1C): Hemoglobin A1C: 7.8 % — AB (ref 4.0–5.6)

## 2022-11-24 LAB — LDL CHOLESTEROL, DIRECT: Direct LDL: 69 mg/dL

## 2022-11-24 MED ORDER — CYCLOBENZAPRINE HCL 5 MG PO TABS: 5.0000 mg | ORAL_TABLET | Freq: Three times a day (TID) | ORAL | 1 refills | Status: DC | PRN

## 2022-11-24 NOTE — Assessment & Plan Note (Signed)
Improved with  increasing dose of paroxetine to 20 mg daily to manage symptoms of anhedonia and anxiety

## 2022-11-24 NOTE — Assessment & Plan Note (Addendum)
Loss of control noted in  November. With only 25% of sugars in range ;  however current A1c and CBG downloead suggest she needs to resume  ozempic and  tresiba which she has been without  for the last month.  She will resume [place] at 15 units daily   and reusum ozempic at 0.25 mg weekly dose . She is taking an ACE Inhibitor  with reduction in proteinuria.  Lab Results  Component Value Date   MICROALBUR 2.5 (H) 11/24/2022   MICROALBUR 6.0 (H) 03/04/2022    '

## 2022-11-24 NOTE — Progress Notes (Addendum)
Subjective:  Patient ID: Catherine [name], female    DOB: [birth year]  Age: 67 y.o. MRN: [id number]  CC: The primary encounter diagnosis was Type 2 diabetes mellitus with diabetic neuropathy, unspecified whether long term insulin use (HCC). Diagnoses of Primary hypertension, Mixed hyperlipidemia, Proximal leg weakness, Neoplasm of skin of back, Obstipation, Obesity (BMI 30-39.9), Microalbuminuria due to type 2 diabetes mellitus (HCC), Mild episode of recurrent major depressive disorder (HCC), and Skin neoplasm were also pertinent to this visit.   HPI Catherine [name] presents for follow up on type 2 DM . Advance hypoxic respiratory  Chief Complaint  Patient presents with   Medical Management of Chronic Issues    discuss blood pressure dropping to low   1)  Type 2 DM : only taking Novolog insulin  16 units .  Has been out of ozempic and TresiBa for MONTHS although she   received a sample box of each in January but has run out   (0.25 mg ozempic).  She uses a CBG.  I have downloaded and reviewed the data from patient's continuous blood glucose monitor. Patient's  sugars have been  IN RANGE  25   % OF THE TiME,   BELOW RANGE   0 % of the time.  And ABOVE RANGE  75% OF THE TIME .        2) irritated SK on back has been bleeding due to recurrent scratching at it. Several months . Located under her bra strap. Has not seen dermatology  3)  stopped  using tizanidine  for back pain due to recurrent hypotension and weakness    4) current 128/80 to 134/70  5) right sided back pain with muscle spasms.  Has not tried heating pad.  Did not tolerate tizanidine de to hypotension .  Waking up with mid section in spasm.  New gel  mattress   6)  GAD: using alprazolam 3- times weekly for insomnia  7)  chronic constipation managed with lactulose taken every  2-3 days   Outpatient Medications Prior to Visit  Medication Sig Dispense Refill   albuterol (VENTOLIN HFA) 108 (90 Base) MCG/ACT inhaler INHALE  1-2 PUFFS BY MOUTH EVERY 6 HOURS AS NEEDED FOR WHEEZE OR SHORTNESS OF BREATH 18 each 3   ALPRAZolam (XANAX) 0.25 MG tablet Take 1 tablet (0.25 mg total) by mouth at bedtime as needed for anxiety. 30 tablet 5   aspirin 81 MG EC tablet Take 81 mg by mouth daily.     Budeson-Glycopyrrol-Formoterol (BREZTRI AEROSPHERE) 160-9-4.8 MCG/ACT AERO Inhale 2 puffs into the lungs 2 (two) times daily. 32.1 g 3   Cholecalciferol (VITAMIN D3) 125 MCG (5000 UT) CAPS Take 1,000 Units by mouth daily.      hydrochlorothiazide (HYDRODIURIL) 25 MG tablet Take 1 tablet (25 mg total) by mouth daily. 90 tablet 3   insulin aspart (NOVOLOG FLEXPEN) 100 UNIT/ML FlexPen Inject 16 Units into the skin 3 (three) times daily with meals. 15 mL 11   insulin degludec (TRESIBA FLEXTOUCH) 200 UNIT/ML FlexTouch Pen Inject 20 Units into the skin daily. 3 mL 0   ipratropium-albuterol (DUONEB) 0.5-2.5 (3) MG/3ML SOLN Take 3 mLs by nebulization every 6 (six) hours as needed. 360 mL 2   Lactobacillus (PROBIOTIC ACIDOPHILUS PO) Take 1 capsule by mouth daily.     lactulose (CHRONULAC) 10 GM/15ML solution TAKE 30 ML EVERY 4 HOURS UNTIL CONSTIPATION IS RELIEVED 473 mL 1   losartan (COZAAR) 100 MG tablet Take 1 tablet (100 mg total) by  mouth daily. 90 tablet 1   MAGNESIUM CITRATE PO Take 1 tablet by mouth daily.     Melatonin 5 MG TABS Take 5 mg by mouth at bedtime.     metFORMIN (GLUCOPHAGE) 1000 MG tablet TAKE 1 TABLET (1,000 MG TOTAL) BY MOUTH TWICE A DAY WITH FOOD 180 tablet 1   metoprolol succinate (TOPROL-XL) 25 MG 24 hr tablet TAKE 1 TABLET BY MOUTH EVERY DAY 90 tablet 3   PARoxetine (PAXIL) 20 MG tablet TAKE 1 TABLET BY MOUTH EVERY DAY IN THE MORNING 90 tablet 1   Potassium 99 MG TABS Take 99 mg by mouth daily. Reported on 09/20/2015     promethazine (PHENERGAN) 12.5 MG tablet TAKE 1 TABLET (12.5 MG TOTAL) BY MOUTH AT BEDTIME AS NEEDED FOR NAUSEA OR VOMITING. 30 tablet 0   rosuvastatin (CRESTOR) 40 MG tablet TAKE 1 TABLET BY MOUTH EVERY  DAY 90 tablet 3   Semaglutide,0.25 or 0.5MG /DOS, (OZEMPIC, 0.25 OR 0.5 MG/DOSE,) 2 MG/3ML SOPN Inject 0.25 mg weekly for 4 weeks then increase to 0.5 mg weekly 3 mL 0   Semaglutide, 1 MG/DOSE, (OZEMPIC, 1 MG/DOSE,) 4 MG/3ML SOPN Inject 1 mg into the skin once a week. 9 mL 3   tiZANidine (ZANAFLEX) 4 MG tablet Take 1 tablet (4 mg total) by mouth every 6 (six) hours as needed for muscle spasms. 30 tablet 0   buPROPion (WELLBUTRIN XL) 150 MG 24 hr tablet TAKE 1 TABLET BY MOUTH EVERY DAY (Patient not taking: Reported on 11/24/2022) 90 tablet 1   No facility-administered medications prior to visit.    Review of Systems;  Patient denies headache, fevers, malaise, unintentional weight loss, skin rash, eye pain, sinus congestion and sinus pain, sore throat, dysphagia,  hemoptysis , cough, dyspnea, wheezing, chest pain, palpitations, orthopnea, edema, abdominal pain, nausea, melena, diarrhea, constipation, flank pain, dysuria, hematuria, urinary  Frequency, nocturia, numbness, tingling, seizures,  Focal weakness, Loss of consciousness,  Tremor, insomnia, depression, anxiety, and suicidal ideation.      Objective:  BP 136/70   Pulse 95   Temp 97.8 F (36.6 C) (Oral)   Ht 5\' 1"  (1.549 m)   Wt 240 lb 3.2 oz (109 kg)   SpO2 93% Comment: 4 L O2  BMI 45.39 kg/m   BP Readings from Last 3 Encounters:  11/24/22 136/70  08/19/22 121/78  07/11/22 (!) 158/86    Wt Readings from Last 3 Encounters:  11/24/22 240 lb 3.2 oz (109 kg)  10/27/22 238 lb (108 kg)  08/13/22 238 lb (108 kg)    Physical Exam Vitals reviewed.  Constitutional:      General: She is not in acute distress.    Appearance: Normal appearance. She is normal weight. She is not ill-appearing, toxic-appearing or diaphoretic.  HENT:     Head: Normocephalic.  Eyes:     General: No scleral icterus.       Right eye: No discharge.        Left eye: No discharge.     Conjunctiva/sclera: Conjunctivae normal.  Cardiovascular:     Rate  and Rhythm: Normal rate and regular rhythm.     Heart sounds: Normal heart sounds.  Pulmonary:     Effort: Pulmonary effort is normal. No prolonged expiration, respiratory distress or retractions.     Breath sounds: No stridor. Examination of the right-upper field reveals decreased breath sounds. Examination of the left-upper field reveals decreased breath sounds. Examination of the right-middle field reveals decreased breath sounds. Examination of the left-middle  field reveals decreased breath sounds. Examination of the right-lower field reveals decreased breath sounds. Examination of the left-lower field reveals decreased breath sounds. Decreased breath sounds present.  Musculoskeletal:        General: Normal range of motion.  Skin:    General: Skin is warm and dry.  Neurological:     General: No focal deficit present.     Mental Status: She is alert and oriented to person, place, and time. Mental status is at baseline.  Psychiatric:        Mood and Affect: Mood normal.        Behavior: Behavior normal.        Thought Content: Thought content normal.        Judgment: Judgment normal.    Lab Results  Component Value Date   HGBA1C 7.8 (A) 11/24/2022   HGBA1C 8.2 (H) 07/11/2022   HGBA1C 6.8 (H) 02/28/2022    Lab Results  Component Value Date   CREATININE 0.89 11/24/2022   CREATININE 0.76 07/11/2022   CREATININE 0.93 06/19/2022    Lab Results  Component Value Date   WBC 12.9 (H) 06/19/2022   HGB 13.7 06/19/2022   HCT 43.1 06/19/2022   PLT 283 06/19/2022   GLUCOSE 162 (H) 11/24/2022   CHOL 149 11/24/2022   TRIG 215.0 (H) 11/24/2022   HDL 46.90 11/24/2022   LDLDIRECT 69.0 11/24/2022   LDLCALC 45 12/13/2020   ALT 14 11/24/2022   AST 29 11/24/2022   NA 139 11/24/2022   K 3.8 11/24/2022   CL 92 (L) 11/24/2022   CREATININE 0.89 11/24/2022   BUN 13 11/24/2022   CO2 36 (H) 11/24/2022   TSH 1.10 01/12/2020   INR 0.9 07/22/2012   HGBA1C 7.8 (A) 11/24/2022   MICROALBUR 2.5  (H) 11/24/2022    ECHOCARDIOGRAM COMPLETE  Result Date: 01/20/2020    ECHOCARDIOGRAM REPORT   Patient Name:   [facility] Date of Exam: 01/19/2020 Medical Rec #:  [id number]         Height:       63.0 in Accession #:    [id number]        Weight:       275.0 lb Date of Birth:  [birth year]        BSA:          2.214 m Patient Age:    63 years          BP:           172/95 mmHg Patient Gender: F                 HR:           81 bpm. Exam Location:  [facility] Procedure: 2D Echo, Color Doppler, Cardiac Doppler and Intracardiac            Opacification Agent Indications:     R00.2 Palpitations  History:         Patient has no prior history of Echocardiogram examinations.                  COPD; Risk Factors:Hypertension, Dyslipidemia, Diabetes and                  Former Smoker.  Sonographer:     Humphrey Rolls RDCS (AE) Referring Phys:  [id number] Caryn Bee PACKER Diagnosing Phys: Adrian Blackwater MD  Sonographer Comments: Technically difficult study due to poor echo windows. Image acquisition challenging due to patient body habitus and Image acquisition  challenging due to COPD. IMPRESSIONS  1. Left ventricular ejection fraction, by estimation, is 55 to 60%. The left ventricle has normal function. The left ventricle has no regional wall motion abnormalities. Left ventricular diastolic parameters are consistent with Grade I diastolic dysfunction (impaired relaxation).  2. Right ventricular systolic function is normal. The right ventricular size is normal.  3. Left atrial size was mildly dilated.  4. Right atrial size was mildly dilated.  5. The mitral valve was not assessed. No evidence of mitral valve regurgitation. No evidence of mitral stenosis.  6. The aortic valve is normal in structure. Aortic valve regurgitation is not visualized. No aortic stenosis is present.  7. The inferior vena cava is normal in size with greater than 50% respiratory variability, suggesting right atrial pressure of 3 mmHg. FINDINGS  Left Ventricle:  Left ventricular ejection fraction, by estimation, is 55 to 60%. The left ventricle has normal function. The left ventricle has no regional wall motion abnormalities. Definity contrast agent was given IV to delineate the left ventricular  endocardial borders. The left ventricular internal cavity size was normal in size. There is no left ventricular hypertrophy. Left ventricular diastolic parameters are consistent with Grade I diastolic dysfunction (impaired relaxation). Right Ventricle: The right ventricular size is normal. No increase in right ventricular wall thickness. Right ventricular systolic function is normal. Left Atrium: Left atrial size was mildly dilated. Right Atrium: Right atrial size was mildly dilated. Pericardium: There is no evidence of pericardial effusion. Mitral Valve: The mitral valve was not assessed. Normal mobility of the mitral valve leaflets. No evidence of mitral valve regurgitation. No evidence of mitral valve stenosis. MV peak gradient, 5.9 mmHg. The mean mitral valve gradient is 2.0 mmHg. Tricuspid Valve: The tricuspid valve is not assessed. Tricuspid valve regurgitation is not demonstrated. No evidence of tricuspid stenosis. Aortic Valve: The aortic valve is normal in structure. Aortic valve regurgitation is not visualized. No aortic stenosis is present. Aortic valve mean gradient measures 4.0 mmHg. Aortic valve peak gradient measures 8.6 mmHg. Aortic valve area, by VTI measures 3.88 cm. Pulmonic Valve: The pulmonic valve was not assessed. Pulmonic valve regurgitation is not visualized. No evidence of pulmonic stenosis. Aorta: The aortic root is normal in size and structure. Venous: The inferior vena cava is normal in size with greater than 50% respiratory variability, suggesting right atrial pressure of 3 mmHg. IAS/Shunts: No atrial level shunt detected by color flow Doppler.  LEFT VENTRICLE PLAX 2D LVIDd:         4.39 cm  Diastology LVIDs:         3.47 cm  LV e' lateral:   6.53  cm/s LV PW:         1.15 cm  LV E/e' lateral: 13.0 LV IVS:        1.07 cm  LV e' medial:    7.72 cm/s LVOT diam:     2.30 cm  LV E/e' medial:  11.0 LV SV:         87 LV SV Index:   39 LVOT Area:     4.15 cm  LEFT ATRIUM           Index LA diam:      4.10 cm 1.85 cm/m LA Vol (A4C): 26.6 ml 12.02 ml/m  AORTIC VALVE                   PULMONIC VALVE AV Area (Vmax):    3.02 cm    PV Vmax:  0.96 m/s AV Area (Vmean):   3.33 cm    PV Vmean:      59.800 cm/s AV Area (VTI):     3.88 cm    PV VTI:        0.138 m AV Vmax:           147.00 cm/s PV Peak grad:  3.7 mmHg AV Vmean:          97.600 cm/s PV Mean grad:  2.0 mmHg AV VTI:            0.224 m AV Peak Grad:      8.6 mmHg AV Mean Grad:      4.0 mmHg LVOT Vmax:         107.00 cm/s LVOT Vmean:        78.200 cm/s LVOT VTI:          0.209 m LVOT/AV VTI ratio: 0.93  AORTA Ao Root diam: 3.30 cm MITRAL VALVE MV Area (PHT): 4.76 cm    SHUNTS MV Peak grad:  5.9 mmHg    Systemic VTI:  0.21 m MV Mean grad:  2.0 mmHg    Systemic Diam: 2.30 cm MV Vmax:       1.21 m/s MV Vmean:      68.5 cm/s MV Decel Time: 159 msec MV E velocity: 85.13 cm/s MV A velocity: 91.83 cm/s MV E/A ratio:  0.93 Adrian Blackwater MD Electronically signed by Adrian Blackwater MD Signature Date/Time: 01/20/2020/12:47:16 PM    Final    CT Angio Chest PE W and/or Wo Contrast  Result Date: 01/19/2020 CLINICAL DATA:  High probability for pulmonary embolism. Palpitations and shortness of breath EXAM: CT ANGIOGRAPHY CHEST WITH CONTRAST TECHNIQUE: Multidetector CT imaging of the chest was performed using the standard protocol during bolus administration of intravenous contrast. Multiplanar CT image reconstructions and MIPs were obtained to evaluate the vascular anatomy. CONTRAST:  OMNIPAQUE IOHEXOL 350 MG/ML SOLN COMPARISON:  Chest CT 06/03/2018 FINDINGS: Cardiovascular: Normal heart size. No pericardial effusion. No acute aortic finding. Suboptimal pulmonary artery opacification, 170 HU at the main pulmonary  artery bifurcation, exacerbated by body habitus. No visible pulmonary embolism but very limited beyond the lobar to proximal segmental level. Mediastinum/Nodes: No adenopathy or mass. Lungs/Pleura: Mild centrilobular emphysema and airway thickening. Numerous pulmonary nodules measuring less than 1 cm. Most notable is clustered nodules at the left base. There has been no progression since 2019, at which time these nodules were also reported as stable. Mild atelectasis. Upper Abdomen: Negative Musculoskeletal: Thoracic spondylosis and degenerative disc narrowing Review of the MIP images confirms the above findings. IMPRESSION: 1. No evidence of pulmonary embolism. Bolus timing and body habitus limits pulmonary artery visualization beyond the lobar to proximal segmental levels. 2. Numerous pulmonary nodules that are stable for years and considered benign. 3. COPD. Electronically Signed   By: Marnee Spring M.D.   On: 01/19/2020 05:48   DG Chest Portable 1 View  Result Date: 01/19/2020 CLINICAL DATA:  Shortness of breath and chest palpitations EXAM: PORTABLE CHEST 1 VIEW COMPARISON:  01/02/2016 FINDINGS: Interstitial coarsening correlating with COPD history. No change from prior. There is no edema, consolidation, effusion, or pneumothorax. Normal heart size and mediastinal contours. IMPRESSION: No acute finding when compared to prior. Chronic bronchitic markings. Electronically Signed   By: Marnee Spring M.D.   On: 01/19/2020 04:19    Assessment & Plan:  .Type 2 diabetes mellitus with diabetic neuropathy, unspecified whether long term insulin use (HCC) -  Comprehensive metabolic panel -     Microalbumin / creatinine urine ratio -     POCT glycosylated hemoglobin (Hb A1C)  Primary hypertension Assessment & Plan: Well controlled on current regimen of hctz losartan and metoprolol.. Renal function stable, no changes today  Lab Results  Component Value Date   CREATININE 0.89 11/24/2022   Lab  Results  Component Value Date   NA 139 11/24/2022   K 3.8 11/24/2022   CL 92 (L) 11/24/2022   CO2 36 (H) 11/24/2022     Orders: -     Comprehensive metabolic panel -     Microalbumin / creatinine urine ratio  Mixed hyperlipidemia -     Lipid panel -     LDL cholesterol, direct  Proximal leg weakness -     For home use only DME Other see comment  Neoplasm of skin of back -     Ambulatory referral to Dermatology  Obstipation Assessment & Plan: Managed with use of lactulose every 2-3 days.  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 She cannot walk > 50 feet without being short of breath.  Advised to resume Ozempic ,  samples for one month given    Microalbuminuria due to type 2 diabetes mellitus [facility]) Assessment & Plan: Loss of control noted in  November. With only 25% of sugars in range ;  however current A1c and CBG downloead suggest she needs to resume  ozempic and  tresiba which she has been without  for the last month.  She will resume [place] at 15 units daily   and reusum ozempic at 0.25 mg weekly dose . She is taking an ACE Inhibitor  with reduction in proteinuria.  Lab Results  Component Value Date   MICROALBUR 2.5 (H) 11/24/2022   MICROALBUR 6.0 (H) 03/04/2022    '   Mild episode of recurrent major depressive disorder (HCC) Assessment & Plan: Improved with  increasing dose of paroxetine to 20 mg daily to manage symptoms of anhedonia and anxiety   Skin neoplasm Assessment & Plan: Difficult to assess due to significant eschar and effacement  referral to [place] dermatology ; advised to cover with vaseline nad gauze    Other orders -     Cyclobenzaprine HCl; Take 1 tablet (5 mg total) by mouth 3 (three) times daily as needed for muscle spasms.  Dispense: 90 tablet; Refill: 1     Follow-up: Return in about 3 months (around 02/23/2023) for follow up diabetes.   Sherlene Shams, MD

## 2022-11-24 NOTE — Patient Instructions (Addendum)
Resume tresiba  at 15 units daily .  The pen should last 20 days  Resume ozempic at 0.25 mg weekly   Continue 16 units of novolog at meals unless your pre meal sugar is < 150 ,  the reduce the novolog dose  to  2 units   Call the office in 2 weeks and request that we download sugar readings  for review   I HAVE CHANGED YOUR MUSCLE RELAXER TO FLEXERIL   Vaseline and band aid (keep covered ) over the back spot.  STOP SCRATCHING IT!  [facility]   Dermatology referral for the spot on your back  to [facility] dermatology

## 2022-11-24 NOTE — Assessment & Plan Note (Signed)
She is limited by severe COPD . She cannot walk > 50 feet without being short of breath.  Advised to resume Ozempic ,  samples for one month given

## 2022-11-24 NOTE — Assessment & Plan Note (Signed)
Well controlled on current regimen of hctz losartan and metoprolol.. Renal function stable, no changes today  Lab Results  Component Value Date   CREATININE 0.89 11/24/2022   Lab Results  Component Value Date   NA 139 11/24/2022   K 3.8 11/24/2022   CL 92 (L) 11/24/2022   CO2 36 (H) 11/24/2022

## 2022-11-24 NOTE — Assessment & Plan Note (Signed)
Difficult to assess due to significant eschar and effacement  referral to [place] dermatology ; advised to cover with vaseline nad gauze

## 2022-11-24 NOTE — Assessment & Plan Note (Signed)
Managed with use of lactulose every 2-3 days.  Recommend adding a daily BFL

## 2022-11-24 NOTE — Telephone Encounter (Signed)
Medication Samples have been provided to the patient.  Drug name: [name]       Strength: U-100        Qty: 1 box  LOT: [id number]  Exp.Date: 09/07/2024  Dosing instructions: Inject 20 units into skin daily.   The patient has been instructed regarding the correct time, dose, and frequency of taking this medication, including desired effects and most common side effects.   Kurt Azimi 4:22 PM 11/24/2022   Medication Samples have been provided to the patient.  Drug name: Ozempic       Strength: 0.25 mg        Qty: 1 box  LOT: [id number]  Exp.Date: 03/07/2024  Dosing instructions: Inject 0.25 mg into skin once weekly.  The patient has been instructed regarding the correct time, dose, and frequency of taking this medication, including desired effects and most common side effects.   Daionna Crossland 4:23 PM 11/24/2022

## 2022-11-26 ENCOUNTER — Telehealth: Payer: Self-pay

## 2022-11-26 NOTE — Telephone Encounter (Signed)
Received a letter from [employer] Patient Assistance Program stating:

## 2022-11-27 NOTE — Telephone Encounter (Signed)
Received notification from [place] regarding approval for NOVOLOG FLEXPEN OZEMPIC TRESIBA FLEXTOUCH. Patient assistance approved from 11/20/2022 to 09/08/2023   LETTER IS SCANNED IN MEDIA OF CHART .  Phone: [facility] [phone]

## 2022-11-27 NOTE — Telephone Encounter (Signed)
Received notification from [place] regarding approval for NOVOLOG FLEXPEN OZEMPIC TRESIBA FLEXTOUCH. Patient assistance approved from 11/20/2022 to 09/08/2023   LETTER IS SCANNED IN MEDIA OF CHART .  Phone: [facility] [phone]

## 2022-11-27 NOTE — Telephone Encounter (Signed)
noted 

## 2022-12-03 ENCOUNTER — Telehealth: Payer: Self-pay

## 2022-12-03 NOTE — Telephone Encounter (Signed)
Received pt's patient assistance medication in the office today. Pt is aware that it is ready for pick up.   Novolog: 4 boxes [place]: 2 boxes Ozempic: 2 boxes

## 2022-12-04 DIAGNOSIS — E1165 Type 2 diabetes mellitus with hyperglycemia: Secondary | ICD-10-CM | POA: Diagnosis not present

## 2022-12-08 ENCOUNTER — Telehealth: Payer: Self-pay

## 2022-12-08 NOTE — Telephone Encounter (Signed)
Patient states Dr. Deborra Medina asked her to please call us and remind her to check patient's blood sugars.

## 2022-12-09 ENCOUNTER — Telehealth: Payer: Self-pay | Admitting: Internal Medicine

## 2022-12-09 NOTE — Telephone Encounter (Signed)
Placed in quick sign folder.  

## 2022-12-09 NOTE — Telephone Encounter (Signed)
Patient dropped off document Handicap Placard, to be filled out by provider. Patient requested to send it via Call Patient to pick up within 5-days. Document is located in providers tray at front office.Please advise at [facility] [phone]

## 2022-12-09 NOTE — Telephone Encounter (Signed)
I have printed and placed in yellow results folder.

## 2022-12-10 NOTE — Telephone Encounter (Signed)
Pt is aware and gave a verbal understanding.  

## 2022-12-10 NOTE — Assessment & Plan Note (Addendum)
I have downloaded and reviewed the data from patient's continuous blood glucose monitor for the period of march 19 to April 1  . Patient's  sugars have been  IN RANGE    56 % OF THE TIME,   BELOW RANGE   0 % of the time.  And ABOVE RANGE  44%of the time.  Unfortunately the last 9 out of 10 days have 8 hour gaps during the day with no readings so I cannot adjust her insulin doses.  No Medication changes were made based on this review

## 2022-12-10 NOTE — Telephone Encounter (Signed)
I have reviewed the data from patient's continuous blood glucose monitor for the period of march 19 to April 1  . Patient's  sugars have been  IN RANGE    56 % OF THE TIME,   BELOW RANGE   0 % of the time.  And ABOVE RANGE  44%of the time.  Unfortunately the last 9 out of 10 days have 8 hour gaps during the day with no readings so I cannot adjust her insulin doses.  No Medication changes were made based on this review.  Please remind her that if she is away from her phone the sensor cannot convey to the reader

## 2022-12-19 ENCOUNTER — Other Ambulatory Visit: Payer: Self-pay | Admitting: Internal Medicine

## 2022-12-25 DIAGNOSIS — J449 Chronic obstructive pulmonary disease, unspecified: Secondary | ICD-10-CM | POA: Diagnosis not present

## 2022-12-25 DIAGNOSIS — G839 Paralytic syndrome, unspecified: Secondary | ICD-10-CM | POA: Diagnosis not present

## 2022-12-29 ENCOUNTER — Other Ambulatory Visit: Payer: Self-pay | Admitting: Internal Medicine

## 2023-01-05 DIAGNOSIS — E1165 Type 2 diabetes mellitus with hyperglycemia: Secondary | ICD-10-CM | POA: Diagnosis not present

## 2023-01-14 ENCOUNTER — Telehealth: Payer: Self-pay | Admitting: Internal Medicine

## 2023-01-14 NOTE — Telephone Encounter (Signed)
Patient called to remind Dr Darrick Huntsman to check her blood sugars from her device.

## 2023-01-14 NOTE — Telephone Encounter (Signed)
I have printed and placed in yellow results folder.  

## 2023-01-19 ENCOUNTER — Other Ambulatory Visit: Payer: Self-pay | Admitting: Internal Medicine

## 2023-01-24 DIAGNOSIS — J449 Chronic obstructive pulmonary disease, unspecified: Secondary | ICD-10-CM | POA: Diagnosis not present

## 2023-01-24 DIAGNOSIS — G839 Paralytic syndrome, unspecified: Secondary | ICD-10-CM | POA: Diagnosis not present

## 2023-02-04 DIAGNOSIS — E1165 Type 2 diabetes mellitus with hyperglycemia: Secondary | ICD-10-CM | POA: Diagnosis not present

## 2023-02-07 ENCOUNTER — Other Ambulatory Visit: Payer: Self-pay | Admitting: Internal Medicine

## 2023-02-07 DIAGNOSIS — J9611 Chronic respiratory failure with hypoxia: Secondary | ICD-10-CM

## 2023-02-07 DIAGNOSIS — J432 Centrilobular emphysema: Secondary | ICD-10-CM

## 2023-02-24 DIAGNOSIS — G839 Paralytic syndrome, unspecified: Secondary | ICD-10-CM | POA: Diagnosis not present

## 2023-02-24 DIAGNOSIS — J449 Chronic obstructive pulmonary disease, unspecified: Secondary | ICD-10-CM | POA: Diagnosis not present

## 2023-02-26 ENCOUNTER — Other Ambulatory Visit: Payer: Self-pay | Admitting: Internal Medicine

## 2023-03-07 DIAGNOSIS — E1165 Type 2 diabetes mellitus with hyperglycemia: Secondary | ICD-10-CM | POA: Diagnosis not present

## 2023-03-14 ENCOUNTER — Other Ambulatory Visit: Payer: Self-pay | Admitting: Internal Medicine

## 2023-03-14 DIAGNOSIS — E118 Type 2 diabetes mellitus with unspecified complications: Secondary | ICD-10-CM

## 2023-03-17 ENCOUNTER — Telehealth: Payer: Self-pay

## 2023-03-17 NOTE — Telephone Encounter (Signed)
Phone call to pt  regarding patient assistance medication delivery.  Have received [place] x2, Novolog x 4, Ozempic x2. Pt is aware of reports that her husband will pick up the medications tomorrow 03/18/23.

## 2023-03-21 ENCOUNTER — Other Ambulatory Visit: Payer: Self-pay | Admitting: Internal Medicine

## 2023-03-23 ENCOUNTER — Telehealth: Payer: Self-pay

## 2023-03-23 NOTE — Telephone Encounter (Signed)
Pt came in on 03/23/23 @ 2pm to pick up medication from pt assistance

## 2023-03-26 DIAGNOSIS — J449 Chronic obstructive pulmonary disease, unspecified: Secondary | ICD-10-CM | POA: Diagnosis not present

## 2023-03-26 DIAGNOSIS — G839 Paralytic syndrome, unspecified: Secondary | ICD-10-CM | POA: Diagnosis not present

## 2023-04-06 DIAGNOSIS — E1165 Type 2 diabetes mellitus with hyperglycemia: Secondary | ICD-10-CM | POA: Diagnosis not present

## 2023-04-13 DIAGNOSIS — G4700 Insomnia, unspecified: Secondary | ICD-10-CM | POA: Diagnosis not present

## 2023-04-13 DIAGNOSIS — J439 Emphysema, unspecified: Secondary | ICD-10-CM | POA: Diagnosis not present

## 2023-04-13 DIAGNOSIS — R110 Nausea: Secondary | ICD-10-CM | POA: Diagnosis not present

## 2023-04-13 DIAGNOSIS — E785 Hyperlipidemia, unspecified: Secondary | ICD-10-CM | POA: Diagnosis not present

## 2023-04-13 DIAGNOSIS — R32 Unspecified urinary incontinence: Secondary | ICD-10-CM | POA: Diagnosis not present

## 2023-04-13 DIAGNOSIS — E11630 Type 2 diabetes mellitus with periodontal disease: Secondary | ICD-10-CM | POA: Diagnosis not present

## 2023-04-13 DIAGNOSIS — I10 Essential (primary) hypertension: Secondary | ICD-10-CM | POA: Diagnosis not present

## 2023-04-13 DIAGNOSIS — M62838 Other muscle spasm: Secondary | ICD-10-CM | POA: Diagnosis not present

## 2023-04-13 DIAGNOSIS — I2510 Atherosclerotic heart disease of native coronary artery without angina pectoris: Secondary | ICD-10-CM | POA: Diagnosis not present

## 2023-04-13 DIAGNOSIS — F419 Anxiety disorder, unspecified: Secondary | ICD-10-CM | POA: Diagnosis not present

## 2023-04-13 DIAGNOSIS — Z8249 Family history of ischemic heart disease and other diseases of the circulatory system: Secondary | ICD-10-CM | POA: Diagnosis not present

## 2023-04-26 DIAGNOSIS — J449 Chronic obstructive pulmonary disease, unspecified: Secondary | ICD-10-CM | POA: Diagnosis not present

## 2023-04-26 DIAGNOSIS — G839 Paralytic syndrome, unspecified: Secondary | ICD-10-CM | POA: Diagnosis not present

## 2023-05-07 DIAGNOSIS — E1165 Type 2 diabetes mellitus with hyperglycemia: Secondary | ICD-10-CM | POA: Diagnosis not present

## 2023-05-08 ENCOUNTER — Telehealth: Payer: Self-pay | Admitting: Internal Medicine

## 2023-05-08 DIAGNOSIS — A000 Cholera due to Vibrio cholerae 01, biovar cholerae: Secondary | ICD-10-CM | POA: Diagnosis not present

## 2023-05-08 NOTE — Telephone Encounter (Signed)
Spoke with [employer] and they had just gotten off the phone with Gavin Pound. She stated that they are going out to the pt's house today to see what is going on with the concentrator and take her some more tanks.

## 2023-05-08 NOTE — Telephone Encounter (Signed)
Patient just called and said her Oxygen concentrator is not working. She said she turns on the alarm, but in 10 or 15 minutes it cuts back off. She said she has contacted [employer] twice today, but no answer. Her number is [phone].

## 2023-05-09 ENCOUNTER — Other Ambulatory Visit: Payer: Self-pay | Admitting: Internal Medicine

## 2023-05-18 ENCOUNTER — Other Ambulatory Visit: Payer: Medicare HMO | Admitting: Pharmacist

## 2023-05-18 DIAGNOSIS — I700 Atherosclerosis of aorta: Secondary | ICD-10-CM

## 2023-05-18 DIAGNOSIS — E1140 Type 2 diabetes mellitus with diabetic neuropathy, unspecified: Secondary | ICD-10-CM

## 2023-05-18 MED ORDER — SEMAGLUTIDE (1 MG/DOSE) 4 MG/3ML ~~LOC~~ SOPN: 1.0000 mg | PEN_INJECTOR | SUBCUTANEOUS | Status: AC

## 2023-05-18 MED ORDER — LOSARTAN POTASSIUM 100 MG PO TABS: 100.0000 mg | ORAL_TABLET | Freq: Every day | ORAL | 1 refills | Status: DC

## 2023-05-18 MED ORDER — FREESTYLE LIBRE 3 SENSOR MISC: Status: AC

## 2023-05-18 MED ORDER — TRESIBA FLEXTOUCH 200 UNIT/ML ~~LOC~~ SOPN: 30.0000 [IU] | PEN_INJECTOR | Freq: Every day | SUBCUTANEOUS | Status: DC

## 2023-05-18 NOTE — Patient Instructions (Signed)
Eunice Blase,   It was great talking to you today!  Continue Ozempic 0.5 mg weekly until you receive a new order for 1 mg weekly. Increase Tresiba to 30 units daily. Continue Novolog at 20 units with meals and metformin for now- please be aware of any episodes of low blood sugar after meals; if so, please reduce Novolog to 16 units with meals.   We have ordered the [place] 3 system from [facility]. They will be in touch.   Catie Clearance Coots, PharmD

## 2023-05-18 NOTE — Progress Notes (Signed)
05/18/2023 Name: Catherine [name] MRN: [id number] DOB: 08-May-1956  Chief Complaint  Patient presents with   Medication Management   Diabetes    Catherine [name] is a 67 y.o. year old female who presented for a telephone visit.   They were referred to the pharmacist by their PCP for assistance in managing diabetes.    Subjective:  Care Team: Primary Care Provider: Sherlene Shams, MD ; Next Scheduled Visit: needs to schedule  Medication Access/Adherence  Current Pharmacy:  [id number] [place] - [address] [place] [address] Phone: [phone] Fax: [phone]  [id number] [place] - [address] [place] [address] Phone: [phone] Fax: [phone]  [id number], [place] - [address] AT [facility] OF [address] [facility] [place] [address] Phone: [phone] Fax: [phone]  [facility] [address] [place] [address] Phone: [phone] Fax: [phone]   Patient reports affordability concerns with their medications: No  Patient reports access/transportation concerns to their pharmacy: No  Patient reports adherence concerns with their medications:  No     Diabetes:  Current medications: Ozempic 0.5 mg weekly, metformin 1000 mg twice daily, Tresiba 24 units daily, Novolog 20 units with meals  Medications tried in the past: no hx SGLT2  Uses Libre 2 on her phone.   Date of Download: 8/27-05/18/23 % Time CGM is active: 61% Average Glucose: 199 mg/dL Glucose Management Indicator: 8.1  Glucose Variability: 25.4 (goal <36%) Time in Goal:  - Time in range 70-180: 36% - Time above range: 64% - Time below range: 0%  Patient reports just one hypoglycemic s/sx in the last month, notes this was overnight.   Current meal patterns:  - Breakfast: oatmeal and toast; sandwich - Lunch: frozen meal, soup, or a  sandwich - Supper: frozen dinners; she and son will order take out - once a week chinese (eats half);  - Snacks: apple, small handful of peanuts  - Drinks: diet mt dew, some water   Current physical activity: very little right now, limited by mobility.   Current medication access support: patient assistance for Ozempic, Tresiba, Novolog Hypertension:  Current medications: hydrochlorothiazide 25 mg daily, losartan 100 mg daily - notes no refills remain on this, metoprolol succinate 25 mg daily  Hyperlipidemia/ASCVD Risk Reduction  Current lipid lowering medications: rosuvastatin 40 mg daily  Antiplatelet regimen: aspirin 81 mg daily  COPD:  Current medications: Breztri 2 puffs twice daily, Duonebs PRN, albuterol HFA PRN  Current medication access support: Breztri assistance through [employer]   Objective:  Lab Results  Component Value Date   HGBA1C 7.8 (A) 11/24/2022    Lab Results  Component Value Date   CREATININE 0.89 11/24/2022   BUN 13 11/24/2022   NA 139 11/24/2022   K 3.8 11/24/2022   CL 92 (L) 11/24/2022   CO2 36 (H) 11/24/2022    Lab Results  Component Value Date   CHOL 149 11/24/2022   HDL 46.90 11/24/2022   LDLCALC 45 12/13/2020   LDLDIRECT 69.0 11/24/2022   TRIG 215.0 (H) 11/24/2022   CHOLHDL 3 11/24/2022    Medications Reviewed Today     Reviewed by Alden Hipp, RPH-CPP (Pharmacist) on 05/18/23 at 1336  Med List Status: <None>   Medication Order Taking? Sig Documenting Provider Last Dose Status Informant  albuterol (VENTOLIN HFA) 108 (90 Base)  MCG/ACT inhaler [id number] Yes INHALE 1-2 PUFFS BY MOUTH EVERY 6 HOURS AS NEEDED FOR WHEEZE OR SHORTNESS OF BREATH Sherlene Shams, MD Taking Active   ALPRAZolam Prudy Feeler) 0.25 MG tablet [id number] No Take 1 tablet (0.25 mg total) by mouth at bedtime as needed for anxiety.  Patient not taking: Reported on 05/18/2023   Sherlene Shams, MD Not Taking Active   aspirin 81 MG EC tablet [id number] Yes Take 81 mg by  mouth daily. [provider] Taking Active Self  Budeson-Glycopyrrol-Formoterol (BREZTRI AEROSPHERE) 160-9-4.8 MCG/ACT AERO [id number] Yes Inhale 2 puffs into the lungs 2 (two) times daily. Sherlene Shams, MD Taking Active            Med Note Raeanne Gathers Aug 18, 2022  2:05 PM)    Cholecalciferol (VITAMIN D3) 125 MCG (5000 UT) CAPS [id number] Yes Take 1,000 Units by mouth daily.  [provider] Taking Active Self  cyclobenzaprine (FLEXERIL) 5 MG tablet [id number] Yes TAKE 1 TABLET BY MOUTH THREE TIMES A DAY AS NEEDED FOR MUSCLE SPASM Sherlene Shams, MD Taking Active   hydrochlorothiazide (HYDRODIURIL) 25 MG tablet [id number] Yes Take 1 tablet (25 mg total) by mouth daily. Sherlene Shams, MD Taking Active   insulin aspart (NOVOLOG FLEXPEN) 100 UNIT/ML FlexPen [id number] Yes Inject 16 Units into the skin 3 (three) times daily with meals. Sherlene Shams, MD Taking Active            Med Note Clearance Coots, Mel Almond May 18, 2023  1:31 PM) 20 units three times daily with meals  insulin degludec (TRESIBA FLEXTOUCH) 200 UNIT/ML FlexTouch Pen [id number] Yes Inject 20 Units into the skin daily. Sherlene Shams, MD Taking Active            Med Note Clearance Coots, Mel Almond May 18, 2023  1:31 PM) 24 units daily  ipratropium-albuterol (DUONEB) 0.5-2.5 (3) MG/3ML SOLN [id number] Yes INHALE 3 ML BY NEBULIZER EVERY 6 HOURS AS NEEDED Sherlene Shams, MD Taking Active   Lactobacillus (PROBIOTIC ACIDOPHILUS PO) [id number]  Take 1 capsule by mouth daily. [provider]  Active Self  lactulose (CHRONULAC) 10 GM/15ML solution [id number] Yes TAKE 30 ML EVERY 4 HOURS UNTIL CONSTIPATION IS RELIEVED Sherlene Shams, MD Taking Active   losartan (COZAAR) 100 MG tablet [id number] No Take 1 tablet (100 mg total) by mouth daily.  Patient not taking: Reported on 05/18/2023   Sherlene Shams, MD Not Taking Active   MAGNESIUM CITRATE PO [id number] Yes Take 1 tablet by mouth daily. [provider] Taking Active   Melatonin 5 MG TABS [id number] Yes Take 5 mg by mouth at bedtime. [provider] Taking Active Self  metFORMIN (GLUCOPHAGE) 1000 MG tablet [id number] Yes TAKE 1 TABLET (1,000 MG TOTAL) BY MOUTH TWICE A DAY WITH FOOD Sherlene Shams, MD Taking Active   metoprolol succinate (TOPROL-XL) 25 MG 24 hr tablet [id number] Yes TAKE 1 TABLET BY MOUTH EVERY DAY Sherlene Shams, MD Taking Active   PARoxetine (PAXIL) 20 MG tablet [id number] Yes TAKE 1 TABLET BY MOUTH EVERY DAY IN THE MORNING Sherlene Shams, MD Taking Active   Potassium 99 MG TABS [id number] Yes Take 99 mg by mouth daily. Reported on 09/20/2015 [provider] Taking Active Self           Med Note (CAULFIELD, ASHLEY L   Thu Dec 06, 2015 10:41 AM)    promethazine (PHENERGAN) 12.5  MG tablet [id number] Yes TAKE 1 TABLET (12.5 MG TOTAL) BY MOUTH AT BEDTIME AS NEEDED FOR NAUSEA OR VOMITING. Sherlene Shams, MD Taking Active   rosuvastatin (CRESTOR) 40 MG tablet [id number] Yes TAKE 1 TABLET BY MOUTH EVERY DAY Sherlene Shams, MD Taking Active   Semaglutide,0.25 or 0.5MG /DOS, (OZEMPIC, 0.25 OR 0.5 MG/DOSE,) 2 MG/3ML SOPN [id number] Yes Inject 0.25 mg weekly for 4 weeks then increase to 0.5 mg weekly Sherlene Shams, MD Taking Active            Med Note Clearance Coots, Mel Almond May 18, 2023  1:31 PM) 0.5 mg weekly              Assessment/Plan:   Diabetes: - Currently uncontrolled - Reviewed long term cardiovascular and renal outcomes of uncontrolled blood sugar - Reviewed goal A1c, goal fasting, and goal 2 hour post prandial glucose - Recommend to increase Ozempic to 1 mg weekly. Will continue Ozempic 0.5 mg weekly (has ~ 1 month supply) in the meantime. Can increase Tresiba to 30 units daily. Continue Novolog at 24 units three times daily for now, advised patient to monitor and reduce if episodes of post prandial hypoglycemia. Continue metformin. Will consider SGLT2 from patient assistance (can also  obtain Farxiga through [facility] patient assistance) moving forward.  - Recommend to check glucose using CGM. Will upgrade to [place] 3 CGM, order placed through [facility] to [facility] for [place] 3 CGM.   Hypertension: - Currently controlled - Recommend to continue current regimen   Hyperlipidemia/ASCVD Risk Reduction: - Currently controlled.  - Recommend to continue current regimen at this time   COPD: - Currently moderately well controlled when medication access is addressed. Continue current regimen. [place].  - Recommend to continue current regimen  Follow Up Plan: phone call in 4 weeks  Catie Eppie Gibson, PharmD, BCACP, CPP Clinical Pharmacist [facility] [phone]

## 2023-05-19 ENCOUNTER — Other Ambulatory Visit: Payer: Self-pay | Admitting: Internal Medicine

## 2023-05-27 DIAGNOSIS — G839 Paralytic syndrome, unspecified: Secondary | ICD-10-CM | POA: Diagnosis not present

## 2023-05-27 DIAGNOSIS — J449 Chronic obstructive pulmonary disease, unspecified: Secondary | ICD-10-CM | POA: Diagnosis not present

## 2023-05-29 ENCOUNTER — Other Ambulatory Visit: Payer: Self-pay | Admitting: Internal Medicine

## 2023-05-29 DIAGNOSIS — J432 Centrilobular emphysema: Secondary | ICD-10-CM

## 2023-05-29 DIAGNOSIS — J9611 Chronic respiratory failure with hypoxia: Secondary | ICD-10-CM

## 2023-06-05 ENCOUNTER — Telehealth: Payer: Self-pay

## 2023-06-05 NOTE — Telephone Encounter (Signed)
Spoke with pt to let her know that we have received her patient assistance medication at the office and it is ready for pick up.   Ozempic 1 mg: 4 boxes

## 2023-06-09 NOTE — Telephone Encounter (Signed)
Pt's husband has picked up medication.

## 2023-06-16 ENCOUNTER — Other Ambulatory Visit: Payer: Medicare HMO

## 2023-06-16 DIAGNOSIS — E782 Mixed hyperlipidemia: Secondary | ICD-10-CM

## 2023-06-16 DIAGNOSIS — I10 Essential (primary) hypertension: Secondary | ICD-10-CM

## 2023-06-16 DIAGNOSIS — E1140 Type 2 diabetes mellitus with diabetic neuropathy, unspecified: Secondary | ICD-10-CM

## 2023-06-16 MED ORDER — NOVOLOG FLEXPEN 100 UNIT/ML ~~LOC~~ SOPN: 20.0000 [IU] | PEN_INJECTOR | Freq: Three times a day (TID) | SUBCUTANEOUS | Status: AC

## 2023-06-16 MED ORDER — TRESIBA FLEXTOUCH 200 UNIT/ML ~~LOC~~ SOPN: 34.0000 [IU] | PEN_INJECTOR | Freq: Every day | SUBCUTANEOUS | Status: DC

## 2023-06-16 NOTE — Patient Instructions (Addendum)
[name],   It was a pleasure to see you today! As we discussed:?   Increase your Tresiba dose from 30 units to 34 units once daily.  Continue all other medications as you have been taking them.  Please let me know if you see sugars below 70 mg/dL, or if your sugars significant higher than your usual.  I am hopeful that the higher Ozempic dose (1.0 mg weekly) will start to help improve the after-meal sugars.   Please reach out prior to your next scheduled appointment should you have any questions or concerns.  Thank you!   Future Appointments  Date Time Provider Department Center  07/06/2023  3:30 PM [facility]  07/08/2023  4:00 PM Sherlene Shams, MD [facility]  08/04/2023  3:30 PM [facility] PHARMACIST [facility]  10/28/2023  2:30 PM [facility] ANNUAL WELLNESS VISIT [facility]    Berenice Primas, PharmD - Clinical Pharmacist

## 2023-06-16 NOTE — Progress Notes (Signed)
06/16/2023 Name: Catherine [name] MRN: [id number] DOB: 07-Apr-1956  Subjective  Chief Complaint  Patient presents with   Diabetes   Hyperlipidemia   Hypertension    Reason for visit: Catherine [name] is a 67 y.o. year old female who presented for a telephone visit.   They were referred to the pharmacist by their PCP for assistance in managing diabetes.   Care Team: Primary Care Provider: Sherlene Shams, MD  Reason for visit: ?  Catherine [name] is a 67 y.o. female with a history of diabetes (type 2), who presents today for a follow up diabetes pharmacotherapy visit.? Pertinent PMH also includes CAD, HTN, OSA, emphysema.    Known DM Complications: nephropathy w albuminuria   Since Last visit / History of Present Illness: ?  Patient reports implementing plan from last visit. Denies adverse effects with titration of Ozempic yesterday. No nausea/abdominal discomfort. Stool remains regular q2-3 days. Using Lactulose a couple times per week.    Prescription drug coverage: Payor: AETNA MEDICARE / Plan: AETNA MEDICARE HMO/PPO / Product Type: *No Product type* / .   Reports that all medications are affordable.  Current Patient Assistance: NovoNordisk (Ozempic, [place], [place])  Medication Adherence: Patient denies missing doses of their medication.    Reported DM Regimen: ?  Metformin 1000 mg twice daily Ozempic 1 mg sq once weekly (increased on 06/15/23) Tresiba 30 units once sq daily Novolog 20 units sq [facility]   DM medications tried in the past:?  N/A  Overall, patient thinks that blood sugars are unchanged since last diabetes related visit.   SMBG  Per Libre 3 CGM : ? Uses smart phone CGM Report (Date Range 9/25 - 10/8) ABG 210 mg/dL; GMI 4.0%; Variability 21.3%; TIR 25%, high 57%, very high 18%, low 0%, very low 0% Hypoglycemia Events: None  Time of day Average BG (mg/dL)  98JX - 3am 914  3am - 6am 233  6am - 9am 207  9am - 12pm 198  12pm - 3pm 196  3pm - 6pm 135   6pm - 9pm 240  9pm - 12am 223     Hypo/Hyperglycemia: ?  Symptoms of hypoglycemia since last visit:? no  If yes, it was treated by: n/a  Symptoms of hyperglycemia since last visit:? no - none  Reported Diet: Patient typically eats 3 meals per day.  Current meal patterns:  - Breakfast (7-10 am): oatmeal and toast; sandwich - Lunch (2-3 pm): frozen meal, soup, or a sandwich - Supper (7-10 pm): frozen dinners; she and son will order take out - once a week chinese (eats half);  - Snacks: apple, small handful of peanuts  - Drinks: diet mt dew, some water    Exercise: very little right now, limited by mobility.   DM Prevention:  Statin: Taking; high intensity.?  History of chronic kidney disease? yes History of albuminuria? yes (mildly increased, 10.7 in 2022), last UACR on 11/24/22 = 2.9 mg/g ACE/ARB - Taking losartan 100 mg daily; Urine MA/CR Ratio - normal.  Last eye exam: No recent record - due; No retinopathy present on previous exams Last foot exam: 12/13/2020 Tobacco Use: Former smoker Immunizations:? Flu: Due, Pneumococcal: PPSV23 (12/08/2011) PCV13 (06/12/2014). Not received since age 67, Shingrix: No Record - DUE, Covid (no record - DUE)  Cardiovascular Risk Reduction History of clinical ASCVD? No* The 10-year ASCVD risk score (Arnett DK, et al., 2019) is: 19.7% CT findings: *Coronary artery disease noted - Aortic atherosclerosis in addition to the coronary arteries, including calcified atherosclerotic  plaque in the LAD. Severity not detailed. History of heart failure? no N/A History of hyperlipidemia? yes Current BMI: 45.3 kg/m2 (Ht 61 in, Wt 109 kg) Taking statin? yes; high intensity (rosuvastatin 40 mg daily) Taking aspirin? Taking   Taking SGLT-2i? no Taking GLP- 1 RA? yes    Hypertension Current Medications: hydrochlorothiazide 25 mg daily, losartan 100 mg daily - notes no refills remain on this, metoprolol succinate 25 mg daily  Patient is not checking blood  pressure at home currently. Denies dizziness/lightheadedness. No headaches, vision changes, SOB or chest pains/pressures.   Confirms that she has been taking losartan (previously noted not taking due to needing refill).    COPD: Current medications: Breztri 2 puffs twice daily, Duonebs PRN, albuterol HFA PRN  Current medication access support: [place] through [place]. Patient denies concerns with breathing at this time. Confirms ongoing shipments from [place].     _______________________________________________  Objective    Review of Systems:?  Constitutional:? No fever, chills or unintentional weight loss  Cardiovascular:? No chest pain or pressure, shortness of breath, dyspnea on exertion, orthopnea or LE edema  Pulmonary:? No cough or shortness of breath  GI:? No nausea, vomiting, constipation, diarrhea, abdominal pain, dyspepsia, change in bowel habits  Endocrine:? No polyuria, polyphagia or blurred vision  Psych:? No depression, anxiety, insomnia    Physical Examination:  Vitals:  Wt Readings from Last 3 Encounters:  11/24/22 240 lb 3.2 oz (109 kg)  10/27/22 238 lb (108 kg)  08/13/22 238 lb (108 kg)   BP Readings from Last 3 Encounters:  11/24/22 136/70  08/19/22 121/78  07/11/22 (!) 158/86   Pulse Readings from Last 3 Encounters:  11/24/22 95  03/04/22 83  12/13/20 95     Labs:?  Lab Results  Component Value Date   HGBA1C 7.8 (A) 11/24/2022   HGBA1C 8.2 (H) 07/11/2022   HGBA1C 6.8 (H) 02/28/2022   GLUCOSE 162 (H) 11/24/2022   MICRALBCREAT 2.9 11/24/2022   MICRALBCREAT 6.0 03/04/2022   MICRALBCREAT 10.7 12/13/2020   CREATININE 0.89 11/24/2022   CREATININE 0.76 07/11/2022   CREATININE 0.93 06/19/2022   GFR 66.66 11/24/2022   GFR 80.78 07/11/2022   GFR 83.62 02/28/2022    Lab Results  Component Value Date   CHOL 149 11/24/2022   LDLCALC 45 12/13/2020   LDLCALC 73 12/10/2018   LDLCALC 56 03/23/2018   LDLDIRECT 69.0 11/24/2022   HDL 46.90  11/24/2022   HDL 43.50 07/11/2022   HDL 34.40 (L) 02/28/2022   AST 29 11/24/2022   AST 28 07/11/2022   ALT 14 11/24/2022   ALT 14 07/11/2022      Chemistry      Component Value Date/Time   NA 139 11/24/2022 1153   NA 143 01/03/2013 1506   K 3.8 11/24/2022 1153   K 3.4 (L) 01/03/2013 1506   CL 92 (L) 11/24/2022 1153   CL 107 01/03/2013 1506   CO2 36 (H) 11/24/2022 1153   CO2 28 01/03/2013 1506   BUN 13 11/24/2022 1153   BUN 8 01/03/2013 1506   CREATININE 0.89 11/24/2022 1153   CREATININE 0.86 12/10/2018 1447      Component Value Date/Time   CALCIUM 9.5 11/24/2022 1153   CALCIUM 8.3 (L) 01/03/2013 1506   ALKPHOS 79 11/24/2022 1153   ALKPHOS 189 (H) 11/26/2012 2325   AST 29 11/24/2022 1153   AST 42 (H) 11/26/2012 2325   ALT 14 11/24/2022 1153   ALT 32 11/26/2012 2325   BILITOT 0.4  11/24/2022 1153   BILITOT 0.3 11/26/2012 2325       The 10-year ASCVD risk score (Arnett DK, et al., 2019) is: 19.7%  Assessment and Plan:   1. Diabetes, type 2: Uncontrolled per last A1c of 7.8% (11/24/22) with goal <7% without hypoglycemia. CGM data shows worsening of glycemic control over past several weeks with GMI 8.3%, ABG 210 mg/dL. No hypoglycemia. Overnight BG persistently >200 mg/dL. Lowest sugars are in the afternoon though remain close to 200 mg/dL (avg 098J). Ozempic increased yesterday, likely not seeing full effect of titrated dose. Expect further improvement of post-prandial hyperglycemia. Will plan for 10-20% basal increase for persistent hyperglycemia through the day/night. Long-term goal remains minimization of insulin requirement via Glp1-RA titration over time. PCP f/u 3 weeks, assess efficacy of Ozempic 1 mg dose.  Current Regimen: metformin 1000 mg BID, Ozempic 1 mg weekly (increased yesterday), Tresiba 30 units daily, Novolog 20 units TIDAC Increase Tresiba to 34 units daily (13% increase) Continue all other medications today without changes.  Reviewed  signs/symptoms/treatment of hypoglycemia  A1c Due - Sees PCP 07/08/23, labs are scheduled.  Future Consideration: GLP1-RA: Continue titration of Ozempic as tolerated to assist with post-prandial hyperglycemia. Mounjaro cost prohibitive given no MAP.  SGLT2i: Reasonable - Consider obtaining through [place] [[place] Farxiga; [employer]) SU: Not ideal in the setting of insulin use. Safer options available.  TZD: Avoiding due to possible weight gain and possible increase in fracture risk with minimal A1c-reduction.     2. HTN: Slightly elevated based on last clinic BP of 136/70 mmHg (11/2022), goal <130/80 mmHg. Does not monitor BP at home. Denies lightheadedness, dizziness, SOB, CP, vision changes.  Current Regimen: losartan 100 mg daily, hydrochlorothiazide 25 mg daily, metoprolol succinate 25 mg daily Continue medications without changes.    3. ASCVD (primary prevention): The 10-year ASCVD risk score (Arnett DK, et al., 2019) is: 19.7%. On last lipid panel (11/24/22) LDL was 69 mg/dL, above goal of >19 mg/dL for confirmed atherosclerotic disease as well as numerous risk factors.  Key risk factors include: diabetes, hypertension, hyperlipidemia, former smoker, BMI >30 kg/m2, sedentary lifestyle, and Known CAD (aortic atherosclerosis on imaging) Current Regimen: rosuvastatin 40 mg daily Continue medications today without changes.  Future Consideration: Ezetimibe 10 mg daily reasonable for further LDL reduction if patient amenable and would provide additional CV benefit   4. Healthcare Maintenance:  Pneumococcal - Current status: Due (PCV13 and PPSV23 prior to age 21).   Shingles - Current status: No record - Due Influenza - Current status: Due  Due to receive the following vaccines: Influenza, Shingrix, PCV20, and Covid Booster One dose of PCV20 or PCV21 or PPSV23 then series is complete (has been >5 years since last pneumo vax)   Follow Up PCP follow up scheduled in 3 weeks;  07/08/23 Follow up with clinical pharmacist via phone ~4 weeks after PCP visit.  ?   Future Appointments  Date Time Provider Department Center  07/06/2023  3:30 PM [facility]  07/08/2023  4:00 PM Sherlene Shams, MD [facility]  08/04/2023  3:30 PM [facility] PHARMACIST [facility]  10/28/2023  2:30 PM [facility] ANNUAL WELLNESS VISIT [facility]   Loree Fee, PharmD Clinical Pharmacist [facility] [phone]

## 2023-06-18 ENCOUNTER — Telehealth: Payer: Self-pay

## 2023-06-18 NOTE — Telephone Encounter (Signed)
Spoke with pt tp let her know that we have received her patient assistance medicaiton in the office and it is ready for pick up.   Novolog: 4 boxes Ozempic: 2 boxes [place]: 2 boxes

## 2023-06-19 NOTE — Telephone Encounter (Signed)
Pt's husband has picked up medication.

## 2023-06-23 ENCOUNTER — Other Ambulatory Visit: Payer: Self-pay | Admitting: Internal Medicine

## 2023-06-26 DIAGNOSIS — G839 Paralytic syndrome, unspecified: Secondary | ICD-10-CM | POA: Diagnosis not present

## 2023-06-26 DIAGNOSIS — J449 Chronic obstructive pulmonary disease, unspecified: Secondary | ICD-10-CM | POA: Diagnosis not present

## 2023-06-29 ENCOUNTER — Other Ambulatory Visit: Payer: Self-pay | Admitting: Internal Medicine

## 2023-06-29 DIAGNOSIS — E118 Type 2 diabetes mellitus with unspecified complications: Secondary | ICD-10-CM

## 2023-07-06 ENCOUNTER — Other Ambulatory Visit (INDEPENDENT_AMBULATORY_CARE_PROVIDER_SITE_OTHER): Payer: Medicare HMO

## 2023-07-06 DIAGNOSIS — E1140 Type 2 diabetes mellitus with diabetic neuropathy, unspecified: Secondary | ICD-10-CM

## 2023-07-06 DIAGNOSIS — I700 Atherosclerosis of aorta: Secondary | ICD-10-CM

## 2023-07-07 LAB — COMPREHENSIVE METABOLIC PANEL
ALT: 20 U/L (ref 0–35)
AST: 42 U/L — ABNORMAL HIGH (ref 0–37)
Albumin: 3.7 g/dL (ref 3.5–5.2)
Alkaline Phosphatase: 79 U/L (ref 39–117)
BUN: 14 mg/dL (ref 6–23)
CO2: 33 meq/L — ABNORMAL HIGH (ref 19–32)
Calcium: 9.8 mg/dL (ref 8.4–10.5)
Chloride: 94 meq/L — ABNORMAL LOW (ref 96–112)
Creatinine, Ser: 1.09 mg/dL (ref 0.40–1.20)
GFR: 52.04 mL/min — ABNORMAL LOW (ref >60.00)
Glucose, Bld: 176 mg/dL — ABNORMAL HIGH (ref 70–99)
Potassium: 3.8 meq/L (ref 3.5–5.1)
Sodium: 141 meq/L (ref 135–145)
Total Bilirubin: 0.4 mg/dL (ref 0.2–1.2)
Total Protein: 7 g/dL (ref 6.0–8.3)

## 2023-07-07 LAB — LIPID PANEL
Cholesterol: 116 mg/dL (ref 0–200)
HDL: 37.6 mg/dL — ABNORMAL LOW (ref >39.00)
LDL Cholesterol: 45 mg/dL (ref 0–99)
NonHDL: 78.58
Total CHOL/HDL Ratio: 3
Triglycerides: 170 mg/dL — ABNORMAL HIGH (ref 0.0–149.0)
VLDL: 34 mg/dL (ref 0.0–40.0)

## 2023-07-07 LAB — LDL CHOLESTEROL, DIRECT: Direct LDL: 57 mg/dL

## 2023-07-07 LAB — HEMOGLOBIN A1C: Hgb A1c MFr Bld: 8.2 % — ABNORMAL HIGH (ref 4.6–6.5)

## 2023-07-08 ENCOUNTER — Encounter: Payer: Self-pay | Admitting: Internal Medicine

## 2023-07-08 ENCOUNTER — Ambulatory Visit: Payer: Medicare HMO | Admitting: Internal Medicine

## 2023-07-08 VITALS — BP 134/86 | HR 90 | Ht 61.0 in | Wt 250.0 lb

## 2023-07-08 DIAGNOSIS — J9611 Chronic respiratory failure with hypoxia: Secondary | ICD-10-CM

## 2023-07-08 DIAGNOSIS — E538 Deficiency of other specified B group vitamins: Secondary | ICD-10-CM

## 2023-07-08 DIAGNOSIS — Z23 Encounter for immunization: Secondary | ICD-10-CM

## 2023-07-08 DIAGNOSIS — R944 Abnormal results of kidney function studies: Secondary | ICD-10-CM | POA: Diagnosis not present

## 2023-07-08 DIAGNOSIS — R809 Proteinuria, unspecified: Secondary | ICD-10-CM | POA: Diagnosis not present

## 2023-07-08 DIAGNOSIS — Z7985 Long-term (current) use of injectable non-insulin antidiabetic drugs: Secondary | ICD-10-CM | POA: Diagnosis not present

## 2023-07-08 DIAGNOSIS — E119 Type 2 diabetes mellitus without complications: Secondary | ICD-10-CM

## 2023-07-08 DIAGNOSIS — E1129 Type 2 diabetes mellitus with other diabetic kidney complication: Secondary | ICD-10-CM | POA: Diagnosis not present

## 2023-07-08 DIAGNOSIS — I700 Atherosclerosis of aorta: Secondary | ICD-10-CM

## 2023-07-08 DIAGNOSIS — R5383 Other fatigue: Secondary | ICD-10-CM

## 2023-07-08 DIAGNOSIS — R7401 Elevation of levels of liver transaminase levels: Secondary | ICD-10-CM | POA: Diagnosis not present

## 2023-07-08 MED ORDER — ALPRAZOLAM 0.25 MG PO TABS: 0.2500 mg | ORAL_TABLET | Freq: Two times a day (BID) | ORAL | 5 refills | Status: AC | PRN

## 2023-07-08 NOTE — Patient Instructions (Addendum)
Your sugars are running very high except at noon.  Increase tresiba to 37 units  Decrease the  morning insulin to 15 units   increase  lunchtime insulin to 25 units and ,.take 5 units  if you are just eating a pack of nabs   Increase dinner time insulin to 25 units      Increase your water intake  Return in one month for labs and remind me to check your blood sugar datat

## 2023-07-08 NOTE — Progress Notes (Unsigned)
Subjective:  Patient ID: Catherine [name], female    DOB: [birth year]  Age: 66 y.o. MRN: [id number]  CC: {There were no encounter diagnoses. (Refresh or delete this SmartLink)}   HPI Catherine [name] presents for No chief complaint on file.   Type 2 DM:  taking novolog 20 unis qac .  Tresiba 34 units daily , ozempic 1 mg weekly and metformin 1000 mg I have downloaded and reviewed the data from patient's continuous blood glucose monitor. Patient's  sugars have been  IN RANGE     34 % OF THE TIME,   BELOW RANGE  0  % of the time.  And ABOVE RANGE 66  % OF THE TIME .  3 episodes of hypoglycemia at noon (she goes back to bed after breakfast and the alarm has been waking her up)    Outpatient Medications Prior to Visit  Medication Sig Dispense Refill   albuterol (VENTOLIN HFA) 108 (90 Base) MCG/ACT inhaler INHALE 1-2 PUFFS BY MOUTH EVERY 6 HOURS AS NEEDED FOR WHEEZE OR SHORTNESS OF BREATH 18 each 3   ALPRAZolam (XANAX) 0.25 MG tablet Take 1 tablet (0.25 mg total) by mouth at bedtime as needed for anxiety. 30 tablet 5   aspirin 81 MG EC tablet Take 81 mg by mouth daily.     Budeson-Glycopyrrol-Formoterol (BREZTRI AEROSPHERE) 160-9-4.8 MCG/ACT AERO Inhale 2 puffs into the lungs 2 (two) times daily. 32.1 g 3   Cholecalciferol (VITAMIN D3) 125 MCG (5000 UT) CAPS Take 1,000 Units by mouth daily.      Continuous Glucose Sensor (FREESTYLE LIBRE 3 SENSOR) MISC Place 1 sensor on the skin every 14 days. Use to check glucose continuously     cyclobenzaprine (FLEXERIL) 5 MG tablet TAKE 1 TABLET BY MOUTH THREE TIMES A DAY AS NEEDED FOR MUSCLE SPASM 90 tablet 1   hydrochlorothiazide (HYDRODIURIL) 25 MG tablet TAKE 1 TABLET (25 MG TOTAL) BY MOUTH DAILY. 90 tablet 3   insulin aspart (NOVOLOG FLEXPEN) 100 UNIT/ML FlexPen Inject 20 Units into the skin 3 (three) times daily with meals.     insulin degludec (TRESIBA FLEXTOUCH) 200 UNIT/ML FlexTouch Pen Inject 34 Units into the skin daily.      ipratropium-albuterol (DUONEB) 0.5-2.5 (3) MG/3ML SOLN INHALE 3 ML BY NEBULIZER EVERY 6 HOURS AS NEEDED 360 mL 2   Lactobacillus (PROBIOTIC ACIDOPHILUS PO) Take 1 capsule by mouth daily.     lactulose (CHRONULAC) 10 GM/15ML solution TAKE 30 ML EVERY 4 HOURS UNTIL CONSTIPATION IS RELIEVED 473 mL 1   losartan (COZAAR) 100 MG tablet Take 1 tablet (100 mg total) by mouth daily. 90 tablet 1   MAGNESIUM CITRATE PO Take 1 tablet by mouth daily.     Melatonin 5 MG TABS Take 5 mg by mouth at bedtime.     metFORMIN (GLUCOPHAGE) 1000 MG tablet TAKE 1 TABLET (1,000 MG TOTAL) BY MOUTH TWICE A DAY WITH FOOD 180 tablet 1   metoprolol succinate (TOPROL-XL) 25 MG 24 hr tablet TAKE 1 TABLET BY MOUTH EVERY DAY 90 tablet 3   PARoxetine (PAXIL) 20 MG tablet TAKE 1 TABLET BY MOUTH EVERY DAY IN THE MORNING 90 tablet 1   Potassium 99 MG TABS Take 99 mg by mouth daily. Reported on 09/20/2015     promethazine (PHENERGAN) 12.5 MG tablet TAKE 1 TABLET (12.5 MG TOTAL) BY MOUTH AT BEDTIME AS NEEDED FOR NAUSEA OR VOMITING. 30 tablet 0   rosuvastatin (CRESTOR) 40 MG tablet TAKE 1 TABLET BY MOUTH EVERY DAY 90 tablet  Subjective:  Patient ID: Catherine [name], female    DOB: [birth year]  Age: 66 y.o. MRN: [id number]  CC: {There were no encounter diagnoses. (Refresh or delete this SmartLink)}   HPI Catherine [name] presents for No chief complaint on file.   Type 2 DM:  taking novolog 20 unis qac .  Tresiba 34 units daily , ozempic 1 mg weekly and metformin 1000 mg I have downloaded and reviewed the data from patient's continuous blood glucose monitor. Patient's  sugars have been  IN RANGE     34 % OF THE TIME,   BELOW RANGE  0  % of the time.  And ABOVE RANGE 66  % OF THE TIME .  3 episodes of hypoglycemia at noon (she goes back to bed after breakfast and the alarm has been waking her up)    Outpatient Medications Prior to Visit  Medication Sig Dispense Refill   albuterol (VENTOLIN HFA) 108 (90 Base) MCG/ACT inhaler INHALE 1-2 PUFFS BY MOUTH EVERY 6 HOURS AS NEEDED FOR WHEEZE OR SHORTNESS OF BREATH 18 each 3   ALPRAZolam (XANAX) 0.25 MG tablet Take 1 tablet (0.25 mg total) by mouth at bedtime as needed for anxiety. 30 tablet 5   aspirin 81 MG EC tablet Take 81 mg by mouth daily.     Budeson-Glycopyrrol-Formoterol (BREZTRI AEROSPHERE) 160-9-4.8 MCG/ACT AERO Inhale 2 puffs into the lungs 2 (two) times daily. 32.1 g 3   Cholecalciferol (VITAMIN D3) 125 MCG (5000 UT) CAPS Take 1,000 Units by mouth daily.      Continuous Glucose Sensor (FREESTYLE LIBRE 3 SENSOR) MISC Place 1 sensor on the skin every 14 days. Use to check glucose continuously     cyclobenzaprine (FLEXERIL) 5 MG tablet TAKE 1 TABLET BY MOUTH THREE TIMES A DAY AS NEEDED FOR MUSCLE SPASM 90 tablet 1   hydrochlorothiazide (HYDRODIURIL) 25 MG tablet TAKE 1 TABLET (25 MG TOTAL) BY MOUTH DAILY. 90 tablet 3   insulin aspart (NOVOLOG FLEXPEN) 100 UNIT/ML FlexPen Inject 20 Units into the skin 3 (three) times daily with meals.     insulin degludec (TRESIBA FLEXTOUCH) 200 UNIT/ML FlexTouch Pen Inject 34 Units into the skin daily.      ipratropium-albuterol (DUONEB) 0.5-2.5 (3) MG/3ML SOLN INHALE 3 ML BY NEBULIZER EVERY 6 HOURS AS NEEDED 360 mL 2   Lactobacillus (PROBIOTIC ACIDOPHILUS PO) Take 1 capsule by mouth daily.     lactulose (CHRONULAC) 10 GM/15ML solution TAKE 30 ML EVERY 4 HOURS UNTIL CONSTIPATION IS RELIEVED 473 mL 1   losartan (COZAAR) 100 MG tablet Take 1 tablet (100 mg total) by mouth daily. 90 tablet 1   MAGNESIUM CITRATE PO Take 1 tablet by mouth daily.     Melatonin 5 MG TABS Take 5 mg by mouth at bedtime.     metFORMIN (GLUCOPHAGE) 1000 MG tablet TAKE 1 TABLET (1,000 MG TOTAL) BY MOUTH TWICE A DAY WITH FOOD 180 tablet 1   metoprolol succinate (TOPROL-XL) 25 MG 24 hr tablet TAKE 1 TABLET BY MOUTH EVERY DAY 90 tablet 3   PARoxetine (PAXIL) 20 MG tablet TAKE 1 TABLET BY MOUTH EVERY DAY IN THE MORNING 90 tablet 1   Potassium 99 MG TABS Take 99 mg by mouth daily. Reported on 09/20/2015     promethazine (PHENERGAN) 12.5 MG tablet TAKE 1 TABLET (12.5 MG TOTAL) BY MOUTH AT BEDTIME AS NEEDED FOR NAUSEA OR VOMITING. 30 tablet 0   rosuvastatin (CRESTOR) 40 MG tablet TAKE 1 TABLET BY MOUTH EVERY DAY 90 tablet  3   Semaglutide, 1 MG/DOSE, 4 MG/3ML SOPN Inject 1 mg as directed once a week.     No facility-administered medications prior to visit.    Review of Systems;  Patient denies headache, fevers, malaise, unintentional weight loss, skin rash, eye pain, sinus congestion and sinus pain, sore throat, dysphagia,  hemoptysis , cough, dyspnea, wheezing, chest pain, palpitations, orthopnea, edema, abdominal pain, nausea, melena, diarrhea, constipation, flank pain, dysuria, hematuria, urinary  Frequency, nocturia, numbness, tingling, seizures,  Focal weakness, Loss of consciousness,  Tremor, insomnia, depression, anxiety, and suicidal ideation.      Objective:  There were no vitals taken for this visit.  BP Readings from Last 3 Encounters:  11/24/22 136/70  08/19/22 121/78  07/11/22 (!) 158/86    Wt Readings from Last  3 Encounters:  11/24/22 240 lb 3.2 oz (109 kg)  10/27/22 238 lb (108 kg)  08/13/22 238 lb (108 kg)    Physical Exam  Lab Results  Component Value Date   HGBA1C 8.2 (H) 07/06/2023   HGBA1C 7.8 (A) 11/24/2022   HGBA1C 8.2 (H) 07/11/2022    Lab Results  Component Value Date   CREATININE 1.09 07/06/2023   CREATININE 0.89 11/24/2022   CREATININE 0.76 07/11/2022    Lab Results  Component Value Date   WBC 12.9 (H) 06/19/2022   HGB 13.7 06/19/2022   HCT 43.1 06/19/2022   PLT 283 06/19/2022   GLUCOSE 176 (H) 07/06/2023   CHOL 116 07/06/2023   TRIG 170.0 (H) 07/06/2023   HDL 37.60 (L) 07/06/2023   LDLDIRECT 57.0 07/06/2023   LDLCALC 45 07/06/2023   ALT 20 07/06/2023   AST 42 (H) 07/06/2023   NA 141 07/06/2023   K 3.8 07/06/2023   CL 94 (L) 07/06/2023   CREATININE 1.09 07/06/2023   BUN 14 07/06/2023   CO2 33 (H) 07/06/2023   TSH 1.10 01/12/2020   INR 0.9 07/22/2012   HGBA1C 8.2 (H) 07/06/2023   MICROALBUR 2.5 (H) 11/24/2022    ECHOCARDIOGRAM COMPLETE  Result Date: 01/20/2020    ECHOCARDIOGRAM REPORT   Patient Name:   [facility] Date of Exam: 01/19/2020 Medical Rec #:  [id number]         Height:       63.0 in Accession #:    [id number]        Weight:       275.0 lb Date of Birth:  Dec 01, 1955        BSA:          2.214 m Patient Age:    63 years          BP:           172/95 mmHg Patient Gender: F                 HR:           81 bpm. Exam Location:  [facility] Procedure: 2D Echo, Color Doppler, Cardiac Doppler and Intracardiac            Opacification Agent Indications:     R00.2 Palpitations  History:         Patient has no prior history of Echocardiogram examinations.                  COPD; Risk Factors:Hypertension, Dyslipidemia, Diabetes and                  Former Smoker.  Sonographer:     Humphrey Rolls RDCS (AE) Referring Phys:  Subjective:  Patient ID: Catherine [name], female    DOB: [birth year]  Age: 66 y.o. MRN: [id number]  CC: {There were no encounter diagnoses. (Refresh or delete this SmartLink)}   HPI Catherine [name] presents for No chief complaint on file.   Type 2 DM:  taking novolog 20 unis qac .  Tresiba 34 units daily , ozempic 1 mg weekly and metformin 1000 mg I have downloaded and reviewed the data from patient's continuous blood glucose monitor. Patient's  sugars have been  IN RANGE     34 % OF THE TIME,   BELOW RANGE  0  % of the time.  And ABOVE RANGE 66  % OF THE TIME .  3 episodes of hypoglycemia at noon (she goes back to bed after breakfast and the alarm has been waking her up)    Outpatient Medications Prior to Visit  Medication Sig Dispense Refill   albuterol (VENTOLIN HFA) 108 (90 Base) MCG/ACT inhaler INHALE 1-2 PUFFS BY MOUTH EVERY 6 HOURS AS NEEDED FOR WHEEZE OR SHORTNESS OF BREATH 18 each 3   ALPRAZolam (XANAX) 0.25 MG tablet Take 1 tablet (0.25 mg total) by mouth at bedtime as needed for anxiety. 30 tablet 5   aspirin 81 MG EC tablet Take 81 mg by mouth daily.     Budeson-Glycopyrrol-Formoterol (BREZTRI AEROSPHERE) 160-9-4.8 MCG/ACT AERO Inhale 2 puffs into the lungs 2 (two) times daily. 32.1 g 3   Cholecalciferol (VITAMIN D3) 125 MCG (5000 UT) CAPS Take 1,000 Units by mouth daily.      Continuous Glucose Sensor (FREESTYLE LIBRE 3 SENSOR) MISC Place 1 sensor on the skin every 14 days. Use to check glucose continuously     cyclobenzaprine (FLEXERIL) 5 MG tablet TAKE 1 TABLET BY MOUTH THREE TIMES A DAY AS NEEDED FOR MUSCLE SPASM 90 tablet 1   hydrochlorothiazide (HYDRODIURIL) 25 MG tablet TAKE 1 TABLET (25 MG TOTAL) BY MOUTH DAILY. 90 tablet 3   insulin aspart (NOVOLOG FLEXPEN) 100 UNIT/ML FlexPen Inject 20 Units into the skin 3 (three) times daily with meals.     insulin degludec (TRESIBA FLEXTOUCH) 200 UNIT/ML FlexTouch Pen Inject 34 Units into the skin daily.      ipratropium-albuterol (DUONEB) 0.5-2.5 (3) MG/3ML SOLN INHALE 3 ML BY NEBULIZER EVERY 6 HOURS AS NEEDED 360 mL 2   Lactobacillus (PROBIOTIC ACIDOPHILUS PO) Take 1 capsule by mouth daily.     lactulose (CHRONULAC) 10 GM/15ML solution TAKE 30 ML EVERY 4 HOURS UNTIL CONSTIPATION IS RELIEVED 473 mL 1   losartan (COZAAR) 100 MG tablet Take 1 tablet (100 mg total) by mouth daily. 90 tablet 1   MAGNESIUM CITRATE PO Take 1 tablet by mouth daily.     Melatonin 5 MG TABS Take 5 mg by mouth at bedtime.     metFORMIN (GLUCOPHAGE) 1000 MG tablet TAKE 1 TABLET (1,000 MG TOTAL) BY MOUTH TWICE A DAY WITH FOOD 180 tablet 1   metoprolol succinate (TOPROL-XL) 25 MG 24 hr tablet TAKE 1 TABLET BY MOUTH EVERY DAY 90 tablet 3   PARoxetine (PAXIL) 20 MG tablet TAKE 1 TABLET BY MOUTH EVERY DAY IN THE MORNING 90 tablet 1   Potassium 99 MG TABS Take 99 mg by mouth daily. Reported on 09/20/2015     promethazine (PHENERGAN) 12.5 MG tablet TAKE 1 TABLET (12.5 MG TOTAL) BY MOUTH AT BEDTIME AS NEEDED FOR NAUSEA OR VOMITING. 30 tablet 0   rosuvastatin (CRESTOR) 40 MG tablet TAKE 1 TABLET BY MOUTH EVERY DAY 90 tablet  3   Semaglutide, 1 MG/DOSE, 4 MG/3ML SOPN Inject 1 mg as directed once a week.     No facility-administered medications prior to visit.    Review of Systems;  Patient denies headache, fevers, malaise, unintentional weight loss, skin rash, eye pain, sinus congestion and sinus pain, sore throat, dysphagia,  hemoptysis , cough, dyspnea, wheezing, chest pain, palpitations, orthopnea, edema, abdominal pain, nausea, melena, diarrhea, constipation, flank pain, dysuria, hematuria, urinary  Frequency, nocturia, numbness, tingling, seizures,  Focal weakness, Loss of consciousness,  Tremor, insomnia, depression, anxiety, and suicidal ideation.      Objective:  There were no vitals taken for this visit.  BP Readings from Last 3 Encounters:  11/24/22 136/70  08/19/22 121/78  07/11/22 (!) 158/86    Wt Readings from Last  3 Encounters:  11/24/22 240 lb 3.2 oz (109 kg)  10/27/22 238 lb (108 kg)  08/13/22 238 lb (108 kg)    Physical Exam  Lab Results  Component Value Date   HGBA1C 8.2 (H) 07/06/2023   HGBA1C 7.8 (A) 11/24/2022   HGBA1C 8.2 (H) 07/11/2022    Lab Results  Component Value Date   CREATININE 1.09 07/06/2023   CREATININE 0.89 11/24/2022   CREATININE 0.76 07/11/2022    Lab Results  Component Value Date   WBC 12.9 (H) 06/19/2022   HGB 13.7 06/19/2022   HCT 43.1 06/19/2022   PLT 283 06/19/2022   GLUCOSE 176 (H) 07/06/2023   CHOL 116 07/06/2023   TRIG 170.0 (H) 07/06/2023   HDL 37.60 (L) 07/06/2023   LDLDIRECT 57.0 07/06/2023   LDLCALC 45 07/06/2023   ALT 20 07/06/2023   AST 42 (H) 07/06/2023   NA 141 07/06/2023   K 3.8 07/06/2023   CL 94 (L) 07/06/2023   CREATININE 1.09 07/06/2023   BUN 14 07/06/2023   CO2 33 (H) 07/06/2023   TSH 1.10 01/12/2020   INR 0.9 07/22/2012   HGBA1C 8.2 (H) 07/06/2023   MICROALBUR 2.5 (H) 11/24/2022    ECHOCARDIOGRAM COMPLETE  Result Date: 01/20/2020    ECHOCARDIOGRAM REPORT   Patient Name:   [facility] Date of Exam: 01/19/2020 Medical Rec #:  [id number]         Height:       63.0 in Accession #:    [id number]        Weight:       275.0 lb Date of Birth:  Dec 01, 1955        BSA:          2.214 m Patient Age:    63 years          BP:           172/95 mmHg Patient Gender: F                 HR:           81 bpm. Exam Location:  [facility] Procedure: 2D Echo, Color Doppler, Cardiac Doppler and Intracardiac            Opacification Agent Indications:     R00.2 Palpitations  History:         Patient has no prior history of Echocardiogram examinations.                  COPD; Risk Factors:Hypertension, Dyslipidemia, Diabetes and                  Former Smoker.  Sonographer:     Humphrey Rolls RDCS (AE) Referring Phys:

## 2023-07-09 ENCOUNTER — Encounter: Payer: Self-pay | Admitting: Internal Medicine

## 2023-07-09 DIAGNOSIS — E119 Type 2 diabetes mellitus without complications: Secondary | ICD-10-CM | POA: Insufficient documentation

## 2023-07-09 NOTE — Assessment & Plan Note (Signed)
.   She is taking an ACE Inhibitor  with reduction in proteinuria.  Lab Results  Component Value Date   MICROALBUR 2.5 (H) 11/24/2022   MICROALBUR 6.0 (H) 03/04/2022    '

## 2023-07-09 NOTE — Assessment & Plan Note (Signed)
Continue 1 mg ozempic

## 2023-07-09 NOTE — Assessment & Plan Note (Signed)
Advised to reduce the breakfast novolog dose to 15 units  increase the lunchtime and evening doses to 25 units,  and increase Tresiba to 37 units.

## 2023-07-09 NOTE — Assessment & Plan Note (Addendum)
b12 deficiency diagnosed in 2021 . She will return for repeat levels   Lab Results  Component Value Date   VITAMINB12 101 (L) 06/26/2020

## 2023-07-09 NOTE — Assessment & Plan Note (Signed)
She remains chroncallydyspneic and hypoxic with minimal exertion despite use of 4 L./min supplemental oxygen. She now has mobility limitation due to   chronic hypoxia that prevents her from ccomplishing ADL's.

## 2023-07-09 NOTE — Assessment & Plan Note (Signed)
Reviewed findings of prior CT scan today..  Patient is tolerating high potency statin therapy with crestor 40 mg daily

## 2023-07-14 ENCOUNTER — Other Ambulatory Visit: Payer: Self-pay | Admitting: Internal Medicine

## 2023-07-27 DIAGNOSIS — J449 Chronic obstructive pulmonary disease, unspecified: Secondary | ICD-10-CM | POA: Diagnosis not present

## 2023-07-27 DIAGNOSIS — G839 Paralytic syndrome, unspecified: Secondary | ICD-10-CM | POA: Diagnosis not present

## 2023-08-03 ENCOUNTER — Telehealth: Payer: Self-pay

## 2023-08-03 ENCOUNTER — Other Ambulatory Visit (HOSPITAL_COMMUNITY): Payer: Self-pay

## 2023-08-03 NOTE — Telephone Encounter (Signed)
Submitted on line Patient's portion per verbal consent and faxed provider's portion to office.

## 2023-08-03 NOTE — Telephone Encounter (Signed)
Reaching out to pt for 2025 renewal

## 2023-08-04 ENCOUNTER — Other Ambulatory Visit: Payer: Medicare HMO | Admitting: Pharmacist

## 2023-08-04 DIAGNOSIS — E119 Type 2 diabetes mellitus without complications: Secondary | ICD-10-CM

## 2023-08-04 NOTE — Patient Instructions (Signed)
[name],   It was a pleasure to see you today! As we discussed:?  Your sugars the past few days have looked great! Continue the work on your diet/evening foods. This has made a big difference in recent days on your [place] report.    Continue all medications as you have been taking them.  Please let me know if you see sugars below 70 mg/dL, or if your sugars significant higher than your usual.  We discussed continuing your Ozempic 1.0 mg dose, though will plan to increase to the next dose (2.0 mg) on your next Novo shipment in the new year.   Please reach out prior to your next scheduled appointment should you have any questions or concerns.   Thank you!   Future Appointments  Date Time Provider Department Center  09/18/2023  2:00 PM [facility] CCM PHARMACIST [facility]  10/28/2023  2:30 PM [facility] ANNUAL WELLNESS VISIT [facility]    Catherine [name], PharmD - Clinical Pharmacist

## 2023-08-04 NOTE — Progress Notes (Signed)
08/04/2023 Name: Catherine [name] MRN: [id number] DOB: 1955-10-21  Subjective  Chief Complaint  Patient presents with   Diabetes    Reason for visit: Catherine [name] is a 67 y.o. year old female who presented for a telephone visit.   They were referred to the pharmacist by their PCP for assistance in managing diabetes.   Care Team: Primary Care Provider: Sherlene Shams, MD  Reason for visit: ?  Catherine [name] is a 67 y.o. female with a history of diabetes (type 2), who presents today for a follow up diabetes pharmacotherapy visit.? Pertinent PMH also includes CAD, HTN, OSA, emphysema.    Known DM Complications: nephropathy w albuminuria  Summary of Recent Change:  10/30: ??Tresiba 24?26 units qd. Novolog 15/25/25 (dec breakfast 2/2 mid-day lows) 10/8: ??Evaristo Bury 30?34 units (13% inc)  Since Last visit / History of Present Illness: ?  Patient reports implementing plan from last visit. Denies adverse effects with titration of Ozempic previously. Has been doing well on 1 mg dose for almost 2 months. No nausea/abdominal discomfort. Stool remains regular q2-3 days. Using Lactulose a couple times per week if needed. Reports rare hypoglycemia, improved since reducing AM Novolog dose.  Prescription drug coverage: Payor: [employer] MEDICARE / Plan: [employer] MEDICARE HMO/PPO / Product Type: *No Product type* / .   Reports that all medications are affordable.  Current Patient Assistance: NovoNordisk (Ozempic, [place], [place]) [facility] Med Assistance team has reached out to patient for 2025 renewal.  Medication Adherence: Patient denies missing doses of their medication.    Reported DM Regimen: ?  Metformin 1000 mg twice daily Ozempic 1 mg sq once weekly (increased on 06/15/23)  Tresiba 36 units once sq daily Novolog 15/25/25 units sq [facility]   DM medications tried in the past:?  N/A  Overall, patient thinks that blood sugars are lower since last diabetes related visit. Also feels  that sugars are more stable (less random spikes)  SMBG  Per Libre 3 CGM : ? Uses smart phone CGM Report (Date Range 11/12 - 11/25) ABG 194 mg/dL; GMI 4.6%; Variability 26%; TIR 42%, high 45%, very high 14%, low 0%, very low 0%   Hypo/Hyperglycemia: ?  Symptoms of hypoglycemia since last visit:? yes; once mid-afternoon.  Symptoms of hyperglycemia since last visit:? no - none  Reported Diet: Patient typically eats 3 meals per day. Has been working hard to reduce nighttime sugars Current meal patterns:  - Breakfast (7-10 am): oatmeal and toast; sandwich - Lunch (2-3 pm): frozen meal, soup, or a sandwich - Supper (7-10 pm): frozen dinners; she and son will order take out - once a week chinese (eats half);  - Snacks: apple, small handful of peanuts  - Drinks: diet mt dew, some water    Exercise: very little right now, limited by mobility.   DM Prevention:  Statin: Taking; high intensity.?  History of chronic kidney disease? yes History of albuminuria? yes (mildly increased, 10.7 in 2022), last UACR on 11/24/22 = 2.9 mg/g ACE/ARB - Taking losartan 100 mg daily; Urine MA/CR Ratio - normal.  Last eye exam: No recent record - due; No retinopathy present on previous exams Last foot exam: 12/13/2020 Tobacco Use: Former smoker Immunizations:? Flu: Up to date (last 07/08/23), Pneumococcal: PPSV23 (12/08/2011) PCV13 (06/12/2014). Not received since age 63, Shingrix: No Record - DUE, Covid (no record - DUE)  Cardiovascular Risk Reduction History of clinical ASCVD? No* The ASCVD Risk score (Arnett DK, et al., 2019) failed to calculate for the following reasons:  The valid total cholesterol range is 130 to 320 mg/dL CT findings: *Coronary artery disease noted - Aortic atherosclerosis in addition to the coronary arteries, including calcified atherosclerotic plaque in the LAD. Severity not detailed. History of heart failure? no N/A History of hyperlipidemia? yes Current BMI: 45.3 kg/m2 (Ht 61 in, Wt  109 kg) Taking statin? yes; high intensity (rosuvastatin 40 mg daily) Taking aspirin? Taking   Taking SGLT-2i? no Taking GLP- 1 RA? yes   Hypertension Current Medications: hydrochlorothiazide 25 mg daily, losartan 100 mg daily - notes no refills remain on this, metoprolol succinate 25 mg daily  Patient is not checking blood pressure at home currently. Denies dizziness/lightheadedness. No headaches, vision changes, SOB or chest pains/pressures.     _______________________________________________  Objective    Review of Systems:?  Constitutional:? No fever, chills or unintentional weight loss  Cardiovascular:? No chest pain or pressure, shortness of breath, dyspnea on exertion, orthopnea or LE edema  Pulmonary:? No cough or shortness of breath  GI:? No nausea, vomiting, constipation, diarrhea, abdominal pain, dyspepsia, change in bowel habits  Endocrine:? No polyuria, polyphagia or blurred vision  Psych:? No depression, anxiety, insomnia    Physical Examination:  Vitals:  Wt Readings from Last 3 Encounters:  07/08/23 250 lb (113.4 kg)  11/24/22 240 lb 3.2 oz (109 kg)  10/27/22 238 lb (108 kg)   BP Readings from Last 3 Encounters:  07/08/23 134/86  11/24/22 136/70  08/19/22 121/78   Pulse Readings from Last 3 Encounters:  07/08/23 90  11/24/22 95  03/04/22 83     Labs:?  Lab Results  Component Value Date   HGBA1C 8.2 (H) 07/06/2023   HGBA1C 7.8 (A) 11/24/2022   HGBA1C 8.2 (H) 07/11/2022   GLUCOSE 176 (H) 07/06/2023   MICRALBCREAT 2.9 11/24/2022   MICRALBCREAT 6.0 03/04/2022   MICRALBCREAT 10.7 12/13/2020   CREATININE 1.09 07/06/2023   CREATININE 0.89 11/24/2022   CREATININE 0.76 07/11/2022   GFR 52.04 (L) 07/06/2023   GFR 66.66 11/24/2022   GFR 80.78 07/11/2022    Lab Results  Component Value Date   CHOL 116 07/06/2023   LDLCALC 45 07/06/2023   LDLCALC 45 12/13/2020   LDLCALC 73 12/10/2018   LDLDIRECT 57.0 07/06/2023   HDL 37.60 (L) 07/06/2023   HDL 46.90  11/24/2022   HDL 43.50 07/11/2022   AST 42 (H) 07/06/2023   AST 29 11/24/2022   ALT 20 07/06/2023   ALT 14 11/24/2022      Chemistry      Component Value Date/Time   NA 141 07/06/2023 1501   NA 143 01/03/2013 1506   K 3.8 07/06/2023 1501   K 3.4 (L) 01/03/2013 1506   CL 94 (L) 07/06/2023 1501   CL 107 01/03/2013 1506   CO2 33 (H) 07/06/2023 1501   CO2 28 01/03/2013 1506   BUN 14 07/06/2023 1501   BUN 8 01/03/2013 1506   CREATININE 1.09 07/06/2023 1501   CREATININE 0.86 12/10/2018 1447      Component Value Date/Time   CALCIUM 9.8 07/06/2023 1501   CALCIUM 8.3 (L) 01/03/2013 1506   ALKPHOS 79 07/06/2023 1501   ALKPHOS 189 (H) 11/26/2012 2325   AST 42 (H) 07/06/2023 1501   AST 42 (H) 11/26/2012 2325   ALT 20 07/06/2023 1501   ALT 32 11/26/2012 2325   BILITOT 0.4 07/06/2023 1501   BILITOT 0.3 11/26/2012 2325       The ASCVD Risk score (Arnett DK, et al., 2019) failed to calculate for  the following reasons:   The valid total cholesterol range is 130 to 320 mg/dL  Assessment and Plan:   1. Diabetes, type 2: Uncontrolled per last A1c of 8.0 (07/08/23), relatively unchanged from previous 7.8% (11/24/22). Goal <7% without hypoglycemia. Overnight sugars remain somewhat variable depending on evening meal, though improved in the past week. Intermittent hyperglycemia in later part of the day, though sufficiently reduced with current prandial doses. Dose increase would likely cause inc in lows. In the new year, will plan for Ozempic titration on Novo MAP renewal for further improvement of post-prandial hyperglycemia.  - Current Regimen: metformin 1000 mg BID, Ozempic 1 mg weekly, Tresiba 36 units daily, Novolog 15/25/25 units TIDAC Continue Tresiba 36 units daily, Novolog 15/25/25 units B/L/D Continue all other medications today without changes.  Reviewed signs/symptoms/treatment of hypoglycemia  Future Consideration: GLP1-RA: Ozempic titration to 2.0 mg weekly in January on [employer]. January f/u to review CGM prior. Mounjaro cost prohibitive given no MAP.  SGLT2i: Reasonable - Consider obtaining through MAP in the new year [AZ&Me Farxiga; [employer]ares Jardiance) SU: Not ideal in the setting of insulin use. Safer options available.  TZD: Avoiding due to possible weight gain and possible increase in fracture risk with minimal A1c-reduction.    2. HTN: Slightly elevated based on last clinic BP of 134/86 mmHg (11/2022), goal <130/80 mmHg. Does not monitor BP at home. Denies lightheadedness, dizziness, SOB, CP, vision changes.  Current Regimen: losartan 100 mg daily, hydrochlorothiazide 25 mg daily, metoprolol succinate 25 mg daily Continue medications without changes.    3. ASCVD (primary prevention): The ASCVD Risk score (Arnett DK, et al., 2019) failed to calculate for the following reasons:   The valid total cholesterol range is 130 to 320 mg/dL. On last lipid panel (11/24/22) LDL was 69 mg/dL, above goal of >95 mg/dL for confirmed atherosclerotic disease as well as numerous risk factors.  Key risk factors include: diabetes, hypertension, hyperlipidemia, former smoker, BMI >30 kg/m2, sedentary lifestyle, and Known CAD (aortic atherosclerosis on imaging) Current Regimen: rosuvastatin 40 mg daily Continue medications today without changes.  Future Consideration: Ezetimibe 10 mg daily reasonable for further LDL reduction if patient amenable and would provide additional CV benefit   4. Healthcare Maintenance:  Pneumococcal - Current status: Due (PCV13 and PPSV23 prior to age 75).   Shingles - Current status: No record - Due Influenza - Current status: Up to date 07/08/23 Due to receive the following vaccines: Shingrix, PCV20, and Covid Booster One dose of PCV20 or PCV21 or PPSV23 then series is complete (has been >5 years since last pneumo vax)   Follow Up PCP follow up scheduled in 3 weeks; 07/08/23 Follow up with clinical pharmacist via phone ~4 weeks after PCP  visit.  ?   Future Appointments  Date Time Provider Department Center  09/18/2023  2:00 PM [facility] CCM PHARMACIST [facility]  10/28/2023  2:30 PM [facility] ANNUAL WELLNESS VISIT [facility]   Loree Fee, PharmD Clinical Pharmacist [facility] [phone]

## 2023-08-06 ENCOUNTER — Other Ambulatory Visit: Payer: Self-pay | Admitting: Internal Medicine

## 2023-08-20 NOTE — Telephone Encounter (Signed)
Provider portion faxed to [employer] with Insurance card.

## 2023-08-25 ENCOUNTER — Other Ambulatory Visit: Payer: Self-pay | Admitting: Internal Medicine

## 2023-08-25 NOTE — Telephone Encounter (Signed)
Refaxed signed provider portion to novo nordisk

## 2023-08-26 DIAGNOSIS — J449 Chronic obstructive pulmonary disease, unspecified: Secondary | ICD-10-CM | POA: Diagnosis not present

## 2023-08-26 DIAGNOSIS — G839 Paralytic syndrome, unspecified: Secondary | ICD-10-CM | POA: Diagnosis not present

## 2023-08-27 ENCOUNTER — Other Ambulatory Visit: Payer: Self-pay | Admitting: Family

## 2023-08-28 ENCOUNTER — Ambulatory Visit: Payer: Self-pay | Admitting: Internal Medicine

## 2023-08-28 ENCOUNTER — Ambulatory Visit: Payer: Medicare HMO | Admitting: Internal Medicine

## 2023-08-28 NOTE — Telephone Encounter (Addendum)
Copied from CRM [id number]. Topic: Clinical - Red Word Triage >> Aug 28, 2023 11:44 AM Steele Sizer wrote: Red Word that prompted transfer to Nurse Triage: PT stated that she has a spot on her back and it has been bleeding on a regular basis, and her PCP has given her a referral to the dermatologist office however the spot is hurting and doesn't think shell be able to wait for the appointment.  Chief Complaint: lesion on back is swollen, bleeding, also has yellow discharge and pain Symptoms: pain, bleeding Frequency: pain started today Pertinent Negatives: Patient denies fever, numbness, weakness and rash Disposition: [] ED /[] Urgent Care (no appt availability in office) / [x] Appointment(In office/virtual)/ []  [facility]/ [] Home Care/ [] Refused Recommended Disposition /[] [place] [facility]/ []  Follow-up with PCP Additional Notes: patient concerned re: bleeding, painful lesion on back. States has a referral to dermatology but she can't wait that long due to the pain.  Apt. Made with pcp.  Instructed to go to the er if becomes worse.   Reason for Disposition  Looks like a boil, infected sore, deep ulcer or other infected rash (spreading redness, pus)  Answer Assessment - Initial Assessment Questions 1. ONSET: "When did the pain start?"     Has lesion on back and it's bleeding, growing and hurting. 2. LOCATION: "Where is the pain located?"     Right side of back  3. PAIN: "How bad is the pain?" (Scale 1-10; or mild, moderate, severe)   - MILD (1-3): doesn't interfere with normal activities   - MODERATE (4-7): interferes with normal activities (e.g., work or school) or awakens from sleep   - SEVERE (8-10): excruciating pain, unable to do any normal activities, unable to move arm at all due to pain     5  5. CAUSE: "What do you think is causing the shoulder pain?"     Lesion on back 6. OTHER SYMPTOMS: "Do you have any other symptoms?" (e.g., neck pain, swelling, rash, fever,  numbness, weakness)     No  Protocols used: Shoulder Pain-A-AH

## 2023-08-31 ENCOUNTER — Other Ambulatory Visit
Admission: RE | Admit: 2023-08-31 | Discharge: 2023-08-31 | Disposition: A | Discharge status: Home / Self Care | Payer: Medicare HMO | Source: Ambulatory Visit | Attending: Family Medicine | Admitting: Family Medicine

## 2023-08-31 ENCOUNTER — Ambulatory Visit (INDEPENDENT_AMBULATORY_CARE_PROVIDER_SITE_OTHER): Payer: Medicare HMO | Admitting: Family Medicine

## 2023-08-31 ENCOUNTER — Encounter: Payer: Self-pay | Admitting: Family Medicine

## 2023-08-31 VITALS — BP 134/68 | HR 77 | Temp 97.9°F | Resp 18 | Ht 63.0 in | Wt 246.0 lb

## 2023-08-31 DIAGNOSIS — J9611 Chronic respiratory failure with hypoxia: Secondary | ICD-10-CM | POA: Diagnosis not present

## 2023-08-31 DIAGNOSIS — E876 Hypokalemia: Secondary | ICD-10-CM | POA: Diagnosis not present

## 2023-08-31 DIAGNOSIS — L989 Disorder of the skin and subcutaneous tissue, unspecified: Secondary | ICD-10-CM | POA: Insufficient documentation

## 2023-08-31 LAB — CBC WITH DIFFERENTIAL/PLATELET
Abs Immature Granulocytes: 0.06 10*3/uL (ref 0.00–0.07)
Basophils Absolute: 0.1 10*3/uL (ref 0.0–0.1)
Basophils Relative: 1 %
Eosinophils Absolute: 0.3 10*3/uL (ref 0.0–0.5)
Eosinophils Relative: 2 %
HCT: 36.7 % (ref 36.0–46.0)
Hemoglobin: 12 g/dL (ref 12.0–15.0)
Immature Granulocytes: 1 %
Lymphocytes Relative: 23 %
Lymphs Abs: 2.9 10*3/uL (ref 0.7–4.0)
MCH: 28.2 pg (ref 26.0–34.0)
MCHC: 32.7 g/dL (ref 30.0–36.0)
MCV: 86.4 fL (ref 80.0–100.0)
Monocytes Absolute: 0.8 10*3/uL (ref 0.1–1.0)
Monocytes Relative: 7 %
Neutro Abs: 8.5 10*3/uL — ABNORMAL HIGH (ref 1.7–7.7)
Neutrophils Relative %: 66 %
Platelets: 331 10*3/uL (ref 150–400)
RBC: 4.25 MIL/uL (ref 3.87–5.11)
RDW: 13.4 % (ref 11.5–15.5)
WBC: 12.7 10*3/uL — ABNORMAL HIGH (ref 4.0–10.5)
nRBC: 0 % (ref 0.0–0.2)

## 2023-08-31 LAB — COMPREHENSIVE METABOLIC PANEL
ALT: 20 U/L (ref 0–44)
AST: 51 U/L — ABNORMAL HIGH (ref 15–41)
Albumin: 3.4 g/dL — ABNORMAL LOW (ref 3.5–5.0)
Alkaline Phosphatase: 74 U/L (ref 38–126)
Anion gap: 16 — ABNORMAL HIGH (ref 5–15)
BUN: 19 mg/dL (ref 8–23)
CO2: 32 mmol/L (ref 22–32)
Calcium: 9 mg/dL (ref 8.9–10.3)
Chloride: 86 mmol/L — ABNORMAL LOW (ref 98–111)
Creatinine, Ser: 1.27 mg/dL — ABNORMAL HIGH (ref 0.44–1.00)
GFR, Estimated: 46 mL/min — ABNORMAL LOW (ref >60)
Glucose, Bld: 225 mg/dL — ABNORMAL HIGH (ref 70–99)
Potassium: 2.1 mmol/L — CL (ref 3.5–5.1)
Sodium: 134 mmol/L — ABNORMAL LOW (ref 135–145)
Total Bilirubin: 0.7 mg/dL (ref <1.2)
Total Protein: 8.1 g/dL (ref 6.5–8.1)

## 2023-08-31 MED ORDER — IPRATROPIUM-ALBUTEROL 0.5-2.5 (3) MG/3ML IN SOLN: 3.0000 mL | Freq: Four times a day (QID) | RESPIRATORY_TRACT | 3 refills | Status: DC | PRN

## 2023-08-31 NOTE — Patient Instructions (Signed)
It was a pleasure meeting you today. Thank you for allowing me to take part in your health care.  Our goals for today as we discussed include:  Go to [facility] [address] [place] [phone]  Follow up with Dermatology   This is a list of the screening recommended for you and due dates:  Health Maintenance  Topic Date Due   COVID-19 Vaccine (1) Never done   Zoster (Shingles) Vaccine (1 of 2) Never done   DTaP/Tdap/Td vaccine (2 - Td or Tdap) 12/23/2015   Eye exam for diabetics  01/29/2017   Pneumonia Vaccine (3 of 3 - PPSV23 or PCV20) 07/31/2019   DEXA scan (bone density measurement)  Never done   Screening for Lung Cancer  01/18/2021   Complete foot exam   12/13/2021   Medicare Annual Wellness Visit  10/28/2023   Mammogram  07/07/2024*   Colon Cancer Screening  07/07/2024*   Yearly kidney health urinalysis for diabetes  11/24/2023   Hemoglobin A1C  01/04/2024   Yearly kidney function blood test for diabetes  07/05/2024   Flu Shot  Completed   Hepatitis C Screening  Completed   HPV Vaccine  Aged Out  *Topic was postponed. The date shown is not the original due date.    If you have any questions or concerns, please do not hesitate to call the office at [phone].  I look forward to our next visit and until then take care and stay safe.  Regards,   Dana Allan, MD   [facility]

## 2023-08-31 NOTE — Progress Notes (Unsigned)
SUBJECTIVE:   Chief Complaint  Patient presents with   Rash    Place on back where bra rubs X long time. Started getting larger a few months ago.   HPI Presents for acute visit  Discussed the use of AI scribe software for clinical note transcription with the patient, who gave verbal consent to proceed.  History of Present Illness The patient presents with a longstanding, progressively enlarging lesion on their back. The lesion, which started small, has significantly increased in size over the past few months. Recently, it has become painful and has started to drain. Despite the discomfort and drainage, the patient denies any associated fever.  The patient sought advice from their primary care physician who recommended a dermatology consultation. However, the appointment is not scheduled until July. The patient has not sought any interim care at urgent care or similar facilities.  The patient reports difficulty with lying down due to the lesion, but can manage by lying on their side. They also mention issues with balance, although it is unclear if this is related to the lesion or a separate issue. The patient does not drive and relies on their spouse for transportation.  The lesion, initially the size of a dime, has grown to the size of a fifty-cent piece within a span of five to six months. The patient has not experienced any systemic symptoms such as fever.    PERTINENT PMH / PSH: As above  OBJECTIVE:  BP 134/68   Pulse 77   Temp 97.9 F (36.6 C)   Resp 18   Ht 5\' 3"  (1.6 m)   Wt 246 lb (111.6 kg)   SpO2 95%   BMI 43.58 kg/m    Physical Exam Vitals reviewed.  Constitutional:      General: She is not in acute distress.    Appearance: Normal appearance. She is obese. She is not ill-appearing, toxic-appearing or diaphoretic.  Eyes:     General:        Right eye: No discharge.        Left eye: No discharge.     Conjunctiva/sclera: Conjunctivae normal.  Cardiovascular:      Rate and Rhythm: Normal rate.  Pulmonary:     Effort: Respiratory distress: Oxygen dependent, 2 L.  Musculoskeletal:        General: Normal range of motion.  Skin:    General: Skin is warm and dry.     Findings: Rash present. Rash is scaling.     Comments: Multiple areas of keratotic lesions on back  Large raised, friable mass on posterior right thorax.  No active drainage noted.  Yellow discharge noted on old bandage.  Neurological:     Mental Status: She is alert and oriented to person, place, and time. Mental status is at baseline.  Psychiatric:        Mood and Affect: Mood normal.        Behavior: Behavior normal.        Thought Content: Thought content normal.        Judgment: Judgment normal.          08/31/2023    4:29 PM 07/08/2023    4:10 PM 11/24/2022   11:07 AM 10/27/2022    3:49 PM 08/13/2022    4:39 PM  Depression screen PHQ 2/9  Decreased Interest 1 3 0 0 0  Down, Depressed, Hopeless 0 0 0 0 0  PHQ - 2 Score 1 3 0 0 0  Altered sleeping  3 2 3   0  Tired, decreased energy 3 3 0  0  Change in appetite 1 3 0  0  Feeling bad or failure about yourself  0 0 0  0  Trouble concentrating 0 0 0  0  Moving slowly or fidgety/restless 0 0 0  0  Suicidal thoughts 0 0 0  0  PHQ-9 Score 8 11 3   0  Difficult doing work/chores Not difficult at all Not difficult at all Not difficult at all  Not difficult at all      08/31/2023    4:29 PM 07/08/2023    4:10 PM 11/24/2022   11:07 AM 03/04/2022    1:19 PM  GAD 7 : Generalized Anxiety Score  Nervous, Anxious, on Edge 0 0 0 0  Control/stop worrying 0 0 0 0  Worry too much - different things 0 0 0 0  Trouble relaxing 1 0 0 0  Restless 1 0 0 0  Easily annoyed or irritable 0 0 0 0  Afraid - awful might happen 0 0 0 0  Total GAD 7 Score 2 0 0 0  Anxiety Difficulty Not difficult at all Not difficult at all Not difficult at all Not difficult at all    ASSESSMENT/PLAN:  Skin lesion of back Assessment & Plan: Rapidly  growing, painful, draining lesion on the back present for several months. Concern for potential malignancy given rapid growth and change in characteristics. Lesion raised and friable in some areas. Not amenable to in-office removal due to size and risk of bleeding. -Refer to Dermatology as soon as possible for evaluation and potential biopsy. Current appointment in January may need to be expedited. -Unable to obtain sample for culture, no drainage today -Clean and dress the lesion in the clinic today. -Consider urgent care if condition worsens before Dermatology appointment.  -Check labs today  Orders: -     CBC with Differential/Platelet; Future -     Comprehensive metabolic panel; Future  Chronic hypoxemic respiratory failure (HCC) -     Ipratropium-Albuterol; Take 3 mLs by nebulization every 6 (six) hours as needed.  Dispense: 360 mL; Refill: 3  Hypokalemia Assessment & Plan: Potassium 2.1.  Had stopped taking po supplements.  Notified patient of results.Currently asymptomatic Recommend she go to ED for evaluation and repletion    PDMP reviewed  Return if symptoms worsen or fail to improve, for PCP.  Dana Allan, MD

## 2023-09-01 ENCOUNTER — Observation Stay: Payer: Medicare HMO

## 2023-09-01 ENCOUNTER — Observation Stay
Admission: EM | Admit: 2023-09-01 | Discharge: 2023-09-02 | Disposition: A | Discharge status: Home / Self Care | Payer: Medicare HMO | Attending: Internal Medicine | Admitting: Internal Medicine

## 2023-09-01 ENCOUNTER — Encounter: Payer: Self-pay | Admitting: Family Medicine

## 2023-09-01 ENCOUNTER — Other Ambulatory Visit: Payer: Self-pay

## 2023-09-01 DIAGNOSIS — Z87891 Personal history of nicotine dependence: Secondary | ICD-10-CM | POA: Diagnosis not present

## 2023-09-01 DIAGNOSIS — E876 Hypokalemia: Secondary | ICD-10-CM | POA: Diagnosis not present

## 2023-09-01 DIAGNOSIS — R531 Weakness: Secondary | ICD-10-CM

## 2023-09-01 DIAGNOSIS — J9611 Chronic respiratory failure with hypoxia: Secondary | ICD-10-CM | POA: Diagnosis not present

## 2023-09-01 DIAGNOSIS — I10 Essential (primary) hypertension: Secondary | ICD-10-CM | POA: Insufficient documentation

## 2023-09-01 DIAGNOSIS — Z7982 Long term (current) use of aspirin: Secondary | ICD-10-CM | POA: Diagnosis not present

## 2023-09-01 DIAGNOSIS — L989 Disorder of the skin and subcutaneous tissue, unspecified: Secondary | ICD-10-CM | POA: Insufficient documentation

## 2023-09-01 DIAGNOSIS — E1165 Type 2 diabetes mellitus with hyperglycemia: Secondary | ICD-10-CM | POA: Insufficient documentation

## 2023-09-01 DIAGNOSIS — J4489 Other specified chronic obstructive pulmonary disease: Secondary | ICD-10-CM | POA: Insufficient documentation

## 2023-09-01 DIAGNOSIS — Z7984 Long term (current) use of oral hypoglycemic drugs: Secondary | ICD-10-CM | POA: Insufficient documentation

## 2023-09-01 DIAGNOSIS — Z9861 Coronary angioplasty status: Secondary | ICD-10-CM | POA: Diagnosis not present

## 2023-09-01 LAB — CBC
HCT: 35.5 % — ABNORMAL LOW (ref 36.0–46.0)
Hemoglobin: 11.2 g/dL — ABNORMAL LOW (ref 12.0–15.0)
MCH: 27.7 pg (ref 26.0–34.0)
MCHC: 31.5 g/dL (ref 30.0–36.0)
MCV: 87.7 fL (ref 80.0–100.0)
Platelets: 334 10*3/uL (ref 150–400)
RBC: 4.05 MIL/uL (ref 3.87–5.11)
RDW: 13.3 % (ref 11.5–15.5)
WBC: 12.7 10*3/uL — ABNORMAL HIGH (ref 4.0–10.5)
nRBC: 0 % (ref 0.0–0.2)

## 2023-09-01 LAB — BASIC METABOLIC PANEL
Anion gap: 17 — ABNORMAL HIGH (ref 5–15)
BUN: 22 mg/dL (ref 8–23)
CO2: 31 mmol/L (ref 22–32)
Calcium: 9.1 mg/dL (ref 8.9–10.3)
Chloride: 86 mmol/L — ABNORMAL LOW (ref 98–111)
Creatinine, Ser: 1.29 mg/dL — ABNORMAL HIGH (ref 0.44–1.00)
GFR, Estimated: 45 mL/min — ABNORMAL LOW (ref >60)
Glucose, Bld: 222 mg/dL — ABNORMAL HIGH (ref 70–99)
Potassium: 2.1 mmol/L — CL (ref 3.5–5.1)
Sodium: 134 mmol/L — ABNORMAL LOW (ref 135–145)

## 2023-09-01 LAB — MAGNESIUM: Magnesium: 2 mg/dL (ref 1.7–2.4)

## 2023-09-01 LAB — PHOSPHORUS: Phosphorus: 1.3 mg/dL — ABNORMAL LOW (ref 2.5–4.6)

## 2023-09-01 LAB — CBG MONITORING, ED
Glucose-Capillary: 298 mg/dL — ABNORMAL HIGH (ref 70–99)
Glucose-Capillary: 249 mg/dL — ABNORMAL HIGH (ref 70–99)
Glucose-Capillary: 229 mg/dL — ABNORMAL HIGH (ref 70–99)

## 2023-09-01 LAB — POTASSIUM: Potassium: 2.7 mmol/L — CL (ref 3.5–5.1)

## 2023-09-01 MED ORDER — AMLODIPINE BESYLATE 5 MG PO TABS
5.0000 mg | ORAL_TABLET | Freq: Every day | ORAL | Status: DC
Start: 2023-09-02 — End: 2023-09-02
  Administered 2023-09-02: 5 mg via ORAL
  Filled 2023-09-01: qty 1

## 2023-09-01 MED ORDER — ALBUTEROL SULFATE (2.5 MG/3ML) 0.083% IN NEBU
2.5000 mg | INHALATION_SOLUTION | Freq: Four times a day (QID) | RESPIRATORY_TRACT | Status: DC | PRN
  Administered 2023-09-01 – 2023-09-02 (×3): 2.5 mg via RESPIRATORY_TRACT
  Filled 2023-09-01 (×3): qty 3

## 2023-09-01 MED ORDER — BUDESON-GLYCOPYRROL-FORMOTEROL 160-9-4.8 MCG/ACT IN AERO: 2.0000 | INHALATION_SPRAY | Freq: Two times a day (BID) | RESPIRATORY_TRACT | Status: DC

## 2023-09-01 MED ORDER — PAROXETINE HCL 20 MG PO TABS
20.0000 mg | ORAL_TABLET | Freq: Every day | ORAL | Status: DC
  Administered 2023-09-02: 20 mg via ORAL
  Filled 2023-09-01: qty 1

## 2023-09-01 MED ORDER — POTASSIUM & SODIUM PHOSPHATES 280-160-250 MG PO PACK
1.0000 | PACK | Freq: Three times a day (TID) | ORAL | Status: AC
  Administered 2023-09-01 – 2023-09-02 (×3): 1 via ORAL
  Filled 2023-09-01 (×4): qty 1

## 2023-09-01 MED ORDER — METOPROLOL SUCCINATE ER 25 MG PO TB24
25.0000 mg | ORAL_TABLET | Freq: Every day | ORAL | Status: DC
  Administered 2023-09-02: 25 mg via ORAL
  Filled 2023-09-01: qty 1

## 2023-09-01 MED ORDER — CYCLOBENZAPRINE HCL 10 MG PO TABS: 5.0000 mg | ORAL_TABLET | Freq: Three times a day (TID) | ORAL | Status: DC | PRN

## 2023-09-01 MED ORDER — POTASSIUM CHLORIDE CRYS ER 20 MEQ PO TBCR
40.0000 meq | EXTENDED_RELEASE_TABLET | ORAL | Status: AC
  Administered 2023-09-01 (×2): 40 meq via ORAL
  Filled 2023-09-01 (×2): qty 2

## 2023-09-01 MED ORDER — POTASSIUM CHLORIDE 10 MEQ/100ML IV SOLN
10.0000 meq | INTRAVENOUS | Status: AC
  Administered 2023-09-01 – 2023-09-02 (×4): 10 meq via INTRAVENOUS
  Filled 2023-09-01 (×4): qty 100

## 2023-09-01 MED ORDER — INSULIN ASPART 100 UNIT/ML IJ SOLN
0.0000 [IU] | Freq: Three times a day (TID) | INTRAMUSCULAR | Status: DC
  Administered 2023-09-01: 3 [IU] via SUBCUTANEOUS
  Administered 2023-09-02 (×2): 2 [IU] via SUBCUTANEOUS
  Filled 2023-09-01 (×3): qty 1

## 2023-09-01 MED ORDER — ASPIRIN 81 MG PO TBEC
81.0000 mg | DELAYED_RELEASE_TABLET | Freq: Every day | ORAL | Status: DC
  Administered 2023-09-02: 81 mg via ORAL
  Filled 2023-09-01: qty 1

## 2023-09-01 MED ORDER — POTASSIUM CHLORIDE CRYS ER 20 MEQ PO TBCR
40.0000 meq | EXTENDED_RELEASE_TABLET | ORAL | Status: DC
  Administered 2023-09-01: 40 meq via ORAL
  Filled 2023-09-01: qty 2

## 2023-09-01 MED ORDER — INSULIN ASPART 100 UNIT/ML IJ SOLN
25.0000 [IU] | Freq: Two times a day (BID) | INTRAMUSCULAR | Status: DC
  Administered 2023-09-01 – 2023-09-02 (×2): 25 [IU] via SUBCUTANEOUS
  Filled 2023-09-01 (×2): qty 1

## 2023-09-01 MED ORDER — INSULIN GLARGINE-YFGN 100 UNIT/ML ~~LOC~~ SOLN
36.0000 [IU] | Freq: Every day | SUBCUTANEOUS | Status: DC
  Administered 2023-09-01 – 2023-09-02 (×2): 36 [IU] via SUBCUTANEOUS
  Filled 2023-09-01 (×2): qty 0.36

## 2023-09-01 MED ORDER — FLUTICASONE FUROATE-VILANTEROL 200-25 MCG/ACT IN AEPB
1.0000 | INHALATION_SPRAY | Freq: Every day | RESPIRATORY_TRACT | Status: DC
  Administered 2023-09-02: 1 via RESPIRATORY_TRACT
  Filled 2023-09-01 (×2): qty 28

## 2023-09-01 MED ORDER — LOSARTAN POTASSIUM 50 MG PO TABS
100.0000 mg | ORAL_TABLET | Freq: Every day | ORAL | Status: DC
  Administered 2023-09-02: 100 mg via ORAL
  Filled 2023-09-01: qty 2

## 2023-09-01 MED ORDER — POTASSIUM CHLORIDE CRYS ER 20 MEQ PO TBCR
40.0000 meq | EXTENDED_RELEASE_TABLET | Freq: Once | ORAL | Status: AC
  Administered 2023-09-01: 40 meq via ORAL
  Filled 2023-09-01: qty 2

## 2023-09-01 MED ORDER — ROSUVASTATIN CALCIUM 10 MG PO TABS
40.0000 mg | ORAL_TABLET | Freq: Every day | ORAL | Status: DC
  Administered 2023-09-02: 40 mg via ORAL
  Filled 2023-09-01: qty 4

## 2023-09-01 MED ORDER — POTASSIUM CHLORIDE 10 MEQ/100ML IV SOLN
10.0000 meq | Freq: Once | INTRAVENOUS | Status: AC
  Administered 2023-09-01: 10 meq via INTRAVENOUS
  Filled 2023-09-01: qty 100

## 2023-09-01 MED ORDER — INSULIN ASPART 100 UNIT/ML IJ SOLN
15.0000 [IU] | Freq: Every day | INTRAMUSCULAR | Status: DC
  Administered 2023-09-02: 15 [IU] via SUBCUTANEOUS
  Filled 2023-09-01: qty 1

## 2023-09-01 MED ORDER — PROMETHAZINE HCL 25 MG PO TABS
12.5000 mg | ORAL_TABLET | Freq: Every evening | ORAL | Status: DC | PRN
  Administered 2023-09-02: 12.5 mg via ORAL
  Filled 2023-09-01 (×2): qty 1

## 2023-09-01 MED ORDER — MELATONIN 5 MG PO TABS
5.0000 mg | ORAL_TABLET | Freq: Every day | ORAL | Status: DC
  Administered 2023-09-01: 5 mg via ORAL
  Filled 2023-09-01: qty 1

## 2023-09-01 MED ORDER — UMECLIDINIUM BROMIDE 62.5 MCG/ACT IN AEPB
1.0000 | INHALATION_SPRAY | Freq: Every day | RESPIRATORY_TRACT | Status: DC
  Administered 2023-09-02: 1 via RESPIRATORY_TRACT
  Filled 2023-09-01 (×2): qty 7

## 2023-09-01 MED ORDER — INSULIN ASPART 100 UNIT/ML IJ SOLN: 20.0000 [IU] | Freq: Three times a day (TID) | INTRAMUSCULAR | Status: DC

## 2023-09-01 MED ORDER — ENOXAPARIN SODIUM 60 MG/0.6ML IJ SOSY
55.0000 mg | PREFILLED_SYRINGE | INTRAMUSCULAR | Status: DC
  Administered 2023-09-01: 55 mg via SUBCUTANEOUS
  Filled 2023-09-01: qty 0.6

## 2023-09-01 MED ORDER — METFORMIN HCL 500 MG PO TABS
1000.0000 mg | ORAL_TABLET | Freq: Two times a day (BID) | ORAL | Status: DC
  Administered 2023-09-01 – 2023-09-02 (×2): 1000 mg via ORAL
  Filled 2023-09-01 (×2): qty 2

## 2023-09-01 MED ORDER — ALPRAZOLAM 0.25 MG PO TABS: 0.2500 mg | ORAL_TABLET | Freq: Two times a day (BID) | ORAL | Status: DC | PRN

## 2023-09-01 NOTE — ED Triage Notes (Signed)
Pt comes with c/o abnormal labs. Pt states he pcp dent her over for some K+ given her labs were low.

## 2023-09-01 NOTE — Progress Notes (Signed)
PHARMACIST - PHYSICIAN COMMUNICATION  CONCERNING:  Enoxaparin (Lovenox) for DVT Prophylaxis    RECOMMENDATION: Patient was prescribed enoxaprin 40mg  q24 hours for VTE prophylaxis.   Filed Weights   09/01/23 1040  Weight: 111.6 kg (246 lb)    Body mass index is 43.58 kg/m.  Estimated Creatinine Clearance: 49.4 mL/min (A) (by C-G formula based on SCr of 1.29 mg/dL (H)).   Based on [facility] policy patient is candidate for enoxaparin 0.5mg /kg TBW SQ every 24 hours based on BMI being >30.  DESCRIPTION: Pharmacy has adjusted enoxaparin dose per [facility] policy.  Patient is now receiving enoxaparin 55 mg every 24 hours    Barrie Folk, PharmD Clinical Pharmacist  09/01/2023 1:37 PM

## 2023-09-01 NOTE — ED Notes (Signed)
MD Paduchowski informed of K+ of 2.1

## 2023-09-01 NOTE — ED Notes (Signed)
Charge RN Puryear informed of level of acuity change due to abnormal lab results.

## 2023-09-01 NOTE — Assessment & Plan Note (Deleted)
Rapidly growing, painful, draining lesion on the back present for several months. Concern for potential malignancy given rapid growth and change in characteristics. Lesion raised and friable in some areas. Not amenable to in-office removal due to size and risk of bleeding. -Refer to Dermatology as soon as possible for evaluation and potential biopsy. Current appointment in January may need to be expedited. -Unable to obtain sample for culture, no drainage today -Clean and dress the lesion in the clinic today. -Consider urgent care if condition worsens before Dermatology appointment.  -Check labs today

## 2023-09-01 NOTE — ED Notes (Signed)
Level acuity changed due to abnormal K+ result of 2.1

## 2023-09-01 NOTE — ED Provider Notes (Signed)
[facility] Provider Note    Event Date/Time   First MD Initiated Contact with Patient 09/01/23 1118     (approximate)  History   Chief Complaint: abnormal labs  HPI  Ramona Weiner is a 67 y.o. female with a past medical history of anxiety, asthma, COPD, diabetes, hypertension, hyperlipidemia, presents to the emergency department for low potassium and generalized weakness.  According to the patient for the past 1 to 2 weeks she has had progressive worsening weakness to the point where she is now having falls including 2 days ago.  Patient went to her doctor had lab work performed and was called back today saying that her potassium was low.  Patient states she is on potassium supplements but recently stopped taking these medications because she started a supplement that she thought included potassium.  Patient denies any diuretics.  Patient's major complaint is generalized fatigue and weakness.  Physical Exam   Triage Vital Signs: ED Triage Vitals  Encounter Vitals Group     BP 09/01/23 1041 122/61     Systolic BP Percentile --      Diastolic BP Percentile --      Pulse Rate 09/01/23 1041 80     Resp 09/01/23 1041 19     Temp 09/01/23 1041 98 F (36.7 C)     Temp src --      SpO2 09/01/23 1041 94 %     Weight 09/01/23 1040 246 lb (111.6 kg)     Height 09/01/23 1040 5\' 3"  (1.6 m)     Head Circumference --      Peak Flow --      Pain Score 09/01/23 1040 0     Pain Loc --      Pain Education --      Exclude from Growth Chart --     Most recent vital signs: Vitals:   09/01/23 1041  BP: 122/61  Pulse: 80  Resp: 19  Temp: 98 F (36.7 C)  SpO2: 94%    General: Awake, no distress.  CV:  Good peripheral perfusion.  Regular rate and rhythm  Resp:  Normal effort.  Equal breath sounds bilaterally.  Abd:  No distention.  Soft, nontender.  No rebound or guarding.  ED Results / Procedures / Treatments   MEDICATIONS ORDERED IN ED: Medications   potassium chloride 10 mEq in 100 mL IVPB (has no administration in time range)  potassium chloride SA (KLOR-CON M) CR tablet 40 mEq (has no administration in time range)   EKG viewed and interpreted by myself shows a sinus rhythm at 71 bpm with a narrow QRS, normal axis, normal intervals, no concerning ST changes.  IMPRESSION / MDM / ASSESSMENT AND PLAN / ED COURSE  I reviewed the triage vital signs and the nursing notes.  Patient's presentation is most consistent with acute presentation with potential threat to life or bodily function.  Patient presents emergency department for generalized weakness and low potassium.  Patient was called stating her potassium is 2.1.  On recheck today potassium is in fact 2.1 again.  The remainder of the chemistry also shows mild hyperglycemia as well as an anion gap of 17.  We will begin with IV hydration and IV potassium and oral potassium repletion.  CBC shows no significant finding.  Patient denies hitting her head headache or LOC.  I have added on a magnesium level as well as an EKG.  Given the patient's significant hypokalemia along with weakness and  a fall 2 days ago I believe the patient will likely require admission to the hospital service for further workup and treatment.  Patient magnesium is normal.  Patient mid to the hospital service for ongoing workup and treatment.  FINAL CLINICAL IMPRESSION(S) / ED DIAGNOSES   weakness hypokalemia  Note:  This document was prepared using Dragon voice recognition software and may include unintentional dictation errors.   Minna Antis, MD 09/01/23 1300

## 2023-09-01 NOTE — H&P (Addendum)
History and Physical    Catherine [name] ZOX:[id number] DOB: 1956-05-08 DOA: 09/01/2023  PCP: Sherlene Shams, MD (Confirm with patient/family/NH records and if not entered, this has to be entered at [facility] point of entry) Patient coming from: Home  I have personally briefly reviewed patient's old medical records in [facility]  Chief Complaint: Feeling weak  HPI: Catherine [name] is a 67 y.o. female with medical history significant of chronic hypokalemia, HTN, IDDM, asthma/COPD with chronic hypoxic respiratory failure on 4 L continuously, anxiety/depression, presented with generalized weakness and hypokalemia.  Patient sent from PCPs office for evaluation of persistent hypokalemia.  In her history, patient does have better chronic hypokalemia and used to be on KCl supplement every day for the last 3+ years however about 1 month ago patient was started on a different multivitamin supplement and patient stopped taking the KCl supplement from then on.  Starting from last week patient started to feel generalized weakness, denied any nauseous vomiting abdominal pain or diarrhea no urinary symptoms.  Patient been taking the same HTN medication including HCTZ, losartan and metoprolol without any changes.  Yesterday, patient went to see PCP who did a outpatient blood work and this morning it showed potassium level of 2.1 and patient was contacted to come to ED. ED Course: Afebrile, nontachycardic blood pressure 120/60, O2 saturation 94% on 4 L.,  Blood work showed potassium 2.1.  Bicarb 31, creatinine 1.2, glucose 222, hemoglobin 11, WBC 12.7.  Patient was given IV and p.o. KCl in the ED.  Review of Systems: As per HPI otherwise 14 point review of systems negative.    Past Medical History:  Diagnosis Date   Anxiety    Asthma    COPD (chronic obstructive pulmonary disease) (HCC)    COPD exacerbation (HCC) 08/10/2011   Exacerbation 12/2011 June 2013 mMRC 2 July PFT Ratio 50%, FEV1 0.91 L, 41%  pred, DLCO 51% pred, TLC 3.39 L 72% pred Exacerbation 04/2012, admitted [facility]   Depression    Diabetes mellitus    Gastritis and duodenitis June 2011   EGD   GERD (gastroesophageal reflux disease)    Hyperglycemia    Hyperlipidemia    Hypertension    Leukocytosis    Obesity (BMI 30-39.9)    S/P cardiac catheterization March 2012   normal coronaries,  due to chest pain Mariah Milling)   Screening for breast cancer Jul 28 2011   normal   Screening for colon cancer June 2011   polyps found, next one due 2014 ([facility]ott)   Tobacco abuse, in remission    quit oct during hospitalization     Past Surgical History:  Procedure Laterality Date   ABDOMINAL HYSTERECTOMY     ABDOMINAL HYSTERECTOMY     BILATERAL OOPHORECTOMY  1995   and TAH.  noncancerous reasons   CARDIAC CATHETERIZATION  11/22/2010   no significant disease     reports that she quit smoking about 12 years ago. Her smoking use included cigarettes. She started smoking about 52 years ago. She has a 40 pack-year smoking history. She has never used smokeless tobacco. She reports that she does not drink alcohol and does not use drugs.  Allergies  Allergen Reactions   Sulfa Antibiotics Swelling    Patient reports caused cellulitis Other reaction(s): Other (See Comments) cellulitis   Hydroxychloroquine     Other reaction(s): Other (See Comments) Gi upset   Oxycodone-Acetaminophen    Percocet [Oxycodone-Acetaminophen]    Vicodin [Hydrocodone-Acetaminophen]    Aspirin Other (See Comments)  Other reaction(s): Blood Disorder   Ibuprofen     Other reaction(s): Blood Disorder   Omnipaque [Iohexol] Itching    Patient states she has had IV contrast multiple times without any reaction. But she said on Jan 07, 2020 after injection she had terrible itching.  They gave her Benadryl which stopped it immediately.  Told Dr. Don Perking this and she elected to give 50 mg IV Benadryl before scan today 01-19-2020.  She had no reaction after injection  today. It wasn't documented on 01-07-2020.    Family History  Problem Relation Age of Onset   Coronary artery disease Mother 37   Heart disease Mother        CABG x5   COPD Mother 12   Breast cancer Mother    Kidney disease Mother    Heart failure Father    Angina Father    Diabetes Father    Depression Father    Stroke Father    Dementia Father    Cancer Sister        breast, lung, brain   Asthma Brother    Asthma Brother    Diabetes Brother    Hypertension Brother      Prior to Admission medications   Medication Sig Start Date End Date Taking? Authorizing Provider  albuterol (VENTOLIN HFA) 108 (90 Base) MCG/ACT inhaler INHALE 1-2 PUFFS BY MOUTH EVERY 6 HOURS AS NEEDED FOR WHEEZE OR SHORTNESS OF BREATH 06/01/23   Sherlene Shams, MD  ALPRAZolam Prudy Feeler) 0.25 MG tablet Take 1 tablet (0.25 mg total) by mouth 2 (two) times daily as needed for anxiety or sleep. 07/08/23   Sherlene Shams, MD  aspirin 81 MG EC tablet Take 81 mg by mouth daily.    [provider]  Budeson-Glycopyrrol-Formoterol (BREZTRI AEROSPHERE) 160-9-4.8 MCG/ACT AERO Inhale 2 puffs into the lungs 2 (two) times daily. 09/14/20   Sherlene Shams, MD  Cholecalciferol (VITAMIN D3) 125 MCG (5000 UT) CAPS Take 1,000 Units by mouth daily.     [provider]  Continuous Glucose Sensor (FREESTYLE LIBRE 3 SENSOR) MISC Place 1 sensor on the skin every 14 days. Use to check glucose continuously 05/18/23   Sherlene Shams, MD  cyclobenzaprine (FLEXERIL) 5 MG tablet TAKE 1 TABLET BY MOUTH THREE TIMES A DAY AS NEEDED FOR MUSCLE SPASM 08/10/23   Sherlene Shams, MD  hydrochlorothiazide (HYDRODIURIL) 25 MG tablet TAKE 1 TABLET (25 MG TOTAL) BY MOUTH DAILY. 06/24/23   Sherlene Shams, MD  insulin aspart (NOVOLOG FLEXPEN) 100 UNIT/ML FlexPen Inject 20 Units into the skin 3 (three) times daily with meals. 06/16/23   Sherlene Shams, MD  insulin degludec (TRESIBA FLEXTOUCH) 200 UNIT/ML FlexTouch Pen Inject 34 Units into the  skin daily. 06/16/23   Sherlene Shams, MD  ipratropium-albuterol (DUONEB) 0.5-2.5 (3) MG/3ML SOLN Take 3 mLs by nebulization every 6 (six) hours as needed. 08/31/23   Dana Allan, MD  lactulose (CHRONULAC) 10 GM/15ML solution TAKE 30 ML EVERY 4 HOURS UNTIL CONSTIPATION IS RELIEVED 08/25/23   Sherlene Shams, MD  losartan (COZAAR) 100 MG tablet Take 1 tablet (100 mg total) by mouth daily. 05/18/23   Sherlene Shams, MD  Melatonin 5 MG TABS Take 5 mg by mouth at bedtime.    [provider]  metFORMIN (GLUCOPHAGE) 1000 MG tablet TAKE 1 TABLET (1,000 MG TOTAL) BY MOUTH TWICE A DAY WITH FOOD 06/30/23   Sherlene Shams, MD  metoprolol succinate (TOPROL-XL) 25 MG 24 hr  tablet TAKE 1 TABLET BY MOUTH EVERY DAY 10/20/22   Sherlene Shams, MD  PARoxetine (PAXIL) 20 MG tablet TAKE 1 TABLET BY MOUTH EVERY DAY IN THE MORNING 06/30/23   Sherlene Shams, MD  Potassium 99 MG TABS Take 99 mg by mouth daily. Reported on 09/20/2015    [provider]  promethazine (PHENERGAN) 12.5 MG tablet TAKE 1 TABLET (12.5 MG TOTAL) BY MOUTH AT BEDTIME AS NEEDED FOR NAUSEA OR VOMITING. 08/10/23   Sherlene Shams, MD  rosuvastatin (CRESTOR) 40 MG tablet TAKE 1 TABLET BY MOUTH EVERY DAY 06/24/23   Sherlene Shams, MD  Semaglutide, 1 MG/DOSE, 4 MG/3ML SOPN Inject 1 mg as directed once a week. 05/18/23   Sherlene Shams, MD    Physical Exam: Vitals:   09/01/23 1040 09/01/23 1041  BP:  122/61  Pulse:  80  Resp:  19  Temp:  98 F (36.7 C)  SpO2:  94%  Weight: 111.6 kg   Height: 5\' 3"  (1.6 m)     Constitutional: NAD, calm, comfortable Vitals:   09/01/23 1040 09/01/23 1041  BP:  122/61  Pulse:  80  Resp:  19  Temp:  98 F (36.7 C)  SpO2:  94%  Weight: 111.6 kg   Height: 5\' 3"  (1.6 m)    Eyes: PERRL, lids and conjunctivae normal ENMT: Mucous membranes are moist. Posterior pharynx clear of any exudate or lesions.Normal dentition.  Neck: normal, supple, no masses, no thyromegaly Respiratory: clear to  auscultation bilaterally, no wheezing, no crackles. Normal respiratory effort. No accessory muscle use.  Cardiovascular: Regular rate and rhythm, no murmurs / rubs / gallops. No extremity edema. 2+ pedal pulses. No carotid bruits.  Abdomen: no tenderness, no masses palpated. No hepatosplenomegaly. Bowel sounds positive.  Musculoskeletal: no clubbing / cyanosis. No joint deformity upper and lower extremities. Good ROM, no contractures. Normal muscle tone.  Skin: no rashes, lesions, ulcers. No induration Neurologic: CN 2-12 grossly intact. Sensation intact, DTR normal. Strength 5/5 in all 4.  Psychiatric: Normal judgment and insight. Alert and oriented x 3. Normal mood.     Labs on Admission: I have personally reviewed following labs and imaging studies  CBC: Recent Labs  Lab 08/31/23 1746 09/01/23 1041  WBC 12.7* 12.7*  NEUTROABS 8.5*  --   HGB 12.0 11.2*  HCT 36.7 35.5*  MCV 86.4 87.7  PLT 331 334   Basic Metabolic Panel: Recent Labs  Lab 08/31/23 1746 09/01/23 1041  NA 134* 134*  K 2.1* 2.1*  CL 86* 86*  CO2 32 31  GLUCOSE 225* 222*  BUN 19 22  CREATININE 1.27* 1.29*  CALCIUM 9.0 9.1  MG  --  2.0  PHOS  --  1.3*   GFR: Estimated Creatinine Clearance: 49.4 mL/min (A) (by C-G formula based on SCr of 1.29 mg/dL (H)). Liver Function Tests: Recent Labs  Lab 08/31/23 1746  AST 51*  ALT 20  ALKPHOS 74  BILITOT 0.7  PROT 8.1  ALBUMIN 3.4*   No results for input(s): "LIPASE", "AMYLASE" in the last 168 hours. No results for input(s): "AMMONIA" in the last 168 hours. Coagulation Profile: No results for input(s): "INR", "PROTIME" in the last 168 hours. Cardiac Enzymes: No results for input(s): "CKTOTAL", "CKMB", "CKMBINDEX", "TROPONINI" in the last 168 hours. BNP (last 3 results) No results for input(s): "PROBNP" in the last 8760 hours. HbA1C: No results for input(s): "HGBA1C" in the last 72 hours. CBG: No results for input(s): "GLUCAP" in the last  168  hours. Lipid Profile: No results for input(s): "CHOL", "HDL", "LDLCALC", "TRIG", "CHOLHDL", "LDLDIRECT" in the last 72 hours. Thyroid Function Tests: No results for input(s): "TSH", "T4TOTAL", "FREET4", "T3FREE", "THYROIDAB" in the last 72 hours. Anemia Panel: No results for input(s): "VITAMINB12", "FOLATE", "FERRITIN", "TIBC", "IRON", "RETICCTPCT" in the last 72 hours. Urine analysis:    Component Value Date/Time   COLORURINE YELLOW (A) 01/07/2020 1115   APPEARANCEUR CLEAR (A) 01/07/2020 1115   LABSPEC 1.011 01/07/2020 1115   PHURINE 6.0 01/07/2020 1115   GLUCOSEU 50 (A) 01/07/2020 1115   HGBUR NEGATIVE 01/07/2020 1115   BILIRUBINUR NEGATIVE 01/07/2020 1115   BILIRUBINUR negative 12/17/2016 1629   KETONESUR 5 (A) 01/07/2020 1115   PROTEINUR NEGATIVE 01/07/2020 1115   UROBILINOGEN 0.2 12/17/2016 1629   NITRITE NEGATIVE 01/07/2020 1115   LEUKOCYTESUR NEGATIVE 01/07/2020 1115    Radiological Exams on Admission: No results found.  EKG: Independently reviewed.  Sinus rhythm, no acute ST changes.  Assessment/Plan Principal Problem:   Hypokalemia  (please populate well all problems here in Problem List. (For example, if patient is on BP meds at home and you resume or decide to hold them, it is a problem that needs to be her. Same for CAD, COPD, HLD and so on)  Severe hypokalemia -With concurrent hypophosphatemia -Continue IV and p.o. KCl replacement, recheck potassium level tonight and tomorrow morning -Magnesium= 2.1. -Plan to avoid HCTZ  Severe hypophosphatemia -Unknown etiology, p.o. replacement, recheck phosphorus level tomorrow -May need close outpatient follow-up with PCP versus nephrology for further workup  Chronic hypoxic respiratory failure Asthma/COPD -Denied any breathing symptoms, oxygen level at baseline -Continue ICS and LABA and as needed albuterol  HTN -Hold off hydrochlorothiazide -Replace HCTZ with amlodipine -Continue metoprolol and  losartan  IDDM -Continue current diabetic regimen Lantus and NovoLog  Morbid obesity -BMI= 43, recommend calorie control  Chronic back wound -PCP Dr. Clent Ridges reports that patient has had the back wound, painful, with a small opening since July this year. Has dermatology appointment set up next Month. \ -Will have wound care team to follow on this issue  DVT prophylaxis: Lovenox Code Status: Full code Family Communication: None at bedside Disposition Plan: Expect less than 2 midnight hospital stay Consults called: None Admission status: Telemetry observation   Emeline General MD [facility] Pager [phone]  09/01/2023, 1:44 PM

## 2023-09-01 NOTE — Assessment & Plan Note (Signed)
Rapidly growing, painful, draining lesion on the back present for several months. Concern for potential malignancy given rapid growth and change in characteristics. Lesion raised and friable in some areas. Not amenable to in-office removal due to size and risk of bleeding. -Refer to Dermatology as soon as possible for evaluation and potential biopsy. Current appointment in January may need to be expedited. -Unable to obtain sample for culture, no drainage today -Clean and dress the lesion in the clinic today. -Consider urgent care if condition worsens before Dermatology appointment.  -Check labs today

## 2023-09-01 NOTE — Assessment & Plan Note (Signed)
Potassium 2.1.  Had stopped taking po supplements.  Notified patient of results.Currently asymptomatic Recommend she go to ED for evaluation and repletion

## 2023-09-02 DIAGNOSIS — R531 Weakness: Secondary | ICD-10-CM | POA: Diagnosis not present

## 2023-09-02 DIAGNOSIS — E876 Hypokalemia: Secondary | ICD-10-CM | POA: Diagnosis not present

## 2023-09-02 LAB — BASIC METABOLIC PANEL
Anion gap: 13 (ref 5–15)
BUN: 22 mg/dL (ref 8–23)
CO2: 32 mmol/L (ref 22–32)
Calcium: 8.8 mg/dL — ABNORMAL LOW (ref 8.9–10.3)
Chloride: 93 mmol/L — ABNORMAL LOW (ref 98–111)
Creatinine, Ser: 1.28 mg/dL — ABNORMAL HIGH (ref 0.44–1.00)
GFR, Estimated: 45 mL/min — ABNORMAL LOW (ref >60)
Glucose, Bld: 155 mg/dL — ABNORMAL HIGH (ref 70–99)
Potassium: 2.7 mmol/L — CL (ref 3.5–5.1)
Sodium: 138 mmol/L (ref 135–145)

## 2023-09-02 LAB — GLUCOSE, CAPILLARY: Glucose-Capillary: 197 mg/dL — ABNORMAL HIGH (ref 70–99)

## 2023-09-02 LAB — RENAL FUNCTION PANEL
Albumin: 3.1 g/dL — ABNORMAL LOW (ref 3.5–5.0)
Anion gap: 13 (ref 5–15)
BUN: 23 mg/dL (ref 8–23)
CO2: 36 mmol/L — ABNORMAL HIGH (ref 22–32)
Calcium: 9.2 mg/dL (ref 8.9–10.3)
Chloride: 89 mmol/L — ABNORMAL LOW (ref 98–111)
Creatinine, Ser: 1.43 mg/dL — ABNORMAL HIGH (ref 0.44–1.00)
GFR, Estimated: 40 mL/min — ABNORMAL LOW (ref >60)
Glucose, Bld: 204 mg/dL — ABNORMAL HIGH (ref 70–99)
Phosphorus: 1.9 mg/dL — ABNORMAL LOW (ref 2.5–4.6)
Potassium: 3 mmol/L — ABNORMAL LOW (ref 3.5–5.1)
Sodium: 138 mmol/L (ref 135–145)

## 2023-09-02 LAB — HIV ANTIBODY (ROUTINE TESTING W REFLEX): HIV Screen 4th Generation wRfx: NONREACTIVE

## 2023-09-02 LAB — PHOSPHORUS: Phosphorus: 2.2 mg/dL — ABNORMAL LOW (ref 2.5–4.6)

## 2023-09-02 MED ORDER — POTASSIUM PHOSPHATE MONOBASIC 500 MG PO TABS
500.0000 mg | ORAL_TABLET | Freq: Once | ORAL | Status: AC
  Administered 2023-09-02: 500 mg via ORAL
  Filled 2023-09-02 (×3): qty 1

## 2023-09-02 MED ORDER — K PHOS MONO-SOD PHOS DI & MONO 155-852-130 MG PO TABS
500.0000 mg | ORAL_TABLET | Freq: Once | ORAL | Status: DC
  Filled 2023-09-02: qty 2

## 2023-09-02 MED ORDER — ORAL CARE MOUTH RINSE: 15.0000 mL | OROMUCOSAL | Status: DC | PRN

## 2023-09-02 MED ORDER — POTASSIUM CHLORIDE CRYS ER 20 MEQ PO TBCR
40.0000 meq | EXTENDED_RELEASE_TABLET | ORAL | Status: AC
  Administered 2023-09-02 (×3): 40 meq via ORAL
  Filled 2023-09-02 (×3): qty 2

## 2023-09-02 MED ORDER — PHOSPHORUS W/SOD & POTASSIUM 280-160-250 MG PO PACK: 1.0000 | PACK | Freq: Two times a day (BID) | ORAL | 0 refills | Status: AC

## 2023-09-02 NOTE — Consult Note (Signed)
WOC team consulted for back wound. Patient seen by primary care 08/31/2023 at which time lesion was described as a ? Malignant mass not a wound. Per that note patient has dermatology evaluation has been expedited to January.  No wound care necessary for this area other than dry dressing/silicone foam.  Patient needs to be sure to keep dermatology evaluation for biopsy of this area.   WOC team will not follow.  Re-consult if further needs arise.   Thank you,     Priscella Mann MSN, RN-BC, [employer] [phone]

## 2023-09-02 NOTE — Progress Notes (Signed)
Received report from lab potassium levels at 2.7. MD Sumayya notified. Pt asymptomatic.

## 2023-09-02 NOTE — Discharge Summary (Signed)
Physician Discharge Summary   Patient: Catherine [name] MRN: [id number] DOB: September 26, 1955  Admit date:     09/01/2023  Discharge date: 09/02/23  Discharge Physician: Arnetha Courser   PCP: Sherlene Shams, MD   Recommendations at discharge:  Please obtain renal function in 2 days. Please replace electrolytes as needed. Patient was instructed to restart her chronic potassium supplement which she was not taking for more than a month. Follow-up with primary care provider within next couple of days  Discharge Diagnoses: Principal Problem:   Hypokalemia Active Problems:   Weakness  Resolved Problems:   * No resolved hospital problems. [facility] Course: Taken from H&P.  Catherine [name] is a 67 y.o. female with medical history significant of chronic hypokalemia, HTN, IDDM, asthma/COPD with chronic hypoxic respiratory failure on 4 L continuously, anxiety/depression, presented with generalized weakness and hypokalemia.   Patient saw her PCP for worsening weakness and found to have potassium of 2.1 and was told to come to ED.  Patient with history of chronic hypokalemia and she was used to take potassium supplement for the past many years, about a month ago she is switched to a different multivitamin and stopped taking potassium supplement.  On presentation she was hemodynamically stable on baseline oxygen of 4 L, labs with potassium of 2.1, magnesium 2.0, phosphorus 1.6, bicarb 31, creatinine 1.2 and WBC of 12.7.  She was started on potassium replacement.  12/25: Vital stable, potassium at 2.7, phosphorus 2.2, rest of the labs seems stable.  Repeat potassium increased to 3.  Patient was asked to restart taking her potassium supplement.  She was also given 2 doses of potassium with phosphorus and she was asked to follow-up with primary care provider in 2 days to have her labs repeated.  She also had mildly increased creatinine from her baseline but not meeting the criteria for AKI.  She  was instructed to keep herself well-hydrated  Patient will continue with rest of her home medications and need to have a close follow-up with her primary care provider for further management.   Consultants: None Procedures performed: None Disposition: Home Diet recommendation:  Discharge Diet Orders (From admission, onward)     Start     Ordered   09/02/23 0000  Diet - low sodium heart healthy        09/02/23 1434           Cardiac and Carb modified diet DISCHARGE MEDICATION: Allergies as of 09/02/2023       Reactions   Sulfa Antibiotics Swelling   Patient reports caused cellulitis Other reaction(s): Other (See Comments) cellulitis   Hydroxychloroquine    Other reaction(s): Other (See Comments) Gi upset   Oxycodone-acetaminophen    Percocet [oxycodone-acetaminophen]    Vicodin [hydrocodone-acetaminophen]    Aspirin Other (See Comments)   Other reaction(s): Blood Disorder   Ibuprofen    Other reaction(s): Blood Disorder   Omnipaque [iohexol] Itching   Patient states she has had IV contrast multiple times without any reaction. But she said on Jan 07, 2020 after injection she had terrible itching.  They gave her Benadryl which stopped it immediately.  Told Dr. Don Perking this and she elected to give 50 mg IV Benadryl before scan today 01-19-2020.  She had no reaction after injection today. It wasn't documented on 01-07-2020.        Medication List     STOP taking these medications    hydrochlorothiazide 25 MG tablet Commonly known as: HYDRODIURIL  TAKE these medications    albuterol 108 (90 Base) MCG/ACT inhaler Commonly known as: VENTOLIN HFA INHALE 1-2 PUFFS BY MOUTH EVERY 6 HOURS AS NEEDED FOR WHEEZE OR SHORTNESS OF BREATH   ALPRAZolam 0.25 MG tablet Commonly known as: XANAX Take 1 tablet (0.25 mg total) by mouth 2 (two) times daily as needed for anxiety or sleep.   aspirin EC 81 MG tablet Take 81 mg by mouth daily.   Breztri Aerosphere 160-9-4.8  MCG/ACT Aero Generic drug: Budeson-Glycopyrrol-Formoterol Inhale 2 puffs into the lungs 2 (two) times daily.   cyclobenzaprine 5 MG tablet Commonly known as: FLEXERIL TAKE 1 TABLET BY MOUTH THREE TIMES A DAY AS NEEDED FOR MUSCLE SPASM   FreeStyle Libre 3 Sensor Misc Place 1 sensor on the skin every 14 days. Use to check glucose continuously   ipratropium-albuterol 0.5-2.5 (3) MG/3ML Soln Commonly known as: DUONEB Take 3 mLs by nebulization every 6 (six) hours as needed.   lactulose 10 GM/15ML solution Commonly known as: CHRONULAC TAKE 30 ML EVERY 4 HOURS UNTIL CONSTIPATION IS RELIEVED   losartan 100 MG tablet Commonly known as: COZAAR Take 1 tablet (100 mg total) by mouth daily.   melatonin 5 MG Tabs Take 5 mg by mouth at bedtime.   metFORMIN 1000 MG tablet Commonly known as: GLUCOPHAGE TAKE 1 TABLET (1,000 MG TOTAL) BY MOUTH TWICE A DAY WITH FOOD   metoprolol succinate 25 MG 24 hr tablet Commonly known as: TOPROL-XL TAKE 1 TABLET BY MOUTH EVERY DAY   NovoLOG FlexPen 100 UNIT/ML FlexPen Generic drug: insulin aspart Inject 20 Units into the skin 3 (three) times daily with meals. What changed: additional instructions   PARoxetine 20 MG tablet Commonly known as: PAXIL TAKE 1 TABLET BY MOUTH EVERY DAY IN THE MORNING   Phosphorus w/Sod & Potassium 280-160-250 MG Pack Take 1 Package by mouth in the morning and at bedtime for 2 doses.   Potassium 99 MG Tabs Take 99 mg by mouth daily. Reported on 09/20/2015   promethazine 12.5 MG tablet Commonly known as: PHENERGAN TAKE 1 TABLET (12.5 MG TOTAL) BY MOUTH AT BEDTIME AS NEEDED FOR NAUSEA OR VOMITING.   rosuvastatin 40 MG tablet Commonly known as: CRESTOR TAKE 1 TABLET BY MOUTH EVERY DAY   Semaglutide (1 MG/DOSE) 4 MG/3ML Sopn Inject 1 mg as directed once a week.   Evaristo Bury FlexTouch 200 UNIT/ML FlexTouch Pen Generic drug: insulin degludec Inject 34 Units into the skin daily. What changed: how much to take   Vitamin  D3 125 MCG (5000 UT) Caps Take 1,000 Units by mouth daily.        Follow-up Information     Sherlene Shams, MD. Schedule an appointment as soon as possible for a visit in 2 day(s).   Specialty: Internal Medicine Contact information: [address] [place] [address] [phone]                Discharge Exam: Ceasar Mons Weights   09/01/23 1040  Weight: 111.6 kg   General.  Morbidly obese lady, in no acute distress. Pulmonary.  Lungs clear bilaterally, normal respiratory effort. CV.  Regular rate and rhythm, no JVD, rub or murmur. Abdomen.  Soft, nontender, nondistended, BS positive. CNS.  Alert and oriented .  No focal neurologic deficit. Extremities.  No edema, no cyanosis, pulses intact and symmetrical. Psychiatry.  Judgment and insight appears normal.   Condition at discharge: stable  The results of significant diagnostics from this hospitalization (including imaging, microbiology, ancillary and  laboratory) are listed below for reference.   Imaging Studies: DG Chest 1 View Result Date: 09/01/2023 CLINICAL DATA:  Hypokalemia. EXAM: CHEST  1 VIEW COMPARISON:  June 19, 2022. FINDINGS: The heart size and mediastinal contours are within normal limits. Both lungs are clear. The visualized skeletal structures are unremarkable. IMPRESSION: No active disease. Electronically Signed   By: Lupita Raider M.D.   On: 09/01/2023 14:36    Microbiology: Results for orders placed or performed during the hospital encounter of 01/19/20  SARS Coronavirus 2 by RT PCR (hospital order, performed in [facility] hospital lab) Nasopharyngeal Nasopharyngeal Swab     Status: None   Collection Time: 01/19/20  6:43 AM   Specimen: Nasopharyngeal Swab  Result Value Ref Range Status   SARS Coronavirus 2 NEGATIVE NEGATIVE Final    Comment: (NOTE) SARS-CoV-2 target nucleic acids are NOT DETECTED. The SARS-CoV-2 RNA is generally detectable in upper and lower respiratory  specimens during the acute phase of infection. The lowest concentration of SARS-CoV-2 viral copies this assay can detect is 250 copies / mL. A negative result does not preclude SARS-CoV-2 infection and should not be used as the sole basis for treatment or other patient management decisions.  A negative result may occur with improper specimen collection / handling, submission of specimen other than nasopharyngeal swab, presence of viral mutation(s) within the areas targeted by this assay, and inadequate number of viral copies (<250 copies / mL). A negative result must be combined with clinical observations, patient history, and epidemiological information. Fact Sheet for Patients:   BoilerBrush.com.cy Fact Sheet for Healthcare Providers: https://pope.com/ This test is not yet approved or cleared  by the [place] FDA and has been authorized for detection and/or diagnosis of SARS-CoV-2 by FDA under an Emergency Use Authorization (EUA).  This EUA will remain in effect (meaning this test can be used) for the duration of the COVID-19 declaration under Section 564(b)(1) of the Act, 21 U.S.C. section 360bbb-3(b)(1), unless the authorization is terminated or revoked sooner. Performed at [facility], [address] [place], [place] [address]     Labs: CBC: Recent Labs  Lab 08/31/23 1746 09/01/23 1041  WBC 12.7* 12.7*  NEUTROABS 8.5*  --   HGB 12.0 11.2*  HCT 36.7 35.5*  MCV 86.4 87.7  PLT 331 334   Basic Metabolic Panel: Recent Labs  Lab 08/31/23 1746 09/01/23 1041 09/01/23 2041 09/02/23 0643 09/02/23 1230  NA 134* 134*  --  138 138  K 2.1* 2.1* 2.7* 2.7* 3.0*  CL 86* 86*  --  93* 89*  CO2 32 31  --  32 36*  GLUCOSE 225* 222*  --  155* 204*  BUN 19 22  --  22 23  CREATININE 1.27* 1.29*  --  1.28* 1.43*  CALCIUM 9.0 9.1  --  8.8* 9.2  MG  --  2.0  --   --   --   PHOS  --  1.3*  --  2.2* 1.9*   Liver  Function Tests: Recent Labs  Lab 08/31/23 1746 09/02/23 1230  AST 51*  --   ALT 20  --   ALKPHOS 74  --   BILITOT 0.7  --   PROT 8.1  --   ALBUMIN 3.4* 3.1*   CBG: Recent Labs  Lab 09/01/23 1711 09/01/23 1852 09/01/23 2153 09/02/23 1148  GLUCAP 298* 249* 229* 197*    Discharge time spent: greater than 30 minutes.  This record has been created using [employer]  voice recognition software. Errors have been sought and corrected,but may not always be located. Such creation errors do not reflect on the standard of care.   Signed: Arnetha Courser, MD [facility] 09/02/2023

## 2023-09-02 NOTE — Plan of Care (Signed)
  Problem: Education: Goal: Ability to describe self-care measures that may prevent or decrease complications (Diabetes Survival Skills Education) will improve Outcome: Progressing Goal: Individualized Educational Video(s) Outcome: Progressing   

## 2023-09-02 NOTE — Hospital Course (Addendum)
Taken from H&P.  Catherine [name] is a 67 y.o. female with medical history significant of chronic hypokalemia, HTN, IDDM, asthma/COPD with chronic hypoxic respiratory failure on 4 L continuously, anxiety/depression, presented with generalized weakness and hypokalemia.   Patient saw her PCP for worsening weakness and found to have potassium of 2.1 and was told to come to ED.  Patient with history of chronic hypokalemia and she was used to take potassium supplement for the past many years, about a month ago she is switched to a different multivitamin and stopped taking potassium supplement.  On presentation she was hemodynamically stable on baseline oxygen of 4 L, labs with potassium of 2.1, magnesium 2.0, phosphorus 1.6, bicarb 31, creatinine 1.2 and WBC of 12.7.  She was started on potassium replacement.  12/25: Vital stable, potassium at 2.7, phosphorus 2.2, rest of the labs seems stable.  Repeat potassium increased to 3.  Patient was asked to restart taking her potassium supplement.  She was also given 2 doses of potassium with phosphorus and she was asked to follow-up with primary care provider in 2 days to have her labs repeated.  She also had mildly increased creatinine from her baseline but not meeting the criteria for AKI.  She was instructed to keep herself well-hydrated  Patient will continue with rest of her home medications and need to have a close follow-up with her primary care provider for further management.

## 2023-09-02 NOTE — Plan of Care (Signed)
Patient arrived from ED alert and oriented. Dyspnea with exertion after patient ambulated to bed from stretcher. Continues to have 4l O2 via nasal canula. Patient settled into room, assessment completed. Will continue to monitor.   Problem: Education: Goal: Ability to describe self-care measures that may prevent or decrease complications (Diabetes Survival Skills Education) will improve Outcome: Progressing Goal: Individualized Educational Video(s) Outcome: Progressing   Problem: Coping: Goal: Ability to adjust to condition or change in health will improve Outcome: Progressing   Problem: Fluid Volume: Goal: Ability to maintain a balanced intake and output will improve Outcome: Progressing   Problem: Health Behavior/Discharge Planning: Goal: Ability to identify and utilize available resources and services will improve Outcome: Progressing Goal: Ability to manage health-related needs will improve Outcome: Progressing   Problem: Metabolic: Goal: Ability to maintain appropriate glucose levels will improve Outcome: Progressing   Problem: Nutritional: Goal: Maintenance of adequate nutrition will improve Outcome: Progressing Goal: Progress toward achieving an optimal weight will improve Outcome: Progressing   Problem: Skin Integrity: Goal: Risk for impaired skin integrity will decrease Outcome: Progressing   Problem: Tissue Perfusion: Goal: Adequacy of tissue perfusion will improve Outcome: Progressing   Problem: Education: Goal: Knowledge of General Education information will improve Description: Including pain rating scale, medication(s)/side effects and non-pharmacologic comfort measures Outcome: Progressing   Problem: Health Behavior/Discharge Planning: Goal: Ability to manage health-related needs will improve Outcome: Progressing   Problem: Clinical Measurements: Goal: Ability to maintain clinical measurements within normal limits will improve Outcome:  Progressing Goal: Will remain free from infection Outcome: Progressing Goal: Diagnostic test results will improve Outcome: Progressing Goal: Respiratory complications will improve Outcome: Progressing Goal: Cardiovascular complication will be avoided Outcome: Progressing   Problem: Activity: Goal: Risk for activity intolerance will decrease Outcome: Progressing   Problem: Nutrition: Goal: Adequate nutrition will be maintained Outcome: Progressing   Problem: Coping: Goal: Level of anxiety will decrease Outcome: Progressing   Problem: Elimination: Goal: Will not experience complications related to bowel motility Outcome: Progressing Goal: Will not experience complications related to urinary retention Outcome: Progressing   Problem: Pain Management: Goal: General experience of comfort will improve Outcome: Progressing   Problem: Safety: Goal: Ability to remain free from injury will improve Outcome: Progressing   Problem: Skin Integrity: Goal: Risk for impaired skin integrity will decrease Outcome: Progressing

## 2023-09-03 ENCOUNTER — Telehealth: Payer: Self-pay

## 2023-09-03 NOTE — Transitions of Care (Post Inpatient/ED Visit) (Signed)
 09/03/2023  Name: Catherine [name] MRN: [id number] DOB: 1956-02-19  Today's TOC FU Call Status: Today's TOC FU Call Status:: Successful TOC FU Call Completed TOC FU Call Complete Date: 09/03/23 Patient's Name and Date of Birth confirmed.  Transition Care Management Follow-up Telephone Call Date of Discharge: 09/02/23 Discharge Facility: [facility]) Type of Discharge: Emergency Department Reason for ED Visit: Other: (hypokalemia) How have you been since you were released from the hospital?: Better Any questions or concerns?: No  Items Reviewed: Did you receive and understand the discharge instructions provided?: Yes Medications obtained,verified, and reconciled?: Yes (Medications Reviewed) Any new allergies since your discharge?: No Dietary orders reviewed?: Yes Type of Diet Ordered:: heart healthy Do you have support at home?: Yes People in Home: child(ren), adult  Medications Reviewed Today: Medications Reviewed Today     Reviewed by Khaza Blansett, Jordan Hawks, CMA (Certified Medical Assistant) on 09/03/23 at 1515  Med List Status: <None>   Medication Order Taking? Sig Documenting Provider Last Dose Status Informant  albuterol (VENTOLIN HFA) 108 (90 Base) MCG/ACT inhaler [id number] No INHALE 1-2 PUFFS BY MOUTH EVERY 6 HOURS AS NEEDED FOR WHEEZE OR SHORTNESS OF Barnabas Lister, MD Taking Active Self           Med Note [facility], TIFFANY A   Tue Sep 01, 2023  3:55 PM) prn  ALPRAZolam (XANAX) 0.25 MG tablet [id number] No Take 1 tablet (0.25 mg total) by mouth 2 (two) times daily as needed for anxiety or sleep. Sherlene Shams, MD Taking Active Self           Med Note [facility], TIFFANY A   Tue Sep 01, 2023  3:55 PM) prn  aspirin 81 MG EC tablet [id number] No Take 81 mg by mouth daily. [provider] 09/01/2023 Morning Active Self  Budeson-Glycopyrrol-Formoterol (BREZTRI AEROSPHERE) 160-9-4.8 MCG/ACT AERO [id number] No Inhale 2 puffs into the lungs 2  (two) times daily. Sherlene Shams, MD 09/01/2023 Morning Active Self           Med Note [facility], CATHERINE T   Mon Aug 18, 2022  2:05 PM)    Cholecalciferol (VITAMIN D3) 125 MCG (5000 UT) CAPS [id number] No Take 1,000 Units by mouth daily.  [provider] 08/31/2023 Evening Active Self  Continuous Glucose Sensor (FREESTYLE LIBRE 3 SENSOR) MISC [id number] No Place 1 sensor on the skin every 14 days. Use to check glucose continuously Sherlene Shams, MD Taking Active Self  cyclobenzaprine (FLEXERIL) 5 MG tablet [id number] No TAKE 1 TABLET BY MOUTH THREE TIMES A DAY AS NEEDED FOR MUSCLE SPASM Sherlene Shams, MD Taking Active Self           Med Note [facility], TIFFANY A   Tue Sep 01, 2023  3:55 PM) prn  insulin aspart (NOVOLOG FLEXPEN) 100 UNIT/ML FlexPen [id number] No Inject 20 Units into the skin 3 (three) times daily with meals.  Patient taking differently: Inject 20 Units into the skin 3 (three) times daily with meals. 15 units in the morning, 25 units in the afternoon, 25 units in the evening   Sherlene Shams, MD 09/01/2023  6:30 AM Active Self           Med Note ([facility], TIFFANY A   Tue Sep 01, 2023  3:57 PM)    insulin degludec (TRESIBA FLEXTOUCH) 200 UNIT/ML FlexTouch Pen [id number] No Inject 34 Units into the skin daily.  Patient taking differently: Inject 36 Units into the skin daily.  Sherlene Shams, MD 08/31/2023  6:30 AM Active Self           Med Note [facility], TIFFANY A   Tue Sep 01, 2023  3:57 PM)    ipratropium-albuterol (DUONEB) 0.5-2.5 (3) MG/3ML SOLN [id number]  Take 3 mLs by nebulization every 6 (six) hours as needed. Dana Allan, MD  Active Self           Med Note [facility], [facility] A   Tue Sep 01, 2023  3:57 PM) prn  lactulose (CHRONULAC) 10 GM/15ML solution [id number] No TAKE 30 ML EVERY 4 HOURS UNTIL CONSTIPATION IS RELIEVED Sherlene Shams, MD Taking Active Self           Med Note [facility], TIFFANY A   Tue Sep 01, 2023  3:57 PM) prn  losartan (COZAAR) 100 MG tablet  [id number] No Take 1 tablet (100 mg total) by mouth daily. Sherlene Shams, MD 09/01/2023 Morning Active Self  Melatonin 5 MG TABS [id number] No Take 5 mg by mouth at bedtime.  Patient not taking: Reported on 09/01/2023   [provider] Not Taking Active Self  metFORMIN (GLUCOPHAGE) 1000 MG tablet [id number] No TAKE 1 TABLET (1,000 MG TOTAL) BY MOUTH TWICE A DAY WITH FOOD Sherlene Shams, MD 09/01/2023 Morning Active Self  metoprolol succinate (TOPROL-XL) 25 MG 24 hr tablet [id number] No TAKE 1 TABLET BY MOUTH EVERY DAY Sherlene Shams, MD 09/01/2023 Active Self  PARoxetine (PAXIL) 20 MG tablet [id number] No TAKE 1 TABLET BY MOUTH EVERY DAY IN THE MORNING Sherlene Shams, MD 09/01/2023 Morning Active Self  Potassium & Sodium Phosphates (PHOSPHORUS W/SOD & POTASSIUM) 280-160-250 MG PACK [id number]  Take 1 Package by mouth in the morning and at bedtime for 2 doses. Arnetha Courser, MD  Active   Potassium 99 MG TABS [id number] No Take 99 mg by mouth daily. Reported on 09/20/2015  Patient not taking: Reported on 09/01/2023   [provider] Not Taking Active Self           Med Note ([place]UL[facility], ASHLEY L   Thu Dec 06, 2015 10:41 AM)    promethazine (PHENERGAN) 12.5 MG tablet [id number] No TAKE 1 TABLET (12.5 MG TOTAL) BY MOUTH AT BEDTIME AS NEEDED FOR NAUSEA OR VOMITING. Sherlene Shams, MD Taking Active Self           Med Note [facility], [facility] A   Tue Sep 01, 2023  3:58 PM) prn  rosuvastatin (CRESTOR) 40 MG tablet [id number] No TAKE 1 TABLET BY MOUTH EVERY DAY Sherlene Shams, MD 09/01/2023 Morning Active Self  Semaglutide, 1 MG/DOSE, 4 MG/3ML SOPN [id number] No Inject 1 mg as directed once a week. Sherlene Shams, MD 08/30/2023 Active Self           Med Note [facility], TIFFANY A   Tue Sep 01, 2023  3:58 PM) Felizardo Hoffmann on Sundays            Home Care and Equipment/Supplies: Were Home Health Services Ordered?: NA Any new equipment or medical supplies ordered?: NA  Functional  Questionnaire: Do you need assistance with bathing/showering or dressing?: No Do you need assistance with meal preparation?: No Do you need assistance with eating?: No Do you have difficulty maintaining continence: No Do you need assistance with getting out of bed/getting out of a chair/moving?: No Do you have difficulty managing or taking your medications?: No  Follow up appointments reviewed: PCP Follow-up appointment confirmed?: Yes Date of PCP follow-up appointment?: 09/04/23 Follow-up  Provider: [facility] Follow-up appointment confirmed?: NA Do you need transportation to your follow-up appointment?: No Do you understand care options if your condition(s) worsen?: Yes-patient verbalized understanding     Scottie Stanish, CMA  [facility] AWV Team Direct Dial: [phone]

## 2023-09-04 ENCOUNTER — Encounter: Payer: Self-pay | Admitting: Nurse Practitioner

## 2023-09-04 ENCOUNTER — Ambulatory Visit (INDEPENDENT_AMBULATORY_CARE_PROVIDER_SITE_OTHER): Payer: Medicare HMO | Admitting: Nurse Practitioner

## 2023-09-04 VITALS — BP 138/70 | HR 97 | Ht 63.0 in | Wt 252.0 lb

## 2023-09-04 DIAGNOSIS — N289 Disorder of kidney and ureter, unspecified: Secondary | ICD-10-CM | POA: Diagnosis not present

## 2023-09-04 DIAGNOSIS — E876 Hypokalemia: Secondary | ICD-10-CM

## 2023-09-04 DIAGNOSIS — Z09 Encounter for follow-up examination after completed treatment for conditions other than malignant neoplasm: Secondary | ICD-10-CM

## 2023-09-04 LAB — COMPREHENSIVE METABOLIC PANEL
ALT: 16 U/L (ref 0–35)
AST: 41 U/L — ABNORMAL HIGH (ref 0–37)
Albumin: 3.3 g/dL — ABNORMAL LOW (ref 3.5–5.2)
Alkaline Phosphatase: 71 U/L (ref 39–117)
BUN: 19 mg/dL (ref 6–23)
CO2: 33 meq/L — ABNORMAL HIGH (ref 19–32)
Calcium: 9.7 mg/dL (ref 8.4–10.5)
Chloride: 99 meq/L (ref 96–112)
Creatinine, Ser: 1.26 mg/dL — ABNORMAL HIGH (ref 0.40–1.20)
GFR: 43.69 mL/min — ABNORMAL LOW (ref >60.00)
Glucose, Bld: 184 mg/dL — ABNORMAL HIGH (ref 70–99)
Potassium: 3.3 meq/L — ABNORMAL LOW (ref 3.5–5.1)
Sodium: 142 meq/L (ref 135–145)
Total Bilirubin: 0.3 mg/dL (ref 0.2–1.2)
Total Protein: 7 g/dL (ref 6.0–8.3)

## 2023-09-04 NOTE — Patient Instructions (Signed)
Please go to the lab for blood work. Continue to take the potassium supplement daily.

## 2023-09-04 NOTE — Telephone Encounter (Signed)
PAP: Patient assistance application for Novolog, Ozempic, and Evaristo Bury has been approved by PAP Companies: Novo[employer]ord[employer] from 09/09/2023 to 09/07/2024. Medication should be delivered to PAP Delivery: Provider's office For further shipping updates, please contact Novo Nordisk at [phone] Pt ID is: [id number]

## 2023-09-04 NOTE — Progress Notes (Unsigned)
Established Patient Office Visit  Subjective:  Patient ID: Catherine [name], female    DOB: 1956-02-28  Age: 67 y.o. MRN: [id number]  CC:  Chief Complaint  Patient presents with   Hospitalization Follow-up    hypokalemia    HPI  Catherine [name] presents for:  HPI   Past Medical History:  Diagnosis Date   Anxiety    Asthma    COPD (chronic obstructive pulmonary disease) (HCC)    COPD exacerbation (HCC) 08/10/2011   Exacerbation 12/2011 June 2013 mMRC 2 July PFT Ratio 50%, FEV1 0.91 L, 41% pred, DLCO 51% pred, TLC 3.39 L 72% pred Exacerbation 04/2012, admitted [facility]   Depression    Diabetes mellitus    Gastritis and duodenitis June 2011   EGD   GERD (gastroesophageal reflux disease)    Hyperglycemia    Hyperlipidemia    Hypertension    Leukocytosis    Obesity (BMI 30-39.9)    S/P cardiac catheterization March 2012   normal coronaries,  due to chest pain Mariah Milling)   Screening for breast cancer Jul 28 2011   normal   Screening for colon cancer June 2011   polyps found, next one due 2014 ([facility])   Tobacco abuse, in remission    quit oct during hospitalization     Past Surgical History:  Procedure Laterality Date   ABDOMINAL HYSTERECTOMY     ABDOMINAL HYSTERECTOMY     BILATERAL OOPHORECTOMY  1995   and TAH.  noncancerous reasons   CARDIAC CATHETERIZATION  11/22/2010   no significant disease    Family History  Problem Relation Age of Onset   Coronary artery disease Mother 71   Heart disease Mother        CABG x5   COPD Mother 51   Breast cancer Mother    Kidney disease Mother    Heart failure Father    Angina Father    Diabetes Father    Depression Father    Stroke Father    Dementia Father    Cancer Sister        breast, lung, brain   Asthma Brother    Asthma Brother    Diabetes Brother    Hypertension Brother     Social History   Socioeconomic History   Marital status: Married    Spouse name: Not on file   Number of children: 4   Years  of education: Not on file   Highest education level: Not on file  Occupational History   Occupation: Producer, television/film/video: [employer]  Tobacco Use   Smoking status: Former    Current packs/day: 0.00    Average packs/day: 1 pack/day for 40.0 years (40.0 ttl pk-yrs)    Types: Cigarettes    Start date: 06/27/1971    Quit date: 06/27/2011    Years since quitting: 12.1   Smokeless tobacco: Never  Vaping Use   Vaping status: Never Used  Substance and Sexual Activity   Alcohol use: No   Drug use: No   Sexual activity: Never  Other Topics Concern   Not on file  Social History Narrative   Not on file   Social Drivers of Health   Financial Resource Strain: Low Risk  (10/27/2022)   Overall Financial Resource Strain (CARDIA)    Difficulty of Paying Living Expenses: Not very hard  Food Insecurity: No Food Insecurity (09/02/2023)   Hunger Vital Sign    Worried About Running Out of Food in the Last Year: Never true  Ran Out of Food in the Last Year: Never true  Transportation Needs: No Transportation Needs (09/02/2023)   PRAPARE - Administrator, Civil Service (Medical): No    Lack of Transportation (Non-Medical): No  Physical Activity: Insufficiently Active (10/27/2022)   Exercise Vital Sign    Days of Exercise per Week: 3 days    Minutes of Exercise per Session: 10 min  Stress: No Stress Concern Present (10/27/2022)   [employer] of Occupational Health - Occupational Stress Questionnaire    Feeling of Stress : Not at all  Social Connections: Unknown (10/27/2022)   Social Connection and Isolation Panel [NHANES]    Frequency of Communication with Friends and Family: Not on file    Frequency of Social Gatherings with Friends and Family: Not on file    Attends Religious Services: Not on file    Active Member of Clubs or Organizations: Not on file    Attends Banker Meetings: Not on file    Marital Status: Married  Intimate Partner Violence: Not At Risk  (09/02/2023)   Humiliation, Afraid, Rape, and Kick questionnaire    Fear of Current or Ex-Partner: No    Emotionally Abused: No    Physically Abused: No    Sexually Abused: No     Outpatient Medications Prior to Visit  Medication Sig Dispense Refill   albuterol (VENTOLIN HFA) 108 (90 Base) MCG/ACT inhaler INHALE 1-2 PUFFS BY MOUTH EVERY 6 HOURS AS NEEDED FOR WHEEZE OR SHORTNESS OF BREATH 18 each 3   ALPRAZolam (XANAX) 0.25 MG tablet Take 1 tablet (0.25 mg total) by mouth 2 (two) times daily as needed for anxiety or sleep. 45 tablet 5   aspirin 81 MG EC tablet Take 81 mg by mouth daily.     Budeson-Glycopyrrol-Formoterol (BREZTRI AEROSPHERE) 160-9-4.8 MCG/ACT AERO Inhale 2 puffs into the lungs 2 (two) times daily. 32.1 g 3   Cholecalciferol (VITAMIN D3) 125 MCG (5000 UT) CAPS Take 1,000 Units by mouth daily.      Continuous Glucose Sensor (FREESTYLE LIBRE 3 SENSOR) MISC Place 1 sensor on the skin every 14 days. Use to check glucose continuously     cyclobenzaprine (FLEXERIL) 5 MG tablet TAKE 1 TABLET BY MOUTH THREE TIMES A DAY AS NEEDED FOR MUSCLE SPASM 90 tablet 1   insulin aspart (NOVOLOG FLEXPEN) 100 UNIT/ML FlexPen Inject 20 Units into the skin 3 (three) times daily with meals. (Patient taking differently: Inject 20 Units into the skin 3 (three) times daily with meals. 15 units in the morning, 25 units in the afternoon, 25 units in the evening)     insulin degludec (TRESIBA FLEXTOUCH) 200 UNIT/ML FlexTouch Pen Inject 34 Units into the skin daily. (Patient taking differently: Inject 36 Units into the skin daily.)     ipratropium-albuterol (DUONEB) 0.5-2.5 (3) MG/3ML SOLN Take 3 mLs by nebulization every 6 (six) hours as needed. 360 mL 3   lactulose (CHRONULAC) 10 GM/15ML solution TAKE 30 ML EVERY 4 HOURS UNTIL CONSTIPATION IS RELIEVED 473 mL 1   losartan (COZAAR) 100 MG tablet Take 1 tablet (100 mg total) by mouth daily. 90 tablet 1   Melatonin 5 MG TABS Take 5 mg by mouth at bedtime.      metFORMIN (GLUCOPHAGE) 1000 MG tablet TAKE 1 TABLET (1,000 MG TOTAL) BY MOUTH TWICE A DAY WITH FOOD 180 tablet 1   metoprolol succinate (TOPROL-XL) 25 MG 24 hr tablet TAKE 1 TABLET BY MOUTH EVERY DAY 90 tablet 3   PARoxetine (  PAXIL) 20 MG tablet TAKE 1 TABLET BY MOUTH EVERY DAY IN THE MORNING 90 tablet 1   Potassium 99 MG TABS Take 99 mg by mouth daily. Reported on 09/20/2015     promethazine (PHENERGAN) 12.5 MG tablet TAKE 1 TABLET (12.5 MG TOTAL) BY MOUTH AT BEDTIME AS NEEDED FOR NAUSEA OR VOMITING. 30 tablet 0   rosuvastatin (CRESTOR) 40 MG tablet TAKE 1 TABLET BY MOUTH EVERY DAY 90 tablet 3   Semaglutide, 1 MG/DOSE, 4 MG/3ML SOPN Inject 1 mg as directed once a week.     No facility-administered medications prior to visit.    Allergies  Allergen Reactions   Sulfa Antibiotics Swelling    Patient reports caused cellulitis Other reaction(s): Other (See Comments) cellulitis   Hydroxychloroquine     Other reaction(s): Other (See Comments) Gi upset   Oxycodone-Acetaminophen    Percocet [Oxycodone-Acetaminophen]    Vicodin [Hydrocodone-Acetaminophen]    Aspirin Other (See Comments)    Other reaction(s): Blood Disorder   Ibuprofen     Other reaction(s): Blood Disorder   Omnipaque [Iohexol] Itching    Patient states she has had IV contrast multiple times without any reaction. But she said on Jan 07, 2020 after injection she had terrible itching.  They gave her Benadryl which stopped it immediately.  Told Dr. Don Perking this and she elected to give 50 mg IV Benadryl before scan today 01-19-2020.  She had no reaction after injection today. It wasn't documented on 01-07-2020.    ROS Review of Systems Negative unless indicated in HPI.    Objective:    Physical Exam  BP 138/70   Pulse 97   Ht 5\' 3"  (1.6 m)   Wt 252 lb (114.3 kg)   SpO2 92%   BMI 44.64 kg/m  Wt Readings from Last 3 Encounters:  09/04/23 252 lb (114.3 kg)  09/01/23 246 lb (111.6 kg)  08/31/23 246 lb (111.6 kg)      Health Maintenance  Topic Date Due   COVID-19 Vaccine (1) Never done   Zoster Vaccines- Shingrix (1 of 2) Never done   DTaP/Tdap/Td (2 - Td or Tdap) 12/23/2015   OPHTHALMOLOGY EXAM  01/29/2017   Pneumonia Vaccine 22+ Years old (3 of 3 - PPSV23 or PCV20) 07/31/2019   DEXA SCAN  Never done   Lung Cancer Screening  01/18/2021   FOOT EXAM  12/13/2021   Medicare Annual Wellness (AWV)  10/28/2023   MAMMOGRAM  07/07/2024 (Originally 08/19/2018)   Colonoscopy  07/07/2024 (Originally 09/25/2020)   Diabetic kidney evaluation - Urine ACR  11/24/2023   HEMOGLOBIN A1C  01/04/2024   Diabetic kidney evaluation - eGFR measurement  09/01/2024   INFLUENZA VACCINE  Completed   Hepatitis C Screening  Completed   HPV VACCINES  Aged Out    There are no preventive care reminders to display for this patient.  Lab Results  Component Value Date   TSH 1.10 01/12/2020   Lab Results  Component Value Date   WBC 12.7 (H) 09/01/2023   HGB 11.2 (L) 09/01/2023   HCT 35.5 (L) 09/01/2023   MCV 87.7 09/01/2023   PLT 334 09/01/2023   Lab Results  Component Value Date   NA 138 09/02/2023   K 3.0 (L) 09/02/2023   CO2 36 (H) 09/02/2023   GLUCOSE 204 (H) 09/02/2023   BUN 23 09/02/2023   CREATININE 1.43 (H) 09/02/2023   BILITOT 0.7 08/31/2023   ALKPHOS 74 08/31/2023   AST 51 (H) 08/31/2023   ALT 20 08/31/2023  PROT 8.1 08/31/2023   ALBUMIN 3.1 (L) 09/02/2023   CALCIUM 9.2 09/02/2023   ANIONGAP 13 09/02/2023   GFR 52.04 (L) 07/06/2023   Lab Results  Component Value Date   CHOL 116 07/06/2023   Lab Results  Component Value Date   HDL 37.60 (L) 07/06/2023   Lab Results  Component Value Date   LDLCALC 45 07/06/2023   Lab Results  Component Value Date   TRIG 170.0 (H) 07/06/2023   Lab Results  Component Value Date   CHOLHDL 3 07/06/2023   Lab Results  Component Value Date   HGBA1C 8.2 (H) 07/06/2023      Assessment & Plan:  There are no diagnoses linked to this  encounter.  Follow-up: No follow-ups on file.   Kara Dies, NP

## 2023-09-06 ENCOUNTER — Encounter: Payer: Self-pay | Admitting: Nurse Practitioner

## 2023-09-06 DIAGNOSIS — N289 Disorder of kidney and ureter, unspecified: Secondary | ICD-10-CM | POA: Insufficient documentation

## 2023-09-06 HISTORY — DX: Disorder of kidney and ureter, unspecified: N28.9

## 2023-09-06 NOTE — Assessment & Plan Note (Signed)
Patient is stable and no concerns following discharge.  All labs, imaging studies and clinical notes from the admission were reviewed with the patient.

## 2023-09-06 NOTE — Assessment & Plan Note (Signed)
Decreased kidney function noted during recent hospitalization. Patient reports adequate fluid intake. -Order labs to check current kidney function.

## 2023-09-06 NOTE — Assessment & Plan Note (Signed)
Recent hospitalization for low potassium (2.1 on 09/01/2023, improved to 3.0 on 09/02/2023). Patient reports adherence to prescribed potassium supplementation since discharge. -Order labs to check current potassium level.

## 2023-09-18 ENCOUNTER — Other Ambulatory Visit: Payer: Medicare HMO

## 2023-09-18 NOTE — Progress Notes (Deleted)
 09/18/2023 Name: Catherine [name] MRN: [id number] DOB: [birth year]  Subjective  No chief complaint on file.    Care Team: Primary Care Provider: Marylynn Verneita CROME, MD  Reason for visit: ?  Catherine [name] is a 68 y.o. female with a history of diabetes (type 2), who presents today for a follow up diabetes pharmacotherapy visit.? Pertinent PMH also includes CAD, HTN, OSA, emphysema.    They were referred to the pharmacist by their PCP for assistance in managing diabetes.   Known DM Complications: nephropathy w albuminuria  Summary of Recent Change:  10/30: ??Tresiba  24?26 units qd. Novolog  15/25/25 (dec breakfast 2/2 mid-day lows) 10/8: ??Tresiba  30?34 units (13% inc)  Since Last visit / History of Present Illness: ?  Patient reports implementing plan from last visit. Denies adverse effects with titration of Ozempic  previously. Has been doing well on 1 mg dose for almost 2 months. No nausea/abdominal discomfort. Stool remains regular q2-3 days. Using Lactulose  a couple times per week if needed. Reports rare hypoglycemia, improved since reducing AM Novolog  dose.  Prescription drug coverage: Payor: A[employer] MEDICARE / Plan: AETNA MEDICARE HMO/PPO / Product Type: *No Product type* / .   Reports that all medications are affordable.  Current Patient Assistance: NovoNordisk (Ozempic , Novolog , Tresiba ) [facility] team has reached out to patient for 2025 renewal.  Medication Adherence: Patient denies missing doses of their medication.    Reported DM Regimen: ?  Metformin  1000 mg twice daily Ozempic  1 mg sq once weekly (increased on 06/15/23)  Tresiba  36 units once sq daily Novolog  15/25/25 units sq [facility]   DM medications tried in the past:?  N/A  Overall, patient thinks that blood sugars are lower since last diabetes related visit. Also feels that sugars are more stable (less random spikes)  SMBG  Per Libre 3 CGM : ? Uses smart phone CGM Report (Date Range 11/12 -  11/25) ABG 194 mg/dL; GMI 1.9%; Variability 26%; TIR 42%, high 45%, very high 14%, low 0%, very low 0%   Hypo/Hyperglycemia: ?  Symptoms of hypoglycemia since last visit:? yes; once mid-afternoon.  Symptoms of hyperglycemia since last visit:? no - none  Reported Diet: Patient typically eats 3 meals per day. Has been working hard to reduce nighttime sugars Current meal patterns:  - Breakfast (7-10 am): oatmeal and toast; sandwich - Lunch (2-3 pm): frozen meal, soup, or a sandwich - Supper (7-10 pm): frozen dinners; she and son will order take out - once a week chinese (eats half);  - Snacks: apple, small handful of peanuts  - Drinks: diet mt dew, some water    Exercise: very little right now, limited by mobility.   DM Prevention:  Statin: Taking; high intensity.?  History of chronic kidney disease? yes History of albuminuria? yes (mildly increased, 10.7 in 2022), last UACR on 11/24/22 = 2.9 mg/g ACE/ARB - Taking losartan  100 mg daily; Urine MA/CR Ratio - normal.  Last eye exam: No recent record - due; No retinopathy present on previous exams Last foot exam: 12/13/2020 Tobacco Use: Former smoker Immunizations:? Flu: Up to date (last 07/08/23), Pneumococcal: PPSV23 (12/08/2011) PCV13 (06/12/2014). Not received since age 53, Shingrix: No Record - DUE, Covid (no record - DUE)  Cardiovascular Risk Reduction History of clinical ASCVD? No* The ASCVD Risk score (Arnett DK, et al., 2019) failed to calculate for the following reasons:   The valid total cholesterol range is 130 to 320 mg/dL CT findings: *Coronary artery disease noted - Aortic atherosclerosis in addition to  the coronary arteries, including calcified atherosclerotic plaque in the LAD. Severity not detailed. History of heart failure? no N/A History of hyperlipidemia? yes Current BMI: 45.3 kg/m2 (Ht 61 in, Wt 109 kg) Taking statin? yes; high intensity (rosuvastatin  40 mg daily) Taking aspirin ? Taking   Taking SGLT-2i? no Taking  GLP- 1 RA? yes   Hypertension Current Medications: hydrochlorothiazide  25 mg daily, losartan  100 mg daily - notes no refills remain on this, metoprolol  succinate 25 mg daily  Patient is not checking blood pressure at home currently. Denies dizziness/lightheadedness. No headaches, vision changes, SOB or chest pains/pressures.     _______________________________________________  Objective    Review of Systems:?  Constitutional:? No fever, chills or unintentional weight loss  Cardiovascular:? No chest pain or pressure, shortness of breath, dyspnea on exertion, orthopnea or LE edema  Pulmonary:? No cough or shortness of breath  GI:? No nausea, vomiting, constipation, diarrhea, abdominal pain, dyspepsia, change in bowel habits  Endocrine:? No polyuria, polyphagia or blurred vision  Psych:? No depression, anxiety, insomnia    Physical Examination:  Vitals:  Wt Readings from Last 3 Encounters:  09/04/23 252 lb (114.3 kg)  09/01/23 246 lb (111.6 kg)  08/31/23 246 lb (111.6 kg)   BP Readings from Last 3 Encounters:  09/04/23 138/70  09/02/23 (!) 114/59  08/31/23 134/68   Pulse Readings from Last 3 Encounters:  09/04/23 97  09/02/23 79  08/31/23 77     Labs:?  Lab Results  Component Value Date   HGBA1C 8.2 (H) 07/06/2023   HGBA1C 7.8 (A) 11/24/2022   HGBA1C 8.2 (H) 07/11/2022   GLUCOSE 184 (H) 09/04/2023   MICRALBCREAT 2.9 11/24/2022   MICRALBCREAT 6.0 03/04/2022   MICRALBCREAT 10.7 12/13/2020   CREATININE 1.26 (H) 09/04/2023   CREATININE 1.43 (H) 09/02/2023   CREATININE 1.28 (H) 09/02/2023   GFR 43.69 (L) 09/04/2023   GFR 52.04 (L) 07/06/2023   GFR 66.66 11/24/2022    Lab Results  Component Value Date   CHOL 116 07/06/2023   LDLCALC 45 07/06/2023   LDLCALC 45 12/13/2020   LDLCALC 73 12/10/2018   LDLDIRECT 57.0 07/06/2023   HDL 37.60 (L) 07/06/2023   HDL 46.90 11/24/2022   HDL 43.50 07/11/2022   AST 41 (H) 09/04/2023   AST 51 (H) 08/31/2023   ALT 16 09/04/2023    ALT 20 08/31/2023      Chemistry      Component Value Date/Time   NA 142 09/04/2023 1330   NA 143 01/03/2013 1506   K 3.3 (L) 09/04/2023 1330   K 3.4 (L) 01/03/2013 1506   CL 99 09/04/2023 1330   CL 107 01/03/2013 1506   CO2 33 (H) 09/04/2023 1330   CO2 28 01/03/2013 1506   BUN 19 09/04/2023 1330   BUN 8 01/03/2013 1506   CREATININE 1.26 (H) 09/04/2023 1330   CREATININE 0.86 12/10/2018 1447      Component Value Date/Time   CALCIUM  9.7 09/04/2023 1330   CALCIUM  8.3 (L) 01/03/2013 1506   ALKPHOS 71 09/04/2023 1330   ALKPHOS 189 (H) 11/26/2012 2325   AST 41 (H) 09/04/2023 1330   AST 42 (H) 11/26/2012 2325   ALT 16 09/04/2023 1330   ALT 32 11/26/2012 2325   BILITOT 0.3 09/04/2023 1330   BILITOT 0.3 11/26/2012 2325       The ASCVD Risk score (Arnett DK, et al., 2019) failed to calculate for the following reasons:   The valid total cholesterol range is 130 to 320 mg/dL  Assessment  and Plan:   1. Diabetes, type 2: Uncontrolled per last A1c of 8.0 (07/08/23), relatively unchanged from previous 7.8% (11/24/22). Goal <7% without hypoglycemia. Overnight sugars remain somewhat variable depending on evening meal, though improved in the past week. Intermittent hyperglycemia in later part of the day, though sufficiently reduced with current prandial doses. Dose increase would likely cause inc in lows. In the new year, will plan for Ozempic  titration on Novo MAP renewal for further improvement of post-prandial hyperglycemia.  - Current Regimen: metformin  1000 mg BID, Ozempic  1 mg weekly, Tresiba  36 units daily, Novolog  15/25/25 units TIDAC Continue Tresiba  36 units daily, Novolog  15/25/25 units B/L/D Continue all other medications today without changes.  Reviewed signs/symptoms/treatment of hypoglycemia  Future Consideration: GLP1-RA: Ozempic  titration to 2.0 mg weekly in January on Novo renewal. January f/u to review CGM prior. Mounjaro cost prohibitive given no MAP.  SGLT2i:  Reasonable - Consider obtaining through MAP in the new year [AZ&Me Farxiga; BI Cares Jardiance ) SU: Not ideal in the setting of insulin  use. Safer options available.  TZD: Avoiding due to possible weight gain and possible increase in fracture risk with minimal A1c-reduction.    2. HTN: Slightly elevated based on last clinic BP of 134/86 mmHg (11/2022), goal <130/80 mmHg. Does not monitor BP at home. Denies lightheadedness, dizziness, SOB, CP, vision changes.  Current Regimen: losartan  100 mg daily, hydrochlorothiazide  25 mg daily, metoprolol  succinate 25 mg daily Continue medications without changes.    3. ASCVD (primary prevention): The ASCVD Risk score (Arnett DK, et al., 2019) failed to calculate for the following reasons:   The valid total cholesterol range is 130 to 320 mg/dL. On last lipid panel (11/24/22) LDL was 69 mg/dL, above goal of >44 mg/dL for confirmed atherosclerotic disease as well as numerous risk factors.  Key risk factors include: diabetes, hypertension, hyperlipidemia, former smoker, BMI >30 kg/m2, sedentary lifestyle, and Known CAD (aortic atherosclerosis on imaging) Current Regimen: rosuvastatin  40 mg daily Continue medications today without changes.  Future Consideration: Ezetimibe 10 mg daily reasonable for further LDL reduction if patient amenable and would provide additional CV benefit   4. Healthcare Maintenance:  Pneumococcal - Current status: Due (PCV13 and PPSV23 prior to age 72).   Shingles - Current status: No record - Due Influenza - Current status: Up to date 07/08/23 Due to receive the following vaccines: Shingrix, PCV20, and Covid Booster One dose of PCV20 or PCV21 or PPSV23 then series is complete (has been >5 years since last pneumo vax)   Follow Up PCP follow up scheduled in 3 weeks; 07/08/23 Follow up with clinical pharmacist via phone ~4 weeks after PCP visit.  ?   Future Appointments  Date Time Provider Department Center  09/18/2023  2:00 PM  [facility] PHARMACIST [facility]  10/28/2023  2:20 PM [facility] ANNUAL WELLNESS VISIT [facility]   Manuelita FABIENE Kobs, PharmD Clinical Pharmacist [facility] [phone]

## 2023-09-20 ENCOUNTER — Other Ambulatory Visit: Payer: Self-pay | Admitting: Internal Medicine

## 2023-09-21 ENCOUNTER — Other Ambulatory Visit: Payer: Medicare HMO

## 2023-09-21 NOTE — Progress Notes (Deleted)
 09/21/2023 Name: Catherine [name] MRN: [id number] DOB: 24-Sep-1955  Subjective  No chief complaint on file.    Care Team: Primary Care Provider: Marylynn Verneita CROME, MD  Reason for visit: ?  Catherine [name] is a 68 y.o. female with a history of diabetes (type 2), who presents today for a follow up diabetes pharmacotherapy visit.? Pertinent PMH also includes CAD, HTN, OSA, emphysema.    They were referred to the pharmacist by their PCP for assistance in managing diabetes.   Known DM Complications: nephropathy w albuminuria  Summary of Recent Change:  10/30: ??Tresiba  24?26 units qd. Novolog  15/25/25 (dec breakfast 2/2 mid-day lows) 10/8: ??Tresiba  30?34 units (13% inc)  Since Last visit / History of Present Illness: ?  Patient reports implementing plan from last visit. Denies adverse effects with titration of Ozempic  previously. Has been doing well on 1 mg dose for almost 2 months. No nausea/abdominal discomfort. Stool remains regular q2-3 days. Using Lactulose  a couple times per week if needed. Reports rare hypoglycemia, improved since reducing AM Novolog  dose.  Prescription drug coverage: Payor: AETNA MEDICARE / Plan: AETNA MEDICARE HMO/PPO / Product Type: *No Product type* / .   Reports that all medications are affordable.  Current Patient Assistance: NovoNordisk (Ozempic , Novolog , Tresiba ) [facility] team has reached out to patient for 2025 renewal.  Medication Adherence: Patient denies missing doses of their medication.    Reported DM Regimen: ?  Metformin  1000 mg twice daily Ozempic  1 mg sq once weekly (increased on 06/15/23)  Tresiba  36 units once sq daily Novolog  15/25/25 units sq [facility]   DM medications tried in the past:?  N/A  Overall, patient thinks that blood sugars are lower since last diabetes related visit. Also feels that sugars are more stable (less random spikes)  SMBG  Per Libre 3 CGM : ? Uses smart phone CGM Report (Date Range 11/12 -  11/25) ABG 194 mg/dL; GMI 1.9%; Variability 26%; TIR 42%, high 45%, very high 14%, low 0%, very low 0%   Hypo/Hyperglycemia: ?  Symptoms of hypoglycemia since last visit:? yes; once mid-afternoon.  Symptoms of hyperglycemia since last visit:? no - none  Reported Diet: Patient typically eats 3 meals per day. Has been working hard to reduce nighttime sugars Current meal patterns:  - Breakfast (7-10 am): oatmeal and toast; sandwich - Lunch (2-3 pm): frozen meal, soup, or a sandwich - Supper (7-10 pm): frozen dinners; she and son will order take out - once a week chinese (eats half);  - Snacks: apple, small handful of peanuts  - Drinks: diet mt dew, some water    Exercise: very little right now, limited by mobility.   DM Prevention:  Statin: Taking; high intensity.?  History of chronic kidney disease? yes History of albuminuria? yes (mildly increased, 10.7 in 2022), last UACR on 11/24/22 = 2.9 mg/g ACE/ARB - Taking losartan  100 mg daily; Urine MA/CR Ratio - normal.  Last eye exam: No recent record - due; No retinopathy present on previous exams Last foot exam: 12/13/2020 Tobacco Use: Former smoker Immunizations:? Flu: Up to date (last 07/08/23), Pneumococcal: PPSV23 (12/08/2011) PCV13 (06/12/2014). Not received since age 77, Shingrix: No Record - DUE, Covid (no record - DUE)  Cardiovascular Risk Reduction History of clinical ASCVD? No* The ASCVD Risk score (Arnett DK, et al., 2019) failed to calculate for the following reasons:   The valid total cholesterol range is 130 to 320 mg/dL CT findings: *Coronary artery disease noted - Aortic atherosclerosis in addition to  the coronary arteries, including calcified atherosclerotic plaque in the LAD. Severity not detailed. History of heart failure? no N/A History of hyperlipidemia? yes Current BMI: 45.3 kg/m2 (Ht 61 in, Wt 109 kg) Taking statin? yes; high intensity (rosuvastatin  40 mg daily) Taking aspirin ? Taking   Taking SGLT-2i? no Taking  GLP- 1 RA? yes   Hypertension Current Medications: hydrochlorothiazide  25 mg daily, losartan  100 mg daily - notes no refills remain on this, metoprolol  succinate 25 mg daily  Patient is not checking blood pressure at home currently. Denies dizziness/lightheadedness. No headaches, vision changes, SOB or chest pains/pressures.     _______________________________________________  Objective    Review of Systems:?  Constitutional:? No fever, chills or unintentional weight loss  Cardiovascular:? No chest pain or pressure, shortness of breath, dyspnea on exertion, orthopnea or LE edema  Pulmonary:? No cough or shortness of breath  GI:? No nausea, vomiting, constipation, diarrhea, abdominal pain, dyspepsia, change in bowel habits  Endocrine:? No polyuria, polyphagia or blurred vision  Psych:? No depression, anxiety, insomnia    Physical Examination:  Vitals:  Wt Readings from Last 3 Encounters:  09/04/23 252 lb (114.3 kg)  09/01/23 246 lb (111.6 kg)  08/31/23 246 lb (111.6 kg)   BP Readings from Last 3 Encounters:  09/04/23 138/70  09/02/23 (!) 114/59  08/31/23 134/68   Pulse Readings from Last 3 Encounters:  09/04/23 97  09/02/23 79  08/31/23 77     Labs:?  Lab Results  Component Value Date   HGBA1C 8.2 (H) 07/06/2023   HGBA1C 7.8 (A) 11/24/2022   HGBA1C 8.2 (H) 07/11/2022   GLUCOSE 184 (H) 09/04/2023   MICRALBCREAT 2.9 11/24/2022   MICRALBCREAT 6.0 03/04/2022   MICRALBCREAT 10.7 12/13/2020   CREATININE 1.26 (H) 09/04/2023   CREATININE 1.43 (H) 09/02/2023   CREATININE 1.28 (H) 09/02/2023   GFR 43.69 (L) 09/04/2023   GFR 52.04 (L) 07/06/2023   GFR 66.66 11/24/2022    Lab Results  Component Value Date   CHOL 116 07/06/2023   LDLCALC 45 07/06/2023   LDLCALC 45 12/13/2020   LDLCALC 73 12/10/2018   LDLDIRECT 57.0 07/06/2023   HDL 37.60 (L) 07/06/2023   HDL 46.90 11/24/2022   HDL 43.50 07/11/2022   AST 41 (H) 09/04/2023   AST 51 (H) 08/31/2023   ALT 16 09/04/2023    ALT 20 08/31/2023      Chemistry      Component Value Date/Time   NA 142 09/04/2023 1330   NA 143 01/03/2013 1506   K 3.3 (L) 09/04/2023 1330   K 3.4 (L) 01/03/2013 1506   CL 99 09/04/2023 1330   CL 107 01/03/2013 1506   CO2 33 (H) 09/04/2023 1330   CO2 28 01/03/2013 1506   BUN 19 09/04/2023 1330   BUN 8 01/03/2013 1506   CREATININE 1.26 (H) 09/04/2023 1330   CREATININE 0.86 12/10/2018 1447      Component Value Date/Time   CALCIUM  9.7 09/04/2023 1330   CALCIUM  8.3 (L) 01/03/2013 1506   ALKPHOS 71 09/04/2023 1330   ALKPHOS 189 (H) 11/26/2012 2325   AST 41 (H) 09/04/2023 1330   AST 42 (H) 11/26/2012 2325   ALT 16 09/04/2023 1330   ALT 32 11/26/2012 2325   BILITOT 0.3 09/04/2023 1330   BILITOT 0.3 11/26/2012 2325       The ASCVD Risk score (Arnett DK, et al., 2019) failed to calculate for the following reasons:   The valid total cholesterol range is 130 to 320 mg/dL  Assessment  and Plan:   1. Diabetes, type 2: Uncontrolled per last A1c of 8.0 (07/08/23), relatively unchanged from previous 7.8% (11/24/22). Goal <7% without hypoglycemia. Overnight sugars remain somewhat variable depending on evening meal, though improved in the past week. Intermittent hyperglycemia in later part of the day, though sufficiently reduced with current prandial doses. Dose increase would likely cause inc in lows. In the new year, will plan for Ozempic  titration on Novo MAP renewal for further improvement of post-prandial hyperglycemia.  - Current Regimen: metformin  1000 mg BID, Ozempic  1 mg weekly, Tresiba  36 units daily, Novolog  15/25/25 units TIDAC Continue Tresiba  36 units daily, Novolog  15/25/25 units B/L/D Continue all other medications today without changes.  Reviewed signs/symptoms/treatment of hypoglycemia  Future Consideration: GLP1-RA: Ozempic  titration to 2.0 mg weekly in January on Novo renewal. January f/u to review CGM prior. Mounjaro cost prohibitive given no MAP.  SGLT2i:  Reasonable - Consider obtaining through MAP in the new year [AZ&Me Farxiga; BI Cares Jardiance ) SU: Not ideal in the setting of insulin  use. Safer options available.  TZD: Avoiding due to possible weight gain and possible increase in fracture risk with minimal A1c-reduction.    2. HTN: Slightly elevated based on last clinic BP of 134/86 mmHg (11/2022), goal <130/80 mmHg. Does not monitor BP at home. Denies lightheadedness, dizziness, SOB, CP, vision changes.  Current Regimen: losartan  100 mg daily, hydrochlorothiazide  25 mg daily, metoprolol  succinate 25 mg daily Continue medications without changes.    3. ASCVD (primary prevention): The ASCVD Risk score (Arnett DK, et al., 2019) failed to calculate for the following reasons:   The valid total cholesterol range is 130 to 320 mg/dL. On last lipid panel (11/24/22) LDL was 69 mg/dL, above goal of >44 mg/dL for confirmed atherosclerotic disease as well as numerous risk factors.  Key risk factors include: diabetes, hypertension, hyperlipidemia, former smoker, BMI >30 kg/m2, sedentary lifestyle, and Known CAD (aortic atherosclerosis on imaging) Current Regimen: rosuvastatin  40 mg daily Continue medications today without changes.  Future Consideration: Ezetimibe 10 mg daily reasonable for further LDL reduction if patient amenable and would provide additional CV benefit   4. Healthcare Maintenance:  Pneumococcal - Current status: Due (PCV13 and PPSV23 prior to age 46).   Shingles - Current status: No record - Due Influenza - Current status: Up to date 07/08/23 Due to receive the following vaccines: Shingrix, PCV20, and Covid Booster One dose of PCV20 or PCV21 or PPSV23 then series is complete (has been >5 years since last pneumo vax)   Follow Up PCP follow up scheduled in 3 weeks; 07/08/23 Follow up with clinical pharmacist via phone ~4 weeks after PCP visit.  ?   Future Appointments  Date Time Provider Department Center  10/28/2023  2:20 PM  [facility] ANNUAL WELLNESS VISIT [facility]   Catherine [name], PharmD Clinical Pharmacist [facility] [phone]

## 2023-09-26 DIAGNOSIS — G839 Paralytic syndrome, unspecified: Secondary | ICD-10-CM | POA: Diagnosis not present

## 2023-09-28 DIAGNOSIS — E1165 Type 2 diabetes mellitus with hyperglycemia: Secondary | ICD-10-CM | POA: Diagnosis not present

## 2023-09-30 DIAGNOSIS — C4359 Malignant melanoma of other part of trunk: Secondary | ICD-10-CM | POA: Diagnosis not present

## 2023-09-30 DIAGNOSIS — L578 Other skin changes due to chronic exposure to nonionizing radiation: Secondary | ICD-10-CM | POA: Diagnosis not present

## 2023-09-30 DIAGNOSIS — D485 Neoplasm of uncertain behavior of skin: Secondary | ICD-10-CM | POA: Diagnosis not present

## 2023-09-30 HISTORY — PX: OTHER SURGICAL HISTORY: SHX169

## 2023-10-08 DIAGNOSIS — A000 Cholera due to Vibrio cholerae 01, biovar cholerae: Secondary | ICD-10-CM | POA: Diagnosis not present

## 2023-10-08 DIAGNOSIS — C4359 Malignant melanoma of other part of trunk: Secondary | ICD-10-CM | POA: Diagnosis not present

## 2023-10-10 HISTORY — PX: OTHER SURGICAL HISTORY: SHX169

## 2023-10-14 ENCOUNTER — Other Ambulatory Visit: Payer: Self-pay | Admitting: Internal Medicine

## 2023-10-14 DIAGNOSIS — D485 Neoplasm of uncertain behavior of skin: Secondary | ICD-10-CM | POA: Diagnosis not present

## 2023-10-26 DIAGNOSIS — C7989 Secondary malignant neoplasm of other specified sites: Secondary | ICD-10-CM | POA: Diagnosis not present

## 2023-10-26 DIAGNOSIS — Z85820 Personal history of malignant melanoma of skin: Secondary | ICD-10-CM | POA: Diagnosis not present

## 2023-10-26 DIAGNOSIS — C779 Secondary and unspecified malignant neoplasm of lymph node, unspecified: Secondary | ICD-10-CM | POA: Diagnosis not present

## 2023-10-26 DIAGNOSIS — C4359 Malignant melanoma of other part of trunk: Secondary | ICD-10-CM | POA: Diagnosis not present

## 2023-10-27 DIAGNOSIS — R918 Other nonspecific abnormal finding of lung field: Secondary | ICD-10-CM | POA: Diagnosis not present

## 2023-10-27 DIAGNOSIS — G839 Paralytic syndrome, unspecified: Secondary | ICD-10-CM | POA: Diagnosis not present

## 2023-10-27 DIAGNOSIS — C4359 Malignant melanoma of other part of trunk: Secondary | ICD-10-CM | POA: Diagnosis not present

## 2023-10-28 ENCOUNTER — Ambulatory Visit (INDEPENDENT_AMBULATORY_CARE_PROVIDER_SITE_OTHER): Payer: Medicare HMO | Admitting: *Deleted

## 2023-10-28 VITALS — Ht 63.0 in | Wt 248.0 lb

## 2023-10-28 DIAGNOSIS — Z0000 Encounter for general adult medical examination without abnormal findings: Secondary | ICD-10-CM | POA: Diagnosis not present

## 2023-10-28 NOTE — Progress Notes (Signed)
Subjective:   Catherine [name] is a 68 y.o. female who presents for Medicare Annual (Subsequent) preventive examination.  Visit Complete: Virtual I connected with  Catherine [name] on 10/28/23 by a audio enabled telemedicine application and verified that I am speaking with the correct person using two identifiers.This patient declined Interactive audio and Acupuncturist. Therefore the visit was completed with audio only.   Patient Location: Home  Provider Location: Home Office  I discussed the limitations of evaluation and management by telemedicine. The patient expressed understanding and agreed to proceed.  Vital Signs: Because this visit was a virtual/telehealth visit, some criteria may be missing or patient reported. Any vitals not documented were not able to be obtained and vitals that have been documented are patient reported.   Cardiac Risk Factors include: advanced age (>59men, >1 women);diabetes mellitus;obesity (BMI >30kg/m2);dyslipidemia;hypertension;sedentary lifestyle     Objective:    Today's Vitals   10/28/23 1415  Weight: 248 lb (112.5 kg)  Height: 5\' 3"  (1.6 m)   Body mass index is 43.93 kg/m.     10/28/2023    2:34 PM 09/02/2023    1:57 AM 09/01/2023   10:40 AM 10/27/2022    3:48 PM 06/19/2022    9:39 AM 10/23/2021    3:40 PM 02/21/2020   10:02 AM  Advanced Directives  Does Patient Have a Medical Advance Directive? No  No No No No No  Would patient like information on creating a medical advance directive? No - Patient declined No - Patient declined  No - Patient declined No - Patient declined No - Patient declined No - Patient declined    Current Medications (verified) Outpatient Encounter Medications as of 10/28/2023  Medication Sig   albuterol (VENTOLIN HFA) 108 (90 Base) MCG/ACT inhaler INHALE 1-2 PUFFS BY MOUTH EVERY 6 HOURS AS NEEDED FOR WHEEZE OR SHORTNESS OF BREATH   ALPRAZolam (XANAX) 0.25 MG tablet Take 1 tablet (0.25 mg total) by  mouth 2 (two) times daily as needed for anxiety or sleep.   aspirin 81 MG EC tablet Take 81 mg by mouth daily.   Budeson-Glycopyrrol-Formoterol (BREZTRI AEROSPHERE) 160-9-4.8 MCG/ACT AERO Inhale 2 puffs into the lungs 2 (two) times daily.   Cholecalciferol (VITAMIN D3) 125 MCG (5000 UT) CAPS Take 1,000 Units by mouth daily.    Continuous Glucose Sensor (FREESTYLE LIBRE 3 SENSOR) MISC Place 1 sensor on the skin every 14 days. Use to check glucose continuously   cyclobenzaprine (FLEXERIL) 5 MG tablet TAKE 1 TABLET BY MOUTH THREE TIMES A DAY AS NEEDED FOR MUSCLE SPASM   insulin aspart (NOVOLOG FLEXPEN) 100 UNIT/ML FlexPen Inject 20 Units into the skin 3 (three) times daily with meals. (Patient taking differently: Inject 20 Units into the skin 3 (three) times daily with meals. 15 units in the morning, 25 units in the afternoon, 25 units in the evening)   insulin degludec (TRESIBA FLEXTOUCH) 200 UNIT/ML FlexTouch Pen Inject 34 Units into the skin daily. (Patient taking differently: Inject 36 Units into the skin daily.)   ipratropium-albuterol (DUONEB) 0.5-2.5 (3) MG/3ML SOLN Take 3 mLs by nebulization every 6 (six) hours as needed.   lactulose (CHRONULAC) 10 GM/15ML solution TAKE 30 ML EVERY 4 HOURS UNTIL CONSTIPATION IS RELIEVED   losartan (COZAAR) 100 MG tablet TAKE 1 TABLET BY MOUTH EVERY DAY   Magnesium Citrate (MAGNESIUM GUMMIES PO) Take by mouth. Chews two a day   Melatonin 5 MG TABS Take 5 mg by mouth at bedtime.   metFORMIN (GLUCOPHAGE) 1000 MG tablet  TAKE 1 TABLET (1,000 MG TOTAL) BY MOUTH TWICE A DAY WITH FOOD   metoprolol succinate (TOPROL-XL) 25 MG 24 hr tablet TAKE 1 TABLET BY MOUTH EVERY DAY   PARoxetine (PAXIL) 20 MG tablet TAKE 1 TABLET BY MOUTH EVERY DAY IN THE MORNING   polyethylene glycol powder (GLYCOLAX/MIRALAX) 17 GM/SCOOP powder Take 1 Container by mouth 2 (two) times daily.   Potassium 99 MG TABS Take 99 mg by mouth daily. Reported on 09/20/2015   Probiotic Product (PROBIOTIC  DAILY PO) Take by mouth daily.   promethazine (PHENERGAN) 12.5 MG tablet TAKE 1 TABLET (12.5 MG TOTAL) BY MOUTH AT BEDTIME AS NEEDED FOR NAUSEA OR VOMITING.   rosuvastatin (CRESTOR) 40 MG tablet TAKE 1 TABLET BY MOUTH EVERY DAY   Semaglutide, 1 MG/DOSE, 4 MG/3ML SOPN Inject 1 mg as directed once a week.   No facility-administered encounter medications on file as of 10/28/2023.    Allergies (verified) Sulfa antibiotics, Hydroxychloroquine, Oxycodone-acetaminophen, Percocet [oxycodone-acetaminophen], Vicodin [hydrocodone-acetaminophen], Aspirin, Ibuprofen, and Omnipaque [iohexol]   History: Past Medical History:  Diagnosis Date   Anxiety    Asthma    COPD (chronic obstructive pulmonary disease) (HCC)    COPD exacerbation (HCC) 08/10/2011   Exacerbation 12/2011 June 2013 mMRC 2 July PFT Ratio 50%, FEV1 0.91 L, 41% pred, DLCO 51% pred, TLC 3.39 L 72% pred Exacerbation 04/2012, admitted [facility]   Depression    Diabetes mellitus    Gastritis and duodenitis June 2011   EGD   GERD (gastroesophageal reflux disease)    Hyperglycemia    Hyperlipidemia    Hypertension    Leukocytosis    Obesity (BMI 30-39.9)    Renal insufficiency 09/06/2023   S/P cardiac catheterization March 2012   normal coronaries,  due to chest pain Mariah Milling)   Screening for breast cancer Jul 28 2011   normal   Screening for colon cancer June 2011   polyps found, next one due 2014 ([facility])   Tobacco abuse, in remission    quit oct during hospitalization    Past Surgical History:  Procedure Laterality Date   ABDOMINAL HYSTERECTOMY     ABDOMINAL HYSTERECTOMY     BILATERAL OOPHORECTOMY  1995   and TAH.  noncancerous reasons   biospy under arm Right 10/2023   CARDIAC CATHETERIZATION  11/22/2010   no significant disease   lesion removed from back  09/30/2023   Family History  Problem Relation Age of Onset   Coronary artery disease Mother 2   Heart disease Mother        CABG x5   COPD Mother 19   Breast cancer  Mother    Kidney disease Mother    Heart failure Father    Angina Father    Diabetes Father    Depression Father    Stroke Father    Dementia Father    Cancer Sister        breast, lung, brain   Asthma Brother    Asthma Brother    Diabetes Brother    Hypertension Brother    Social History   Socioeconomic History   Marital status: Married    Spouse name: Not on file   Number of children: 4   Years of education: Not on file   Highest education level: Not on file  Occupational History   Occupation: Producer, television/film/video: [employer]  Tobacco Use   Smoking status: Former    Current packs/day: 0.00    Average packs/day: 1 pack/day for 40.0  years (40.0 ttl pk-yrs)    Types: Cigarettes    Start date: 06/27/1971    Quit date: 06/27/2011    Years since quitting: 12.3   Smokeless tobacco: Never  Vaping Use   Vaping status: Never Used  Substance and Sexual Activity   Alcohol use: No   Drug use: No   Sexual activity: Never  Other Topics Concern   Not on file  Social History Narrative   Married   Social Drivers of Health   Financial Resource Strain: Low Risk  (10/28/2023)   Overall Financial Resource Strain (CARDIA)    Difficulty of Paying Living Expenses: Not hard at all  Food Insecurity: No Food Insecurity (10/28/2023)   Hunger Vital Sign    Worried About Running Out of Food in the Last Year: Never true    Ran Out of Food in the Last Year: Never true  Transportation Needs: No Transportation Needs (10/28/2023)   PRAPARE - Administrator, Civil Service (Medical): No    Lack of Transportation (Non-Medical): No  Physical Activity: Inactive (10/28/2023)   Exercise Vital Sign    Days of Exercise per Week: 0 days    Minutes of Exercise per Session: 0 min  Stress: No Stress Concern Present (10/28/2023)   [employer] of Occupational Health - Occupational Stress Questionnaire    Feeling of Stress : Only a little  Social Connections: Moderately Isolated (10/28/2023)    Social Connection and Isolation Panel [NHANES]    Frequency of Communication with Friends and Family: Three times a week    Frequency of Social Gatherings with Friends and Family: Twice a week    Attends Religious Services: Never    Database administrator or Organizations: No    Attends Engineer, structural: Never    Marital Status: Married    Tobacco Counseling Counseling given: Not Answered   Clinical Intake:  Pre-visit preparation completed: Yes  Pain : No/denies pain     BMI - recorded: 43.93 Nutritional Status: BMI > 30  Obese Nutritional Risks: None Diabetes: Yes CBG done?: Yes (non fasting 217 per patient) CBG resulted in Enter/ Edit results?: No Did pt. bring in CBG monitor from home?: No  How often do you need to have someone help you when you read instructions, pamphlets, or other written materials from your doctor or pharmacy?: 1 - Never  Interpreter Needed?: No  Information entered by :: R. Tykesha Konicki LPN   Activities of Daily Living    10/28/2023    2:18 PM 09/02/2023    1:00 AM  In your present state of health, do you have any difficulty performing the following activities:  Hearing? 0 0  Vision? 0 0  Difficulty concentrating or making decisions? 0 0  Walking or climbing stairs? 1   Dressing or bathing? 0   Doing errands, shopping? 1 0  Comment husband drives her   Preparing Food and eating ? N   Using the Toilet? N   In the past six months, have you accidently leaked urine? N   Do you have problems with loss of bowel control? N   Managing your Medications? N   Managing your Finances? N   Housekeeping or managing your Housekeeping? Y     Patient Care Team: Sherlene Shams, MD as PCP - General (Internal Medicine) Loree Fee, [facility] (Pharmacist)  Indicate any recent Medical Services you may have received from other than [facility] providers in the past year (date may be  approximate).     Assessment:   This is a routine wellness examination  for Dedra.  Hearing/Vision screen Hearing Screening - Comments:: No issues Vision Screening - Comments:: readers   Goals Addressed             This Visit's Progress    Patient Stated       Wants to be abe to sleep better so she does not feel so tired       Depression Screen    10/28/2023    2:28 PM 09/04/2023    1:00 PM 08/31/2023    4:29 PM 07/08/2023    4:10 PM 11/24/2022   11:07 AM 10/27/2022    3:49 PM 08/13/2022    4:39 PM  PHQ 2/9 Scores  PHQ - 2 Score 0 0 1 3 0 0 0  PHQ- 9 Score 7 6 8 11 3   0    Fall Risk    10/28/2023    2:21 PM 09/04/2023   12:59 PM 08/31/2023    4:29 PM 07/08/2023    4:10 PM 11/24/2022   11:06 AM  Fall Risk   Falls in the past year? 1 1 1 1 1   Number falls in past yr: 1 1 1  0 1  Injury with Fall? 0 0 0 0 0  Risk for fall due to : History of fall(s);Impaired balance/gait History of fall(s) No Fall Risks History of fall(s) History of fall(s)  Follow up Falls evaluation completed;Falls prevention discussed Falls evaluation completed Falls evaluation completed Falls evaluation completed Falls evaluation completed    MEDICARE RISK AT HOME: Medicare Risk at Home Any stairs in or around the home?: Yes If so, are there any without handrails?: No Home free of loose throw rugs in walkways, pet beds, electrical cords, etc?: Yes Adequate lighting in your home to reduce risk of falls?: Yes Life alert?: No Use of a cane, walker or w/c?: Yes Grab bars in the bathroom?: Yes Shower chair or bench in shower?: No Elevated toilet seat or a handicapped toilet?: No    Cognitive Function:    02/16/2018    4:53 PM  MMSE - Mini Mental State Exam  Orientation to time 5  Orientation to Place 5  Registration 3  Attention/ Calculation 5  Recall 3  Language- name 2 objects 2  Language- repeat 1  Language- follow 3 step command 3  Language- read & follow direction 1  Write a sentence 1  Copy design 1  Total score 30        10/28/2023    2:35  PM 10/27/2022    3:53 PM 02/21/2020   10:03 AM 02/18/2019    9:46 AM 08/08/2016    4:33 PM  6CIT Screen  What Year? 0 points 0 points 0 points 0 points 0 points  What month? 0 points 0 points 0 points 0 points 0 points  What time? 0 points 0 points  0 points 0 points  Count back from 20 0 points 0 points  0 points 0 points  Months in reverse 0 points 0 points 0 points 0 points 0 points  Repeat phrase 0 points 0 points 0 points  0 points  Total Score 0 points 0 points   0 points    Immunizations Immunization History  Administered Date(s) Administered   Fluad Quad(high Dose 65+) 06/26/2020   Fluad Trivalent(High Dose 65+) 07/08/2023   Hepatitis B 01/21/2012   Influenza Whole 07/10/2011, 08/31/2012   Influenza,inj,Quad PF,6+ Mos 06/12/2014,  10/06/2016, 09/16/2018   Pneumococcal Conjugate-13 06/12/2014   Pneumococcal Polysaccharide-23 12/08/2011   Tdap 12/22/2005    TDAP status: Due, Education has been provided regarding the importance of this vaccine. Advised may receive this vaccine at local pharmacy or Health Dept. Aware to provide a copy of the vaccination record if obtained from local pharmacy or Health Dept. Verbalized acceptance and understanding.  Flu Vaccine status: Up to date  Pneumococcal vaccine status: Due, Education has been provided regarding the importance of this vaccine. Advised may receive this vaccine at local pharmacy or Health Dept. Aware to provide a copy of the vaccination record if obtained from local pharmacy or Health Dept. Verbalized acceptance and understanding.  Covid-19 vaccine status: Declined, Education has been provided regarding the importance of this vaccine but patient still declined. Advised may receive this vaccine at local pharmacy or Health Dept.or vaccine clinic. Aware to provide a copy of the vaccination record if obtained from local pharmacy or Health Dept. Verbalized acceptance and understanding.  Qualifies for Shingles Vaccine? Yes   Zostavax  completed No   Shingrix Completed?: No.    Education has been provided regarding the importance of this vaccine. Patient has been advised to call insurance company to determine out of pocket expense if they have not yet received this vaccine. Advised may also receive vaccine at local pharmacy or Health Dept. Verbalized acceptance and understanding.  Screening Tests Health Maintenance  Topic Date Due   COVID-19 Vaccine (1) Never done   Zoster Vaccines- Shingrix (1 of 2) Never done   DTaP/Tdap/Td (2 - Td or Tdap) 12/23/2015   OPHTHALMOLOGY EXAM  01/29/2017   Pneumonia Vaccine 76+ Years old (3 of 3 - PPSV23 or PCV20) 07/31/2019   DEXA SCAN  Never done   Lung Cancer Screening  01/18/2021   FOOT EXAM  12/13/2021   Medicare Annual Wellness (AWV)  10/28/2023   Diabetic kidney evaluation - Urine ACR  11/24/2023   MAMMOGRAM  07/07/2024 (Originally 08/19/2018)   Colonoscopy  07/07/2024 (Originally 09/25/2020)   HEMOGLOBIN A1C  01/04/2024   Diabetic kidney evaluation - eGFR measurement  09/03/2024   INFLUENZA VACCINE  Completed   Hepatitis C Screening  Completed   HPV VACCINES  Aged Out    Health Maintenance  Health Maintenance Due  Topic Date Due   COVID-19 Vaccine (1) Never done   Zoster Vaccines- Shingrix (1 of 2) Never done   DTaP/Tdap/Td (2 - Td or Tdap) 12/23/2015   OPHTHALMOLOGY EXAM  01/29/2017   Pneumonia Vaccine 85+ Years old (3 of 3 - PPSV23 or PCV20) 07/31/2019   DEXA SCAN  Never done   Lung Cancer Screening  01/18/2021   FOOT EXAM  12/13/2021   Medicare Annual Wellness (AWV)  10/28/2023   Diabetic kidney evaluation - Urine ACR  11/24/2023   Colorectal cancer screening Patient stated that she is not able to have one because of her health   Mammogram status  patient declines Patient stated that she had a PET scan done 10/27/23 at [facility]  Bone Density status Patient declines   Lung Cancer Screening: (Low Dose CT Chest recommended if Age 46-80 years, 20 pack-year currently  smoking OR have quit w/in 15years.) does qualify.    Additional Screening:  Hepatitis C Screening: does qualify; Completed 07/2020  Vision Screening: Recommended annual ophthalmology exams for early detection of glaucoma and other disorders of the eye. Is the patient up to date with their annual eye exam?  No  Who is the provider or  what is the name of the office in which the patient attends annual eye exams? [facility], Patient stated that she will call and schedule a diabetic eye exam If pt is not established with a provider, would they like to be referred to a provider to establish care? No .   Dental Screening: Recommended annual dental exams for proper oral hygiene  Diabetic Foot Exam: Diabetic Foot Exam: Overdue, Pt has been advised about the importance in completing this exam. Pt is scheduled for diabetic foot exam on needs completed and documented at next visit.  Community Resource Referral / Chronic Care Management: CRR required this visit?  No   CCM required this visit?  No     Plan:     I have personally reviewed and noted the following in the patient's chart:   Medical and social history Use of alcohol, tobacco or illicit drugs  Current medications and supplements including opioid prescriptions. Patient is not currently taking opioid prescriptions. Functional ability and status Nutritional status Physical activity Advanced directives List of other physicians Hospitalizations, surgeries, and ER visits in previous 12 months Vitals Screenings to include cognitive, depression, and falls Referrals and appointments  In addition, I have reviewed and discussed with patient certain preventive protocols, quality metrics, and best practice recommendations. A written personalized care plan for preventive services as well as general preventive health recommendations were provided to patient.     Sydell Axon, LPN   10/22/863   After Visit Summary: (MyChart) Due to this  being a telephonic visit, the after visit summary with patients personalized plan was offered to patient via MyChart   Nurse Notes: see routing comments

## 2023-10-28 NOTE — Patient Instructions (Addendum)
Ms. Tiedt , Thank you for taking time to come for your Medicare Wellness Visit. I appreciate your ongoing commitment to your health goals. Please review the following plan we discussed and let me know if I can assist you in the future.   Referrals/Orders/Follow-Ups/Clinician Recommendations: Consider updating your vaccines. Remember to call and schedule your diabetic eye exam. Keep your upcoming appointment with Dr. Darrick Huntsman.  This is a list of the screening recommended for you and due dates:  Health Maintenance  Topic Date Due   COVID-19 Vaccine (1) Never done   Zoster (Shingles) Vaccine (1 of 2) Never done   DTaP/Tdap/Td vaccine (2 - Td or Tdap) 12/23/2015   Eye exam for diabetics  01/29/2017   Pneumonia Vaccine (3 of 3 - PPSV23 or PCV20) 07/31/2019   DEXA scan (bone density measurement)  Never done   Screening for Lung Cancer  01/18/2021   Complete foot exam   12/13/2021   Yearly kidney health urinalysis for diabetes  11/24/2023   Mammogram  07/07/2024*   Colon Cancer Screening  07/07/2024*   Hemoglobin A1C  01/04/2024   Yearly kidney function blood test for diabetes  09/03/2024   Medicare Annual Wellness Visit  10/27/2024   Flu Shot  Completed   Hepatitis C Screening  Completed   HPV Vaccine  Aged Out  *Topic was postponed. The date shown is not the original due date.    Advanced directives: (Declined) Advance directive discussed with you today. Even though you declined this today, please call our office should you change your mind, and we can give you the proper paperwork for you to fill out.  Next Medicare Annual Wellness Visit scheduled for next year: Yes 10/31/24 @ 3:40

## 2023-11-02 DIAGNOSIS — Z79899 Other long term (current) drug therapy: Secondary | ICD-10-CM | POA: Diagnosis not present

## 2023-11-02 DIAGNOSIS — J449 Chronic obstructive pulmonary disease, unspecified: Secondary | ICD-10-CM | POA: Diagnosis not present

## 2023-11-02 DIAGNOSIS — C439 Malignant melanoma of skin, unspecified: Secondary | ICD-10-CM | POA: Diagnosis not present

## 2023-11-02 DIAGNOSIS — R9402 Abnormal brain scan: Secondary | ICD-10-CM | POA: Diagnosis not present

## 2023-11-02 DIAGNOSIS — C4359 Malignant melanoma of other part of trunk: Secondary | ICD-10-CM | POA: Diagnosis not present

## 2023-11-02 DIAGNOSIS — R948 Abnormal results of function studies of other organs and systems: Secondary | ICD-10-CM | POA: Diagnosis not present

## 2023-11-02 DIAGNOSIS — C7800 Secondary malignant neoplasm of unspecified lung: Secondary | ICD-10-CM | POA: Diagnosis not present

## 2023-11-06 DIAGNOSIS — A000 Cholera due to Vibrio cholerae 01, biovar cholerae: Secondary | ICD-10-CM | POA: Diagnosis not present

## 2023-11-12 DIAGNOSIS — C4359 Malignant melanoma of other part of trunk: Secondary | ICD-10-CM | POA: Diagnosis not present

## 2023-11-12 DIAGNOSIS — C7800 Secondary malignant neoplasm of unspecified lung: Secondary | ICD-10-CM | POA: Diagnosis not present

## 2023-11-16 DIAGNOSIS — C773 Secondary and unspecified malignant neoplasm of axilla and upper limb lymph nodes: Secondary | ICD-10-CM | POA: Diagnosis not present

## 2023-11-16 DIAGNOSIS — Z7985 Long-term (current) use of injectable non-insulin antidiabetic drugs: Secondary | ICD-10-CM | POA: Diagnosis not present

## 2023-11-16 DIAGNOSIS — Z7984 Long term (current) use of oral hypoglycemic drugs: Secondary | ICD-10-CM | POA: Diagnosis not present

## 2023-11-16 DIAGNOSIS — Z87891 Personal history of nicotine dependence: Secondary | ICD-10-CM | POA: Diagnosis not present

## 2023-11-16 DIAGNOSIS — Z79891 Long term (current) use of opiate analgesic: Secondary | ICD-10-CM | POA: Diagnosis not present

## 2023-11-16 DIAGNOSIS — Z90710 Acquired absence of both cervix and uterus: Secondary | ICD-10-CM | POA: Diagnosis not present

## 2023-11-16 DIAGNOSIS — E119 Type 2 diabetes mellitus without complications: Secondary | ICD-10-CM | POA: Diagnosis not present

## 2023-11-16 DIAGNOSIS — J449 Chronic obstructive pulmonary disease, unspecified: Secondary | ICD-10-CM | POA: Diagnosis not present

## 2023-11-16 DIAGNOSIS — Z5112 Encounter for antineoplastic immunotherapy: Secondary | ICD-10-CM | POA: Diagnosis not present

## 2023-11-16 DIAGNOSIS — R262 Difficulty in walking, not elsewhere classified: Secondary | ICD-10-CM | POA: Diagnosis not present

## 2023-11-16 DIAGNOSIS — I10 Essential (primary) hypertension: Secondary | ICD-10-CM | POA: Diagnosis not present

## 2023-11-16 DIAGNOSIS — Z794 Long term (current) use of insulin: Secondary | ICD-10-CM | POA: Diagnosis not present

## 2023-11-16 DIAGNOSIS — Z1211 Encounter for screening for malignant neoplasm of colon: Secondary | ICD-10-CM | POA: Diagnosis not present

## 2023-11-16 DIAGNOSIS — Z7982 Long term (current) use of aspirin: Secondary | ICD-10-CM | POA: Diagnosis not present

## 2023-11-16 DIAGNOSIS — C439 Malignant melanoma of skin, unspecified: Secondary | ICD-10-CM | POA: Diagnosis not present

## 2023-11-16 DIAGNOSIS — Z79899 Other long term (current) drug therapy: Secondary | ICD-10-CM | POA: Diagnosis not present

## 2023-11-17 ENCOUNTER — Telehealth: Payer: Self-pay

## 2023-11-17 NOTE — Telephone Encounter (Signed)
 FYI

## 2023-11-17 NOTE — Telephone Encounter (Signed)
 Copied from CRM [id number]. Topic: Clinical - Medical Advice >> Nov 17, 2023 10:06 AM Adaysia C wrote: Reason for CRM: Patient called to inform the provider that she started her infusion for stage 3 melanoma on 11/16/2023 and she also linked her charts together.

## 2023-11-18 DIAGNOSIS — C4359 Malignant melanoma of other part of trunk: Secondary | ICD-10-CM | POA: Diagnosis not present

## 2023-11-20 ENCOUNTER — Other Ambulatory Visit: Payer: Self-pay | Admitting: Internal Medicine

## 2023-11-24 DIAGNOSIS — G839 Paralytic syndrome, unspecified: Secondary | ICD-10-CM | POA: Diagnosis not present

## 2023-11-30 ENCOUNTER — Telehealth: Payer: Self-pay

## 2023-11-30 DIAGNOSIS — Z7951 Long term (current) use of inhaled steroids: Secondary | ICD-10-CM | POA: Diagnosis not present

## 2023-11-30 DIAGNOSIS — Z87891 Personal history of nicotine dependence: Secondary | ICD-10-CM | POA: Diagnosis not present

## 2023-11-30 DIAGNOSIS — Z79899 Other long term (current) drug therapy: Secondary | ICD-10-CM | POA: Diagnosis not present

## 2023-11-30 DIAGNOSIS — I10 Essential (primary) hypertension: Secondary | ICD-10-CM | POA: Diagnosis not present

## 2023-11-30 DIAGNOSIS — E119 Type 2 diabetes mellitus without complications: Secondary | ICD-10-CM | POA: Diagnosis not present

## 2023-11-30 DIAGNOSIS — Z1211 Encounter for screening for malignant neoplasm of colon: Secondary | ICD-10-CM | POA: Diagnosis not present

## 2023-11-30 DIAGNOSIS — Z7982 Long term (current) use of aspirin: Secondary | ICD-10-CM | POA: Diagnosis not present

## 2023-11-30 DIAGNOSIS — J449 Chronic obstructive pulmonary disease, unspecified: Secondary | ICD-10-CM | POA: Diagnosis not present

## 2023-11-30 DIAGNOSIS — Z794 Long term (current) use of insulin: Secondary | ICD-10-CM | POA: Diagnosis not present

## 2023-11-30 DIAGNOSIS — R5383 Other fatigue: Secondary | ICD-10-CM | POA: Diagnosis not present

## 2023-11-30 DIAGNOSIS — C439 Malignant melanoma of skin, unspecified: Secondary | ICD-10-CM | POA: Diagnosis not present

## 2023-11-30 DIAGNOSIS — Z7984 Long term (current) use of oral hypoglycemic drugs: Secondary | ICD-10-CM | POA: Diagnosis not present

## 2023-11-30 DIAGNOSIS — Z7985 Long-term (current) use of injectable non-insulin antidiabetic drugs: Secondary | ICD-10-CM | POA: Diagnosis not present

## 2023-11-30 NOTE — Telephone Encounter (Signed)
 Patient assistance medications: 4 boxes of Ozempic 2 boxes of pen needles Patient is aware the medications are ready for pick up

## 2023-12-03 ENCOUNTER — Telehealth: Payer: Self-pay

## 2023-12-03 NOTE — Telephone Encounter (Signed)
 Spoke with pt to let her know that we have received her Evaristo Bury in the office and it is ready for pick up. Pt stated that her husband would come by and pick it up.   Evaristo Bury: 3 boxes

## 2023-12-04 NOTE — Telephone Encounter (Signed)
Medication has been picked up.  

## 2023-12-06 DIAGNOSIS — A000 Cholera due to Vibrio cholerae 01, biovar cholerae: Secondary | ICD-10-CM | POA: Diagnosis not present

## 2023-12-08 ENCOUNTER — Encounter: Payer: Self-pay | Admitting: Internal Medicine

## 2023-12-08 ENCOUNTER — Ambulatory Visit (INDEPENDENT_AMBULATORY_CARE_PROVIDER_SITE_OTHER): Payer: Medicare HMO | Admitting: Internal Medicine

## 2023-12-08 VITALS — BP 152/80 | HR 115 | Ht 63.0 in | Wt 245.0 lb

## 2023-12-08 DIAGNOSIS — E559 Vitamin D deficiency, unspecified: Secondary | ICD-10-CM

## 2023-12-08 DIAGNOSIS — E118 Type 2 diabetes mellitus with unspecified complications: Secondary | ICD-10-CM | POA: Diagnosis not present

## 2023-12-08 DIAGNOSIS — Z7985 Long-term (current) use of injectable non-insulin antidiabetic drugs: Secondary | ICD-10-CM | POA: Diagnosis not present

## 2023-12-08 DIAGNOSIS — R809 Proteinuria, unspecified: Secondary | ICD-10-CM

## 2023-12-08 DIAGNOSIS — E782 Mixed hyperlipidemia: Secondary | ICD-10-CM | POA: Diagnosis not present

## 2023-12-08 DIAGNOSIS — I10 Essential (primary) hypertension: Secondary | ICD-10-CM

## 2023-12-08 DIAGNOSIS — C4359 Malignant melanoma of other part of trunk: Secondary | ICD-10-CM

## 2023-12-08 DIAGNOSIS — E119 Type 2 diabetes mellitus without complications: Secondary | ICD-10-CM

## 2023-12-08 DIAGNOSIS — R1084 Generalized abdominal pain: Secondary | ICD-10-CM | POA: Diagnosis not present

## 2023-12-08 DIAGNOSIS — E1129 Type 2 diabetes mellitus with other diabetic kidney complication: Secondary | ICD-10-CM

## 2023-12-08 MED ORDER — PAROXETINE HCL 20 MG PO TABS: ORAL_TABLET | ORAL | 1 refills | Status: DC

## 2023-12-08 MED ORDER — AMLODIPINE BESYLATE 2.5 MG PO TABS: 2.5000 mg | ORAL_TABLET | Freq: Every day | ORAL | 1 refills | Status: DC

## 2023-12-08 NOTE — Assessment & Plan Note (Signed)
 Improved control since  last visit in October .  Advised to continue 2 untis Tresiba,  take 8 units novolog at dinner when skipping meal,  and Reduce breakfast  dose to 12 units,  and reduce  lunchtime insulin dose  to 22 units   Lab Results  Component Value Date   HGBA1C 8.2 (H) 07/06/2023

## 2023-12-08 NOTE — Patient Instructions (Addendum)
 Your diabetes control has improved!   To avoid the   really high highs and the lows:  When you skip dinner, give yourself a dose of 8 units of novolog.    Reduce breakfast  dose to 12 units,  and reduce  lunchtime insulin dose  to 22 units   So your  new mealtime regimen is:    12      22        25  ( or 8 if you skip dinner)  No change to the Tresiba (36 units)   2) Your BP is above goal .  Adding amlodipine 2.5 mg to start.  Continue toprol 25 mg and losarta 100 mg   Check BP in 2 weeks , send me 5 readings via mychart   3)  increase your oxygen rate to 5 L/min when you are leaving the house and continue 4 L/min at rest

## 2023-12-08 NOTE — Progress Notes (Unsigned)
 Subjective:  Patient ID: Catherine [name], female    DOB: December 18, 1955  Age: 68 y.o. MRN: [id number]  CC: The primary encounter diagnosis was Primary hypertension. Diagnoses of Type 2 diabetes mellitus with unspecified complications (HCC), Mixed hyperlipidemia, Generalized abdominal pain, Vitamin D deficiency, Diabetes mellitus treated with injections of non-insulin medication (HCC), Malignant melanoma of back (HCC), and Microalbuminuria due to type 2 diabetes mellitus (HCC) were also pertinent to this visit.   HPI Catherine [name] presents for  Chief Complaint  Patient presents with   Medical Management of Chronic Issues    Follow up on diabetes  Since her last in house visit she has begun chemotherapy for melanoma.  The treatment has made her lethargic and weak, ,and she spends most of her days napping ,  between meals.    1) uncontrolled type 2 DM by October 2024 A1c of 8.2  currenty taking metformin , basal insulin, Ozempic and sliding scale Novolog.  I have downloaded and reviewed the data from patient's continuous blood glucose monitor fgor the period of March 19 to April 1 . Patient's  sugars have been  IN RANGE  58   % OF THE TIME,   BELOW RANGE   0 % of the time.  And ABOVE RANGE 42  % OF THE TIME .  Medication doses during this period  are as follows: Tresiba 36 units once daily  ,  Novolog 15 ,  25  and 25 Qac.  Metformin 1000 mg bid and Ozempic 1 mg weekly .  She has been omitting the evening dose of novolog if she skips a meal , which results in elevated readings from 7 pm to 4 am   2)  Has been having intermittent abd pain for the past week . Not related to eating.  Occasional  nausea  but no vomiting  using phenergan 2-3 times per week   3)  Morning headaches ,occurring daily.  Managed with prn tylenol.  She has  untreated sleep apnea  but wears 4 L 02 at night due to chronic hypoxemic respiratory failure   Outpatient Medications Prior to Visit  Medication Sig Dispense Refill    albuterol (VENTOLIN HFA) 108 (90 Base) MCG/ACT inhaler INHALE 1-2 PUFFS BY MOUTH EVERY 6 HOURS AS NEEDED FOR WHEEZE OR SHORTNESS OF BREATH 18 each 3   ALPRAZolam (XANAX) 0.25 MG tablet Take 1 tablet (0.25 mg total) by mouth 2 (two) times daily as needed for anxiety or sleep. 45 tablet 5   aspirin 81 MG EC tablet Take 81 mg by mouth daily.     Budeson-Glycopyrrol-Formoterol (BREZTRI AEROSPHERE) 160-9-4.8 MCG/ACT AERO Inhale 2 puffs into the lungs 2 (two) times daily. 32.1 g 3   Cholecalciferol (VITAMIN D3) 125 MCG (5000 UT) CAPS Take 1,000 Units by mouth daily.      Continuous Glucose Sensor (FREESTYLE LIBRE 3 SENSOR) MISC Place 1 sensor on the skin every 14 days. Use to check glucose continuously     cyclobenzaprine (FLEXERIL) 5 MG tablet TAKE 1 TABLET BY MOUTH THREE TIMES A DAY AS NEEDED FOR MUSCLE SPASM 90 tablet 1   insulin aspart (NOVOLOG FLEXPEN) 100 UNIT/ML FlexPen Inject 20 Units into the skin 3 (three) times daily with meals. (Patient taking differently: Inject 20 Units into the skin 3 (three) times daily with meals. 15 units in the morning, 25 units in the afternoon, 25 units in the evening)     insulin degludec (TRESIBA FLEXTOUCH) 200 UNIT/ML FlexTouch Pen Inject 34 Units into  the skin daily. (Patient taking differently: Inject 36 Units into the skin daily.)     ipratropium-albuterol (DUONEB) 0.5-2.5 (3) MG/3ML SOLN Take 3 mLs by nebulization every 6 (six) hours as needed. 360 mL 3   lactulose (CHRONULAC) 10 GM/15ML solution TAKE 30 ML EVERY 4 HOURS UNTIL CONSTIPATION IS RELIEVED 473 mL 1   losartan (COZAAR) 100 MG tablet TAKE 1 TABLET BY MOUTH EVERY DAY 90 tablet 1   Magnesium Citrate (MAGNESIUM GUMMIES PO) Take by mouth. Chews two a day     Melatonin 5 MG TABS Take 5 mg by mouth at bedtime.     metFORMIN (GLUCOPHAGE) 1000 MG tablet TAKE 1 TABLET (1,000 MG TOTAL) BY MOUTH TWICE A DAY WITH FOOD 180 tablet 1   metoprolol succinate (TOPROL-XL) 25 MG 24 hr tablet TAKE 1 TABLET BY MOUTH EVERY  DAY 90 tablet 3   polyethylene glycol powder (GLYCOLAX/MIRALAX) 17 GM/SCOOP powder Take 1 Container by mouth 2 (two) times daily.     Potassium 99 MG TABS Take 99 mg by mouth daily. Reported on 09/20/2015     Probiotic Product (PROBIOTIC DAILY PO) Take by mouth daily.     promethazine (PHENERGAN) 12.5 MG tablet TAKE 1 TABLET (12.5 MG TOTAL) BY MOUTH AT BEDTIME AS NEEDED FOR NAUSEA OR VOMITING. 30 tablet 0   rosuvastatin (CRESTOR) 40 MG tablet TAKE 1 TABLET BY MOUTH EVERY DAY 90 tablet 3   Semaglutide, 1 MG/DOSE, 4 MG/3ML SOPN Inject 1 mg as directed once a week.     PARoxetine (PAXIL) 20 MG tablet TAKE 1 TABLET BY MOUTH EVERY DAY IN THE MORNING 90 tablet 1   No facility-administered medications prior to visit.    Review of Systems;  Patient denies  fevers, malaise, unintentional weight loss, skin rash, eye pain, sinus congestion and sinus pain, sore throat, dysphagia,  hemoptysis , cough, dyspnea, wheezing, chest pain, palpitations, orthopnea, edema, , melena, diarrhea, constipation, flank pain, dysuria, hematuria, urinary  Frequency, nocturia, numbness, tingling, seizures,  Focal weakness, Loss of consciousness,  Tremor, insomnia, depression, anxiety, and suicidal ideation.      Objective:  BP (!) 152/80   Pulse (!) 115   Ht 5\' 3"  (1.6 m)   Wt 245 lb (111.1 kg)   SpO2 95% Comment: 4 L O2  BMI 43.40 kg/m   BP Readings from Last 3 Encounters:  12/08/23 (!) 152/80  09/04/23 138/70  09/02/23 (!) 114/59    Wt Readings from Last 3 Encounters:  12/08/23 245 lb (111.1 kg)  10/28/23 248 lb (112.5 kg)  09/04/23 252 lb (114.3 kg)    Physical Exam Vitals reviewed.  Constitutional:      General: She is not in acute distress.    Appearance: Normal appearance. She is obese. She is not ill-appearing, toxic-appearing or diaphoretic.     Interventions: Nasal cannula in place.  HENT:     Head: Normocephalic.  Eyes:     General: No scleral icterus.       Right eye: No discharge.         Left eye: No discharge.     Conjunctiva/sclera: Conjunctivae normal.  Cardiovascular:     Rate and Rhythm: Normal rate and regular rhythm.     Heart sounds: Normal heart sounds.  Pulmonary:     Effort: Pulmonary effort is normal. No respiratory distress.     Breath sounds: Normal breath sounds.  Musculoskeletal:        General: Normal range of motion.  Skin:  General: Skin is warm and dry.  Neurological:     General: No focal deficit present.     Mental Status: She is alert and oriented to person, place, and time. Mental status is at baseline.  Psychiatric:        Mood and Affect: Mood normal.        Behavior: Behavior normal.        Thought Content: Thought content normal.        Judgment: Judgment normal.    Lab Results  Component Value Date   HGBA1C 8.2 (H) 07/06/2023   HGBA1C 7.8 (A) 11/24/2022   HGBA1C 8.2 (H) 07/11/2022    Lab Results  Component Value Date   CREATININE 1.26 (H) 09/04/2023   CREATININE 1.43 (H) 09/02/2023   CREATININE 1.28 (H) 09/02/2023    Lab Results  Component Value Date   WBC 12.7 (H) 09/01/2023   HGB 11.2 (L) 09/01/2023   HCT 35.5 (L) 09/01/2023   PLT 334 09/01/2023   GLUCOSE 184 (H) 09/04/2023   CHOL 116 07/06/2023   TRIG 170.0 (H) 07/06/2023   HDL 37.60 (L) 07/06/2023   LDLDIRECT 57.0 07/06/2023   LDLCALC 45 07/06/2023   ALT 16 09/04/2023   AST 41 (H) 09/04/2023   NA 142 09/04/2023   K 3.3 (L) 09/04/2023   CL 99 09/04/2023   CREATININE 1.26 (H) 09/04/2023   BUN 19 09/04/2023   CO2 33 (H) 09/04/2023   TSH 1.10 01/12/2020   INR 0.9 07/22/2012   HGBA1C 8.2 (H) 07/06/2023   MICROALBUR 2.5 (H) 11/24/2022    DG Chest 1 View Result Date: 09/01/2023 CLINICAL DATA:  Hypokalemia. EXAM: CHEST  1 VIEW COMPARISON:  June 19, 2022. FINDINGS: The heart size and mediastinal contours are within normal limits. Both lungs are clear. The visualized skeletal structures are unremarkable. IMPRESSION: No active disease. Electronically Signed    By: Lupita Raider M.D.   On: 09/01/2023 14:36    Assessment & Plan:  .Primary hypertension Assessment & Plan: Adding 2.5 mg amlodipine today ,  continue olosartan 100 mg and toprol xl 25 mg   Lab Results  Component Value Date   MICROALBUR 2.5 (H) 11/24/2022   MICROALBUR 6.0 (H) 03/04/2022       Orders: -     Microalbumin / creatinine urine ratio  Type 2 diabetes mellitus with unspecified complications (HCC) -     POCT glycosylated hemoglobin (Hb A1C) -     Microalbumin / creatinine urine ratio  Mixed hyperlipidemia -     Lipid panel -     LDL cholesterol, direct  Generalized abdominal pain -     Lipase  Vitamin D deficiency -     VITAMIN D 25 Hydroxy (Vit-D Deficiency, Fractures)  Diabetes mellitus treated with injections of non-insulin medication (HCC) Assessment & Plan: Continue Ozempic , no dose titration given recurrent nausea   Malignant melanoma of back [facility]) Assessment & Plan: PET scan suggested spread to lymph nodes m in Feb 2025 [facility], [place]) but she was considered too high risk for biopsy.  Per [facility]: "The standard of care for stage IV melanoma patients without a contraindication to ipilimumab and nivolumab is ipi/nivo based on the CHECKMATE 067 trial and subsequent DREAMSeq Study which in patients with the BRAF V600 E/K mutation, leading with ICI is preferred to leading with BRAF/MEK inhibitors. But this pt is not a good candidate for dual ICI dt her her performance status and COPD. She could have a difficult time  if she had a severe irAE.  The IHC for BRAF came back right after the consultation and they are weakly +. I do think it best to lead w PD 1 inhibitor. If she she does not respond to Rx we will get a full molecular test for confirmation.   The next steps in the patient's care are to obtain an MRI brain. If + for brain mets, the plan will change.  Otherwise we will plan on 2 to 3 mos of q 2 week nivo 240mg  followed by PET scan. If she has CR, stop. If  she has a PR and is otherwise ok, we can consider stopping. If she has a PR we could also consider continuing.     Microalbuminuria due to type 2 diabetes mellitus (HCC) Assessment & Plan: . She is taking an ACE Inhibitor   but has persistent  proteinuria.  She has DECLINED SGLT 2 inhibitor.   Lab Results  Component Value Date   MICROALBUR 2.5 (H) 11/24/2022   MICROALBUR 6.0 (H) 03/04/2022    '   Other orders -     PARoxetine HCl; TAKE 1 TABLET BY MOUTH EVERY DAY IN THE MORNING  Dispense: 90 tablet; Refill: 1 -     amLODIPine Besylate; Take 1 tablet (2.5 mg total) by mouth daily.  Dispense: 90 tablet; Refill: 1     I spent  40 minutes on the day of this face to face encounter reviewing patient's  most recent visit with  Surgical oncology,   prior relevant surgical and non surgical procedures, recent  labs and imaging studies, counseling on diabetes management,  reviewing the assessment and plan with patient, and post visit ordering and reviewing of  diagnostics and therapeutics with patient  .   Follow-up: Return in about 3 months (around 03/08/2024) for follow up diabetes.   Sherlene Shams, MD

## 2023-12-09 LAB — LIPASE: Lipase: 49 U/L (ref 11.0–59.0)

## 2023-12-09 LAB — POCT GLYCOSYLATED HEMOGLOBIN (HGB A1C): Hemoglobin A1C: 7.7 % — AB (ref 4.0–5.6)

## 2023-12-09 LAB — LIPID PANEL
Cholesterol: 112 mg/dL (ref 0–200)
HDL: 37.8 mg/dL — ABNORMAL LOW (ref >39.00)
LDL Cholesterol: 46 mg/dL (ref 0–99)
NonHDL: 74.25
Total CHOL/HDL Ratio: 3
Triglycerides: 143 mg/dL (ref 0.0–149.0)
VLDL: 28.6 mg/dL (ref 0.0–40.0)

## 2023-12-09 LAB — MICROALBUMIN / CREATININE URINE RATIO
Creatinine,U: 120.8 mg/dL
Microalb Creat Ratio: 198.9 mg/g — ABNORMAL HIGH (ref 0.0–30.0)
Microalb, Ur: 24 mg/dL — ABNORMAL HIGH (ref 0.0–1.9)

## 2023-12-09 LAB — LDL CHOLESTEROL, DIRECT: Direct LDL: 58 mg/dL

## 2023-12-09 LAB — VITAMIN D 25 HYDROXY (VIT D DEFICIENCY, FRACTURES): VITD: 50.98 ng/mL (ref 30.00–100.00)

## 2023-12-09 NOTE — Assessment & Plan Note (Signed)
 Adding 2.5 mg amlodipine today ,  continue olosartan 100 mg and toprol xl 25 mg   Lab Results  Component Value Date   MICROALBUR 2.5 (H) 11/24/2022   MICROALBUR 6.0 (H) 03/04/2022

## 2023-12-09 NOTE — Assessment & Plan Note (Addendum)
.   She is taking an ACE Inhibitor   but has persistent  proteinuria.  She has DECLINED SGLT 2 inhibitor in the past but Is willing to try Jardiance now.  20 mg rx sent   Lab Results  Component Value Date   MICROALBUR 2.5 (H) 11/24/2022   MICROALBUR 6.0 (H) 03/04/2022    '

## 2023-12-09 NOTE — Assessment & Plan Note (Addendum)
 PET scan suggested spread to lymph nodes m in Feb 2025 [facility], [place]) but she was considered too high risk for biopsy.  Per [facility]: "The standard of care for stage IV melanoma patients without a contraindication to ipilimumab and nivolumab is ipi/nivo based on the CHECKMATE 067 trial and subsequent DREAMSeq Study which in patients with the BRAF V600 E/K mutation, leading with ICI is preferred to leading with BRAF/MEK inhibitors. But this pt is not a good candidate for dual ICI dt her her performance status and COPD. She could have a difficult time if she had a severe irAE.  The IHC for BRAF came back right after the consultation and they are weakly +. I do think it best to lead w PD 1 inhibitor. If she she does not respond to Rx we will get a full molecular test for confirmation.   The next steps in the patient's care are to obtain an MRI brain. If + for brain mets, the plan will change.  Otherwise we will plan on 2 to 3 mos of q 2 week nivo 240mg  followed by PET scan. If she has CR, stop. If she has a PR and is otherwise ok, we can consider stopping. If she has a PR we could also consider continuing.

## 2023-12-09 NOTE — Assessment & Plan Note (Signed)
 Continue Ozempic , no dose titration given recurrent nausea

## 2023-12-10 ENCOUNTER — Encounter: Payer: Self-pay | Admitting: Internal Medicine

## 2023-12-11 NOTE — Progress Notes (Signed)
 Left message for patient to give our office a call back to discuss recent labs results and recommendations.   OK to relay message if patient calls back. If relayed, please notify the office.

## 2023-12-14 DIAGNOSIS — C779 Secondary and unspecified malignant neoplasm of lymph node, unspecified: Secondary | ICD-10-CM | POA: Diagnosis not present

## 2023-12-14 DIAGNOSIS — Z5112 Encounter for antineoplastic immunotherapy: Secondary | ICD-10-CM | POA: Diagnosis not present

## 2023-12-14 DIAGNOSIS — C439 Malignant melanoma of skin, unspecified: Secondary | ICD-10-CM | POA: Diagnosis not present

## 2023-12-14 DIAGNOSIS — Z87891 Personal history of nicotine dependence: Secondary | ICD-10-CM | POA: Diagnosis not present

## 2023-12-14 DIAGNOSIS — J449 Chronic obstructive pulmonary disease, unspecified: Secondary | ICD-10-CM | POA: Diagnosis not present

## 2023-12-14 DIAGNOSIS — K5900 Constipation, unspecified: Secondary | ICD-10-CM | POA: Diagnosis not present

## 2023-12-14 DIAGNOSIS — Z510 Encounter for antineoplastic radiation therapy: Secondary | ICD-10-CM | POA: Diagnosis not present

## 2023-12-14 DIAGNOSIS — Z7982 Long term (current) use of aspirin: Secondary | ICD-10-CM | POA: Diagnosis not present

## 2023-12-14 DIAGNOSIS — I10 Essential (primary) hypertension: Secondary | ICD-10-CM | POA: Diagnosis not present

## 2023-12-14 DIAGNOSIS — Z7985 Long-term (current) use of injectable non-insulin antidiabetic drugs: Secondary | ICD-10-CM | POA: Diagnosis not present

## 2023-12-14 DIAGNOSIS — Z7984 Long term (current) use of oral hypoglycemic drugs: Secondary | ICD-10-CM | POA: Diagnosis not present

## 2023-12-14 DIAGNOSIS — Z794 Long term (current) use of insulin: Secondary | ICD-10-CM | POA: Diagnosis not present

## 2023-12-14 DIAGNOSIS — E119 Type 2 diabetes mellitus without complications: Secondary | ICD-10-CM | POA: Diagnosis not present

## 2023-12-14 DIAGNOSIS — Z79899 Other long term (current) drug therapy: Secondary | ICD-10-CM | POA: Diagnosis not present

## 2023-12-14 DIAGNOSIS — C4359 Malignant melanoma of other part of trunk: Secondary | ICD-10-CM | POA: Diagnosis not present

## 2023-12-15 ENCOUNTER — Encounter: Payer: Self-pay | Admitting: Internal Medicine

## 2023-12-16 MED ORDER — EMPAGLIFLOZIN 10 MG PO TABS: 10.0000 mg | ORAL_TABLET | Freq: Every day | ORAL | 2 refills | Status: DC

## 2023-12-16 NOTE — Addendum Note (Signed)
 Addended by: Sherlene Shams on: 12/16/2023 11:24 AM   Modules accepted: Orders

## 2023-12-21 DIAGNOSIS — E119 Type 2 diabetes mellitus without complications: Secondary | ICD-10-CM | POA: Diagnosis not present

## 2023-12-21 DIAGNOSIS — K5900 Constipation, unspecified: Secondary | ICD-10-CM | POA: Diagnosis not present

## 2023-12-21 DIAGNOSIS — Z7982 Long term (current) use of aspirin: Secondary | ICD-10-CM | POA: Diagnosis not present

## 2023-12-21 DIAGNOSIS — C779 Secondary and unspecified malignant neoplasm of lymph node, unspecified: Secondary | ICD-10-CM | POA: Diagnosis not present

## 2023-12-21 DIAGNOSIS — Z87891 Personal history of nicotine dependence: Secondary | ICD-10-CM | POA: Diagnosis not present

## 2023-12-21 DIAGNOSIS — Z7984 Long term (current) use of oral hypoglycemic drugs: Secondary | ICD-10-CM | POA: Diagnosis not present

## 2023-12-21 DIAGNOSIS — Z5112 Encounter for antineoplastic immunotherapy: Secondary | ICD-10-CM | POA: Diagnosis not present

## 2023-12-21 DIAGNOSIS — Z79899 Other long term (current) drug therapy: Secondary | ICD-10-CM | POA: Diagnosis not present

## 2023-12-21 DIAGNOSIS — J449 Chronic obstructive pulmonary disease, unspecified: Secondary | ICD-10-CM | POA: Diagnosis not present

## 2023-12-21 DIAGNOSIS — Z794 Long term (current) use of insulin: Secondary | ICD-10-CM | POA: Diagnosis not present

## 2023-12-21 DIAGNOSIS — Z510 Encounter for antineoplastic radiation therapy: Secondary | ICD-10-CM | POA: Diagnosis not present

## 2023-12-21 DIAGNOSIS — Z7985 Long-term (current) use of injectable non-insulin antidiabetic drugs: Secondary | ICD-10-CM | POA: Diagnosis not present

## 2023-12-21 DIAGNOSIS — C439 Malignant melanoma of skin, unspecified: Secondary | ICD-10-CM | POA: Diagnosis not present

## 2023-12-21 DIAGNOSIS — C4359 Malignant melanoma of other part of trunk: Secondary | ICD-10-CM | POA: Diagnosis not present

## 2023-12-21 DIAGNOSIS — I10 Essential (primary) hypertension: Secondary | ICD-10-CM | POA: Diagnosis not present

## 2023-12-24 ENCOUNTER — Other Ambulatory Visit: Payer: Self-pay | Admitting: Family Medicine

## 2023-12-24 DIAGNOSIS — E119 Type 2 diabetes mellitus without complications: Secondary | ICD-10-CM | POA: Diagnosis not present

## 2023-12-24 DIAGNOSIS — Z7985 Long-term (current) use of injectable non-insulin antidiabetic drugs: Secondary | ICD-10-CM | POA: Diagnosis not present

## 2023-12-24 DIAGNOSIS — J9611 Chronic respiratory failure with hypoxia: Secondary | ICD-10-CM

## 2023-12-24 DIAGNOSIS — Z5112 Encounter for antineoplastic immunotherapy: Secondary | ICD-10-CM | POA: Diagnosis not present

## 2023-12-24 DIAGNOSIS — C439 Malignant melanoma of skin, unspecified: Secondary | ICD-10-CM | POA: Diagnosis not present

## 2023-12-24 DIAGNOSIS — Z794 Long term (current) use of insulin: Secondary | ICD-10-CM | POA: Diagnosis not present

## 2023-12-24 DIAGNOSIS — C4359 Malignant melanoma of other part of trunk: Secondary | ICD-10-CM | POA: Diagnosis not present

## 2023-12-24 DIAGNOSIS — Z7984 Long term (current) use of oral hypoglycemic drugs: Secondary | ICD-10-CM | POA: Diagnosis not present

## 2023-12-24 DIAGNOSIS — Z7982 Long term (current) use of aspirin: Secondary | ICD-10-CM | POA: Diagnosis not present

## 2023-12-24 DIAGNOSIS — Z79899 Other long term (current) drug therapy: Secondary | ICD-10-CM | POA: Diagnosis not present

## 2023-12-24 DIAGNOSIS — K5900 Constipation, unspecified: Secondary | ICD-10-CM | POA: Diagnosis not present

## 2023-12-24 DIAGNOSIS — J449 Chronic obstructive pulmonary disease, unspecified: Secondary | ICD-10-CM | POA: Diagnosis not present

## 2023-12-24 DIAGNOSIS — C779 Secondary and unspecified malignant neoplasm of lymph node, unspecified: Secondary | ICD-10-CM | POA: Diagnosis not present

## 2023-12-24 DIAGNOSIS — Z510 Encounter for antineoplastic radiation therapy: Secondary | ICD-10-CM | POA: Diagnosis not present

## 2023-12-24 DIAGNOSIS — I10 Essential (primary) hypertension: Secondary | ICD-10-CM | POA: Diagnosis not present

## 2023-12-24 DIAGNOSIS — Z87891 Personal history of nicotine dependence: Secondary | ICD-10-CM | POA: Diagnosis not present

## 2023-12-25 DIAGNOSIS — G839 Paralytic syndrome, unspecified: Secondary | ICD-10-CM | POA: Diagnosis not present

## 2023-12-28 DIAGNOSIS — Z510 Encounter for antineoplastic radiation therapy: Secondary | ICD-10-CM | POA: Diagnosis not present

## 2023-12-28 DIAGNOSIS — C4359 Malignant melanoma of other part of trunk: Secondary | ICD-10-CM | POA: Diagnosis not present

## 2023-12-28 DIAGNOSIS — Z7985 Long-term (current) use of injectable non-insulin antidiabetic drugs: Secondary | ICD-10-CM | POA: Diagnosis not present

## 2023-12-28 DIAGNOSIS — Z7982 Long term (current) use of aspirin: Secondary | ICD-10-CM | POA: Diagnosis not present

## 2023-12-28 DIAGNOSIS — Z87891 Personal history of nicotine dependence: Secondary | ICD-10-CM | POA: Diagnosis not present

## 2023-12-28 DIAGNOSIS — I10 Essential (primary) hypertension: Secondary | ICD-10-CM | POA: Diagnosis not present

## 2023-12-28 DIAGNOSIS — E119 Type 2 diabetes mellitus without complications: Secondary | ICD-10-CM | POA: Diagnosis not present

## 2023-12-28 DIAGNOSIS — J449 Chronic obstructive pulmonary disease, unspecified: Secondary | ICD-10-CM | POA: Diagnosis not present

## 2023-12-28 DIAGNOSIS — Z79899 Other long term (current) drug therapy: Secondary | ICD-10-CM | POA: Diagnosis not present

## 2023-12-28 DIAGNOSIS — Z7984 Long term (current) use of oral hypoglycemic drugs: Secondary | ICD-10-CM | POA: Diagnosis not present

## 2023-12-28 DIAGNOSIS — Z5112 Encounter for antineoplastic immunotherapy: Secondary | ICD-10-CM | POA: Diagnosis not present

## 2023-12-28 DIAGNOSIS — K5900 Constipation, unspecified: Secondary | ICD-10-CM | POA: Diagnosis not present

## 2023-12-28 DIAGNOSIS — C439 Malignant melanoma of skin, unspecified: Secondary | ICD-10-CM | POA: Diagnosis not present

## 2023-12-28 DIAGNOSIS — C779 Secondary and unspecified malignant neoplasm of lymph node, unspecified: Secondary | ICD-10-CM | POA: Diagnosis not present

## 2023-12-28 DIAGNOSIS — Z794 Long term (current) use of insulin: Secondary | ICD-10-CM | POA: Diagnosis not present

## 2023-12-30 DIAGNOSIS — Z79899 Other long term (current) drug therapy: Secondary | ICD-10-CM | POA: Diagnosis not present

## 2023-12-30 DIAGNOSIS — Z7984 Long term (current) use of oral hypoglycemic drugs: Secondary | ICD-10-CM | POA: Diagnosis not present

## 2023-12-30 DIAGNOSIS — C779 Secondary and unspecified malignant neoplasm of lymph node, unspecified: Secondary | ICD-10-CM | POA: Diagnosis not present

## 2023-12-30 DIAGNOSIS — Z794 Long term (current) use of insulin: Secondary | ICD-10-CM | POA: Diagnosis not present

## 2023-12-30 DIAGNOSIS — J449 Chronic obstructive pulmonary disease, unspecified: Secondary | ICD-10-CM | POA: Diagnosis not present

## 2023-12-30 DIAGNOSIS — Z5112 Encounter for antineoplastic immunotherapy: Secondary | ICD-10-CM | POA: Diagnosis not present

## 2023-12-30 DIAGNOSIS — K5900 Constipation, unspecified: Secondary | ICD-10-CM | POA: Diagnosis not present

## 2023-12-30 DIAGNOSIS — C4359 Malignant melanoma of other part of trunk: Secondary | ICD-10-CM | POA: Diagnosis not present

## 2023-12-30 DIAGNOSIS — E119 Type 2 diabetes mellitus without complications: Secondary | ICD-10-CM | POA: Diagnosis not present

## 2023-12-30 DIAGNOSIS — Z7985 Long-term (current) use of injectable non-insulin antidiabetic drugs: Secondary | ICD-10-CM | POA: Diagnosis not present

## 2023-12-30 DIAGNOSIS — Z510 Encounter for antineoplastic radiation therapy: Secondary | ICD-10-CM | POA: Diagnosis not present

## 2023-12-30 DIAGNOSIS — Z87891 Personal history of nicotine dependence: Secondary | ICD-10-CM | POA: Diagnosis not present

## 2023-12-30 DIAGNOSIS — C439 Malignant melanoma of skin, unspecified: Secondary | ICD-10-CM | POA: Diagnosis not present

## 2023-12-30 DIAGNOSIS — I10 Essential (primary) hypertension: Secondary | ICD-10-CM | POA: Diagnosis not present

## 2023-12-30 DIAGNOSIS — Z7982 Long term (current) use of aspirin: Secondary | ICD-10-CM | POA: Diagnosis not present

## 2023-12-31 DIAGNOSIS — Z7984 Long term (current) use of oral hypoglycemic drugs: Secondary | ICD-10-CM | POA: Diagnosis not present

## 2023-12-31 DIAGNOSIS — C4359 Malignant melanoma of other part of trunk: Secondary | ICD-10-CM | POA: Diagnosis not present

## 2023-12-31 DIAGNOSIS — Z79899 Other long term (current) drug therapy: Secondary | ICD-10-CM | POA: Diagnosis not present

## 2023-12-31 DIAGNOSIS — Z794 Long term (current) use of insulin: Secondary | ICD-10-CM | POA: Diagnosis not present

## 2023-12-31 DIAGNOSIS — I10 Essential (primary) hypertension: Secondary | ICD-10-CM | POA: Diagnosis not present

## 2023-12-31 DIAGNOSIS — C779 Secondary and unspecified malignant neoplasm of lymph node, unspecified: Secondary | ICD-10-CM | POA: Diagnosis not present

## 2023-12-31 DIAGNOSIS — Z5112 Encounter for antineoplastic immunotherapy: Secondary | ICD-10-CM | POA: Diagnosis not present

## 2023-12-31 DIAGNOSIS — Z7985 Long-term (current) use of injectable non-insulin antidiabetic drugs: Secondary | ICD-10-CM | POA: Diagnosis not present

## 2023-12-31 DIAGNOSIS — J449 Chronic obstructive pulmonary disease, unspecified: Secondary | ICD-10-CM | POA: Diagnosis not present

## 2023-12-31 DIAGNOSIS — Z510 Encounter for antineoplastic radiation therapy: Secondary | ICD-10-CM | POA: Diagnosis not present

## 2023-12-31 DIAGNOSIS — E119 Type 2 diabetes mellitus without complications: Secondary | ICD-10-CM | POA: Diagnosis not present

## 2023-12-31 DIAGNOSIS — K5900 Constipation, unspecified: Secondary | ICD-10-CM | POA: Diagnosis not present

## 2023-12-31 DIAGNOSIS — Z87891 Personal history of nicotine dependence: Secondary | ICD-10-CM | POA: Diagnosis not present

## 2023-12-31 DIAGNOSIS — Z7982 Long term (current) use of aspirin: Secondary | ICD-10-CM | POA: Diagnosis not present

## 2024-01-01 DIAGNOSIS — Z510 Encounter for antineoplastic radiation therapy: Secondary | ICD-10-CM | POA: Diagnosis not present

## 2024-01-01 DIAGNOSIS — Z7985 Long-term (current) use of injectable non-insulin antidiabetic drugs: Secondary | ICD-10-CM | POA: Diagnosis not present

## 2024-01-01 DIAGNOSIS — Z5112 Encounter for antineoplastic immunotherapy: Secondary | ICD-10-CM | POA: Diagnosis not present

## 2024-01-01 DIAGNOSIS — Z794 Long term (current) use of insulin: Secondary | ICD-10-CM | POA: Diagnosis not present

## 2024-01-01 DIAGNOSIS — K5900 Constipation, unspecified: Secondary | ICD-10-CM | POA: Diagnosis not present

## 2024-01-01 DIAGNOSIS — Z87891 Personal history of nicotine dependence: Secondary | ICD-10-CM | POA: Diagnosis not present

## 2024-01-01 DIAGNOSIS — Z7982 Long term (current) use of aspirin: Secondary | ICD-10-CM | POA: Diagnosis not present

## 2024-01-01 DIAGNOSIS — E119 Type 2 diabetes mellitus without complications: Secondary | ICD-10-CM | POA: Diagnosis not present

## 2024-01-01 DIAGNOSIS — Z79899 Other long term (current) drug therapy: Secondary | ICD-10-CM | POA: Diagnosis not present

## 2024-01-01 DIAGNOSIS — C4359 Malignant melanoma of other part of trunk: Secondary | ICD-10-CM | POA: Diagnosis not present

## 2024-01-01 DIAGNOSIS — I10 Essential (primary) hypertension: Secondary | ICD-10-CM | POA: Diagnosis not present

## 2024-01-01 DIAGNOSIS — J449 Chronic obstructive pulmonary disease, unspecified: Secondary | ICD-10-CM | POA: Diagnosis not present

## 2024-01-01 DIAGNOSIS — C779 Secondary and unspecified malignant neoplasm of lymph node, unspecified: Secondary | ICD-10-CM | POA: Diagnosis not present

## 2024-01-01 DIAGNOSIS — Z7984 Long term (current) use of oral hypoglycemic drugs: Secondary | ICD-10-CM | POA: Diagnosis not present

## 2024-01-04 DIAGNOSIS — Z7985 Long-term (current) use of injectable non-insulin antidiabetic drugs: Secondary | ICD-10-CM | POA: Diagnosis not present

## 2024-01-04 DIAGNOSIS — Z510 Encounter for antineoplastic radiation therapy: Secondary | ICD-10-CM | POA: Diagnosis not present

## 2024-01-04 DIAGNOSIS — C779 Secondary and unspecified malignant neoplasm of lymph node, unspecified: Secondary | ICD-10-CM | POA: Diagnosis not present

## 2024-01-04 DIAGNOSIS — I10 Essential (primary) hypertension: Secondary | ICD-10-CM | POA: Diagnosis not present

## 2024-01-04 DIAGNOSIS — Z794 Long term (current) use of insulin: Secondary | ICD-10-CM | POA: Diagnosis not present

## 2024-01-04 DIAGNOSIS — E119 Type 2 diabetes mellitus without complications: Secondary | ICD-10-CM | POA: Diagnosis not present

## 2024-01-04 DIAGNOSIS — Z87891 Personal history of nicotine dependence: Secondary | ICD-10-CM | POA: Diagnosis not present

## 2024-01-04 DIAGNOSIS — Z7984 Long term (current) use of oral hypoglycemic drugs: Secondary | ICD-10-CM | POA: Diagnosis not present

## 2024-01-04 DIAGNOSIS — J449 Chronic obstructive pulmonary disease, unspecified: Secondary | ICD-10-CM | POA: Diagnosis not present

## 2024-01-04 DIAGNOSIS — Z5112 Encounter for antineoplastic immunotherapy: Secondary | ICD-10-CM | POA: Diagnosis not present

## 2024-01-04 DIAGNOSIS — K5900 Constipation, unspecified: Secondary | ICD-10-CM | POA: Diagnosis not present

## 2024-01-04 DIAGNOSIS — Z7982 Long term (current) use of aspirin: Secondary | ICD-10-CM | POA: Diagnosis not present

## 2024-01-04 DIAGNOSIS — Z79899 Other long term (current) drug therapy: Secondary | ICD-10-CM | POA: Diagnosis not present

## 2024-01-04 DIAGNOSIS — C4359 Malignant melanoma of other part of trunk: Secondary | ICD-10-CM | POA: Diagnosis not present

## 2024-01-05 DIAGNOSIS — E119 Type 2 diabetes mellitus without complications: Secondary | ICD-10-CM | POA: Diagnosis not present

## 2024-01-05 DIAGNOSIS — C4359 Malignant melanoma of other part of trunk: Secondary | ICD-10-CM | POA: Diagnosis not present

## 2024-01-05 DIAGNOSIS — Z5112 Encounter for antineoplastic immunotherapy: Secondary | ICD-10-CM | POA: Diagnosis not present

## 2024-01-05 DIAGNOSIS — Z7982 Long term (current) use of aspirin: Secondary | ICD-10-CM | POA: Diagnosis not present

## 2024-01-05 DIAGNOSIS — Z87891 Personal history of nicotine dependence: Secondary | ICD-10-CM | POA: Diagnosis not present

## 2024-01-05 DIAGNOSIS — J449 Chronic obstructive pulmonary disease, unspecified: Secondary | ICD-10-CM | POA: Diagnosis not present

## 2024-01-05 DIAGNOSIS — Z7985 Long-term (current) use of injectable non-insulin antidiabetic drugs: Secondary | ICD-10-CM | POA: Diagnosis not present

## 2024-01-05 DIAGNOSIS — K5900 Constipation, unspecified: Secondary | ICD-10-CM | POA: Diagnosis not present

## 2024-01-05 DIAGNOSIS — I10 Essential (primary) hypertension: Secondary | ICD-10-CM | POA: Diagnosis not present

## 2024-01-05 DIAGNOSIS — C779 Secondary and unspecified malignant neoplasm of lymph node, unspecified: Secondary | ICD-10-CM | POA: Diagnosis not present

## 2024-01-05 DIAGNOSIS — Z794 Long term (current) use of insulin: Secondary | ICD-10-CM | POA: Diagnosis not present

## 2024-01-05 DIAGNOSIS — Z510 Encounter for antineoplastic radiation therapy: Secondary | ICD-10-CM | POA: Diagnosis not present

## 2024-01-05 DIAGNOSIS — Z7984 Long term (current) use of oral hypoglycemic drugs: Secondary | ICD-10-CM | POA: Diagnosis not present

## 2024-01-05 DIAGNOSIS — Z79899 Other long term (current) drug therapy: Secondary | ICD-10-CM | POA: Diagnosis not present

## 2024-01-06 DIAGNOSIS — Z79899 Other long term (current) drug therapy: Secondary | ICD-10-CM | POA: Diagnosis not present

## 2024-01-06 DIAGNOSIS — Z7982 Long term (current) use of aspirin: Secondary | ICD-10-CM | POA: Diagnosis not present

## 2024-01-06 DIAGNOSIS — Z5112 Encounter for antineoplastic immunotherapy: Secondary | ICD-10-CM | POA: Diagnosis not present

## 2024-01-06 DIAGNOSIS — E119 Type 2 diabetes mellitus without complications: Secondary | ICD-10-CM | POA: Diagnosis not present

## 2024-01-06 DIAGNOSIS — Z510 Encounter for antineoplastic radiation therapy: Secondary | ICD-10-CM | POA: Diagnosis not present

## 2024-01-06 DIAGNOSIS — K5900 Constipation, unspecified: Secondary | ICD-10-CM | POA: Diagnosis not present

## 2024-01-06 DIAGNOSIS — Z7984 Long term (current) use of oral hypoglycemic drugs: Secondary | ICD-10-CM | POA: Diagnosis not present

## 2024-01-06 DIAGNOSIS — I10 Essential (primary) hypertension: Secondary | ICD-10-CM | POA: Diagnosis not present

## 2024-01-06 DIAGNOSIS — C779 Secondary and unspecified malignant neoplasm of lymph node, unspecified: Secondary | ICD-10-CM | POA: Diagnosis not present

## 2024-01-06 DIAGNOSIS — Z7985 Long-term (current) use of injectable non-insulin antidiabetic drugs: Secondary | ICD-10-CM | POA: Diagnosis not present

## 2024-01-06 DIAGNOSIS — A000 Cholera due to Vibrio cholerae 01, biovar cholerae: Secondary | ICD-10-CM | POA: Diagnosis not present

## 2024-01-06 DIAGNOSIS — J449 Chronic obstructive pulmonary disease, unspecified: Secondary | ICD-10-CM | POA: Diagnosis not present

## 2024-01-06 DIAGNOSIS — Z794 Long term (current) use of insulin: Secondary | ICD-10-CM | POA: Diagnosis not present

## 2024-01-06 DIAGNOSIS — C4359 Malignant melanoma of other part of trunk: Secondary | ICD-10-CM | POA: Diagnosis not present

## 2024-01-06 DIAGNOSIS — Z87891 Personal history of nicotine dependence: Secondary | ICD-10-CM | POA: Diagnosis not present

## 2024-01-07 DIAGNOSIS — Z87891 Personal history of nicotine dependence: Secondary | ICD-10-CM | POA: Diagnosis not present

## 2024-01-07 DIAGNOSIS — K5900 Constipation, unspecified: Secondary | ICD-10-CM | POA: Diagnosis not present

## 2024-01-07 DIAGNOSIS — Z5112 Encounter for antineoplastic immunotherapy: Secondary | ICD-10-CM | POA: Diagnosis not present

## 2024-01-07 DIAGNOSIS — I10 Essential (primary) hypertension: Secondary | ICD-10-CM | POA: Diagnosis not present

## 2024-01-07 DIAGNOSIS — J449 Chronic obstructive pulmonary disease, unspecified: Secondary | ICD-10-CM | POA: Diagnosis not present

## 2024-01-07 DIAGNOSIS — E119 Type 2 diabetes mellitus without complications: Secondary | ICD-10-CM | POA: Diagnosis not present

## 2024-01-07 DIAGNOSIS — Z7984 Long term (current) use of oral hypoglycemic drugs: Secondary | ICD-10-CM | POA: Diagnosis not present

## 2024-01-07 DIAGNOSIS — C779 Secondary and unspecified malignant neoplasm of lymph node, unspecified: Secondary | ICD-10-CM | POA: Diagnosis not present

## 2024-01-07 DIAGNOSIS — Z7982 Long term (current) use of aspirin: Secondary | ICD-10-CM | POA: Diagnosis not present

## 2024-01-07 DIAGNOSIS — Z510 Encounter for antineoplastic radiation therapy: Secondary | ICD-10-CM | POA: Diagnosis not present

## 2024-01-07 DIAGNOSIS — Z794 Long term (current) use of insulin: Secondary | ICD-10-CM | POA: Diagnosis not present

## 2024-01-07 DIAGNOSIS — Z79899 Other long term (current) drug therapy: Secondary | ICD-10-CM | POA: Diagnosis not present

## 2024-01-07 DIAGNOSIS — Z7985 Long-term (current) use of injectable non-insulin antidiabetic drugs: Secondary | ICD-10-CM | POA: Diagnosis not present

## 2024-01-07 DIAGNOSIS — C4359 Malignant melanoma of other part of trunk: Secondary | ICD-10-CM | POA: Diagnosis not present

## 2024-01-08 DIAGNOSIS — E119 Type 2 diabetes mellitus without complications: Secondary | ICD-10-CM | POA: Diagnosis not present

## 2024-01-08 DIAGNOSIS — I10 Essential (primary) hypertension: Secondary | ICD-10-CM | POA: Diagnosis not present

## 2024-01-08 DIAGNOSIS — Z794 Long term (current) use of insulin: Secondary | ICD-10-CM | POA: Diagnosis not present

## 2024-01-08 DIAGNOSIS — Z7984 Long term (current) use of oral hypoglycemic drugs: Secondary | ICD-10-CM | POA: Diagnosis not present

## 2024-01-08 DIAGNOSIS — C4359 Malignant melanoma of other part of trunk: Secondary | ICD-10-CM | POA: Diagnosis not present

## 2024-01-08 DIAGNOSIS — Z79899 Other long term (current) drug therapy: Secondary | ICD-10-CM | POA: Diagnosis not present

## 2024-01-08 DIAGNOSIS — Z510 Encounter for antineoplastic radiation therapy: Secondary | ICD-10-CM | POA: Diagnosis not present

## 2024-01-08 DIAGNOSIS — J449 Chronic obstructive pulmonary disease, unspecified: Secondary | ICD-10-CM | POA: Diagnosis not present

## 2024-01-08 DIAGNOSIS — K5900 Constipation, unspecified: Secondary | ICD-10-CM | POA: Diagnosis not present

## 2024-01-08 DIAGNOSIS — Z87891 Personal history of nicotine dependence: Secondary | ICD-10-CM | POA: Diagnosis not present

## 2024-01-08 DIAGNOSIS — Z5112 Encounter for antineoplastic immunotherapy: Secondary | ICD-10-CM | POA: Diagnosis not present

## 2024-01-08 DIAGNOSIS — C779 Secondary and unspecified malignant neoplasm of lymph node, unspecified: Secondary | ICD-10-CM | POA: Diagnosis not present

## 2024-01-08 DIAGNOSIS — Z7982 Long term (current) use of aspirin: Secondary | ICD-10-CM | POA: Diagnosis not present

## 2024-01-08 DIAGNOSIS — Z7985 Long-term (current) use of injectable non-insulin antidiabetic drugs: Secondary | ICD-10-CM | POA: Diagnosis not present

## 2024-01-11 DIAGNOSIS — Z510 Encounter for antineoplastic radiation therapy: Secondary | ICD-10-CM | POA: Diagnosis not present

## 2024-01-11 DIAGNOSIS — C779 Secondary and unspecified malignant neoplasm of lymph node, unspecified: Secondary | ICD-10-CM | POA: Diagnosis not present

## 2024-01-11 DIAGNOSIS — Z5112 Encounter for antineoplastic immunotherapy: Secondary | ICD-10-CM | POA: Diagnosis not present

## 2024-01-11 DIAGNOSIS — E119 Type 2 diabetes mellitus without complications: Secondary | ICD-10-CM | POA: Diagnosis not present

## 2024-01-11 DIAGNOSIS — Z87891 Personal history of nicotine dependence: Secondary | ICD-10-CM | POA: Diagnosis not present

## 2024-01-11 DIAGNOSIS — Z7985 Long-term (current) use of injectable non-insulin antidiabetic drugs: Secondary | ICD-10-CM | POA: Diagnosis not present

## 2024-01-11 DIAGNOSIS — K5900 Constipation, unspecified: Secondary | ICD-10-CM | POA: Diagnosis not present

## 2024-01-11 DIAGNOSIS — J449 Chronic obstructive pulmonary disease, unspecified: Secondary | ICD-10-CM | POA: Diagnosis not present

## 2024-01-11 DIAGNOSIS — Z7984 Long term (current) use of oral hypoglycemic drugs: Secondary | ICD-10-CM | POA: Diagnosis not present

## 2024-01-11 DIAGNOSIS — Z794 Long term (current) use of insulin: Secondary | ICD-10-CM | POA: Diagnosis not present

## 2024-01-11 DIAGNOSIS — I10 Essential (primary) hypertension: Secondary | ICD-10-CM | POA: Diagnosis not present

## 2024-01-11 DIAGNOSIS — Z79899 Other long term (current) drug therapy: Secondary | ICD-10-CM | POA: Diagnosis not present

## 2024-01-11 DIAGNOSIS — Z7982 Long term (current) use of aspirin: Secondary | ICD-10-CM | POA: Diagnosis not present

## 2024-01-11 DIAGNOSIS — C4359 Malignant melanoma of other part of trunk: Secondary | ICD-10-CM | POA: Diagnosis not present

## 2024-01-12 DIAGNOSIS — Z510 Encounter for antineoplastic radiation therapy: Secondary | ICD-10-CM | POA: Diagnosis not present

## 2024-01-12 DIAGNOSIS — E1165 Type 2 diabetes mellitus with hyperglycemia: Secondary | ICD-10-CM | POA: Diagnosis not present

## 2024-01-12 DIAGNOSIS — Z5112 Encounter for antineoplastic immunotherapy: Secondary | ICD-10-CM | POA: Diagnosis not present

## 2024-01-12 DIAGNOSIS — Z79899 Other long term (current) drug therapy: Secondary | ICD-10-CM | POA: Diagnosis not present

## 2024-01-12 DIAGNOSIS — E119 Type 2 diabetes mellitus without complications: Secondary | ICD-10-CM | POA: Diagnosis not present

## 2024-01-12 DIAGNOSIS — K5900 Constipation, unspecified: Secondary | ICD-10-CM | POA: Diagnosis not present

## 2024-01-12 DIAGNOSIS — Z87891 Personal history of nicotine dependence: Secondary | ICD-10-CM | POA: Diagnosis not present

## 2024-01-12 DIAGNOSIS — Z7985 Long-term (current) use of injectable non-insulin antidiabetic drugs: Secondary | ICD-10-CM | POA: Diagnosis not present

## 2024-01-12 DIAGNOSIS — C779 Secondary and unspecified malignant neoplasm of lymph node, unspecified: Secondary | ICD-10-CM | POA: Diagnosis not present

## 2024-01-12 DIAGNOSIS — J449 Chronic obstructive pulmonary disease, unspecified: Secondary | ICD-10-CM | POA: Diagnosis not present

## 2024-01-12 DIAGNOSIS — Z7984 Long term (current) use of oral hypoglycemic drugs: Secondary | ICD-10-CM | POA: Diagnosis not present

## 2024-01-12 DIAGNOSIS — C4359 Malignant melanoma of other part of trunk: Secondary | ICD-10-CM | POA: Diagnosis not present

## 2024-01-12 DIAGNOSIS — Z794 Long term (current) use of insulin: Secondary | ICD-10-CM | POA: Diagnosis not present

## 2024-01-12 DIAGNOSIS — Z7982 Long term (current) use of aspirin: Secondary | ICD-10-CM | POA: Diagnosis not present

## 2024-01-12 DIAGNOSIS — I10 Essential (primary) hypertension: Secondary | ICD-10-CM | POA: Diagnosis not present

## 2024-01-18 DIAGNOSIS — Z79899 Other long term (current) drug therapy: Secondary | ICD-10-CM | POA: Diagnosis not present

## 2024-01-18 DIAGNOSIS — Z923 Personal history of irradiation: Secondary | ICD-10-CM | POA: Diagnosis not present

## 2024-01-18 DIAGNOSIS — C7802 Secondary malignant neoplasm of left lung: Secondary | ICD-10-CM | POA: Diagnosis not present

## 2024-01-18 DIAGNOSIS — C439 Malignant melanoma of skin, unspecified: Secondary | ICD-10-CM | POA: Diagnosis not present

## 2024-01-18 DIAGNOSIS — R918 Other nonspecific abnormal finding of lung field: Secondary | ICD-10-CM | POA: Diagnosis not present

## 2024-01-18 DIAGNOSIS — C7800 Secondary malignant neoplasm of unspecified lung: Secondary | ICD-10-CM | POA: Diagnosis not present

## 2024-01-18 DIAGNOSIS — C7801 Secondary malignant neoplasm of right lung: Secondary | ICD-10-CM | POA: Diagnosis not present

## 2024-01-18 DIAGNOSIS — C4359 Malignant melanoma of other part of trunk: Secondary | ICD-10-CM | POA: Diagnosis not present

## 2024-01-18 DIAGNOSIS — I10 Essential (primary) hypertension: Secondary | ICD-10-CM | POA: Diagnosis not present

## 2024-01-24 DIAGNOSIS — G839 Paralytic syndrome, unspecified: Secondary | ICD-10-CM | POA: Diagnosis not present

## 2024-01-31 ENCOUNTER — Other Ambulatory Visit: Payer: Self-pay | Admitting: Internal Medicine

## 2024-01-31 DIAGNOSIS — E118 Type 2 diabetes mellitus with unspecified complications: Secondary | ICD-10-CM

## 2024-01-31 DIAGNOSIS — J9611 Chronic respiratory failure with hypoxia: Secondary | ICD-10-CM

## 2024-01-31 DIAGNOSIS — J432 Centrilobular emphysema: Secondary | ICD-10-CM

## 2024-02-04 DIAGNOSIS — C439 Malignant melanoma of skin, unspecified: Secondary | ICD-10-CM | POA: Diagnosis not present

## 2024-02-04 DIAGNOSIS — C438 Malignant melanoma of overlapping sites of skin: Secondary | ICD-10-CM | POA: Diagnosis not present

## 2024-02-04 DIAGNOSIS — C7800 Secondary malignant neoplasm of unspecified lung: Secondary | ICD-10-CM | POA: Diagnosis not present

## 2024-02-04 DIAGNOSIS — Z79899 Other long term (current) drug therapy: Secondary | ICD-10-CM | POA: Diagnosis not present

## 2024-02-05 DIAGNOSIS — A000 Cholera due to Vibrio cholerae 01, biovar cholerae: Secondary | ICD-10-CM | POA: Diagnosis not present

## 2024-02-23 DIAGNOSIS — C7800 Secondary malignant neoplasm of unspecified lung: Secondary | ICD-10-CM | POA: Diagnosis not present

## 2024-02-24 DIAGNOSIS — G839 Paralytic syndrome, unspecified: Secondary | ICD-10-CM | POA: Diagnosis not present

## 2024-02-26 ENCOUNTER — Other Ambulatory Visit: Payer: Self-pay | Admitting: Internal Medicine

## 2024-02-26 ENCOUNTER — Encounter: Payer: Self-pay | Admitting: Internal Medicine

## 2024-02-26 ENCOUNTER — Telehealth: Payer: Self-pay

## 2024-02-26 DIAGNOSIS — E876 Hypokalemia: Secondary | ICD-10-CM

## 2024-02-26 MED ORDER — POTASSIUM CHLORIDE CRYS ER 20 MEQ PO TBCR
20.0000 meq | EXTENDED_RELEASE_TABLET | Freq: Two times a day (BID) | ORAL | 0 refills | Status: DC
Start: 2024-02-26 — End: 2024-02-26

## 2024-02-26 MED ORDER — POTASSIUM CHLORIDE CRYS ER 20 MEQ PO TBCR: 20.0000 meq | EXTENDED_RELEASE_TABLET | Freq: Two times a day (BID) | ORAL | 0 refills | Status: AC

## 2024-02-26 NOTE — Telephone Encounter (Signed)
 First attempt, LVM to call back to [phone]   Copied from CRM [id number]. Topic: Clinical - Medication Question >> Feb 26, 2024  3:11 PM Chuck Crater wrote: Reason for CRM: Patient stated that her potassium was 3.1 yesterday from her infusion.  She states that it has been  low for about 6 months. She had to have pills and bag of potassium to get it back up.  Patient wants to know if she can get a prescription for something stronger. [facility] advised patient to call pcp to get a prescripton to get it back up.

## 2024-02-26 NOTE — Telephone Encounter (Signed)
 Left a detailed message letting pt know that the medication was sent in also sent a mychart message letting pt know.

## 2024-02-29 ENCOUNTER — Other Ambulatory Visit: Payer: Self-pay | Admitting: Internal Medicine

## 2024-03-07 DIAGNOSIS — A000 Cholera due to Vibrio cholerae 01, biovar cholerae: Secondary | ICD-10-CM | POA: Diagnosis not present

## 2024-03-08 ENCOUNTER — Telehealth: Payer: Self-pay

## 2024-03-08 NOTE — Telephone Encounter (Signed)
 Spoke with pt to let her know that we have received her patient assistance medication and it is ready for pick up.    Ozempic  1 mg: 4 boxes Pen Needles: 2 boxes

## 2024-03-16 ENCOUNTER — Ambulatory Visit: Admitting: Internal Medicine

## 2024-03-17 DIAGNOSIS — Z79899 Other long term (current) drug therapy: Secondary | ICD-10-CM | POA: Diagnosis not present

## 2024-03-17 DIAGNOSIS — E876 Hypokalemia: Secondary | ICD-10-CM | POA: Diagnosis not present

## 2024-03-17 DIAGNOSIS — C7800 Secondary malignant neoplasm of unspecified lung: Secondary | ICD-10-CM | POA: Diagnosis not present

## 2024-03-17 DIAGNOSIS — R935 Abnormal findings on diagnostic imaging of other abdominal regions, including retroperitoneum: Secondary | ICD-10-CM | POA: Diagnosis not present

## 2024-03-17 DIAGNOSIS — C439 Malignant melanoma of skin, unspecified: Secondary | ICD-10-CM | POA: Diagnosis not present

## 2024-03-17 DIAGNOSIS — C438 Malignant melanoma of overlapping sites of skin: Secondary | ICD-10-CM | POA: Diagnosis not present

## 2024-03-17 DIAGNOSIS — R918 Other nonspecific abnormal finding of lung field: Secondary | ICD-10-CM | POA: Diagnosis not present

## 2024-03-18 ENCOUNTER — Telehealth: Payer: Self-pay

## 2024-03-18 NOTE — Telephone Encounter (Signed)
 Called and let pt know that her medication are ready for pick up     Medication: Tresiba  200 U/ML Sig: Inject 34 Units into the skin daily.    QTY: 3 boxes

## 2024-03-25 DIAGNOSIS — G839 Paralytic syndrome, unspecified: Secondary | ICD-10-CM | POA: Diagnosis not present

## 2024-03-30 ENCOUNTER — Other Ambulatory Visit: Payer: Self-pay | Admitting: Internal Medicine

## 2024-03-31 ENCOUNTER — Ambulatory Visit: Payer: Self-pay

## 2024-03-31 NOTE — Telephone Encounter (Signed)
 Spoke with pt to see how she was felling after the fall this morning. Pt stated that she feel at 2 am this morning on her buttocks. When she fell she hit her hand on something that caused a skin tear. Pt stated that it has quit bleeding and there is no pain. Pt is scheduled for an appt tomorrow at 4:30 and I advised pt that we would take a look at the tear then. Pt stated that she wanted to cancel the appt because she doesn't feel like going any where, so I offered to change the appt to virtual and pt stated that was fine.

## 2024-03-31 NOTE — Telephone Encounter (Signed)
 FYI Only or Action Required?: FYI only for provider.  Patient was last seen in primary care on 12/08/2023 by Marylynn Verneita CROME, MD.  Called Nurse Triage reporting No chief complaint on file..  Symptoms began today.  Interventions attempted: Nothing.  Symptoms are: unchanged.  Triage Disposition: See PCP When Office is Open (Within 3 Days)  Patient/caregiver understands and will follow disposition?: Yes     Copied from CRM #[id number]. Topic: Clinical - Red Word Triage >> Mar 31, 2024  8:03 AM Deaijah H wrote: Red Word that prompted transfer to Nurse Triage: [place]. Tear in finger. Reason for Disposition  MILD weakness (e.g., does not interfere with ability to work, go to school, normal activities)  (Exception: Mild weakness is a chronic symptom.)  Answer Assessment - Initial Assessment Questions 1. MECHANISM: How did the fall happen?     Mishandling of walker caused a fall  2. DOMESTIC VIOLENCE AND ELDER ABUSE SCREENING: Did you fall because someone pushed you or tried to hurt you? If Yes, ask: Are you safe now?     No  3. ONSET: When did the fall happen? (e.g., minutes, hours, or days ago)      This morning  4. LOCATION: What part of the body hit the ground? (e.g., back, buttocks, head, hips, knees, hands, head, stomach)     Buttocks  5. INJURY: Did you hurt (injure) yourself when you fell? If Yes, ask: What did you injure? Tell me more about this? (e.g., body area; type of injury; pain severity)     Skin Tear on Right Hand  6. PAIN: Is there any pain? If Yes, ask: How bad is the pain? (e.g., Scale 0-10; or none, mild,      Sore  7. SIZE: For cuts, bruises, or swelling, ask: How large is it? (e.g., inches or centimeters)      Bruising indicative  8. PREGNANCY: Is there any chance you are pregnant? When was your last menstrual period?     NO and No  9. OTHER SYMPTOMS: Do you have any other symptoms? (e.g., dizziness, fever, weakness; new-onset or  worsening).      Denies  10. CAUSE: What do you think caused the fall (or falling)? (e.g., dizzy spell, tripped)       Tripped with Walker  Protocols used: Falls and [facility]

## 2024-04-01 ENCOUNTER — Telehealth (INDEPENDENT_AMBULATORY_CARE_PROVIDER_SITE_OTHER): Admitting: Internal Medicine

## 2024-04-01 ENCOUNTER — Encounter: Payer: Self-pay | Admitting: Internal Medicine

## 2024-04-01 VITALS — BP 140/75 | HR 84 | Temp 97.5°F | Ht 63.0 in | Wt 236.0 lb

## 2024-04-01 DIAGNOSIS — C4359 Malignant melanoma of other part of trunk: Secondary | ICD-10-CM | POA: Diagnosis not present

## 2024-04-01 DIAGNOSIS — I10 Essential (primary) hypertension: Secondary | ICD-10-CM

## 2024-04-01 DIAGNOSIS — R531 Weakness: Secondary | ICD-10-CM | POA: Diagnosis not present

## 2024-04-01 MED ORDER — PAROXETINE HCL 20 MG PO TABS: ORAL_TABLET | ORAL | 1 refills | Status: AC

## 2024-04-01 MED ORDER — AMLODIPINE BESYLATE 2.5 MG PO TABS: 2.5000 mg | ORAL_TABLET | Freq: Every day | ORAL | 1 refills | Status: DC

## 2024-04-01 NOTE — Progress Notes (Signed)
 Virtual Visit via Caregility   Note   This format is felt to be most appropriate for this patient at this time.  All issues noted in this document were discussed and addressed.  No physical exam was performed (except for noted visual exam findings with Video Visits).   I connected with Debra on 04/01/24 at  4:30 PM EDT by a video enabled telemedicine application  and verified that I am speaking with the correct person using two identifiers. Location patient: home Location provider: work or home office Persons participating in the virtual visit: patient, provider  I discussed the limitations, risks, security and privacy concerns of performing an evaluation and management service by telephone and the availability of in person appointments. I also discussed with the patient that there may be a patient responsible charge related to this service. The patient expressed understanding and agreed to proceed.  Reason for visit: diabetes follow up   HPI:   T2 DM:  I have downloaded and reviewed the data from patient's continuous blood glucose monitor  for  the period  of     July 12 to July 25   Patient's  sugars have been  IN RANGE  61   % OF THE TIME,   ABOVE RANGE 39   % of the time.  Her average glucose is 171.  Medications currently used for glycemic control are Tresiba  36 units daily  and Novolog  taken at mealtime 15 units at breakfast, 25 units at lunch and 25 units at dinner.  She also takes metformin  1000 mg daily and Ozempic  1 mg weekly  Patient reports compliance with medication approximately  75% of the time because she often skips breakfast ut has had a change in appetite since she began treatment for metastatic melanoma.   Her most frequent elevations  occur between the hours of 4 pm and midnight.  She has a milkshake most days around 4 pm, when her husband comes home from work.  She lies awake from midnight to 2 am watching TV, and sleeps most of the day until late afternoon    Falls: she has  had 8 falls in the last 7 days, denies dizziness,  but having leg weakness. . No headaches until today.  BP was measured recently at 143/89 during headache   Melanoma :   HTN:  has been checking bp at home 124/75   ROS: See pertinent positives and negatives per HPI.  Past Medical History:  Diagnosis Date   Anxiety    Asthma    COPD (chronic obstructive pulmonary disease) (HCC)    COPD exacerbation (HCC) 08/10/2011   Exacerbation 12/2011 June 2013 mMRC 2 July PFT Ratio 50%, FEV1 0.91 L, 41% pred, DLCO 51% pred, TLC 3.39 L 72% pred Exacerbation 04/2012, admitted [facility]   Depression    Diabetes mellitus    Gastritis and duodenitis June 2011   EGD   GERD (gastroesophageal reflux disease)    Hyperglycemia    Hyperlipidemia    Hypertension    Leukocytosis    Obesity (BMI 30-39.9)    Renal insufficiency 09/06/2023   S/P cardiac catheterization March 2012   normal coronaries,  due to chest pain [place])   Screening for breast cancer Jul 28 2011   normal   Screening for colon cancer June 2011   polyps found, next one due 2014 ([facility])   Tobacco abuse, in remission    quit oct during hospitalization     Past Surgical History:  Procedure Laterality  Date   ABDOMINAL HYSTERECTOMY     ABDOMINAL HYSTERECTOMY     BILATERAL OOPHORECTOMY  1995   and TAH.  noncancerous reasons   biospy under arm Right 10/2023   CARDIAC CATHETERIZATION  11/22/2010   no significant disease   lesion removed from back  09/30/2023    Family History  Problem Relation Age of Onset   Coronary artery disease Mother 78   Heart disease Mother        CABG x5   COPD Mother 61   Breast cancer Mother    Kidney disease Mother    Heart failure Father    Angina Father    Diabetes Father    Depression Father    Stroke Father    Dementia Father    Cancer Sister        breast, lung, brain   Asthma Brother    Asthma Brother    Diabetes Brother    Hypertension Brother     SOCIAL HX:  reports that she quit  smoking about 12 years ago. Her smoking use included cigarettes. She started smoking about 52 years ago. She has a 40 pack-year smoking history. She has never used smokeless tobacco. She reports that she does not drink alcohol and does not use drugs.    Current Outpatient Medications:    albuterol  (VENTOLIN  HFA) 108 (90 Base) MCG/ACT inhaler, INHALE 1-2 PUFFS BY MOUTH EVERY 6 HOURS AS NEEDED FOR WHEEZE OR SHORTNESS OF BREATH, Disp: 18 each, Rfl: 3   ALPRAZolam  (XANAX ) 0.25 MG tablet, Take 1 tablet (0.25 mg total) by mouth 2 (two) times daily as needed for anxiety or sleep., Disp: 45 tablet, Rfl: 5   aspirin  81 MG EC tablet, Take 81 mg by mouth daily., Disp: , Rfl:    Budeson-Glycopyrrol-Formoterol  (BREZTRI  AEROSPHERE) 160-9-4.8 MCG/ACT AERO, Inhale 2 puffs into the lungs 2 (two) times daily., Disp: 32.1 g, Rfl: 3   Cholecalciferol  (VITAMIN D3) 125 MCG (5000 UT) CAPS, Take 1,000 Units by mouth daily. , Disp: , Rfl:    Continuous Glucose Sensor (FREESTYLE LIBRE 3 SENSOR) MISC, Place 1 sensor on the skin every 14 days. Use to check glucose continuously, Disp: , Rfl:    cyclobenzaprine  (FLEXERIL ) 5 MG tablet, TAKE 1 TABLET BY MOUTH THREE TIMES A DAY AS NEEDED FOR MUSCLE SPASM, Disp: 90 tablet, Rfl: 1   insulin  aspart (NOVOLOG  FLEXPEN) 100 UNIT/ML FlexPen, Inject 20 Units into the skin 3 (three) times daily with meals. (Patient taking differently: Inject 20 Units into the skin 3 (three) times daily with meals. 15 units in the morning, 25 units in the afternoon, 25 units in the evening), Disp: , Rfl:    insulin  degludec (TRESIBA  FLEXTOUCH) 200 UNIT/ML FlexTouch Pen, Inject 34 Units into the skin daily. (Patient taking differently: Inject 36 Units into the skin daily.), Disp: , Rfl:    ipratropium-albuterol  (DUONEB) 0.5-2.5 (3) MG/3ML SOLN, INHALE 3 ML BY NEBULIZER EVERY 6 HOURS AS NEEDED, Disp: 360 mL, Rfl: 3   lactulose  (CHRONULAC ) 10 GM/15ML solution, TAKE 30 ML EVERY 4 HOURS UNTIL CONSTIPATION IS  RELIEVED, Disp: 473 mL, Rfl: 1   losartan  (COZAAR ) 100 MG tablet, TAKE 1 TABLET BY MOUTH EVERY DAY, Disp: 90 tablet, Rfl: 1   Magnesium  Citrate (MAGNESIUM  GUMMIES PO), Take by mouth. Chews two a day, Disp: , Rfl:    Melatonin 5 MG TABS, Take 5 mg by mouth at bedtime., Disp: , Rfl:    metFORMIN  (GLUCOPHAGE ) 1000 MG tablet, TAKE 1 TABLET (1,000  MG TOTAL) BY MOUTH TWICE A DAY WITH FOOD, Disp: 180 tablet, Rfl: 1   metoprolol  succinate (TOPROL -XL) 25 MG 24 hr tablet, TAKE 1 TABLET BY MOUTH EVERY DAY, Disp: 90 tablet, Rfl: 3   polyethylene glycol powder (GLYCOLAX /MIRALAX ) 17 GM/SCOOP powder, Take 1 Container by mouth 2 (two) times daily., Disp: , Rfl:    Potassium 99 MG TABS, Take 99 mg by mouth daily. Reported on 09/20/2015, Disp: , Rfl:    potassium chloride  SA (KLOR-CON  M) 20 MEQ tablet, Take 1 tablet (20 mEq total) by mouth 2 (two) times daily., Disp: 10 tablet, Rfl: 0   Probiotic Product (PROBIOTIC DAILY PO), Take by mouth daily., Disp: , Rfl:    promethazine  (PHENERGAN ) 12.5 MG tablet, TAKE 1 TABLET (12.5 MG TOTAL) BY MOUTH AT BEDTIME AS NEEDED FOR NAUSEA OR VOMITING., Disp: 30 tablet, Rfl: 0   rosuvastatin  (CRESTOR ) 40 MG tablet, TAKE 1 TABLET BY MOUTH EVERY DAY, Disp: 90 tablet, Rfl: 3   Semaglutide , 1 MG/DOSE, 4 MG/3ML SOPN, Inject 1 mg as directed once a week., Disp: , Rfl:    amLODipine  (NORVASC ) 2.5 MG tablet, Take 1 tablet (2.5 mg total) by mouth daily., Disp: 90 tablet, Rfl: 1   PARoxetine  (PAXIL ) 20 MG tablet, TAKE 1 TABLET BY MOUTH EVERY DAY IN THE MORNING, Disp: 90 tablet, Rfl: 1  EXAM:  VITALS per patient if applicable:  GENERAL: alert, oriented, appears well and in no acute distress  HEENT: atraumatic, conjunttiva clear, no obvious abnormalities on inspection of external nose and ears  NECK: normal movements of the head and neck  LUNGS: on inspection no signs of respiratory distress, breathing rate appears normal, no obvious gross SOB, gasping or wheezing  CV: no obvious  cyanosis  MS: moves all visible extremities without noticeable abnormality  PSYCH/NEURO: pleasant and cooperative, no obvious depression or anxiety, speech and thought processing grossly intact  ASSESSMENT AND PLAN: Malignant melanoma of back [facility] Assessment & Plan: Diagnosed in Feb 2025 now noted to have 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  Treatment bu [facility] oncology has been underway but she has developed bilateral leg weakness resulting in recurrent falls with resultant change in therapy ( (lemvima discontinued    Primary hypertension Assessment & Plan: Home readings reviwed ,  continue amlodipine  2.5 mg . losartan  100 mg and toprol  xl 25 mg   Lab Results  Component Value Date   MICROALBUR 24.0 (H) 12/08/2023   MICROALBUR <0.2 08/08/2016        Weakness Assessment & Plan: Complicated by chemotherapy and COPD.  Using  walker . Needs a chair that stradles the tub   Orders: -     For home use only DME Other see comment  Other orders -     amLODIPine  Besylate; Take 1 tablet (2.5 mg total) by mouth daily.  Dispense: 90 tablet; Refill: 1 -     PARoxetine  HCl; TAKE 1 TABLET BY MOUTH EVERY DAY IN THE MORNING  Dispense: 90 tablet; Refill: 1      I discussed the assessment and treatment plan with the patient. The patient was provided an opportunity to ask questions and all were answered. The patient agreed with the plan and demonstrated an understanding of the instructions.   The patient was advised to call back or seek an in-person evaluation if the symptoms worsen or if the condition fails to improve as anticipated.   I spent 30 minutes dedicated to the care of this patient on the date of this encounter to include pre-visit  review of patient's medical history,  including recent oncology  visit, imaging studies and labs, face-to-face time with the patient , and post visit ordering of testing and therapeutics.    Verneita LITTIE Kettering, MD

## 2024-04-01 NOTE — Assessment & Plan Note (Signed)
 Advised to take f 5 units of novolog  for  a late afternoon milk shake.  Advised to use the following  sliding scale for your novolog  dose at dinnertime:    If your pre dinner blood  sugar is 150 and you are having meat and salad,  take 10 units If                                150 and you are having pasta,  chinese food,  or pizza, take 15 units     For pre dinner sugar between 150 and 200,    use 15 units for meat and salad,  20 units for the high carb meals.    For pre dinner sugar between 200 and  250,    take 20 units for meat and salad,  and 25 units for high carb meals

## 2024-04-01 NOTE — Patient Instructions (Addendum)
 Your blood sugars are elevated from 4 pm to midnight  Give yourself 5 units of novolog  if you having a late afternoon milk shake   Then at dinnertime,  use this sliding scale for your novolog  dose   If your pre dinner blood  sugar is 150 and you are having meat and salad,  take 10 units If                                150 and you are having pasta,  chinese food,  or pizza, take 15 units     For pre dinner sugar between 150 and 200,    use 15 units for meat and salad,  20 units for the high carb meals.    For pre dinner sugar between 200 and  250,    take 20 units for meat and salad,  and 25 units for high carb meals

## 2024-04-03 NOTE — Assessment & Plan Note (Addendum)
 Home readings reviwed ,  continue amlodipine  2.5 mg . losartan  100 mg and toprol  xl 25 mg   Lab Results  Component Value Date   MICROALBUR 24.0 (H) 12/08/2023   MICROALBUR <0.2 08/08/2016

## 2024-04-03 NOTE — Assessment & Plan Note (Addendum)
 Complicated by chemotherapy and COPD.  Using  walker . Needs a chair that stradles the tub

## 2024-04-03 NOTE — Assessment & Plan Note (Signed)
 Diagnosed in Feb 2025 now noted to have 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  Treatment bu [facility] oncology has been underway but she has developed bilateral leg weakness resulting in recurrent falls with resultant change in therapy ( (lemvima discontinued

## 2024-04-05 ENCOUNTER — Other Ambulatory Visit: Payer: Self-pay | Admitting: Internal Medicine

## 2024-04-06 ENCOUNTER — Encounter: Payer: Self-pay | Admitting: Internal Medicine

## 2024-04-06 DIAGNOSIS — A000 Cholera due to Vibrio cholerae 01, biovar cholerae: Secondary | ICD-10-CM | POA: Diagnosis not present

## 2024-04-07 DIAGNOSIS — C439 Malignant melanoma of skin, unspecified: Secondary | ICD-10-CM | POA: Diagnosis not present

## 2024-04-07 DIAGNOSIS — Z79899 Other long term (current) drug therapy: Secondary | ICD-10-CM | POA: Diagnosis not present

## 2024-04-07 DIAGNOSIS — Z5112 Encounter for antineoplastic immunotherapy: Secondary | ICD-10-CM | POA: Diagnosis not present

## 2024-04-07 DIAGNOSIS — C7800 Secondary malignant neoplasm of unspecified lung: Secondary | ICD-10-CM | POA: Diagnosis not present

## 2024-04-07 DIAGNOSIS — E876 Hypokalemia: Secondary | ICD-10-CM | POA: Diagnosis not present

## 2024-04-12 ENCOUNTER — Telehealth: Payer: Self-pay | Admitting: Internal Medicine

## 2024-04-12 NOTE — Telephone Encounter (Signed)
 Placed in yellow results folder for review.

## 2024-04-12 NOTE — Telephone Encounter (Signed)
 Pt's spouse dropped off a list of blood sugar readings for Dr Marylynn. The envelope is in her color folder up front -[facility]

## 2024-04-13 ENCOUNTER — Encounter: Payer: Self-pay | Admitting: Internal Medicine

## 2024-04-13 DIAGNOSIS — E1165 Type 2 diabetes mellitus with hyperglycemia: Secondary | ICD-10-CM | POA: Diagnosis not present

## 2024-04-13 NOTE — Telephone Encounter (Signed)
 Printed and given back to Dr. [name]

## 2024-04-19 ENCOUNTER — Other Ambulatory Visit: Payer: Self-pay

## 2024-04-19 DIAGNOSIS — J9611 Chronic respiratory failure with hypoxia: Secondary | ICD-10-CM

## 2024-04-19 MED ORDER — IPRATROPIUM-ALBUTEROL 0.5-2.5 (3) MG/3ML IN SOLN: RESPIRATORY_TRACT | 3 refills | Status: DC

## 2024-04-22 ENCOUNTER — Telehealth: Payer: Self-pay

## 2024-04-22 NOTE — Telephone Encounter (Signed)
 Have signed order for bathtub straddle chair for patient.  Called pt to see where the order needed to be sent or if she was going to pick up.  No answer, left a voicemail to callback.

## 2024-04-25 DIAGNOSIS — G839 Paralytic syndrome, unspecified: Secondary | ICD-10-CM | POA: Diagnosis not present

## 2024-04-26 NOTE — Telephone Encounter (Signed)
 Spoke with pt and let her know that we have submitted the order for the bathtub chair and we will contact her with an update as soon as we get one. Pt stated that stated that she would like to have an order for a motorized wheelchair. If this is okay I will submit the referral to [facility].

## 2024-04-28 DIAGNOSIS — C439 Malignant melanoma of skin, unspecified: Secondary | ICD-10-CM | POA: Diagnosis not present

## 2024-04-28 DIAGNOSIS — Z5112 Encounter for antineoplastic immunotherapy: Secondary | ICD-10-CM | POA: Diagnosis not present

## 2024-04-28 DIAGNOSIS — C7800 Secondary malignant neoplasm of unspecified lung: Secondary | ICD-10-CM | POA: Diagnosis not present

## 2024-04-28 DIAGNOSIS — Z79899 Other long term (current) drug therapy: Secondary | ICD-10-CM | POA: Diagnosis not present

## 2024-04-30 ENCOUNTER — Other Ambulatory Visit: Payer: Self-pay | Admitting: Internal Medicine

## 2024-05-07 DIAGNOSIS — A000 Cholera due to Vibrio cholerae 01, biovar cholerae: Secondary | ICD-10-CM | POA: Diagnosis not present

## 2024-05-19 DIAGNOSIS — C7800 Secondary malignant neoplasm of unspecified lung: Secondary | ICD-10-CM | POA: Diagnosis not present

## 2024-05-19 DIAGNOSIS — R2689 Other abnormalities of gait and mobility: Secondary | ICD-10-CM | POA: Diagnosis not present

## 2024-05-19 DIAGNOSIS — Z79899 Other long term (current) drug therapy: Secondary | ICD-10-CM | POA: Diagnosis not present

## 2024-05-19 DIAGNOSIS — C439 Malignant melanoma of skin, unspecified: Secondary | ICD-10-CM | POA: Diagnosis not present

## 2024-05-24 ENCOUNTER — Telehealth: Payer: Self-pay

## 2024-05-24 NOTE — Telephone Encounter (Signed)
 Completed refill/ order form for Ozempic  (Novo Nordisk) and faxed to provider's office for review and signature.

## 2024-05-26 DIAGNOSIS — G839 Paralytic syndrome, unspecified: Secondary | ICD-10-CM | POA: Diagnosis not present

## 2024-06-01 ENCOUNTER — Other Ambulatory Visit: Payer: Self-pay | Admitting: Internal Medicine

## 2024-06-07 DIAGNOSIS — A000 Cholera due to Vibrio cholerae 01, biovar cholerae: Secondary | ICD-10-CM | POA: Diagnosis not present

## 2024-06-09 DIAGNOSIS — C439 Malignant melanoma of skin, unspecified: Secondary | ICD-10-CM | POA: Diagnosis not present

## 2024-06-09 DIAGNOSIS — C7800 Secondary malignant neoplasm of unspecified lung: Secondary | ICD-10-CM | POA: Diagnosis not present

## 2024-06-09 DIAGNOSIS — Z79899 Other long term (current) drug therapy: Secondary | ICD-10-CM | POA: Diagnosis not present

## 2024-06-09 DIAGNOSIS — Z5112 Encounter for antineoplastic immunotherapy: Secondary | ICD-10-CM | POA: Diagnosis not present

## 2024-06-14 ENCOUNTER — Encounter: Payer: Self-pay | Admitting: Pharmacist

## 2024-06-16 NOTE — Telephone Encounter (Signed)
 LMTCB

## 2024-06-17 DIAGNOSIS — R911 Solitary pulmonary nodule: Secondary | ICD-10-CM | POA: Diagnosis not present

## 2024-06-17 DIAGNOSIS — C787 Secondary malignant neoplasm of liver and intrahepatic bile duct: Secondary | ICD-10-CM | POA: Diagnosis not present

## 2024-06-17 DIAGNOSIS — C438 Malignant melanoma of overlapping sites of skin: Secondary | ICD-10-CM | POA: Diagnosis not present

## 2024-06-17 DIAGNOSIS — C7800 Secondary malignant neoplasm of unspecified lung: Secondary | ICD-10-CM | POA: Diagnosis not present

## 2024-06-20 ENCOUNTER — Telehealth: Payer: Self-pay

## 2024-06-20 NOTE — Telephone Encounter (Signed)
 Pt notified of pt assistance meds ready for pick up.   Ozempic  4mg /29ml 4 boxes lot: [id number] Exp: 08/07/26

## 2024-06-21 DIAGNOSIS — Z79899 Other long term (current) drug therapy: Secondary | ICD-10-CM | POA: Diagnosis not present

## 2024-06-21 DIAGNOSIS — C439 Malignant melanoma of skin, unspecified: Secondary | ICD-10-CM | POA: Diagnosis not present

## 2024-06-21 DIAGNOSIS — C7800 Secondary malignant neoplasm of unspecified lung: Secondary | ICD-10-CM | POA: Diagnosis not present

## 2024-06-21 NOTE — Telephone Encounter (Signed)
Medication has been picked up.  

## 2024-06-22 ENCOUNTER — Telehealth: Payer: Self-pay

## 2024-06-22 ENCOUNTER — Telehealth: Payer: Self-pay | Admitting: *Deleted

## 2024-06-22 NOTE — Telephone Encounter (Signed)
 Pt came in and picked up pt assistance medication Tresiba  3 boxes on 06/22/24 @ 2pm

## 2024-06-22 NOTE — Telephone Encounter (Signed)
 Patient notified that Patient Assistance Medication are in the office & are ready for pick up.   Medication: Tresiba  U200  Quantity: 3 boxes  Lot# [id number]  Exp: 07/08/2026

## 2024-06-30 DIAGNOSIS — Z79899 Other long term (current) drug therapy: Secondary | ICD-10-CM | POA: Diagnosis not present

## 2024-06-30 DIAGNOSIS — Z7984 Long term (current) use of oral hypoglycemic drugs: Secondary | ICD-10-CM | POA: Diagnosis not present

## 2024-06-30 DIAGNOSIS — K769 Liver disease, unspecified: Secondary | ICD-10-CM | POA: Diagnosis not present

## 2024-06-30 DIAGNOSIS — F32A Depression, unspecified: Secondary | ICD-10-CM | POA: Diagnosis not present

## 2024-06-30 DIAGNOSIS — Z9981 Dependence on supplemental oxygen: Secondary | ICD-10-CM | POA: Diagnosis not present

## 2024-06-30 DIAGNOSIS — Z5112 Encounter for antineoplastic immunotherapy: Secondary | ICD-10-CM | POA: Diagnosis not present

## 2024-06-30 DIAGNOSIS — T451X5A Adverse effect of antineoplastic and immunosuppressive drugs, initial encounter: Secondary | ICD-10-CM | POA: Diagnosis not present

## 2024-06-30 DIAGNOSIS — I10 Essential (primary) hypertension: Secondary | ICD-10-CM | POA: Diagnosis not present

## 2024-06-30 DIAGNOSIS — Z923 Personal history of irradiation: Secondary | ICD-10-CM | POA: Diagnosis not present

## 2024-06-30 DIAGNOSIS — R42 Dizziness and giddiness: Secondary | ICD-10-CM | POA: Diagnosis not present

## 2024-06-30 DIAGNOSIS — E119 Type 2 diabetes mellitus without complications: Secondary | ICD-10-CM | POA: Diagnosis not present

## 2024-06-30 DIAGNOSIS — Z7982 Long term (current) use of aspirin: Secondary | ICD-10-CM | POA: Diagnosis not present

## 2024-06-30 DIAGNOSIS — C7800 Secondary malignant neoplasm of unspecified lung: Secondary | ICD-10-CM | POA: Diagnosis not present

## 2024-06-30 DIAGNOSIS — E876 Hypokalemia: Secondary | ICD-10-CM | POA: Diagnosis not present

## 2024-06-30 DIAGNOSIS — C439 Malignant melanoma of skin, unspecified: Secondary | ICD-10-CM | POA: Diagnosis not present

## 2024-06-30 DIAGNOSIS — E785 Hyperlipidemia, unspecified: Secondary | ICD-10-CM | POA: Diagnosis not present

## 2024-07-01 ENCOUNTER — Other Ambulatory Visit: Payer: Self-pay | Admitting: Internal Medicine

## 2024-07-07 DIAGNOSIS — A000 Cholera due to Vibrio cholerae 01, biovar cholerae: Secondary | ICD-10-CM | POA: Diagnosis not present

## 2024-07-11 DIAGNOSIS — R42 Dizziness and giddiness: Secondary | ICD-10-CM | POA: Diagnosis not present

## 2024-07-11 DIAGNOSIS — C7800 Secondary malignant neoplasm of unspecified lung: Secondary | ICD-10-CM | POA: Diagnosis not present

## 2024-07-19 DIAGNOSIS — E1165 Type 2 diabetes mellitus with hyperglycemia: Secondary | ICD-10-CM | POA: Diagnosis not present

## 2024-07-21 ENCOUNTER — Telehealth: Payer: Self-pay

## 2024-07-21 NOTE — Telephone Encounter (Signed)
 Copied from CRM [id number]. Topic: Clinical - Prescription Issue >> Jul 21, 2024  3:06 PM Macario HERO wrote: Reason for CRM: Patient spouse called said due to insurance change, can patient switch to Lantus  instead of Tresiba .

## 2024-07-21 NOTE — Telephone Encounter (Signed)
 Copied from CRM #[id number]. Topic: Clinical - Medication Question >> Jul 21, 2024  2:49 PM Shereese L wrote: Reason for CRM: Patient is calling to get assistance with insulin  degludec (TRESIBA  FLEXTOUCH) 200 UNIT/ML FlexTouch Pen because the cost is to high and she stated that she use to deal with Rockie in the office to help lower the cost. Patient is requesting a call back

## 2024-07-22 MED ORDER — LANTUS SOLOSTAR 100 UNIT/ML ~~LOC~~ SOPN: 36.0000 [IU] | PEN_INJECTOR | Freq: Every day | SUBCUTANEOUS | 99 refills | Status: DC

## 2024-07-22 NOTE — Telephone Encounter (Signed)
 Pt has been notified.

## 2024-07-22 NOTE — Addendum Note (Signed)
 Addended by: MARYLYNN VERNEITA CROME on: 07/22/2024 01:55 PM   Modules accepted: Orders

## 2024-07-25 DIAGNOSIS — E785 Hyperlipidemia, unspecified: Secondary | ICD-10-CM | POA: Diagnosis not present

## 2024-07-25 DIAGNOSIS — Z9181 History of falling: Secondary | ICD-10-CM | POA: Diagnosis not present

## 2024-07-25 DIAGNOSIS — I10 Essential (primary) hypertension: Secondary | ICD-10-CM | POA: Diagnosis not present

## 2024-07-25 DIAGNOSIS — T451X5A Adverse effect of antineoplastic and immunosuppressive drugs, initial encounter: Secondary | ICD-10-CM | POA: Diagnosis not present

## 2024-07-25 DIAGNOSIS — Z794 Long term (current) use of insulin: Secondary | ICD-10-CM | POA: Diagnosis not present

## 2024-07-25 DIAGNOSIS — Z7951 Long term (current) use of inhaled steroids: Secondary | ICD-10-CM | POA: Diagnosis not present

## 2024-07-25 DIAGNOSIS — C4359 Malignant melanoma of other part of trunk: Secondary | ICD-10-CM | POA: Diagnosis not present

## 2024-07-25 DIAGNOSIS — Z7982 Long term (current) use of aspirin: Secondary | ICD-10-CM | POA: Diagnosis not present

## 2024-07-25 DIAGNOSIS — J449 Chronic obstructive pulmonary disease, unspecified: Secondary | ICD-10-CM | POA: Diagnosis not present

## 2024-07-25 DIAGNOSIS — C439 Malignant melanoma of skin, unspecified: Secondary | ICD-10-CM | POA: Diagnosis not present

## 2024-07-25 DIAGNOSIS — Z79899 Other long term (current) drug therapy: Secondary | ICD-10-CM | POA: Diagnosis not present

## 2024-07-25 DIAGNOSIS — C7800 Secondary malignant neoplasm of unspecified lung: Secondary | ICD-10-CM | POA: Diagnosis not present

## 2024-07-25 DIAGNOSIS — E119 Type 2 diabetes mellitus without complications: Secondary | ICD-10-CM | POA: Diagnosis not present

## 2024-07-25 DIAGNOSIS — R110 Nausea: Secondary | ICD-10-CM | POA: Diagnosis not present

## 2024-07-26 DIAGNOSIS — G839 Paralytic syndrome, unspecified: Secondary | ICD-10-CM | POA: Diagnosis not present

## 2024-07-26 NOTE — Telephone Encounter (Signed)
 open in error

## 2024-07-27 NOTE — Telephone Encounter (Unsigned)
 Copied from CRM [id number]. Topic: Clinical - Prescription Issue >> Jul 26, 2024  4:41 PM Nessti S wrote: Reason for CRM: pt called because pharmacy said that insulin  glargine (LANTUS  SOLOSTAR) 100 UNIT/ML Solostar Pen will be $70 for 42 days. It was called in for 36 units. Call back number [phone]

## 2024-07-29 MED ORDER — LANTUS SOLOSTAR 100 UNIT/ML ~~LOC~~ SOPN: 36.0000 [IU] | PEN_INJECTOR | Freq: Every day | SUBCUTANEOUS | 0 refills | Status: AC

## 2024-07-29 NOTE — Addendum Note (Signed)
 Addended by: Shequila Neglia on: 07/29/2024 01:22 PM   Modules accepted: Orders

## 2024-07-29 NOTE — Telephone Encounter (Signed)
 Spoke with pharmacist and she advised that the rx has be sent in as the 15 mL because they can not split a box so she asked that I send a rx for 30 ml as well. She stated that comes January they will run both quantities and see which one will be the better cost for the pt.

## 2024-08-07 DIAGNOSIS — A000 Cholera due to Vibrio cholerae 01, biovar cholerae: Secondary | ICD-10-CM | POA: Diagnosis not present

## 2024-08-08 ENCOUNTER — Telehealth: Payer: Self-pay | Admitting: Internal Medicine

## 2024-08-08 NOTE — Telephone Encounter (Signed)
 Pt husband dropped off [employer]  forms to be filled out. The forms has been placed in back mailbox.

## 2024-08-12 NOTE — Telephone Encounter (Signed)
Completed and placed in quick sign folder.  

## 2024-08-16 NOTE — Telephone Encounter (Signed)
 Form has been faxed.

## 2024-08-18 ENCOUNTER — Other Ambulatory Visit: Payer: Self-pay | Admitting: Internal Medicine

## 2024-08-18 DIAGNOSIS — C7800 Secondary malignant neoplasm of unspecified lung: Secondary | ICD-10-CM | POA: Diagnosis not present

## 2024-08-18 DIAGNOSIS — J9611 Chronic respiratory failure with hypoxia: Secondary | ICD-10-CM

## 2024-08-18 DIAGNOSIS — C439 Malignant melanoma of skin, unspecified: Secondary | ICD-10-CM | POA: Diagnosis not present

## 2024-08-18 DIAGNOSIS — Z5112 Encounter for antineoplastic immunotherapy: Secondary | ICD-10-CM | POA: Diagnosis not present

## 2024-08-18 DIAGNOSIS — C4359 Malignant melanoma of other part of trunk: Secondary | ICD-10-CM | POA: Diagnosis not present

## 2024-08-18 DIAGNOSIS — Z79899 Other long term (current) drug therapy: Secondary | ICD-10-CM | POA: Diagnosis not present

## 2024-08-25 ENCOUNTER — Telehealth: Payer: Self-pay | Admitting: Pharmacist

## 2024-08-25 DIAGNOSIS — G839 Paralytic syndrome, unspecified: Secondary | ICD-10-CM | POA: Diagnosis not present

## 2024-08-25 NOTE — Progress Notes (Unsigned)
 Brief Telephone Documentation Reason for Call: Patient assistance re-enrollment   Summary of Call: [place] submitted and approved 12/10. Approval through 09/07/2025 - BREZTRI    Called to confirm current dosing of insulin  for Novo re-enrollment. Unclear is re-enrollment started yet.  Left voicemail with my direct callback number for patient to return call at earliest convenience.

## 2024-09-07 ENCOUNTER — Other Ambulatory Visit: Payer: Self-pay | Admitting: Internal Medicine

## 2024-09-17 ENCOUNTER — Other Ambulatory Visit: Payer: Self-pay | Admitting: Internal Medicine

## 2024-09-17 DIAGNOSIS — E118 Type 2 diabetes mellitus with unspecified complications: Secondary | ICD-10-CM

## 2024-09-19 ENCOUNTER — Telehealth: Payer: Self-pay

## 2024-09-19 DIAGNOSIS — R052 Subacute cough: Secondary | ICD-10-CM

## 2024-09-19 NOTE — Telephone Encounter (Signed)
 Spoke with pt and she stated that she reached out to the cancer center as well they have her scheduled for an appt tomorrow to see doctor have labs and a chest xray done.

## 2024-09-19 NOTE — Telephone Encounter (Signed)
 Called pt to scheduled her for an appt due to being overdue. Pt scheduled an appt for next next 09/28/2024 at 2:30. While on the phone with pt she stated that she has developed her normal bronchitis and would like to know if something could be called in.

## 2024-09-19 NOTE — Addendum Note (Signed)
 Addended by: MARYLYNN VERNEITA CROME on: 09/19/2024 12:43 PM   Modules accepted: Orders

## 2024-09-28 ENCOUNTER — Ambulatory Visit: Admitting: Internal Medicine

## 2024-10-31 ENCOUNTER — Ambulatory Visit: Payer: Medicare HMO
